# Patient Record
Sex: Female | Born: 1975 | Race: White | Hispanic: No | Marital: Married | Smoking: Never smoker
Health system: Southern US, Community
[De-identification: ages and names within clinical notes are randomized; demographics above are authoritative.]

## PROBLEM LIST (undated history)

## (undated) ENCOUNTER — Inpatient Hospital Stay (HOSPITAL_COMMUNITY): Payer: Self-pay

## (undated) DIAGNOSIS — Z8719 Personal history of other diseases of the digestive system: Secondary | ICD-10-CM

## (undated) DIAGNOSIS — R079 Chest pain, unspecified: Secondary | ICD-10-CM

## (undated) DIAGNOSIS — O88219 Thromboembolism in pregnancy, unspecified trimester: Secondary | ICD-10-CM

## (undated) DIAGNOSIS — G901 Familial dysautonomia [Riley-Day]: Secondary | ICD-10-CM

## (undated) DIAGNOSIS — Z87442 Personal history of urinary calculi: Secondary | ICD-10-CM

## (undated) DIAGNOSIS — R42 Dizziness and giddiness: Secondary | ICD-10-CM

## (undated) DIAGNOSIS — Z9889 Other specified postprocedural states: Secondary | ICD-10-CM

## (undated) DIAGNOSIS — K5792 Diverticulitis of intestine, part unspecified, without perforation or abscess without bleeding: Secondary | ICD-10-CM

## (undated) DIAGNOSIS — R110 Nausea: Secondary | ICD-10-CM

## (undated) DIAGNOSIS — G43909 Migraine, unspecified, not intractable, without status migrainosus: Secondary | ICD-10-CM

## (undated) DIAGNOSIS — K5090 Crohn's disease, unspecified, without complications: Secondary | ICD-10-CM

## (undated) DIAGNOSIS — F419 Anxiety disorder, unspecified: Secondary | ICD-10-CM

## (undated) DIAGNOSIS — R002 Palpitations: Secondary | ICD-10-CM

## (undated) DIAGNOSIS — R112 Nausea with vomiting, unspecified: Secondary | ICD-10-CM

## (undated) DIAGNOSIS — K219 Gastro-esophageal reflux disease without esophagitis: Secondary | ICD-10-CM

## (undated) DIAGNOSIS — K599 Functional intestinal disorder, unspecified: Secondary | ICD-10-CM

## (undated) DIAGNOSIS — D649 Anemia, unspecified: Secondary | ICD-10-CM

## (undated) DIAGNOSIS — I10 Essential (primary) hypertension: Secondary | ICD-10-CM

## (undated) HISTORY — DX: Essential (primary) hypertension: I10

## (undated) HISTORY — PX: TONSILLECTOMY: SUR1361

## (undated) HISTORY — PX: ILEOCECETOMY: SHX5857

## (undated) HISTORY — PX: COLON SURGERY: SHX602

## (undated) HISTORY — DX: Diverticulitis of intestine, part unspecified, without perforation or abscess without bleeding: K57.92

## (undated) HISTORY — PX: NASAL SINUS SURGERY: SHX719

## (undated) HISTORY — PX: APPENDECTOMY: SHX54

## (undated) HISTORY — PX: CYSTOSCOPY W/ STONE MANIPULATION: SHX1427

## (undated) HISTORY — PX: CARDIAC CATHETERIZATION: SHX172

## (undated) HISTORY — DX: Functional intestinal disorder, unspecified: K59.9

## (undated) HISTORY — DX: Crohn's disease, unspecified, without complications: K50.90

## (undated) HISTORY — PX: KNEE ARTHROSCOPY: SHX127

## (undated) HISTORY — DX: Familial dysautonomia (riley-day): G90.1

## (undated) HISTORY — DX: Palpitations: R00.2

---

## 1998-12-24 DIAGNOSIS — K5090 Crohn's disease, unspecified, without complications: Secondary | ICD-10-CM

## 1998-12-24 HISTORY — DX: Crohn's disease, unspecified, without complications: K50.90

## 1999-05-18 ENCOUNTER — Other Ambulatory Visit: Admission: RE | Admit: 1999-05-18 | Discharge: 1999-05-18 | Payer: Self-pay | Admitting: Obstetrics and Gynecology

## 2000-11-25 ENCOUNTER — Other Ambulatory Visit: Admission: RE | Admit: 2000-11-25 | Discharge: 2000-11-25 | Payer: Self-pay | Admitting: Obstetrics and Gynecology

## 2000-11-28 ENCOUNTER — Observation Stay (HOSPITAL_COMMUNITY): Admission: RE | Admit: 2000-11-28 | Discharge: 2000-11-29 | Payer: Self-pay | Admitting: Obstetrics and Gynecology

## 2000-12-24 HISTORY — PX: PARTIAL COLECTOMY: SHX5273

## 2001-03-05 ENCOUNTER — Encounter (INDEPENDENT_AMBULATORY_CARE_PROVIDER_SITE_OTHER): Payer: Self-pay | Admitting: Specialist

## 2001-03-05 ENCOUNTER — Ambulatory Visit (HOSPITAL_COMMUNITY): Admission: RE | Admit: 2001-03-05 | Discharge: 2001-03-05 | Payer: Self-pay | Admitting: Gastroenterology

## 2001-03-24 ENCOUNTER — Encounter (HOSPITAL_COMMUNITY): Admission: RE | Admit: 2001-03-24 | Discharge: 2001-06-22 | Payer: Self-pay | Admitting: Gastroenterology

## 2001-04-07 ENCOUNTER — Encounter: Payer: Self-pay | Admitting: Gastroenterology

## 2001-04-07 ENCOUNTER — Ambulatory Visit (HOSPITAL_COMMUNITY): Admission: RE | Admit: 2001-04-07 | Discharge: 2001-04-07 | Payer: Self-pay | Admitting: Gastroenterology

## 2001-04-15 ENCOUNTER — Encounter (INDEPENDENT_AMBULATORY_CARE_PROVIDER_SITE_OTHER): Payer: Self-pay | Admitting: Specialist

## 2001-04-15 ENCOUNTER — Inpatient Hospital Stay (HOSPITAL_COMMUNITY): Admission: RE | Admit: 2001-04-15 | Discharge: 2001-04-25 | Payer: Self-pay | Admitting: Gastroenterology

## 2001-04-17 ENCOUNTER — Encounter: Payer: Self-pay | Admitting: Surgery

## 2001-04-28 ENCOUNTER — Encounter: Admission: RE | Admit: 2001-04-28 | Discharge: 2001-04-28 | Payer: Self-pay | Admitting: Surgery

## 2001-04-28 ENCOUNTER — Encounter: Payer: Self-pay | Admitting: Surgery

## 2001-04-28 ENCOUNTER — Emergency Department (HOSPITAL_COMMUNITY): Admission: EM | Admit: 2001-04-28 | Discharge: 2001-04-28 | Payer: Self-pay | Admitting: Emergency Medicine

## 2001-06-21 ENCOUNTER — Inpatient Hospital Stay (HOSPITAL_COMMUNITY): Admission: RE | Admit: 2001-06-21 | Discharge: 2001-06-27 | Payer: Self-pay | Admitting: Gastroenterology

## 2001-06-22 ENCOUNTER — Encounter: Payer: Self-pay | Admitting: Gastroenterology

## 2001-06-24 ENCOUNTER — Encounter: Payer: Self-pay | Admitting: Gastroenterology

## 2001-06-27 ENCOUNTER — Encounter: Payer: Self-pay | Admitting: Gastroenterology

## 2001-08-14 ENCOUNTER — Ambulatory Visit (HOSPITAL_COMMUNITY): Admission: RE | Admit: 2001-08-14 | Discharge: 2001-08-14 | Payer: Self-pay | Admitting: Obstetrics and Gynecology

## 2001-08-14 ENCOUNTER — Encounter: Payer: Self-pay | Admitting: Obstetrics and Gynecology

## 2001-08-17 ENCOUNTER — Encounter: Payer: Self-pay | Admitting: Emergency Medicine

## 2001-08-17 ENCOUNTER — Emergency Department (HOSPITAL_COMMUNITY): Admission: EM | Admit: 2001-08-17 | Discharge: 2001-08-17 | Payer: Self-pay | Admitting: Emergency Medicine

## 2001-09-11 ENCOUNTER — Ambulatory Visit (HOSPITAL_COMMUNITY): Admission: RE | Admit: 2001-09-11 | Discharge: 2001-09-11 | Payer: Self-pay | Admitting: Gastroenterology

## 2001-09-15 ENCOUNTER — Emergency Department (HOSPITAL_COMMUNITY): Admission: EM | Admit: 2001-09-15 | Discharge: 2001-09-15 | Payer: Self-pay | Admitting: Emergency Medicine

## 2001-09-23 ENCOUNTER — Encounter (INDEPENDENT_AMBULATORY_CARE_PROVIDER_SITE_OTHER): Payer: Self-pay | Admitting: Specialist

## 2001-09-23 ENCOUNTER — Inpatient Hospital Stay (HOSPITAL_COMMUNITY): Admission: RE | Admit: 2001-09-23 | Discharge: 2001-10-01 | Payer: Self-pay

## 2002-01-04 ENCOUNTER — Emergency Department (HOSPITAL_COMMUNITY): Admission: EM | Admit: 2002-01-04 | Discharge: 2002-01-05 | Payer: Self-pay | Admitting: Emergency Medicine

## 2002-01-04 ENCOUNTER — Encounter: Payer: Self-pay | Admitting: Emergency Medicine

## 2002-10-02 ENCOUNTER — Other Ambulatory Visit: Admission: RE | Admit: 2002-10-02 | Discharge: 2002-10-02 | Payer: Self-pay | Admitting: Obstetrics and Gynecology

## 2003-02-24 ENCOUNTER — Ambulatory Visit (HOSPITAL_COMMUNITY): Admission: RE | Admit: 2003-02-24 | Discharge: 2003-02-24 | Payer: Self-pay | Admitting: Gastroenterology

## 2003-03-03 ENCOUNTER — Inpatient Hospital Stay (HOSPITAL_COMMUNITY): Admission: EM | Admit: 2003-03-03 | Discharge: 2003-03-06 | Payer: Self-pay | Admitting: Emergency Medicine

## 2003-03-03 ENCOUNTER — Encounter: Payer: Self-pay | Admitting: Internal Medicine

## 2003-03-04 ENCOUNTER — Encounter: Payer: Self-pay | Admitting: Internal Medicine

## 2003-03-12 ENCOUNTER — Encounter: Payer: Self-pay | Admitting: Internal Medicine

## 2003-03-12 ENCOUNTER — Ambulatory Visit (HOSPITAL_COMMUNITY): Admission: RE | Admit: 2003-03-12 | Discharge: 2003-03-12 | Payer: Self-pay | Admitting: Internal Medicine

## 2003-04-07 ENCOUNTER — Ambulatory Visit (HOSPITAL_COMMUNITY): Admission: RE | Admit: 2003-04-07 | Discharge: 2003-04-07 | Payer: Self-pay | Admitting: *Deleted

## 2003-05-13 ENCOUNTER — Encounter: Payer: Self-pay | Admitting: Gastroenterology

## 2003-05-13 ENCOUNTER — Encounter: Admission: RE | Admit: 2003-05-13 | Discharge: 2003-05-13 | Payer: Self-pay | Admitting: Gastroenterology

## 2003-05-24 ENCOUNTER — Encounter: Payer: Self-pay | Admitting: Emergency Medicine

## 2003-05-24 ENCOUNTER — Emergency Department (HOSPITAL_COMMUNITY): Admission: EM | Admit: 2003-05-24 | Discharge: 2003-05-24 | Payer: Self-pay | Admitting: Emergency Medicine

## 2003-05-25 ENCOUNTER — Inpatient Hospital Stay (HOSPITAL_COMMUNITY): Admission: EM | Admit: 2003-05-25 | Discharge: 2003-05-27 | Payer: Self-pay | Admitting: *Deleted

## 2003-05-25 ENCOUNTER — Encounter: Payer: Self-pay | Admitting: Emergency Medicine

## 2003-05-25 ENCOUNTER — Encounter: Payer: Self-pay | Admitting: Gastroenterology

## 2003-06-02 ENCOUNTER — Encounter: Payer: Self-pay | Admitting: Urology

## 2003-06-02 ENCOUNTER — Ambulatory Visit (HOSPITAL_BASED_OUTPATIENT_CLINIC_OR_DEPARTMENT_OTHER): Admission: RE | Admit: 2003-06-02 | Discharge: 2003-06-02 | Payer: Self-pay | Admitting: Urology

## 2003-06-05 ENCOUNTER — Encounter: Payer: Self-pay | Admitting: Emergency Medicine

## 2003-06-05 ENCOUNTER — Observation Stay (HOSPITAL_COMMUNITY): Admission: EM | Admit: 2003-06-05 | Discharge: 2003-06-06 | Payer: Self-pay | Admitting: Emergency Medicine

## 2003-06-16 ENCOUNTER — Encounter (HOSPITAL_COMMUNITY): Admission: RE | Admit: 2003-06-16 | Discharge: 2003-09-14 | Payer: Self-pay | Admitting: Gastroenterology

## 2003-06-18 ENCOUNTER — Inpatient Hospital Stay (HOSPITAL_COMMUNITY): Admission: EM | Admit: 2003-06-18 | Discharge: 2003-06-20 | Payer: Self-pay | Admitting: Emergency Medicine

## 2003-06-18 ENCOUNTER — Encounter: Payer: Self-pay | Admitting: *Deleted

## 2004-01-06 ENCOUNTER — Encounter: Admission: RE | Admit: 2004-01-06 | Discharge: 2004-01-06 | Payer: Self-pay | Admitting: Gastroenterology

## 2004-01-21 ENCOUNTER — Other Ambulatory Visit: Admission: RE | Admit: 2004-01-21 | Discharge: 2004-01-21 | Payer: Self-pay | Admitting: Obstetrics and Gynecology

## 2004-02-22 ENCOUNTER — Encounter: Admission: RE | Admit: 2004-02-22 | Discharge: 2004-02-22 | Payer: Self-pay | Admitting: Obstetrics and Gynecology

## 2004-05-24 ENCOUNTER — Encounter: Admission: RE | Admit: 2004-05-24 | Discharge: 2004-05-24 | Payer: Self-pay | Admitting: Gastroenterology

## 2004-12-12 ENCOUNTER — Emergency Department (HOSPITAL_COMMUNITY): Admission: EM | Admit: 2004-12-12 | Discharge: 2004-12-12 | Payer: Self-pay | Admitting: Emergency Medicine

## 2005-04-05 ENCOUNTER — Other Ambulatory Visit: Admission: RE | Admit: 2005-04-05 | Discharge: 2005-04-05 | Payer: Self-pay | Admitting: Obstetrics and Gynecology

## 2005-04-24 ENCOUNTER — Ambulatory Visit (HOSPITAL_COMMUNITY): Admission: RE | Admit: 2005-04-24 | Discharge: 2005-04-24 | Payer: Self-pay | Admitting: Internal Medicine

## 2005-07-05 ENCOUNTER — Inpatient Hospital Stay (HOSPITAL_COMMUNITY): Admission: EM | Admit: 2005-07-05 | Discharge: 2005-07-10 | Payer: Self-pay | Admitting: Emergency Medicine

## 2006-04-09 ENCOUNTER — Encounter: Admission: RE | Admit: 2006-04-09 | Discharge: 2006-04-09 | Payer: Self-pay | Admitting: Gastroenterology

## 2006-04-30 ENCOUNTER — Encounter: Admission: RE | Admit: 2006-04-30 | Discharge: 2006-04-30 | Payer: Self-pay | Admitting: Gastroenterology

## 2007-01-24 ENCOUNTER — Ambulatory Visit (HOSPITAL_COMMUNITY): Admission: RE | Admit: 2007-01-24 | Discharge: 2007-01-24 | Payer: Self-pay | Admitting: Orthopedic Surgery

## 2007-08-31 ENCOUNTER — Inpatient Hospital Stay (HOSPITAL_COMMUNITY): Admission: EM | Admit: 2007-08-31 | Discharge: 2007-09-04 | Payer: Self-pay | Admitting: Emergency Medicine

## 2007-09-03 ENCOUNTER — Encounter (INDEPENDENT_AMBULATORY_CARE_PROVIDER_SITE_OTHER): Payer: Self-pay | Admitting: Gastroenterology

## 2007-10-20 ENCOUNTER — Encounter: Admission: RE | Admit: 2007-10-20 | Discharge: 2007-10-20 | Payer: Self-pay | Admitting: Gastroenterology

## 2007-10-30 ENCOUNTER — Encounter: Admission: RE | Admit: 2007-10-30 | Discharge: 2007-10-30 | Payer: Self-pay | Admitting: Gastroenterology

## 2008-01-19 ENCOUNTER — Ambulatory Visit (HOSPITAL_COMMUNITY): Admission: RE | Admit: 2008-01-19 | Discharge: 2008-01-19 | Payer: Self-pay | Admitting: Neurology

## 2008-01-22 ENCOUNTER — Encounter: Admission: RE | Admit: 2008-01-22 | Discharge: 2008-01-22 | Payer: Self-pay | Admitting: Neurology

## 2008-01-28 ENCOUNTER — Encounter: Admission: RE | Admit: 2008-01-28 | Discharge: 2008-01-28 | Payer: Self-pay | Admitting: Neurology

## 2008-03-10 ENCOUNTER — Encounter: Admission: RE | Admit: 2008-03-10 | Discharge: 2008-03-10 | Payer: Self-pay | Admitting: Neurology

## 2008-06-13 ENCOUNTER — Encounter: Admission: RE | Admit: 2008-06-13 | Discharge: 2008-06-13 | Payer: Self-pay | Admitting: Neurology

## 2008-06-30 ENCOUNTER — Encounter: Admission: RE | Admit: 2008-06-30 | Discharge: 2008-06-30 | Payer: Self-pay | Admitting: Orthopedic Surgery

## 2009-06-09 ENCOUNTER — Encounter: Admission: RE | Admit: 2009-06-09 | Discharge: 2009-06-09 | Payer: Self-pay | Admitting: Internal Medicine

## 2009-08-14 ENCOUNTER — Emergency Department (HOSPITAL_COMMUNITY): Admission: EM | Admit: 2009-08-14 | Discharge: 2009-08-14 | Payer: Self-pay | Admitting: Emergency Medicine

## 2009-08-15 ENCOUNTER — Emergency Department (HOSPITAL_COMMUNITY): Admission: EM | Admit: 2009-08-15 | Discharge: 2009-08-16 | Payer: Self-pay | Admitting: Emergency Medicine

## 2009-08-15 ENCOUNTER — Ambulatory Visit (HOSPITAL_COMMUNITY): Admission: RE | Admit: 2009-08-15 | Discharge: 2009-08-15 | Payer: Self-pay | Admitting: Gastroenterology

## 2009-08-17 ENCOUNTER — Ambulatory Visit (HOSPITAL_COMMUNITY): Admission: RE | Admit: 2009-08-17 | Discharge: 2009-08-17 | Payer: Self-pay | Admitting: Emergency Medicine

## 2009-11-11 HISTORY — PX: US ECHOCARDIOGRAPHY: HXRAD669

## 2009-11-23 HISTORY — PX: LOOP RECORDER IMPLANT: SHX5954

## 2009-12-01 ENCOUNTER — Ambulatory Visit (HOSPITAL_COMMUNITY): Admission: RE | Admit: 2009-12-01 | Discharge: 2009-12-01 | Payer: Self-pay | Admitting: Neurology

## 2009-12-07 ENCOUNTER — Encounter: Admission: RE | Admit: 2009-12-07 | Discharge: 2009-12-07 | Payer: Self-pay | Admitting: Neurology

## 2009-12-13 ENCOUNTER — Ambulatory Visit (HOSPITAL_COMMUNITY): Admission: RE | Admit: 2009-12-13 | Discharge: 2009-12-13 | Payer: Self-pay | Admitting: Cardiology

## 2010-02-07 ENCOUNTER — Inpatient Hospital Stay (HOSPITAL_COMMUNITY): Admission: AD | Admit: 2010-02-07 | Discharge: 2010-02-11 | Payer: Self-pay | Admitting: Obstetrics and Gynecology

## 2010-02-22 ENCOUNTER — Inpatient Hospital Stay (HOSPITAL_COMMUNITY): Admission: AD | Admit: 2010-02-22 | Discharge: 2010-02-22 | Payer: Self-pay | Admitting: Obstetrics and Gynecology

## 2010-02-25 ENCOUNTER — Inpatient Hospital Stay (HOSPITAL_COMMUNITY): Admission: AD | Admit: 2010-02-25 | Discharge: 2010-02-25 | Payer: Self-pay | Admitting: Obstetrics & Gynecology

## 2010-03-24 ENCOUNTER — Ambulatory Visit (HOSPITAL_COMMUNITY): Admission: RE | Admit: 2010-03-24 | Discharge: 2010-03-24 | Payer: Self-pay | Admitting: Obstetrics and Gynecology

## 2010-04-19 ENCOUNTER — Ambulatory Visit (HOSPITAL_COMMUNITY): Admission: RE | Admit: 2010-04-19 | Discharge: 2010-04-19 | Payer: Self-pay | Admitting: Obstetrics and Gynecology

## 2010-05-26 ENCOUNTER — Emergency Department (HOSPITAL_COMMUNITY): Admission: EM | Admit: 2010-05-26 | Discharge: 2010-05-26 | Payer: Self-pay | Admitting: Emergency Medicine

## 2010-06-07 ENCOUNTER — Ambulatory Visit (HOSPITAL_COMMUNITY): Admission: RE | Admit: 2010-06-07 | Discharge: 2010-06-07 | Payer: Self-pay | Admitting: Obstetrics & Gynecology

## 2010-06-09 ENCOUNTER — Ambulatory Visit (HOSPITAL_COMMUNITY): Admission: RE | Admit: 2010-06-09 | Discharge: 2010-06-09 | Payer: Self-pay | Admitting: Obstetrics & Gynecology

## 2010-06-13 ENCOUNTER — Inpatient Hospital Stay (HOSPITAL_COMMUNITY): Admission: AD | Admit: 2010-06-13 | Discharge: 2010-07-21 | Payer: Self-pay | Admitting: Obstetrics and Gynecology

## 2010-06-15 ENCOUNTER — Encounter: Payer: Self-pay | Admitting: Obstetrics & Gynecology

## 2010-06-16 ENCOUNTER — Encounter: Payer: Self-pay | Admitting: Obstetrics & Gynecology

## 2010-06-19 ENCOUNTER — Encounter: Payer: Self-pay | Admitting: Obstetrics and Gynecology

## 2010-06-21 ENCOUNTER — Encounter: Payer: Self-pay | Admitting: Obstetrics and Gynecology

## 2010-06-23 ENCOUNTER — Encounter: Payer: Self-pay | Admitting: Obstetrics and Gynecology

## 2010-06-27 ENCOUNTER — Encounter: Payer: Self-pay | Admitting: Obstetrics and Gynecology

## 2010-06-30 ENCOUNTER — Encounter: Payer: Self-pay | Admitting: Obstetrics and Gynecology

## 2010-07-04 ENCOUNTER — Encounter: Payer: Self-pay | Admitting: Obstetrics and Gynecology

## 2010-07-07 ENCOUNTER — Encounter: Payer: Self-pay | Admitting: Obstetrics and Gynecology

## 2010-07-11 ENCOUNTER — Encounter: Payer: Self-pay | Admitting: Obstetrics and Gynecology

## 2010-07-14 ENCOUNTER — Encounter: Payer: Self-pay | Admitting: Obstetrics and Gynecology

## 2010-07-18 ENCOUNTER — Encounter: Payer: Self-pay | Admitting: Obstetrics and Gynecology

## 2010-07-21 ENCOUNTER — Encounter: Payer: Self-pay | Admitting: Obstetrics & Gynecology

## 2010-08-17 ENCOUNTER — Ambulatory Visit: Payer: Self-pay | Admitting: Cardiovascular Disease

## 2010-10-02 ENCOUNTER — Ambulatory Visit: Admission: RE | Admit: 2010-10-02 | Discharge: 2010-10-02 | Payer: Self-pay | Admitting: Obstetrics and Gynecology

## 2010-11-10 ENCOUNTER — Ambulatory Visit: Payer: Self-pay | Admitting: Internal Medicine

## 2010-11-10 DIAGNOSIS — R55 Syncope and collapse: Secondary | ICD-10-CM | POA: Insufficient documentation

## 2010-12-24 HISTORY — PX: ENDOSCOPIC RELEASE TRANSVERSE CARPAL LIGAMENT OF HAND: SUR444

## 2011-01-02 ENCOUNTER — Encounter: Payer: Self-pay | Admitting: Internal Medicine

## 2011-01-02 ENCOUNTER — Ambulatory Visit: Admission: RE | Admit: 2011-01-02 | Discharge: 2011-01-02 | Payer: Self-pay | Source: Home / Self Care

## 2011-01-14 ENCOUNTER — Encounter: Payer: Self-pay | Admitting: Obstetrics & Gynecology

## 2011-01-14 ENCOUNTER — Encounter: Payer: Self-pay | Admitting: Obstetrics and Gynecology

## 2011-01-23 NOTE — Cardiovascular Report (Signed)
Summary: Office Visit   Office Visit   Imported By: Sallee Provencal 11/10/2010 16:40:25  _____________________________________________________________________  External Attachment:    Type:   Image     Comment:   External Document

## 2011-01-23 NOTE — Assessment & Plan Note (Signed)
Summary: ILR   Visit Type:  New Patient Referring Provider:  Dr Acie Fredrickson   History of Present Illness: [name] is a pleasant 35 yo WF with recurrent unexplained syncope who presents today to establish EP follow-up of her ILR implanted by Dr Doreatha Lew 12-12-23.  She reports prior episodes of syncope but none since her monitor has been implanted.  She does not recall triggers for her syncope.  She has found that she typically has a prodrome of feeling "weak" and that if she sits down and puts her head between her legs that episodes will typically pass.  She denies abrupt LOC.  She also denies symptoms of palpitations, chest pain, shortness of breath, orthopnea, PND, lower extremity edema, dizziness, or neurologic sequela.  She was pregnant during this year and predominantly had difficulty with hyperemesis.  This has resolved since delivery.  The patient is tolerating medications without difficulties and is otherwise without complaint today.   Current Medications (verified): 1)  Prenatabs Fa  Tabs (Prenatal Vit-Fe Fumarate-Fa) .... 2 Qhs 2)  Ambien 10 Mg Tabs (Zolpidem Tartrate) .... At Bedtime 3)  Zyrtec Hives Relief 10 Mg Tabs (Cetirizine Hcl) .... At Bedtime 4)  Pepcid Ac 10 Mg Tabs (Famotidine) .... At Bedtime  Allergies: 1)  ! Sulfa 2)  ! Codeine 3)  ! Phenergan 4)  ! Biaxin  Past History:  Past Medical History:  Asthma  Endometriosis  History of Crohn disease since 2000.  She has had surgical resection x2.   Question of a seizure disorder, but with recent negative EEG.   Seasonal allergies  Recurrent unexplained syncope s/p ILR  Past Surgical History: Cysto, retrograde pyelogram, left ureteroscopy with removal of stone and insertion of left ureteral stent. ileocolectomy ILR by Dr Doreatha Lew 12/12/2023  Family History: Father died at 55 due to a heart attack.  He did have a history of diabetes.    Her mother is 77 and has hypertension.   Social History: Reviewed history from 11/09/2010  and no changes required. She is married.   She was formally a Education officer, museum.  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   She does not smoke.   She does have occasional wine.   Review of Systems       All systems are reviewed and negative except as listed in the HPI.   Vital Signs:  Patient profile:   35 year old female Height:      64 inches Weight:      140 pounds BMI:     24.12 Pulse rate:   56 / minute BP sitting:   115 / 83  (left arm)  Vitals Entered By: [name] CMA (November 10, 2010 10:28 AM)  Physical Exam  General:  Well developed, well nourished, in no acute distress. Head:  normocephalic and atraumatic Eyes:  PERRLA/EOM intact; conjunctiva and lids normal. Mouth:  Teeth, gums and palate normal. Oral mucosa normal. Neck:  Neck supple, no JVD. No masses, thyromegaly or abnormal cervical nodes. Chest Wall:  ILR pocket is well healed Lungs:  Clear bilaterally to auscultation and percussion. Heart:  Non-displaced PMI, chest non-tender; regular rate and rhythm, S1, S2 without murmurs, rubs or gallops. Carotid upstroke normal, no bruit. Normal abdominal aortic size, no bruits. Femorals normal pulses, no bruits. Pedals normal pulses. No edema, no varicosities. Abdomen:  Bowel sounds positive; abdomen soft and non-tender without masses, organomegaly, or hernias noted. No hepatosplenomegaly. Msk:  R wrist in a soft brace (tendonitis  per pt) Extremities:  No clubbing or cyanosis. Neurologic:  Alert and oriented x 3.   EKG  Procedure date:  11/10/2010  Findings:      sinus rhythm 84 bpm short PR (PR 106) without obvious pre-excitation QTc 458 otherwise normal ekg   ILR Following MD Thompson Grayer, MD Vendor:  [place] Number:  [phone]     Serial Number [id number]       Tachy Episodes:  5     Brady Episodes:  0 ILR Next Due 01/24/2011  Tech Comments:  VT episodes probable sinus tachy.  ROV 3 months clinic. Alma Friendly, LPN   November 10, 1517 10:37 AM   MD Comments:  agree,  she had several episodes of narrow complex tachycardia (HR 180s) with QRS morphology similar to sinus, without symptoms to correlate.  This could be sinus tachy or SVT.  Impression & Recommendations:  Problem # 1:  SYNCOPE, HX OF (ICD-V12.49) By history,  she likely has dysautonomia as the cause for her syncope.  She has had no further episodes with lifestyle modification.  Her ILR today reveals either sinus tach or SVT but no worrisome episodes.  No changes are therefore made today. She will follow-up with Dr Acie Fredrickson and in the device clinic. Adequate hydration was encouraged today.

## 2011-01-23 NOTE — Assessment & Plan Note (Signed)
    Patient Instructions: 1)  Your physician recommends that you schedule a follow-up appointment in: 3 months in the device clinic   Appended Document:  short PR on ecg without obvious pre-excitation we will follow ILR

## 2011-01-25 NOTE — Procedures (Signed)
Summary: device check/sl   Current Medications (verified): 1)  Prenatabs Fa  Tabs (Prenatal Vit-Fe Fumarate-Fa) .... 2 Qhs 2)  Ambien 10 Mg Tabs (Zolpidem Tartrate) .... At Bedtime 3)  Zyrtec Hives Relief 10 Mg Tabs (Cetirizine Hcl) .... At Bedtime 4)  Pepcid Ac 10 Mg Tabs (Famotidine) .... At Bedtime  Allergies (verified): 1)  ! Sulfa 2)  ! Codeine 3)  ! Phenergan 4)  ! Biaxin    ILR Following MD Thompson Grayer, MD Vendor:  [employer]     Model Number:  [id number]     Serial Number [id number]       Tachy Episodes:  0     Brady Episodes:  0  Tech Comments:  1 patient activated episode recorded after near syncopal episode, heart rate of 140bpm noted.  Will follow up as schedulled. Alma Friendly, LPN  January 03, 220 10:18 AM   MD Comments:  appers to be sinus tach

## 2011-01-25 NOTE — Cardiovascular Report (Signed)
Summary: Office Visit   Office Visit   Imported By: Sallee Provencal 01/15/2011 11:01:33  _____________________________________________________________________  External Attachment:    Type:   Image     Comment:   External Document

## 2011-03-10 LAB — LACTATE DEHYDROGENASE: LDH: 122 U/L (ref 94–250)

## 2011-03-10 LAB — CBC
HCT: 31.3 % — ABNORMAL LOW (ref 36.0–46.0)
Hemoglobin: 10.6 g/dL — ABNORMAL LOW (ref 12.0–15.0)
MCH: 30.5 pg (ref 26.0–34.0)
MCHC: 33.9 g/dL (ref 30.0–36.0)

## 2011-03-10 LAB — HEMOGLOBIN A1C

## 2011-03-10 LAB — URINALYSIS, DIPSTICK ONLY
Bilirubin Urine: NEGATIVE
Leukocytes, UA: NEGATIVE
Nitrite: NEGATIVE
Specific Gravity, Urine: >1.03 — ABNORMAL HIGH (ref 1.005–1.030)
pH: 6 (ref 5.0–8.0)

## 2011-03-10 LAB — COMPREHENSIVE METABOLIC PANEL
BUN: 6 mg/dL (ref 6–23)
Calcium: 8.4 mg/dL (ref 8.4–10.5)
Glucose, Bld: 81 mg/dL (ref 70–99)
Total Protein: 5.5 g/dL — ABNORMAL LOW (ref 6.0–8.3)

## 2011-03-10 LAB — URINALYSIS, ROUTINE W REFLEX MICROSCOPIC
Bilirubin Urine: NEGATIVE
Glucose, UA: NEGATIVE mg/dL
Ketones, ur: NEGATIVE mg/dL
Nitrite: NEGATIVE
pH: 7.5 (ref 5.0–8.0)

## 2011-03-10 LAB — URIC ACID: Uric Acid, Serum: 4.5 mg/dL (ref 2.4–7.0)

## 2011-03-11 LAB — BASIC METABOLIC PANEL
Calcium: 8.5 mg/dL (ref 8.4–10.5)
GFR calc Af Amer: >60 mL/min (ref >60)
GFR calc non Af Amer: >60 mL/min (ref >60)
Glucose, Bld: 97 mg/dL (ref 70–99)
Potassium: 3.7 mEq/L (ref 3.5–5.1)
Sodium: 134 mEq/L — ABNORMAL LOW (ref 135–145)

## 2011-03-11 LAB — URINALYSIS, ROUTINE W REFLEX MICROSCOPIC
Nitrite: NEGATIVE
Specific Gravity, Urine: 1.01 (ref 1.005–1.030)
Urobilinogen, UA: 0.2 mg/dL (ref 0.0–1.0)
pH: 6.5 (ref 5.0–8.0)

## 2011-03-11 LAB — STREP B DNA PROBE: Strep Group B Ag: POSITIVE

## 2011-03-14 LAB — URINALYSIS, ROUTINE W REFLEX MICROSCOPIC
Bilirubin Urine: NEGATIVE
Glucose, UA: NEGATIVE mg/dL
Hgb urine dipstick: NEGATIVE
Protein, ur: NEGATIVE mg/dL
Urobilinogen, UA: 0.2 mg/dL (ref 0.0–1.0)

## 2011-03-14 LAB — COMPREHENSIVE METABOLIC PANEL
ALT: 36 U/L — ABNORMAL HIGH (ref 0–35)
ALT: 44 U/L — ABNORMAL HIGH (ref 0–35)
ALT: 56 U/L — ABNORMAL HIGH (ref 0–35)
AST: 24 U/L (ref 0–37)
Albumin: 2.6 g/dL — ABNORMAL LOW (ref 3.5–5.2)
Albumin: 2.7 g/dL — ABNORMAL LOW (ref 3.5–5.2)
Albumin: 3.5 g/dL (ref 3.5–5.2)
Alkaline Phosphatase: 51 U/L (ref 39–117)
Alkaline Phosphatase: 58 U/L (ref 39–117)
Alkaline Phosphatase: 70 U/L (ref 39–117)
BUN: 6 mg/dL (ref 6–23)
CO2: 22 mEq/L (ref 19–32)
CO2: 23 mEq/L (ref 19–32)
Chloride: 108 mEq/L (ref 96–112)
Chloride: 110 mEq/L (ref 96–112)
Creatinine, Ser: 0.6 mg/dL (ref 0.4–1.2)
GFR calc Af Amer: >60 mL/min (ref >60)
GFR calc Af Amer: >60 mL/min (ref >60)
Glucose, Bld: 81 mg/dL (ref 70–99)
Potassium: 3.5 mEq/L (ref 3.5–5.1)
Potassium: 3.9 mEq/L (ref 3.5–5.1)
Sodium: 135 mEq/L (ref 135–145)
Sodium: 136 mEq/L (ref 135–145)
Total Bilirubin: 0.6 mg/dL (ref 0.3–1.2)
Total Bilirubin: 0.6 mg/dL (ref 0.3–1.2)
Total Bilirubin: 0.6 mg/dL (ref 0.3–1.2)
Total Protein: 6.3 g/dL (ref 6.0–8.3)

## 2011-03-14 LAB — DIFFERENTIAL
Basophils Absolute: 0 10*3/uL (ref 0.0–0.1)
Basophils Absolute: 0 10*3/uL (ref 0.0–0.1)
Basophils Relative: 0 % (ref 0–1)
Basophils Relative: 0 % (ref 0–1)
Eosinophils Absolute: 0 10*3/uL (ref 0.0–0.7)
Eosinophils Absolute: 0 10*3/uL (ref 0.0–0.7)
Eosinophils Relative: 0 % (ref 0–5)
Monocytes Absolute: 0.4 10*3/uL (ref 0.1–1.0)
Monocytes Absolute: 0.8 10*3/uL (ref 0.1–1.0)
Monocytes Relative: 6 % (ref 3–12)
Neutro Abs: 6.3 10*3/uL (ref 1.7–7.7)

## 2011-03-14 LAB — CBC
HCT: 37 % (ref 36.0–46.0)
Hemoglobin: 12.5 g/dL (ref 12.0–15.0)
MCHC: 33.8 g/dL (ref 30.0–36.0)
MCV: 89.4 fL (ref 78.0–100.0)
Platelets: 300 10*3/uL (ref 150–400)
RBC: 3.65 MIL/uL — ABNORMAL LOW (ref 3.87–5.11)
RDW: 12.8 % (ref 11.5–15.5)
RDW: 12.9 % (ref 11.5–15.5)
WBC: 12.7 10*3/uL — ABNORMAL HIGH (ref 4.0–10.5)

## 2011-03-14 LAB — URINE MICROSCOPIC-ADD ON

## 2011-03-14 LAB — AMYLASE: Amylase: 27 U/L (ref 0–105)

## 2011-03-16 LAB — COMPREHENSIVE METABOLIC PANEL
ALT: 53 U/L — ABNORMAL HIGH (ref 0–35)
AST: 31 U/L (ref 0–37)
Alkaline Phosphatase: 88 U/L (ref 39–117)
CO2: 25 mEq/L (ref 19–32)
Calcium: 8.9 mg/dL (ref 8.4–10.5)
GFR calc Af Amer: >60 mL/min (ref >60)
GFR calc non Af Amer: >60 mL/min (ref >60)
Glucose, Bld: 79 mg/dL (ref 70–99)
Potassium: 3.8 mEq/L (ref 3.5–5.1)
Sodium: 135 mEq/L (ref 135–145)

## 2011-03-16 LAB — URINALYSIS, ROUTINE W REFLEX MICROSCOPIC
Bilirubin Urine: NEGATIVE
Glucose, UA: NEGATIVE mg/dL
Hgb urine dipstick: NEGATIVE
Ketones, ur: 40 mg/dL — AB
Nitrite: NEGATIVE
Protein, ur: 30 mg/dL — AB
Specific Gravity, Urine: >1.03 — ABNORMAL HIGH (ref 1.005–1.030)
Specific Gravity, Urine: >1.03 — ABNORMAL HIGH (ref 1.005–1.030)
Urobilinogen, UA: 0.2 mg/dL (ref 0.0–1.0)
pH: 5.5 (ref 5.0–8.0)

## 2011-03-16 LAB — URINE MICROSCOPIC-ADD ON

## 2011-03-21 ENCOUNTER — Other Ambulatory Visit: Payer: Self-pay | Admitting: Gastroenterology

## 2011-03-21 DIAGNOSIS — K5090 Crohn's disease, unspecified, without complications: Secondary | ICD-10-CM

## 2011-03-26 LAB — PREGNANCY, URINE: Preg Test, Ur: NEGATIVE

## 2011-03-27 ENCOUNTER — Other Ambulatory Visit: Payer: Self-pay

## 2011-03-27 ENCOUNTER — Ambulatory Visit
Admission: RE | Admit: 2011-03-27 | Discharge: 2011-03-27 | Disposition: A | Discharge status: Home / Self Care | Payer: BC Managed Care – PPO | Source: Ambulatory Visit | Attending: Gastroenterology | Admitting: Gastroenterology

## 2011-03-27 DIAGNOSIS — K5090 Crohn's disease, unspecified, without complications: Secondary | ICD-10-CM

## 2011-03-27 MED ORDER — IOHEXOL 300 MG/ML  SOLN
100.0000 mL | Freq: Once | INTRAMUSCULAR | Status: AC | PRN
  Administered 2011-03-27: 100 mL via INTRAVENOUS

## 2011-03-31 LAB — COMPREHENSIVE METABOLIC PANEL
ALT: 22 U/L (ref 0–35)
AST: 23 U/L (ref 0–37)
Albumin: 4.2 g/dL (ref 3.5–5.2)
Alkaline Phosphatase: 65 U/L (ref 39–117)
Calcium: 9.3 mg/dL (ref 8.4–10.5)
GFR calc Af Amer: >60 mL/min (ref >60)
Glucose, Bld: 93 mg/dL (ref 70–99)
Potassium: 3.9 mEq/L (ref 3.5–5.1)
Sodium: 141 mEq/L (ref 135–145)
Total Protein: 7.4 g/dL (ref 6.0–8.3)

## 2011-03-31 LAB — DIFFERENTIAL
Basophils Relative: 1 % (ref 0–1)
Eosinophils Absolute: 0 10*3/uL (ref 0.0–0.7)
Eosinophils Relative: 1 % (ref 0–5)
Lymphs Abs: 1.6 10*3/uL (ref 0.7–4.0)
Monocytes Absolute: 0.4 10*3/uL (ref 0.1–1.0)
Monocytes Relative: 7 % (ref 3–12)
Neutrophils Relative %: 67 % (ref 43–77)

## 2011-03-31 LAB — URINALYSIS, ROUTINE W REFLEX MICROSCOPIC
Bilirubin Urine: NEGATIVE
Glucose, UA: NEGATIVE mg/dL
Glucose, UA: NEGATIVE mg/dL
Hgb urine dipstick: NEGATIVE
Ketones, ur: NEGATIVE mg/dL
Protein, ur: NEGATIVE mg/dL
Protein, ur: NEGATIVE mg/dL
Specific Gravity, Urine: 1.029 (ref 1.005–1.030)
Urobilinogen, UA: 0.2 mg/dL (ref 0.0–1.0)
pH: 6 (ref 5.0–8.0)

## 2011-03-31 LAB — URINE CULTURE
Colony Count: 7000
Colony Count: NO GROWTH
Culture: NO GROWTH

## 2011-03-31 LAB — CBC
Hemoglobin: 14.3 g/dL (ref 12.0–15.0)
RBC: 4.64 MIL/uL (ref 3.87–5.11)
RDW: 12.8 % (ref 11.5–15.5)

## 2011-03-31 LAB — URINE MICROSCOPIC-ADD ON

## 2011-05-08 NOTE — Op Note (Signed)
NAMEMALLERY, HARSHMAN                 ACCOUNT NO.:  [id number]   MEDICAL RECORD NO.:  [id number]          PATIENT TYPE:  INP   LOCATION:  5036                         FACILITY:  [facility]   PHYSICIAN:  Wonda Horner, M.D.   DATE OF BIRTH:  03/31/1976   DATE OF PROCEDURE:  09/03/2007  DATE OF DISCHARGE:                               OPERATIVE REPORT   PROCEDURE:  Colonoscopy with biopsy.   INDICATIONS FOR PROCEDURE:  The patient is a 35 year old female with a  history of Crohn disease.  She is status post ileocecal resection into  2002.  She was admitted to the hospital because of abdominal pain.  CT  scan raised the question of an abnormality along the transverse colon.  It was suggested that the wall was thickened and that there could be  some stranding.  It was felt that this might be an exacerbation of  Crohn's.   Informed consent was obtained after explanation of the risks of  bleeding, infection and perforation.   PREMEDICATION:  Fentanyl 150 mcg IV, Versed 13 mg IV.   PROCEDURE:  With the patient in the left lateral decubitus position, a  rectal exam was performed.  No masses were felt.  The Pentax colonoscope  was inserted into the rectum and advanced around the colon to the  ileocolonic anastomosis.  The ileum in this area looked normal.  The  ileocolonic anastomosis looked normal.  The scope was brought out  visualizing the mucosa and the mucosa looked normal.  I saw no evidence  visibly to suggest that there was active Crohn disease going on.  Random  biopsies were obtained from the ileum as well as along the colon but  again the biopsies were obtained from what looked like normal-appearing  mucosa.  She tolerated the procedure well without complications.   IMPRESSION:  No obvious visible inflammatory changes in the colon or  small bowel on this examination           ______________________________  Wonda Horner, M.D.     SFG/MEDQ  D:  09/03/2007  T:  09/04/2007   Job:  [id number]   cc:   Jeneen Rinks L. Rolla Flatten., M.D.  Haywood Pao, M.D.

## 2011-05-08 NOTE — H&P (Signed)
NAME:  [name], [name]                 ACCOUNT NO.:  [id number]   MEDICAL RECORD NO.:  [id number]           PATIENT TYPE:   LOCATION:                                 FACILITY:   PHYSICIAN:  Wonda Horner, M.D.        DATE OF BIRTH:   DATE OF ADMISSION:  08/31/2007  DATE OF DISCHARGE:                              HISTORY & PHYSICAL   CHIEF COMPLAINT:  Crohn's disease with flareup, abdominal pain,  diarrhea.   HISTORY OF PRESENT ILLNESS:  The patient is a 35 year old white female  with history of Crohn's disease.  She states that this was diagnosed in  2000.  She has had two resections of her small bowel  and a few inches  of the colon in the past.  She was under good control for quite a long  time on 6-MP 50 mg daily. In the past she had taken prednisone as well  as Remicade, but she has only been a 6-MP recently.  She follows with  Dr. Oletta Lamas as her gastroenterologist but has not seen him in about a  year.   Five days ago she developed diarrhea and abdominal cramping which she  has not been able to control.  She called and spoke to me yesterday and  thought that she was having a flareup.  I called in some prednisone,  but, due to her persistent symptoms, she came to the emergency room. She  started seeing some blood this morning but not a great deal. She thinks  it might have been from going so much and wiping.  She has not been on  any recent antibiotics and has had no recent travel. [place] and swallowed lot of  lake water.   PAST HISTORY:  1. Asthma.  2. Endometriosis.  3. Surgery as mentioned above.   MEDICATIONS:  1. Ambien 10 mg nightly.  2. Compazine 10 mg p.r.n.  3. 6-MP 50 mg daily.  4. Advair Diskus once a day.  5. Recently started prednisone yesterday, 20 mg b.i.d.   ALLERGIES:  SULFA, CODEINE, PHENERGAN.   SOCIAL HISTORY:  Does not smoke, drinks occasional alcohol but not much.   REVIEW OF  SYSTEMS:  No high fevers.  No chest pain, shortness of breath,  cough, sputum production.   PHYSICAL EXAMINATION:  VITAL SIGNS:  Temperature  99, blood pressure  118/84, pulse 113.  GENERAL:  No distress.  HEENT:  Nonicteric.  NECK:  Supple.  HEART:  Regular rhythm.  No murmurs.  LUNGS:  Clear.  ABDOMEN:  Bowel sounds are present and sound normal.  Abdomen is soft.  There is some mild tenderness of the right lower quadrant.  No rebound.  EXTREMITIES:  Without edema   White count 8600, hemoglobin 13.9, hematocrit 41.1. Potassium 3.6, BUN  7.   IMPRESSION:  Crohn's disease with flareup.   PLAN:  1. Admit to the hospital.  2. Give IV hydration.  3. Will start IV Solu-Medrol.  4. She can  have clear liquids.  5. Will continue 6-MP.  6. Will check stool for enteric pathogens, O&P, and Clostridium      difficile toxin.           ______________________________  Wonda Horner, M.D.     SFG/MEDQ  D:  08/31/2007  T:  08/31/2007  Job:  [id number]   cc:   Jeneen Rinks L. Rolla Flatten., M.D.  Haywood Pao, M.D.

## 2011-05-08 NOTE — Discharge Summary (Signed)
[name], [name]                 ACCOUNT NO.:  [id number]   MEDICAL RECORD NO.:  [id number]          PATIENT TYPE:  INP   LOCATION:  5036                         FACILITY:  [facility]   PHYSICIAN:  Wonda Horner, M.D.   DATE OF BIRTH:  07/25/1976   DATE OF ADMISSION:  08/31/2007  DATE OF DISCHARGE:  09/04/2007                               DISCHARGE SUMMARY   PRIMARY DIAGNOSIS:  Crohn's versus gastroenteritis.   DISCHARGE DIAGNOSES:  1. Gastroenteritis.  2. Probable urinary tract infection.  3. Crohn's.  4. Asthma.  5. Endometriosis.  6. History of ileocecal resection.   CONSULTATION:  None.   PROCEDURES:  1. CT of the abdomen and pelvis done for abdominal pain on August 31, 2007, that showed inflammatory changes of the transverse colon.  2. Colonoscopy done on September 03, 2007, by Dr. Acquanetta Sit that      showed no visible inflammatory changes in the colon or the examined      part of the small-bowel.  3. CT of the abdomen and pelvis with and without contrast for back      pain, right flank pain that showed improvement in the inflammatory      changes of the transverse colon and no acute abnormalities.   HISTORY:  This is a 34 year old white female with a history of Crohn's  disease that was diagnosed in 2000.  She has had two resections of her  small bowel and a few inches of her colon in the past.  She has had good  control for quite a long time was 6-MP 50 mg daily.  She has also in the  past taken prednisone as well as Remicade.  She follows with Dr. Oletta Lamas  as her gastroenteritis and saw him last approximately one year ago.  On  or about August 26, 2007, she developed diarrhea and abdominal  cramping which she has been unable to control.  She came into the  emergency room and was admitted with a possible Crohn's flare up.  She  was started on IV Solu-Medrol.  Her stools were tested for C. difficile  colitis as well as O&P, Giardia and Cryptosporidium.  All  those were  negative.  Urinalysis was done that showed moderate leukocyte esterase.  The rest of her labs including CBC and CMP were within normal limits  with the exception of a glucose of 172.  She was treated with IV Solu-  Medrol, Cipro and Flagyl.  She started on clear liquids.  As her  diarrhea and her pain decreased, her diet was advanced.  She did begin  to complain of some back pain as well as nausea from the antibiotics.  Over the course of her hospital stay, her symptoms continued to improve  until she was tolerating a full diet and her diarrhea and pain had  resolved.   She was discharged to home today in good condition with follow-up  instructions to resume her 6-MP at 50 mg daily as well as the rest of  her preadmission medications which included Ambien, Advair  250/50,  Compazine and prednisone 20 mg twice a day.   She has a follow-up appointment with Dr. Laurence Spates on September 18, 2007, at 10:45 in the [facility].  She was instructed to call our  office with any increased pain, diarrhea or rectal bleeding.      Barbara Alar, PA    ______________________________  Wonda Horner, M.D.    MLY/MEDQ  D:  09/04/2007  T:  09/05/2007  Job:  [id number]   cc:   Jeneen Rinks L. Rolla Flatten., M.D.

## 2011-05-11 NOTE — H&P (Signed)
NAME:  [name], [name]                           ACCOUNT NO.:  [id number]   MEDICAL RECORD NO.:  [id number]                   PATIENT TYPE:  INP   LOCATION:  1824                                 FACILITY:  [facility]   PHYSICIAN:  James L. Rolla Flatten., M.D.          DATE OF BIRTH:  February 13, 1976   DATE OF ADMISSION:  05/25/2003  DATE OF DISCHARGE:                                HISTORY & PHYSICAL   REASON FOR ADMISSION:  Left abdominal and back pain in a young woman with  Crohn's.   HISTORY:  The patient is a nice young woman in her late 7s who has Crohn's  disease.  She has been followed at [facility] and has come back to care  over here.  She was in her usual state of health two days ago, was actually  able to do yard work.  Was doing some lifting in the yard.  Had had one of  her students squeeze her around her chest and no real pain earlier in the  week.  Had no real traumatic episodes other than actually lifting some bags  of soil doing yard work.  She went to bed, felt fine and yesterday morning  woke up with severe left-sided pain.  This was located in the left upper  quadrant and left flank going around to the left lower abdomen.  She was  seen in the emergency room by Orlie Dakin, M.D. and evaluated yesterday.  Spent a large amount of time here.  She had a CT scan of the abdomen and  pelvis done which showed a large amount of fecal material within the colon,  some gaseous distention of the colon, no obstruction or perforation.  Gallbladder, pancreas, spleen all were normal.  The pelvis appeared normal.  She subsequently had Gastrografin enema that showed no obstruction or  perforation, just showed stool in the colon and no active Crohn's.  The  patient had a pelvic examination that was normal per discussion with Orlie Dakin, M.D. with no abnormalities.  I had previously done a small bowel  series on her about 10 days ago showing an irregular anastomosis with no  active  Crohn's, no fistula.  There had been a question of a fistula in the  past.  She has been on Remicade at regular periods.  Laboratory work was  completely normal.  Urinalysis showed a moderate amount of blood in urine  with only 2 red blood cells.  The patient went home on oral Demerol.  Has  continued to have pain.  Came back today.  Acute abdominal series was  obtained which failed to show any obstruction.   CURRENT MEDICATIONS:  1. 6NP 50 mg daily.  2. Remicade every two months.  3. Singulair.  4. Birth control pills.  5. Zyrtec.  6. BuSpar 15 mg p.o. q.h.s. for insomnia.  7. Compazine p.r.n. for nausea.   ALLERGIES:  SULFUR, BIAXIN, PHENERGAN, ANYTHING WITH CODEINE IN IT.   PAST MEDICAL HISTORY:  1. Recent treatment for meningitis with Rocephin thought to be exacerbated     by Remicade, although this was not documented by spinal tap, was a     clinical diagnosis.  She received antibiotics by the time she had been     seen by infectious disease and presumptive diagnosis of some sort of     meningitis causing jaw pain was made.  The patient received 14 days of     therapy.  Has had no recurrence.  2. Crohn's disease status post ileocolectomy back in 0165 complicated by     postoperative wound infection, possible intra-abdominal leakage.  She     underwent subsequent exploration.  Had kinking and adhesions, fistula     between the distal small bowel proximal colon that was resected and new     anastomosis was formed.  Incisional hernia was repaired.  3. She has had previous appendectomy, tonsillectomy, E. coli sepsis,     endometriosis.  4. Asthma, pulmonary allergies.   FAMILY HISTORY:  Noncontributory.  Father had diabetes.  Positive family  history of colon cancer and diabetes.  No history of inflammatory bowel  disease.   SOCIAL HISTORY:  She is married.  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  Drinks very rarely.  Does not use tobacco.   PHYSICAL EXAMINATION:   VITAL SIGNS:  The patient is afebrile.  Vital signs  are unremarkable.  GENERAL:  Alert female in no acute distress.  HEENT:  Eyes:  Sclerae non-icteric.  Extraocular movements are intact.  NECK:  Supple.  LUNGS:  Clear anteriorly and posteriorly.  No friction rub, wheeze, etc.  Examination of the left chest wall to palpation shows a tenderness over the  left rib cage radiating around to the left upper quadrant.  I do not feel a  mass there or a particular spot that is tender.  It does not seem to be  worsened by deep breath.  HEART:  Regular rhythm.  No murmurs or gallops.  ABDOMEN:  Nondistended, soft with minimal tenderness, although the patient  has received Demerol.   LABORATORIES:  Pertinent x-rays:  On examining the acute abdominal series  there is no signs of obstruction.  There is contrast from CT gastrografin  enema still in the bowel, although there was no obvious rib fracture along  the left.   ASSESSMENT:  Left upper quadrant/left chest wall pain with extensive work-up  failing to show cause.  This could be some sort of hidden kidney stones that  did not show up with a lot of blood in the urine or more likely some sort of  musculoskeletal injury of some sort.   PLAN:  Will admit for liquids for now and try to treat her with oral pain  medicines.  We will observe her and repeat her urinalysis.                                               James L. Rolla Flatten., M.D.    [name]  D:  05/25/2003  T:  05/25/2003  Job:  [id number]   cc:   Haywood Pao, M.D.  [address]  [place]  [place] [address]  Fax: [phone]

## 2011-05-11 NOTE — Op Note (Signed)
[name], [name]                 ACCOUNT NO.:  [id number]   MEDICAL RECORD NO.:  [id number]          PATIENT TYPE:  INP   LOCATION:  5731                         FACILITY:  [facility]   PHYSICIAN:  James L. Rolla Flatten., M.D.DATE OF BIRTH:  April 03, 1976   DATE OF PROCEDURE:  07/06/2005  DATE OF DISCHARGE:                                 OPERATIVE REPORT   PROCEDURE:  Colonoscopy.   ENDOSCOPIST:  Joyice Faster. Edwards, M.D.   MEDICATIONS:  Fentanyl 100 mcg, Versed 8.5 mg IV.   INDICATIONS FOR PROCEDURE:  The patient has Crohn's disease, status post  right colon resection.  She came in with nausea, vomiting and diarrhea.  This is done in the unprepped state.  She has had previous Cipro use over  the previous several weeks.  The possibility of pseudomembranous colitis has  also been raised.   DESCRIPTION OF PROCEDURE:  The procedure is explained to the patient and a  consent obtained.  This is done of the unprepped colon.  The scope was  inserted.  The prep was really quite good.  Excellent vascular pattern  throughout.  No active Crohn's.  No pseudomembranes throughout.  The  anastomosis was a bit dirty at the anastomosis, but no active Crohn's seen.  The scope was withdrawn and the written initial findings were confirmed.  The patient has what appears to be a side-to-side anastomosis at the  ascending colon.  No active pseudomembranes.  No active Crohn's in a formal  vascular pattern.   ASSESSMENT:  Normal colonoscopy.  No evidence of active Crohn's or  pseudomonas colitis.   PLAN:  Will stop Flagyl and proceed with further causes of her nausea and  vomiting.       JLE/MEDQ  D:  07/06/2005  T:  07/06/2005  Job:  [id number]   cc:   Haywood Pao, M.D.  [address]  [place]  [place] [address]  Fax: [phone]

## 2011-05-11 NOTE — Discharge Summary (Signed)
[facility]  Patient:    [name], [name]                        MRN: [id number] Adm. Date:  [id number] Disc. Date: [id number] Attending:  Gelene Mink CC:         Mayme Genta, M.D.   Discharge Summary  REASON FOR ADMISSION:  Crohns ileitis with partial small-bowel obstruction.  BRIEF HISTORY:  The patient is a 35 year old white female patient of Dr. Earlie Raveling with underlying Crohns disease.  She has noted the change in her bowel habits and blood diarrhea since July 2001.  She had undergone and extensive GI workup by Dr. Earlean Shawl including a CT scan of the abdomen which revealed an abnormal terminal ileum.  Small-bowel follow-through, repeat CT, and colonoscopy all were consistent with Crohns disease.  The patient was treated with anti-inflammatories as well as Remicade and steroids.  The patient is a at this time with persistent abdominal pain, bright red blood per rectum, change in bowel habits, and fever.  HOSPITAL COURSE:  The patient was admitted on April 15, 2001.  She was treated with intravenous hydration and pain medication.  She was seen in consultation by general surgery and prepared for the operating room.  The patient was taken to the operating room on April 17, 2001, where she underwent exploratory laparotomy and ileocecectomy.  Findings at surgery included inflammatory change involving the terminal ileum for approximately 30-40 cm with classic findings of Crohns ileitis.  The patient did well postoperatively.  She did have postoperative nausea and was treated with Zofran.  She had gradual resolution of her ileus.  Nasogastric tube was removed on the fourth postoperative day, and she was started on clear liquid diet on the fifth postoperative day.  She became ambulatory.  Postoperative course was complicated by a wound infection necessitating removal of some of the skin staples and opening of the skin and subcutaneous tissues.  She  was started on open dressing changes.  The patient did well and was prepared for discharge home on Apr 25, 2000.  DISCHARGE PLANNING:  The patient is discharged home on Apr 25, 2001, in good condition, tolerating a regular diet, and ambulating independently.  DISCHARGE MEDICATIONS: 1. Demerol tablets p.r.n. pain. 2. Keflex 500 mg t.i.d. x 5 days.  DISCHARGE FOLLOWUP:  She will be seen back at my office at [facility] in one week.  Home health nurses have been arranged from [place] to support the patient with dressing changes.  FINAL DIAGNOSIS:  Crohns ileitis with partial small-bowel obstruction.  CONDITION AT DISCHARGE:  Improved. DD:  05/16/01 TD:  05/16/01 Job: [id number]

## 2011-05-11 NOTE — H&P (Signed)
[facility]  Patient:    [name], [name] Visit Number: [id number] MRN: [id number]          Service Type: SUR Location: [address] [place] 01 Attending Physician:  Tiffany Kocher Dictated by:   Ward Givens, M.D. Admit Date:  09/23/2001                           History and Physical  CHIEF COMPLAINT:  Abdominal pain.  HISTORY OF PRESENT ILLNESS:  The patient is a 35 year old white female who suffers from Crohns disease.  She is several months status post ileocolectomy by Dr. Harlow Asa.  This was because of obstructive symptoms.  The patient has continued to have abdominal pain, vomiting, and loss of weight.  It has gotten so she can hardly eat since the operation.  The operation was complicated by wound infection.  See previous records for details.  The patient recently underwent evaluation by Dr. Earlean Shawl, and on colonoscopy was found to have evident anastomotic obstruction and an evident fistula between the small bowel and proximal remaining colon.  The patient had been shown to have an inflammatory type mass in that area on CT scan.  She is admitted to the hospital after taking bowel prep at home to have exploration and probable ileocolectomy.  The patient has also noted bulging of the anterior abdominal wall.  On the endoscopy document, I did not notice any evidence of active ileitis.  The patient has not had any bleeding.  PAST MEDICAL HISTORY:  She has a history of esophagitis.  She has Crohns as noted.  CURRENT MEDICATIONS:  Her only current medication for Crohns disease is Asacol.  Asacol, Zoloft 50 mg daily, Singulair, Zyrtec, vitamin C and E, and occasional Demerol for pain.  ALLERGIES:  VICODIN, CODEINE, and SULFA.  She is not allergic to Latex. PHENERGAN sometimes will actually make her sick.  PAST SURGICAL HISTORY:  She has had appendectomy in the past, arthroscopic knee surgery in the past, tonsillectomy, ovarian cystectomies in  2000 and 2001, and the surgery for Crohns.  SOCIAL HISTORY:  She does not smoke.  She occasionally drinks alcoholic beverages, usually beer, and without any problems there.  FAMILY HISTORY AND CHILDHOOD ILLNESSES:  Not remarkable.  REVIEW OF SYSTEMS:  Beyond the above is unremarkable.  PHYSICAL EXAMINATION:  VITAL SIGNS:  Per nursing admission report.  GENERAL:  Mental status is normal.  HEENT/NECK:  Head, neck, eyes, ears, nose, and throat are normal.  CHEST: Clear to auscultation.  BREASTS:  Normal.  ABDOMEN:  Healed midline incision with bulging with Valsalva.  Mild tenderness in the right lower quadrant.  RECTAL/PELVIC:  Exams not repeated.  EXTREMITIES:  Normal.  SKIN:  Normal.  LYMPH NODES:  Not enlarged.  NEUROLOGIC:  Normal.  IMPRESSION: 1. History of Crohns ileitis with obstruction. 2. 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 3. Incisional hernia. 4. Probable anastomotic obstruction.  PLAN:  Ileocolectomy and repair of incisional hernia. Dictated by:   Ward Givens, M.D. Attending Physician:  Tiffany Kocher DD:  09/23/01 TD:  09/23/01 Job: [id number]

## 2011-05-11 NOTE — Op Note (Signed)
[facility]  Patient:    [name], [name] Visit Number: [id number] MRN: [id number]          Service Type: SUR Location: 4[address] [place] 01 Attending Physician:  Tiffany Kocher Dictated by:   Ward Givens, M.D. Proc. Date: 09/23/01 Admit Date:  09/23/2001   CC:         Mayme Genta, M.D.   Operative Report  PREOPERATIVE DIAGNOSIS: 1. Incisional hernia. 2. Stricture of ileocolic anastomosis with fistula from distal ileum to    ascending colon. 3. History of Crohns disease.  POSTOPERATIVE DIAGNOSIS: 1. Incisional hernia. 2. Stricture of ileocolic anastomosis with fistula from distal ileum to    ascending colon. 3. History of Crohns disease.  OPERATION: 1. Repair of incisional hernia. 2. Ileocolectomy with anastomosis.  SURGEON:  Ward Givens, M.D.  ANESTHESIA:  General.  DESCRIPTION OF PROCEDURE:  After adequate anesthesia and monitoring, routine preparation and draping of the abdomen, and after a Foley catheter had been inserted, I excised the wide midline scar.  I went down to the fascia and found it attenuated and there was early incisional hernia present.  The widest gap was about 2-3 cm at several locations.  Old permanent suture was present, but not effective in holding the fascia together.  I removed the suture as I encountered it.  I then entered the peritoneal cavity and took down adhesions of the omentum and bowel to the anterior abdominal wall.  There were not too many adhesions.  There was no distention of the colon or small-bowel.  The liver, gallbladder, stomach, and duodenum looked normal.  The distal colon looked normal.  There was a small inflammatory mass in the right side of the abdomen.  There was no peritonitis.  I ran the small-bowel going from the ligament of Treitz distally and saw no evidence of active Crohns disease in any segment.  I then freed up the right colon and dissected up an area of  evident inflammation around the ileocolic anastomosis.  There was not much of a mass, but there was definitely some induration.  It was stuck a little bit to the retroperitoneum, and I very carefully dissected it out, taking care to avoid injury to the ovarian vein and ureter.  I got good mobility of the right colon all the way up to the proximal transverse colon and then dissected in the area of the inflammatory mass.  There I came into a chronically infected area which was obviously fistula, and I could easily put the clamp from the chronically infected area to the inside of the bowel.  I made the decision that it was best to resect that entire segment, which would involve removal of just perhaps 8-10 cm of colon and a bit more small intestine than that.  I divided the bowel proximally and distally with the cutting stapler and then segmentally divided the mesentery between clamps, ligating the vessels with 2-0 silks.  I approximated the bowel side-to-side and performed side-to-side anastomosis with cutting stapler at the distal end after cutting out small segments of the antimesenteric part of the staple lines.  The anastomosis was nicely hemostatic.  I closed the remaining defect with a single firing of linear stapler.  After getting good hemostasis, I closed the mesenteric defect with interrupted 3-0 silk stitches, then copiously irrigated the abdominal cavity with saline solution, and removed the irrigant.  I then began the abdominal closure.  The hernia was obviously present, so I debrided  the attenuated fascia, then freed up the rectus muscle laterally for several centimeters on each side, and cut the laxing incision in the anterior rectus sheath to allow easier approximately of healthy fascia.  I then closed the fascia with running #1 PDS and there was no tension at all present.  I placed a flat 10-mm Jackson-Pratt drain in the subcutaneous tissues brought out through a separate  small stab incision and then closed the skin with staples.  Sponge, needle, and instrument counts were correct.  The patient was stable throughout.  She remained in the operating room for Dr. Concepcion Living, M.D. to place a central venous catheter following completion of the procedure. Dictated by:   Ward Givens, M.D. Attending Physician:  Tiffany Kocher DD:  09/23/01 TD:  09/24/01 Job: [id number]

## 2011-05-11 NOTE — H&P (Signed)
[facility]  Patient:    [name], [name]                        MRN: [id number] Adm. Date:  [id number] Attending:  Harriette Bouillon CC:         Earnstine Regal, M.D.  Gus Height, M.D.   History and Physical  ADMITTING DIAGNOSIS:  Crohns ileitis with partial small-bowel obstruction.  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  She has a history of antecedent abdominal pain which has been intermittent for the past 10 years.  She was evaluated for this by her gynecologist, Dr. Gus Height, this past winter with abdominal CT which revealed a thickened ileum in the region of the ileocecal valve.  A small bower series was subsequently performed revealing a 30 cm segment in the distal ileum with scarring, thickening, and prestricture dilation.  There was no evidence of a fistula formation.  GI consultation was initially performed February 21, 2001.  The diagnosis of Crohns ileitis was made.  Colonoscopy with ileoscopy was performed, revealing a normal colon.  There was acute ulcerative inflammation in the terminal ileum, biopsies of which were consistent with Crohns disease.  Initial therapy with Entocort 9 mg daily was initiated.  The patient obtained no improvement in her symptoms after one month of therapy.  Remicade infusion March 24, 2001, was next tried.  Similarly, the patient received no sustained benefit.  She was hesitant to initiate immunosuppressive therapy with 6-MP and was also hesitant to initiate steroid therapy.  Pentasa 250 mg 4 tablets q.i.d. was initiated three weeks ago.  The plans were for a second Remicade infusion at one month interval.  Because of worsening pain associated with bloody diarrhea approximately three weeks ago, the patient was started on prednisone 40 mg daily.  At the time of admission, she has been on this dose for the past  12 days.  She is admitted to the hospital today because of progressive worsening of her pain, associated fever, and a desire for more definitive therapy.  She is unable to sleep at night.  She notes discomfort during motions, and symptoms are exacerbated with walking.  She is unable to teach at school.  PAST MEDICAL HISTORY: 1. Endometriosis. 2. Lifelong constipation.  CURRENT MEDICAL REGIMEN: 1. Prednisone 40 mg daily. 2. Pentasa 250 mg 4 tablets q.i.d. 3. Zyrtec 1 daily. 4. Singulair daily. 5. minocycline daily - acne. 6. Zoloft 50 mg daily. 7. Depo-Provera q 3 months. 8. Levbid q.h.s. 9. Supplements - vitamin C, vitamin E, Tylenol p.r.n.  SOCIAL HISTORY:  The patient was born in [place], raised in [place], living in [place] since 1979.  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  Anticipating marriage in July 2002.  Substance - one beer per week.  No tobacco, no drug use. Education - Forensic psychologist.  Diet -  avoids milk due to symptoms of intolerance.  Exercise - walking, clogging.  Religion - [employer].  FAMILY HISTORY:  Mother living age 18 with flank pain.  Father deceased from diabetes.  Brother 75 and healthy.  No children.  Additional family history - colon cancer, liver cancer, diabetes.  REVIEW OF SYSTEMS:  Overall health presently poor.  Poor energy level, poor appetite.  Febrile, occasional diaphoresis.  No rigor or chills.  GI:  No nausea or vomiting.  Abdominal distension associated with pain.  Last bowel movement was eight days ago.  At that time with bright red blood per rectum, none since.  GU:  History of recent UTI, last treated two months ago.  Denies dysuria, polyuria, nocturia.  No flank pain.  GYNECOLOGIC:  Gravida 0, para 0. Suspected endometriosis treated with Depo-Provera, followed by Dr. Gus Height. MUSCULOSKELETAL:  Diffuse arthralgias involving shoulders, neck, and low back. SKIN:   Tattoo.  No additional lesion.  Mild sunburn from recent trip to the beach.  PSYCHIATRIC:  Depression accompanying present illness. ENDOCRINE:  Easy bruising.  The remainder of system review is negative.  PHYSICAL EXAMINATION:  GENERAL:  Acutely ill-appearing young white female, moderate distress due to pain.  VITAL SIGNS:  Stable except for low-grade fever of 100 degrees.  HEENT  Anicteric sclerae.  Pink conjunctivae.  EOMI.  PERRL.  No oropharyngeal lesion.  No lesion of the lips, teeth, or gums.  NECK:  Supple.  No adenopathy or thyromegaly.  CHEST:  Clear to auscultation.  CARDIAC:  Regular rhythm.  No gallop, no murmur.  Pulses are full, bilaterally symmetric with normal contour throughout.  ABDOMEN:  Soft, mildly distended, tender right lower quadrant - moderate.  No rebound or guarding.  Fullness palpable without discrete mass.  Bowel sounds active.  No borborygmi, bruit, or succussion splash.  No organomegaly.  RECTAL:  Not performed.  EXTREMITIES:  Without clubbing, cyanosis, or edema.  Skin - without lesion.  NEUROLOGIC:  Nonfocal.  ASSESSMENT: 1. Crohns ileitis - Known area of narrowing approximately 30 cm length, established on CT and small bowel series two months ago.  Initially the hope was that this represented acute inflammation rather than cicatrization. Unfortunately, the patient has been refractory to treatment with anti-inflammatory, Remicade, topical and oral steroids.  Her pain continues to worsen.  She also had a recent UTI and has ongoing low-grade fever raising the question of possible fistula into the urinary tract, possibly exacerbating symptoms.  At this point, I think we are left with little choice other than to surgically excise the involved segment of small bowel.  She has shown no response to the initial 12 days of steroid therapy.  An alternative discussed  with the patient would be hospitalization for bowel and bed rest with  IV steroids.  She is refusing this because of her concerns regarding side effects of steroids, their lack of benefit thus far, and early signs of complication, including fluid retention and early cushingoid facies.  She is agreeable to considering surgical intervention.  Her main concern right now is obtaining relief from her severe abdominal pain. 2. History of urinary tract infections - Possible fistula from Crohns disease, though not demonstrated on prior x-ray studies. 3. Endometriosis. 4. History of constipation.  RECOMMENDATIONS: 1. Admission for bowel and bed rest. 2. IV hydration. 3. Analgesics. 4. Surgical consultation - evaluate for possible small bowel resection.  Discussed case with Dr. Armandina Gemma, who will see the patient in consultation tomorrow, April 16, 2001. DD:  04/16/01 TD:  04/14/01 Job: [id number]

## 2011-05-11 NOTE — H&P (Signed)
[facility]  Patient:    [name], [name]                        MRN: [id number] Adm. Date:  [id number] Attending:  Harriette Bouillon CC:         Earnstine Regal, M.D.  Gus Height, M.D.   History and Physical  ADMITTING DIAGNOSES: 1. Crohns ileitis, status post ileocecal resection on April 17, 2001. 2. Nausea, vomiting and diarrhea. 3. Right lower quadrant tender mass.  HISTORY OF PRESENT ILLNESS:  The patient has a documented history of chronic Crohns ileitis, for which she underwent ileocecal resection performed by Dr. Armandina Gemma on April 17, 2001 due to chronic obstructive symptoms.  Her postoperative course was complicated by mild wound infection, which subsequently healed.  The patient was feeling extremely well until approximately two weeks ago.  She was seen by me in the office earlier this week with mild progressive symptoms of nausea with sporadic vomiting, diarrhea with 3-5 watery stools per day, low grade fever, and right lower quadrant firmness.  Examination confirmed a right lower quadrant mass effect and a low grade fever.  The patient was submitted to abdominal/pelvic CT.  This revealed thickening in the region of the anastomosis with mesenteric induration.  I discussed the case with Dr. Armandina Gemma.  The differential appeared to be that of recurrent Crohns disease, rather early postoperatively versus a postoperative complication, with possible anastomotic leak.  The patient was started on metronidazole 500 mg t.i.d., Asacol 400 mg, two tablet t.i.d. and given Phenergan 25 mg p.o. or p.r. q.4h. p.r.n. nausea.  Over the past several days, symptoms have continued to gradually progress. Phenergan has had no benefit.  Nausea is her main complaint.  Vomiting a bit more frequent.  Her right lower quadrant has become increasingly tender, especially with jarring movements.  Bowel habits have become more profuse, with diarrheal stools now 10-12  times per day with urgency, at times awakening her from sleep.  No recognizable blood.  The patient denies rigor or diaphoresis, but remains chilled.  Decreased energy, decreased appetite.  No adverse sequelae from the current medical regimen is noted above.  PRIOR SURGERY/HOSPITALIZATIONS:  Knee surgery in 1990 and 1992.  Appendectomy in 1992.  Tonsillectomy in 1996.  E. coli sepsis in 1999.  Endometriosis in 1998 and 2001.  Ileocecal resection on April 17, 2001.  MEDICAL DIAGNOSES: 1. Endometriosis. 2. Asthma. 3. Allergies.  ALLERGIES:  Sulfa, codeine derivatives, Vicodin.  CURRENT MEDICAL REGIMEN:  Depo-Provera, Singulair, Advair, Zyrtek, desensitization injections, minocycline 100 mg q.d., Zoloft 50 mg q.d.  Most recently on Metronidazole 500 mg t.i.d., Asacol 400 mg two tablets t.i.d. Phenergan 25 mg q.6h.  REVIEW OF SYSTEMS:  Overall health deteriorating over the past 1-2 weeks. Poor energy.  No appetite.  Nausea and vomiting.  Diarrhea.  RESPIRATORY: No recent flare of asthma.  GI: Abdominal distention.  GU: No symptoms of dysuria, polyuria, nocturia.  Denies clouding of the urine.  No hesitancy or change in stream.  No CVA tenderness.  GYNECOLOGIC: Gravida 0, para 0. MUSCULOSKELETAL: No complaints of arthralgias.  SKIN: Tattoo.  Well-healed midline abdominal incision.  NEUROPSYCHIATRIC: Anxiety, depressed regarding recurrent symptoms.  ENDOCRINE: Easy bruising persists.  SOCIAL/PERSONAL HISTORY:  The patient was born and raised in [place] and in [place].  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 Wedding anticipated for June has been postponed until November.  SUBSTANCE:  Rare beer.  No tobacco.  No drug use.  EDUCATION:  Forensic psychologist.  DIET:  Avoids milk.  EXERCISE:  Has yet to return to regular exercise regimen, given illness this past spring.  RELIGION:  [employer].  FAMILY HISTORY:  Mother alive at 30 with flank pain.  Father  deceased from diabetes.  Brother 29 and healthy.  No children.  ADDITIONAL FAMILY HISTORY:  Colon cancer, liver cancer, diabetes.  PHYSICAL EXAMINATION:  GENERAL:  Ill-appearing, young white female appearing younger than her stated age.  Alert and oriented and anxious.  VITAL SIGNS:  Stable.  HEENT:  Anicteric sclerae.  No pallor.  EOMI.  PERRL.  Mouth shows no oropharyngeal lesions, without abnormality of the lips, teeth or gums.  No pharyngeal injection.  NECK:  Supple.  No adenopathy, thyromegaly or bruits.  CHEST:  Clear to auscultation.  No adventitious sounds.  No CVA tenderness.  CARDIOVASCULAR:  Regular rhythm.  No gallop.  No murmur.  ABDOMEN:  Soft.  Tender right lower quadrant with mass effect.  No rebound or guarding.  Absent bowel sounds.  Faint right lower quadrant succussion splash.  No organomegaly.  RECTAL:  Not performed.  MUSCULOSKELETAL:  No lesions of the extremities.  Normal muscle tone.  No clubbing, cyanosis, or edema.  SKIN:  No abnormality identified.  LABORATORY DATA:  Pending.  ASSESSMENT: 1. Crohns ileitis status post ileocecal resection on April 17, 2001. 2. Nausea, vomiting and diarrhea. 3. Right lower quadrant tender mass.  DIFFERENTIAL DIAGNOSES:  The differential diagnosis appears to be that of either recurrent Crohns disease, which would suggest a particularly aggressive from of this disease, or post surgical complication.  These would include anastomotic leak, perianastomotic abscess formation, or possible small bowel obstruction.  Given the two-month interval since surgery, I am inclined to suspect recurrent inflammatory bowel disease as the underlying culprit. disappointing response to initiation of metronidazole and Asacol with progressive symptoms.  RECOMMENDATIONS: 1. IV fluids. 2. Clear liquids. 3. Antiemetics. 4. Analgesics.  5. Stool workup including C. difficile toxin, C&S, fecal leukocytes,    hemoccults. 6. Hold  Metronidazole, Asacol and all other medications. 7. Blood C&S. 8. Further study of ileocolonic anastomosis.  I will review the CT scan and    findings with radiology tomorrow.  Whether a Gastrografin small bowel    series or repeat CT is indicated will be decided after review of prior CT. 52. Reconsult surgery - Dr. Armandina Gemma. DD:  06/22/01 TD:  06/22/01 Job: [id number]

## 2011-05-11 NOTE — Op Note (Signed)
NAME:  [name], [name]                           ACCOUNT NO.:  [id number]   MEDICAL RECORD NO.:  [id number]                   PATIENT TYPE:  AMB   LOCATION:  [facility]                                 FACILITY:  [facility]   PHYSICIAN:  Shanda Bumps., M.D.      DATE OF BIRTH:  1976-06-28   DATE OF PROCEDURE:  06/02/2003  DATE OF DISCHARGE:                                 OPERATIVE REPORT   PREOPERATIVE DIAGNOSIS:  Proximal left ureteral stone.   POSTOPERATIVE DIAGNOSIS:  Proximal left ureteral stone.   OPERATION:  Cysto, retrograde pyelogram, left ureteroscopy with removal of  stone and insertion of left ureteral stent.   ANESTHESIA:  General.   SURGEON:  Corky Downs, M.D.   BRIEF HISTORY:  This 35 year old white female is admitted with a persistent  3 mm left upper ureteral stone x8 days, presented May 25, 2003 with left  flank pain with nausea and vomiting. IVP showed L3-4 stone with  hydronephrosis, no previous stone. She did have previous Crohns disease with  removal of her cecum and terminal ileum in February 2002 followed up by an  abscess. She has allergies to SULFA, CODEINE and PHENERGAN. Because of  persistent pain and discomfort, she enters now for ureteroscopy for this  even though this is a fairly small stone and just has not progressed in over  a week.   DESCRIPTION OF PROCEDURE:  The patient was placed on the operating table in  the dorsal lithotomy position. After satisfactory induction of general  anesthesia, she was prepped and draped in the usual sterile fashion. KUB was  difficult to see the stone due to overlying gas. So a retrograde was done.  This demonstrated the upper ureter, the junction of the upper and middle  third a small filling defect which looked similar to the KUB and CT scan she  had previously showing the stone at this point. There seemed to be a little  waist or stricture just below the stone. After the retrograde, I passed a  0.38  floppy tip guidewire up beyond the stone into the renal pelvis. Over  the guidewire, I tried to pass the ureteral access sheath but could not get  it very far into the ureter so I took just the inner dilator of the sheath  and was able to dilate up to this point. I then ran the 6 short ureteroscope  along the side of the guidewire up to the proximal ureter where I could see  the stone. When I grabbed it with the nitinol basket, it seemed to break  into three small pieces. I removed the largest piece and the others just  would not stay in the basket. Removed the scope and then over the guidewire  passed a 6 French x 24 cm ureteral stent with a string attached at the  distal end and removed the guidewire. The stent was in good position. I  think the  little small pieces should piece and will remove the stent in 48  hours. She was taken to the recovery room after 30 mg of Toradol and a B&O  suppository was inserted and will be sent home with detailed written  instructions.                                               Shanda Bumps., M.D.    HMK/MEDQ  D:  06/02/2003  T:  06/02/2003  Job:  [id number]

## 2011-05-11 NOTE — H&P (Signed)
NAME:  [name], [name]                     ACCOUNT NO.:  [id number]   MEDICAL RECORD NO.:  [id number]                   PATIENT TYPE:  INP   LOCATION:  5737                                 FACILITY:  [facility]   PHYSICIAN:  Berneta Sages, M.D.                     DATE OF BIRTH:  October 05, 1976   DATE OF ADMISSION:  03/03/2003  DATE OF DISCHARGE:                                HISTORY & PHYSICAL   CHIEF COMPLAINT:  Fever, cannot open her mouth.   HISTORY OF PRESENT ILLNESS:  This is a 35 year old Caucasian female who is  under the care of gastroenterology at [facility], as well as Dr.  Oletta Lamas locally, and under Dr. Osborne Casco for primary care.  She has a past  medical history significant for Crohn's disease, status post partial  resection of her bowels in 03/2001, along with asthma, seasonal allergic  rhinitis, and insomnia.  She presents with a two month history of recurrent  Crohn's with blockage and a 10 pound weight loss.  Remicade infusions have  been started last week.  She has had recurrent problems with nausea, using  Compazine on an as-needed basis, but last used on 02/26/03.  Remicade  infusions have been followed by diffuse myalgias.  Today, the patient  developed a fever of 100 degrees Fahrenheit, followed by spasm and tightness  of her jaw with an inability to open this for even minor conversation.  This  occurred at approximately 3 p.m.  She presented to the emergency room for  further evaluation.  In the emergency room, she was given intravenous  fluids, intravenous Valium and Benadryl, with partial but not complete  improvement of her symptoms.  The fever continued.  The patient was admitted  for further evaluation.  Of note, the ER physician did call  gastroenterology, as well as ears, nose, and throat, who stated that the  myalgias were not unusual status post Remicade infusion; however, the muscle  and jaw spasms and/or tightness were not consistent with typical side  effects from Remicade treatment.  Ear, nose, and throat consultation had no  new recommendations, but did request a consultation if desired after the  admitting physician saw the patient.   REVIEW OF SYSTEMS:  Positive for fevers, chills, but negative for worsening  allergies, breathing problems, sore throat, dysphagia, worsening nausea or  vomiting.  Negative for shortness of breath, chest pain, worsening abdominal  discomfort.  Negative for new neurological deficits.  Negative for dysuria.   PROBLEM LIST:  Problem 1.  Crohn's disease status post resection in 03/2001.  Problem 2.  Asthma.  Problem 3.  Seasonal allergic rhinitis.  Problem 4.  Insomnia.   SOCIAL HISTORY:  The patient is single.  Has no history of tobacco abuse.  Has rare use of alcohol.  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   FAMILY HISTORY:  Negative for Crohn's but positive for diabetes,  diverticulitis, and colon cancer.   CURRENT MEDICATIONS:  1. __________  50 mg p.o. q.h.s.  2. Singulair 10 mg p.o. q.p.m.  3. Zyrtec 10 mg p.o. q.h.s.  4. BuSpar 15 mg p.o. q.h.s. for insomnia.  5. Compazine as needed, but last used on 02/26/2003.   ALLERGIES:  1. SULFA.  2. BIAXIN.  3. PHENERGAN.  4. Contraindications to NONSTEROIDAL ANTI-INFLAMMATORY AGENTS, given her     Crohn's disease.   LABORATORY EVALUATION:  Urinalysis unremarkable.  White blood cell count  12.0, with 82% neutrophils, hemoglobin 13.3, hematocrit 38.6%, platelet  count 281.  Sodium 137, potassium 3.9, BUN 10, creatinine 0.7, glucose 105,  calcium 9.0.  Strep A screen is negative.  Pregnancy test negative.  Throat  culture is pending.  Blood cultures x2 pending.   PHYSICAL EXAMINATION:  GENERAL:  A Caucasian female in mild malaise, but  verbal via partially clenched mouth, who is appropriate in her responses.  VITAL SIGNS:  Temperature 100.6 degrees Fahrenheit, blood pressure 98/58,  pulse 126, respirations 22,  oxygen saturation 99% on room air.  HEENT:  Normocephalic and atraumatic.  Sclerae are anicteric.  Extraocular  movements are intact.  There is no sinus tenderness.  Tympanic membranes are  clear bilaterally.  Through a partially open mouth there are no  oropharyngeal lesions or abscesses visualized.  NECK:  Supple.  There is no cervical lymphadenopathy.  There is no JVD.  LUNGS:  Clear to auscultation bilaterally.  CARDIOVASCULAR:  Regular rate and rhythm.  ABDOMEN:  Soft, nondistended abdomen with bowel sounds present.  The patient  is mildly tender over previous anastomosis site, which is not new, per the  patient's report.  EXTREMITIES:  No edema.  Pulses are intact in all four extremities.  NEUROLOGIC:  Nonfocal.   ASSESSMENT AND PLAN:  1. [place] jaw muscle spasm and/or tightness, which is isolated, with fever     of unclear etiology at this time.  Compazine was discontinued five days     ago, so doubt neuroleptic  malignant syndrome or other syndrome affecting     muscle tone.  There is no cervical lymphadenopathy.  Our plan will be to     follow up on blood and throat cultures, treat with empiric Unasyn for     both AeroBid and anaerobic coverage, provide IV fluids, obtain head and     neck CT to rule out abscess, and obtain ENT consultation if indicated.     Will use valium as needed for muscle relaxation.  2. Crohn's disease.  Will continue __________  at bedtime.  Will give IV     fluids and defer additional workup to gastroenterology, which is ongoing.  3. Asthma, stable on Singulair.  Will add albuterol nebulizer treatments as     needed.                                               Berneta Sages, M.D.    RA/MEDQ  D:  03/03/2003  T:  03/04/2003  Job:  [id number]   cc:   Haywood Pao, M.D.  [address]  [place]  [place] [address]  Fax: [phone]

## 2011-05-11 NOTE — H&P (Signed)
NAME:  [name], [name]                           ACCOUNT NO.:  [id number]   MEDICAL RECORD NO.:  [id number]                   PATIENT TYPE:  INP   LOCATION:  5737                                 FACILITY:  [facility]   PHYSICIAN:  Raelyn Ensign. Vladimir Faster, M.D.            DATE OF BIRTH:  27-Jul-1976   DATE OF ADMISSION:  06/17/2003  DATE OF DISCHARGE:                                HISTORY & PHYSICAL   HISTORY OF PRESENT ILLNESS:  [name] is a 35 year old female who presents  to the emergency room with intractable nausea and vomiting status post her  fourth Remicade treatment for Crohn's disease.  In 2002 she underwent  terminal ileal and cecal resection with anastomosis, this was revised later  the same year because of apparent fistula formation.  She has been cared for  at [facility] until recently by Dr. Oletta Lamas.  In January 2004 an upper GI  small bowel series again suggested a jejunal transverse colon fistula and  she was begun on Remicade treatments.  She reports that following each  Remicade treatment she has nausea which usually only lasts until the  following morning.  However this has gone on for 35 hours and is  unresponsive to Zofran and Compazine.  She has had prior unusual reactions  to Remac aide as well with inability to open her jaw.  It was felt she may  have developed a meningitis picture after a prior Remicade treatment.  Currently she denies any hematemesis or abdominal pain.  She has been having  approximately five loose stools per day and this has not changed.  She sees  no blood in the stool.  She is not having headaches or muscle problems at  present.   PAST MEDICAL HISTORY:  1. Crohn's disease.  2. Endometriosis.  3. Kidney stones.  She recently underwent cystoscopy and stent placement for     a left renal stone on 06/02/03.   CURRENT MEDICATIONS PRIOR TO ADMISSION:  1. Zyrtec 10 mg daily.  2. Singulair 10 mg daily.  3. Minocycline 100 mg daily.  4. 6-MP 50 mg  daily.  5. Imitrex p.r.n.  6. Buspirone 7.5 mg q.a.m.  and 15 mg q.h.s.  7. Compazine p.r.n.   ALLERGIES:  SULFA, CODEINE, PHENERGAN.   FAMILY HISTORY:  Noncontributory.   SOCIAL HISTORY:  She is a single third grade teacher, a nonsmoker,  nondrinker.   REVIEW OF SYSTEMS:  GENERAL: No weight loss or night sweats.  ENDOCRINE: No  history of diabetes or thyroid problems.  SKIN: No rash or pruritus.  EYES:  No icterus or change in vision.  ENT: No aphthous ulcers or chronic sore  throat.  RESPIRATORY: No shortness of breath, cough or wheezing.  CARDIAC:  No chest pain, palpitations or history of valvular heart disease.  GI: As  above.  GU: As above, no current dysuria.  Remainder of review of systems  is  negative.   PHYSICAL EXAMINATION:  GENERAL: She is a depressed appearing adult female in  no acute distress.  VITAL SIGNS: Afebrile, blood pressure 128/67, pulse 119.  SKIN: Normal.  HEENT: Eyes are anicteric.  Oropharynx unremarkable.  NECK: Supple without thyromegaly, there is no cervical or inguinal  adenopathy.  CHEST: Clear.  CARDIAC: Heart sounds regular rate and rhythm, mildly tachycardic.  ABDOMEN: Soft and nontender.  Bowel sounds are present, there is no mass.  There is a healed midline incision with a questionable hernia at the site.  RECTAL EXAM: Not performed.  EXTREMITIES: Without cyanosis, clubbing, edema or rash.   LABORATORY DATA:  Hemoglobin 13.8, WBC 7.2 thousand, platelet count 333,000.  Electrolytes are normal.  Urinalysis is positive for ketones, negative for  nitrite, there are however 7-10 white blood cells present.   IMPRESSION:  35 year old female with intractable nausea and vomiting status  post Remicade treatment a day and one-half ago.  She is admitted for  hydration, treatment of nausea with Compazine and Reglan, and observation.  Abdominal x-ray will be performed to rule out obstruction.   RECOMMENDATIONS:  Reconsideration of Remicade utility  should take place  since the patient has been having so much problem with these confusions and  the question of the jejunal transverse colon fistula remains.  It was only  suggested on the film and not definite.  In light of her lack of anemia and  normal albumin it would seem that the Crohn's is not very serious at  present.  These decisions regarding Remicade will be left for Dr. Oletta Lamas on  his return.                                               Raelyn Ensign. Vladimir Faster, M.D.    PJS/MEDQ  D:  06/18/2003  T:  06/19/2003  Job:  [id number]   cc:   Jeneen Rinks L. Rolla Flatten., M.D.  [address] [place]  [place] [address]  Fax: [phone]   Haywood Pao, M.D.  [address]  [place]  [place] [address]  Fax: [phone]    cc:   Joyice Faster. Rolla Flatten., M.D.  [address] [place]  [place] [address]  Fax: [phone]   Haywood Pao, M.D.  [address]  [place]  [place] [address]  Fax: [phone]

## 2011-05-11 NOTE — Op Note (Signed)
[facility]  Patient:    [name], [name]                        MRN: [id number] Proc. Date: 04/17/01 Adm. Date:  [id number] Attending:  Harriette Bouillon CC:         Mayme Genta, M.D.   Operative Report  PREOPERATIVE DIAGNOSIS:  Crohns ileitis with partial small bowel obstruction.  POSTOPERATIVE DIAGNOSIS:  Crohns ileitis with partial small bowel obstruction.  PROCEDURE:  Exploratory laparotomy with ileocecal resection and primary anastomosis.  SURGEON:  Earnstine Regal, M.D.  ASSISTANT:  Sammuel Hines. Shiela Mayer., M.D.  ANESTHESIA:  General per Dr. Cheri Rous.  ESTIMATED BLOOD LOSS:  Less than 100 cc.  PREPARATION:  Betadine.  COMPLICATIONS:  None.  INDICATIONS FOR PROCEDURE:  The patient is a 35 year old white female admitted by Dr. Earlie Raveling with Crohns ileitis and partial small bowel obstruction refractory to medical management. The patient now comes to surgery for resection.  DESCRIPTION OF PROCEDURE:  The procedure was done in OR #11 at the [facility]. The patients brought to the operating room and placed in a supine position on the operating room table. Following the administration of general anesthesia, the patient is prepped and draped in the usual strict aseptic fashion. After ascertaining that an adequate level of anesthesia had been obtained, a midline abdominal incision was made with a #10 blade. Dissection was carried through the subcutaneous tissues.  The fascia was incised in the midline and the peritoneal cavity is entered cautiously. The abdomen is explored. The liver is normal. The gallbladder is normal. The stomach is normal. The duodenum is normal. The spleen is normal. The colon is normal with the exception of a redundant sigmoid loop. There are also surgical clips present at the cecum consistent with previous history of appendectomy. The cecum is mobilized from its peritoneal attachment using the  electrocautery for hemostasis. The small bowel shows creeping fat over the entire terminal ileum. There is diffuse thickening. There is on sign of abscess or fistula formation. The involved segment of terminal ileum measures approximately 30-40 cm in length. A point in the mid ileum proximal to the abnormal bowel is selected. The mesentery is opened with the electrocautery. The bowel is transected using the GIA stapler. The mesentery is divided between [place] clamps and ligated with 2-0 silk ties. Dissection is carried over the cecum and up the ascending colon to the mid ascending colon. At this point, the bowel is again transected with a GIA type stapler. The remaining mesentery is divided between [place] clamps and ligated with 2-0 silk ties. The staple line on the colon is inspected for hemostasis and hemostasis obtained with interrupted 3-0 silk sutures. The specimen is passed off the field. Next the ileum is reapproximated to the ascending colon with interrupted 3-0 silk sutures. Enterotomies are made and a GIA type stapler is inserted and fired creating a side to side anastomosis. The staple line is inspected for hemostasis. Enterotomy is closed with a TA-60 stapler. All staple lines are inspected for hemostasis and hemostasis obtained with interrupted 3-0 silk sutures. The mesenteric defect is closed with interrupted 3-0 silk sutures. The anastomosis is widely patent. The abdomen is then copiously irrigated with warm saline. This was evacuated. The ascending colon is returned to the right side of the abdomen. The small bowel is run from the ligament of Treitz to the anastomosis and there is no other abnormality  of the small bowel identifiable. In the pelvis, the uterus appears normal. The ovaries are normal without masses. The abdomen is copiously irrigated with warm saline which is evacuated. Omentum is used to cover the small bowel. The nasogastric tube is properly positioned within  the stomach. The midline wound is closed with a running #1 Novofil suture. The subcutaneous tissue is irrigated and hemostasis obtained with the electrocautery. The skin is closed with stainless steel staples. Sterile dressings are applied. The patient is transferred to the recovery room in stable condition. The patient tolerated the procedure well. DD:  04/17/01 TD:  04/18/01 Job: [id number]

## 2011-05-11 NOTE — Discharge Summary (Signed)
[facility]  Patient:    [name], [name] Visit Number: [id number] MRN: [id number]          Service Type: SUR Location: 4W [place] 01 Attending Physician:  Tiffany Kocher Dictated by:   Ward Givens, M.D. Admit Date:  09/23/2001 Discharge Date: 10/01/2001   CC:         Mayme Genta, M.D.   Discharge Summary  HISTORY OF PRESENT ILLNESS:  The patient is a 35 year old white female with Crohns disease who underwent ileocolectomy by Dr. Harlow Asa several months ago. This was complicated by postoperative infection of the wound and probable intra-abdominal infection, as well.  Patient has developed obstructive symptoms.  She had lost weight.  Dr. Earlean Shawl found that there was no evidence of active Crohns disease particularly and the patient was maintained on Asacol.  However, an inflammatory-type mass was shown in the area of the surgical anastomosis and, on colonoscopy, the anastomosis appeared narrowed or stenotic and was felt to be evidence of a fistula between the small bowel and the proximal remaining colon.  Because of symptoms, she is admitted after taking a bowel prep at home to undergo exploration and probable re-resection. Patient also noticed some bulging of the abdominal incision which was slight. She is additionally on Zoloft, Singulair, Zyrtec, vitamin C and vitamin E, and occasional Demerol for pain.  See my history and physical for details.  PHYSICAL EXAMINATION:  She is a thin woman.  There was slight abdominal tenderness and slight bulging of the abdominal incision  There were no open areas of the wound.  There was slight bulging of the cheeks and roundness of the face.  The skin was generally normal.  HOSPITAL COURSE:  On the day of admission, the patient underwent abdominal exploration.  She was found to have not a definite anastomotic stricture but a good deal of kinking of the bowel in that region.  She was found to have  a small fistula between the distal small bowel and proximal remaining colon.  I resected the distal ileum and proximal colon with a primary new stapled anastomosis.  I also repaired a small incisional hernia which was present. Postoperatively, the patient had moderate pain.  She developed no wound or other infectious problems.  Return of GI function was a bit slow but not out of the ordinary.  By October 01, 2001, the patient was comfortable, eating well, bowels were moving quite well and incision was doing fine.  Staples were out.  She was discharged and arrangements made for follow-up in the office. Arrangements were also to be made for follow-up by a gastroenterologist for ongoing care of her Crohns disease.  The final pathology report showed no active Crohns disease.  A fistula was not mentioned but clinically was present.  IMPRESSION: 1. Crohns disease. 2. Anastomotic stricture with ileocolic fistula. 3. Incisional hernia.  OPERATION:  Ileocolectomy with anastomosis and repair of incisional hernia.  DISCHARGE CONDITION:  Improved. Dictated by:   Ward Givens, M.D. Attending Physician:  Tiffany Kocher DD:  10/15/01 TD:  10/17/01 Job: [id number]

## 2011-05-11 NOTE — Discharge Summary (Signed)
NAME:  [name], [name]                           ACCOUNT NO.:  [id number]   MEDICAL RECORD NO.:  [id number]                   PATIENT TYPE:  INP   LOCATION:  5737                                 FACILITY:  [facility]   PHYSICIAN:  Ronald Lobo, M.D.                DATE OF BIRTH:  07/05/1976   DATE OF ADMISSION:  06/17/2003  DATE OF DISCHARGE:  06/20/2003                                 DISCHARGE SUMMARY   FINAL DIAGNOSES:  1. Nausea and vomiting post-Remicade.  2. Crohn's ileitis, status post surgery; questionable fistulous disease     involving jejunum and colon.  3. History of kidney stone status post recent cystoscopic extraction     followed by stent placement and removal.  4. Possible depression.  5. Endometriosis.   CONSULTATIONS:  None.   COMPLICATIONS:  None.   PROCEDURE:  None.   LABORATORY DATA:  White count 7200 with 78 polys and 15 lymphs, hemoglobin  13.8 with MCV 87, RDW 13, platelets 333,000.  CMET entirely within normal  limits including liver chemistries.  Urinalysis on admission showed moderate  heme, positive ketones, negative nitrite, moderate leukocyte esterase, few  epithelial cells, 7-10 white cells, 3-6 red cells and a few bacteria.  Urine  culture is pending at this time, having been re incubated for better growth.   HOSPITAL COURSE:  [name] underwent another Remicade infusion (her fourth)  for Crohn's disease.  This was done the day prior to admission and, as is  typical for the patient, she developed nausea and vomiting following the  infusion.  This persisted despite outpatient use of Compazine so she was  admitted for symptom management and hydration.  The patient received IV  Compazine and IV fluids and gradually her symptoms tapered off and her diet  was able to be advanced.   By the day of discharge, the patient was tolerating a solid diet and had  some residual nausea which was fairly well controlled by the use of oral  Compazine.   Other  issues include the possibility of a urinary tract infection; she has  had the recent urologic intervention by Dr. Nani Skillern (cystoscopic  removal of a kidney stone) approximately three weeks prior to admission.  The patient notes no dysuria but is having urinary frequency.   Concerning possible depression, the patient's mother notes that the patient  has had a flatter affect than is normal for her and a couple of years ago  the patient had a good therapeutic response to Zoloft so it was recommended  that the patient discuss with her primary physician the possibility of  re  instituting that medication.   By the day of discharge, it was felt clinically appropriate for the patient  to go home.   DISPOSITION:  The patient is discharged home with recommendations to move up  her appointment with Dr. Oletta Lamas, currently scheduled for several weeks  from  now, to discuss the advisability of further Remicade treatments.  She has  also been advised to contract Dr. Jules Schick regarding her urinary symptoms  and to get his advice about whether to treat a possible bladder infection  depending on the outcome of the urine culture.  Finally, as noted, it was  recommended that she contact her primary physician regarding treatment for  possible depression.   The patient may have unrestricted diet and activities with the proviso that  she was advised to use caution if driving with recent use of Compazine which  may have a sedative effect.   DISCHARGE MEDICATIONS:  The patient will resume all home medications which  include:  1. Zyrtec 10 mg daily.  2. Singulair 10 mg daily.  3. Minocycline 100 mg daily for acne.  4. 6-MP 50 mg daily.  5. Imitrex p.r.n.  6. Buspirone 7.5 mg each a.m. and 15 mg at h.s.  7. Compazine p.r.n. for nausea.  A prescription for Compazine tablets 10 mg     #20 with one refill to use one q.6h p.r.n. nausea was provided for the     patient.   CONDITION ON DISCHARGE:   Improved.+                                                Ronald Lobo, M.D.    RB/MEDQ  D:  06/20/2003  T:  06/21/2003  Job:  [id number]   cc:   Jeneen Rinks L. Rolla Flatten., M.D.  [address] [place]  [place]  [place] [address]  Fax: [phone]   Shanda Bumps., M.D.  [place]. [address] [place]  [place] [address]  Fax: [phone]    cc:   Joyice Faster. Rolla Flatten., M.D.  [address], [place]  [place]  [place] [address]  Fax: [phone]   Shanda Bumps., M.D.  [place]. [address] [place]  [place] [address]  Fax: [phone]

## 2011-05-11 NOTE — H&P (Signed)
[name], [name]                 ACCOUNT NO.:  [id number]   MEDICAL RECORD NO.:  [id number]          PATIENT TYPE:  INP   LOCATION:  5731                         FACILITY:  [facility]   PHYSICIAN:  James L. Rolla Flatten., M.D.DATE OF BIRTH:  01-28-1976   DATE OF ADMISSION:  07/05/2005  DATE OF DISCHARGE:                                HISTORY & PHYSICAL   REASON FOR ADMISSION:  Profuse diarrhea, nausea and vomiting, 14 pound  weight loss.   HISTORY:  A very nice 35 year old woman who has had a long history of  Crohn's disease with previous ileocolectomy who has been fairly stable and  been doing quite well in remission with no signs of active Crohn's for  sometime.  She was doing well according to she and her mother until June of  this year when she was at a wedding.  She thinks it was early June or may  have been a little bit later.  She had been having right upper quadrant  pain.  According to the E-chart, had a CT and ultrasound in May with  findings of previous surgery but no abscess or any active Crohn's on the CT  scan.  The ultrasound showed no gallstones, the gallbladder wall did appear  to be thickened and she was tender over this region.  The reason this study  was done apparently was for right upper quadrant pain.  She was placed on  Cipro and got better.  She did well for several weeks and approximately two  weeks later began to gradually have nausea, vomiting, and diarrhea.  This  has gotten progressively worse to the point she is having 14-15 loose watery  stools a day, no bleeding, and is throwing up everything including medicine,  solid foods, and liquids.  She has lost 14 pounds and has become weak and  dizzy, and presented to our office today with a 14 pound weight loss and was  sent to the emergency room  for admission.  The only other problems have  been some headaches after vomiting, they come and go.  When she is not  vomiting, she does not tend to have much in the  way of headaches.   CURRENT MEDICATIONS:  6MP 50 mg daily, BuSpar 15 mg daily, Zyrtec and  Singular.   ALLERGIES:  She is allergic to codeine, Phenergan, and sulfa, and has been  intolerant of Remicade.   PAST MEDICAL HISTORY:  Crohn's disease with ileocolectomy back in 3212  complicated by postoperative infection, intra-abdominal infection, she had  fistulous problems and she was readmitted and operated on again and  underwent fistula between the distal small bowel and proximal colon.  This  was resected with another primary anastomosis.  She was seen briefly in  [facility], had fistulous Crohn's, had Remicade, and had problems with  Remicade, was unable to open her jaw, it was felt she had meningitis  following Remicade and she improved with antibiotics.  She had a history of  kidney stones and had undergone previous cystoscopies and stent placement.  She does have a history  of endometriosis, as well.   FAMILY HISTORY:  Negative for inflammatory bowel disease.   SOCIAL HISTORY:  She is a third grade teacher, does not smoke, drinks beer  occasionally.   REVIEW OF SYMPTOMS:  Remarkable in that she is doing remarkably well with no  diarrhea up until this episode.  The patient does have a history of  depression but this has been well controlled with BuSpar.   PHYSICAL EXAMINATION:  VITAL SIGNS:  Temperature 98.5, blood pressure 97/47, pulse 92, respirations  18.  GENERAL:  Pleasant, alert white female in no acute distress.  HEENT:  Sclerae nonicteric.  NECK:  Supple, no lymphadenopathy, no meningismus.  LUNGS:  Clear.  HEART:  Regular rate and rhythm without murmurs or gallops.  ABDOMEN:  Soft, nondistended with hyperactive bowel sounds and mild, if any,  tenderness.   LABORATORY DATA:  CMP all normal.  White count 5.   ASSESSMENT:  1.  Diarrhea, nausea and vomiting.  The main differential here is a      reactivation of her Crohn's disease, C. diff colitis due to recent  Cipro      use, partial small bowel obstruction.  I think a flexible      sigmoidoscopy/colonoscopy would certainly help with sorting these out.  2.  Recent right upper quadrant pain with thickened gallbladder with      improvement with antibiotics, now asymptomatic in that regard.  3.  Crohn's disease had been in remission until this acute episode.  Will      plan on culturing stools, will check C. diff toxin, plan an unprepped      sigmoidoscopy/colonoscopy tomorrow, rehydrate her, control her nausea,      and empirically start her on Flagyl in case she has C. diff.       JLE/MEDQ  D:  07/05/2005  T:  07/05/2005  Job:  [id number]

## 2011-05-11 NOTE — Discharge Summary (Signed)
   NAME:  [name], [name]                           ACCOUNT NO.:  [id number]   MEDICAL RECORD NO.:  [id number]                   PATIENT TYPE:  INP   LOCATION:  5733                                 FACILITY:  [facility]   PHYSICIAN:  James L. Rolla Flatten., M.D.          DATE OF BIRTH:  [birth year]   DATE OF ADMISSION:  05/25/2003  DATE OF DISCHARGE:  05/27/2003                                 DISCHARGE SUMMARY   ADMISSION DIAGNOSIS:  Left upper quadrant chest pain.   FINAL DIAGNOSIS:  1. Left kidney stone obstructing left ureter.  2. History of Crohn's disease.   BRIEF SUMMARY OF HOSPITALIZATION:  The patient has a known history of  Crohn's that has been quiescent recently.  She came in with severe left  upper quadrant, left chest wall pain.  Extensive outpatient work up  including CAT scan of the abdomen, gallbladder, and Gastrografin enema had  failed to reveal a cause in the emergency room.  She was admitted and given  IV fluids and pain medicine.  Urinalysis did show some hematuria which had  not been present before.  For this reason CT showed some hydronephrosis with  evidence of obstructing stone in the left ureter.  She was quickly feeling  better with the fluids.  She was seen in consultation by Dr. Nani Skillern who felt she could be discharged and this could be dealt with at  a later time.  She could be sent home on pain medication with straining of  urine and this was done.   DISPOSITION:  The patient is discharged home on her home medications  including:  1. 6-MP.  2. Singulair.  3. Claritin.  4. BuSpar,  5. Was given Dilaudid 2 mg 1-2 tablets q.4h for pain.   FOLLOW UP:  She will follow up with Dr. Nani Skillern and with Dr.  Oletta Lamas as planned.                                               James L. Rolla Flatten., M.D.    Jaynie Bream  D:  07/01/2003  T:  07/02/2003  Job:  [id number]   cc:   Shanda Bumps., M.D.  [address] [place]  [place] [address]  Fax: [phone]   Haywood Pao, M.D.  [address]  [place]  [place] [address]  Fax: [phone]    cc:   Shanda Bumps., M.D.  [place]. [address], [address] [place]  [place] [address]  Fax: [phone]   Haywood Pao, M.D.  [address]  [place]  [place] [address]  Fax: [phone]

## 2011-05-11 NOTE — Discharge Summary (Signed)
[name], [name]                 ACCOUNT NO.:  [id number]   MEDICAL RECORD NO.:  [id number]          PATIENT TYPE:  INP   LOCATION:  2122                         FACILITY:  [facility]   PHYSICIAN:  Ronald Lobo, M.D.   DATE OF BIRTH:  Mar 19, 1976   DATE OF ADMISSION:  07/05/2005  DATE OF DISCHARGE:  07/10/2005                                 DISCHARGE SUMMARY   DIAGNOSES:  1.  Nausea, vomiting and abdominal pain; etiology undetermined, resolved.  2.  History of Crohn's ileitis, status post previous TIA resection,      currently inactive.  3.  History of allergies in ASPIRIN and allergies with asthma.   CONSULTATIONS:  None.   COMPLICATIONS DURING ADMISSION:  None.   PROCEDURES:  1.  Upper endoscopy.  2.  Colonoscopy.  3.  Abdominal ultrasound.   HOSPITAL COURSE:  [name] was admitted from the office because of a history  of significant diarrhea, nausea and vomiting, which actually began a couple  weeks prior to admission. The pain was primarily in the right upper  quadrant. [name] was brought into the hospital and given IV fluids and  symptomatic medication.  She underwent endoscopy, which showed no ulcers or  other source of nausea and vomiting, as well as a colonoscopy to her  ileocolonic anastomosis, which showed no evidence of Crohn's activity. An  abdominal ultrasound was negative for stones.   The patient was switched from BuSpar to amitriptyline and about this time,  there was improvement in her symptoms. Her diet was advanced from clear  liquids to solid food, and this was well tolerated. By the time of  discharge, the patient had just minimal residual nausea and some slight  postprandial diarrhea, which is actually her baseline. Discharge home was  felt to be clinically appropriate and the patient herself was requesting  discharge because of her improved status.   DISPOSITION:  The patient is discharged home on a regular diet with light  activity, which she can  gradually increase as tolerated. I have requested  that she arrange a follow-up visit with Dr. Oletta Lamas for sometime in the next  few weeks.   DISCHARGE MEDICATIONS:  The patient will stop the BuSpar that she was  previously taking but will resume her 6NP, in the dose of 50 mg daily. She  will start amitriptyline 50 mg q.h.s. (prescription for #30 with no refills  provided), and also prescription for Compazine 10 mg p.o. q.6 h p.r.n.  nausea was provided  (#10, no refills).   CONDITION ON DISCHARGE:  Improved.       RB/MEDQ  D:  07/10/2005  T:  07/10/2005  Job:  [id number]   cc:   Jeneen Rinks L. Rolla Flatten., M.D.  [address], [place]  [place] [address]  Fax: [phone]   Haywood Pao, M.D.  [address]  [place]  [place] [address]  Fax: [phone]

## 2011-05-11 NOTE — Consult Note (Signed)
[facility]  Patient:    [name], [name]                        MRN: [id number] Proc. Date: 04/16/01 Adm. Date:  [id number] Attending:  Harriette Bouillon CC:         Mayme Genta, M.D.   Consultation Report  REFERRING:  Mayme Genta, M.D.  REASON FOR CONSULTATION:  Crohns ileitis with partial small bowel obstruction refractory to medical management.  HISTORY OF PRESENT ILLNESS:  The patient is a 35 year old white female followed by Dr. Earlie Raveling for the past several months with inflammatory bowel disease and abdominal pain.  The patient first noted significant onset of symptoms in July 2001.  At that time, her gynecologist, Dr. Gus Height, took her to the operating room for laparoscopy for presumed endometriosis; however, postoperatively symptoms persisted.  Finally, a CT scan of the abdomen done in February 2002 demonstrated changes in the terminal ileum. This was noted as thickening of the long segment of the terminal ileum extending to the ileocecal valve region.  Crohns disease was considered a likely etiology.  The patient was subsequently evaluated with a small bowel follow-through on February 10, 2001 at [facility].  This study again demonstrated a thickened distal ileum.  No sinus tract or fistulous communication was identified.  Findings were compatible with Crohns disease. The patient was followed by Dr. Earlie Raveling.  She was treated with anti-inflammatories, as well as Remicade and steroids.  Symptoms persisted. The patient experienced onset of bloody diarrhea.  Subsequent CT scan performed on April 07, 2001 at [facility] again showed distal ileum to be markedly abnormal with wall thickening involving at least 30 cm of bowel.  There was mild dilatation of the small bowel.  There was no abscess or extraluminal gas identified.  The patient was started back on prednisone without significant symptomatic  relief.  The patient was admitted on April 15, 2001 for failure of medical management and general surgery is consulted at this time to consider resection.  PAST MEDICAL HISTORY:  Status post appendectomy in 1992 by Dr. Lindwood Qua, status post laparoscopy by Dr. Gus Height July 2001, status post knee arthroscopy, status post tonsillectomy, history of asthma, history of endometriosis.  MEDICATIONS: 1. Pentasa. 2. Singulair. 3. Zoloft. 4. Minocycline. 5. Zyrtec. 6. Hyoscyamine. 7. Phenergan.  ALLERGIES: 1. SULFA. 2. CODEINE.  SOCIAL HISTORY:  The patient is a Pharmacist, hospital in [place].  She currently lives in [place], [place].  Her home, however, is [place].  She is not a smoker.  She drinks alcohol in moderation.  REVIEW OF SYSTEMS:  Fifteen system review discussed with the patient without significant other positives except as noted above.  PHYSICAL EXAMINATION:  GENERAL:  A 35 year old, thin white female in a hospital bed on the third floor ward at [facility]:  Temperature 98.9, pulse 88, respirations 16, blood pressure 133/80.  HEENT:  Normocephalic, atraumatic.  Sclerae are clear.  Dentition is good. Mucous membranes are moist.  NECK:  Supple without masses.  CHEST:  Clear to auscultation bilaterally.  CARDIAC:  Regular rate and rhythm without murmur.  ABDOMEN:  Mildly distended.  There are well-healed surgical wounds at the umbilicus and suprapubic region, as well as right mid abdomen, consistent with her previous history of laparoscopy and appendectomy.  There are active bowel sounds present on auscultation.  There is diffuse tenderness, which  is maximal in the infraumbilical and right lower quadrant regions.  There are no palpable masses.  There is voluntary guarding.  There is mild rebound tenderness.  EXTREMITIES:  Nontender without edema.  NEUROLOGIC:  The patient is alert and oriented to person, place, and  time without focal neurologic deficit.  LABORATORY DATA:  On admission, April 15, 2001:  White blood cell count 13.4, hemoglobin 12.6, platelet count 337,000.  Electrolytes are normal with a potassium of 3.4 and creatinine of 1.0.  Liver function tests are normal. Coagulation studies show a prothrombin time of 13.8 with an INR of 1.1. Urinalysis does show 11-20 white cells per high-power field and is leukocyte esterase positive.  IMPRESSION:  Crohns ileitis with partial small bowel obstruction refractory to medical management.  PLAN: 1. Agree with admission to [facility] and initiation of intravenous    antibiotics for elevated white blood cell count and urinary tract    infection. 2. We will plan exploratory laparotomy with resection of terminal ileum and    cecum.  This was discussed at length with the patient including the risks    and benefits.  I have discussed hospital stay, recovery time, risk of    transfusion, and risk of need for ostomy.  The patient understands and    wishes to proceed. 3. We will plan OR for Thursday or Friday, April 25 or April 26, as OR    schedule permits.  DD:  04/16/01 TD:  04/16/01 Job: [id number]

## 2011-05-11 NOTE — Discharge Summary (Signed)
NAME:  [name], [name]                     ACCOUNT NO.:  [id number]   MEDICAL RECORD NO.:  [id number]                   PATIENT TYPE:  INP   LOCATION:  5737                                 FACILITY:  [facility]   PHYSICIAN:  Haywood Pao, M.D.            DATE OF BIRTH:  23-Aug-1976   DATE OF ADMISSION:  03/03/2003  DATE OF DISCHARGE:  03/06/2003                                 DISCHARGE SUMMARY   DISCHARGE MEDICATIONS:  Vantin 400 mg b.i.d. x 14 days.  No other changes  from admission medications.   HOSPITAL COURSE:  The patient was admitted after having difficulty with  thickness and fever, mostly complaining of her jaw not opening all the way.  She had Panorex views, as well as CT scan of her hand and neck, which were  unremarkable.  She was initially found to have an elevated white count and  was started on Unasyn.  An infectious disease consult was ordered and the  patient was changed to Rocephin by IV 2 g q.12h., as well as clindamycin.  This patient has a history of Crohn's disease and complications related to  having difficulty with bowel movements recently.  The patient continue to do  well during the course.  The infectious disease team considered doing a  lumbar puncture, but did not do so.  When it was reconsidered, the patient  refused this as well.  The patient was indicated as having possible  meningitis, but not further know as the appropriate testing could not be  done.  It was recommended that she receive either Rocephin or Vantin by  mouth to complete a 14-day course of treatment.  Vantin was opted for per  the ID service given the ease of administration.  The patient's jaw  continued to show signs of improvement.  With no focal infection seen and a  rather decrease in her white count from 11.3 at admission to 5.4 and 4.7 on  the day of discharge, the patient was discharged home.   DISCHARGE LABORATORY DATA:  White count 4.7, hemoglobin 11.6, platelets 295.  The patient had an A1C which was 5.5 and normal.  Sodium 141, potassium 3.6,  glucose 111, BUN 3, creatinine 0.7, calcium 8.3.  The patient had normal  LFTs during the course of her stay and had a normal TSH of 4.981 with the  upper limit being 5.5.  In addition, the patient tested negative for HIV, a  normal a.m. cortisol 7, and a negative urine pregnancy test, drug screen  showed opiates, however, the patient received morphine during her admission,  and a negative antinuclear antibody.  She also had a negative strep screen  during the course of her stay.   FOLLOW-UP:  The patient is to follow up with Jeneen Rinks L. Oletta Lamas, M.D., on  Tuesday, March 09, 2003, for routine GI follow-up and with Haywood Pao, M.D., on March 10, 2003, for medicine follow-up.  ACTIVITY:  She is to be out of work until she is certified to return per  Haywood Pao, M.D.   FINAL DIAGNOSES:  1. Probable meningitis.  2. Jaw pain.  3. Allergies.  4. Anxiety.  5. Crohn's disease.                                              Haywood Pao, M.D.   RWT/MEDQ  D:  03/10/2003  T:  03/11/2003  Job:  [id number]   cc:   Infectious Disease Service

## 2011-05-11 NOTE — Op Note (Signed)
[facility]  Patient:    [name], [name]                          MRN: [id number] Proc. Date: 06/25/01 Attending:  Duane Lope. Parks Neptune, M.D. CC:         Mayme Genta, M.D.  Earnstine Regal, M.D.   Operative Report  OPERATIVE PROCEDURE:  Cystourethroscopy.  SURGEON:  Duane Lope. Parks Neptune, M.D.  ANESTHESIA:  MAC.  ESTIMATED BLOOD LOSS:  Negligible.  TUBES:  None.  COMPLICATIONS:  None.  INDICATIONS FOR PROCEDURE:  [name] is a lovely 35 year old white female, who has Crohns disease and has had an ileocecal resection.  She presents with abdominal pain, nausea and vomiting and was subsequently found to have a pyuria which was cultured negative.  She has undergone a CT scan of the abdomen and pelvis which really revealed no significant urinary tract problems and underwent an IVT which was essentially normal with normal emptying of the upper tracts.  It was felt that cystourethroscopy was indicated.  In addition, she notes that she has some hesitancy of urination.  She has not had prior significant urinary tract problems.  PROCEDURE IN DETAIL:  The patient was placed in the supine position.  After proper MAC analgesia, she was placed in the dorsal lithotomy position and prepped and draped with Betadine in a sterile fashion.  Cystourethroscopy was performed with a 22.5 French Olympus panendoscope utilizing the 12 and 70 degree lenses.  The bladder was carefully inspected.  Efflux of clear urine was noted from the normally placed ureteral orifices bilaterally, and there were no lesions in the bladder or in the urethra.  The bladder was smooth-walled, and the urethra was without inflammation.  The bladder was drained.  The panendoscope was removed, and the patient was taken to the recovery room stable. DD:  06/25/01 TD:  06/25/01 Job: [id number]

## 2011-05-11 NOTE — Consult Note (Signed)
NAME:  [name], [name]                           ACCOUNT NO.:  [id number]   MEDICAL RECORD NO.:  [id number]                   PATIENT TYPE:  INP   LOCATION:  5733                                 FACILITY:  [facility]   PHYSICIAN:  Shanda Bumps., M.D.      DATE OF BIRTH:  1976/06/16   DATE OF CONSULTATION:  DATE OF DISCHARGE:                                   CONSULTATION   REASON FOR CONSULTATION:  Left kidney stone.   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 She went to the  emergency room on May 31 with abdominal pain for a couple of days, and  apparently, her urine was negative at that time, and CT scan was read as  normal. She was given pain medicine, and her nausea was gotten under  control, and she was discharged as an outpatient. She came back the next day  on June 1 with recurrent pain and nausea and vomiting and at this time had  some blood in her urine. Dr. Oletta Lamas was able to get another CT scan done  with and without contrast which now shows a 2 to 3 mm left mid ureteral  stone with mild hydronephrosis. She has not had previous stones in the past,  nor does she have a family history of stone disease.   Past history is significant in that she has had Crohn's disease for a number  of years. Her fiance broke off the wedding arrangements when she was  discovered to have Crohn's years ago. She had a removal of the right cecum  and distal ileum in 2002 which resulted in some fistulas that subsequently  required surgery. This was done by Dr. Jenean Lindau al. She also has history of  endometriosis which can cause pain, and she is on Depo-Provera. Last flare  of her Crohn's was in January. Other note of history of significance, she  was on Remicade for the Crohn's disease, and two months ago was hospitalized  with 103 fever and was presumed to have bacterial meningitis but responded  to the antibiotics, and a spinal tap was never done. She has been doing  fairly well up until this present time. Along with the CT scan, she had a  Gastrografin enema on May 31 which was also read as normal.   She is afebrile, pleasant white female. She is not married. She does not  have children. Other past history as noted by Dr. Oletta Lamas. She is lying  quietly in the right fetal position. She said the pain gets worse when she  lies flat but comes in waves. She has no CVA pain of significance. Abdomen  is soft and benign without tenderness. Liver and spleen are not enlarged.  Scars from previous surgery are noted. Pelvic is deferred at this time. Skin  is warm and dry. Vital signs are stable.  I reviewed her CT scan which showed a small stone with mild hydronephrosis  as above. No other stones seen.   IMPRESSION:  1. Small 2 to 3 mm left mid ureteral stone.  2. Flank pain and hematuria.  3. History of Crohn's disease.  4. History of endometriosis.  5. History of gastrointestinal fistula post surgery.   Recommend now that her nausea has been cured, I would try to get her  drinking as much as possible. Using a heating pad as needed on her back and  will change her from Demerol to Dilaudid p.o. as I think that is a little  better pain medicine as she has trouble with all the codeine derivatives  with nausea and vomiting. If she fairly comfortable,  either tonight in the morning, she could go home with some Dilaudid to  strain her urine and will come back in the office in a week and hopefully  bring the stone with her at that time. She will need an outpatient  evaluation including the stone risk profile to determine why she has the  stones and what we can do to help prevent further attacks.                                               Shanda Bumps., M.D.    HMK/MEDQ  D:  05/26/2003  T:  05/26/2003  Job:  [id number]

## 2011-05-11 NOTE — Op Note (Signed)
[place]  Patient:    [name], [name]                        MRN: [id number] Proc. Date: 11/28/00 Adm. Date:  [id number] Attending:  Gus Height                           Operative Report  PREOPERATIVE DIAGNOSES:       1. Pelvic pain.                               2. Pelvic endometriosis.  POSTOPERATIVE DIAGNOSES:      1. Pelvic endometriosis.                               2. Pelvic adhesions.                               3. Left ovarian cyst.  PROCEDURE:                    1. Diagnostic laparoscopy with laser ablation                                  of endometriosis.                               2. Laser ablation at the uterosacral ligament.                               3. Lysis of adhesions.  SURGEON:                      Gus Height, M.D.  ANESTHESIA:                   General.  COMPLICATIONS:                None.  INDICATIONS:                  The patient is a 35 year old white female with a history of low abdominal pain, especially in the right lower quadrant, worse with menses.  She presents now to undergo a laparoscopy to see if an etiology for this pain can be established and corrected.  The risks and benefits of the procedure have been discussed with the patient.  DESCRIPTION OF PROCEDURE:      The patient was taken to the operating room and placed in a supine position.  General anesthesia was administered without difficulty.  She was then placed in the dorsal lithotomy position and prepped and draped in the usual sterile fashion.  The bladder was catheterized.  A Hulka tenaculum was placed through the cervix, and held.  A small infraumbilical incision was then made.  A Veress needle was inserted, and the abdomen was insufflated with 3 L of CO2 under monitored pressure.  The trocar to the laparoscope was then placed, followed by the laparoscope itself, to allow better visualization.  A second 5.0 mm port was then established  under direct visualization.  A systemic  inspection of the pelvic organs showed the anterior bladder peritoneum to be unremarkable with the round ligament unremarkable.  There was no evidence of any inguinal hernia.  The uterus was midposition, normal size and shape, and unremarkable.  The tubes were inspected.  The tubes were normal along their course.  The fimbria were fine and delicate bilaterally.  There were no adhesions noted about the tube.  The right ovary was inspected and was noted to be within normal limits.  The left ovary was enlarged with an approximately 3.0 cm simple cyst.  No adhesions were noted in this area.  In the cul-de-sac, however, there were typical implants of endometriosis noted in the peritoneum at the cul-de-sac, as well as scarring in the cul-de-sac between the uterosacral ligaments, with filmy adhesions present in this area.  In addition, there was marked thickening of the right uterosacral ligament with typical lesions of endometriosis.  There were some filmy adhesions also present at the patients prior appendectomy site.  The liver and gallbladder were unremarkable.  At this point using the Yag laser with 15 watts of power, and with the GRP-6 tip, the adhesions in the cul-de-sac were taken down.  Then each of the implants visualized was laser ablated without difficulty.  The uterosacral ligaments were then placed on stretch and transected, with care to avoid any vascular injury, and care to avoid the ureters.  On the right side additionally where the implants were noted to be thickening the uterosacral ligaments, this area was treated with the Yag laser without difficulty.  The adhesions on the anterior abdominal wall at the appendectomy site were then taken down without difficulty.  At this point, with no other abnormalities being noted, the procedure was completed. The CO2 was allowed to escape.  All instruments were removed.  The lap counts and instrument  counts were taken and found to be correct.  The abdomen was closed.  The small incisions were closed using subcuticular #4-0 Vicryl.  The patient tolerated the procedure well and went to the recovery room in satisfactory condition.  The estimated blood loss was less than 20 cc.  DISPOSITION:  The patient is to be observed overnight and discharged home on November 29, 2000.  Suppressive therapy for the endometriosis will be instituted on an outpatient basis. DD:  11/28/00 TD:  11/28/00 Job: [id number]

## 2011-05-11 NOTE — Discharge Summary (Signed)
[place]  Patient:    [name], [name]                          MRN: [id number] Adm. Date:  06/21/01 Disc. Date: 06/27/01 Attending:  Mayme Genta, M.D. CC:         Earnstine Regal, M.D.  Gus Height, M.D.  [place] Parks Neptune, M.D.   Discharge Summary  DISCHARGE DIAGNOSES: 1. Abdominal pain. 2. Nausea and vomiting and diarrhea. 3. Urinary hesitancy with pyuria. 4. Constipation. 5. Status post Crohns ileocolitis, resection on 03/30/01.  HOSPITAL COURSE:  The patient was admitted to the hospital with progressive symptoms of nausea, vomiting, diarrhea.  She had got to a point where she was unable to hold down p.o. medications despite the use of Phenergan 25 mg suppositories.  Low grade fever, increasing right lower quadrant pain.  The patient underwent recent ileocecal resection for Crohns disease two months ago.  She was admitted because of the concern that she may have a postoperative complication, e.g. anastomotic leak, postoperative stricture, or possible early recurrent aggressive Crohns disease.  The patient was initially hydrated and treated with IV antiemetics and analgesics.  Her symptoms gradually responded.  She was able to be switched from Demerol to Ultram during the past 24 hours.  Also able to be switched from Zofran to Phenergan.  Abdominal pain gradually subsided, though physical examination shows persistent tenderness in the right lower quadrant.  The patient had no vomiting or diarrhea since admission.  Continued to complain of nausea throughout, even up to the time of discharge.  LABORATORY DATA:  Did not reveal any evidence of acute inflammation or infection.  Urinalysis was the abnormality with findings of marked pyuria. IVP was obtained without evidence of kidney stone or other urologic diagnosis.  Consultation with Dr. Lawerance Bach, urology, was obtained.  Cystoscopy was normal.  The patient was seen by Dr. Armandina Gemma,  surgeon who performed her segmental resection.  He did not think that there was a surgical explanation. A CT scan had been obtained on 06/15/01, and I reviewed this with the radiologist.  No abnormalities were noted except for post-surgical changes. The patient did not manifest progressive symptoms.  As she was started on Flomax for urinary hesitancy which improved symptoms.  Subsequent urine culture did not grow a pathologic bacteria.  Finally, a small bowel series was obtained to further assess possible recurrent Crohns disease, anastomotic stricture, or a leak.  None of these findings were present.  The only abnormality noted was stool filled with colon.  Although she presented with diarrhea, she did not have a bowel movement since admission.  Her physical examination did fluctuate throughout the course of admission with varying degrees of right lower quadrant tenderness and firmness.  In retrospect, this may reflect a degree of stool filled colon.  The patient is discharged in stable condition.  RECOMMENDATION: 1. Miralax one capsule in 8 to 10 fluid ounces b.i.d. - titrate to achieve    daily bowel action. 2. Flomax 0.4 mg q.d. 3. Ultram 50 mg one tablet p.o. q.6h. p.r.n. 4. Continue Zoloft 50 mg daily. 5. Asacol 400 mg two tablets t.i.d.  FOLLOWUP:  Office visit next week.  The patient is to call sooner if she develops recurrent symptoms, fever. DD:  06/27/01 TD:  06/27/01 Job: [id number]

## 2011-06-15 ENCOUNTER — Encounter (INDEPENDENT_AMBULATORY_CARE_PROVIDER_SITE_OTHER): Payer: BC Managed Care – PPO | Admitting: *Deleted

## 2011-06-15 DIAGNOSIS — R55 Syncope and collapse: Secondary | ICD-10-CM

## 2011-07-18 ENCOUNTER — Telehealth: Payer: Self-pay | Admitting: Internal Medicine

## 2011-07-18 ENCOUNTER — Encounter: Payer: Self-pay | Admitting: Internal Medicine

## 2011-07-18 ENCOUNTER — Ambulatory Visit (INDEPENDENT_AMBULATORY_CARE_PROVIDER_SITE_OTHER): Payer: BC Managed Care – PPO | Admitting: Internal Medicine

## 2011-07-18 VITALS — BP 117/85 | HR 119

## 2011-07-18 DIAGNOSIS — R55 Syncope and collapse: Secondary | ICD-10-CM

## 2011-07-18 DIAGNOSIS — R42 Dizziness and giddiness: Secondary | ICD-10-CM

## 2011-07-18 DIAGNOSIS — R5383 Other fatigue: Secondary | ICD-10-CM

## 2011-07-18 DIAGNOSIS — R5381 Other malaise: Secondary | ICD-10-CM

## 2011-07-18 NOTE — Patient Instructions (Addendum)
Your physician recommends that you return for lab work today CBC/BMP/Preg Test/TSH  Increase fluids and salt

## 2011-07-18 NOTE — Progress Notes (Signed)
Addended by: Janan Halter F on: 07/18/2011 05:25 PM   Modules accepted: Orders

## 2011-07-18 NOTE — Progress Notes (Signed)
Loop recorder check in clinic  

## 2011-07-18 NOTE — Telephone Encounter (Signed)
C/O weak this am. Still in bed. Syncope episode last night. No am med's was taken.

## 2011-07-18 NOTE — Progress Notes (Signed)
Addended by: Lorayne Bender on: 07/18/2011 05:14 PM   Modules accepted: Orders

## 2011-07-18 NOTE — Telephone Encounter (Signed)
Spoke with patient  She is going to come over and have her ILR interrogated We will show to DrAllred  If ok will follow up with Dr Acie Fredrickson  Her episode happened last night while waiting in line to eat dinner

## 2011-07-18 NOTE — Progress Notes (Signed)
Addended by: Janan Halter F on: 07/18/2011 05:04 PM   Modules accepted: Orders

## 2011-07-18 NOTE — Progress Notes (Signed)
The patient presents today for urgent electrophysiology followup for symptoms of dizziness.  She reports that yesterday, she was standing in the heat at her husbands work cookout when she became acutely dizzy.  She reports that she sat down and symptoms initially improved.  She continued to have intermittent dizziness as well as mild nausea.  She went home and rested overnight.  This am, she woke and reports feeling as if she were in a "fog".  She reports mild nausea and dizziness.  She has not had syncope.  She reports feeling very weak and fatigued.  She denies symptoms of palpitations, chest pain, shortness of breath, orthopnea, PND, lower extremity edema, head ache, blurred vision, numbness/ weakness, or other neurologic sequela.  The patient feels that she is tolerating medications without difficulties and is otherwise without complaint today.   Past Medical History  Diagnosis Date  . Crohn disease   . Asthma   . Endometriosis   . Cat allergies   . Dysautonomia     recurrent syncoe s/p ILR by Dr Doreatha Lew   Past Surgical History  Procedure Date  . Ileocolectomy   . Loop recorder implant 12/10    by DR Doreatha Lew    Current Outpatient Prescriptions  Medication Sig Dispense Refill  . cetirizine (ZYRTEC) 10 MG tablet Take 10 mg by mouth daily.        . famotidine (PEPCID) 20 MG tablet Take 20 mg by mouth daily.        . Prenatal Vitamins (DIS) TABS Take 1 tablet by mouth daily.        Marland Kitchen zolpidem (AMBIEN) 5 MG tablet Take 5 mg by mouth at bedtime as needed.          Allergies  Allergen Reactions  . Clarithromycin   . Codeine   . Promethazine Hcl   . Sulfonamide Derivatives     History   Social History  . Marital Status: Married    Spouse Name: N/A    Number of Children: N/A  . Years of Education: N/A   Occupational History  . Not on file.   Social History Main Topics  . Smoking status: Never Smoker   . Smokeless tobacco: Not on file  . Alcohol Use: No  . Drug Use: No  .  Sexually Active: Not on file   Other Topics Concern  . Not on file   Social History Narrative  . No narrative on file    Family History  Problem Relation Age of Onset  . Hypertension Mother   . Diabetes Father   . Coronary artery disease Father     ROS-  All systems are reviewed and are negative except as outlined in the HPI above   Physical Exam: There were no vitals filed for this visit.  GEN- The patient is well appearing, alert and oriented x 3 today.   Head- normocephalic, atraumatic Eyes-  Sclera clear, conjunctiva pink Ears- hearing intact Oropharynx- clear with very dry MM Neck- supple, no JVP Lymph- no cervical lymphadenopathy Lungs- Clear to ausculation bilaterally, normal work of breathing Heart- Regular rate and rhythm, no murmurs, rubs or gallops, PMI not laterally displaced GI- soft, NT, ND, + BS Extremities- no clubbing, cyanosis, or edema MS- no significant deformity or atrophy Skin- no rash or lesion Psych- euthymic mood, full affect Neuro- strength and sensation are intact  ekg today reveals sinus rhythm 93 bpm, otherwise normal ekg  ILR is interrogated today and reveals no arrhythmias  Assessment and  Plan:

## 2011-07-18 NOTE — Assessment & Plan Note (Signed)
The patient presents for acute evaluation of dizziness.  She has a history of dysautonomia.  She appears dehydrated on exam.  I suspect that the combination of excessive heat exposure, poor PO hydration, and increased fluid loss with Crohns disease are all responsible for her symptoms. Her ekg and ILR interrogation are without evidence of arrhythmia at this time. She is profoundly orthostatic by heart rate but not by BP. I have encouraged PO hydration and liberalized salt. We will check BMET, CBC, TSH, and HCG today to evaluate for other causes for her symptoms. She has scheduled follow-up with primary care next week. No further CV evaluation is planned at this time. She is encouraged to refrain from driving until symptoms improve.  She should report to ER immediately if her symptoms worsen.

## 2011-07-18 NOTE — Progress Notes (Signed)
Addended by: Thompson Grayer on: 07/18/2011 05:38 PM   Modules accepted: Level of Service

## 2011-07-18 NOTE — Telephone Encounter (Signed)
Labs entered in error  Patient was seen by Dr Rayann Heman and labs ordered on office visit

## 2011-07-18 NOTE — Telephone Encounter (Signed)
Patient is aware that Claiborne Billings will be returning call back .

## 2011-07-18 NOTE — Assessment & Plan Note (Signed)
As above We will check BMET, CBC, TSH, and HCG today.  Adequate rest and hydration advised.

## 2011-07-19 LAB — CBC WITH DIFFERENTIAL/PLATELET
Basophils Absolute: 0.1 10*3/uL (ref 0.0–0.1)
Eosinophils Absolute: 0.1 10*3/uL (ref 0.0–0.7)
Hemoglobin: 14.6 g/dL (ref 12.0–15.0)
Lymphocytes Relative: 20.4 % (ref 12.0–46.0)
MCHC: 33.9 g/dL (ref 30.0–36.0)
Monocytes Relative: 4.3 % (ref 3.0–12.0)
Neutro Abs: 9.3 10*3/uL — ABNORMAL HIGH (ref 1.4–7.7)
Platelets: 375 10*3/uL (ref 150.0–400.0)
RDW: 13.1 % (ref 11.5–14.6)

## 2011-07-19 LAB — TSH: TSH: 0.69 u[IU]/mL (ref 0.35–5.50)

## 2011-07-19 LAB — BASIC METABOLIC PANEL
BUN: 11 mg/dL (ref 6–23)
CO2: 21 mEq/L (ref 19–32)
Calcium: 9.1 mg/dL (ref 8.4–10.5)
Creatinine, Ser: 0.8 mg/dL (ref 0.4–1.2)
GFR: 89.06 mL/min (ref >60.00)
Glucose, Bld: 89 mg/dL (ref 70–99)

## 2011-07-26 ENCOUNTER — Ambulatory Visit: Payer: BC Managed Care – PPO | Admitting: Family Medicine

## 2011-07-27 ENCOUNTER — Encounter: Payer: Self-pay | Admitting: Family Medicine

## 2011-07-27 ENCOUNTER — Ambulatory Visit (INDEPENDENT_AMBULATORY_CARE_PROVIDER_SITE_OTHER): Payer: BC Managed Care – PPO | Admitting: Family Medicine

## 2011-07-27 DIAGNOSIS — Z8669 Personal history of other diseases of the nervous system and sense organs: Secondary | ICD-10-CM

## 2011-07-27 DIAGNOSIS — J4540 Moderate persistent asthma, uncomplicated: Secondary | ICD-10-CM

## 2011-07-27 DIAGNOSIS — R55 Syncope and collapse: Secondary | ICD-10-CM

## 2011-07-27 DIAGNOSIS — K5090 Crohn's disease, unspecified, without complications: Secondary | ICD-10-CM

## 2011-07-27 DIAGNOSIS — J45909 Unspecified asthma, uncomplicated: Secondary | ICD-10-CM

## 2011-07-27 DIAGNOSIS — G43909 Migraine, unspecified, not intractable, without status migrainosus: Secondary | ICD-10-CM

## 2011-07-27 DIAGNOSIS — J309 Allergic rhinitis, unspecified: Secondary | ICD-10-CM | POA: Insufficient documentation

## 2011-07-27 DIAGNOSIS — I499 Cardiac arrhythmia, unspecified: Secondary | ICD-10-CM

## 2011-07-27 DIAGNOSIS — K219 Gastro-esophageal reflux disease without esophagitis: Secondary | ICD-10-CM

## 2011-07-27 DIAGNOSIS — R42 Dizziness and giddiness: Secondary | ICD-10-CM | POA: Insufficient documentation

## 2011-07-27 DIAGNOSIS — N809 Endometriosis, unspecified: Secondary | ICD-10-CM

## 2011-07-27 DIAGNOSIS — M0230 Reiter's disease, unspecified site: Secondary | ICD-10-CM | POA: Insufficient documentation

## 2011-07-27 DIAGNOSIS — N200 Calculus of kidney: Secondary | ICD-10-CM | POA: Insufficient documentation

## 2011-07-27 MED ORDER — OMEPRAZOLE 20 MG PO CPDR: 20.0000 mg | DELAYED_RELEASE_CAPSULE | Freq: Every day | ORAL | Status: DC

## 2011-07-27 MED ORDER — CETIRIZINE HCL 10 MG PO TABS: 10.0000 mg | ORAL_TABLET | Freq: Every day | ORAL | Status: DC

## 2011-07-27 MED ORDER — ZOLPIDEM TARTRATE 10 MG PO TABS: 10.0000 mg | ORAL_TABLET | Freq: Every evening | ORAL | Status: DC | PRN

## 2011-07-27 MED ORDER — ZOLPIDEM TARTRATE 5 MG PO TABS: 5.0000 mg | ORAL_TABLET | Freq: Every evening | ORAL | Status: DC | PRN

## 2011-07-27 NOTE — Progress Notes (Signed)
Subjective:    Patient ID: [name], female    DOB: 07-27-1976, 35 y.o.   MRN: [id number]  HPI  35 year old female her to establish.  Dx crohn's disease diagnosed at age 76.  GI is Dr. Johnnette Gourd Treated with prednisone for flares, as tapering from breast feeding she is restarting 6 MP. Plan is to eventually wean off prednisone. Has associated reactive arthritis during flares.  Episodes of presyncope  Began 09/2009.  Referred to Cardiology. Has loop recorder placed in 2010 Had monitor placed... Dysautomnia felt hormonal given she got pregnant one month later. Episodes started again 1 and 1/2 month ago. [facility] felt  recent episodes possibly due to dehydration. But she is well hydrated now and continues to have symptoms. Nml EKG, no arrythmias on loop rec, nml BMET, TSH, cbc, neg BHCG.   Continues to feels swimmy headed daily since last week.  Feels off balance, room spinning, worse when moving head. She feels this is separate from her presyncopal episodes  She describes the presyncopal as episodes of sweating, heat, legs feel weak, blackness starts coming in edges of eyes. Feels anxious.  Squats and feels better. Notes most before meals, but blood sugar measures okay.  Has never had tilt table test. Has follow up with Dr. Rayann Heman.  She has been under more stress lately.. Changing jobs, had to resign and starting new job.   GERD.Marland Kitchen Clearing throat a lot after eating,  Throat burning at night when lying down.  Taking pepcid daily, not helping.  Never tried PPI>     Review of Systems  Constitutional: Positive for fatigue. Negative for fever.  HENT: Positive for ear pain. Negative for rhinorrhea, sneezing and postnasal drip.        Left ear pain x 2-3 days  Eyes: Negative for photophobia, pain and itching.  Respiratory: Negative for cough, shortness of breath and wheezing.   Cardiovascular: Negative for chest pain, palpitations and leg swelling.  Gastrointestinal:  Positive for abdominal pain.       Current crohn's flare  Neurological: Negative for headaches.       Objective:   Physical Exam  Constitutional: Vital signs are normal. She appears well-developed and well-nourished. She is cooperative.  Non-toxic appearance. She does not appear ill. No distress.  HENT:  Head: Normocephalic.  Right Ear: Hearing, tympanic membrane, external ear and ear canal normal. Tympanic membrane is not erythematous, not retracted and not bulging.  Left Ear: Hearing, tympanic membrane, external ear and ear canal normal. Tympanic membrane is not erythematous, not retracted and not bulging.  Nose: No mucosal edema or rhinorrhea. Right sinus exhibits no maxillary sinus tenderness and no frontal sinus tenderness. Left sinus exhibits no maxillary sinus tenderness and no frontal sinus tenderness.  Mouth/Throat: Uvula is midline, oropharynx is clear and moist and mucous membranes are normal.  Eyes: Conjunctivae, EOM and lids are normal. Pupils are equal, round, and reactive to light. No foreign bodies found.  Neck: Trachea normal and normal range of motion. Neck supple. Carotid bruit is not present. No mass and no thyromegaly present.  Cardiovascular: Normal rate, regular rhythm, S1 normal, S2 normal, normal heart sounds, intact distal pulses and normal pulses.  Exam reveals no gallop and no friction rub.   No murmur heard. Pulmonary/Chest: Effort normal and breath sounds normal. Not tachypneic. No respiratory distress. She has no decreased breath sounds. She has no wheezes. She has no rhonchi. She has no rales.  Abdominal: Soft. Normal appearance and bowel  sounds are normal. There is generalized tenderness.  Neurological: She is alert.  Skin: Skin is warm, dry and intact. No rash noted.  Psychiatric: Her speech is normal and behavior is normal. Judgment and thought content normal. Her mood appears not anxious. Cognition and memory are normal. She does not exhibit a depressed  mood.          Assessment & Plan:

## 2011-07-27 NOTE — Assessment & Plan Note (Signed)
Possible BPPV, although would not explain separate presyncopal spells. Start home desesitization exercises. Info given. Use meclizine prn. Follow up in 2 weeks.

## 2011-07-27 NOTE — Patient Instructions (Addendum)
Stop pepcid, change to prilosec 20 mg -40 mg daily. For 4 to 6 weeks minimum, then try to taper off. Try to decrease acid containing foods, alcohol, caffeine , soda etc.   Possible BPPV, although would not explain separate presyncopal spells. Start home desesitization exercises. Info given. Use meclizine/antivert over the counter as needed but remember this causes sedation and will not make issue go away permenantly faster. Follow up in 2 weeks.  Keep follow up with cardioloigy for presyncopal episodes.  Work on stress reduction relaxation for anxiety and stress. We will discuss again at upcoming appt.

## 2011-08-13 ENCOUNTER — Ambulatory Visit (INDEPENDENT_AMBULATORY_CARE_PROVIDER_SITE_OTHER): Payer: BC Managed Care – PPO | Admitting: Family Medicine

## 2011-08-13 ENCOUNTER — Encounter: Payer: Self-pay | Admitting: Family Medicine

## 2011-08-13 VITALS — BP 112/76 | Temp 99.2°F | Wt 153.0 lb

## 2011-08-13 DIAGNOSIS — R300 Dysuria: Secondary | ICD-10-CM | POA: Insufficient documentation

## 2011-08-13 DIAGNOSIS — Z8669 Personal history of other diseases of the nervous system and sense organs: Secondary | ICD-10-CM

## 2011-08-13 DIAGNOSIS — N3000 Acute cystitis without hematuria: Secondary | ICD-10-CM | POA: Insufficient documentation

## 2011-08-13 DIAGNOSIS — I499 Cardiac arrhythmia, unspecified: Secondary | ICD-10-CM

## 2011-08-13 DIAGNOSIS — R350 Frequency of micturition: Secondary | ICD-10-CM

## 2011-08-13 DIAGNOSIS — R42 Dizziness and giddiness: Secondary | ICD-10-CM

## 2011-08-13 LAB — POCT URINALYSIS DIPSTICK
Ketones, UA: NEGATIVE
Leukocytes, UA: NEGATIVE
Protein, UA: NEGATIVE
Spec Grav, UA: 1.03
Urobilinogen, UA: 0.2
pH, UA: 6

## 2011-08-13 MED ORDER — CIPROFLOXACIN HCL 250 MG PO TABS: 250.0000 mg | ORAL_TABLET | Freq: Two times a day (BID) | ORAL | Status: DC

## 2011-08-13 MED ORDER — CIPROFLOXACIN HCL 250 MG PO TABS: 250.0000 mg | ORAL_TABLET | Freq: Two times a day (BID) | ORAL | Status: AC

## 2011-08-13 NOTE — Patient Instructions (Addendum)
Start Cipro for urinary tract infection. Continue pushing fluids. Stop by front desk for referral to ENT for further vertigo evaluation.  If continued symptoms  and negative vertigo evaluation call Dr. Rayann Heman  for follow up to consider tilt table test to further evaluate symptom

## 2011-08-13 NOTE — Progress Notes (Signed)
Subjective:    Patient ID: [name], female    DOB: [birth year], 35 y.o.   MRN: [id number]  Dysuria  This is a new problem. The current episode started in the past 7 days. The quality of the pain is described as burning. Associated symptoms include chills, frequency and urgency. Pertinent negatives include no discharge, flank pain, hematuria, nausea or vomiting. She has tried acetaminophen for the symptoms. The treatment provided mild relief. Her past medical history is significant for kidney stones and recurrent UTIs. this does not feel like stone to her   She is mainly her for 2 week follow up of possible BPPV and separate syncopal episodes. See bleow summary from previous OV  2 weeks ago.  Episodes of presyncope Began 09/2009.  Referred to Cardiology. Has loop recorder placed in 2010  Had monitor placed... Dysautomnia felt hormonal given she got pregnant one month later.  Episodes started again 1 and 1/2 month ago.  [facility] felt recent episodes possibly due to dehydration. But she is well hydrated now and continues to have symptoms.  Nml EKG, no arrythmias on loop rec, nml BMET, TSH, cbc, neg BHCG.   Continues to feels swimmy headed daily since last week. Feels off balance, room spinning, worse when moving head.  She feels this is separate from her presyncopal episodes  She describes the presyncopal as episodes of sweating, heat, legs feel weak, blackness starts coming in edges of eyes. Feels anxious.  Squats and feels better.  Notes most before meals, but blood sugar measures okay.  Has never had tilt table test.  Has follow up with Dr. Rayann Heman.  She has been under more stress lately.. Changing jobs, had to resign and starting new job.  At last OV she was given home BPPV exercises and meclizine to use prn.  TODAY: she reports continue frequent room spinning spells (feels like body is moving)... Occuring while sitting or standing, not when lying down. She has been  snacking a lot to avoid any possible low blood sugars. No further syncopal episodes.  She has started new job and it is much better. She is fatigued but less stress.      Review of Systems  Constitutional: Positive for chills.  Respiratory: Negative for shortness of breath.   Cardiovascular: Negative for chest pain.  Gastrointestinal: Negative for nausea, vomiting, abdominal pain, diarrhea, constipation, blood in stool and abdominal distention.  Genitourinary: Positive for dysuria, urgency and frequency. Negative for hematuria and flank pain.  Psychiatric/Behavioral: Negative for behavioral problems, dysphoric mood and agitation.       Objective:   Physical Exam  Constitutional: She is oriented to person, place, and time. Vital signs are normal. She appears well-developed and well-nourished. She is cooperative.  Non-toxic appearance. She does not appear ill. No distress.  HENT:  Head: Normocephalic.  Right Ear: Hearing, tympanic membrane, external ear and ear canal normal. Tympanic membrane is not erythematous, not retracted and not bulging.  Left Ear: Hearing, tympanic membrane, external ear and ear canal normal. Tympanic membrane is not erythematous, not retracted and not bulging.  Nose: No mucosal edema or rhinorrhea. Right sinus exhibits no maxillary sinus tenderness and no frontal sinus tenderness. Left sinus exhibits no maxillary sinus tenderness and no frontal sinus tenderness.  Mouth/Throat: Uvula is midline, oropharynx is clear and moist and mucous membranes are normal.  Eyes: Conjunctivae, EOM and lids are normal. Pupils are equal, round, and reactive to light. No foreign bodies found.  Neck: Trachea normal and  normal range of motion. Neck supple. Carotid bruit is not present. No mass and no thyromegaly present.  Cardiovascular: Normal rate, regular rhythm, S1 normal, S2 normal, normal heart sounds, intact distal pulses and normal pulses.  Exam reveals no gallop and no  friction rub.   No murmur heard. Pulmonary/Chest: Effort normal and breath sounds normal. Not tachypneic. No respiratory distress. She has no decreased breath sounds. She has no wheezes. She has no rhonchi. She has no rales.  Abdominal: Soft. Normal appearance and bowel sounds are normal. There is no tenderness.  Neurological: She is alert and oriented to person, place, and time. She has normal strength and normal reflexes. She displays normal reflexes. No cranial nerve deficit. She exhibits normal muscle tone. She displays a negative Romberg sign. Coordination normal.  Skin: Skin is warm, dry and intact. No rash noted.  Psychiatric: Her speech is normal and behavior is normal. Judgment and thought content normal. Her mood appears not anxious. Cognition and memory are normal. She does not exhibit a depressed mood.          Assessment & Plan:

## 2011-08-13 NOTE — Assessment & Plan Note (Signed)
Classic symtpoms of UTI, although urinalysis contaminated. Send for culture but start on Cipro emperically.

## 2011-08-13 NOTE — Assessment & Plan Note (Addendum)
Difficult to assess whether these symptoms are vertigo vs lightheadedness.  She describes as off balance and feels like she is spinning, but better when lying down.  No improvement with home BPPV exercises and time.  Difficult to assess whether these could be associated with her past syncopal spells... These have not recurred in last month. But daily severe vertigo symtpoms.  We will start with referral to ENT to get their opinion as to whether this could be innner ear and if balance retraining would help. If they feel it is not likely... I would next consider tilt table testing through cardiology. Per pt at last OV Dr. Rayann Heman did not think dysautomnia was currently an issue for her.  She denies  anxiety/depression, but this may also be a cause.. She has a very flat affect, distant.  Lab eval has been normal, as well as other cardiac and neuro eval in past although this was for past pregnancy associated syncope.

## 2011-08-14 ENCOUNTER — Encounter: Payer: Self-pay | Admitting: Internal Medicine

## 2011-08-16 LAB — URINE CULTURE: Colony Count: 50000

## 2011-08-20 ENCOUNTER — Other Ambulatory Visit: Payer: Self-pay | Admitting: Otolaryngology

## 2011-08-20 DIAGNOSIS — R42 Dizziness and giddiness: Secondary | ICD-10-CM

## 2011-08-23 ENCOUNTER — Other Ambulatory Visit: Payer: Self-pay | Admitting: *Deleted

## 2011-08-23 DIAGNOSIS — R55 Syncope and collapse: Secondary | ICD-10-CM

## 2011-08-23 MED ORDER — ZOLPIDEM TARTRATE 10 MG PO TABS: 10.0000 mg | ORAL_TABLET | Freq: Every evening | ORAL | Status: DC | PRN

## 2011-08-23 NOTE — Telephone Encounter (Signed)
Rx phoned to pharmacy.  

## 2011-08-24 ENCOUNTER — Other Ambulatory Visit: Payer: Self-pay | Admitting: *Deleted

## 2011-08-24 DIAGNOSIS — R55 Syncope and collapse: Secondary | ICD-10-CM

## 2011-08-24 NOTE — Telephone Encounter (Signed)
Opened in error

## 2011-08-28 ENCOUNTER — Ambulatory Visit
Admission: RE | Admit: 2011-08-28 | Discharge: 2011-08-28 | Disposition: A | Discharge status: Home / Self Care | Payer: BC Managed Care – PPO | Source: Ambulatory Visit | Attending: Otolaryngology | Admitting: Otolaryngology

## 2011-08-28 DIAGNOSIS — R42 Dizziness and giddiness: Secondary | ICD-10-CM

## 2011-08-28 MED ORDER — GADOBENATE DIMEGLUMINE 529 MG/ML IV SOLN
12.0000 mL | Freq: Once | INTRAVENOUS | Status: AC | PRN
  Administered 2011-08-28: 12 mL via INTRAVENOUS

## 2011-08-28 NOTE — Assessment & Plan Note (Signed)
At this point , she feels this is separate form her past syncopal episodes. Describe more like vertigo although not exactly typical. Neg Dix HAllpike on exam. Neg orthostatic. I will have her start with home EPly maneuvers, meclizine prn and close follow up in 2 weeks after obtaining records.

## 2011-08-28 NOTE — Assessment & Plan Note (Signed)
Has seen neuro and cardio in past. Pne eval never done is tilt table test. This may be next step in eval.

## 2011-08-29 LAB — URINALYSIS, MICROSCOPIC ONLY

## 2011-08-30 ENCOUNTER — Telehealth: Payer: Self-pay | Admitting: *Deleted

## 2011-08-30 MED ORDER — SERTRALINE HCL 25 MG PO TABS: 25.0000 mg | ORAL_TABLET | Freq: Every day | ORAL | Status: DC

## 2011-08-30 NOTE — Telephone Encounter (Signed)
Pt states she has discussed her going on anti anxiety medicine with you.  She says she told you she would think about it and now she is ready to try something.  She is becoming very stressed, worried about what is going on with her.  States states starting on anxiety medicine cant hurt her.  [facility] [place]  Please let her know.

## 2011-08-30 NOTE — Telephone Encounter (Signed)
Agreed.. Will start low dose sertraline at 25 mg.. Will likely need to increase, but given at least 3-4 weeks before she call to let us know if it is not improving.

## 2011-08-30 NOTE — Telephone Encounter (Signed)
Patient advised.

## 2011-09-11 ENCOUNTER — Other Ambulatory Visit: Payer: Self-pay | Admitting: *Deleted

## 2011-09-11 DIAGNOSIS — R55 Syncope and collapse: Secondary | ICD-10-CM

## 2011-09-12 MED ORDER — ZOLPIDEM TARTRATE 10 MG PO TABS: 10.0000 mg | ORAL_TABLET | Freq: Every evening | ORAL | Status: DC | PRN

## 2011-09-12 NOTE — Telephone Encounter (Signed)
rx called to pharmacy 

## 2011-09-13 ENCOUNTER — Ambulatory Visit (INDEPENDENT_AMBULATORY_CARE_PROVIDER_SITE_OTHER): Payer: BC Managed Care – PPO | Admitting: Internal Medicine

## 2011-09-13 ENCOUNTER — Encounter: Payer: Self-pay | Admitting: Cardiology

## 2011-09-13 ENCOUNTER — Encounter: Payer: Self-pay | Admitting: Internal Medicine

## 2011-09-13 VITALS — BP 116/86 | HR 96 | Ht 64.5 in | Wt 155.0 lb

## 2011-09-13 DIAGNOSIS — Z8669 Personal history of other diseases of the nervous system and sense organs: Secondary | ICD-10-CM

## 2011-09-13 DIAGNOSIS — R55 Syncope and collapse: Secondary | ICD-10-CM

## 2011-09-13 LAB — CSF CULTURE W GRAM STAIN: Culture: NO GROWTH

## 2011-09-13 LAB — CSF CELL COUNT WITH DIFFERENTIAL
RBC Count, CSF: 2 — ABNORMAL HIGH
Tube #: 1
WBC, CSF: 0
WBC, CSF: 0

## 2011-09-13 LAB — INDIA INK, CSF (CRYPTOCOCCUS) (CONVERTED LAB)

## 2011-09-13 LAB — PROTEIN AND GLUCOSE, CSF: Glucose, CSF: 58

## 2011-09-13 MED ORDER — FLUDROCORTISONE ACETATE 0.1 MG PO TABS: 0.1000 mg | ORAL_TABLET | Freq: Two times a day (BID) | ORAL | Status: DC

## 2011-09-13 NOTE — Assessment & Plan Note (Signed)
Today, I spent over 45 minutes explaining the patient's diagnosis. She has neurally mediated syncope. The episodes are fairly classic. Interrogation of her implantable loop recorder demonstrates no severe bradycardia. This would strongly suggest vasal depression. Today we discussed the strategies to prevent recurrent syncope. She has tried to increase her salt and fluid though I suspect she has not tried quite as hard as she might. Because of her very severe case, I recommended that he try Florinef. We'll start this and plan to see her back in the office in approximately 2 months. I reassured her that her situation is not life-threatening although it can be quite debilitating. She is instructed to maintain a low or no caffeine and alcohol diet. The importance of managing stress has also been discussed.

## 2011-09-13 NOTE — Progress Notes (Signed)
HPI [name] is referred today for evaluation of syncope. She has seen multiple MD's for this problem including 2 cardiologists, a neuro ophthalmologist, an ENT and her primary MD. The patient has had syncope for almost 2 years. The episodes subsided when she was pregnant with her daughter but then resumed thereafter. She had an ILR placed by Dr. Doreatha Lew which has demonstrated no bradycardia so far. She notes that episodes of syncope are always present with standing although she will have spells while sitting. The episodes can be aborted with lying down quickly though she may feel washed out and tired for up to several hours after. No additional complaints.  Allergies  Allergen Reactions  . Clarithromycin   . Codeine   . Promethazine Hcl   . Sulfonamide Derivatives      Current Outpatient Prescriptions  Medication Sig Dispense Refill  . amoxicillin (AMOXIL) 500 MG capsule Take 500 mg by mouth QID.        Marland Kitchen cetirizine (ZYRTEC) 10 MG tablet Take 1 tablet (10 mg total) by mouth daily.  30 tablet  11  . cyanocobalamin (,VITAMIN B-12,) 1000 MCG/ML injection Inject 1,000 mcg into the muscle once a week.       . medroxyPROGESTERone (DEPO-PROVERA) 150 MG/ML injection Inject 150 mg into the muscle every 3 (three) months.        . mercaptopurine (PURINETHOL) 50 MG tablet Take 50 mg by mouth daily. Give on an empty stomach 1 hour before or 2 hours after meals. Caution: Chemotherapy.       Marland Kitchen omeprazole (PRILOSEC) 20 MG capsule Take 1 capsule (20 mg total) by mouth daily.  30 capsule  1  . Prenatal Vitamins (DIS) TABS Take 1 tablet by mouth daily.        . sertraline (ZOLOFT) 25 MG tablet Take 1 tablet (25 mg total) by mouth daily.  30 tablet  5  . zolpidem (AMBIEN) 10 MG tablet Take 1 tablet (10 mg total) by mouth at bedtime as needed.  30 tablet  0     Past Medical History  Diagnosis Date  . Crohn disease   . Asthma   . Endometriosis   . Cat allergies   . Dysautonomia     recurrent syncoe s/p  ILR by Dr Doreatha Lew    ROS:   All systems reviewed and negative except as noted in the HPI.   Past Surgical History  Procedure Date  . Ileocolectomy   . Loop recorder implant 12/10    by DR Doreatha Lew  . Knee surgery 1990    1988  . Cesarean section     2011, emergency  CS due to placental abruption  . Colon surgery 2002    partial removed due to crohns  . Appendectomy   . Tonsilectomy, adenoidectomy, bilateral myringotomy and tubes      Family History  Problem Relation Age of Onset  . Hypertension Mother   . Hyperlipidemia Mother   . Arthritis Mother   . Diabetes Father   . Coronary artery disease Father   . Heart disease Father 34    MI age 80  . Hyperlipidemia Brother   . Hypertension Brother      History   Social History  . Marital Status: Married    Spouse Name: N/A    Number of Children: N/A  . Years of Education: N/A   Occupational History  . Not on file.   Social History Main Topics  . Smoking status: Never Smoker   .  Smokeless tobacco: Never Used  . Alcohol Use: No  . Drug Use: No  . Sexually Active: Yes   Other Topics Concern  . Not on file   Social History Narrative  . No narrative on file     BP 116/86  Pulse 96  Ht 5' 4.5" (1.638 m)  Wt 155 lb (70.308 kg)  BMI 26.19 kg/m2  Physical Exam:  Well appearing NAD HEENT: Unremarkable Neck:  No JVD, no thyromegally Lymphatics:  No adenopathy Back:  No CVA tenderness Lungs:  Clear with no wheezes, rales, or rhonchi. HEART:  Regular rate rhythm, no murmurs, no rubs, no clicks Abd:  soft, positive bowel sounds, no organomegally, no rebound, no guarding Ext:  2 plus pulses, no edema, no cyanosis, no clubbing Skin:  No rashes no nodules Neuro:  CN II through XII intact, motor grossly intact   Assess/Plan:

## 2011-09-13 NOTE — Patient Instructions (Signed)
Your physician recommends that you schedule a follow-up appointment in: 3 months with Dr Lovena Le  Your physician has recommended you make the following change in your medication: start Florinef 0.10m 1/2 tablet at 7am and 2pm for 2 weeks then one tablet twice daily

## 2011-09-18 ENCOUNTER — Other Ambulatory Visit: Payer: Self-pay | Admitting: Family Medicine

## 2011-09-28 ENCOUNTER — Telehealth: Payer: Self-pay | Admitting: *Deleted

## 2011-09-28 MED ORDER — ALPRAZOLAM 0.25 MG PO TABS: ORAL_TABLET | ORAL | Status: DC

## 2011-09-28 MED ORDER — SERTRALINE HCL 50 MG PO TABS: 25.0000 mg | ORAL_TABLET | Freq: Every day | ORAL | Status: DC

## 2011-09-28 NOTE — Telephone Encounter (Signed)
rx called to pharmacy 

## 2011-09-28 NOTE — Telephone Encounter (Signed)
Pt states her cardiologist has found the reason for her syncope- Neurally mediated syncope, possibly brought on by her crohn's, arrhythmia and stress.  She is worried because she has a social function to go to tomorrow and she is afraid she will pass out.  She takes 25 mg's of zoloft daily but asks if you can give her something to get her through tomorrow.  Can you call her to discuss this.  She wanted to come in this afternoon but you dont have anything available.  [facility].

## 2011-09-28 NOTE — Telephone Encounter (Signed)
Discussed with pt in detail. Started on fluocortisone to increase BP. She has been on this 2 weeks, will increase the pill this weekend. Standing is trigger, or social events as well. She has a stressful social event tommorow NICU reunion. She is scared she will pass out.  She is on zoloft 25 She feels that this has helped some. Has used valium for MRI and tolerated this in past.  Vertigo has resolved. Recommended increase in zoloft to 50 mg daily. She will be with husband, he is drincing tommorow. We will try  Low dose xanax prior to event.  She will drink a lot of fluids, eat breakfast etc.

## 2011-10-03 ENCOUNTER — Telehealth: Payer: Self-pay | Admitting: Internal Medicine

## 2011-10-03 NOTE — Telephone Encounter (Signed)
Pt called and said that Dr. Lovena Le had increased her Fludrocortisone and since she started with the increase she has had a bad headache.  Can this be a side effect?  Please call her at [phone].

## 2011-10-03 NOTE — Telephone Encounter (Signed)
Returned call to patient She states since increasing her Florinef to 0.29m bid she has had a headache She increased the dose on Sunday I have asked her  to decrease one of the doses to a half tablet for a week to see if this helps  She is also c/o body aches  This I don't think is a SE of the medication and if this persist she needs to contact her PCP

## 2011-10-05 LAB — URINALYSIS, ROUTINE W REFLEX MICROSCOPIC
Nitrite: NEGATIVE
Nitrite: NEGATIVE
Protein, ur: NEGATIVE
Specific Gravity, Urine: 1.004 — ABNORMAL LOW
Urobilinogen, UA: 0.2
Urobilinogen, UA: 0.2

## 2011-10-05 LAB — CBC
HCT: 36.4
Hemoglobin: 12.3
Hemoglobin: 12.9
Hemoglobin: 13.9
MCHC: 33.7
MCHC: 33.7
MCHC: 33.8
MCHC: 33.9
MCV: 91.2
MCV: 91.2
Platelets: 313
Platelets: 334
RBC: 3.99
RBC: 4.13
RBC: 4.51
RDW: 12.9
RDW: 12.9
RDW: 13
RDW: 13.1

## 2011-10-05 LAB — BASIC METABOLIC PANEL
BUN: 3 — ABNORMAL LOW
CO2: 23
CO2: 25
Calcium: 8.8
Chloride: 108
GFR calc Af Amer: >60
GFR calc non Af Amer: >60
Glucose, Bld: 123 — ABNORMAL HIGH
Glucose, Bld: 167 — ABNORMAL HIGH
Potassium: 4.2
Sodium: 140

## 2011-10-05 LAB — HEPATIC FUNCTION PANEL
Bilirubin, Direct: 0.2
Indirect Bilirubin: 0.7
Total Bilirubin: 0.9

## 2011-10-05 LAB — I-STAT 8, (EC8 V) (CONVERTED LAB)
BUN: 7
Chloride: 110
HCT: 37
Operator id: 284141
pCO2, Ven: 39.4 — ABNORMAL LOW

## 2011-10-05 LAB — COMPREHENSIVE METABOLIC PANEL
ALT: 19
AST: 20
Albumin: 3.4 — ABNORMAL LOW
Calcium: 8.4
GFR calc Af Amer: >60
Sodium: 137
Total Protein: 5.8 — ABNORMAL LOW

## 2011-10-05 LAB — POCT I-STAT CREATININE
Creatinine, Ser: 0.7
Operator id: 284141

## 2011-10-05 LAB — URINE CULTURE

## 2011-10-05 LAB — STOOL CULTURE

## 2011-10-05 LAB — URINE MICROSCOPIC-ADD ON

## 2011-10-05 LAB — DIFFERENTIAL
Basophils Relative: 0
Eosinophils Absolute: 0
Monocytes Absolute: 0.6
Monocytes Relative: 7

## 2011-10-05 LAB — CLOSTRIDIUM DIFFICILE EIA: C difficile Toxins A+B, EIA: NEGATIVE

## 2011-10-05 LAB — OVA AND PARASITE EXAMINATION: Ova and parasites: NONE SEEN

## 2011-10-09 ENCOUNTER — Encounter: Payer: Self-pay | Admitting: Family Medicine

## 2011-10-09 ENCOUNTER — Ambulatory Visit (INDEPENDENT_AMBULATORY_CARE_PROVIDER_SITE_OTHER): Payer: BC Managed Care – PPO | Admitting: Family Medicine

## 2011-10-09 VITALS — BP 112/78 | HR 88 | Temp 98.4°F | Wt 159.8 lb

## 2011-10-09 DIAGNOSIS — J029 Acute pharyngitis, unspecified: Secondary | ICD-10-CM | POA: Insufficient documentation

## 2011-10-09 MED ORDER — PENICILLIN V POTASSIUM 500 MG PO TABS: 500.0000 mg | ORAL_TABLET | Freq: Three times a day (TID) | ORAL | Status: AC

## 2011-10-09 NOTE — Assessment & Plan Note (Signed)
Probably viral but with being on MCP, will cover for bacterial infection. Use PCN V 500 three times a day. Gargle.

## 2011-10-09 NOTE — Progress Notes (Signed)
  Subjective:    Patient ID: [name], female    DOB: [birth year], 35 y.o.   MRN: [id number]  HPI Pt of Dr Rometta Emery here as acute appt for ST while on chemo for crohn's disease. She began with a ST last week and she thought it might be the Fall weather. Then she started a fever to 100.9 and started feeling badly. She has had some chills, mild headache in the frontal side, different from her right sided migraines. She also c/o posterior neck pain. She denies ear pain, nb o rhinitis but significant ST, worst in the AM. It wakes her up at night. She has no cough, no N/V and has diarrhea that is nml for her.     Review of SystemsNoncontributory except as above.       Objective:   Physical Exam  Constitutional: She appears well-developed and well-nourished. No distress.  HENT:  Head: Normocephalic and atraumatic.  Right Ear: External ear normal.  Left Ear: External ear normal.  Nose: Nose normal.  Mouth/Throat: Oropharynx is clear and moist. No oropharyngeal exudate.       Throat without exudates, no overt swelling seen..  Eyes: Conjunctivae and EOM are normal. Pupils are equal, round, and reactive to light.  Neck: Normal range of motion. Neck supple. No thyromegaly present.  Cardiovascular: Normal rate, regular rhythm and normal heart sounds.   Pulmonary/Chest: Effort normal and breath sounds normal. She has no wheezes. She has no rales.  Lymphadenopathy:    She has no cervical adenopathy.  Skin: She is not diaphoretic.          Assessment & Plan:

## 2011-10-09 NOTE — Patient Instructions (Signed)
Strep Test negative. Still due to being on MCP, will treat with Pcn. For soreness of the throat, gargle with warm salt water every half hour for 48 hrs. Come back for worsening sxs.

## 2011-10-25 ENCOUNTER — Other Ambulatory Visit: Payer: Self-pay | Admitting: *Deleted

## 2011-10-25 MED ORDER — SERTRALINE HCL 50 MG PO TABS: 50.0000 mg | ORAL_TABLET | Freq: Every day | ORAL | Status: DC

## 2011-10-26 ENCOUNTER — Other Ambulatory Visit: Payer: Self-pay | Admitting: *Deleted

## 2011-10-26 DIAGNOSIS — R55 Syncope and collapse: Secondary | ICD-10-CM

## 2011-10-26 MED ORDER — ZOLPIDEM TARTRATE 10 MG PO TABS: 10.0000 mg | ORAL_TABLET | Freq: Every evening | ORAL | Status: DC | PRN

## 2011-10-26 NOTE — Telephone Encounter (Signed)
rx called to pharmacy 

## 2011-10-30 ENCOUNTER — Telehealth: Payer: Self-pay | Admitting: *Deleted

## 2011-10-30 MED ORDER — ALPRAZOLAM 0.25 MG PO TABS: ORAL_TABLET | ORAL | Status: DC

## 2011-10-30 NOTE — Telephone Encounter (Signed)
Addended byEliezer Lofts E on: 10/30/2011 02:02 PM   Modules accepted: Orders

## 2011-10-30 NOTE — Telephone Encounter (Signed)
Call  There is a small increased risk of infection when on medication for crohn's. As long as she is asymptomatic, then nothing to worry about. If she gets sick, fever, coughing, etc. Then she needs to be concerned and evaluated.  Will forward to her PCP for xanax management

## 2011-10-30 NOTE — Telephone Encounter (Signed)
Pt states she has been sitting with her aunt at lunch, who now has pseudomonas, and the aunt's caregiver told the patient she should be careful because she has a weakened immune system from taking chemo drugs for Crohn's.  Pt is asking if she should be concerned about this.  Also, she is asking for a refill on xanax, [facility].

## 2011-10-30 NOTE — Telephone Encounter (Signed)
Patient advised.

## 2011-11-23 ENCOUNTER — Other Ambulatory Visit: Payer: Self-pay | Admitting: Family Medicine

## 2011-11-23 ENCOUNTER — Telehealth: Payer: Self-pay | Admitting: Internal Medicine

## 2011-11-23 NOTE — Telephone Encounter (Signed)
Spoke with pt. She reports she has been taking Florinef and has noticed weight gain over last several weeks.  Swelling of fingers.  She states Dr. Lovena Le mentioned there may be another medicine she can try and she is calling for his recommendations.  Also would like to know when she needs to be seen again.  I told pt I would send note to Dr. Lovena Le and Claiborne Billings to review

## 2011-11-23 NOTE — Telephone Encounter (Signed)
Left message to call back  

## 2011-11-23 NOTE — Telephone Encounter (Signed)
Fu call Pt returning your call

## 2011-11-23 NOTE — Telephone Encounter (Signed)
New problem Pt is taking florinef. She wants to talk to you about this.

## 2011-11-27 ENCOUNTER — Other Ambulatory Visit: Payer: Self-pay | Admitting: *Deleted

## 2011-11-27 DIAGNOSIS — R55 Syncope and collapse: Secondary | ICD-10-CM

## 2011-11-27 MED ORDER — ZOLPIDEM TARTRATE 10 MG PO TABS: 10.0000 mg | ORAL_TABLET | Freq: Every evening | ORAL | Status: DC | PRN

## 2011-11-27 NOTE — Telephone Encounter (Signed)
Discussed with Dr Lovena Le He still feels patient should stay on the medication if at all possible  lmom for patient with that information

## 2011-11-27 NOTE — Telephone Encounter (Signed)
Received faxed refill request from pharmacy. Is it okay to refill medication?

## 2011-11-28 NOTE — Telephone Encounter (Signed)
12/28 at 4:15pm for a follow up with Dr Lovena Le

## 2011-12-06 ENCOUNTER — Other Ambulatory Visit: Payer: Self-pay | Admitting: Gastroenterology

## 2011-12-06 DIAGNOSIS — R1011 Right upper quadrant pain: Secondary | ICD-10-CM

## 2011-12-10 ENCOUNTER — Ambulatory Visit
Admission: RE | Admit: 2011-12-10 | Discharge: 2011-12-10 | Disposition: A | Discharge status: Home / Self Care | Payer: BC Managed Care – PPO | Source: Ambulatory Visit | Attending: Gastroenterology | Admitting: Gastroenterology

## 2011-12-10 DIAGNOSIS — R1011 Right upper quadrant pain: Secondary | ICD-10-CM

## 2011-12-20 ENCOUNTER — Encounter: Payer: Self-pay | Admitting: Internal Medicine

## 2011-12-21 ENCOUNTER — Ambulatory Visit (INDEPENDENT_AMBULATORY_CARE_PROVIDER_SITE_OTHER): Payer: BC Managed Care – PPO | Admitting: Internal Medicine

## 2011-12-21 ENCOUNTER — Encounter: Payer: Self-pay | Admitting: Internal Medicine

## 2011-12-21 DIAGNOSIS — Z8669 Personal history of other diseases of the nervous system and sense organs: Secondary | ICD-10-CM

## 2011-12-21 DIAGNOSIS — I499 Cardiac arrhythmia, unspecified: Secondary | ICD-10-CM

## 2011-12-21 NOTE — Patient Instructions (Signed)
Your physician wants you to follow-up in: 6-7 months with Dr Knox Saliva will receive a reminder letter in the mail two months in advance. If you don't receive a letter, please call our office to schedule the follow-up appointment.

## 2011-12-21 NOTE — Assessment & Plan Note (Signed)
No recurrent symptoms. Will check her ILR at the time of her next visit.

## 2011-12-21 NOTE — Progress Notes (Signed)
HPI [name] returns today for followup. She is a pleasant 35 yo woman with a h/o neurally mediated syncope, Crohn disease and is s/p ILR. I started her on florinef when I saw her several months ago. Since then she has not passed out. She has had a few dizzy spells. These resolved with sitting. Her Crohn disease appears to be stable. She has gained one pound since her last visit. She would like to try and get pregnant in about 6 months. Allergies  Allergen Reactions  . Clarithromycin   . Codeine   . Promethazine Hcl   . Sulfonamide Derivatives      Current Outpatient Prescriptions  Medication Sig Dispense Refill  . amoxicillin (AMOXIL) 500 MG capsule Take 500 mg by mouth QID.        Marland Kitchen cetirizine (ZYRTEC) 10 MG tablet Take 1 tablet (10 mg total) by mouth daily.  30 tablet  11  . cyanocobalamin (,VITAMIN B-12,) 1000 MCG/ML injection Inject 1,000 mcg into the muscle once a week.       . fludrocortisone (FLORINEF) 0.1 MG tablet Take 1 tablet (0.1 mg total) by mouth 2 (two) times daily.  60 tablet  6  . medroxyPROGESTERone (DEPO-PROVERA) 150 MG/ML injection Inject 150 mg into the muscle every 3 (three) months.        . mercaptopurine (PURINETHOL) 50 MG tablet Take 50 mg by mouth daily. Give on an empty stomach 1 hour before or 2 hours after meals. Caution: Chemotherapy.       Marland Kitchen omeprazole (PRILOSEC) 20 MG capsule TAKE ONE CAPSULE BY MOUTH DAILY  30 capsule  1  . Prenatal Vitamins (DIS) TABS Take 1 tablet by mouth daily.        . sertraline (ZOLOFT) 50 MG tablet Take 1 tablet (50 mg total) by mouth daily.  30 tablet  3  . zolpidem (AMBIEN) 10 MG tablet Take 1 tablet (10 mg total) by mouth at bedtime as needed.  30 tablet  1  . ALPRAZolam (XANAX) 0.25 MG tablet 1-2 tabs prior to stressful events  30 tablet  0     Past Medical History  Diagnosis Date  . Crohn disease 2000  . Asthma   . Endometriosis   . Cat allergies   . Dysautonomia     recurrent syncoe s/p ILR by Dr [name]     ROS:   All systems reviewed and negative except as noted in the HPI.   Past Surgical History  Procedure Date  . Ileocolectomy   . Loop recorder implant 12/10    by DR [name]  . Knee surgery 1990    1988  . Cesarean section     2011, emergency  CS due to placental abruption  . Colon surgery 2002    partial removed due to crohns  . Appendectomy   . Tonsilectomy, adenoidectomy, bilateral myringotomy and tubes      Family History  Problem Relation Age of Onset  . Hypertension Mother   . Hyperlipidemia Mother   . Arthritis Mother   . Diabetes Father   . Coronary artery disease Father   . Heart disease Father 33    MI age 25  . Hyperlipidemia Brother   . Hypertension Brother      History   Social History  . Marital Status: Married    Spouse Name: N/A    Number of Children: N/A  . Years of Education: N/A   Occupational History  .  Other    circulumn  director   Social History Main Topics  . Smoking status: Never Smoker   . Smokeless tobacco: Never Used  . Alcohol Use: Yes     occasionally  . Drug Use: No  . Sexually Active: Yes   Other Topics Concern  . Not on file   Social History Narrative  . No narrative on file     BP 128/78  Pulse 92  Ht 5' 4.5" (1.638 m)  Wt 70.761 kg (156 lb)  BMI 26.36 kg/m2  Physical Exam:  Well appearing NAD HEENT: Unremarkable Neck:  No JVD, no thyromegally Lymphatics:  No adenopathy Back:  No CVA tenderness Lungs:  Clear with no wheezes, or rales. HEART:  Regular rate rhythm, no murmurs, no rubs, no clicks Abd:  soft, positive bowel sounds, no organomegally, no rebound, no guarding Ext:  2 plus pulses, no edema, no cyanosis, no clubbing Skin:  No rashes no nodules Neuro:  CN II through XII intact, motor grossly intact  Assess/Plan:

## 2011-12-21 NOTE — Assessment & Plan Note (Signed)
Her symptoms appear to be well controlled. I have recommended she continue her florinef and zoloft. When she decides to try and get pregnant, she can stop these meds for a month prior to attempting to get pregnant. She is instructed to continue to increase her sodium intake.

## 2012-01-09 ENCOUNTER — Telehealth: Payer: Self-pay | Admitting: Internal Medicine

## 2012-01-09 NOTE — Telephone Encounter (Signed)
New problem:  Need office note sign by Dr. Lovena Le-  Loop recorder implanted. In order for the medical records release- spoke with stephanie at [facility] office. Patient coming in Friday for surgery.

## 2012-01-09 NOTE — Telephone Encounter (Signed)
All information re-faxed to surgical center with last office note

## 2012-01-18 ENCOUNTER — Other Ambulatory Visit: Payer: Self-pay | Admitting: *Deleted

## 2012-01-18 MED ORDER — ALPRAZOLAM 0.25 MG PO TABS: ORAL_TABLET | ORAL | Status: DC

## 2012-01-18 NOTE — Telephone Encounter (Signed)
rx called to pharmacy 

## 2012-01-21 ENCOUNTER — Telehealth: Payer: Self-pay | Admitting: Internal Medicine

## 2012-01-21 NOTE — Telephone Encounter (Signed)
Pt calling re medication not working on her syncope, pt has had syncopical epidsode 1-24, and has feeling of syncope all week

## 2012-01-21 NOTE — Telephone Encounter (Signed)
Appt made to see Dr Lovena Le  On Feb 26.

## 2012-01-29 ENCOUNTER — Other Ambulatory Visit: Payer: Self-pay | Admitting: *Deleted

## 2012-01-29 DIAGNOSIS — R55 Syncope and collapse: Secondary | ICD-10-CM

## 2012-01-29 MED ORDER — ZOLPIDEM TARTRATE 10 MG PO TABS: 10.0000 mg | ORAL_TABLET | Freq: Every evening | ORAL | Status: DC | PRN

## 2012-01-29 NOTE — Telephone Encounter (Signed)
Ok, #30, 0 refills

## 2012-01-29 NOTE — Telephone Encounter (Signed)
RX CALLED TO PHARMACY

## 2012-02-16 ENCOUNTER — Other Ambulatory Visit: Payer: Self-pay | Admitting: Family Medicine

## 2012-02-19 ENCOUNTER — Ambulatory Visit (INDEPENDENT_AMBULATORY_CARE_PROVIDER_SITE_OTHER): Payer: BC Managed Care – PPO | Admitting: Internal Medicine

## 2012-02-19 ENCOUNTER — Encounter: Payer: Self-pay | Admitting: Internal Medicine

## 2012-02-19 DIAGNOSIS — I499 Cardiac arrhythmia, unspecified: Secondary | ICD-10-CM

## 2012-02-19 DIAGNOSIS — R42 Dizziness and giddiness: Secondary | ICD-10-CM

## 2012-02-19 DIAGNOSIS — Z8669 Personal history of other diseases of the nervous system and sense organs: Secondary | ICD-10-CM

## 2012-02-19 LAB — PACEMAKER DEVICE OBSERVATION

## 2012-02-19 NOTE — Assessment & Plan Note (Signed)
At this point her episodes are moderately well controlled. I am concerned about recurrent syncope when the patient stopped taking Florinef. If she becomes pregnant, neurally mediated syncope usually improves. Unfortunately, it may be a time before she can become pregnant after she has come off of her Florinef. During this time, I have asked the patient increase her sodium intake even more so than she has previously done. In addition, she is instructed to lie down as soon as she feels an episode coming on. In this way, we can hopefully manage her spells without her injuring herself.

## 2012-02-19 NOTE — Patient Instructions (Signed)
Your physician recommends that you schedule a follow-up appointment in: 3 months in the device clinic and 6 months with Dr Lovena Le  Your physician has recommended you make the following change in your medication: 1) Stop Florinef

## 2012-02-19 NOTE — Assessment & Plan Note (Signed)
The patient's symptoms have been reasonably well-controlled. I've asked the patient to ask her obstetrician if trying Zoloft during pregnancy is an option.

## 2012-02-19 NOTE — Progress Notes (Signed)
HPI [name] returns today for followup. She is a very pleasant 36 year old woman with a history of neurally mediated syncope who has been fairly well controlled on Florinef. The patient also has Crohn's disease and endometriosis. She is coming off of her oral contraceptive and is considering trying to get pregnant. She has tried to wean herself off of Florinef and this had to syncopal episodes. She denies chest pain or shortness of breath. Her Crohn disease and endometriosis appear to be well controlled at the present time. Allergies  Allergen Reactions  . Clarithromycin   . Codeine   . Promethazine Hcl   . Sulfonamide Derivatives      Current Outpatient Prescriptions  Medication Sig Dispense Refill  . ALPRAZolam (XANAX) 0.25 MG tablet 1-2 tabs prior to stressful events  30 tablet  0  . cetirizine (ZYRTEC) 10 MG tablet Take 1 tablet (10 mg total) by mouth daily.  30 tablet  11  . cyanocobalamin (,VITAMIN B-12,) 1000 MCG/ML injection Inject 1,000 mcg into the muscle once a week.       . medroxyPROGESTERone (DEPO-PROVERA) 150 MG/ML injection Inject 150 mg into the muscle every 3 (three) months.        . mercaptopurine (PURINETHOL) 50 MG tablet Take 50 mg by mouth daily. Give on an empty stomach 1 hour before or 2 hours after meals. Caution: Chemotherapy.       Marland Kitchen omeprazole (PRILOSEC) 20 MG capsule       . Prenatal Vitamins (DIS) TABS Take 1 tablet by mouth daily.        . sertraline (ZOLOFT) 50 MG tablet TAKE 1 TABLET (50 MG TOTAL) BY MOUTH DAILY.  30 tablet  3  . zolpidem (AMBIEN) 10 MG tablet Take 1 tablet (10 mg total) by mouth at bedtime as needed.  30 tablet  0     Past Medical History  Diagnosis Date  . Crohn disease 2000  . Asthma   . Endometriosis   . Cat allergies   . Dysautonomia     recurrent syncoe s/p ILR by [name]    ROS:   All systems reviewed and negative except as noted in the HPI.   Past Surgical History  Procedure Date  . Ileocolectomy   . Loop  recorder implant 12/10    by [name]  . Knee surgery 1990    1988  . Cesarean section     2011, emergency  CS due to placental abruption  . Colon surgery 2002    partial removed due to crohns  . Appendectomy   . Tonsilectomy, adenoidectomy, bilateral myringotomy and tubes      Family History  Problem Relation Age of Onset  . Hypertension Mother   . Hyperlipidemia Mother   . Arthritis Mother   . Diabetes Father   . Coronary artery disease Father   . Heart disease Father 20    MI age 59  . Hyperlipidemia Brother   . Hypertension Brother      History   Social History  . Marital Status: Married    Spouse Name: N/A    Number of Children: N/A  . Years of Education: N/A   Occupational History  .  Other    Airline pilot   Social History Main Topics  . Smoking status: Never Smoker   . Smokeless tobacco: Never Used  . Alcohol Use: Yes     occasionally  . Drug Use: No  . Sexually Active: Yes   Other Topics  Concern  . Not on file   Social History Narrative  . No narrative on file     BP 126/80  Pulse 94  Wt 72.757 kg (160 lb 6.4 oz)  Physical Exam:  Well appearing 36 year old woman, NAD HEENT: Unremarkable Neck:  No JVD, no thyromegally Lungs:  Clear with no wheezes, rales, or rhonchi. No increased work of breathing. HEART:  Regular rate rhythm, no murmurs, no rubs, no clicks Abd:  soft, positive bowel sounds, no organomegally, no rebound, no guarding Ext:  2 plus pulses, no edema, no cyanosis, no clubbing Skin:  No rashes no nodules Neuro:  CN II through XII intact, motor grossly intact   DEVICE  Normal device function.  See PaceArt for details. No bradycardia episodes were present during her syncopal episodes.  Assess/Plan:

## 2012-02-20 ENCOUNTER — Telehealth: Payer: Self-pay | Admitting: Internal Medicine

## 2012-02-20 NOTE — Telephone Encounter (Signed)
New Problem   Patient reports she is going to continue with Depo Prevara and Florines, if there are any additional questions give patient a call at hm# [phone]

## 2012-02-26 ENCOUNTER — Other Ambulatory Visit: Payer: Self-pay | Admitting: *Deleted

## 2012-02-26 DIAGNOSIS — R55 Syncope and collapse: Secondary | ICD-10-CM

## 2012-02-26 MED ORDER — ALPRAZOLAM 0.25 MG PO TABS: ORAL_TABLET | ORAL | Status: DC

## 2012-02-26 MED ORDER — ZOLPIDEM TARTRATE 10 MG PO TABS: 10.0000 mg | ORAL_TABLET | Freq: Every evening | ORAL | Status: DC | PRN

## 2012-02-26 NOTE — Telephone Encounter (Signed)
rx called to pharmacy 

## 2012-03-13 ENCOUNTER — Encounter (HOSPITAL_COMMUNITY): Payer: Self-pay | Admitting: Emergency Medicine

## 2012-03-13 ENCOUNTER — Observation Stay (HOSPITAL_COMMUNITY)
Admission: EM | Admit: 2012-03-13 | Discharge: 2012-03-16 | Disposition: A | Discharge status: Home / Self Care | Payer: BC Managed Care – PPO | Attending: Internal Medicine | Admitting: Internal Medicine

## 2012-03-13 ENCOUNTER — Emergency Department (HOSPITAL_COMMUNITY): Payer: BC Managed Care – PPO

## 2012-03-13 ENCOUNTER — Other Ambulatory Visit: Payer: Self-pay

## 2012-03-13 DIAGNOSIS — J4540 Moderate persistent asthma, uncomplicated: Secondary | ICD-10-CM

## 2012-03-13 DIAGNOSIS — J45909 Unspecified asthma, uncomplicated: Secondary | ICD-10-CM | POA: Insufficient documentation

## 2012-03-13 DIAGNOSIS — N200 Calculus of kidney: Secondary | ICD-10-CM

## 2012-03-13 DIAGNOSIS — M0230 Reiter's disease, unspecified site: Secondary | ICD-10-CM

## 2012-03-13 DIAGNOSIS — N3000 Acute cystitis without hematuria: Secondary | ICD-10-CM

## 2012-03-13 DIAGNOSIS — R5383 Other fatigue: Secondary | ICD-10-CM

## 2012-03-13 DIAGNOSIS — J029 Acute pharyngitis, unspecified: Secondary | ICD-10-CM

## 2012-03-13 DIAGNOSIS — R42 Dizziness and giddiness: Secondary | ICD-10-CM | POA: Insufficient documentation

## 2012-03-13 DIAGNOSIS — K5090 Crohn's disease, unspecified, without complications: Secondary | ICD-10-CM

## 2012-03-13 DIAGNOSIS — I499 Cardiac arrhythmia, unspecified: Secondary | ICD-10-CM

## 2012-03-13 DIAGNOSIS — K219 Gastro-esophageal reflux disease without esophagitis: Secondary | ICD-10-CM

## 2012-03-13 DIAGNOSIS — R55 Syncope and collapse: Principal | ICD-10-CM | POA: Insufficient documentation

## 2012-03-13 DIAGNOSIS — R300 Dysuria: Secondary | ICD-10-CM

## 2012-03-13 DIAGNOSIS — R112 Nausea with vomiting, unspecified: Secondary | ICD-10-CM | POA: Insufficient documentation

## 2012-03-13 DIAGNOSIS — Z8669 Personal history of other diseases of the nervous system and sense organs: Secondary | ICD-10-CM

## 2012-03-13 DIAGNOSIS — G909 Disorder of the autonomic nervous system, unspecified: Secondary | ICD-10-CM | POA: Insufficient documentation

## 2012-03-13 DIAGNOSIS — H8110 Benign paroxysmal vertigo, unspecified ear: Secondary | ICD-10-CM | POA: Diagnosis present

## 2012-03-13 LAB — POCT I-STAT, CHEM 8
Calcium, Ion: 1.24 mmol/L (ref 1.12–1.32)
Chloride: 109 mEq/L (ref 96–112)
Creatinine, Ser: 0.8 mg/dL (ref 0.50–1.10)
Glucose, Bld: 106 mg/dL — ABNORMAL HIGH (ref 70–99)
Potassium: 4.2 mEq/L (ref 3.5–5.1)

## 2012-03-13 LAB — URINALYSIS, ROUTINE W REFLEX MICROSCOPIC
Bilirubin Urine: NEGATIVE
Glucose, UA: NEGATIVE mg/dL
Ketones, ur: NEGATIVE mg/dL
Leukocytes, UA: NEGATIVE
Nitrite: NEGATIVE
Protein, ur: NEGATIVE mg/dL
pH: 6 (ref 5.0–8.0)

## 2012-03-13 MED ORDER — ONDANSETRON HCL 4 MG/2ML IJ SOLN
4.0000 mg | Freq: Once | INTRAMUSCULAR | Status: AC
  Administered 2012-03-13: 4 mg via INTRAVENOUS
  Filled 2012-03-13: qty 2

## 2012-03-13 MED ORDER — LORAZEPAM 2 MG/ML IJ SOLN: 1.0000 mg | Freq: Once | INTRAMUSCULAR | Status: DC

## 2012-03-13 MED ORDER — ONDANSETRON HCL 4 MG PO TABS: 4.0000 mg | ORAL_TABLET | Freq: Four times a day (QID) | ORAL | Status: AC

## 2012-03-13 MED ORDER — MECLIZINE HCL 25 MG PO TABS
25.0000 mg | ORAL_TABLET | Freq: Once | ORAL | Status: AC
  Administered 2012-03-13: 25 mg via ORAL
  Filled 2012-03-13: qty 1

## 2012-03-13 MED ORDER — GADOBENATE DIMEGLUMINE 529 MG/ML IV SOLN
15.0000 mL | Freq: Once | INTRAVENOUS | Status: AC
  Administered 2012-03-13: 15 mL via INTRAVENOUS

## 2012-03-13 MED ORDER — DIAZEPAM 5 MG/ML IJ SOLN
5.0000 mg | Freq: Once | INTRAMUSCULAR | Status: AC
  Administered 2012-03-13: 5 mg via INTRAVENOUS
  Filled 2012-03-13: qty 2

## 2012-03-13 MED ORDER — SODIUM CHLORIDE 0.9 % IV BOLUS (SEPSIS)
1000.0000 mL | Freq: Once | INTRAVENOUS | Status: AC
  Administered 2012-03-13: 1000 mL via INTRAVENOUS

## 2012-03-13 MED ORDER — LORAZEPAM 2 MG/ML IJ SOLN
1.0000 mg | Freq: Once | INTRAMUSCULAR | Status: AC
  Administered 2012-03-13: 1 mg via INTRAVENOUS
  Filled 2012-03-13: qty 1

## 2012-03-13 MED ORDER — MECLIZINE HCL 25 MG PO TABS: 25.0000 mg | ORAL_TABLET | Freq: Four times a day (QID) | ORAL | Status: DC

## 2012-03-13 MED ORDER — LORAZEPAM 2 MG/ML IJ SOLN
INTRAMUSCULAR | Status: AC
  Administered 2012-03-13: 1 mg
  Filled 2012-03-13: qty 1

## 2012-03-13 NOTE — ED Provider Notes (Signed)
Medical screening examination/treatment/procedure(s) were performed by non-physician practitioner and as supervising physician I was immediately available for consultation/collaboration.   Maudry Diego, MD 03/13/12 [phone]

## 2012-03-13 NOTE — ED Provider Notes (Addendum)
History     CSN: [id number]  Arrival date & time 03/13/12  0704   First MD Initiated Contact with Patient 03/13/12 [phone]      Chief Complaint  Patient presents with  . Dizziness    (Consider location/radiation/quality/duration/timing/severity/associated sxs/prior treatment) Patient is a 36 y.o. female presenting with weakness. The history is provided by the patient.  Weakness The primary symptoms include dizziness, nausea and vomiting. Primary symptoms do not include fever. The symptoms began 2 to 6 hours ago. The symptoms are unchanged. The neurological symptoms are diffuse.  Dizziness also occurs with nausea and vomiting.  Associated symptoms comments: She got up from bed early this morning and experienced severe dizziness, like the room was moving around and around. She had nausea with vomiting. Symptoms are worse when she tries to stand. No history of similar symptoms..    Past Medical History  Diagnosis Date  . Crohn disease 2000  . Asthma   . Endometriosis   . Cat allergies   . Dysautonomia     recurrent syncoe s/p ILR by Dr Doreatha Lew    Past Surgical History  Procedure Date  . Ileocolectomy   . Loop recorder implant 12/10    by DR Doreatha Lew  . Knee surgery 1990    1988  . Cesarean section     2011, emergency  CS due to placental abruption  . Colon surgery 2002    partial removed due to crohns  . Appendectomy   . Tonsilectomy, adenoidectomy, bilateral myringotomy and tubes     Family History  Problem Relation Age of Onset  . Hypertension Mother   . Hyperlipidemia Mother   . Arthritis Mother   . Diabetes Father   . Coronary artery disease Father   . Heart disease Father 51    MI age 26  . Hyperlipidemia Brother   . Hypertension Brother     History  Substance Use Topics  . Smoking status: Never Smoker   . Smokeless tobacco: Never Used  . Alcohol Use: Yes     occasionally    OB History    Grav Para Term Preterm Abortions TAB SAB Ect Mult Living               Review of Systems  Constitutional: Negative for fever and chills.  HENT: Negative.   Respiratory: Negative.   Cardiovascular: Negative.   Gastrointestinal: Positive for nausea and vomiting.  Musculoskeletal: Negative.   Skin: Negative.   Neurological: Positive for dizziness.    Allergies  Biaxin; Codeine; Hydrocodone; Promethazine hcl; and Sulfonamide derivatives  Home Medications   Current Outpatient Rx  Name Route Sig Dispense Refill  . ALPRAZOLAM 0.25 MG PO TABS  1-2 tabs prior to stressful events 30 tablet 0  . BUTALBITAL-APAP-CAFFEINE 50-325-40 MG PO TABS Oral Take 1 tablet by mouth 2 (two) times daily as needed. For migraines    . CETIRIZINE HCL 10 MG PO TABS Oral Take 1 tablet (10 mg total) by mouth daily. 30 tablet 11  . CYANOCOBALAMIN 1000 MCG/ML IJ SOLN Intramuscular Inject 1,000 mcg into the muscle once a week.     Marland Kitchen MEDROXYPROGESTERONE ACETATE 150 MG/ML IM SUSP Intramuscular Inject 150 mg into the muscle every 3 (three) months.      . MERCAPTOPURINE 50 MG PO TABS Oral Take 50 mg by mouth daily. Give on an empty stomach 1 hour before or 2 hours after meals. Caution: Chemotherapy.     . OMEPRAZOLE 20 MG PO CPDR      .  PRENATAL VITAMINS (DIS) PO TABS Oral Take 1 tablet by mouth daily.      . SERTRALINE HCL 50 MG PO TABS  TAKE 1 TABLET (50 MG TOTAL) BY MOUTH DAILY. 30 tablet 3  . ZOLPIDEM TARTRATE 10 MG PO TABS Oral Take 10 mg by mouth at bedtime. Scheduled.      BP 119/70  Temp(Src) 98.2 F (36.8 C) (Oral)  Resp 16  SpO2 100%  Physical Exam  Constitutional: She is oriented to person, place, and time. She appears well-developed and well-nourished.  HENT:  Head: Normocephalic.  Eyes: Pupils are equal, round, and reactive to light.  Neck: Normal range of motion. Neck supple.  Cardiovascular: Normal rate and regular rhythm.   Pulmonary/Chest: Effort normal and breath sounds normal.  Abdominal: Soft. Bowel sounds are normal. There is no tenderness.  There is no rebound and no guarding.  Musculoskeletal: Normal range of motion.  Neurological: She is alert and oriented to person, place, and time. She has normal strength and normal reflexes. No cranial nerve deficit or sensory deficit. She displays a negative Romberg sign. Coordination normal.       Significant recurrent dizziness with sitting up in bed. Did not attempt standing.  Skin: Skin is warm and dry. No rash noted.  Psychiatric: She has a normal mood and affect.    ED Course  Procedures (including critical care time) Results for orders placed during the hospital encounter of 03/13/12  URINALYSIS, ROUTINE W REFLEX MICROSCOPIC      Component Value Range   Color, Urine YELLOW  YELLOW    APPearance CLEAR  CLEAR    Specific Gravity, Urine 1.012  1.005 - 1.030    pH 6.0  5.0 - 8.0    Glucose, UA NEGATIVE  NEGATIVE (mg/dL)   Hgb urine dipstick NEGATIVE  NEGATIVE    Bilirubin Urine NEGATIVE  NEGATIVE    Ketones, ur NEGATIVE  NEGATIVE (mg/dL)   Protein, ur NEGATIVE  NEGATIVE (mg/dL)   Urobilinogen, UA 0.2  0.0 - 1.0 (mg/dL)   Nitrite NEGATIVE  NEGATIVE    Leukocytes, UA NEGATIVE  NEGATIVE   PREGNANCY, URINE      Component Value Range   Preg Test, Ur NEGATIVE  NEGATIVE   POCT I-STAT, CHEM 8      Component Value Range   Sodium 143  135 - 145 (mEq/L)   Potassium 4.2  3.5 - 5.1 (mEq/L)   Chloride 109  96 - 112 (mEq/L)   BUN 9  6 - 23 (mg/dL)   Creatinine, Ser 0.80  0.50 - 1.10 (mg/dL)   Glucose, Bld 106 (*) 70 - 99 (mg/dL)   Calcium, Ion 1.24  1.12 - 1.32 (mmol/L)   TCO2 22  0 - 100 (mmol/L)   Hemoglobin 14.3  12.0 - 15.0 (g/dL)   HCT 42.0  36.0 - 46.0 (%)    Labs Reviewed  POCT I-STAT, CHEM 8 - Abnormal; Notable for the following:    Glucose, Bld 106 (*)    All other components within normal limits  URINALYSIS, ROUTINE W REFLEX MICROSCOPIC  PREGNANCY, URINE   No results found.   No diagnosis found. 1. Vertigo   MDM  She feels improved with Meclizine but symptoms  not resolved. She feels comfortable with discharge home. Attempt at ambulating the patient failed secondary to intense dizziness and nausea with simply sitting up in bed. Will place in CDU for additional fluids, more meclizine, will try IV Ativan as well and observe for symptom improvement.  Leotis Shames, PA-C 03/13/12 1043  Leotis Shames, PA-C 03/13/12 1205

## 2012-03-13 NOTE — ED Notes (Signed)
Pt. States she has a syncope neurally mediated recorder.

## 2012-03-13 NOTE — ED Provider Notes (Signed)
Patient in CDU for treatment of vertigo.   Patient is currently able to turn head from side to side without return of symptoms, but is unable to rise more than about 20 degrees without return of symptoms.  Ativan seems to have lessened initial symptoms.    7:03 PM Patient is still unable to rise more than 20 degrees without return of symptoms.  Discussed with Dr. Donell Sievert obtain MRI and redose with ativan.  Anticipate admission if symptoms remain uncontrolled.  11:20 PM Radiologist called with MRI results--no significant findings to explain symptoms.  11:58 PM Discussed with hospitalist, will admit  Norman Herrlich, NP 03/13/12 2358

## 2012-03-13 NOTE — ED Provider Notes (Signed)
Medical screening examination/treatment/procedure(s) were conducted as a shared visit with non-physician practitioner(s) and myself.  I personally evaluated the patient during the encounter   Maudry Diego, MD 03/13/12 1157

## 2012-03-13 NOTE — Discharge Instructions (Signed)
FOLLOW UP WITH YOUR DOCTOR FOR RECHECK IN 2 DAYS, SOONER WITH ANY WORSENING SYMPTOMS. RETURN HERE AS NEEDED. TAKE MEDICATIONS AS PRESCRIBED.   Benign Positional Vertigo Vertigo means you feel like you or your surroundings are moving when they are not. Benign positional vertigo is the most common form of vertigo. Benign means that the cause of your condition is not serious. Benign positional vertigo is more common in older adults. CAUSES  Benign positional vertigo is the result of an upset in the labyrinth system. This is an area in the middle ear that helps control your balance. This may be caused by a viral infection, head injury, or repetitive motion. However, often no specific cause is found. SYMPTOMS  Symptoms of benign positional vertigo occur when you move your head or eyes in different directions. Some of the symptoms may include:  Loss of balance and falls.   Vomiting.   Blurred vision.   Dizziness.   Nausea.   Involuntary eye movements (nystagmus).  DIAGNOSIS  Benign positional vertigo is usually diagnosed by physical exam. If the specific cause of your benign positional vertigo is unknown, your caregiver may perform imaging tests, such as magnetic resonance imaging (MRI) or computed tomography (CT). TREATMENT  Your caregiver may recommend movements or procedures to correct the benign positional vertigo. Medicines such as meclizine, benzodiazepines, and medicines for nausea may be used to treat your symptoms. In rare cases, if your symptoms are caused by certain conditions that affect the inner ear, you may need surgery. HOME CARE INSTRUCTIONS   Follow your caregiver's instructions.   Move slowly. Do not make sudden body or head movements.   Avoid driving.   Avoid operating heavy machinery.   Avoid performing any tasks that would be dangerous to you or others during a vertigo episode.   Drink enough fluids to keep your urine clear or pale yellow.  SEEK IMMEDIATE MEDICAL  CARE IF:   You develop problems with walking, weakness, numbness, or using your arms, hands, or legs.   You have difficulty speaking.   You develop severe headaches.   Your nausea or vomiting continues or gets worse.   You develop visual changes.   Your family or friends notice any behavioral changes.   Your condition gets worse.   You have a fever.   You develop a stiff neck or sensitivity to light.  MAKE SURE YOU:   Understand these instructions.   Will watch your condition.   Will get help right away if you are not doing well or get worse.  Document Released: 09/17/2006 Document Revised: 11/29/2011 Document Reviewed: 08/30/2011 [facility] Patient Information 2012 [place].

## 2012-03-13 NOTE — ED Notes (Signed)
Pt. woke up at 5 am this morning to go to restroom , reports dizziness / vertigo , denies loc , no sob or pain .

## 2012-03-13 NOTE — ED Provider Notes (Signed)
12:56 PM Patient is in CDU holding for symptomatic treatment of peripheral vertigo.  Sign out received from Gardiner Fanti, PA-C.  Patient reports minimal improvement with meclizine.  Declines sitting up currently given symptoms, states that slight turn of her head causes symptoms to return.  Her nausea has resolved.  Will try ativan and continue to follow.    2:59 PM Patient reports some improvement with ativan.  She is now able to turn her head from side to side.  Attempted to sit up and patient was asymptomatic for approximately one second before developing acute onset dizziness.  Will repeat ativan.  Patient requesting food.  Will request meal for patient, continued IVF.    Patient with peripheral vertigo, improving with ativan.  D/C papers preprinted by Gardiner Fanti, PA-C.  I would suggest adding ativan if patient is discharged home.  Discussed plan with patient - anticipate d/c home if patient is able to improve enough to ambulate comfortably.    3:29 PM Discussed patient with Etta Quill, NP, who assumes care of patient at change of shift.    Otis Brace, [place] 03/13/12 1529

## 2012-03-14 ENCOUNTER — Encounter (HOSPITAL_COMMUNITY): Payer: Self-pay | Admitting: Internal Medicine

## 2012-03-14 MED ORDER — ONDANSETRON HCL 4 MG PO TABS: 4.0000 mg | ORAL_TABLET | Freq: Four times a day (QID) | ORAL | Status: DC | PRN

## 2012-03-14 MED ORDER — WHITE PETROLATUM GEL
Status: AC
  Administered 2012-03-14: 22:00:00
  Filled 2012-03-14: qty 5

## 2012-03-14 MED ORDER — PANTOPRAZOLE SODIUM 40 MG PO TBEC
40.0000 mg | DELAYED_RELEASE_TABLET | Freq: Every day | ORAL | Status: DC
  Administered 2012-03-14 – 2012-03-15 (×2): 40 mg via ORAL
  Filled 2012-03-14 (×2): qty 1

## 2012-03-14 MED ORDER — LORAZEPAM 2 MG/ML IJ SOLN
1.0000 mg | INTRAMUSCULAR | Status: DC | PRN
  Administered 2012-03-14 – 2012-03-16 (×9): 1 mg via INTRAVENOUS
  Filled 2012-03-14 (×9): qty 1

## 2012-03-14 MED ORDER — MECLIZINE HCL 25 MG PO TABS
25.0000 mg | ORAL_TABLET | Freq: Three times a day (TID) | ORAL | Status: DC | PRN
  Administered 2012-03-14 – 2012-03-16 (×6): 25 mg via ORAL
  Filled 2012-03-14 (×6): qty 1

## 2012-03-14 MED ORDER — LORATADINE 10 MG PO TABS
10.0000 mg | ORAL_TABLET | Freq: Every day | ORAL | Status: DC
  Administered 2012-03-14 – 2012-03-15 (×2): 10 mg via ORAL
  Filled 2012-03-14 (×4): qty 1

## 2012-03-14 MED ORDER — MERCAPTOPURINE 50 MG PO TABS
50.0000 mg | ORAL_TABLET | Freq: Every day | ORAL | Status: DC
  Administered 2012-03-14 – 2012-03-15 (×2): 50 mg via ORAL
  Filled 2012-03-14 (×3): qty 1

## 2012-03-14 MED ORDER — ONDANSETRON HCL 4 MG/2ML IJ SOLN
4.0000 mg | Freq: Four times a day (QID) | INTRAMUSCULAR | Status: DC | PRN
  Administered 2012-03-14 – 2012-03-15 (×3): 4 mg via INTRAVENOUS
  Filled 2012-03-14 (×3): qty 2

## 2012-03-14 MED ORDER — ZOLPIDEM TARTRATE 5 MG PO TABS
10.0000 mg | ORAL_TABLET | Freq: Every day | ORAL | Status: DC
  Administered 2012-03-14 – 2012-03-15 (×2): 10 mg via ORAL
  Filled 2012-03-14: qty 2
  Filled 2012-03-14 (×2): qty 1

## 2012-03-14 MED ORDER — PRENATAL MULTIVITAMIN CH
1.0000 | ORAL_TABLET | Freq: Every day | ORAL | Status: DC
  Administered 2012-03-15: 1 via ORAL
  Filled 2012-03-14 (×3): qty 1

## 2012-03-14 MED ORDER — SERTRALINE HCL 50 MG PO TABS
50.0000 mg | ORAL_TABLET | Freq: Every day | ORAL | Status: DC
  Administered 2012-03-14 – 2012-03-15 (×2): 50 mg via ORAL
  Filled 2012-03-14 (×3): qty 1

## 2012-03-14 MED ORDER — PRENATAL VITAMINS (DIS) PO TABS: 1.0000 | ORAL_TABLET | Freq: Every day | ORAL | Status: DC

## 2012-03-14 MED ORDER — DEXTROSE-NACL 5-0.9 % IV SOLN
INTRAVENOUS | Status: DC
  Administered 2012-03-14: 07:00:00 via INTRAVENOUS

## 2012-03-14 NOTE — ED Notes (Signed)
Report called to Izora Gala RN on floor--will call to move patient when bed cleaned.

## 2012-03-14 NOTE — H&P (Signed)
PCP:   Eliezer Lofts, MD, MD   Chief Complaint: Vertigo, nausea and vomiting.   HPI: [name] is an 36 y.o. female with history of Crohn disease, asthma, dysautonomia with dizziness, currently has a loop recorder, Crohn's disease, presents to the emergency room complaining of vertigo. She had upper respiratory infection symptoms last week and was placed on Zithromax. She also has severe nausea and vomiting. She had vertigo with head movement. She has no headache or tinnitus. No photophobia or stiff neck. Workup in emergency room included a negative urinalysis and negative urine pregnancy test. Her electrolytes were unremarkable. She was given symptomatic treatment in the CDU, but was not able to ambulate. For unclear reason, an MRI of the head was performed which was negative. Hospitalist was asked to admit her for symptomatic treatment of peripheral vertigo.  Rewiew of Systems:  The patient denies anorexia, fever, weight loss,, vision loss, decreased hearing, hoarseness, chest pain, syncope, dyspnea on exertion, peripheral edema, balance deficits, hemoptysis, abdominal pain, melena, hematochezia, severe indigestion/heartburn, hematuria, incontinence, genital sores, muscle weakness, suspicious skin lesions, transient blindness, depression, unusual weight change, abnormal bleeding, enlarged lymph nodes, angioedema, and breast masses.   Past Medical History  Diagnosis Date  . Crohn disease 2000  . Asthma   . Endometriosis   . Cat allergies   . Dysautonomia     recurrent syncoe s/p ILR by Dr Doreatha Lew    Past Surgical History  Procedure Date  . Ileocolectomy   . Loop recorder implant 12/10    by DR Doreatha Lew  . Knee surgery 1990    1988  . Cesarean section     2011, emergency  CS due to placental abruption  . Colon surgery 2002    partial removed due to crohns  . Appendectomy   . Tonsilectomy, adenoidectomy, bilateral myringotomy and tubes     Medications:  HOME MEDS: Prior to  Admission medications   Medication Sig Start Date End Date Taking? Authorizing Provider  ALPRAZolam Duanne Moron) 0.25 MG tablet 1-2 tabs prior to stressful events 02/26/12  Yes Amy E Bedsole, MD  butalbital-acetaminophen-caffeine (FIORICET, ESGIC) 50-325-40 MG per tablet Take 1 tablet by mouth 2 (two) times daily as needed. For migraines   Yes Historical Provider, MD  cetirizine (ZYRTEC) 10 MG tablet Take 1 tablet (10 mg total) by mouth daily. 07/27/11  Yes Amy Cletis Athens, MD  cyanocobalamin (,VITAMIN B-12,) 1000 MCG/ML injection Inject 1,000 mcg into the muscle once a week.    Yes Historical Provider, MD  medroxyPROGESTERone (DEPO-PROVERA) 150 MG/ML injection Inject 150 mg into the muscle every 3 (three) months.     Yes Historical Provider, MD  mercaptopurine (PURINETHOL) 50 MG tablet Take 50 mg by mouth daily. Give on an empty stomach 1 hour before or 2 hours after meals. Caution: Chemotherapy.    Yes Historical Provider, MD  omeprazole (PRILOSEC) 20 MG capsule  11/23/11  Yes Amy Cletis Athens, MD  Prenatal Vitamins (DIS) TABS Take 1 tablet by mouth daily.     Yes Historical Provider, MD  sertraline (ZOLOFT) 50 MG tablet TAKE 1 TABLET (50 MG TOTAL) BY MOUTH DAILY. 02/16/12  Yes Amy Cletis Athens, MD  zolpidem (AMBIEN) 10 MG tablet Take 10 mg by mouth at bedtime. Scheduled.   Yes Historical Provider, MD  meclizine (ANTIVERT) 25 MG tablet Take 1 tablet (25 mg total) by mouth 4 (four) times daily. 03/13/12 03/23/12  Leotis Shames, PA-C  ondansetron (ZOFRAN) 4 MG tablet Take 1 tablet (4 mg  total) by mouth every 6 (six) hours. 03/13/12 03/20/12  Leotis Shames, PA-C     Allergies:  Allergies  Allergen Reactions  . Biaxin Other (See Comments)    Reacts with chron's disease  . Codeine Nausea And Vomiting  . Hydrocodone Nausea And Vomiting  . Promethazine Hcl Other (See Comments)    Muscular seizures  . Sulfonamide Derivatives Other (See Comments)    Internal bleeding    Social History:   reports that she has  never smoked. She has never used smokeless tobacco. She reports that she drinks alcohol. She reports that she does not use illicit drugs.  Family History: Family History  Problem Relation Age of Onset  . Hypertension Mother   . Hyperlipidemia Mother   . Arthritis Mother   . Diabetes Father   . Coronary artery disease Father   . Heart disease Father 4    MI age 90  . Hyperlipidemia Brother   . Hypertension Brother      Physical Exam: Filed Vitals:   03/13/12 1253 03/13/12 1633 03/13/12 1849 03/13/12 2319  BP: 112/74 111/78 109/72 100/56  Pulse: 94 98 88 86  Temp: 98.4 F (36.9 C) 98.7 F (37.1 C) 99.1 F (37.3 C) 98.3 F (36.8 C)  TempSrc: Oral Oral Oral Oral  Resp: 16 16 12 16   SpO2: 100% 99% 99% 99%   Blood pressure 100/56, pulse 86, temperature 98.3 F (36.8 C), temperature source Oral, resp. rate 16, SpO2 99.00%.  GEN:  Pleasant person lying in the stretcher in no acute distress; cooperative with exam PSYCH:  alert and oriented x4; does not appear anxious does not appear depressed; affect is normal HEENT: Mucous membranes pink and anicteric; PERRLA; EOM intact; no cervical lymphadenopathy nor thyromegaly or carotid bruit; no JVD; Breasts:: Not examined CHEST WALL: No tenderness CHEST: Normal respiration, clear to auscultation bilaterally HEART: Regular rate and rhythm; no murmurs rubs or gallops BACK: No kyphosis or scoliosis; no CVA tenderness ABDOMEN: Obese, soft non-tender; no masses, no organomegaly, normal abdominal bowel sounds; no pannus; no intertriginous candida. Rectal Exam: Not done EXTREMITIES: No bone or joint deformity; age-appropriate arthropathy of the hands and knees; no edema; no ulcerations. Genitalia: not examined PULSES: 2+ and symmetric SKIN: Normal hydration no rash or ulceration CNS: Cranial nerves 2-12 grossly intact no focal neurologic deficit. Cerebellar exam is negative.   Labs & Imaging Results for orders placed during the hospital  encounter of 03/13/12 (from the past 48 hour(s))  URINALYSIS, ROUTINE W REFLEX MICROSCOPIC     Status: Normal   Collection Time   03/13/12  8:21 AM      Component Value Range Comment   Color, Urine YELLOW  YELLOW     APPearance CLEAR  CLEAR     Specific Gravity, Urine 1.012  1.005 - 1.030     pH 6.0  5.0 - 8.0     Glucose, UA NEGATIVE  NEGATIVE (mg/dL)    Hgb urine dipstick NEGATIVE  NEGATIVE     Bilirubin Urine NEGATIVE  NEGATIVE     Ketones, ur NEGATIVE  NEGATIVE (mg/dL)    Protein, ur NEGATIVE  NEGATIVE (mg/dL)    Urobilinogen, UA 0.2  0.0 - 1.0 (mg/dL)    Nitrite NEGATIVE  NEGATIVE     Leukocytes, UA NEGATIVE  NEGATIVE  MICROSCOPIC NOT DONE ON URINES WITH NEGATIVE PROTEIN, BLOOD, LEUKOCYTES, NITRITE, OR GLUCOSE <1000 mg/dL.  PREGNANCY, URINE     Status: Normal   Collection Time   03/13/12  8:21 AM      Component Value Range Comment   Preg Test, Ur NEGATIVE  NEGATIVE    POCT I-STAT, CHEM 8     Status: Abnormal   Collection Time   03/13/12  8:41 AM      Component Value Range Comment   Sodium 143  135 - 145 (mEq/L)    Potassium 4.2  3.5 - 5.1 (mEq/L)    Chloride 109  96 - 112 (mEq/L)    BUN 9  6 - 23 (mg/dL)    Creatinine, Ser 0.80  0.50 - 1.10 (mg/dL)    Glucose, Bld 106 (*) 70 - 99 (mg/dL)    Calcium, Ion 1.24  1.12 - 1.32 (mmol/L)    TCO2 22  0 - 100 (mmol/L)    Hemoglobin 14.3  12.0 - 15.0 (g/dL)    HCT 42.0  36.0 - 46.0 (%)    No results found.    Assessment Present on Admission:  .Dysautomnia .Vertigo .Dizziness   PLAN: Peripheral vertigo, admit for symptomatic treatment. We'll give her antiemetics, IV fluid, and benzodiazepine. I have continued all other medications. She is stable, full code, and will be admitted to triad hospitalist service.   Other plans as per orders.    Jeffrie Lofstrom 03/14/2012, 12:50 AM

## 2012-03-14 NOTE — ED Provider Notes (Signed)
Medical screening examination/treatment/procedure(s) were performed by non-physician practitioner and as supervising physician I was immediately available for consultation/collaboration.   Maudry Diego, MD 03/14/12 [phone]

## 2012-03-14 NOTE — Progress Notes (Signed)
Utilization Review Completed.Donne Anon T3/22/2013

## 2012-03-14 NOTE — Progress Notes (Signed)
Patient admitted this morning with a severe case of vertigo. She feels a little better at this time. Will continue when necessary meclizine and Ativan. Started her regular diet. No nausea or vomiting at this time. Decrease IV fluids to KVO. Patient had MRI done that was negative for any acute finding. Patient most likely can be discharged tomorrow to followup outpatient with her primary care physician.

## 2012-03-14 NOTE — ED Notes (Signed)
Room 5125 clean and patient moved.

## 2012-03-14 NOTE — Progress Notes (Signed)
Client has had ativan, zofran and first dose of meclizine today without any relief of vertigo. Still feels nausea when she attempts to sit up, but no vomiting. Keeping down clear liquids in small amounts. Increased vertigo and nausea with any attempt to set up.

## 2012-03-15 DIAGNOSIS — H8110 Benign paroxysmal vertigo, unspecified ear: Secondary | ICD-10-CM | POA: Diagnosis present

## 2012-03-15 NOTE — Progress Notes (Addendum)
Physical Therapy Treatment Patient Details Name: CYRAH MCLAMB MRN: [id number] DOB: 03-08-1976 Today's Date: 03/15/2012  PT Assessment/Plan  PT - Assessment/Plan Comments on Treatment Session: 36 year old admitted for sudden onset vertigo with position change. Performed Semont maneuver for stuck debris in canal as earlier session did not seem to help. Subsequently performed canalith repositioning maneuver for Rt. BPPV. Pt was left with imbalance and was nauseated however reporting no vertigo with sit to/from stand and with pivot transfer. Pt also had no vertigo with sit to Rt. sidelying. Pt reports this is better than before treatment, hopefully the imbalance will resolve in a few hours. Pt given Nestor Lewandowsky Exercises to perform later tonight and tomorrow. Handout given.  Pt continues to be unable to ambulate, husband requesting pt stay tonight.  PT Plan: Discharge plan remains appropriate PT Frequency: Min 5X/week Follow Up Recommendations: Outpatient PT ([facility](vestibular)) Equipment Recommended: Rolling walker with 5" wheels;3 in 1 bedside comode;Tub/shower seat (pending progress) PT Goals  Acute Rehab PT Goals Pt will Roll Supine to Right Side: with modified independence;Other (comment) (with no c/o dizziness) PT Goal: Rolling Supine to Right Side - Progress: Progressing toward goal Pt will Roll Supine to Left Side: with modified independence;Other (comment) (with no c/o dizziness) PT Goal: Rolling Supine to Left Side - Progress: Progressing toward goal Pt will go Supine/Side to Sit: Independently;Other (comment) (with no c/o dizziness) PT Goal: Supine/Side to Sit - Progress: Progressing toward goal Pt will Sit at [facility] of Bed: Independently;with no upper extremity support;Other (comment) (with no c/o dizziness) PT Goal: Sit at [facility] Of Bed - Progress: Progressing toward goal Pt will go Sit to Supine/Side: Independently;Other (comment) (with no c/o  dizziness) PT Goal: Sit to Supine/Side - Progress: Progressing toward goal Pt will go Sit to Stand: with modified independence;Other (comment) (with no c/o dizziness) PT Goal: Sit to Stand - Progress: Progressing toward goal Pt will go Stand to Sit: with modified independence;Other (comment) (with no c/o dizziness) PT Goal: Stand to Sit - Progress: Progressing toward goal  PT Treatment Precautions/Restrictions  Precautions Precautions: Fall Precaution Comments: Significant vertigo Required Braces or Orthoses: No Restrictions Weight Bearing Restrictions: No Mobility (including Balance) Bed Mobility Bed Mobility: Yes Rolling Left: 4: Min assist Rolling Left Details (indicate cue type and reason): During vestibular treatment Right Sidelying to Sit: 4: Min assist Right Sidelying to Sit Details (indicate cue type and reason): During vestibular treatment Left Sidelying to Sit: 4: Min assist Left Sidelying to Sit Details (indicate cue type and reason): During vestibular treatment Transfers Transfers: Yes (pt unable to tolerate today) Sit to Stand: 4: Min assist Sit to Stand Details (indicate cue type and reason): Cues for slow movement. no c/o vertigo with movement, only imbalance Stand to Sit: 4: Min assist Stand to Sit Details: Cues for slow movement. no c/o vertigo with movement, only imbalance Stand Pivot Transfers: 4: Min assist Stand Pivot Transfer Details (indicate cue type and reason): Cues for slow movement. no c/o vertigo with movement, only imbalance Ambulation/Gait Ambulation/Gait: No    Exercise  Other Exercises Other Exercises: Performed Semont Maneuver for Rt. Canal BPPV x1 rep. Other Exercises: Performed Canalith Repositioning Technique for the Rt. posterior canal x 1 rep.  End of Session PT - End of Session Equipment Utilized During Treatment: Gait belt Activity Tolerance: Other (comment) (limited by nausea and vertigo) Patient left: in bed;with call bell in  reach;with family/visitor present Nurse Communication: Mobility status for transfers General Behavior During Session: [facility] for  tasks performed Cognition: [facility] for tasks performed  Lahoma Rocker 03/15/2012, 6:44 PM  Jarrett Soho (Atilano Median) Oswaldo Conroy PT, DPT Acute Rehabilitation [phone]

## 2012-03-15 NOTE — Progress Notes (Signed)
Subjective: Patient still complaining of being dizzy but improved from yesterday.  No other specific complaints.  Objective: Vital signs in last 24 hours: Filed Vitals:   03/14/12 1323 03/14/12 2140 03/15/12 0610 03/15/12 1342  BP: 122/81 111/76 124/85 115/74  Pulse: 102 97 91 102  Temp: 98.8 F (37.1 C) 98.9 F (37.2 C) 98.4 F (36.9 C) 98.7 F (37.1 C)  TempSrc: Oral Oral Oral Oral  Resp: 18 18 16 22   Height:      Weight:      SpO2: 100% 100% 100% 100%   Weight change:   Intake/Output Summary (Last 24 hours) at 03/15/12 1510 Last data filed at 03/15/12 0534  Gross per 24 hour  Intake  794.6 ml  Output      0 ml  Net  794.6 ml    Physical Exam: General: Awake, Oriented, No acute distress. HEENT: EOMI, nystagmus noted with changing of patient's position. Neck: Supple CV: S1 and S2 Lungs: Clear to ascultation bilaterally Abdomen: Soft, Nontender, Nondistended, +bowel sounds. Ext: Good pulses. Trace edema.  Lab Results:  [facility] 03/13/12 0841  NA 143  K 4.2  CL 109  CO2 --  GLUCOSE 106*  BUN 9  CREATININE 0.80  CALCIUM --  MG --  PHOS --   No results found for this basename: AST:2,ALT:2,ALKPHOS:2,BILITOT:2,PROT:2,ALBUMIN:2 in the last 72 hours No results found for this basename: LIPASE:2,AMYLASE:2 in the last 72 hours  Basename 03/13/12 0841  WBC --  NEUTROABS --  HGB 14.3  HCT 42.0  MCV --  PLT --   No results found for this basename: CKTOTAL:3,CKMB:3,CKMBINDEX:3,TROPONINI:3 in the last 72 hours No components found with this basename: POCBNP:3 No results found for this basename: DDIMER:2 in the last 72 hours No results found for this basename: HGBA1C:2 in the last 72 hours No results found for this basename: CHOL:2,HDL:2,LDLCALC:2,TRIG:2,CHOLHDL:2,LDLDIRECT:2 in the last 72 hours No results found for this basename: TSH,T4TOTAL,FREET3,T3FREE,THYROIDAB in the last 72 hours No results found for this basename:  VITAMINB12:2,FOLATE:2,FERRITIN:2,TIBC:2,IRON:2,RETICCTPCT:2 in the last 72 hours  Micro Results: No results found for this or any previous visit (from the past 240 hour(s)).  Studies/Results: Mr Jeri Cos TH Contrast  03/14/2012  *RADIOLOGY REPORT*  Clinical Data: 36 year old female with dizziness upon waking. Fall, nausea, vomiting.  MRI HEAD WITHOUT AND WITH CONTRAST  Technique:  Multiplanar, multiecho pulse sequences of the brain and surrounding structures were obtained according to standard protocol without and with intravenous contrast  Contrast: 24m MULTIHANCE GADOBENATE DIMEGLUMINE 529 MG/ML IV SOLN  Comparison: 08/28/2011 and earlier.  Findings: Normal cerebral volume. No restricted diffusion to suggest acute infarction.  No midline shift, mass effect, evidence of mass lesion, ventriculomegaly, extra-axial collection or acute intracranial hemorrhage.  Cervicomedullary junction and pituitary are within normal limits.  Major intracranial vascular flow voids are preserved.  Stable and normal gray and white matter signal throughout the brain.  Mild motion artifact on postcontrast images, No abnormal enhancement identified.  Grossly normal visualized internal auditory structures.  Visualized orbit soft tissues are within normal limits.  Visualized paranasal sinuses and mastoids are clear.  Negative visualized cervical spine. Visualized bone marrow signal is within normal limits.  Negative scalp soft tissues.  IMPRESSION: Stable and normal MRI appearance of the brain.  Original Report Authenticated By: HRandall An M.D.    Medications: I have reviewed the patient's current medications. Scheduled Meds:   . loratadine  10 mg Oral Daily  . mercaptopurine  50 mg Oral Daily  . pantoprazole  40 mg Oral Q1200  . prenatal multivitamin  1 tablet Oral Daily  . sertraline  50 mg Oral Daily  . white petrolatum      . zolpidem  10 mg Oral QHS   Continuous Infusions:   . dextrose 5 % and 0.9% NaCl 20  mL/hr (03/14/12 1400)   PRN Meds:.LORazepam, meclizine, ondansetron (ZOFRAN) IV, ondansetron  Assessment/Plan: Vertigo/dizziness Likely due to benign paroxysmal positional vertigo.  UA negative.  MRI of the brain on 03/13/2012 showed stable and normal appearance of the brain.  Given vestibular symptoms physical therapy has been consulted.  History of syncope Neurally mediated syncope.  Management by Dr. Lovena Le, cardiology as outpatient.  Dysautomnia Management by Dr. Lovena Le.  History of Crohn's disease Stable  Prophylaxis SCDs  Disposition Pending improvement in dizziness.  PT evaluation.    LOS: 2 days  Madiha Bambrick A, MD 03/15/2012, 3:10 PM

## 2012-03-15 NOTE — Evaluation (Signed)
Physical Therapy Evaluation Patient Details Name: [name] MRN: [id number] DOB: 16-Sep-1976 Today's Date: 03/15/2012  Problem List:  Patient Active Problem List  Diagnoses  . SYNCOPE, HX OF  . Dizziness  . Fatigue  . Crohn's disease  . Reactive arthritis due to crohn's flares  . Dysautomnia  . Asthma, moderate persistent  . GERD (gastroesophageal reflux disease)  . Allergic rhinitis  . Endometriosis  . Migraine  . Calcium nephrolithiasis  . Vertigo  . Dysuria  . Cystitis, acute  . Pharyngitis    Past Medical History:  Past Medical History  Diagnosis Date  . Crohn disease 2000  . Asthma   . Endometriosis   . Cat allergies   . Dysautonomia     recurrent syncoe s/p ILR by Dr Doreatha Lew   Past Surgical History:  Past Surgical History  Procedure Date  . Ileocolectomy   . Loop recorder implant 12/10    by DR Doreatha Lew  . Knee surgery 1990    1988  . Cesarean section     2011, emergency  CS due to placental abruption  . Colon surgery 2002    partial removed due to crohns  . Appendectomy   . Tonsilectomy, adenoidectomy, bilateral myringotomy and tubes     PT Assessment/Plan/Recommendation PT Assessment Clinical Impression Statement: 36 year old admitted for sudden onset vertigo with position change. Prior to session pt unable to sit upright. Unable to perform full visual testing secondary to vertigo however was able to perform Bil. Hallipike Dix and roll testing for horizontal canal. Pt with negative findings for both Lt. Hallpike Dix and roll test. Pt with significant Rt. torsional Rt. upbeating nystagmus with Rt. hallpike dix testing with duration <60sec. Performed CRP for Rt. posterior canal x2. Pt became nauseated after last treatment with minimal emesis. Will check back with pt later today.  Pt would likely benefit from another day in hospital as she is currently unable to ambulate and would be a safety risk with return home unless has marked improvement this  afternoon.  PT Recommendation/Assessment: Patient will need skilled PT in the acute care venue PT Problem List: Decreased balance;Decreased mobility;Decreased knowledge of use of DME;Other (comment) (Vertigo, BPPV) Barriers to Discharge: Decreased caregiver support Barriers to Discharge Comments: Pt has a mother who could care for her although her mother also is caring for the pt's young child. PT Therapy Diagnosis : Difficulty walking;Other (comment) (BPPV) PT Plan PT Frequency: Min 5X/week PT Treatment/Interventions: DME instruction;Gait training;Stair training;Functional mobility training;Therapeutic activities;Therapeutic exercise;Balance training;Patient/family education PT Recommendation Follow Up Recommendations: Outpatient PT [facility] Outpatient Neurorehabilitation(Vestibular Rehab)) Equipment Recommended: Rolling walker with 5" wheels;3 in 1 bedside comode;Tub/shower seat (pending progress) PT Goals  Acute Rehab PT Goals PT Goal Formulation: With patient Time For Goal Achievement: 7 days Pt will Roll Supine to Right Side: with modified independence;Other (comment) (with no c/o dizziness) PT Goal: Rolling Supine to Right Side - Progress: Goal set today Pt will Roll Supine to Left Side: with modified independence;Other (comment) (with no c/o dizziness) PT Goal: Rolling Supine to Left Side - Progress: Goal set today Pt will go Supine/Side to Sit: Independently;Other (comment) (with no c/o dizziness) PT Goal: Supine/Side to Sit - Progress: Goal set today Pt will Sit at [facility] of Bed: Independently;with no upper extremity support;Other (comment) (with no c/o dizziness) PT Goal: Sit at [facility] Of Bed - Progress: Goal set today Pt will go Sit to Supine/Side: Independently;Other (comment) (with no c/o dizziness) PT Goal: Sit to Supine/Side - Progress:  Goal set today Pt will go Sit to Stand: with modified independence;Other (comment) (with no c/o dizziness) PT Goal: Sit to Stand - Progress:  Goal set today Pt will go Stand to Sit: with modified independence;Other (comment) (with no c/o dizziness) PT Goal: Stand to Sit - Progress: Goal set today Pt will Ambulate: 51 - 150 feet;with least restrictive assistive device;with supervision;Other (comment) (with no c/o dizziness) PT Goal: Ambulate - Progress: Goal set today Pt will Go Up / Down Stairs: 1-2 stairs;with supervision;with least restrictive assistive device;Other (comment) (with no c/o dizziness) PT Goal: Up/Down Stairs - Progress: Goal set today  PT Evaluation Precautions/Restrictions  Precautions Precautions: Fall Precaution Comments: Significant vertigo Required Braces or Orthoses: No Restrictions Weight Bearing Restrictions: No Prior Functioning  Home Living Lives With: Spouse Type of Home: House Home Layout: One level Home Access: Stairs to enter Entrance Stairs-Rails: None Entrance Stairs-Number of Steps: 2 Bathroom Accessibility: Yes How Accessible: Accessible via walker Home Adaptive Equipment: None Prior Function Level of Independence: Independent with basic ADLs;Independent with gait Driving: Yes Vocation: Full time employment Comments: Works Psychologist, 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 Cognition Cognition Arousal/Alertness: Awake/alert Overall Cognitive Status: Appears within functional limits for tasks assessed Orientation Level: Oriented X4 Sensation/Coordination Sensation Light Touch: Appears Intact Coordination Gross Motor Movements are Fluid and Coordinated: Yes Fine Motor Movements are Fluid and Coordinated: Yes Extremity Assessment RLE Assessment RLE Assessment: Within Functional Limits LLE Assessment LLE Assessment: Within Functional Limits Mobility (including Balance) Bed Mobility Bed Mobility: Yes Rolling Left: 4: Min assist Rolling Left Details (indicate cue type and reason): min assist required during vestibular testing secondary to dizziness Left Sidelying to Sit: 4: Min assist Left  Sidelying to Sit Details (indicate cue type and reason): min assist required during vestibular testing secondary to dizziness Transfers Transfers: No (pt unable to tolerate today) Ambulation/Gait Ambulation/Gait: No  Posture/Postural Control Posture/Postural Control: No significant limitations Balance Balance Assessed: Yes Static Sitting Balance Static Sitting - Balance Support: Bilateral upper extremity supported;Feet supported Static Sitting - Level of Assistance: 4: Min assist Static Sitting - Comment/# of Minutes: Min assist secondary to vertigo in sitting. Vertigo subsides with increased time.  Exercise  Other Exercises Other Exercises: Performed Canalith Repositioning Technique for the Rt. posterior canal x 2 reps.  End of Session PT - End of Session Activity Tolerance: Other (comment) (limited by nausea and vertigo) Patient left: in bed;with call bell in reach;with family/visitor present Nurse Communication: Mobility status for transfers General Behavior During Session: [facility] for tasks performed Cognition: [facility] for tasks performed  Lahoma Rocker 03/15/2012, 11:53 AM  Orlean Bradford) Oswaldo Conroy PT, DPT Acute Rehabilitation [phone]

## 2012-03-16 MED ORDER — UNABLE TO FIND: Status: DC

## 2012-03-16 MED ORDER — MECLIZINE HCL 25 MG PO TABS: 25.0000 mg | ORAL_TABLET | Freq: Three times a day (TID) | ORAL | Status: AC | PRN

## 2012-03-16 NOTE — Progress Notes (Signed)
Physical Therapy Treatment Patient Details Name: [name] MRN: [id number] DOB: August 16, 1976 Today's Date: 03/16/2012  PT Assessment/Plan  PT - Assessment/Plan Comments on Treatment Session: 36 year old admitted for sudden onset vertigo with position change. Pt with no vertigo noted today, able to ambulate to bathroom and in hallway with min-guard assist. Pt tested with Rt. [employer] testing, negative findings. Appears pt had Rt. Posterior cupulolithiasis which is now cleared. Pt appears to be left with some vestibular hypofunction which is expected given duration of BPPV (since Thursday). Pt does best with cues for visual targeting during ambulation secondary to decreased gaze stabilization. Pt given gaze stabilization exercises(x1 viewing exercises), will need to be progressed at outpatient neurorehabilitation (vestibular rehab).  Pt will have level of care needed at home, pt and PT feel comfortable with pt D/C home.  PT Plan: Discharge plan remains appropriate PT Frequency: Min 5X/week Follow Up Recommendations: Outpatient PT ([facility] Outpatient Neurorehabilitation(vestibular)) Equipment Recommended: Tub/shower seat PT Goals  Acute Rehab PT Goals Pt will Roll Supine to Right Side: with modified independence;Other (comment) (with no c/o dizziness) PT Goal: Rolling Supine to Right Side - Progress: Met Pt will go Supine/Side to Sit: Independently;Other (comment) (with no c/o dizziness) PT Goal: Supine/Side to Sit - Progress: Progressing toward goal Pt will Sit at [facility] of Bed: Independently;with no upper extremity support;Other (comment) (with no c/o dizziness) PT Goal: Sit at [facility] Of Bed - Progress: Progressing toward goal Pt will go Sit to Stand: with modified independence;Other (comment) (with no c/o dizziness) PT Goal: Sit to Stand - Progress: Progressing toward goal Pt will go Stand to Sit: with modified independence;Other (comment) (with no c/o dizziness) PT Goal: Stand to Sit -  Progress: Progressing toward goal Pt will Ambulate: 51 - 150 feet;with least restrictive assistive device;with supervision PT Goal: Ambulate - Progress: Progressing toward goal  PT Treatment Precautions/Restrictions  Precautions Precautions: Fall Precaution Comments: Imbalance Required Braces or Orthoses: No Restrictions Weight Bearing Restrictions: No Mobility (including Balance) Bed Mobility Bed Mobility: Yes Supine to Sit: 6: Modified independent (Device/Increase time) Transfers Transfers: Yes (pt unable to tolerate today) Sit to Stand: Other (comment) (min-guard assist) Sit to Stand Details (indicate cue type and reason): Close min-guard assist secondary to imbalance in standing - no vertigo noted. Stand to Sit: Other (comment) (Close min-guard assist) Stand to Sit Details: Close min-guard assist secondary to imbalance in standing - no vertigo noted. Cues for slow control of descent Ambulation/Gait Ambulation/Gait: Yes Ambulation/Gait Assistance: 4: Min assist;Other (comment) (min-guard assist) Ambulation/Gait Assistance Details (indicate cue type and reason): up to min assist during busy visual stimulus however overal pt min-guard assist. Verbal cues for use of visual targets for gaze stability, pt reporting improvements.  Ambulation Distance (Feet): 150 Feet Assistive device: None Gait Pattern: Shuffle;Step-through pattern;Decreased stride length Gait velocity: Slow cautious gait  Posture/Postural Control Posture/Postural Control: No significant limitations Balance Balance Assessed: Yes Static Sitting Balance Static Sitting - Balance Support: Feet supported;No upper extremity supported Static Sitting - Level of Assistance: 6: Modified independent (Device/Increase time) Static Standing Balance Static Standing - Balance Support: No upper extremity supported;During functional activity Static Standing - Level of Assistance: 5: Stand by assistance Static Standing - Comment/#  of Minutes: No overt losses of balance in standing, slightly increased sway noted. Exercise  Other Exercises Other Exercises: Performed Canalith Repositioning Technique for the Rt. posterior canal x 1 rep s/p Rt. hallpike dix testing (performed as precautionary while in testing position). End of Session PT - End  of Session Equipment Utilized During Treatment: Gait belt Activity Tolerance: Patient tolerated treatment well Patient left: with call bell in reach;in chair Nurse Communication: Mobility status for transfers General Behavior During Session: [facility] for tasks performed Cognition: [facility] for tasks performed  Lahoma Rocker 03/16/2012, 8:57 AM  Orlean Bradford) Oswaldo Conroy PT, DPT Acute Rehabilitation [phone]

## 2012-03-16 NOTE — Discharge Summary (Signed)
Discharge Summary  [name] MR#: [id number]  DOB:18-Oct-1976  Date of Admission: 03/13/2012 Date of Discharge: 03/16/2012  Patient's PCP: Eliezer Lofts, MD, MD  Attending Physician:Olivia Royse A  Consults: None  Discharge Diagnoses: Principal Problem:  *Dizziness Active Problems:  Dysautomnia  Vertigo  BPPV (benign paroxysmal positional vertigo)  Brief Admitting History and Physical 36 year old Caucasian female with history of Crohn's disease, asthma, dysautonomia, who presented with dizziness on March 22 of 2013.   Discharge Medications Medication List  As of 03/16/2012  9:48 AM   TAKE these medications         ALPRAZolam 0.25 MG tablet   Commonly known as: XANAX   1-2 tabs prior to stressful events      butalbital-acetaminophen-caffeine 50-325-40 MG per tablet   Commonly known as: FIORICET, ESGIC   Take 1 tablet by mouth 2 (two) times daily as needed. For migraines      cetirizine 10 MG tablet   Commonly known as: ZYRTEC   Take 1 tablet (10 mg total) by mouth daily.      cyanocobalamin 1000 MCG/ML injection   Commonly known as: (VITAMIN B-12)   Inject 1,000 mcg into the muscle once a week.      meclizine 25 MG tablet   Commonly known as: ANTIVERT   Take 1 tablet (25 mg total) by mouth 3 (three) times daily as needed for dizziness or nausea.      medroxyPROGESTERone 150 MG/ML injection   Commonly known as: DEPO-PROVERA   Inject 150 mg into the muscle every 3 (three) months.      mercaptopurine 50 MG tablet   Commonly known as: PURINETHOL   Take 50 mg by mouth daily. Give on an empty stomach 1 hour before or 2 hours after meals. Caution: Chemotherapy.      omeprazole 20 MG capsule   Commonly known as: PRILOSEC      ondansetron 4 MG tablet   Commonly known as: ZOFRAN   Take 1 tablet (4 mg total) by mouth every 6 (six) hours.      Prenatal Vitamins (DIS) Tabs   Take 1 tablet by mouth daily.      sertraline 50 MG tablet   Commonly known as: ZOLOFT   TAKE 1 TABLET (50 MG TOTAL) BY MOUTH DAILY.      UNABLE TO FIND   Outpatient physical therapy for Benign paroxysmal positional vertigo (BPPV)      UNABLE TO FIND   Please excuse Ms. Livingston from any work obligations from 03/14/2012 till 03/23/2012 as patient was hospitalized. Patient to return to work on 03/24/2012 without any restrictions.      zolpidem 10 MG tablet   Commonly known as: AMBIEN   Take 10 mg by mouth at bedtime. Scheduled.            Hospital Course: Vertigo/dizziness  Likely due to benign paroxysmal positional vertigo.  Infectious workup negative, UA negative. MRI of the brain on 03/13/2012 showed stable and normal appearance of the brain.  Patient was evaluated by physical therapy on 03/15/2012 for vestibular symptoms.  Patient's symptoms improved after undergoing exercises for vestibular symptoms, will arrange for outpatient PT for continued exercises.  Also arrange for tub/shower bench prior to discharge.  History of syncope  Neurally mediated syncope. Management by Dr. Lovena Le, cardiology as outpatient.   Dysautomnia  Management by Dr. Lovena Le.   History of Crohn's disease  Stable   Day of Discharge BP 115/69  Pulse 99  Temp(Src) 98 F (36.7  C) (Oral)  Resp 19  Ht 5' 4"  (1.626 m)  Wt 72.75 kg (160 lb 6.2 oz)  BMI 27.53 kg/m2  SpO2 99%  No results found for this or any previous visit (from the past 48 hour(s)).  Mr Jeri Cos Wo Contrast  03/14/2012  *RADIOLOGY REPORT*  Clinical Data: 36 year old female with dizziness upon waking. Fall, nausea, vomiting.  MRI HEAD WITHOUT AND WITH CONTRAST  Technique:  Multiplanar, multiecho pulse sequences of the brain and surrounding structures were obtained according to standard protocol without and with intravenous contrast  Contrast: 37m MULTIHANCE GADOBENATE DIMEGLUMINE 529 MG/ML IV SOLN  Comparison: 08/28/2011 and earlier.  Findings: Normal cerebral volume. No restricted diffusion to suggest acute infarction.  No midline  shift, mass effect, evidence of mass lesion, ventriculomegaly, extra-axial collection or acute intracranial hemorrhage.  Cervicomedullary junction and pituitary are within normal limits.  Major intracranial vascular flow voids are preserved.  Stable and normal gray and white matter signal throughout the brain.  Mild motion artifact on postcontrast images, No abnormal enhancement identified.  Grossly normal visualized internal auditory structures.  Visualized orbit soft tissues are within normal limits.  Visualized paranasal sinuses and mastoids are clear.  Negative visualized cervical spine. Visualized bone marrow signal is within normal limits.  Negative scalp soft tissues.  IMPRESSION: Stable and normal MRI appearance of the brain.  Original Report Authenticated By: HRandall An M.D.   Disposition: Home with outpatient physical therapy.  Diet: Heart healthy diet.  Activity: Resume as tolerated   Follow-up Appts: Discharge Orders    Future Appointments: Provider: Department: Dept Phone: Center:   05/21/2012 4:30 PM [id number]     Future Orders Please Complete By Expires   Diet - low sodium heart healthy      Increase activity slowly      Discharge instructions      Comments:   Followup with AEliezer Lofts MD (PCP) in 1 week.      TESTS THAT NEED FOLLOW-UP None  Time spent on discharge, talking to the patient, and coordinating care: 25 mins.   Signed: RBynum Bellows MD 03/16/2012, 9:48 AM

## 2012-03-16 NOTE — Progress Notes (Signed)
   CARE MANAGEMENT NOTE 03/16/2012  Patient:  JENNICA, TAGLIAFERRI   Account Number:  [id number]  Date Initiated:  03/16/2012  Documentation initiated by:  [facility]  Subjective/Objective Assessment:   Dysautomnia  Vertigo  BPPV (benign paroxysmal positional vertigo)     Action/Plan:   lives at home with husband   Anticipated DC Date:  03/16/2012   Anticipated DC Plan:  [place]  CM consult      Choice offered to / List presented to:     DME arranged  [facility]      DME agency  [place].        Status of service:  Completed, signed off Medicare Important Message given?   (If response is "NO", the following Medicare IM given date fields will be blank) Date Medicare IM given:   Date Additional Medicare IM given:    Discharge Disposition:  HOME/SELF CARE  Per UR Regulation:    If discussed at Long Length of Stay Meetings, dates discussed:    Comments:  03/16/2012 1000 Provided pt with contact information for [employer]. Explained she will need to call to schedule appt and take her prescription to her first appt. Pt will possibly locate rehab that is closer to her area. Contacted [facility] for DME. Jonnie Finner RN CCM Case Mgmt phone [phone]

## 2012-03-16 NOTE — Progress Notes (Signed)
Subjective: Patient reports her dizziness is improved.  Was able to ambulate to the bathroom.  Objective: Vital signs in last 24 hours: Filed Vitals:   03/15/12 0610 03/15/12 1342 03/15/12 2225 03/16/12 0612  BP: 124/85 115/74 117/70 115/69  Pulse: 91 102 109 99  Temp: 98.4 F (36.9 C) 98.7 F (37.1 C) 99.1 F (37.3 C) 98 F (36.7 C)  TempSrc: Oral Oral Oral   Resp: 16 22 19 19   Height:      Weight:      SpO2: 100% 100% 100% 99%   Weight change:   Intake/Output Summary (Last 24 hours) at 03/16/12 0908 Last data filed at 03/15/12 1300  Gross per 24 hour  Intake    240 ml  Output      0 ml  Net    240 ml    Physical Exam: General: Awake, Oriented, No acute distress. HEENT: EOMI, nystagmus noted with changing of patient's position. Neck: Supple CV: S1 and S2 Lungs: Clear to ascultation bilaterally Abdomen: Soft, Nontender, Nondistended, +bowel sounds. Ext: Good pulses. Trace edema.  Lab Results: No results found for this basename: NA:2,K:2,CL:2,CO2:2,GLUCOSE:2,BUN:2,CREATININE:2,CALCIUM:2,MG:2,PHOS:2 in the last 72 hours No results found for this basename: AST:2,ALT:2,ALKPHOS:2,BILITOT:2,PROT:2,ALBUMIN:2 in the last 72 hours No results found for this basename: LIPASE:2,AMYLASE:2 in the last 72 hours No results found for this basename: WBC:2,NEUTROABS:2,HGB:2,HCT:2,MCV:2,PLT:2 in the last 72 hours No results found for this basename: CKTOTAL:3,CKMB:3,CKMBINDEX:3,TROPONINI:3 in the last 72 hours No components found with this basename: POCBNP:3 No results found for this basename: DDIMER:2 in the last 72 hours No results found for this basename: HGBA1C:2 in the last 72 hours No results found for this basename: CHOL:2,HDL:2,LDLCALC:2,TRIG:2,CHOLHDL:2,LDLDIRECT:2 in the last 72 hours No results found for this basename: TSH,T4TOTAL,FREET3,T3FREE,THYROIDAB in the last 72 hours No results found for this basename: VITAMINB12:2,FOLATE:2,FERRITIN:2,TIBC:2,IRON:2,RETICCTPCT:2 in the  last 72 hours  Micro Results: No results found for this or any previous visit (from the past 240 hour(s)).  Studies/Results: No results found.  Medications: I have reviewed the patient's current medications. Scheduled Meds:    . loratadine  10 mg Oral Daily  . mercaptopurine  50 mg Oral Daily  . pantoprazole  40 mg Oral Q1200  . prenatal multivitamin  1 tablet Oral Daily  . sertraline  50 mg Oral Daily  . zolpidem  10 mg Oral QHS   Continuous Infusions:    . DISCONTD: dextrose 5 % and 0.9% NaCl 20 mL/hr (03/14/12 1400)   PRN Meds:.LORazepam, meclizine, ondansetron (ZOFRAN) IV, ondansetron  Assessment/Plan: Vertigo/dizziness Likely due to benign paroxysmal positional vertigo.  UA negative.  MRI of the brain on 03/13/2012 showed stable and normal appearance of the brain.  Continue physical therapy for vestibular symptoms.  Arrange for outpatient PT.  History of syncope Neurally mediated syncope.  Management by Dr. Lovena Le, cardiology as outpatient.  Dysautomnia Management by Dr. Lovena Le.  History of Crohn's disease Stable  Prophylaxis SCDs  Disposition Discharge the patient today with outpatient PT evaluation.   LOS: 3 days  Kaisy Severino A, MD 03/16/2012, 9:08 AM

## 2012-03-17 ENCOUNTER — Telehealth: Payer: Self-pay | Admitting: Family Medicine

## 2012-03-17 DIAGNOSIS — R42 Dizziness and giddiness: Secondary | ICD-10-CM

## 2012-03-17 NOTE — Telephone Encounter (Signed)
[facility] Therapy asked for a order for P/T--pt was seen in the ER for Vertigo, and has been told she need P/T. They received an order from the ER physician, however, they need an order from the PCP for follow up. Call back # [phone]

## 2012-03-17 NOTE — ED Provider Notes (Signed)
Medical screening examination/treatment/procedure(s) were performed by non-physician practitioner and as supervising physician I was immediately available for consultation/collaboration.   Blanchie Dessert, MD 03/17/12 1332

## 2012-03-18 ENCOUNTER — Ambulatory Visit: Payer: BC Managed Care – PPO | Attending: Family Medicine | Admitting: Physical Therapy

## 2012-03-18 DIAGNOSIS — R42 Dizziness and giddiness: Secondary | ICD-10-CM | POA: Insufficient documentation

## 2012-03-18 DIAGNOSIS — IMO0001 Reserved for inherently not codable concepts without codable children: Secondary | ICD-10-CM | POA: Insufficient documentation

## 2012-03-19 ENCOUNTER — Encounter: Payer: Self-pay | Admitting: Family Medicine

## 2012-03-19 ENCOUNTER — Ambulatory Visit (INDEPENDENT_AMBULATORY_CARE_PROVIDER_SITE_OTHER): Payer: BC Managed Care – PPO | Admitting: Family Medicine

## 2012-03-19 VITALS — BP 100/60 | HR 92 | Temp 98.9°F | Ht 64.5 in | Wt 154.0 lb

## 2012-03-19 DIAGNOSIS — R42 Dizziness and giddiness: Secondary | ICD-10-CM

## 2012-03-19 DIAGNOSIS — H8110 Benign paroxysmal vertigo, unspecified ear: Secondary | ICD-10-CM

## 2012-03-19 MED ORDER — ALPRAZOLAM 0.25 MG PO TABS: ORAL_TABLET | ORAL | Status: DC

## 2012-03-19 NOTE — Progress Notes (Signed)
Patient Name: [name] Date of Birth: Oct 09, 1976 Age: 36 y.o. Medical Record Number: [id number] Gender: female Date of Encounter: 03/19/2012  History of Present Illness:  [name] is a 36 y.o. very pleasant female patient who presents with the following:  The patient was admitted to the hospital on March 13, 2012, and ultimately was discharged on March 16, 2012. She had an acute onset of severe vertigo at 5 in the morning on the day of admission, went to the emergency room. Ultimately, she was felt to have BPV L. Severe. She improved dramatically with some vestibular maneuvers. She has been given some meclizine, but has not taken it due to his drowsiness. She was given Ativan while in the hospital, and was sedated much of the time. Now she feels quite a bit better, and she is going to vestibular rehabilitation twice a week.  BPPV Woke up at 5:15 AM and acutely vertigo.  Verner Mould, then going Thurs.  Basic Metabolic Panel:    Component Value Date/Time   NA 143 03/13/2012 0841   K 4.2 03/13/2012 0841   CL 109 03/13/2012 0841   CO2 21 07/18/2011 1705   BUN 9 03/13/2012 0841   CREATININE 0.80 03/13/2012 0841   GLUCOSE 106* 03/13/2012 0841   CALCIUM 9.1 07/18/2011 1705    Past Medical History, Surgical History, Social History, Family History, Problem List, Medications, and Allergies have been reviewed and updated if relevant.  Review of Systems:  GEN: No fevers, chills. Nontoxic. Primarily MSK c/o today. MSK: Detailed in the HPI GI: tolerating PO intake without difficulty Neuro: No numbness, parasthesias, or tingling associated. Otherwise the pertinent positives of the ROS are noted above.    Physical Examination: Filed Vitals:   03/19/12 1242  BP: 100/60  Pulse: 92  Temp: 98.9 F (37.2 C)  TempSrc: Oral  Height: 5' 4.5" (1.638 m)  Weight: 154 lb (69.854 kg)  SpO2: 100%    Body mass index is 26.03 kg/(m^2).  Neurologic Exam   GEN: WDWN, NAD, Non-toxic, A & O x  3 HEENT: Atraumatic, Normocephalic. Neck supple. No masses, No LAD. Ears and Nose: No external deformity. Mild inducible vertigo with head rotation and standing and sitting. CV: RRR, No M/G/R. No JVD. No thrill. No extra heart sounds. PULM: CTA B, no wheezes, crackles, rhonchi. No retractions. No resp. distress. No accessory muscle use. ABD: S, NT, ND, +BS. No rebound tenderness. No HSM.  EXTR: No c/c/e  Neuro: CN 2-12 grossly intact. PERRLA. EOMI. Sensation intact throughout. Str 5/5 all extremities. DTR 2+. No clonus. A and o x 4. Romberg neg. Finger nose neg. Heel -shin neg.   PSYCH: Normally interactive. Conversant. Not depressed or anxious appearing.  Calm demeanor.    Assessment and Plan:  1. BPPV (benign paroxysmal positional vertigo)   2. Vertigo    Improving BPV. Continue with vestibular rehabilitation. For now, I am going to have her on to schedule Xanax twice a day over the next week to help with her vestibular symptoms.  Continue out of work for this week, return to work on Monday to full duty. I know she and her husband well. If she is still feeling unsteady at all, he can drive her to work next week without difficulty.  Orders Today: No orders of the defined types were placed in this encounter.    Medications Today: Meds ordered this encounter  Medications  . ALPRAZolam (XANAX) 0.25 MG tablet    Sig: 1-2 tabs prior to  stressful events    Dispense:  40 tablet    Refill:  0

## 2012-03-20 ENCOUNTER — Ambulatory Visit: Payer: BC Managed Care – PPO | Admitting: Physical Therapy

## 2012-03-25 ENCOUNTER — Ambulatory Visit (INDEPENDENT_AMBULATORY_CARE_PROVIDER_SITE_OTHER): Payer: BC Managed Care – PPO | Admitting: Family Medicine

## 2012-03-25 ENCOUNTER — Ambulatory Visit: Payer: BC Managed Care – PPO | Attending: Family Medicine | Admitting: Physical Therapy

## 2012-03-25 ENCOUNTER — Encounter: Payer: Self-pay | Admitting: Family Medicine

## 2012-03-25 VITALS — BP 116/80 | HR 84 | Temp 98.8°F | Wt 156.8 lb

## 2012-03-25 DIAGNOSIS — R42 Dizziness and giddiness: Secondary | ICD-10-CM | POA: Insufficient documentation

## 2012-03-25 DIAGNOSIS — IMO0001 Reserved for inherently not codable concepts without codable children: Secondary | ICD-10-CM | POA: Insufficient documentation

## 2012-03-25 DIAGNOSIS — H9201 Otalgia, right ear: Secondary | ICD-10-CM | POA: Insufficient documentation

## 2012-03-25 DIAGNOSIS — H9209 Otalgia, unspecified ear: Secondary | ICD-10-CM

## 2012-03-25 MED ORDER — AMOXICILLIN 875 MG PO TABS: 875.0000 mg | ORAL_TABLET | Freq: Two times a day (BID) | ORAL | Status: AC

## 2012-03-25 NOTE — Assessment & Plan Note (Addendum)
Recent vertigo from presumed BPPV with normal brain MRI, now with R ear pain. Mild erythema and retracted TM on right, will treat as middle ear infection with course of amoxicillin. If not improving as expected, may need further evaluation Pt states has tolerated amox in past well.

## 2012-03-25 NOTE — Patient Instructions (Signed)
I will treat you as middle ear infection. Take amoxicillin twice daily for 10 days. Use tylenol for discomfort as needed. If not improving as expected, please return to see [name].

## 2012-03-25 NOTE — Progress Notes (Signed)
  Subjective:    Patient ID: [name], female    DOB: 06-10-1976, 36 y.o.   MRN: [id number]  HPI CC: R ear pain  36 yo pt new to me, established of practice with h/o crohn's, asthma and dysautonomia presents with c/o R ear pain.  Recent complicated course - hospitalized 3/22-24/2013 with dx L severe BPPV, improved after treatment with repositioning maneuvers and underwent vestibular rehab.  Also used meclizine.  Overall improved from BPPV, continues rehab.  Returned to work on Monday.  Seen Dr. Loletha Grayer last week, started on scheduled xanax.  On Sunday started having R ear pain.  Tried heating pad, did not help.  No ringing in ears, drainage from ears, recent swimming, hearing changes or muffled hearing.  No fevers/chills, no coryza or congestion.  Takes zyrtec for allergic rhinitis. One week prior to vertigo had sinus infection.  Hospital D/C summary reviewed - normal brain MRI w/w/o contrast noted.  Medications and allergies reviewed and updated in chart. Past Medical History  Diagnosis Date  . Crohn disease 2000  . Asthma   . Endometriosis   . Cat allergies   . Dysautonomia     recurrent syncoe s/p ILR by Dr Doreatha Lew     Review of Systems Per HPI    Objective:   Physical Exam  Nursing note and vitals reviewed. Constitutional: She appears well-developed and well-nourished. No distress.  HENT:  Head: Normocephalic and atraumatic.  Right Ear: Hearing, external ear and ear canal normal.  Left Ear: Hearing, tympanic membrane, external ear and ear canal normal.  Nose: Nose normal. No mucosal edema or rhinorrhea. Right sinus exhibits no maxillary sinus tenderness and no frontal sinus tenderness. Left sinus exhibits no maxillary sinus tenderness and no frontal sinus tenderness.  Mouth/Throat: Uvula is midline, oropharynx is clear and moist and mucous membranes are normal. No oropharyngeal exudate, posterior oropharyngeal edema, posterior oropharyngeal erythema or tonsillar abscesses.         R TM - slight erythema and retracted TM, some cloudy fluid behind TM.  Canals clear. Tender to palpation R ear inferior to pinna (anterior and posterior to pinna) +PNdrainage  Eyes: Conjunctivae and EOM are normal. Pupils are equal, round, and reactive to light. No scleral icterus.  Neck: Normal range of motion. Neck supple.  Lymphadenopathy:    She has cervical adenopathy (R AC LAD, tender).  Skin: Skin is warm and dry. No rash noted.  Psychiatric: She has a normal mood and affect.       Assessment & Plan:

## 2012-03-27 ENCOUNTER — Telehealth: Payer: Self-pay | Admitting: *Deleted

## 2012-03-27 ENCOUNTER — Ambulatory Visit: Payer: BC Managed Care – PPO | Admitting: Physical Therapy

## 2012-03-27 NOTE — Telephone Encounter (Signed)
My partners note from a couple of days ago looks like she is doing better, should be ok to drive if feeling better

## 2012-03-27 NOTE — Telephone Encounter (Signed)
Patient wants to know if Dr. Lorelei Pont will allow her to drive some this weekend.  She stated that she wants to be able to drive herself to and from her appts.  Please advise.

## 2012-03-28 NOTE — Telephone Encounter (Signed)
Patient advised.

## 2012-04-01 ENCOUNTER — Ambulatory Visit: Payer: BC Managed Care – PPO | Admitting: Physical Therapy

## 2012-04-02 ENCOUNTER — Encounter: Payer: Self-pay | Admitting: *Deleted

## 2012-04-03 ENCOUNTER — Ambulatory Visit: Payer: BC Managed Care – PPO | Admitting: Physical Therapy

## 2012-04-03 ENCOUNTER — Other Ambulatory Visit: Payer: Self-pay | Admitting: *Deleted

## 2012-04-03 MED ORDER — ZOLPIDEM TARTRATE 10 MG PO TABS: 10.0000 mg | ORAL_TABLET | Freq: Every day | ORAL | Status: DC

## 2012-04-04 NOTE — Telephone Encounter (Signed)
rx called [facility]

## 2012-04-08 ENCOUNTER — Ambulatory Visit: Payer: BC Managed Care – PPO | Admitting: Physical Therapy

## 2012-04-10 ENCOUNTER — Ambulatory Visit: Payer: BC Managed Care – PPO | Admitting: Physical Therapy

## 2012-04-14 ENCOUNTER — Ambulatory Visit: Payer: BC Managed Care – PPO | Admitting: Physical Therapy

## 2012-04-16 ENCOUNTER — Ambulatory Visit: Payer: BC Managed Care – PPO | Admitting: Physical Therapy

## 2012-04-18 ENCOUNTER — Ambulatory Visit (INDEPENDENT_AMBULATORY_CARE_PROVIDER_SITE_OTHER): Payer: BC Managed Care – PPO | Admitting: Internal Medicine

## 2012-04-18 ENCOUNTER — Encounter: Payer: Self-pay | Admitting: Internal Medicine

## 2012-04-18 VITALS — BP 118/80 | HR 64 | Temp 98.7°F | Wt 156.5 lb

## 2012-04-18 DIAGNOSIS — N3000 Acute cystitis without hematuria: Secondary | ICD-10-CM

## 2012-04-18 DIAGNOSIS — R350 Frequency of micturition: Secondary | ICD-10-CM

## 2012-04-18 LAB — POCT URINALYSIS DIPSTICK
Bilirubin, UA: NEGATIVE
Ketones, UA: NEGATIVE
Nitrite, UA: NEGATIVE
Spec Grav, UA: >=1.03
pH, UA: 6

## 2012-04-18 MED ORDER — CIPROFLOXACIN HCL 250 MG PO TABS: 250.0000 mg | ORAL_TABLET | Freq: Two times a day (BID) | ORAL | Status: DC

## 2012-04-18 NOTE — Progress Notes (Signed)
Subjective:    Patient ID: [name], female    DOB: May 03, 1976, 36 y.o.   MRN: [id number]  HPI Noted increased urinary frequency a couple of days ago Increased fluids but worsened Having urgency also  Stomach upset and pain started yesterday and now is now back Has burning sensation Now with burning dysuria  No fever No sweats, shakes or chills Current Outpatient Prescriptions on File Prior to Visit  Medication Sig Dispense Refill  . ALPRAZolam (XANAX) 0.25 MG tablet 1-2 tabs prior to stressful events  40 tablet  0  . butalbital-acetaminophen-caffeine (FIORICET, ESGIC) 50-325-40 MG per tablet Take 1 tablet by mouth 2 (two) times daily as needed. For migraines      . cetirizine (ZYRTEC) 10 MG tablet Take 1 tablet (10 mg total) by mouth daily.  30 tablet  11  . cyanocobalamin (,VITAMIN B-12,) 1000 MCG/ML injection Inject 1,000 mcg into the muscle once a week.       . medroxyPROGESTERone (DEPO-PROVERA) 150 MG/ML injection Inject 150 mg into the muscle every 3 (three) months.        . mercaptopurine (PURINETHOL) 50 MG tablet Take 50 mg by mouth daily. Give on an empty stomach 1 hour before or 2 hours after meals. Caution: Chemotherapy.       Marland Kitchen omeprazole (PRILOSEC) 20 MG capsule       . Prenatal Vitamins (DIS) TABS Take 1 tablet by mouth daily.        . sertraline (ZOLOFT) 50 MG tablet TAKE 1 TABLET (50 MG TOTAL) BY MOUTH DAILY.  30 tablet  3  . zolpidem (AMBIEN) 10 MG tablet Take 1 tablet (10 mg total) by mouth at bedtime. Scheduled.  30 tablet  1    Allergies  Allergen Reactions  . Biaxin Other (See Comments)    Reacts with chron's disease  . Codeine Nausea And Vomiting  . Hydrocodone Nausea And Vomiting  . Promethazine Hcl Other (See Comments)    Muscular seizures  . Sulfonamide Derivatives Other (See Comments)    Internal bleeding    Past Medical History  Diagnosis Date  . Crohn disease 2000  . Asthma   . Endometriosis   . Cat allergies   . Dysautonomia    recurrent syncoe s/p ILR by Dr Doreatha Lew  . Palpitations     Past Surgical History  Procedure Date  . Ileocolectomy   . Loop recorder implant 12/10    by DR Doreatha Lew  . Knee surgery 1990    1988  . Cesarean section     2011, emergency  CS due to placental abruption  . Colon surgery 2002    partial removed due to crohns  . Appendectomy   . Tonsilectomy, adenoidectomy, bilateral myringotomy and tubes   . US echocardiography 11/11/2009    EF 55-60%    Family History  Problem Relation Age of Onset  . Hypertension Mother   . Hyperlipidemia Mother   . Arthritis Mother   . Diabetes Father   . Coronary artery disease Father   . Heart disease Father 68    MI age 36  . Hyperlipidemia Brother   . Hypertension Brother     History   Social History  . Marital Status: Married    Spouse Name: N/A    Number of Children: N/A  . Years of Education: N/A   Occupational History  .  Other    Airline pilot   Social History Main Topics  . Smoking status: Never Smoker   .  Smokeless tobacco: Never Used  . Alcohol Use: Yes     occasionally  . Drug Use: No  . Sexually Active: Yes   Other Topics Concern  . Not on file   Social History Narrative  . No narrative on file   Review of Systems No nausea or vomiting Appetite is okay     Objective:   Physical Exam  Constitutional: She appears well-developed and well-nourished. No distress.  Abdominal: Soft. She exhibits no distension. There is tenderness.       Mild suprapubic tenderness and slightly in LMQ  Musculoskeletal:       No CVA tenderness          Assessment & Plan:

## 2012-04-18 NOTE — Assessment & Plan Note (Signed)
Has clear cut bladder infection and perhaps even in urine Last culture in 2012 was not diagnostic so won't bother sending now--unless she fails Rx Will try cipro since she has tolerated this

## 2012-04-21 ENCOUNTER — Ambulatory Visit: Payer: BC Managed Care – PPO | Admitting: Physical Therapy

## 2012-04-22 ENCOUNTER — Ambulatory Visit: Payer: BC Managed Care – PPO | Admitting: Physical Therapy

## 2012-04-29 ENCOUNTER — Encounter: Payer: BC Managed Care – PPO | Admitting: Physical Therapy

## 2012-04-29 ENCOUNTER — Other Ambulatory Visit (HOSPITAL_COMMUNITY): Payer: Self-pay | Admitting: Gastroenterology

## 2012-04-29 DIAGNOSIS — R1011 Right upper quadrant pain: Secondary | ICD-10-CM

## 2012-04-30 ENCOUNTER — Encounter (HOSPITAL_COMMUNITY)
Admission: RE | Admit: 2012-04-30 | Discharge: 2012-04-30 | Disposition: A | Discharge status: Home / Self Care | Payer: PRIVATE HEALTH INSURANCE | Source: Ambulatory Visit | Attending: Gastroenterology | Admitting: Gastroenterology

## 2012-04-30 DIAGNOSIS — R1011 Right upper quadrant pain: Secondary | ICD-10-CM | POA: Insufficient documentation

## 2012-04-30 MED ORDER — TECHNETIUM TC 99M MEBROFENIN IV KIT
4.8000 | PACK | Freq: Once | INTRAVENOUS | Status: AC | PRN
  Administered 2012-04-30: 5 via INTRAVENOUS

## 2012-05-06 ENCOUNTER — Encounter: Payer: BC Managed Care – PPO | Admitting: Physical Therapy

## 2012-05-12 ENCOUNTER — Other Ambulatory Visit: Payer: Self-pay | Admitting: Gastroenterology

## 2012-05-12 DIAGNOSIS — K5090 Crohn's disease, unspecified, without complications: Secondary | ICD-10-CM

## 2012-05-12 DIAGNOSIS — R1011 Right upper quadrant pain: Secondary | ICD-10-CM

## 2012-05-13 ENCOUNTER — Encounter: Payer: BC Managed Care – PPO | Admitting: Physical Therapy

## 2012-05-14 ENCOUNTER — Encounter: Payer: BC Managed Care – PPO | Admitting: Physical Therapy

## 2012-05-15 ENCOUNTER — Ambulatory Visit
Admission: RE | Admit: 2012-05-15 | Discharge: 2012-05-15 | Disposition: A | Discharge status: Home / Self Care | Payer: PRIVATE HEALTH INSURANCE | Source: Ambulatory Visit | Attending: Gastroenterology | Admitting: Gastroenterology

## 2012-05-15 DIAGNOSIS — R1011 Right upper quadrant pain: Secondary | ICD-10-CM

## 2012-05-15 DIAGNOSIS — K5090 Crohn's disease, unspecified, without complications: Secondary | ICD-10-CM

## 2012-05-15 MED ORDER — IOHEXOL 300 MG/ML  SOLN
100.0000 mL | Freq: Once | INTRAMUSCULAR | Status: AC | PRN
  Administered 2012-05-15: 100 mL via INTRAVENOUS

## 2012-05-20 ENCOUNTER — Other Ambulatory Visit: Payer: Self-pay | Admitting: *Deleted

## 2012-05-20 MED ORDER — ALPRAZOLAM 0.25 MG PO TABS: ORAL_TABLET | ORAL | Status: DC

## 2012-06-09 ENCOUNTER — Other Ambulatory Visit: Payer: Self-pay | Admitting: Family Medicine

## 2012-06-09 NOTE — Telephone Encounter (Signed)
Received refill request electronically from pharmacy. Is it okay to refill medication?

## 2012-06-12 ENCOUNTER — Ambulatory Visit (INDEPENDENT_AMBULATORY_CARE_PROVIDER_SITE_OTHER): Payer: PRIVATE HEALTH INSURANCE | Admitting: *Deleted

## 2012-06-12 DIAGNOSIS — R55 Syncope and collapse: Secondary | ICD-10-CM

## 2012-06-12 NOTE — Progress Notes (Signed)
Loop recorder check in clinic  

## 2012-06-13 ENCOUNTER — Encounter: Payer: Self-pay | Admitting: Internal Medicine

## 2012-06-17 ENCOUNTER — Other Ambulatory Visit: Payer: Self-pay

## 2012-06-17 MED ORDER — ZOLPIDEM TARTRATE 10 MG PO TABS: 10.0000 mg | ORAL_TABLET | Freq: Every day | ORAL | Status: DC

## 2012-06-17 NOTE — Telephone Encounter (Signed)
Ok to fill 

## 2012-06-17 NOTE — Telephone Encounter (Signed)
Medication phoned to pharmacy.  

## 2012-07-04 ENCOUNTER — Telehealth: Payer: Self-pay | Admitting: Family Medicine

## 2012-07-04 NOTE — Telephone Encounter (Signed)
Noted  

## 2012-07-04 NOTE — Telephone Encounter (Signed)
Left message for patient to return my call.

## 2012-07-04 NOTE — Telephone Encounter (Signed)
Caller: [name]/Patient; PCP: Eliezer Lofts E.; CB#: [phone]; ; ; Call regarding Urinary Pain/Bleeding; Pt reports she woke 07/04/2012 and had pain in her bladder and pain when she urinates.  Pt states she gets UTI's frequently and would like to know if MD will just call her in RX.  All emergent s/sx of Urinary Symptoms - Female ruled out.  Home care advice given.  Pt would like RX called to [facility] in [place] # [phone].  Advised pt it is preferred for her to come in and have Urine culture.  PT insists she is at work and is unable to drive "all the way to the office and get back to work."  Advised UC and pt stated "My insurance does not cover that."  Pt cont repeats "this is my classic UTI sx I get them all the time they should be able to look back in my chart and call in whatever I have used before."  Advised will send note to office.  Pt reports "I'm miserable with these s/sx."   ** Please call her back asap**

## 2012-07-04 NOTE — Telephone Encounter (Signed)
She needs to at least bring a urine sample.. Especially because she has frequent UTIs.. Had UTI in 03/2012, but in 8/12 urine culture was clear. We do not want to overuse antibiotics in her unless documented UTI to avoid developing a bug that is resistant to our common  antibotics... THIS WOULD BE BAD FOR HER.

## 2012-07-04 NOTE — Telephone Encounter (Signed)
Patient never returned my call

## 2012-07-05 ENCOUNTER — Ambulatory Visit (INDEPENDENT_AMBULATORY_CARE_PROVIDER_SITE_OTHER): Payer: PRIVATE HEALTH INSURANCE | Admitting: Family Medicine

## 2012-07-05 ENCOUNTER — Encounter: Payer: Self-pay | Admitting: Family Medicine

## 2012-07-05 VITALS — BP 108/76 | HR 120 | Temp 97.6°F | Resp 20 | Wt 157.8 lb

## 2012-07-05 DIAGNOSIS — R300 Dysuria: Secondary | ICD-10-CM

## 2012-07-05 LAB — POCT URINALYSIS DIPSTICK
Bilirubin, UA: NEGATIVE
Glucose, UA: NEGATIVE
Ketones, UA: NEGATIVE
Leukocytes, UA: NEGATIVE
Spec Grav, UA: >=1.03

## 2012-07-05 MED ORDER — CIPROFLOXACIN HCL 250 MG PO TABS: 250.0000 mg | ORAL_TABLET | Freq: Two times a day (BID) | ORAL | Status: AC

## 2012-07-05 MED ORDER — CIPROFLOXACIN HCL 250 MG PO TABS: 250.0000 mg | ORAL_TABLET | Freq: Two times a day (BID) | ORAL | Status: DC

## 2012-07-05 NOTE — Patient Instructions (Addendum)
Start Cipro for urinary tract infection as directed. Go ahead- start AZO. Continue pushing fluids. Have a good weekend.

## 2012-07-05 NOTE — Progress Notes (Signed)
Subjective:    Patient ID: [name], female    DOB: 02/19/1976, 36 y.o.   MRN: [id number]  HPI 36 yo presents to weekend clinic with dysuria. H/o recurrent UTIs. Last urine cx from 07/2011- insignificant growth.  Noted increased urinary frequency, dysuria and bilateral lower back pain x 1 day. Increased fluids but worsened Having urgency also   No fever No sweats, shakes or chills Current Outpatient Prescriptions on File Prior to Visit  Medication Sig Dispense Refill  . ALPRAZolam (XANAX) 0.25 MG tablet 1-2 tabs prior to stressful events  40 tablet  0  . butalbital-acetaminophen-caffeine (FIORICET, ESGIC) 50-325-40 MG per tablet Take 1 tablet by mouth 2 (two) times daily as needed. For migraines      . cetirizine (ZYRTEC) 10 MG tablet Take 1 tablet (10 mg total) by mouth daily.  30 tablet  11  . cyanocobalamin (,VITAMIN B-12,) 1000 MCG/ML injection Inject 1,000 mcg into the muscle once a week.       Marland Kitchen dexlansoprazole (DEXILANT) 60 MG capsule Take 60 mg by mouth daily.      . mercaptopurine (PURINETHOL) 50 MG tablet Take 50 mg by mouth daily. Give on an empty stomach 1 hour before or 2 hours after meals. Caution: Chemotherapy.       . Prenatal Vitamins (DIS) TABS Take 1 tablet by mouth daily.        . sertraline (ZOLOFT) 50 MG tablet TAKE 1 TABLET (50 MG TOTAL) BY MOUTH DAILY.  30 tablet  3  . zolpidem (AMBIEN) 10 MG tablet Take 1 tablet (10 mg total) by mouth at bedtime. Scheduled.  30 tablet  1  . DISCONTD: sertraline (ZOLOFT) 50 MG tablet Take 0.5 tablets (25 mg total) by mouth daily.  30 tablet  5    Allergies  Allergen Reactions  . Clarithromycin Other (See Comments)    Reacts with chron's disease  . Codeine Nausea And Vomiting  . Hydrocodone Nausea And Vomiting  . Promethazine Hcl Other (See Comments)    Muscular seizures  . Sulfonamide Derivatives Other (See Comments)    Internal bleeding    Past Medical History  Diagnosis Date  . Crohn disease 2000  . Asthma     . Endometriosis   . Cat allergies   . Dysautonomia     recurrent syncoe s/p ILR by Dr Doreatha Lew  . Palpitations     Past Surgical History  Procedure Date  . Ileocolectomy   . Loop recorder implant 12/10    by DR Doreatha Lew  . Knee surgery 1990    1988  . Cesarean section     2011, emergency  CS due to placental abruption  . Colon surgery 2002    partial removed due to crohns  . Appendectomy   . Tonsilectomy, adenoidectomy, bilateral myringotomy and tubes   . US echocardiography 11/11/2009    EF 55-60%    Family History  Problem Relation Age of Onset  . Hypertension Mother   . Hyperlipidemia Mother   . Arthritis Mother   . Diabetes Father   . Coronary artery disease Father   . Heart disease Father 52    MI age 63  . Hyperlipidemia Brother   . Hypertension Brother     History   Social History  . Marital Status: Married    Spouse Name: N/A    Number of Children: N/A  . Years of Education: N/A   Occupational History  .  Other    Airline pilot  Social History Main Topics  . Smoking status: Never Smoker   . Smokeless tobacco: Never Used  . Alcohol Use: Yes     occasionally  . Drug Use: No  . Sexually Active: Yes   Other Topics Concern  . Not on file   Social History Narrative  . No narrative on file   Review of Systems See HPI She has nausea, no vomiting. Appetite is okay     Objective:   Physical Exam  BP 108/76  Pulse 120  Temp 97.6 F (36.4 C) (Oral)  Resp 20  Wt 157 lb 12 oz (71.555 kg)  SpO2 97%  Constitutional: She appears well-developed and well-nourished. No distress.  Abdominal: Soft. She exhibits no distension. There is tenderness.       Mild suprapubic tenderness and slightly in LMQ  Musculoskeletal:       No CVA tenderness      Assessment & Plan:   1. Dysuria    New- with nausea. UA positive for large blood only but symptoms classic for UTI. Will treat with cipro. See pt instructions for details. The patient  indicates understanding of these issues and agrees with the plan.

## 2012-07-05 NOTE — Addendum Note (Signed)
Addended by: Estell Harpin T on: 07/05/2012 10:24 AM   Modules accepted: Orders

## 2012-07-06 ENCOUNTER — Encounter (HOSPITAL_COMMUNITY): Payer: Self-pay | Admitting: Emergency Medicine

## 2012-07-06 ENCOUNTER — Emergency Department (HOSPITAL_COMMUNITY)
Admission: EM | Admit: 2012-07-06 | Discharge: 2012-07-06 | Disposition: A | Discharge status: Home / Self Care | Payer: PRIVATE HEALTH INSURANCE | Attending: Emergency Medicine | Admitting: Emergency Medicine

## 2012-07-06 DIAGNOSIS — K5090 Crohn's disease, unspecified, without complications: Secondary | ICD-10-CM | POA: Insufficient documentation

## 2012-07-06 DIAGNOSIS — J45909 Unspecified asthma, uncomplicated: Secondary | ICD-10-CM | POA: Insufficient documentation

## 2012-07-06 DIAGNOSIS — N390 Urinary tract infection, site not specified: Secondary | ICD-10-CM | POA: Insufficient documentation

## 2012-07-06 DIAGNOSIS — R10819 Abdominal tenderness, unspecified site: Secondary | ICD-10-CM | POA: Insufficient documentation

## 2012-07-06 DIAGNOSIS — R300 Dysuria: Secondary | ICD-10-CM | POA: Insufficient documentation

## 2012-07-06 DIAGNOSIS — R350 Frequency of micturition: Secondary | ICD-10-CM | POA: Insufficient documentation

## 2012-07-06 DIAGNOSIS — Z79899 Other long term (current) drug therapy: Secondary | ICD-10-CM | POA: Insufficient documentation

## 2012-07-06 DIAGNOSIS — R112 Nausea with vomiting, unspecified: Secondary | ICD-10-CM | POA: Insufficient documentation

## 2012-07-06 DIAGNOSIS — Z9889 Other specified postprocedural states: Secondary | ICD-10-CM | POA: Insufficient documentation

## 2012-07-06 DIAGNOSIS — R109 Unspecified abdominal pain: Secondary | ICD-10-CM | POA: Insufficient documentation

## 2012-07-06 DIAGNOSIS — R3915 Urgency of urination: Secondary | ICD-10-CM | POA: Insufficient documentation

## 2012-07-06 LAB — URINALYSIS, ROUTINE W REFLEX MICROSCOPIC
Glucose, UA: NEGATIVE mg/dL
Nitrite: POSITIVE — AB
Specific Gravity, Urine: 1.023 (ref 1.005–1.030)
pH: 5 (ref 5.0–8.0)

## 2012-07-06 LAB — URINE MICROSCOPIC-ADD ON

## 2012-07-06 LAB — POCT PREGNANCY, URINE: Preg Test, Ur: NEGATIVE

## 2012-07-06 LAB — WET PREP, GENITAL

## 2012-07-06 MED ORDER — ONDANSETRON HCL 4 MG PO TABS: 4.0000 mg | ORAL_TABLET | Freq: Four times a day (QID) | ORAL | Status: AC

## 2012-07-06 MED ORDER — ONDANSETRON HCL 4 MG/2ML IJ SOLN
4.0000 mg | Freq: Once | INTRAMUSCULAR | Status: AC
  Administered 2012-07-06: 4 mg via INTRAVENOUS
  Filled 2012-07-06: qty 2

## 2012-07-06 MED ORDER — CEPHALEXIN 500 MG PO CAPS: 500.0000 mg | ORAL_CAPSULE | Freq: Four times a day (QID) | ORAL | Status: AC

## 2012-07-06 MED ORDER — DEXTROSE 5 % IV SOLN
1.0000 g | Freq: Once | INTRAVENOUS | Status: AC
  Administered 2012-07-06: 1 g via INTRAVENOUS
  Filled 2012-07-06: qty 10

## 2012-07-06 MED ORDER — MORPHINE SULFATE 4 MG/ML IJ SOLN
4.0000 mg | Freq: Once | INTRAMUSCULAR | Status: AC
  Administered 2012-07-06: 4 mg via INTRAVENOUS
  Filled 2012-07-06: qty 1

## 2012-07-06 NOTE — ED Notes (Signed)
Report given to [facility]

## 2012-07-06 NOTE — ED Notes (Signed)
Pt. Stated, I was seen by [facility] group and given Cipro and PZO and I'm hurting and burning like fire all the time

## 2012-07-06 NOTE — ED Provider Notes (Signed)
History     CSN: [id number]  Arrival date & time 07/06/12  70   First MD Initiated Contact with Patient 07/06/12 1832      Chief Complaint  Patient presents with  . Urinary Frequency    (Consider location/radiation/quality/duration/timing/severity/associated sxs/prior treatment) HPI  36 year old female presents complaining of urinary symptoms. Patient reports for the past 3 days she has had burning urination, urinary frequency, and urgency. She has evaluated by her primary care Dr. and was prescribed Cipro for urinary tract infection. She hasn't taken medication for the past 2 days but noticed no improvement. She now endorse low abdominal pain, with nausea, and vomiting secondary to her pain. Pain is described as a sharp and burning sensation, that is constant. Pain is nonradiating. She denies fever, chills, chest pain, shortness of breath, back pain, vaginal discharge, or abnormal bleeding.  Past Medical History  Diagnosis Date  . Crohn disease 2000  . Asthma   . Endometriosis   . Cat allergies   . Dysautonomia     recurrent syncoe s/p ILR by Dr Doreatha Lew  . Palpitations     Past Surgical History  Procedure Date  . Ileocolectomy   . Loop recorder implant 12/10    by DR Doreatha Lew  . Knee surgery 1990    1988  . Cesarean section     2011, emergency  CS due to placental abruption  . Colon surgery 2002    partial removed due to crohns  . Appendectomy   . Tonsilectomy, adenoidectomy, bilateral myringotomy and tubes   . US echocardiography 11/11/2009    EF 55-60%    Family History  Problem Relation Age of Onset  . Hypertension Mother   . Hyperlipidemia Mother   . Arthritis Mother   . Diabetes Father   . Coronary artery disease Father   . Heart disease Father 15    MI age 55  . Hyperlipidemia Brother   . Hypertension Brother     History  Substance Use Topics  . Smoking status: Never Smoker   . Smokeless tobacco: Never Used  . Alcohol Use: Yes     occasionally      OB History    Grav Para Term Preterm Abortions TAB SAB Ect Mult Living                  Review of Systems  All other systems reviewed and are negative.    Allergies  Clarithromycin; Codeine; Hydrocodone; Promethazine hcl; and Sulfonamide derivatives  Home Medications   Current Outpatient Rx  Name Route Sig Dispense Refill  . ALPRAZOLAM 0.25 MG PO TABS Oral Take 0.25-0.5 mg by mouth daily as needed. For anxiety    . BUTALBITAL-APAP-CAFFEINE 50-325-40 MG PO TABS Oral Take 1 tablet by mouth 2 (two) times daily as needed. For migraines    . CETIRIZINE HCL 10 MG PO TABS Oral Take 1 tablet (10 mg total) by mouth daily. 30 tablet 11  . CIPROFLOXACIN HCL 250 MG PO TABS Oral Take 1 tablet (250 mg total) by mouth 2 (two) times daily. 20 tablet 0  . CLOMIPHENE CITRATE 50 MG PO TABS Oral Take 50 mg by mouth daily.    . CYANOCOBALAMIN 1000 MCG/ML IJ SOLN Intramuscular Inject 1,000 mcg into the muscle once a week.     . DEXLANSOPRAZOLE 60 MG PO CPDR Oral Take 60 mg by mouth daily.    Marland Kitchen PRENATAL VITAMINS (DIS) PO TABS Oral Take 1 tablet by mouth daily.      Marland Kitchen  SERTRALINE HCL 50 MG PO TABS  TAKE 1 TABLET (50 MG TOTAL) BY MOUTH DAILY. 30 tablet 3  . ZOLPIDEM TARTRATE 10 MG PO TABS Oral Take 1 tablet (10 mg total) by mouth at bedtime. Scheduled. 30 tablet 1    BP 118/78  Pulse 123  Temp 98.6 F (37 C) (Oral)  Resp 18  SpO2 97%  LMP 06/22/2012  Physical Exam  Nursing note and vitals reviewed. Constitutional: She appears well-developed and well-nourished. No distress.  HENT:  Head: Normocephalic and atraumatic.  Eyes: Conjunctivae are normal.  Neck: Normal range of motion. Neck supple.  Cardiovascular: Normal rate and regular rhythm.   Pulmonary/Chest: Effort normal and breath sounds normal. She exhibits no tenderness.  Abdominal: Soft. There is tenderness in the suprapubic area. There is no rigidity, no guarding, no CVA tenderness, no tenderness at McBurney's point and negative  Murphy's sign. No hernia. Hernia confirmed negative in the ventral area.  Genitourinary: Vagina normal and uterus normal. There is no rash or lesion on the right labia. There is no rash or lesion on the left labia. Cervix exhibits no motion tenderness and no discharge. Right adnexum displays no mass and no tenderness. Left adnexum displays no mass and no tenderness. No erythema, tenderness or bleeding around the vagina. No vaginal discharge found.       Chaperone present  Lymphadenopathy:       Right: No inguinal adenopathy present.       Left: No inguinal adenopathy present.    ED Course  Procedures (including critical care time)  Labs Reviewed  URINALYSIS, ROUTINE W REFLEX MICROSCOPIC - Abnormal; Notable for the following:    Color, Urine RED (*)  BIOCHEMICALS MAY BE AFFECTED BY COLOR   APPearance CLOUDY (*)     Hgb urine dipstick TRACE (*)     Bilirubin Urine SMALL (*)     Ketones, ur 15 (*)     Urobilinogen, UA 2.0 (*)     Nitrite POSITIVE (*)     Leukocytes, UA MODERATE (*)     All other components within normal limits  URINE MICROSCOPIC-ADD ON - Abnormal; Notable for the following:    Squamous Epithelial / LPF MANY (*)     Bacteria, UA FEW (*)     All other components within normal limits  POCT PREGNANCY, URINE   No results found.   No diagnosis found.  Results for orders placed during the hospital encounter of 07/06/12  URINALYSIS, ROUTINE W REFLEX MICROSCOPIC      Component Value Range   Color, Urine RED (*) YELLOW   APPearance CLOUDY (*) CLEAR   Specific Gravity, Urine 1.023  1.005 - 1.030   pH 5.0  5.0 - 8.0   Glucose, UA NEGATIVE  NEGATIVE mg/dL   Hgb urine dipstick TRACE (*) NEGATIVE   Bilirubin Urine SMALL (*) NEGATIVE   Ketones, ur 15 (*) NEGATIVE mg/dL   Protein, ur NEGATIVE  NEGATIVE mg/dL   Urobilinogen, UA 2.0 (*) 0.0 - 1.0 mg/dL   Nitrite POSITIVE (*) NEGATIVE   Leukocytes, UA MODERATE (*) NEGATIVE  POCT PREGNANCY, URINE      Component Value Range     Preg Test, Ur NEGATIVE  NEGATIVE  URINE MICROSCOPIC-ADD ON      Component Value Range   Squamous Epithelial / LPF MANY (*) RARE   WBC, UA 3-6  <3 WBC/hpf   RBC / HPF 0-2  <3 RBC/hpf   Bacteria, UA FEW (*) RARE   Urine-Other MUCOUS PRESENT  WET PREP, GENITAL      Component Value Range   Yeast Wet Prep HPF POC NONE SEEN  NONE SEEN   Trich, Wet Prep NONE SEEN  NONE SEEN   Clue Cells Wet Prep HPF POC FEW (*) NONE SEEN   WBC, Wet Prep HPF POC FEW (*) NONE SEEN   No results found.  1. UTI  MDM  UTI, unresolved with cipro.  Suprapubic tenderness without CVA tenderness.  Pelvic exam unremarkable.  Will give rocephin, zofran and pain medication.  Will continue to monitor.     9:07 PM Pt felt better after treatment.  Still endorse mild abd pain.  VSS.  Pt will be dc with keflex and f/u with Dr. Harrington Challenger, her OBGYN.  Pt to discontinue cipro.  Urine culture sent.    BP 120/83  Pulse 89  Temp 98.6 F (37 C) (Oral)  Resp 16  SpO2 100%  LMP 06/22/2012     Domenic Moras, PA-C 07/06/12 2109

## 2012-07-06 NOTE — ED Notes (Signed)
Pt states understanding of discharge instructions 

## 2012-07-06 NOTE — ED Notes (Signed)
IV team called. 

## 2012-07-07 ENCOUNTER — Telehealth: Payer: Self-pay | Admitting: Family Medicine

## 2012-07-07 LAB — GC/CHLAMYDIA PROBE AMP, GENITAL
Chlamydia, DNA Probe: NEGATIVE
GC Probe Amp, Genital: NEGATIVE

## 2012-07-07 NOTE — Telephone Encounter (Signed)
Triage Record Num: [id number] Operator: Agustina Caroli Patient Name: [name] Call Date & Time: 07/06/2012 2:23:35PM Patient Phone: [phone] PCP: Eliezer Lofts Patient Gender: Female PCP Fax : [phone] Patient DOB: 1976/03/12 Practice Name: [facility] Reason for Call: Caller: [name]/Patient; PCP: Jinny Sanders.; CB#: [phone]; Call regarding Urinary Pain/Bleeding; [name] states she has had UTI since 07/04/12. Was seen in office on 07/05/12. Was started on Cipro x 10 days. Having constant lower abdominal pain onset 07/05/12 - rates pain 8 on 1/10 pt scale. Taking Azo- has orange urine. Per abdominal pain protocol advised to be seenin ED due to unbearable abdominal pain. Confirmed verbally with Dr. Deborra Medina. Care advice given. Protocol(s) Used: Abdominal Pain Protocol(s) Used: Urinary Symptoms - Female Recommended Outcome per Protocol: See ED Immediately Reason for Outcome: Unbearable abdominal/pelvic pain Unbearable abdominal/pelvic pain Care Advice: ~ Allow the patient to be in a position of comfort. ~ Do not eat or drink anything until evaluated by provider. Call EMS 911 if signs and symptoms of shock develop (such as unable to stand due to faintness, dizziness, or lightheadedness; new onset of confusion; slow to respond or difficult to awaken; skin is pale, gray, cool, or moist to touch; severe weakness; loss of consciousness). ~ ~ IMMEDIATE ACTION 07/06/2012 2:35:45PM Page 1 of 1 [id number]

## 2012-07-07 NOTE — ED Provider Notes (Signed)
Medical screening examination/treatment/procedure(s) were performed by non-physician practitioner and as supervising physician I was immediately available for consultation/collaboration.  Richarda Blade, MD 07/07/12 1327

## 2012-07-08 LAB — URINE CULTURE

## 2012-07-16 ENCOUNTER — Other Ambulatory Visit: Payer: Self-pay

## 2012-07-16 DIAGNOSIS — R55 Syncope and collapse: Secondary | ICD-10-CM

## 2012-07-16 MED ORDER — CETIRIZINE HCL 10 MG PO TABS: 10.0000 mg | ORAL_TABLET | Freq: Every day | ORAL | Status: DC

## 2012-07-16 NOTE — Telephone Encounter (Signed)
[name] faxed refill cetirizine. Pt last seen 07/05/12 for sick visit but no f/u or check up since established 07/27/11. No future appt scheduled. Is OK to refill?

## 2012-07-17 ENCOUNTER — Encounter: Payer: BC Managed Care – PPO | Admitting: Family Medicine

## 2012-07-21 ENCOUNTER — Other Ambulatory Visit: Payer: Self-pay | Admitting: *Deleted

## 2012-07-21 DIAGNOSIS — R55 Syncope and collapse: Secondary | ICD-10-CM

## 2012-07-21 MED ORDER — CETIRIZINE HCL 10 MG PO TABS: 10.0000 mg | ORAL_TABLET | Freq: Every day | ORAL | Status: DC

## 2012-07-21 NOTE — Telephone Encounter (Signed)
Received faxed refill request from pharmacy.  Last visit was a sick visit on  07/05/12. Is it okay to refill medication?

## 2012-07-22 MED ORDER — ALPRAZOLAM 0.25 MG PO TABS: 0.2500 mg | ORAL_TABLET | Freq: Every day | ORAL | Status: DC | PRN

## 2012-07-22 NOTE — Telephone Encounter (Signed)
Okay to refill, but pt needs appt for annual check on chronic issues or CPX if does not have GYN.

## 2012-07-22 NOTE — Telephone Encounter (Signed)
Medication called to pharmacy and patient advised to schedule appt for cpx

## 2012-07-31 ENCOUNTER — Ambulatory Visit (INDEPENDENT_AMBULATORY_CARE_PROVIDER_SITE_OTHER): Payer: PRIVATE HEALTH INSURANCE | Admitting: Family Medicine

## 2012-07-31 ENCOUNTER — Telehealth: Payer: Self-pay | Admitting: Family Medicine

## 2012-07-31 ENCOUNTER — Encounter: Payer: Self-pay | Admitting: Family Medicine

## 2012-07-31 VITALS — BP 114/84 | HR 80 | Temp 98.7°F | Wt 155.0 lb

## 2012-07-31 DIAGNOSIS — G43819 Other migraine, intractable, without status migrainosus: Secondary | ICD-10-CM

## 2012-07-31 DIAGNOSIS — G43919 Migraine, unspecified, intractable, without status migrainosus: Secondary | ICD-10-CM

## 2012-07-31 DIAGNOSIS — R51 Headache: Secondary | ICD-10-CM

## 2012-07-31 MED ORDER — DIPHENHYDRAMINE HCL 50 MG/ML IJ SOLN
50.0000 mg | Freq: Once | INTRAMUSCULAR | Status: AC
  Administered 2012-07-31: 50 mg via INTRAMUSCULAR

## 2012-07-31 MED ORDER — METHYLPREDNISOLONE ACETATE 40 MG/ML IJ SUSP
40.0000 mg | Freq: Once | INTRAMUSCULAR | Status: AC
  Administered 2012-07-31: 40 mg via INTRAMUSCULAR

## 2012-07-31 MED ORDER — KETOROLAC TROMETHAMINE 60 MG/2ML IM SOLN
60.0000 mg | Freq: Once | INTRAMUSCULAR | Status: AC
  Administered 2012-07-31: 60 mg via INTRAMUSCULAR

## 2012-07-31 MED ORDER — MORPHINE SULFATE 15 MG PO TABS: 15.0000 mg | ORAL_TABLET | ORAL | Status: DC | PRN

## 2012-07-31 NOTE — Telephone Encounter (Signed)
Patient advised and will come in at 2 for appt

## 2012-07-31 NOTE — Telephone Encounter (Signed)
Have the patient come at 2 PM today

## 2012-07-31 NOTE — Telephone Encounter (Signed)
Caller: Sammie/Patient; PCP: Eliezer Lofts E.; CB#: [phone] or [phone];  Call regarding Migranes; symptoms started on 07/27/12; has had a constant h/a since then; nothing is helping the pain; Tramadol; Tylenol; Fioricet; Chiropractor; heating pain; nothing is working; rates pain 10/10; little vomiting with this; pt reports this is severe disabling head pain; Triaged per Headache Guideline; 911 disposition due to sudden severe disabling head pain; pt refusing 911 and ER; she states that she wants Dr Diona Browner to make it go away; pt states that she will see first available MD in the office; she will even wait in the waiting room to be seen; can not afford ER visit; OFFICE PLEASE REVIEW AND CALL PATIENT BACK AT [place]- REFUSING [place]- pt instructed that there are no appt and really recommend ER but we will give her a call back shortly for possible work in appt; call back if haven't heard from office or symptoms worsen please go to ER

## 2012-07-31 NOTE — Progress Notes (Signed)
Therapist, music at [facility] [place] [address] Phone: [phone] Fax: [phone]  Date:  07/31/2012   Name:  HELON WISINSKI   DOB:  27-Mar-1976   MRN:  [id number]  PCP:  Eliezer Lofts, MD    Chief Complaint: migraine x 5 days   History of Present Illness:  [name] is a 36 y.o. very pleasant female patient who presents with the following:  Patient with a history of severe migraines in the past, crohn's disease, presents with 5 days of acute headache, nausea, photophobia, phonophobia that has not been able to abate with fiorecet, tramadol, tylenol, chiropractic manipulation, or muscle relaxers.  5 days, never has had them last this long. [facility]. Stopped all migraine proph - was on gabapentin and keppra before.   A couple of weeks ago was her last period.  Fiorecet -- has tried some muscle relaxers, tramadol, tylenol.   Patient Active Problem List  Diagnosis  . SYNCOPE, HX OF  . Dizziness  . Fatigue  . Crohn's disease  . Reactive arthritis due to crohn's flares  . Dysautomnia  . Asthma, moderate persistent  . GERD (gastroesophageal reflux disease)  . Allergic rhinitis  . Endometriosis  . Migraine  . Calcium nephrolithiasis  . Vertigo  . Pharyngitis  . BPPV (benign paroxysmal positional vertigo)    Past Medical History  Diagnosis Date  . Crohn disease 2000  . Asthma   . Endometriosis   . Cat allergies   . Dysautonomia     recurrent syncoe s/p ILR by Dr Doreatha Lew  . Palpitations     Past Surgical History  Procedure Date  . Ileocolectomy   . Loop recorder implant 12/10    by DR Doreatha Lew  . Knee surgery 1990    1988  . Cesarean section     2011, emergency  CS due to placental abruption  . Colon surgery 2002    partial removed due to crohns  . Appendectomy   . Tonsilectomy, adenoidectomy, bilateral myringotomy and tubes   . US echocardiography 11/11/2009    EF 55-60%    History    Substance Use Topics  . Smoking status: Never Smoker   . Smokeless tobacco: Never Used  . Alcohol Use: Yes     occasionally    Family History  Problem Relation Age of Onset  . Hypertension Mother   . Hyperlipidemia Mother   . Arthritis Mother   . Diabetes Father   . Coronary artery disease Father   . Heart disease Father 86    MI age 59  . Hyperlipidemia Brother   . Hypertension Brother     Allergies  Allergen Reactions  . Clarithromycin Other (See Comments)    Reacts with chron's disease  . Codeine Nausea And Vomiting  . Hydrocodone Nausea And Vomiting  . Promethazine Hcl Other (See Comments)    Muscular seizures  . Sulfonamide Derivatives Other (See Comments)    Internal bleeding    Medication list has been reviewed and updated.  Current Outpatient Prescriptions on File Prior to Visit  Medication Sig Dispense Refill  . ALPRAZolam (XANAX) 0.25 MG tablet Take 1-2 tablets (0.25-0.5 mg total) by mouth daily as needed. For anxiety  30 tablet  0  . butalbital-acetaminophen-caffeine (FIORICET, ESGIC) 50-325-40 MG per tablet Take 1 tablet by mouth 2 (two) times daily as needed. For migraines      . cetirizine (ZYRTEC) 10 MG tablet  Take 1 tablet (10 mg total) by mouth daily.  30 tablet  11  . clomiPHENE (CLOMID) 50 MG tablet Take 50 mg by mouth daily.      . cyanocobalamin (,VITAMIN B-12,) 1000 MCG/ML injection Inject 1,000 mcg into the muscle once a week.       Marland Kitchen dexlansoprazole (DEXILANT) 60 MG capsule Take 60 mg by mouth daily.      . Prenatal Vitamins (DIS) TABS Take 1 tablet by mouth daily.        Marland Kitchen zolpidem (AMBIEN) 10 MG tablet Take 1 tablet (10 mg total) by mouth at bedtime. Scheduled.  30 tablet  1    Review of Systems:  HA as described. No blurred vision, no slurred speech, balance disturbance or memory probs. Afebrile.   Physical Examination: Filed Vitals:   07/31/12 1401  BP: 114/84  Pulse: 80  Temp: 98.7 F (37.1 C)   Filed Vitals:   07/31/12 1401   Weight: 155 lb (70.308 kg)   There is no height on file to calculate BMI. Ideal Body Weight:     GEN: WDWN, NAD, mild distress, A & O x 3 HEENT: Atraumatic, Normocephalic. Neck supple. No masses, No LAD. Ears and Nose: No external deformity. CV: RRR, No M/G/R. No JVD. No thrill. No extra heart sounds. PULM: CTA B, no wheezes, crackles, rhonchi. No retractions. No resp. distress. No accessory muscle use. EXTR: No c/c/e NEURO Normal gait. PERRLA, EOMI, finger nose is normal PSYCH: withdrawn   Assessment and Plan:  1. Refractory Migraine    2. Headache  methylPREDNISolone acetate (DEPO-MEDROL) injection 40 mg, diphenhydrAMINE (BENADRYL) injection 50 mg, ketorolac (TORADOL) injection 60 mg   Cocktail of IM meds as below  Limited in other meds given intolerances, actively trying to conceive, and crohns.  Reviewed UpToDate, given options will give some MSIR to try given intolerance to codeine, vidocin, percocet. Morphine is tolerated historically.  Orders Today:  No orders of the defined types were placed in this encounter.    Medications Today: (Includes new updates added during medication reconciliation) Meds ordered this encounter  Medications  . morphine (MSIR) 15 MG tablet    Sig: Take 1 tablet (15 mg total) by mouth every 4 (four) hours as needed for pain.    Dispense:  15 tablet    Refill:  0  . methylPREDNISolone acetate (DEPO-MEDROL) injection 40 mg    Sig:   . diphenhydrAMINE (BENADRYL) injection 50 mg    Sig:   . ketorolac (TORADOL) injection 60 mg    Sig:      Owens Loffler, MD

## 2012-08-02 ENCOUNTER — Emergency Department (HOSPITAL_COMMUNITY)
Admission: EM | Admit: 2012-08-02 | Discharge: 2012-08-03 | Disposition: A | Discharge status: Home / Self Care | Payer: PRIVATE HEALTH INSURANCE | Attending: Emergency Medicine | Admitting: Emergency Medicine

## 2012-08-02 ENCOUNTER — Encounter (HOSPITAL_COMMUNITY): Payer: Self-pay | Admitting: Emergency Medicine

## 2012-08-02 DIAGNOSIS — G43909 Migraine, unspecified, not intractable, without status migrainosus: Secondary | ICD-10-CM | POA: Insufficient documentation

## 2012-08-02 DIAGNOSIS — Z9089 Acquired absence of other organs: Secondary | ICD-10-CM | POA: Insufficient documentation

## 2012-08-02 DIAGNOSIS — K5090 Crohn's disease, unspecified, without complications: Secondary | ICD-10-CM | POA: Insufficient documentation

## 2012-08-02 DIAGNOSIS — Z79899 Other long term (current) drug therapy: Secondary | ICD-10-CM | POA: Insufficient documentation

## 2012-08-02 MED ORDER — DIHYDROERGOTAMINE MESYLATE 1 MG/ML IJ SOLN
1.0000 mg | Freq: Once | INTRAMUSCULAR | Status: AC
  Administered 2012-08-02: 1 mg via INTRAVENOUS
  Filled 2012-08-02: qty 1

## 2012-08-02 MED ORDER — ONDANSETRON HCL 4 MG/2ML IJ SOLN
4.0000 mg | Freq: Once | INTRAMUSCULAR | Status: AC
  Administered 2012-08-02: 4 mg via INTRAVENOUS
  Filled 2012-08-02: qty 2

## 2012-08-02 MED ORDER — METOCLOPRAMIDE HCL 5 MG/ML IJ SOLN
10.0000 mg | Freq: Once | INTRAMUSCULAR | Status: AC
  Administered 2012-08-02: 10 mg via INTRAVENOUS
  Filled 2012-08-02: qty 2

## 2012-08-02 NOTE — ED Notes (Addendum)
Pt has been trying to treat a migraine for 6 days now; has tried home px meds and been to MD's office. Reports she feels like her head is going to explode; no fevers. Pt has started Clomid.

## 2012-08-02 NOTE — ED Notes (Signed)
Denies neck stiffness and can touch chin to chest.

## 2012-08-02 NOTE — ED Notes (Signed)
Pt. Reports migraine for the past several days. States that she has tried home treatment and received Toradol/Bendadryl shots from Dr which has not helped. Pt. Taking Clomid and is trying to get pregnant.

## 2012-08-03 LAB — POCT PREGNANCY, URINE: Preg Test, Ur: NEGATIVE

## 2012-08-03 MED ORDER — LORAZEPAM 2 MG/ML IJ SOLN: 1.0000 mg | Freq: Once | INTRAMUSCULAR | Status: DC

## 2012-08-03 MED ORDER — ONDANSETRON HCL 4 MG/2ML IJ SOLN
4.0000 mg | Freq: Once | INTRAMUSCULAR | Status: AC
  Administered 2012-08-03: 4 mg via INTRAVENOUS
  Filled 2012-08-03: qty 2

## 2012-08-03 MED ORDER — LORAZEPAM 2 MG/ML IJ SOLN
1.0000 mg | Freq: Once | INTRAMUSCULAR | Status: AC
  Administered 2012-08-03: 1 mg via INTRAVENOUS
  Filled 2012-08-03: qty 1

## 2012-08-03 MED ORDER — ONDANSETRON HCL 8 MG PO TABS: 8.0000 mg | ORAL_TABLET | Freq: Three times a day (TID) | ORAL | Status: AC | PRN

## 2012-08-03 MED ORDER — DIPHENHYDRAMINE HCL 50 MG/ML IJ SOLN
25.0000 mg | Freq: Once | INTRAMUSCULAR | Status: AC
  Administered 2012-08-03: 25 mg via INTRAVENOUS
  Filled 2012-08-03: qty 1

## 2012-08-03 NOTE — ED Notes (Signed)
The patient is AOx4 and comfortable with her discharge instructions.

## 2012-08-03 NOTE — ED Provider Notes (Addendum)
History     CSN: [id number]  Arrival date & time 08/02/12  2035   First MD Initiated Contact with Patient 08/02/12 2125      Chief Complaint  Patient presents with  . Headache    (Consider location/radiation/quality/duration/timing/severity/associated sxs/prior treatment) HPI Comments: [name] is a 36 y.o. Female who has had a persistent headache for 6 days. She has taken several different medications without relief. It feels like her typical migraine, but has lasted much longer than usual. She is in her second round of fertility treatment with Clomid. Last menstrual period 07/19/12. She is monitoring urine daily for ovulation, and has not ovulated this month. Indicating that she is not pregnant. She's had mild, nausea without significant vomiting. She denies fever, chills, cough, shortness of breath, or chest pain.  Patient is a 36 y.o. female presenting with headaches. The history is provided by the patient.  Headache     Past Medical History  Diagnosis Date  . Crohn disease 2000  . Asthma   . Endometriosis   . Cat allergies   . Dysautonomia     recurrent syncoe s/p ILR by Dr Doreatha Lew  . Palpitations     Past Surgical History  Procedure Date  . Ileocolectomy   . Loop recorder implant 12/10    by DR Doreatha Lew  . Knee surgery 1990    1988  . Cesarean section     2011, emergency  CS due to placental abruption  . Colon surgery 2002    partial removed due to crohns  . Appendectomy   . Tonsilectomy, adenoidectomy, bilateral myringotomy and tubes   . US echocardiography 11/11/2009    EF 55-60%    Family History  Problem Relation Age of Onset  . Hypertension Mother   . Hyperlipidemia Mother   . Arthritis Mother   . Diabetes Father   . Coronary artery disease Father   . Heart disease Father 23    MI age 59  . Hyperlipidemia Brother   . Hypertension Brother     History  Substance Use Topics  . Smoking status: Never Smoker   . Smokeless tobacco: Never Used    . Alcohol Use: Yes     occasionally    OB History    Grav Para Term Preterm Abortions TAB SAB Ect Mult Living                  Review of Systems  Neurological: Positive for headaches.  All other systems reviewed and are negative.    Allergies  Clarithromycin; Codeine; Hydrocodone; Promethazine hcl; and Sulfonamide derivatives  Home Medications   Current Outpatient Rx  Name Route Sig Dispense Refill  . ACETAMINOPHEN 500 MG PO TABS Oral Take 1,000 mg by mouth every 6 (six) hours as needed. For pain    . ALPRAZOLAM 0.25 MG PO TABS Oral Take 0.25-0.5 mg by mouth daily as needed. For anxiety    . BUTALBITAL-APAP-CAFFEINE 50-325-40 MG PO TABS Oral Take 1 tablet by mouth 2 (two) times daily as needed. For migraines    . CETIRIZINE HCL 10 MG PO TABS Oral Take 10 mg by mouth daily.    Marland Kitchen CLOMIPHENE CITRATE 50 MG PO TABS Oral Take 50 mg by mouth daily.    . CYANOCOBALAMIN 1000 MCG/ML IJ SOLN Intramuscular Inject 1,000 mcg into the muscle once a week.     . DEXLANSOPRAZOLE 60 MG PO CPDR Oral Take 60 mg by mouth daily.    . MORPHINE SULFATE  15 MG PO TABS Oral Take 15 mg by mouth every 4 (four) hours as needed.    Marland Kitchen NARATRIPTAN HCL 2.5 MG PO TABS Oral Take 2.5 mg by mouth as needed. Take one (1) tablet at onset of headache; if returns or does not resolve, may repeat after 4 hours; do not exceed five (5) mg in 24 hours.    . OMEPRAZOLE 20 MG PO CPDR Oral Take 20 mg by mouth daily.    Marland Kitchen PRENATAL VITAMINS (DIS) PO TABS Oral Take 1 tablet by mouth daily.      Marland Kitchen ZOLPIDEM TARTRATE 10 MG PO TABS Oral Take 1 tablet (10 mg total) by mouth at bedtime. Scheduled. 30 tablet 1    BP 134/89  Pulse 101  Temp 98.4 F (36.9 C) (Oral)  Resp 15  SpO2 100%  LMP 07/19/2012  Physical Exam  Nursing note and vitals reviewed. Constitutional: She is oriented to person, place, and time. She appears well-developed and well-nourished.  HENT:  Head: Normocephalic and atraumatic.  Eyes: Conjunctivae and EOM  are normal. Pupils are equal, round, and reactive to light.  Neck: Normal range of motion and phonation normal. Neck supple.  Cardiovascular: Normal rate, regular rhythm and intact distal pulses.   Pulmonary/Chest: Effort normal and breath sounds normal. She exhibits no tenderness.  Abdominal: Soft. She exhibits no distension. There is no tenderness. There is no guarding.  Musculoskeletal: Normal range of motion.  Neurological: She is alert and oriented to person, place, and time. She has normal strength. She exhibits normal muscle tone.       Normal gait  Skin: Skin is warm and dry.  Psychiatric: She has a normal mood and affect. Her behavior is normal. Judgment and thought content normal.    ED Course  Procedures (including critical care time)  Emergency department treatment: Zofran, pretreatment, followed by DHE 1 mg IV.   After treatment. Patient began vomiting. She received secondary medications of Reglan, and the second dose of Zofran ordered  0040- Reevaluation-  her headache is "significantly better" , but she is still nauseated. Second dose of Zofran is pending        1. Migraine       MDM  Persistent migraine headache. Doubt meningitis, encephalopathy, metabolic instability or impending vascular collapse.   Care to Dr. Sharol Given to disposition.         Richarda Blade, MD 08/03/12 0041  Richarda Blade, MD 08/03/12 [phone]

## 2012-08-15 ENCOUNTER — Other Ambulatory Visit: Payer: Self-pay

## 2012-08-15 MED ORDER — ZOLPIDEM TARTRATE 10 MG PO TABS: 10.0000 mg | ORAL_TABLET | Freq: Every day | ORAL | Status: DC

## 2012-08-15 NOTE — Telephone Encounter (Signed)
Medication phoned to [facility] as instructed. Patient notified as instructed by telephone v/m to ck with pharmacy.

## 2012-08-15 NOTE — Telephone Encounter (Signed)
Pt left v/m request refill on Ambien to [name]. Pt thought request for refill was denied by Dr Diona Browner, pharmacist at [place] said has not gotten denial. Pt said she takes with chemotherapy. Pt will be out of med this weekend and request refill done today. Pt request call back.Please advise.

## 2012-09-15 ENCOUNTER — Encounter: Payer: Self-pay | Admitting: Internal Medicine

## 2012-09-15 ENCOUNTER — Ambulatory Visit (INDEPENDENT_AMBULATORY_CARE_PROVIDER_SITE_OTHER): Payer: PRIVATE HEALTH INSURANCE | Admitting: *Deleted

## 2012-09-15 DIAGNOSIS — R55 Syncope and collapse: Secondary | ICD-10-CM

## 2012-09-15 LAB — PACEMAKER DEVICE OBSERVATION

## 2012-09-15 NOTE — Progress Notes (Signed)
ilr check in clinic

## 2012-09-29 ENCOUNTER — Telehealth: Payer: Self-pay

## 2012-09-29 DIAGNOSIS — F419 Anxiety disorder, unspecified: Secondary | ICD-10-CM

## 2012-09-29 NOTE — Telephone Encounter (Signed)
Pt request referral psychiatrist in [place];pt undergoing infertility treatment and pt feeling sad and anxious with panic attacks on and off. Pt does not have feelings of causing harm to herself or others.Please advise.

## 2012-09-30 NOTE — Telephone Encounter (Signed)
Referral done. Per Rosaria Ferries; she called the patient and gave her the information and telephone numbers that she would need to call and schedule an appointment with a psychiatrist.

## 2012-10-02 ENCOUNTER — Encounter: Payer: Self-pay | Admitting: Internal Medicine

## 2012-10-02 ENCOUNTER — Telehealth: Payer: Self-pay | Admitting: Family Medicine

## 2012-10-02 ENCOUNTER — Ambulatory Visit (INDEPENDENT_AMBULATORY_CARE_PROVIDER_SITE_OTHER): Payer: PRIVATE HEALTH INSURANCE | Admitting: Internal Medicine

## 2012-10-02 VITALS — BP 120/70 | HR 111 | Temp 98.5°F | Wt 147.0 lb

## 2012-10-02 DIAGNOSIS — N3090 Cystitis, unspecified without hematuria: Secondary | ICD-10-CM | POA: Insufficient documentation

## 2012-10-02 DIAGNOSIS — Z87440 Personal history of urinary (tract) infections: Secondary | ICD-10-CM

## 2012-10-02 LAB — POCT URINALYSIS DIPSTICK
Bilirubin, UA: NEGATIVE
Blood, UA: NEGATIVE
Leukocytes, UA: NEGATIVE
Nitrite, UA: NEGATIVE
Urobilinogen, UA: NEGATIVE
pH, UA: 6.5

## 2012-10-02 MED ORDER — CEFTRIAXONE SODIUM 1 G IJ SOLR
1.0000 g | Freq: Once | INTRAMUSCULAR | Status: AC
  Administered 2012-10-02: 1 g via INTRAMUSCULAR

## 2012-10-02 MED ORDER — CEPHALEXIN 250 MG PO TABS: 250.0000 mg | ORAL_TABLET | Freq: Three times a day (TID) | ORAL | Status: DC

## 2012-10-02 NOTE — Addendum Note (Signed)
Addended by: Despina Hidden on: 10/02/2012 02:45 PM   Modules accepted: Orders

## 2012-10-02 NOTE — Telephone Encounter (Signed)
Called again for urine culture, patient in office now

## 2012-10-02 NOTE — Patient Instructions (Signed)
Please rehydrate yourself with pedialye---take at least 1-2 quarts today

## 2012-10-02 NOTE — Telephone Encounter (Signed)
Caller: Insurance claims handler; Patient Name: [name]; PCP: Eliezer Lofts [facility]); Best Callback Phone Number: [phone].  Call regarding kidney infection.  Onset 10/01/12.  Afebrile.  Pt called OB-GYN and was prescribed Macrobid and pt has been throwing up all night. Her  Gyn MD referred her to her PCP.  Pt taking Clomid for the past 5 months and when she ovulates she gets an urinary infection.  Advised pt of ED and she preferred an appt.  Urgent symptoms positive due to ' Unbearable abdominal/pelvic pain' per [facility] protocol.  See ED.  Appt scheduled at 1330 with Dr. Kara Pacer as a precaution.      PLEASE FOLLOW UP WITH PT IN REGARD APPT TO SEE IMMEDIATELY.  Thank you.

## 2012-10-02 NOTE — Telephone Encounter (Signed)
1:30PM with me See if you can get urine results from gyn

## 2012-10-02 NOTE — Telephone Encounter (Signed)
Called GYN Dr. Harle Battiest, but office was closed for lunch so faxed over a request to get urine results, also pt advise me that last 2 urine cx were done after she had completed antibiotics but she was at [facility] hospital 3 months ago and they did a urine culture then

## 2012-10-02 NOTE — Telephone Encounter (Signed)
Agree iIf vomiting will likely need IV antibiotics, will not be able to take orals.. Recommend ER for IV antibiotics and fluids today.  If refuses have her come to office now to work in my schedule.. Could try to give her IM injection of antibiotics.  Get urine results, urine culture from GYN if done.

## 2012-10-02 NOTE — Progress Notes (Signed)
Subjective:    Patient ID: [name], female    DOB: 1976-04-02, 36 y.o.   MRN: [id number]  HPI Started with urinary symptoms 2 days ago this time Gyn started her on macrobid since she is awaiting ovulation (Dr Melinda Crutch) Started on macrobid after intercourse and the next morning in attempt at prophylaxis--but macrobid was making her sick Tried taking with zofran didn't work  Started on [employer] after culture when she was on the macrobid--last month  Progresses quickly when her infections come on Did require rocephin in ER in August  No fever Has lower abdominal pain and back pain Sensitive around ureter but no distinct burning dysuria  Current Outpatient Prescriptions on File Prior to Visit  Medication Sig Dispense Refill  . acetaminophen (TYLENOL) 500 MG tablet Take 1,000 mg by mouth every 6 (six) hours as needed. For pain      . ALPRAZolam (XANAX) 0.25 MG tablet Take 0.25-0.5 mg by mouth daily as needed. For anxiety      . butalbital-acetaminophen-caffeine (FIORICET, ESGIC) 50-325-40 MG per tablet Take 1 tablet by mouth 2 (two) times daily as needed. For migraines      . cetirizine (ZYRTEC) 10 MG tablet Take 10 mg by mouth daily.      . clomiPHENE (CLOMID) 50 MG tablet Take 150 mg by mouth daily.       . cyanocobalamin (,VITAMIN B-12,) 1000 MCG/ML injection Inject 1,000 mcg into the muscle once a week.       Marland Kitchen dexlansoprazole (DEXILANT) 60 MG capsule Take 60 mg by mouth daily.      . naratriptan (AMERGE) 2.5 MG tablet Take 2.5 mg by mouth as needed. Take one (1) tablet at onset of headache; if returns or does not resolve, may repeat after 4 hours; do not exceed five (5) mg in 24 hours.      Marland Kitchen omeprazole (PRILOSEC) 20 MG capsule Take 20 mg by mouth daily.      . Prenatal Vitamins (DIS) TABS Take 1 tablet by mouth daily.        Marland Kitchen zolpidem (AMBIEN) 10 MG tablet Take 1 tablet (10 mg total) by mouth at bedtime. Scheduled.  30 tablet  1    Allergies  Allergen Reactions  .  Clarithromycin Other (See Comments)    Reacts with chron's disease  . Codeine Nausea And Vomiting  . Hydrocodone Nausea And Vomiting  . Promethazine Hcl Other (See Comments)    Muscular seizures  . Sulfonamide Derivatives Other (See Comments)    Internal bleeding    Past Medical History  Diagnosis Date  . Crohn disease 2000  . Asthma   . Endometriosis   . Cat allergies   . Dysautonomia     recurrent syncoe s/p ILR by Dr Doreatha Lew  . Palpitations     Past Surgical History  Procedure Date  . Ileocolectomy   . Loop recorder implant 12/10    by DR Doreatha Lew  . Knee surgery 1990    1988  . Cesarean section     2011, emergency  CS due to placental abruption  . Colon surgery 2002    partial removed due to crohns  . Appendectomy   . Tonsilectomy, adenoidectomy, bilateral myringotomy and tubes   . US echocardiography 11/11/2009    EF 55-60%    Family History  Problem Relation Age of Onset  . Hypertension Mother   . Hyperlipidemia Mother   . Arthritis Mother   . Diabetes Father   . Coronary  artery disease Father   . Heart disease Father 47    MI age 6  . Hyperlipidemia Brother   . Hypertension Brother     History   Social History  . Marital Status: Married    Spouse Name: N/A    Number of Children: N/A  . Years of Education: N/A   Occupational History  .  Other    Airline pilot   Social History Main Topics  . Smoking status: Never Smoker   . Smokeless tobacco: Never Used  . Alcohol Use: Yes     occasionally  . Drug Use: No  . Sexually Active: Yes   Other Topics Concern  . Not on file   Social History Narrative  . No narrative on file   Review of Systems Having nausea--did take zofran 16m at 10AM Only tolerated a couple of sips of gatorade today No diarrhea    Objective:   Physical Exam  Constitutional: She appears well-developed and well-nourished. No distress.       Looks a little washed out but no distress and not toxic appearing    Abdominal: She exhibits no distension. There is no rebound and no guarding.       Moderate suprapubic tenderness and mild in RUQ--she thinks this is chronic  Musculoskeletal:       No CVA tenderness Does have discomfort along lumbar paraspinals bilaterally          Assessment & Plan:

## 2012-10-02 NOTE — Assessment & Plan Note (Signed)
Really unclear situation Could be recurrent post-coital bladder infections and GI intolerance of the macrobid ?non infectious cystitis She has gotten sick with these "infections" though Currently is mildly dehydrated Will give rocephin x 1 but avoid other antibiotics since urinalysis is benign

## 2012-10-04 ENCOUNTER — Inpatient Hospital Stay (HOSPITAL_COMMUNITY)
Admission: AD | Admit: 2012-10-04 | Discharge: 2012-10-04 | Disposition: A | Discharge status: Home / Self Care | Payer: PRIVATE HEALTH INSURANCE | Source: Ambulatory Visit | Attending: Obstetrics and Gynecology | Admitting: Obstetrics and Gynecology

## 2012-10-04 ENCOUNTER — Encounter (HOSPITAL_COMMUNITY): Payer: Self-pay | Admitting: Obstetrics and Gynecology

## 2012-10-04 ENCOUNTER — Inpatient Hospital Stay (HOSPITAL_COMMUNITY): Payer: PRIVATE HEALTH INSURANCE

## 2012-10-04 DIAGNOSIS — E860 Dehydration: Secondary | ICD-10-CM | POA: Insufficient documentation

## 2012-10-04 DIAGNOSIS — R109 Unspecified abdominal pain: Secondary | ICD-10-CM | POA: Insufficient documentation

## 2012-10-04 DIAGNOSIS — R112 Nausea with vomiting, unspecified: Secondary | ICD-10-CM | POA: Insufficient documentation

## 2012-10-04 LAB — CBC WITH DIFFERENTIAL/PLATELET
Basophils Absolute: 0 10*3/uL (ref 0.0–0.1)
HCT: 36.3 % (ref 36.0–46.0)
Hemoglobin: 11.9 g/dL — ABNORMAL LOW (ref 12.0–15.0)
Lymphocytes Relative: 16 % (ref 12–46)
Lymphs Abs: 1.6 10*3/uL (ref 0.7–4.0)
MCV: 87.7 fL (ref 78.0–100.0)
Monocytes Absolute: 0.5 10*3/uL (ref 0.1–1.0)
Neutro Abs: 8 10*3/uL — ABNORMAL HIGH (ref 1.7–7.7)
RBC: 4.14 MIL/uL (ref 3.87–5.11)
RDW: 12.4 % (ref 11.5–15.5)
WBC: 10.1 10*3/uL (ref 4.0–10.5)

## 2012-10-04 LAB — COMPREHENSIVE METABOLIC PANEL
ALT: 26 U/L (ref 0–35)
AST: 28 U/L (ref 0–37)
CO2: 22 mEq/L (ref 19–32)
Chloride: 102 mEq/L (ref 96–112)
Creatinine, Ser: 0.67 mg/dL (ref 0.50–1.10)
GFR calc Af Amer: >90 mL/min (ref >90)
GFR calc non Af Amer: >90 mL/min (ref >90)
Glucose, Bld: 76 mg/dL (ref 70–99)
Total Bilirubin: 0.2 mg/dL — ABNORMAL LOW (ref 0.3–1.2)

## 2012-10-04 LAB — URINALYSIS, ROUTINE W REFLEX MICROSCOPIC
Ketones, ur: >80 mg/dL — AB
Nitrite: NEGATIVE
Protein, ur: NEGATIVE mg/dL
Urobilinogen, UA: 0.2 mg/dL (ref 0.0–1.0)

## 2012-10-04 LAB — URINE MICROSCOPIC-ADD ON

## 2012-10-04 MED ORDER — ONDANSETRON HCL 4 MG/2ML IJ SOLN
4.0000 mg | Freq: Once | INTRAMUSCULAR | Status: AC
  Administered 2012-10-04: 4 mg via INTRAVENOUS
  Filled 2012-10-04: qty 2

## 2012-10-04 MED ORDER — DEXTROSE IN LACTATED RINGERS 5 % IV SOLN
INTRAVENOUS | Status: DC
  Administered 2012-10-04: 18:00:00 via INTRAVENOUS

## 2012-10-04 MED ORDER — DEXTROSE 5 % IN LACTATED RINGERS IV BOLUS
1000.0000 mL | Freq: Once | INTRAVENOUS | Status: AC
  Administered 2012-10-04: 1000 mL via INTRAVENOUS

## 2012-10-04 MED ORDER — LACTATED RINGERS IV BOLUS (SEPSIS)
1000.0000 mL | Freq: Once | INTRAVENOUS | Status: AC
  Administered 2012-10-04: 1000 mL via INTRAVENOUS

## 2012-10-04 MED ORDER — METOCLOPRAMIDE HCL 5 MG/ML IJ SOLN
10.0000 mg | Freq: Once | INTRAMUSCULAR | Status: AC
  Administered 2012-10-04: 10 mg via INTRAVENOUS
  Filled 2012-10-04: qty 2

## 2012-10-04 NOTE — MAU Note (Signed)
Patient states she has been on Clomid for 5 months. During ovulation she has abdominal pain and a UTI. Has been on antibiotics. Has been vomiting for 3 days, not able to keep anything down.

## 2012-10-04 NOTE — MAU Provider Note (Signed)
History     CSN: [id number]  Arrival date and time: 10/04/12 1210   First Provider Initiated Contact with Patient 10/04/12 1435      Chief Complaint  Patient presents with  . Abdominal Pain  . Emesis   HPI [name] is a 36 y.o. female who presents to MAU with abdominal pain. The pain started 3 days ago. She went to the office and was treated for UTI with Rocephin IM and Rx for Keflex. Unable to keep Keflex down. Has had had persistent vomiting for the past 2 days. Patient also c/o headache. Patient taking Clomid for the past 6 months and every month during ovulation gets very sick. Last sexual intercourse last night. The history was provided by the patient.  OB History    Grav Para Term Preterm Abortions TAB SAB Ect Mult Living                  Past Medical History  Diagnosis Date  . Crohn disease 2000  . Asthma   . Endometriosis   . Cat allergies   . Dysautonomia     recurrent syncoe s/p ILR by Dr Doreatha Lew  . Palpitations     Past Surgical History  Procedure Date  . Ileocolectomy   . Loop recorder implant 12/10    by DR Doreatha Lew  . Knee surgery 1990    1988  . Cesarean section     2011, emergency  CS due to placental abruption  . Colon surgery 2002    partial removed due to crohns  . Appendectomy   . Tonsilectomy, adenoidectomy, bilateral myringotomy and tubes   . US echocardiography 11/11/2009    EF 55-60%    Family History  Problem Relation Age of Onset  . Hypertension Mother   . Hyperlipidemia Mother   . Arthritis Mother   . Diabetes Father   . Coronary artery disease Father   . Heart disease Father 72    MI age 101  . Hyperlipidemia Brother   . Hypertension Brother     History  Substance Use Topics  . Smoking status: Never Smoker   . Smokeless tobacco: Never Used  . Alcohol Use: Yes     occasionally    Allergies:  Allergies  Allergen Reactions  . Clarithromycin Other (See Comments)    Reacts with chron's disease  . Codeine Nausea  And Vomiting  . Hydrocodone Nausea And Vomiting  . Promethazine Hcl Other (See Comments)    Muscular seizures  . Sulfonamide Derivatives Other (See Comments)    Internal bleeding    Prescriptions prior to admission  Medication Sig Dispense Refill  . acetaminophen (TYLENOL) 500 MG tablet Take 1,000 mg by mouth every 6 (six) hours as needed. For pain      . ALPRAZolam (XANAX) 0.25 MG tablet Take 0.25-0.5 mg by mouth daily as needed. For anxiety      . butalbital-acetaminophen-caffeine (FIORICET, ESGIC) 50-325-40 MG per tablet Take 1 tablet by mouth 2 (two) times daily as needed. For migraines      . Cephalexin 250 MG tablet Take 1 tablet (250 mg total) by mouth 3 (three) times daily.  30 tablet  0  . cetirizine (ZYRTEC) 10 MG tablet Take 10 mg by mouth daily.      . clomiPHENE (CLOMID) 50 MG tablet Take 150 mg by mouth daily.       . cyanocobalamin (,VITAMIN B-12,) 1000 MCG/ML injection Inject 1,000 mcg into the muscle once a week.       Marland Kitchen  dexlansoprazole (DEXILANT) 60 MG capsule Take 60 mg by mouth daily.      . naratriptan (AMERGE) 2.5 MG tablet Take 2.5 mg by mouth as needed. Take one (1) tablet at onset of headache; if returns or does not resolve, may repeat after 4 hours; do not exceed five (5) mg in 24 hours.      Marland Kitchen omeprazole (PRILOSEC) 20 MG capsule Take 20 mg by mouth daily.      . Prenatal Vitamins (DIS) TABS Take 1 tablet by mouth daily.        Marland Kitchen zolpidem (AMBIEN) 10 MG tablet Take 1 tablet (10 mg total) by mouth at bedtime. Scheduled.  30 tablet  1    Review of Systems  Constitutional: Positive for weight loss. Negative for fever and chills.  HENT: Negative for ear pain, nosebleeds, congestion, sore throat and neck pain.   Eyes: Negative for blurred vision, double vision, photophobia and pain.  Respiratory: Negative for cough, shortness of breath and wheezing.   Gastrointestinal: Positive for nausea, vomiting and abdominal pain. Negative for heartburn, diarrhea and  constipation.  Genitourinary: Positive for dysuria and frequency. Negative for urgency.  Musculoskeletal: Positive for back pain. Negative for myalgias.  Skin: Negative for itching and rash.  Neurological: Positive for headaches. Negative for dizziness, sensory change, speech change, seizures and weakness.  Endo/Heme/Allergies: Does not bruise/bleed easily.  Psychiatric/Behavioral: Positive for depression. The patient is not nervous/anxious and does not have insomnia.    Physical Exam   Blood pressure 105/84, pulse 91, temperature 98.2 F (36.8 C), resp. rate 16, height 5' 3.75" (1.619 m), weight 145 lb 6.4 oz (65.953 kg), last menstrual period 09/18/2012, SpO2 100.00%.  Physical Exam  Nursing note and vitals reviewed. Constitutional: She is oriented to person, place, and time. She appears well-developed and well-nourished. No distress.  HENT:  Head: Normocephalic and atraumatic.  Eyes: EOM are normal.  Neck: Neck supple.  Cardiovascular: Normal rate.   Respiratory: Effort normal.  GI: Soft. There is tenderness in the right lower quadrant and suprapubic area. There is no rigidity, no rebound, no guarding and no CVA tenderness.  Musculoskeletal: Normal range of motion.  Neurological: She is alert and oriented to person, place, and time.  Skin: Skin is warm and dry.  Psychiatric: She has a normal mood and affect. Her behavior is normal. Judgment and thought content normal.   Results for orders placed during the hospital encounter of 10/04/12 (from the past 24 hour(s))  URINALYSIS, ROUTINE W REFLEX MICROSCOPIC     Status: Abnormal   Collection Time   10/04/12 12:35 PM      Component Value Range   Color, Urine YELLOW  YELLOW   APPearance HAZY (*) CLEAR   Specific Gravity, Urine >1.030 (*) 1.005 - 1.030   pH 6.0  5.0 - 8.0   Glucose, UA NEGATIVE  NEGATIVE mg/dL   Hgb urine dipstick TRACE (*) NEGATIVE   Bilirubin Urine SMALL (*) NEGATIVE   Ketones, ur >80 (*) NEGATIVE mg/dL    Protein, ur NEGATIVE  NEGATIVE mg/dL   Urobilinogen, UA 0.2  0.0 - 1.0 mg/dL   Nitrite NEGATIVE  NEGATIVE   Leukocytes, UA NEGATIVE  NEGATIVE  URINE MICROSCOPIC-ADD ON     Status: Abnormal   Collection Time   10/04/12 12:35 PM      Component Value Range   Squamous Epithelial / LPF MANY (*) RARE   WBC, UA 0-2  <3 WBC/hpf   RBC / HPF 3-6  <3 RBC/hpf  Bacteria, UA RARE  RARE   Urine-Other MUCOUS PRESENT     Assessment: 36 y.o. female with nausea, vomiting and abdominal pain   Dehydration  Plan:  IV hydration, Labs and ultrasound   Zofran 4 mg IV   Discussed with Dr. Rogue Bussing and she will see the patient in MAU   Medical screening exam complete and patient stable to await Dr. Rogue Bussing  Procedures   NEESE,HOPE, RN, [place], [facility] 10/04/2012, 2:36 PM

## 2012-10-04 NOTE — H&P (Signed)
36 y.o. yo complains of nausea, vomiting and abdominal pain.  Pt states that she is on her 5th Clomid cycle for infertility and has a history of having severe pain at ovulation.  This dates back to age 21, but has been particularly problematic while on Clomid.  She states that she also has had frequent UTIs during these episodes and has been on macrobid suppression.  She states that last Wed she received a dose of Rocephin followed by a course that she is currently completing of Keflex. She states that she has thrown up keflex over the past 2 days and has been unable to keep down any food.  She states that she she suffered from hyperemesis gravidarum during her pregnancy 2 yrs ago requiring a PICC line and was delivered early for absent end diastolic flow.  The pain in her abdomen is low and sore, mainly on the right and is similar to pain she has had in the past.  She denies any constipation or diarrhea.  She denies back pain or fever.  Pt states despite the pain and nausea/vomiting, she and her husband attempted intercourse last night and vulva has felt sore and painful since.  She denies and abnormal discharge.  Past Medical History  Diagnosis Date  . Crohn disease 2000  . Asthma   . Endometriosis   . Cat allergies   . Dysautonomia     recurrent syncoe s/p ILR by Dr Doreatha Lew  . Palpitations    Past Surgical History  Procedure Date  . Ileocolectomy   . Loop recorder implant 12/10    by DR Doreatha Lew  . Knee surgery 1990    1988  . Cesarean section     2011, emergency  CS due to placental abruption  . Colon surgery 2002    partial removed due to crohns  . Appendectomy   . Tonsilectomy, adenoidectomy, bilateral myringotomy and tubes   . US echocardiography 11/11/2009    EF 55-60%    History   Social History  . Marital Status: Married    Spouse Name: N/A    Number of Children: N/A  . Years of Education: N/A   Occupational History  .  Other    Airline pilot   Social History  Main Topics  . Smoking status: Never Smoker   . Smokeless tobacco: Never Used  . Alcohol Use: Yes     occasionally  . Drug Use: No  . Sexually Active: Yes   Other Topics Concern  . Not on file   Social History Narrative  . No narrative on file    No current facility-administered medications on file prior to encounter.   Current Outpatient Prescriptions on File Prior to Encounter  Medication Sig Dispense Refill  . acetaminophen (TYLENOL) 500 MG tablet Take 1,000 mg by mouth every 6 (six) hours as needed. For pain      . ALPRAZolam (XANAX) 0.25 MG tablet Take 0.25-0.5 mg by mouth daily as needed. For anxiety      . butalbital-acetaminophen-caffeine (FIORICET, ESGIC) 50-325-40 MG per tablet Take 1 tablet by mouth 2 (two) times daily as needed. For migraines      . Cephalexin 250 MG tablet Take 1 tablet (250 mg total) by mouth 3 (three) times daily.  30 tablet  0  . cetirizine (ZYRTEC) 10 MG tablet Take 10 mg by mouth daily.      . clomiPHENE (CLOMID) 50 MG tablet Take 150 mg by mouth daily.       Marland Kitchen  dexlansoprazole (DEXILANT) 60 MG capsule Take 60 mg by mouth daily.      . Prenatal Vitamins (DIS) TABS Take 1 tablet by mouth daily.        Marland Kitchen zolpidem (AMBIEN) 10 MG tablet Take 1 tablet (10 mg total) by mouth at bedtime. Scheduled.  30 tablet  1    Allergies  Allergen Reactions  . Sulfonamide Derivatives Other (See Comments)    Hematemesis; documented while a young child  . Clarithromycin Other (See Comments)    Reacts with chron's disease  . Macrobid (Nitrofurantoin) Nausea And Vomiting  . Codeine Nausea And Vomiting  . Hydrocodone Nausea And Vomiting  . Promethazine Hcl Other (See Comments)    Muscular seizures    @VITALS2 @  Lungs: clear to ascultation Cor:  RRR Abdomen:  soft, no rebound or guarding, nondistended, good bowel sounds, mild to moderate tenderness in RLQ. Ex:  no cords, erythema Pelvic:  Deferred internal, external examination unremarkable Back: no CVA  tenderness CBC/CMP: slight increase in WBC, otherwise nl Urine: Neg US pelvis: multiple follicles <7VG, largest 3.2 cm.  Both ovaries measuring approximately 5cm largest dimension.  Spoke with radiologist, he reported that there was flow noted to ovaries but wave forms were not performed.  No evidence of any edema or free fluid.  Otherwise normal.  A: Pelvic pain   P:  IVF hydration, antiemetics, monitoring for improvement of symptoms with discharge home if patient feeling better. Call if worsening.      Allyn Kenner

## 2012-10-06 ENCOUNTER — Encounter (HOSPITAL_COMMUNITY): Payer: Self-pay | Admitting: *Deleted

## 2012-10-06 ENCOUNTER — Inpatient Hospital Stay (HOSPITAL_COMMUNITY)
Admission: EM | Admit: 2012-10-06 | Discharge: 2012-10-11 | DRG: 387 | Disposition: A | Discharge status: Home / Self Care | Payer: PRIVATE HEALTH INSURANCE | Attending: Internal Medicine | Admitting: Internal Medicine

## 2012-10-06 DIAGNOSIS — K219 Gastro-esophageal reflux disease without esophagitis: Secondary | ICD-10-CM

## 2012-10-06 DIAGNOSIS — R197 Diarrhea, unspecified: Secondary | ICD-10-CM

## 2012-10-06 DIAGNOSIS — F39 Unspecified mood [affective] disorder: Secondary | ICD-10-CM | POA: Diagnosis present

## 2012-10-06 DIAGNOSIS — F411 Generalized anxiety disorder: Secondary | ICD-10-CM | POA: Insufficient documentation

## 2012-10-06 DIAGNOSIS — G43909 Migraine, unspecified, not intractable, without status migrainosus: Secondary | ICD-10-CM

## 2012-10-06 DIAGNOSIS — J4540 Moderate persistent asthma, uncomplicated: Secondary | ICD-10-CM

## 2012-10-06 DIAGNOSIS — Z79899 Other long term (current) drug therapy: Secondary | ICD-10-CM

## 2012-10-06 DIAGNOSIS — R1115 Cyclical vomiting syndrome unrelated to migraine: Secondary | ICD-10-CM

## 2012-10-06 DIAGNOSIS — I499 Cardiac arrhythmia, unspecified: Secondary | ICD-10-CM

## 2012-10-06 DIAGNOSIS — Z23 Encounter for immunization: Secondary | ICD-10-CM

## 2012-10-06 DIAGNOSIS — J309 Allergic rhinitis, unspecified: Secondary | ICD-10-CM

## 2012-10-06 DIAGNOSIS — Z881 Allergy status to other antibiotic agents status: Secondary | ICD-10-CM

## 2012-10-06 DIAGNOSIS — G909 Disorder of the autonomic nervous system, unspecified: Secondary | ICD-10-CM | POA: Diagnosis present

## 2012-10-06 DIAGNOSIS — K5090 Crohn's disease, unspecified, without complications: Secondary | ICD-10-CM

## 2012-10-06 DIAGNOSIS — K529 Noninfective gastroenteritis and colitis, unspecified: Secondary | ICD-10-CM

## 2012-10-06 DIAGNOSIS — Z792 Long term (current) use of antibiotics: Secondary | ICD-10-CM

## 2012-10-06 DIAGNOSIS — R112 Nausea with vomiting, unspecified: Secondary | ICD-10-CM

## 2012-10-06 DIAGNOSIS — F419 Anxiety disorder, unspecified: Secondary | ICD-10-CM

## 2012-10-06 DIAGNOSIS — R7402 Elevation of levels of lactic acid dehydrogenase (LDH): Secondary | ICD-10-CM | POA: Diagnosis present

## 2012-10-06 DIAGNOSIS — E876 Hypokalemia: Secondary | ICD-10-CM

## 2012-10-06 DIAGNOSIS — J029 Acute pharyngitis, unspecified: Secondary | ICD-10-CM

## 2012-10-06 DIAGNOSIS — Z888 Allergy status to other drugs, medicaments and biological substances status: Secondary | ICD-10-CM

## 2012-10-06 DIAGNOSIS — M0230 Reiter's disease, unspecified site: Secondary | ICD-10-CM

## 2012-10-06 DIAGNOSIS — Z885 Allergy status to narcotic agent status: Secondary | ICD-10-CM

## 2012-10-06 DIAGNOSIS — Z882 Allergy status to sulfonamides status: Secondary | ICD-10-CM

## 2012-10-06 DIAGNOSIS — H8110 Benign paroxysmal vertigo, unspecified ear: Secondary | ICD-10-CM

## 2012-10-06 DIAGNOSIS — K5010 Crohn's disease of large intestine without complications: Principal | ICD-10-CM | POA: Diagnosis present

## 2012-10-06 DIAGNOSIS — Z87440 Personal history of urinary (tract) infections: Secondary | ICD-10-CM

## 2012-10-06 DIAGNOSIS — N3090 Cystitis, unspecified without hematuria: Secondary | ICD-10-CM

## 2012-10-06 DIAGNOSIS — R7401 Elevation of levels of liver transaminase levels: Secondary | ICD-10-CM | POA: Diagnosis present

## 2012-10-06 DIAGNOSIS — Z9049 Acquired absence of other specified parts of digestive tract: Secondary | ICD-10-CM

## 2012-10-06 DIAGNOSIS — Z8669 Personal history of other diseases of the nervous system and sense organs: Secondary | ICD-10-CM

## 2012-10-06 DIAGNOSIS — R55 Syncope and collapse: Secondary | ICD-10-CM

## 2012-10-06 DIAGNOSIS — R945 Abnormal results of liver function studies: Secondary | ICD-10-CM | POA: Diagnosis present

## 2012-10-06 DIAGNOSIS — R5383 Other fatigue: Secondary | ICD-10-CM

## 2012-10-06 DIAGNOSIS — Z9089 Acquired absence of other organs: Secondary | ICD-10-CM

## 2012-10-06 DIAGNOSIS — N8300 Follicular cyst of ovary, unspecified side: Secondary | ICD-10-CM | POA: Diagnosis present

## 2012-10-06 DIAGNOSIS — R42 Dizziness and giddiness: Secondary | ICD-10-CM

## 2012-10-06 DIAGNOSIS — N200 Calculus of kidney: Secondary | ICD-10-CM

## 2012-10-06 DIAGNOSIS — J45909 Unspecified asthma, uncomplicated: Secondary | ICD-10-CM | POA: Diagnosis present

## 2012-10-06 DIAGNOSIS — Z8742 Personal history of other diseases of the female genital tract: Secondary | ICD-10-CM

## 2012-10-06 HISTORY — DX: Nausea: R11.0

## 2012-10-06 LAB — COMPREHENSIVE METABOLIC PANEL
AST: 67 U/L — ABNORMAL HIGH (ref 0–37)
Albumin: 3.9 g/dL (ref 3.5–5.2)
Alkaline Phosphatase: 78 U/L (ref 39–117)
BUN: 5 mg/dL — ABNORMAL LOW (ref 6–23)
CO2: 24 mEq/L (ref 19–32)
Chloride: 103 mEq/L (ref 96–112)
Creatinine, Ser: 0.69 mg/dL (ref 0.50–1.10)
GFR calc non Af Amer: >90 mL/min (ref >90)
Potassium: 3.6 mEq/L (ref 3.5–5.1)
Total Bilirubin: 0.3 mg/dL (ref 0.3–1.2)

## 2012-10-06 LAB — PREGNANCY, URINE: Preg Test, Ur: NEGATIVE

## 2012-10-06 LAB — URINALYSIS, ROUTINE W REFLEX MICROSCOPIC
Bilirubin Urine: NEGATIVE
Glucose, UA: NEGATIVE mg/dL
Hgb urine dipstick: NEGATIVE
Ketones, ur: >80 mg/dL — AB
Protein, ur: NEGATIVE mg/dL
pH: 6.5 (ref 5.0–8.0)

## 2012-10-06 LAB — CBC WITH DIFFERENTIAL/PLATELET
Basophils Absolute: 0 10*3/uL (ref 0.0–0.1)
Basophils Relative: 0 % (ref 0–1)
HCT: 41.7 % (ref 36.0–46.0)
Hemoglobin: 14.1 g/dL (ref 12.0–15.0)
Lymphocytes Relative: 20 % (ref 12–46)
Monocytes Absolute: 0.5 10*3/uL (ref 0.1–1.0)
Monocytes Relative: 5 % (ref 3–12)
Neutro Abs: 7.6 10*3/uL (ref 1.7–7.7)
Neutrophils Relative %: 75 % (ref 43–77)
WBC: 10.1 10*3/uL (ref 4.0–10.5)

## 2012-10-06 LAB — CLOSTRIDIUM DIFFICILE BY PCR: Toxigenic C. Difficile by PCR: NEGATIVE

## 2012-10-06 LAB — URINE MICROSCOPIC-ADD ON

## 2012-10-06 MED ORDER — LORAZEPAM 2 MG/ML IJ SOLN
1.0000 mg | Freq: Once | INTRAMUSCULAR | Status: AC
  Administered 2012-10-06: 1 mg via INTRAVENOUS
  Filled 2012-10-06: qty 1

## 2012-10-06 MED ORDER — COMPLETENATE 29-1 MG PO CHEW
1.0000 | CHEWABLE_TABLET | Freq: Every day | ORAL | Status: DC
  Filled 2012-10-06 (×2): qty 1

## 2012-10-06 MED ORDER — PRENATAL VITAMINS (DIS) PO TABS: 1.0000 | ORAL_TABLET | Freq: Every day | ORAL | Status: DC

## 2012-10-06 MED ORDER — ONDANSETRON HCL 4 MG/2ML IJ SOLN
4.0000 mg | Freq: Once | INTRAMUSCULAR | Status: AC
  Administered 2012-10-06: 4 mg via INTRAVENOUS
  Filled 2012-10-06: qty 2

## 2012-10-06 MED ORDER — KCL IN DEXTROSE-NACL 10-5-0.45 MEQ/L-%-% IV SOLN
INTRAVENOUS | Status: DC
  Administered 2012-10-06 – 2012-10-10 (×9): via INTRAVENOUS
  Administered 2012-10-11 (×2): 125 mL via INTRAVENOUS
  Filled 2012-10-06 (×17): qty 1000

## 2012-10-06 MED ORDER — ALPRAZOLAM 0.25 MG PO TABS: 0.2500 mg | ORAL_TABLET | Freq: Three times a day (TID) | ORAL | Status: DC | PRN

## 2012-10-06 MED ORDER — ONDANSETRON HCL 4 MG/2ML IJ SOLN: 4.0000 mg | Freq: Four times a day (QID) | INTRAMUSCULAR | Status: DC | PRN

## 2012-10-06 MED ORDER — SODIUM CHLORIDE 0.9 % IV SOLN
1000.0000 mL | Freq: Once | INTRAVENOUS | Status: AC
  Administered 2012-10-06: 1000 mL via INTRAVENOUS

## 2012-10-06 MED ORDER — ZOLPIDEM TARTRATE 5 MG PO TABS
10.0000 mg | ORAL_TABLET | Freq: Every day | ORAL | Status: DC
  Administered 2012-10-06 – 2012-10-10 (×5): 10 mg via ORAL
  Filled 2012-10-06 (×5): qty 2

## 2012-10-06 MED ORDER — ONDANSETRON 8 MG/NS 50 ML IVPB
8.0000 mg | Freq: Four times a day (QID) | INTRAVENOUS | Status: DC
  Administered 2012-10-07 – 2012-10-11 (×18): 8 mg via INTRAVENOUS
  Filled 2012-10-06 (×26): qty 8

## 2012-10-06 MED ORDER — LORAZEPAM 2 MG/ML IJ SOLN
0.5000 mg | Freq: Four times a day (QID) | INTRAMUSCULAR | Status: DC | PRN
  Administered 2012-10-08: 0.5 mg via INTRAVENOUS
  Filled 2012-10-06: qty 1

## 2012-10-06 MED ORDER — PNEUMOCOCCAL VAC POLYVALENT 25 MCG/0.5ML IJ INJ
0.5000 mL | INJECTION | INTRAMUSCULAR | Status: AC
  Administered 2012-10-08: 0.5 mL via INTRAMUSCULAR
  Filled 2012-10-06: qty 0.5

## 2012-10-06 MED ORDER — SODIUM CHLORIDE 0.9 % IV SOLN
1000.0000 mL | INTRAVENOUS | Status: DC
  Administered 2012-10-06: 1000 mL via INTRAVENOUS

## 2012-10-06 MED ORDER — PROCHLORPERAZINE EDISYLATE 5 MG/ML IJ SOLN
10.0000 mg | Freq: Four times a day (QID) | INTRAMUSCULAR | Status: DC | PRN
  Administered 2012-10-07 – 2012-10-11 (×13): 10 mg via INTRAVENOUS
  Filled 2012-10-06 (×15): qty 2

## 2012-10-06 MED ORDER — SODIUM CHLORIDE 0.9 % IV BOLUS (SEPSIS)
1000.0000 mL | Freq: Once | INTRAVENOUS | Status: AC
  Administered 2012-10-06: 1000 mL via INTRAVENOUS

## 2012-10-06 MED ORDER — ONDANSETRON 8 MG/NS 50 ML IVPB: 8.0000 mg | Freq: Four times a day (QID) | INTRAVENOUS | Status: DC | PRN

## 2012-10-06 MED ORDER — PANTOPRAZOLE SODIUM 40 MG IV SOLR
40.0000 mg | Freq: Two times a day (BID) | INTRAVENOUS | Status: DC
  Administered 2012-10-06 – 2012-10-11 (×10): 40 mg via INTRAVENOUS
  Filled 2012-10-06 (×13): qty 40

## 2012-10-06 MED ORDER — BUTALBITAL-APAP-CAFFEINE 50-325-40 MG PO TABS: 1.0000 | ORAL_TABLET | Freq: Two times a day (BID) | ORAL | Status: DC | PRN

## 2012-10-06 NOTE — ED Notes (Signed)
Family at bedside. 

## 2012-10-06 NOTE — H&P (Addendum)
[facility] History and Physical  [name] YIR:[id number] DOB: 1976/10/13 DOA: 10/06/2012  Referring physician: Mali Sheldon PCP: Eliezer Lofts, MD   Chief Complaint: Nausea and vomiting  HPI:   The patient is a 36 yo female with history of crohn's disease, asthma, endometriosis, dysautonomia, and palpitations who presents with a 5 day history of nausea and vomiting.  She last felt well on Tuesday.  Wednesday, she developed suprapubic pain and back pain and vulvar pain.  She has history of recurrent UTI after intercourse and had Macrobid at home so she started taking it on Wednesday.   She also went to see her primary care physician who gave her a shot of rocephin and started keflex and told her to use zofran for nausea.  She was also referred to urology because of her history of frequent UTI.  On Thursday and Friday she "rotted in bed" with nausea and vomiting.  She took Gatorade and ice chips and she is unsure how much she was vomiting because it was so frequent, but she denies blood.  It was occasionally mildly green tinged.  On Saturday, she called her OB/GYN who recommended going to [facility] because of risk for ruptured cyst.  She had four follicles on the right ovary and her OB/GYN did not think that that was the cause of her nausea.  They hydrated her and discharged her with more nausea medication.  Yesterday, she took xanax with zofran, which helped some.  Today, she continued to have severe nausea and vomiting, so she returned to the emergency department.    Of note, patient has multiple notes from other care providers that mention nausea as a common complaint.  Additionally, she states she required a PICC line for several months due to hyperemesis gravidarum two years ago.   Review of Systems:    She denies fevers, chills, uri symptoms, chest pains, shortness of breath.  She continues to have some vulvar sensitivity but her back and suprapubic pain have improved.  She has had  some diarrhea for the last three days, about 2-4 times per day without blood or mucous.  She denies rashes, joint swelling.  She has chronic arthralgias.  She has a lot of stress at home because she and her husband are trying to adopt a child in addition to trying to get pregnant.  She missed the last class due to nausea.     Past Medical History  Diagnosis Date  . Crohn disease 2000    remission  . Asthma     no intubations or ICU stays  . Endometriosis   . Cat allergies   . Dysautonomia     recurrent syncope s/p ILR by Dr Doreatha Lew  . Palpitations   . History of recurrent UTIs   . Chronic nausea     around ovulation time    Past Surgical History  Procedure Date  . Ileocolectomy   . Loop recorder implant 12/10    by DR Doreatha Lew  . Knee surgery 1990    1988  . Cesarean section     2011, emergency  CS due to placental abruption  . Colon surgery 2002    partial removed due to crohns  . Appendectomy   . Tonsilectomy, adenoidectomy, bilateral myringotomy and tubes   . US echocardiography 11/11/2009    EF 55-60%   Social History:  reports that she has never smoked. She has never used smokeless tobacco. She reports that she drinks alcohol. She reports that  she does not use illicit drugs. Lives in a house with husband and two year-old daughter.  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     Allergies  Allergen Reactions  . Sulfonamide Derivatives Other (See Comments)    Hematemesis; documented while a young child  . Clarithromycin Other (See Comments)    Reacts with chron's disease  . Macrobid (Nitrofurantoin) Nausea And Vomiting  . Codeine Nausea And Vomiting  . Hydrocodone Nausea And Vomiting  . Promethazine Hcl Other (See Comments)    Muscular seizures    Family History  Problem Relation Age of Onset  . Hypertension Mother   . Hyperlipidemia Mother   . Arthritis Mother   . Diabetes Father   . Coronary artery disease Father   . Heart disease Father 66    MI age  93  . Hyperlipidemia Brother   . Hypertension Brother     Prior to Admission medications   Medication Sig Start Date End Date Taking? Authorizing Provider  acetaminophen (TYLENOL) 500 MG tablet Take 1,000 mg by mouth every 6 (six) hours as needed. For pain   Yes Historical Provider, MD  ALPRAZolam (XANAX) 0.25 MG tablet Take 0.25-0.5 mg by mouth daily as needed. For anxiety 07/21/12  Yes Amy E Bedsole, MD  butalbital-acetaminophen-caffeine (FIORICET, ESGIC) 50-325-40 MG per tablet Take 1 tablet by mouth 2 (two) times daily as needed. For migraines   Yes Historical Provider, MD  Cephalexin 250 MG tablet Take 1 tablet (250 mg total) by mouth 3 (three) times daily. 10/02/12  Yes Venia Carbon, MD  cetirizine (ZYRTEC) 10 MG tablet Take 10 mg by mouth daily. 07/21/12  Yes Amy Cletis Athens, MD  dexlansoprazole (DEXILANT) 60 MG capsule Take 60 mg by mouth daily.   Yes Historical Provider, MD  ondansetron (ZOFRAN) 8 MG tablet Take 8 mg by mouth 2 (two) times daily as needed. As needed for nausea/vomiting.   Yes Historical Provider, MD  Prenatal Vitamins (DIS) TABS Take 1 tablet by mouth daily.     Yes Historical Provider, MD  sertraline (ZOLOFT) 25 MG tablet Take 25-50 mg by mouth daily.    Yes Historical Provider, MD  zolpidem (AMBIEN) 10 MG tablet Take 1 tablet (10 mg total) by mouth at bedtime. Scheduled. 08/15/12  Yes Amy Cletis Athens, MD  clomiPHENE (CLOMID) 50 MG tablet Take 150 mg by mouth daily.     Historical Provider, MD   Started clomid, last dose on Oct 3rd.    Physical Exam: Filed Vitals:   10/06/12 1430 10/06/12 1500 10/06/12 1911 10/06/12 2031  BP: 100/58 111/68 116/63 113/78  Pulse: 78 78 86 76  Temp:    98.7 F (37.1 C)  TempSrc:    Oral  Resp:   18 16  Height:    5' 4"  (1.626 m)  Weight:    69.5 kg (153 lb 3.5 oz)  SpO2: 99% 100% 100% 100%     General:  Average weight caucasian female, lying on stretcher, no acute distress  Eyes: PERRL, anicteric, noninjected  ENT:  Nares  and oropharynx nonerythematous, no plaques, moist mucous membranes  Neck: supple, no thyromegaly  Cardiovascular: RRR, no m/r/g, 2+ pulses  Respiratory: CTAB  Abdomen: hyperactive BS, soft, mildly tender along the lateral right upper and lower quadrants and the left lower quadrants without mass, rebound, or guarding.   Skin: No skin tenting, no rash  Musculoskeletal: Normal tone and bulk  Psychiatric:  Flat affect  Neurologic: III-XII grossly intact, strength 5/5, sensation  intact on arms and legs  Labs on Admission:  Basic Metabolic Panel:  Lab 84/16/60 0834 10/04/12 1440  NA 139 137  K 3.6 4.0  CL 103 102  CO2 24 22  GLUCOSE 109* 76  BUN 5* 9  CREATININE 0.69 0.67  CALCIUM 9.0 8.5  MG -- --  PHOS -- --   Liver Function Tests:  Lab 10/06/12 0834 10/04/12 1440  AST 67* 28  ALT 79* 26  ALKPHOS 78 66  BILITOT 0.3 0.2*  PROT 7.6 6.3  ALBUMIN 3.9 3.4*    Lab 10/06/12 0834  LIPASE 25  AMYLASE --   No results found for this basename: AMMONIA:5 in the last 168 hours CBC:  Lab 10/06/12 0834 10/04/12 1440  WBC 10.1 10.1  NEUTROABS 7.6 8.0*  HGB 14.1 11.9*  HCT 41.7 36.3  MCV 87.4 87.7  PLT 337 283   Cardiac Enzymes: No results found for this basename: CKTOTAL:5,CKMB:5,CKMBINDEX:5,TROPONINI:5 in the last 168 hours  BNP (last 3 results) No results found for this basename: PROBNP:3 in the last 8760 hours CBG: No results found for this basename: GLUCAP:5 in the last 168 hours  Radiological Exams on Admission: No results found.  EKG: None  Assessment/Plan Active Problems:  Crohn's disease  Dysautomnia  GERD (gastroesophageal reflux disease)  Migraine  Cystitis  Nausea & vomiting  Diarrhea  Anxiety  Nausea, vomiting, diarrhea with mild elevation of liver function tests and without elevated bilirubin.  Broad differential includes viral illness, C-dif, recurrent crohn's, dysautonomia (known diagnosis for this patient), abdominal migraine (hx of  migraines), cyclic vomiting, and side effect of medication.  She states she normally has nausea and vomiting when she ovulates, and she ovulated 3 days ago, and also with UTI, which she was treated for last week.  She has episodic abdominal pain and had abdominal US for same indication 12/10/11.  She denies excessive tylenol use.  Given low suspicion for viral hepatitis, gallstones at this time, will defer viral studies unless she continues to worsen and her LFTs trend up.  Of note, despite excessive vomiting, her urinalysis and bloodwork indicate she has been able to maintain hydration, but ketones are greatly elevated.  Pregnancy test negative and [name] from 2 days ago negative for ruptured cyst and ectopic pregnancy. -  C diff pending -  MIVF -  Zofran 39m q6h -  Compazine 125mq6h prn -  Protonix IV -  Ativan for breakthrough nausea not responding to zofran and compazine -  Consider Hep panels if LFTs worsen -  Consider abdominal CT if abdominal pain worsens and/or GI consult if concerned for reactivation of Crohn's -  Repeat electrolytes in AM -  Hold tylenol   Abnormal UA with small LE, 3-6 WBC, and few yeast.  Many squamous cells.   -  F/u urine culture.   Migraine HA, currently stable and asymptomatic -  Fioricet prn   Mood d/o/ anxiety:  Uncontrolled -  Hold sertraline for now b/c of common side effect of nausea and vomiting, but strongly encourage titration to maximum dose -  Minimize use of addictive benzodiazepines if possible  DIET:  Clear liquids  ACCESS:  PIV IVF:  D5 1/2 NS + 10KCL at 1253m --> Monitor potassium carefully, but kidney function is normal and she has ongoing gastric losses PROPH:  SCDs  Code Status:  Full code Family Communication:  Spoke with patient and husband, AndShadow Stiggersho is primary contact: [phone] phone Disposition Plan:  Pending able to  tolerate maintenance fluids by mouth  Time spent: 45  Alanmichael Barmore [facility] Pager  [phone]  If 7PM-7AM, please contact night-coverage [email or web address] Password [facility] 10/06/2012, 8:57 PM

## 2012-10-06 NOTE — ED Notes (Signed)
Patient with nausea/vomiting x 4 days, patient seen for same with no improvements over past few days

## 2012-10-06 NOTE — ED Provider Notes (Signed)
Patient continues to have persistent vomiting despite IV fluids and anti-emetic medications.  Discussed with hospitalist, who will admit.  Norman Herrlich, NP 10/06/12 1909

## 2012-10-06 NOTE — ED Provider Notes (Signed)
Medical screening examination/treatment/procedure(s) were conducted as a shared visit with non-physician practitioner(s) and myself.  I personally evaluated the patient during the encounter   Charles B. Karle Starch, MD 10/06/12 1535

## 2012-10-06 NOTE — ED Provider Notes (Signed)
History     CSN: [id number]  Arrival date & time 10/06/12  9562   First MD Initiated Contact with Patient 10/06/12 0801      Chief Complaint  Patient presents with  . Emesis    (Consider location/radiation/quality/duration/timing/severity/associated sxs/prior treatment) HPI Pt with history of Crohn's reports she has been on Clomid for about 6 months as treatment for infertility. During that time she has had frequent UTI and episodes of intractable vomiting around the time of ovulation. She has had about 4 days of persistent nausea and vomiting as well as loose stools although the diarrhea is chronic for her due to her prior colectomy. She was seen at [facility] hospital for same 2 days ago, was kept for about 8 hours and given 3 bags of fluid before discharge. Labs and US done then were unremarkable. She has been on antibiotics frequently due to UTI currently on a course of Keflex. She denies any abdominal pain, no fever, no dysuria. No CP/SOB.   Past Medical History  Diagnosis Date  . Crohn disease 2000  . Asthma   . Endometriosis   . Cat allergies   . Dysautonomia     recurrent syncoe s/p ILR by Dr Doreatha Lew  . Palpitations     Past Surgical History  Procedure Date  . Ileocolectomy   . Loop recorder implant 12/10    by DR Doreatha Lew  . Knee surgery 1990    1988  . Cesarean section     2011, emergency  CS due to placental abruption  . Colon surgery 2002    partial removed due to crohns  . Appendectomy   . Tonsilectomy, adenoidectomy, bilateral myringotomy and tubes   . US echocardiography 11/11/2009    EF 55-60%    Family History  Problem Relation Age of Onset  . Hypertension Mother   . Hyperlipidemia Mother   . Arthritis Mother   . Diabetes Father   . Coronary artery disease Father   . Heart disease Father 73    MI age 19  . Hyperlipidemia Brother   . Hypertension Brother     History  Substance Use Topics  . Smoking status: Never Smoker   . Smokeless tobacco:  Never Used  . Alcohol Use: Yes     occasionally    OB History    Grav Para Term Preterm Abortions TAB SAB Ect Mult Living   1 1 0 1 0 0 0 0 0 1       Review of Systems All other systems reviewed and are negative except as noted in HPI.   Allergies  Sulfonamide derivatives; Clarithromycin; Macrobid; Codeine; Hydrocodone; and Promethazine hcl  Home Medications   Current Outpatient Rx  Name Route Sig Dispense Refill  . ACETAMINOPHEN 500 MG PO TABS Oral Take 1,000 mg by mouth every 6 (six) hours as needed. For pain    . ALPRAZOLAM 0.25 MG PO TABS Oral Take 0.25-0.5 mg by mouth daily as needed. For anxiety    . BUTALBITAL-APAP-CAFFEINE 50-325-40 MG PO TABS Oral Take 1 tablet by mouth 2 (two) times daily as needed. For migraines    . CEPHALEXIN 250 MG PO TABS Oral Take 1 tablet (250 mg total) by mouth 3 (three) times daily. 30 tablet 0  . CETIRIZINE HCL 10 MG PO TABS Oral Take 10 mg by mouth daily.    . DEXLANSOPRAZOLE 60 MG PO CPDR Oral Take 60 mg by mouth daily.    Marland Kitchen ONDANSETRON HCL 8 MG PO TABS Oral  Take 8 mg by mouth 2 (two) times daily as needed. As needed for nausea/vomiting.    Marland Kitchen PRENATAL VITAMINS (DIS) PO TABS Oral Take 1 tablet by mouth daily.      . SERTRALINE HCL 25 MG PO TABS Oral Take 25-50 mg by mouth daily.     Marland Kitchen ZOLPIDEM TARTRATE 10 MG PO TABS Oral Take 1 tablet (10 mg total) by mouth at bedtime. Scheduled. 30 tablet 1  . CLOMIPHENE CITRATE 50 MG PO TABS Oral Take 150 mg by mouth daily.       BP 130/96  Pulse 84  Resp 20  SpO2 99%  LMP 09/18/2012  Physical Exam  Nursing note and vitals reviewed. Constitutional: She is oriented to person, place, and time. She appears well-developed and well-nourished.  HENT:  Head: Normocephalic and atraumatic.  Eyes: EOM are normal. Pupils are equal, round, and reactive to light.  Neck: Normal range of motion. Neck supple.  Cardiovascular: Normal rate, normal heart sounds and intact distal pulses.   Pulmonary/Chest: Effort  normal and breath sounds normal.  Abdominal: Bowel sounds are normal. She exhibits no distension. There is no tenderness. There is no rebound and no guarding.  Musculoskeletal: Normal range of motion. She exhibits no edema and no tenderness.  Neurological: She is alert and oriented to person, place, and time. She has normal strength. No cranial nerve deficit or sensory deficit.  Skin: Skin is warm and dry. No rash noted.  Psychiatric: She has a normal mood and affect.    ED Course  Procedures (including critical care time)   Labs Reviewed  CBC WITH DIFFERENTIAL  COMPREHENSIVE METABOLIC PANEL  LIPASE, BLOOD  URINALYSIS, ROUTINE W REFLEX MICROSCOPIC  URINE CULTURE  PREGNANCY, URINE  CLOSTRIDIUM DIFFICILE BY PCR   US Transvaginal Non-ob  10/04/2012  *RADIOLOGY REPORT*  Clinical Data: Pelvic pain. Nausea and vomiting.  Endometriosis. Crohn's disease.  On Clomid. LMP 09/19/2012.  TRANSABDOMINAL AND TRANSVAGINAL ULTRASOUND OF PELVIS  Technique:  Both transabdominal and transvaginal ultrasound examinations of the pelvis were performed.  Transabdominal technique was performed for global imaging of the pelvis including uterus, ovaries, adnexal regions, and pelvic cul-de-sac.  It was necessary to proceed with endovaginal exam following the transabdominal exam to visualize the endometrium and left ovarian cysts.  Comparison:  CT on 05/15/2012  Findings: Uterus:  9.2 x 4.8 x 5.5 cm.  Heterogeneous uterine myometrium noted, but no discrete fibroids identified.  Endometrium: Double layer thickness measures 6 mm transvaginally. No focal lesion visualized.  Right ovary: 5.0 x 1.6 x 3.0 cm.  Normal appearance.  Small less than 10 cm follicles noted.  Left ovary: 5.4 x 3.8 x 3.7 cm.  Several follicles seen, three which are greater than 10 mm.  Largest of these measures 3.2 cm in maximum diameter.  No complex cystic or solid masses identified.  Other Findings:  No free fluid  IMPRESSION:  1.  Three >10 mm left  ovarian follicles, largest measuring 3.2 cm. 2.  Unremarkable appearance of the right ovary and uterus.   Original Report Authenticated By: Marlaine Hind, M.D.    US Pelvis Complete  10/04/2012  *RADIOLOGY REPORT*  Clinical Data: Pelvic pain. Nausea and vomiting.  Endometriosis. Crohn's disease.  On Clomid. LMP 09/19/2012.  TRANSABDOMINAL AND TRANSVAGINAL ULTRASOUND OF PELVIS  Technique:  Both transabdominal and transvaginal ultrasound examinations of the pelvis were performed.  Transabdominal technique was performed for global imaging of the pelvis including uterus, ovaries, adnexal regions, and pelvic cul-de-sac.  It  was necessary to proceed with endovaginal exam following the transabdominal exam to visualize the endometrium and left ovarian cysts.  Comparison:  CT on 05/15/2012  Findings: Uterus:  9.2 x 4.8 x 5.5 cm.  Heterogeneous uterine myometrium noted, but no discrete fibroids identified.  Endometrium: Double layer thickness measures 6 mm transvaginally. No focal lesion visualized.  Right ovary: 5.0 x 1.6 x 3.0 cm.  Normal appearance.  Small less than 10 cm follicles noted.  Left ovary: 5.4 x 3.8 x 3.7 cm.  Several follicles seen, three which are greater than 10 mm.  Largest of these measures 3.2 cm in maximum diameter.  No complex cystic or solid masses identified.  Other Findings:  No free fluid  IMPRESSION:  1.  Three >10 mm left ovarian follicles, largest measuring 3.2 cm. 2.  Unremarkable appearance of the right ovary and uterus.   Original Report Authenticated By: Marlaine Hind, M.D.      No diagnosis found.    MDM  [name] [facility] as [facility]. Will recheck labs, IVF, antiemetics including Ativan. Move to CDU for dehydration protocol. Discussed with Rona Ravens, [place].         Menelik Mcfarren B. Karle Starch, MD 10/06/12 [id number]

## 2012-10-06 NOTE — ED Provider Notes (Signed)
Medical screening examination/treatment/procedure(s) were performed by non-physician practitioner and as supervising physician I was immediately available for consultation/collaboration.  Rhunette Croft, M.D.     Rhunette Croft, MD 10/06/12 2356

## 2012-10-06 NOTE — ED Notes (Signed)
np in to to speak with pt and spouse about plan of care.

## 2012-10-06 NOTE — ED Provider Notes (Signed)
Received patient report from Dr. Mali Sheldon. Patient to CDU under dehydration protocol. Patient is a 36 year old female currently undergoing infertility treatment with Clomid. She has been having persistent nausea and vomiting for the past 4 days. She has been seen and evaluated at [facility] for the same complaint, was given 3 L of normal saline and was subsequently discharge. However her symptoms persist. She has had recent pelvic ultrasound at [facility] that was unremarkable. The plan is to continue IV hydration. If patient shows no improvement after 12 hours, consider admission for further management.  9:43 AM Reevaluation patient appears in no acute distress. S1-S2, normal rate and rhythm, no murmurs rubs or gallops. Lungs clear to auscultations bilaterally. Abdomen is soft, mild tenderness to right upper quadrant and epigastric region no rebound or guarding. Negative Murphy's sign. Intact distal pulses.  3:23 PM Report given to oncoming CDU provider, who will continue pt care.  BP 100/58  Pulse 78  Temp 99 F (37.2 C) (Oral)  Resp 15  SpO2 99%  LMP 09/18/2012  Nursing notes reviewed and considered in documentation  Previous records reviewed and considered  All labs/vitals reviewed and considered  xrays reviewed and considered  Results for orders placed during the hospital encounter of 10/06/12  CBC WITH DIFFERENTIAL      Component Value Range   WBC 10.1  4.0 - 10.5 K/uL   RBC 4.77  3.87 - 5.11 MIL/uL   Hemoglobin 14.1  12.0 - 15.0 g/dL   HCT 41.7  36.0 - 46.0 %   MCV 87.4  78.0 - 100.0 fL   MCH 29.6  26.0 - 34.0 pg   MCHC 33.8  30.0 - 36.0 g/dL   RDW 12.5  11.5 - 15.5 %   Platelets 337  150 - 400 K/uL   Neutrophils Relative 75  43 - 77 %   Neutro Abs 7.6  1.7 - 7.7 K/uL   Lymphocytes Relative 20  12 - 46 %   Lymphs Abs 2.0  0.7 - 4.0 K/uL   Monocytes Relative 5  3 - 12 %   Monocytes Absolute 0.5  0.1 - 1.0 K/uL   Eosinophils Relative 0  0 - 5 %   Eosinophils  Absolute 0.0  0.0 - 0.7 K/uL   Basophils Relative 0  0 - 1 %   Basophils Absolute 0.0  0.0 - 0.1 K/uL  COMPREHENSIVE METABOLIC PANEL      Component Value Range   Sodium 139  135 - 145 mEq/L   Potassium 3.6  3.5 - 5.1 mEq/L   Chloride 103  96 - 112 mEq/L   CO2 24  19 - 32 mEq/L   Glucose, Bld 109 (*) 70 - 99 mg/dL   BUN 5 (*) 6 - 23 mg/dL   Creatinine, Ser 0.69  0.50 - 1.10 mg/dL   Calcium 9.0  8.4 - 10.5 mg/dL   Total Protein 7.6  6.0 - 8.3 g/dL   Albumin 3.9  3.5 - 5.2 g/dL   AST 67 (*) 0 - 37 U/L   ALT 79 (*) 0 - 35 U/L   Alkaline Phosphatase 78  39 - 117 U/L   Total Bilirubin 0.3  0.3 - 1.2 mg/dL   GFR calc non Af Amer >90  >90 mL/min   GFR calc Af Amer >90  >90 mL/min  LIPASE, BLOOD      Component Value Range   Lipase 25  11 - 59 U/L  URINALYSIS, ROUTINE W REFLEX MICROSCOPIC  Component Value Range   Color, Urine YELLOW  YELLOW   APPearance CLOUDY (*) CLEAR   Specific Gravity, Urine 1.016  1.005 - 1.030   pH 6.5  5.0 - 8.0   Glucose, UA NEGATIVE  NEGATIVE mg/dL   Hgb urine dipstick NEGATIVE  NEGATIVE   Bilirubin Urine NEGATIVE  NEGATIVE   Ketones, ur >80 (*) NEGATIVE mg/dL   Protein, ur NEGATIVE  NEGATIVE mg/dL   Urobilinogen, UA 0.2  0.0 - 1.0 mg/dL   Nitrite NEGATIVE  NEGATIVE   Leukocytes, UA SMALL (*) NEGATIVE  PREGNANCY, URINE      Component Value Range   Preg Test, Ur NEGATIVE  NEGATIVE  CLOSTRIDIUM DIFFICILE BY PCR      Component Value Range   C difficile by pcr NEGATIVE  NEGATIVE  URINE MICROSCOPIC-ADD ON      Component Value Range   Squamous Epithelial / LPF MANY (*) RARE   WBC, UA 3-6  <3 WBC/hpf   Urine-Other FEW YEAST     US Transvaginal Non-ob  10/04/2012  *RADIOLOGY REPORT*  Clinical Data: Pelvic pain. Nausea and vomiting.  Endometriosis. Crohn's disease.  On Clomid. LMP 09/19/2012.  TRANSABDOMINAL AND TRANSVAGINAL ULTRASOUND OF PELVIS  Technique:  Both transabdominal and transvaginal ultrasound examinations of the pelvis were performed.   Transabdominal technique was performed for global imaging of the pelvis including uterus, ovaries, adnexal regions, and pelvic cul-de-sac.  It was necessary to proceed with endovaginal exam following the transabdominal exam to visualize the endometrium and left ovarian cysts.  Comparison:  CT on 05/15/2012  Findings: Uterus:  9.2 x 4.8 x 5.5 cm.  Heterogeneous uterine myometrium noted, but no discrete fibroids identified.  Endometrium: Double layer thickness measures 6 mm transvaginally. No focal lesion visualized.  Right ovary: 5.0 x 1.6 x 3.0 cm.  Normal appearance.  Small less than 10 cm follicles noted.  Left ovary: 5.4 x 3.8 x 3.7 cm.  Several follicles seen, three which are greater than 10 mm.  Largest of these measures 3.2 cm in maximum diameter.  No complex cystic or solid masses identified.  Other Findings:  No free fluid  IMPRESSION:  1.  Three >10 mm left ovarian follicles, largest measuring 3.2 cm. 2.  Unremarkable appearance of the right ovary and uterus.   Original Report Authenticated By: Marlaine Hind, M.D.    US Pelvis Complete  10/04/2012  *RADIOLOGY REPORT*  Clinical Data: Pelvic pain. Nausea and vomiting.  Endometriosis. Crohn's disease.  On Clomid. LMP 09/19/2012.  TRANSABDOMINAL AND TRANSVAGINAL ULTRASOUND OF PELVIS  Technique:  Both transabdominal and transvaginal ultrasound examinations of the pelvis were performed.  Transabdominal technique was performed for global imaging of the pelvis including uterus, ovaries, adnexal regions, and pelvic cul-de-sac.  It was necessary to proceed with endovaginal exam following the transabdominal exam to visualize the endometrium and left ovarian cysts.  Comparison:  CT on 05/15/2012  Findings: Uterus:  9.2 x 4.8 x 5.5 cm.  Heterogeneous uterine myometrium noted, but no discrete fibroids identified.  Endometrium: Double layer thickness measures 6 mm transvaginally. No focal lesion visualized.  Right ovary: 5.0 x 1.6 x 3.0 cm.  Normal appearance.  Small  less than 10 cm follicles noted.  Left ovary: 5.4 x 3.8 x 3.7 cm.  Several follicles seen, three which are greater than 10 mm.  Largest of these measures 3.2 cm in maximum diameter.  No complex cystic or solid masses identified.  Other Findings:  No free fluid  IMPRESSION:  1.  Three >10 mm left ovarian follicles, largest measuring 3.2 cm. 2.  Unremarkable appearance of the right ovary and uterus.   Original Report Authenticated By: Marlaine Hind, M.D.       Domenic Moras, PA-C 10/06/12 1525

## 2012-10-07 ENCOUNTER — Observation Stay (HOSPITAL_COMMUNITY): Payer: PRIVATE HEALTH INSURANCE

## 2012-10-07 DIAGNOSIS — J45909 Unspecified asthma, uncomplicated: Secondary | ICD-10-CM

## 2012-10-07 DIAGNOSIS — R1115 Cyclical vomiting syndrome unrelated to migraine: Secondary | ICD-10-CM

## 2012-10-07 DIAGNOSIS — R112 Nausea with vomiting, unspecified: Secondary | ICD-10-CM

## 2012-10-07 LAB — COMPREHENSIVE METABOLIC PANEL
AST: 48 U/L — ABNORMAL HIGH (ref 0–37)
Albumin: 2.8 g/dL — ABNORMAL LOW (ref 3.5–5.2)
Alkaline Phosphatase: 56 U/L (ref 39–117)
BUN: <3 mg/dL — ABNORMAL LOW (ref 6–23)
Creatinine, Ser: 0.66 mg/dL (ref 0.50–1.10)
Potassium: 3.3 mEq/L — ABNORMAL LOW (ref 3.5–5.1)
Total Protein: 5.5 g/dL — ABNORMAL LOW (ref 6.0–8.3)

## 2012-10-07 LAB — CBC
HCT: 32.5 % — ABNORMAL LOW (ref 36.0–46.0)
MCH: 29.4 pg (ref 26.0–34.0)
MCV: 86 fL (ref 78.0–100.0)
RBC: 3.78 MIL/uL — ABNORMAL LOW (ref 3.87–5.11)
WBC: 8.3 10*3/uL (ref 4.0–10.5)

## 2012-10-07 LAB — URINE CULTURE
Colony Count: NO GROWTH
Culture: NO GROWTH

## 2012-10-07 MED ORDER — IOHEXOL 300 MG/ML  SOLN: 20.0000 mL | INTRAMUSCULAR | Status: AC

## 2012-10-07 MED ORDER — POTASSIUM CHLORIDE 10 MEQ/100ML IV SOLN
10.0000 meq | INTRAVENOUS | Status: AC
  Administered 2012-10-07 (×2): 10 meq via INTRAVENOUS
  Filled 2012-10-07 (×2): qty 100

## 2012-10-07 MED ORDER — IOHEXOL 300 MG/ML  SOLN
80.0000 mL | Freq: Once | INTRAMUSCULAR | Status: AC | PRN
  Administered 2012-10-07: 80 mL via INTRAVENOUS

## 2012-10-07 NOTE — Progress Notes (Signed)
[facility] PROGRESS NOTE  TRULA FREDE OZY:[id number] DOB: [birth year] DOA: 10/06/2012 PCP: Eliezer Lofts, MD  Assessment/Plan: Intractable nausea vomiting: hx of same. Not much improvement since admission last evening. Probably viral but pt with hx crohn's Ultra sound of pelvis done 10/04/12 yields Three >10 mm left ovarian follicles, largest measuring 3.2 cm.  Unremarkable appearance of the right ovary and uterus. C diff negative. Lipase 25. Urine with small leuks and cloudy and >80 keytones. Urine culture no growth.  Will check CT.  Continue scheduled zofran with compazine and ativan for break through. Not tolerating clear liquid.   Abnormal UA with small LE, 3-6 WBC, and few yeast. Many squamous cells.  - Urine culture no growth   Migraine HA, currently stable and asymptomatic  - Fioricet prn   Mood d/o/ anxiety: Uncontrolled  - Hold sertraline for now b/c of common side effect of nausea and vomiting, but strongly encourage titration to maximum dose  - Minimize use of addictive benzodiazepines if possible  Code Status: full Family Communication: pt at bedside Disposition Plan: home when medically stable   Consultants:  none  Procedures:  none  Antibiotics:  none  HPI/Subjective: Awake, alert, somewhat ill appearing. NAD  Objective: Filed Vitals:   10/06/12 1911 10/06/12 2031 10/07/12 0642 10/07/12 0809  BP: 116/63 113/78 114/72   Pulse: 86 76 86   Temp:  98.7 F (37.1 C) 98.6 F (37 C)   TempSrc:  Oral Oral   Resp: 18 16 16 16   Height:  5' 4"  (1.626 m)    Weight:  69.5 kg (153 lb 3.5 oz)    SpO2: 100% 100% 99%     Intake/Output Summary (Last 24 hours) at 10/07/12 1033 Last data filed at 10/07/12 0809  Gross per 24 hour  Intake 303.33 ml  Output      1 ml  Net 302.33 ml   Filed Weights   10/06/12 2031  Weight: 69.5 kg (153 lb 3.5 oz)    Exam:   General:  Awake alert oriented x3  Cardiovascular: RRR No MGR, No LEE  Respiratory: normal  effort BSCTAB No wheeze, rhonchi  Abdomen: soft hyperactive BS particularly lower quadrants. Mild tenderness to palpation throughout.   Data Reviewed: Basic Metabolic Panel:  Lab 04/88/89 0610 10/06/12 0834 10/04/12 1440  NA 139 139 137  K 3.3* 3.6 4.0  CL 109 103 102  CO2 22 24 22   GLUCOSE 113* 109* 76  BUN <3* 5* 9  CREATININE 0.66 0.69 0.67  CALCIUM 7.9* 9.0 8.5  MG -- -- --  PHOS -- -- --   Liver Function Tests:  Lab 10/07/12 0610 10/06/12 0834 10/04/12 1440  AST 48* 67* 28  ALT 71* 79* 26  ALKPHOS 56 78 66  BILITOT 0.3 0.3 0.2*  PROT 5.5* 7.6 6.3  ALBUMIN 2.8* 3.9 3.4*    Lab 10/06/12 0834  LIPASE 25  AMYLASE --   No results found for this basename: AMMONIA:5 in the last 168 hours CBC:  Lab 10/07/12 0610 10/06/12 0834 10/04/12 1440  WBC 8.3 10.1 10.1  NEUTROABS -- 7.6 8.0*  HGB 11.1* 14.1 11.9*  HCT 32.5* 41.7 36.3  MCV 86.0 87.4 87.7  PLT 245 337 283   Cardiac Enzymes: No results found for this basename: CKTOTAL:5,CKMB:5,CKMBINDEX:5,TROPONINI:5 in the last 168 hours BNP (last 3 results) No results found for this basename: PROBNP:3 in the last 8760 hours CBG:  Lab 10/07/12 0800  GLUCAP 100*    Recent Results (from the  past 240 hour(s))  URINE CULTURE     Status: Normal   Collection Time   10/06/12  8:32 AM      Component Value Range Status Comment   Specimen Description URINE, CLEAN CATCH   Final    Special Requests NONE   Final    Culture  Setup Time 10/06/2012 09:50   Final    Colony Count NO GROWTH   Final    Culture NO GROWTH   Final    Report Status 10/07/2012 FINAL   Final   CLOSTRIDIUM DIFFICILE BY PCR     Status: Normal   Collection Time   10/06/12  8:32 AM      Component Value Range Status Comment   C difficile by pcr NEGATIVE  NEGATIVE Final      Studies: No results found.  Scheduled Meds:   . sodium chloride  1,000 mL Intravenous Once   Followed by  . sodium chloride  1,000 mL Intravenous Once  . iohexol  20 mL Oral Q1  Hr x 2  . LORazepam  1 mg Intravenous Once  . ondansetron (ZOFRAN) IV  8 mg Intravenous Q6H  . ondansetron  4 mg Intravenous Once  . pantoprazole (PROTONIX) IV  40 mg Intravenous Q12H  . pneumococcal 23 valent vaccine  0.5 mL Intramuscular Tomorrow-1000  . prenatal vitamin w/FE, FA  1 tablet Oral Daily  . sodium chloride  1,000 mL Intravenous Once  . zolpidem  10 mg Oral QHS  . DISCONTD: Prenatal Vitamins  1 tablet Oral Daily  . DISCONTD: Prenatal Vitamins  1 tablet Oral Daily   Continuous Infusions:   . dextrose 5 % and 0.45 % NaCl with KCl 10 mEq/L 125 mL/hr at 10/07/12 0805  . DISCONTD: sodium chloride 1,000 mL (10/06/12 1122)    Active Problems:  Crohn's disease  Dysautomnia  GERD (gastroesophageal reflux disease)  Migraine  Cystitis  Nausea & vomiting  Diarrhea  Anxiety    Time spent: 35 minutes    [facility]  If 8PM-8AM, please contact night-coverage at [email or web address], password [facility] 10/07/2012, 10:33 AM  LOS: 1 day

## 2012-10-07 NOTE — Progress Notes (Signed)
INITIAL ADULT NUTRITION ASSESSMENT Date: 10/07/2012   Time: 3:24 PM Reason for Assessment: MST (Malnutrition Screening Tool)  INTERVENTION: 1. No interventions at this time 2. RD will follow    DOCUMENTATION CODES Per approved criteria  -Not Applicable     ASSESSMENT: Female 36 y.o.  Dx: Nausea and Vomiting  Hx:  Past Medical History  Diagnosis Date  . Crohn disease 2000    remission  . Asthma     no intubations or ICU stays  . Endometriosis   . Cat allergies   . Dysautonomia     recurrent syncope s/p ILR by Dr Doreatha Lew  . Palpitations   . History of recurrent UTIs   . Chronic nausea     around ovulation time    Past Surgical History  Procedure Date  . Ileocolectomy   . Loop recorder implant 12/10    by DR Doreatha Lew  . Knee surgery 1990    1988  . Cesarean section     2011, emergency  CS due to placental abruption  . Colon surgery 2002    partial removed due to crohns  . Appendectomy   . Tonsilectomy, adenoidectomy, bilateral myringotomy and tubes   . US echocardiography 11/11/2009    EF 55-60%    Related Meds:     . iohexol  20 mL Oral Q1 Hr x 2  . LORazepam  1 mg Intravenous Once  . ondansetron (ZOFRAN) IV  8 mg Intravenous Q6H  . pantoprazole (PROTONIX) IV  40 mg Intravenous Q12H  . pneumococcal 23 valent vaccine  0.5 mL Intramuscular Tomorrow-1000  . potassium chloride  10 mEq Intravenous Q1 Hr x 2  . prenatal vitamin w/FE, FA  1 tablet Oral Daily  . sodium chloride  1,000 mL Intravenous Once  . zolpidem  10 mg Oral QHS  . DISCONTD: Prenatal Vitamins  1 tablet Oral Daily  . DISCONTD: Prenatal Vitamins  1 tablet Oral Daily     Ht: 5' 4"  (162.6 cm)  Wt: 153 lb 3.5 oz (69.5 kg)  Ideal Wt: 54.5 kg  % Ideal Wt: 128 %  Usual Wt:  Wt Readings from Last 5 Encounters:  10/06/12 153 lb 3.5 oz (69.5 kg)  10/04/12 145 lb 6.4 oz (65.953 kg)  10/02/12 147 lb (66.679 kg)  07/31/12 155 lb (70.308 kg)  07/05/12 157 lb 12 oz (71.555 kg)    % Usual  Wt: 99%  Body mass index is 26.30 kg/(m^2). Pt is overweight per current BMI   Food/Nutrition Related Hx: Pt with unsure for weight loss in current BMI, frequent vomiting PTA  Labs:  CMP     Component Value Date/Time   NA 139 10/07/2012 0610   K 3.3* 10/07/2012 0610   CL 109 10/07/2012 0610   CO2 22 10/07/2012 0610   GLUCOSE 113* 10/07/2012 0610   BUN <3* 10/07/2012 0610   CREATININE 0.66 10/07/2012 0610   CALCIUM 7.9* 10/07/2012 0610   PROT 5.5* 10/07/2012 0610   ALBUMIN 2.8* 10/07/2012 0610   AST 48* 10/07/2012 0610   ALT 71* 10/07/2012 0610   ALKPHOS 56 10/07/2012 0610   BILITOT 0.3 10/07/2012 0610   GFRNONAA >90 10/07/2012 0610   GFRAA >90 10/07/2012 0610      Intake/Output Summary (Last 24 hours) at 10/07/12 1527 Last data filed at 10/07/12 1300  Gross per 24 hour  Intake 303.33 ml  Output      2 ml  Net 301.33 ml     Diet Order: Clear  Liquid  Supplements/Tube Feeding: none   IVF:    dextrose 5 % and 0.45 % NaCl with KCl 10 mEq/L Last Rate: 125 mL/hr at 10/07/12 6286  DISCONTD: sodium chloride Last Rate: 1,000 mL (10/06/12 1122)    Estimated Nutritional Needs:   Kcal:1700-1900   Protein: 55-65 gm  Fluid:  1.7-1.9 L   Pt admitted with N/V since last Thursday.  Spoke with pt's husband, states the pt has been not eating well-unable to really keep anything down for about 5 days. Per weight hx, pt does not have any weight loss. Expect pt to eat well once diet is advanced and N/V is controlled.   NUTRITION DIAGNOSIS: -Inadequate oral intake (NI-2.1).  Status: Ongoing  RELATED TO: N/V  AS EVIDENCE BY: pt/family report   MONITORING/EVALUATION(Goals): Goal: Diet advance to meet >90% of estimated nutrition needs  Monitor: PO intake, weight, labs  EDUCATION NEEDS: -No education needs identified at this time   Orson Slick RD, LDN Pager [phone] After Hours pager [phone]  10/07/2012, 3:24 PM

## 2012-10-07 NOTE — Progress Notes (Signed)
Patient seen and examined by me.  Agree with plan.  Will try to get CT scan.  Patient says she has had this uncontrollable vomiting before but does not remember what happened.  ? Viral vs psych.  Eulogio Bear DO

## 2012-10-07 NOTE — Care Management Note (Signed)
    Page 1 of 1   10/13/2012     5:49:23 PM   CARE MANAGEMENT NOTE 10/13/2012  Patient:  EDIN, SKARDA   Account Number:  [id number]  Date Initiated:  10/07/2012  Documentation initiated by:  Tomi Bamberger  Subjective/Objective Assessment:   dx n/v  admit- lives with spouse, pta independent.     Action/Plan:   Anticipated DC Date:  10/11/2012   Anticipated DC Plan:  [place]  CM consult      Choice offered to / List presented to:             Status of service:  Completed, signed off Medicare Important Message given?   (If response is "NO", the following Medicare IM given date fields will be blank) Date Medicare IM given:   Date Additional Medicare IM given:    Discharge Disposition:  HOME/SELF CARE  Per UR Regulation:  Reviewed for med. necessity/level of care/duration of stay  If discussed at [place] of Stay Meetings, dates discussed:    Comments:  10/13/12 17:48 Tomi Bamberger RN, BSN [phone] pt dc to home.  10/07/12 15:53 Tomi Bamberger RN, BSN [phone] patient lives with spouse, pta independent.  Patient has medication coverage and transportation.  NCM will continue to follow for dc needs.

## 2012-10-08 DIAGNOSIS — G43909 Migraine, unspecified, not intractable, without status migrainosus: Secondary | ICD-10-CM

## 2012-10-08 DIAGNOSIS — K5090 Crohn's disease, unspecified, without complications: Secondary | ICD-10-CM

## 2012-10-08 DIAGNOSIS — R197 Diarrhea, unspecified: Secondary | ICD-10-CM

## 2012-10-08 LAB — CBC
Hemoglobin: 13.5 g/dL (ref 12.0–15.0)
MCH: 28.9 pg (ref 26.0–34.0)
MCV: 86.1 fL (ref 78.0–100.0)
RBC: 4.67 MIL/uL (ref 3.87–5.11)

## 2012-10-08 LAB — BASIC METABOLIC PANEL
BUN: <3 mg/dL — ABNORMAL LOW (ref 6–23)
CO2: 21 mEq/L (ref 19–32)
Calcium: 8.7 mg/dL (ref 8.4–10.5)
Creatinine, Ser: 0.68 mg/dL (ref 0.50–1.10)
Glucose, Bld: 130 mg/dL — ABNORMAL HIGH (ref 70–99)

## 2012-10-08 MED ORDER — METRONIDAZOLE IN NACL 5-0.79 MG/ML-% IV SOLN
500.0000 mg | Freq: Three times a day (TID) | INTRAVENOUS | Status: DC
  Administered 2012-10-08 – 2012-10-11 (×9): 500 mg via INTRAVENOUS
  Filled 2012-10-08 (×12): qty 100

## 2012-10-08 MED ORDER — CIPROFLOXACIN IN D5W 400 MG/200ML IV SOLN
400.0000 mg | Freq: Two times a day (BID) | INTRAVENOUS | Status: DC
  Administered 2012-10-08 – 2012-10-11 (×6): 400 mg via INTRAVENOUS
  Filled 2012-10-08 (×8): qty 200

## 2012-10-08 MED ORDER — PRENATAL MULTIVITAMIN CH
1.0000 | ORAL_TABLET | Freq: Every day | ORAL | Status: DC
  Filled 2012-10-08 (×4): qty 1

## 2012-10-08 NOTE — Consult Note (Signed)
Subjective:   HPI  The patient is a 36 year old female with a history of Crohn's disease. She has had surgery with removal of terminal ileum and cecum in the past. She used to be on 6-MP but about 5 months ago stopped that therapy with the hopes of becoming pregnant. She also has a history of reflux esophagitis. She has not been on any Crohn's medications for 5 months. She has been on PPI therapy for esophagitis. On October 10 she went to her primary care doctor and she wasn't able to keep her drink. She thought she might have a urinary tract infection and was given an injection of Rocephin. She was having some problems with vomiting. She has continued to have problems with nausea and vomiting since October 10. She has also had some lower abdominal discomfort. Because of ongoing vomiting and nausea she was admitted to the hospital. A CT scan of the abdomen and pelvis was done which showed multiple areas of mild colon wall thickening compatible with colitis. Clostridium difficile PCR was negative. It is still unclear whether there could be some other underlying enteric pathogen however. Because of this Cipro and Flagyl have been started today.  Review of Systems No chest pain or shortness of breath  Past Medical History  Diagnosis Date  . Crohn disease 2000    remission  . Asthma     no intubations or ICU stays  . Endometriosis   . Cat allergies   . Dysautonomia     recurrent syncope s/p ILR by Dr Doreatha Lew  . Palpitations   . History of recurrent UTIs   . Chronic nausea     around ovulation time   Past Surgical History  Procedure Date  . Ileocolectomy   . Loop recorder implant 12/10    by DR Doreatha Lew  . Knee surgery 1990    1988  . Cesarean section     2011, emergency  CS due to placental abruption  . Colon surgery 2002    partial removed due to crohns  . Appendectomy   . Tonsilectomy, adenoidectomy, bilateral myringotomy and tubes   . US echocardiography 11/11/2009    EF 55-60%    History   Social History  . Marital Status: Married    Spouse Name: N/A    Number of Children: N/A  . Years of Education: N/A   Occupational History  .  Other    Airline pilot   Social History Main Topics  . Smoking status: Never Smoker   . Smokeless tobacco: Never Used  . Alcohol Use: Yes     occasionally  . Drug Use: No  . Sexually Active: Yes   Other Topics Concern  . Not on file   Social History Narrative  . No narrative on file   family history includes Arthritis in her mother; Coronary artery disease in her father; Diabetes in her father; Heart disease (age of onset:49) in her father; Hyperlipidemia in her brother and mother; and Hypertension in her brother and mother. Current facility-administered medications:ALPRAZolam (XANAX) tablet 0.25 mg, 0.25 mg, Oral, TID PRN, Janece Canterbury, MD;  butalbital-acetaminophen-caffeine (FIORICET, ESGIC) 50-325-40 MG per tablet 1 tablet, 1 tablet, Oral, BID PRN, Janece Canterbury, MD;  ciprofloxacin (CIPRO) IVPB 400 mg, 400 mg, Intravenous, Q12H, Adeline C Viyuoh, MD, 400 mg at 10/08/12 1404 dextrose 5 % and 0.45 % NaCl with KCl 10 mEq/L infusion, , Intravenous, Continuous, Janece Canterbury, MD, Last Rate: 125 mL/hr at 10/08/12 0332;  LORazepam (ATIVAN) injection 0.5 mg,  0.5 mg, Intravenous, Q6H PRN, Janece Canterbury, MD;  metroNIDAZOLE (FLAGYL) IVPB 500 mg, 500 mg, Intravenous, Q8H, Adeline C Viyuoh, MD, 500 mg at 10/08/12 1403;  ondansetron (ZOFRAN) 8 mg/NS 50 ml IVPB, 8 mg, Intravenous, Q6H, Janece Canterbury, MD, 8 mg at 10/08/12 1159 pantoprazole (PROTONIX) injection 40 mg, 40 mg, Intravenous, Q12H, Janece Canterbury, MD, 40 mg at 10/08/12 1043;  pneumococcal 23 valent vaccine (PNU-IMMUNE) injection 0.5 mL, 0.5 mL, Intramuscular, Tomorrow-1000, Janece Canterbury, MD, 0.5 mL at 10/08/12 1218;  potassium chloride 10 mEq in 100 mL IVPB, 10 mEq, Intravenous, Q1 Hr x 2, Lezlie Octave Black, NP, 10 mEq at 10/07/12 1346 prenatal multivitamin tablet 1  tablet, 1 tablet, Oral, Daily, Jessica U Vann, DO;  prochlorperazine (COMPAZINE) injection 10 mg, 10 mg, Intravenous, Q6H PRN, Janece Canterbury, MD, 10 mg at 10/08/12 1402;  zolpidem (AMBIEN) tablet 10 mg, 10 mg, Oral, QHS, Janece Canterbury, MD, 10 mg at 10/07/12 2212;  DISCONTD: prenatal vitamin w/FE, FA (NATACHEW) chewable tablet 1 tablet, 1 tablet, Oral, Daily, Janece Canterbury, MD Allergies  Allergen Reactions  . Sulfonamide Derivatives Other (See Comments)    Hematemesis; documented while a young child  . Clarithromycin Other (See Comments)    Reacts with chron's disease  . Macrobid (Nitrofurantoin) Nausea And Vomiting  . Codeine Nausea And Vomiting  . Hydrocodone Nausea And Vomiting  . Promethazine Hcl Other (See Comments)    Muscular seizures     Objective:     BP 119/81  Pulse 89  Temp 98.7 F (37.1 C) (Oral)  Resp 18  Ht 5' 4"  (1.626 m)  Wt 69.5 kg (153 lb 3.5 oz)  BMI 26.30 kg/m2  SpO2 100%  LMP 09/18/2012  Alert and oriented  No acute distress  Heart regular rhythm no murmurs  Lungs clear  Abdomen: Bowel sounds are present, soft, mild tenderness in the lower abdomen bilaterally but no rebound or guarding  Laboratory No components found with this basename: d1      Assessment:     #1. History of Crohn's disease  #2. Nausea and vomiting  #3. Patchy colitis on CT scan. It is unclear at this time whether her current symptoms represent an underlying anterior pathogen causing an infectious colitis or whether this could be reactivation of Crohn's disease.        Plan:     Agree with the use of Cipro and Flagyl for now,  over the next 48 hours to see how she does. If she does not turn the corner and starts to improve I would then consider instituting IV steroid therapy just in case we might be dealing with reactivation of Crohn's.  Lab Results  Component Value Date   HGB 13.5 10/08/2012   HGB 11.1* 10/07/2012   HGB 14.1 10/06/2012   HCT 40.2 10/08/2012    HCT 32.5* 10/07/2012   HCT 41.7 10/06/2012   ALKPHOS 56 10/07/2012   ALKPHOS 78 10/06/2012   ALKPHOS 66 10/04/2012   AST 48* 10/07/2012   AST 67* 10/06/2012   AST 28 10/04/2012   ALT 71* 10/07/2012   ALT 79* 10/06/2012   ALT 26 10/04/2012   AMYLASE 27 02/08/2010

## 2012-10-08 NOTE — Progress Notes (Signed)
[facility] PROGRESS NOTE  [name] EZM:[id number] DOB: 1976-03-27 DOA: 10/06/2012 PCP: Eliezer Lofts, MD  Assessment/Plan: Intractable nausea vomiting: hx of same. -Persisting, CT scan of abdomen and pelvis 10/15 with multiple areas of mild colon wall thickening which may indicate colitis.no surrounding mesenteric inflammation, no abscess or fistula and no bowel structure. Ultra sound of pelvis done 10/04/12 yields Three >10 mm left ovarian follicles, largest measuring 3.2 cm.  Unremarkable appearance of the right ovary and uterus. C diff negative. Lipase 25. Urine with small leuks and cloudy and >80 keytones. Urine culture no growth.  -Will start empiric Cipro and Flagyl -She has a history of Crohn's, and is followed by Dr. Oletta Lamas , I have consulted [facility] for further recommendations -Continue scheduled zofran with compazine and ativan for break through. Not tolerating clear liquid.  -Patient also with mildly elevated LFTs on admit, will recheck and follow.  Migraine HA, currently stable and asymptomatic  - Fioricet prn   Mood d/o/ anxiety: Uncontrolled  -Continue Holding sertraline for now b/c of common side effect of nausea and vomiting, but strongly encourage titration to maximum dose  - Minimize use of addictive benzodiazepines if possible  Code Status: full Family Communication: pt and family at bedside Disposition Plan: home when medically stable   Consultants:  none  Procedures:  none  Antibiotics:  none  HPI/Subjective: Alert and oriented x3, continues to have nausea vomiting. Denies abdominal pain. Admits to diarrhea X1 today.  Objective: Filed Vitals:   10/07/12 0809 10/07/12 1401 10/07/12 2133 10/08/12 0633  BP:  105/71 114/75 119/81  Pulse:  88 84 89  Temp:  99.1 F (37.3 C) 99 F (37.2 C) 98.7 F (37.1 C)  TempSrc:  Oral Oral Oral  Resp: 16 18 18 18   Height:      Weight:      SpO2:  100% 99% 100%    Intake/Output Summary (Last 24  hours) at 10/08/12 1210 Last data filed at 10/08/12 0748  Gross per 24 hour  Intake    120 ml  Output      6 ml  Net    114 ml   Filed Weights   10/06/12 2031  Weight: 69.5 kg (153 lb 3.5 oz)    Exam:   General:  Awake alert oriented x3  Cardiovascular: RRR No MGR,  Respiratory: normal effort BSCTAB No wheeze, rhonchi  Abdomen: soft, bowel sounds present, nontender and nondistended no organomegaly no masses palpable .   Extremities: No cyanosis and no edema  Data Reviewed: Basic Metabolic Panel:  Lab 50/35/46 0635 10/07/12 0610 10/06/12 0834 10/04/12 1440  NA 140 139 139 137  K 3.7 3.3* 3.6 4.0  CL 109 109 103 102  CO2 21 22 24 22   GLUCOSE 130* 113* 109* 76  BUN <3* <3* 5* 9  CREATININE 0.68 0.66 0.69 0.67  CALCIUM 8.7 7.9* 9.0 8.5  MG -- -- -- --  PHOS -- -- -- --   Liver Function Tests:  Lab 10/07/12 0610 10/06/12 0834 10/04/12 1440  AST 48* 67* 28  ALT 71* 79* 26  ALKPHOS 56 78 66  BILITOT 0.3 0.3 0.2*  PROT 5.5* 7.6 6.3  ALBUMIN 2.8* 3.9 3.4*    Lab 10/06/12 0834  LIPASE 25  AMYLASE --   No results found for this basename: AMMONIA:5 in the last 168 hours CBC:  Lab 10/08/12 0635 10/07/12 0610 10/06/12 0834 10/04/12 1440  WBC 10.0 8.3 10.1 10.1  NEUTROABS -- -- 7.6  8.0*  HGB 13.5 11.1* 14.1 11.9*  HCT 40.2 32.5* 41.7 36.3  MCV 86.1 86.0 87.4 87.7  PLT 314 245 337 283   Cardiac Enzymes: No results found for this basename: CKTOTAL:5,CKMB:5,CKMBINDEX:5,TROPONINI:5 in the last 168 hours BNP (last 3 results) No results found for this basename: PROBNP:3 in the last 8760 hours CBG:  Lab 10/08/12 0752 10/07/12 0800  GLUCAP 151* 100*    Recent Results (from the past 240 hour(s))  URINE CULTURE     Status: Normal   Collection Time   10/06/12  8:32 AM      Component Value Range Status Comment   Specimen Description URINE, CLEAN CATCH   Final    Special Requests NONE   Final    Culture  Setup Time 10/06/2012 09:50   Final    Colony Count NO  GROWTH   Final    Culture NO GROWTH   Final    Report Status 10/07/2012 FINAL   Final   CLOSTRIDIUM DIFFICILE BY PCR     Status: Normal   Collection Time   10/06/12  8:32 AM      Component Value Range Status Comment   C difficile by pcr NEGATIVE  NEGATIVE Final      Studies: Ct Abdomen Pelvis W Contrast  10/07/2012  *RADIOLOGY REPORT*  Clinical Data:  CT ABDOMEN AND PELVIS WITH CONTRAST  Technique:  Multidetector CT imaging of the abdomen and pelvis was performed following the standard protocol during bolus administration of intravenous contrast.  Contrast: 79m OMNIPAQUE IOHEXOL 300 MG/ML  SOLN  Comparison: May 15, 2012  Findings:  Lung bases are clear. There is a small hiatal hernia.  There is a 1.4 x 1.3 cm cyst in the lateral segment of the left lobe of the liver.  No other liver lesions are identified.  There is no biliary duct dilatation.  Spleen, pancreas, adrenals appear normal.  Flank kidneys show no mass or hydronephrosis on either side.  In the pelvis, there are two cystic areas in the left ovary which show peripheral enhancement, probably representing collapsing cysts.  Each of these lesions measures approximately 2 x 2 cm in size.  There are no other pelvic masses.  The appendix region appears normal.  There is postoperative change in the past; region, a finding noted previously.  There are diverticula arising from the transverse colon.  No evidence of diverticulitis.  There is mild wall thickening at multiple sites in the colon, a finding may indicate some nonspecific colitis.  There is no mesenteric thickening in these areas.  There is no bowel obstruction.  No free air or portal venous air. There is no ascites, adenopathy, or abscess in the abdomen or pelvis.  No fistula is seen.  Aorta is nonaneurysmal route.  There are no blastic or lytic bone lesions.  IMPRESSION: Postoperative change in the right colon.  Multiple areas of mild colon wall thickening air seen which may indicate  colitis.  There is no surrounding mesenteric inflammation.  No abscess or fistula. No bowel obstruction.  Probable collapsing cysts in the left ovary.  There is a small hiatal hernia.   Original Report Authenticated By: WMargit Hanks WOODRUFF III, M.D.     Scheduled Meds:    . iohexol  20 mL Oral Q1 Hr x 2  . ondansetron (ZOFRAN) IV  8 mg Intravenous Q6H  . pantoprazole (PROTONIX) IV  40 mg Intravenous Q12H  . pneumococcal 23 valent vaccine  0.5 mL Intramuscular Tomorrow-1000  .  potassium chloride  10 mEq Intravenous Q1 Hr x 2  . prenatal multivitamin  1 tablet Oral Daily  . zolpidem  10 mg Oral QHS  . DISCONTD: prenatal vitamin w/FE, FA  1 tablet Oral Daily   Continuous Infusions:    . dextrose 5 % and 0.45 % NaCl with KCl 10 mEq/L 125 mL/hr at 10/08/12 8466    Active Problems:  Crohn's disease  Dysautomnia  GERD (gastroesophageal reflux disease)  Migraine  Cystitis  Nausea & vomiting  Diarrhea  Anxiety    Time spent: 35 minutes    [facility]  If 8PM-8AM, please contact night-coverage at [email or web address], password [facility] 10/08/2012, 12:10 PM  LOS: 2 days

## 2012-10-08 NOTE — Progress Notes (Signed)
Utilization review complete 

## 2012-10-09 DIAGNOSIS — K5289 Other specified noninfective gastroenteritis and colitis: Secondary | ICD-10-CM

## 2012-10-09 DIAGNOSIS — E876 Hypokalemia: Secondary | ICD-10-CM

## 2012-10-09 LAB — COMPREHENSIVE METABOLIC PANEL
Albumin: 2.9 g/dL — ABNORMAL LOW (ref 3.5–5.2)
Alkaline Phosphatase: 62 U/L (ref 39–117)
BUN: <3 mg/dL — ABNORMAL LOW (ref 6–23)
CO2: 23 mEq/L (ref 19–32)
Chloride: 107 mEq/L (ref 96–112)
GFR calc non Af Amer: >90 mL/min (ref >90)
Glucose, Bld: 125 mg/dL — ABNORMAL HIGH (ref 70–99)
Potassium: 3 mEq/L — ABNORMAL LOW (ref 3.5–5.1)
Total Bilirubin: 0.7 mg/dL (ref 0.3–1.2)

## 2012-10-09 LAB — GLUCOSE, CAPILLARY: Glucose-Capillary: 104 mg/dL — ABNORMAL HIGH (ref 70–99)

## 2012-10-09 MED ORDER — POTASSIUM CHLORIDE 10 MEQ/100ML IV SOLN
10.0000 meq | INTRAVENOUS | Status: AC
  Administered 2012-10-09 (×4): 10 meq via INTRAVENOUS
  Filled 2012-10-09 (×4): qty 100

## 2012-10-09 NOTE — Progress Notes (Signed)
[facility] PROGRESS NOTE  [name] SJG:[id number] DOB: 07/23/1976 DOA: 10/06/2012 PCP: Eliezer Lofts, MD  Assessment/Plan: Intractable nausea vomiting: hx of same. -Persisting, CT scan of abdomen and pelvis 10/15 with multiple areas of mild colon wall thickening which may indicate colitis.no surrounding mesenteric inflammation, no abscess or fistula and no bowel structure. Ultra sound of pelvis done 10/04/12 yields Three >10 mm left ovarian follicles, largest measuring 3.2 cm.  Unremarkable appearance of the right ovary and uterus. C diff negative. Lipase 25. Urine with small leuks and cloudy and >80 keytones. Urine culture no growth.  -continue empiric Cipro and Flagyl -She has a history of Crohn's, and is followed by Dr. Oletta Lamas - -appreciate GI imput, to follow and consider steroids if no improvement on abx -Continue scheduled zofran with compazine and ativan for break through. Not tolerating clear liquid.  -Patient also with mildly elevated LFTs on admit, will recheck and follow. Elevated LFTs -worsening, obtain hepatitis panel, follow and recheck GI following Hypokalemia -replace k Migraine HA, currently stable and asymptomatic  - Fioricet prn   Mood d/o/ anxiety: Uncontrolled  -Continue Holding sertraline for now b/c of common side effect of nausea and vomiting, but strongly encourage titration to maximum dose  - Minimize use of addictive benzodiazepines if possible  Code Status: full Family Communication: directly with pt bedside Disposition Plan: home when medically stable   Consultants:  GI  Procedures:  none  Antibiotics:  Cipro and flagyl started on 10/16  HPI/Subjective: Alert and oriented x3, no diarrhea so far today, states incrased n/v last pm but seems to be slowing down this am. Objective: Filed Vitals:   10/08/12 1443 10/08/12 2048 10/08/12 2117 10/09/12 0532  BP: 114/87  117/72 118/67  Pulse: 93  84 76  Temp: 97.5 F (36.4 C) 98 F (36.7  C) 98.8 F (37.1 C) 99 F (37.2 C)  TempSrc: Oral Oral Oral Oral  Resp: 18  16 16   Height:      Weight:      SpO2: 100%  100% 100%    Intake/Output Summary (Last 24 hours) at 10/09/12 1013 Last data filed at 10/09/12 0522  Gross per 24 hour  Intake 7645.83 ml  Output      0 ml  Net 7645.83 ml   Filed Weights   10/06/12 2031  Weight: 69.5 kg (153 lb 3.5 oz)    Exam:   General:  Awake alert oriented x3  Cardiovascular: RRR No MGR,  Respiratory: normal effort BSCTAB No wheeze, rhonchi  Abdomen: soft, bowel sounds present, nontender and nondistended no organomegaly no masses palpable .   Extremities: No cyanosis and no edema  Data Reviewed: Basic Metabolic Panel:  Lab 65/46/50 0525 10/08/12 0635 10/07/12 0610 10/06/12 0834 10/04/12 1440  NA 139 140 139 139 137  K 3.0* 3.7 3.3* 3.6 4.0  CL 107 109 109 103 102  CO2 23 21 22 24 22   GLUCOSE 125* 130* 113* 109* 76  BUN <3* <3* <3* 5* 9  CREATININE 0.74 0.68 0.66 0.69 0.67  CALCIUM 8.3* 8.7 7.9* 9.0 8.5  MG -- -- -- -- --  PHOS -- -- -- -- --   Liver Function Tests:  Lab 10/09/12 0525 10/07/12 0610 10/06/12 0834 10/04/12 1440  AST 125* 48* 67* 28  ALT 188* 71* 79* 26  ALKPHOS 62 56 78 66  BILITOT 0.7 0.3 0.3 0.2*  PROT 5.7* 5.5* 7.6 6.3  ALBUMIN 2.9* 2.8* 3.9 3.4*    Lab 10/06/12 3546  LIPASE 25  AMYLASE --   No results found for this basename: AMMONIA:5 in the last 168 hours CBC:  Lab 10/08/12 0635 10/07/12 0610 10/06/12 0834 10/04/12 1440  WBC 10.0 8.3 10.1 10.1  NEUTROABS -- -- 7.6 8.0*  HGB 13.5 11.1* 14.1 11.9*  HCT 40.2 32.5* 41.7 36.3  MCV 86.1 86.0 87.4 87.7  PLT 314 245 337 283   Cardiac Enzymes: No results found for this basename: CKTOTAL:5,CKMB:5,CKMBINDEX:5,TROPONINI:5 in the last 168 hours BNP (last 3 results) No results found for this basename: PROBNP:3 in the last 8760 hours CBG:  Lab 10/09/12 0803 10/08/12 0752 10/07/12 0800  GLUCAP 104* 151* 100*    Recent Results (from the  past 240 hour(s))  URINE CULTURE     Status: Normal   Collection Time   10/06/12  8:32 AM      Component Value Range Status Comment   Specimen Description URINE, CLEAN CATCH   Final    Special Requests NONE   Final    Culture  Setup Time 10/06/2012 09:50   Final    Colony Count NO GROWTH   Final    Culture NO GROWTH   Final    Report Status 10/07/2012 FINAL   Final   CLOSTRIDIUM DIFFICILE BY PCR     Status: Normal   Collection Time   10/06/12  8:32 AM      Component Value Range Status Comment   C difficile by pcr NEGATIVE  NEGATIVE Final      Studies: Ct Abdomen Pelvis W Contrast  10/07/2012  *RADIOLOGY REPORT*  Clinical Data:  CT ABDOMEN AND PELVIS WITH CONTRAST  Technique:  Multidetector CT imaging of the abdomen and pelvis was performed following the standard protocol during bolus administration of intravenous contrast.  Contrast: 66m OMNIPAQUE IOHEXOL 300 MG/ML  SOLN  Comparison: May 15, 2012  Findings:  Lung bases are clear. There is a small hiatal hernia.  There is a 1.4 x 1.3 cm cyst in the lateral segment of the left lobe of the liver.  No other liver lesions are identified.  There is no biliary duct dilatation.  Spleen, pancreas, adrenals appear normal.  Flank kidneys show no mass or hydronephrosis on either side.  In the pelvis, there are two cystic areas in the left ovary which show peripheral enhancement, probably representing collapsing cysts.  Each of these lesions measures approximately 2 x 2 cm in size.  There are no other pelvic masses.  The appendix region appears normal.  There is postoperative change in the past; region, a finding noted previously.  There are diverticula arising from the transverse colon.  No evidence of diverticulitis.  There is mild wall thickening at multiple sites in the colon, a finding may indicate some nonspecific colitis.  There is no mesenteric thickening in these areas.  There is no bowel obstruction.  No free air or portal venous air. There is no  ascites, adenopathy, or abscess in the abdomen or pelvis.  No fistula is seen.  Aorta is nonaneurysmal route.  There are no blastic or lytic bone lesions.  IMPRESSION: Postoperative change in the right colon.  Multiple areas of mild colon wall thickening air seen which may indicate colitis.  There is no surrounding mesenteric inflammation.  No abscess or fistula. No bowel obstruction.  Probable collapsing cysts in the left ovary.  There is a small hiatal hernia.   Original Report Authenticated By: WMargit Hanks WOODRUFF III, M.D.     Scheduled Meds:    .  ciprofloxacin  400 mg Intravenous Q12H  . metronidazole  500 mg Intravenous Q8H  . ondansetron (ZOFRAN) IV  8 mg Intravenous Q6H  . pantoprazole (PROTONIX) IV  40 mg Intravenous Q12H  . pneumococcal 23 valent vaccine  0.5 mL Intramuscular Tomorrow-1000  . potassium chloride  10 mEq Intravenous Q1 Hr x 4  . prenatal multivitamin  1 tablet Oral Daily  . zolpidem  10 mg Oral QHS   Continuous Infusions:    . dextrose 5 % and 0.45 % NaCl with KCl 10 mEq/L 125 mL/hr at 10/09/12 0415    Active Problems:  Crohn's disease  Dysautomnia  GERD (gastroesophageal reflux disease)  Migraine  Cystitis  Nausea & vomiting  Diarrhea  Anxiety  Colitis    Time spent: 35 minutes    [facility]  If 8PM-8AM, please contact night-coverage at [email or web address], password [facility] 10/09/2012, 10:13 AM  LOS: 3 days

## 2012-10-09 NOTE — Progress Notes (Signed)
[facility] Progress Note  Subjective: The patient states that she is feeling somewhat better today. She vomited quite a bit last night but has not vomited today. She just take a sip of Coke for the first time. She like to try to advance to a full liquid diet. She still has some abdominal discomfort.  Objective: Vital signs in last 24 hours: Temp:  [98 F (36.7 C)-99 F (37.2 C)] 98.6 F (37 C) (10/17 1410) Pulse Rate:  [76-97] 97  (10/17 1410) Resp:  [16-18] 18  (10/17 1410) BP: (109-118)/(67-75) 109/75 mmHg (10/17 1410) SpO2:  [100 %] 100 % (10/17 1410) Weight change:    PE She is in no distress   Abdomen reveals bowel sounds are present, it is soft, there is some mild diffuse tenderness but no rebound or guarding  Of note is that her liver enzymes seem to be rising. They were normal a few days ago. The etiology of this is unclear. There was a small cyst seen in the liver on CT scan but no other significant lesions and no biliary ductal dilatation. Lab Results: Results for orders placed during the hospital encounter of 10/06/12 (from the past 24 hour(s))  COMPREHENSIVE METABOLIC PANEL     Status: Abnormal   Collection Time   10/09/12  5:25 AM      Component Value Range   Sodium 139  135 - 145 mEq/L   Potassium 3.0 (*) 3.5 - 5.1 mEq/L   Chloride 107  96 - 112 mEq/L   CO2 23  19 - 32 mEq/L   Glucose, Bld 125 (*) 70 - 99 mg/dL   BUN <3 (*) 6 - 23 mg/dL   Creatinine, Ser 0.74  0.50 - 1.10 mg/dL   Calcium 8.3 (*) 8.4 - 10.5 mg/dL   Total Protein 5.7 (*) 6.0 - 8.3 g/dL   Albumin 2.9 (*) 3.5 - 5.2 g/dL   AST 125 (*) 0 - 37 U/L   ALT 188 (*) 0 - 35 U/L   Alkaline Phosphatase 62  39 - 117 U/L   Total Bilirubin 0.7  0.3 - 1.2 mg/dL   GFR calc non Af Amer >90  >90 mL/min   GFR calc Af Amer >90  >90 mL/min  GLUCOSE, CAPILLARY     Status: Abnormal   Collection Time   10/09/12  8:03 AM      Component Value Range   Glucose-Capillary 104 (*) 70 - 99 mg/dL     Studies/Results: @RISRSLT24 @    Assessment: #1 history of Crohn's disease  #2 colitis on CT scan which appears to be mild, etiology unclear as to whether this could be infectious or Crohn's. Her C. difficile toxin was negative  #3 transaminitis etiology unclear    Plan: Continue supportive care with antibiotics. Advanced to a full liquid diet to see if she can tolerate it. Obtain a hepatitis profile.    Wonda Horner 10/09/2012, 2:47 PM  Lab Results  Component Value Date   HGB 13.5 10/08/2012   HGB 11.1* 10/07/2012   HGB 14.1 10/06/2012   HCT 40.2 10/08/2012   HCT 32.5* 10/07/2012   HCT 41.7 10/06/2012   ALKPHOS 62 10/09/2012   ALKPHOS 56 10/07/2012   ALKPHOS 78 10/06/2012   AST 125* 10/09/2012   AST 48* 10/07/2012   AST 67* 10/06/2012   ALT 188* 10/09/2012   ALT 71* 10/07/2012   ALT 79* 10/06/2012   AMYLASE 27 02/08/2010

## 2012-10-10 LAB — COMPREHENSIVE METABOLIC PANEL
AST: 90 U/L — ABNORMAL HIGH (ref 0–37)
BUN: <3 mg/dL — ABNORMAL LOW (ref 6–23)
CO2: 22 mEq/L (ref 19–32)
Calcium: 8.3 mg/dL — ABNORMAL LOW (ref 8.4–10.5)
Creatinine, Ser: 0.7 mg/dL (ref 0.50–1.10)
GFR calc non Af Amer: >90 mL/min (ref >90)
Total Bilirubin: 0.4 mg/dL (ref 0.3–1.2)

## 2012-10-10 LAB — GLUCOSE, CAPILLARY: Glucose-Capillary: 120 mg/dL — ABNORMAL HIGH (ref 70–99)

## 2012-10-10 LAB — CBC
Hemoglobin: 12 g/dL (ref 12.0–15.0)
MCH: 28.8 pg (ref 26.0–34.0)
Platelets: 243 10*3/uL (ref 150–400)
RBC: 4.16 MIL/uL (ref 3.87–5.11)
WBC: 8.5 10*3/uL (ref 4.0–10.5)

## 2012-10-10 LAB — HEPATITIS PANEL, ACUTE
Hep A IgM: NEGATIVE
Hep B C IgM: NEGATIVE
Hepatitis B Surface Ag: NEGATIVE

## 2012-10-10 MED ORDER — METHYLPREDNISOLONE SODIUM SUCC 40 MG IJ SOLR
40.0000 mg | INTRAMUSCULAR | Status: DC
  Administered 2012-10-10 – 2012-10-11 (×2): 40 mg via INTRAVENOUS
  Filled 2012-10-10 (×3): qty 1

## 2012-10-10 MED ORDER — FLUCONAZOLE 50 MG PO TABS
50.0000 mg | ORAL_TABLET | Freq: Every day | ORAL | Status: DC
  Administered 2012-10-10 – 2012-10-11 (×2): 50 mg via ORAL
  Filled 2012-10-10 (×2): qty 1

## 2012-10-10 NOTE — Progress Notes (Signed)
[facility] Progress Note  Subjective: The patient feels a little better today but is still nauseated, and has some lower abdominal pains. She thinks that this is probably a flare up of her Crohn's disease again and asked if we could start steroids.  Objective: Vital signs in last 24 hours: Temp:  [98.6 F (37 C)-99.2 F (37.3 C)] 99.2 F (37.3 C) (10/18 0528) Pulse Rate:  [73-99] 99  (10/18 0528) Resp:  [16-18] 16  (10/18 0528) BP: (92-109)/(65-75) 106/72 mmHg (10/18 0528) SpO2:  [99 %-100 %] 99 % (10/18 0528) Weight change:    PE She is in no distress  Heart regular rhythm  Abdomen reveals bowel sounds to be present, it is soft, there is some discomfort in the lower abdomen. There is no rebound or guarding. Lab Results: Results for orders placed during the hospital encounter of 10/06/12 (from the past 24 hour(s))  COMPREHENSIVE METABOLIC PANEL     Status: Abnormal   Collection Time   10/10/12  5:53 AM      Component Value Range   Sodium 140  135 - 145 mEq/L   Potassium 3.5  3.5 - 5.1 mEq/L   Chloride 108  96 - 112 mEq/L   CO2 22  19 - 32 mEq/L   Glucose, Bld 114 (*) 70 - 99 mg/dL   BUN <3 (*) 6 - 23 mg/dL   Creatinine, Ser 0.70  0.50 - 1.10 mg/dL   Calcium 8.3 (*) 8.4 - 10.5 mg/dL   Total Protein 5.7 (*) 6.0 - 8.3 g/dL   Albumin 2.9 (*) 3.5 - 5.2 g/dL   AST 90 (*) 0 - 37 U/L   ALT 173 (*) 0 - 35 U/L   Alkaline Phosphatase 65  39 - 117 U/L   Total Bilirubin 0.4  0.3 - 1.2 mg/dL   GFR calc non Af Amer >90  >90 mL/min   GFR calc Af Amer >90  >90 mL/min  CBC     Status: Normal   Collection Time   10/10/12  5:53 AM      Component Value Range   WBC 8.5  4.0 - 10.5 K/uL   RBC 4.16  3.87 - 5.11 MIL/uL   Hemoglobin 12.0  12.0 - 15.0 g/dL   HCT 36.0  36.0 - 46.0 %   MCV 86.5  78.0 - 100.0 fL   MCH 28.8  26.0 - 34.0 pg   MCHC 33.3  30.0 - 36.0 g/dL   RDW 12.9  11.5 - 15.5 %   Platelets 243  150 - 400 K/uL  GLUCOSE, CAPILLARY     Status: Abnormal   Collection Time   10/10/12  7:49 AM      Component Value Range   Glucose-Capillary 120 (*) 70 - 99 mg/dL    Studies/Results: @RISRSLT24 @    Assessment: Colitis. This could be infectious however she does have Crohn's disease and it could be reactivation of her Crohn's disease. In talking to her she feels like this is Crohn's. I have no objection to starting steroids at this time, and she is to remain on her current antibiotic therapy. She has requested Diflucan since she is on antibiotics because she has a tendency of getting vaginal yeast infections.  Plan: Continue antibiotics. Begin Solu-Medrol 40 mg IV daily. Begin Diflucan 50 mg by mouth daily to prevent vaginal yeast infection.    Agustus Mane F 10/10/2012, 11:08 AM

## 2012-10-10 NOTE — Progress Notes (Signed)
[facility] PROGRESS NOTE  [name] YIF:[id number] DOB: [birth year] DOA: 10/06/2012 PCP: Eliezer Lofts, MD  Assessment/Plan: Intractable nausea vomiting: hx of same. -Persisting, CT scan of abdomen and pelvis 10/15 with multiple areas of mild colon wall thickening which may indicate colitis.no surrounding mesenteric inflammation, no abscess or fistula and no bowel structure. Ultra sound of pelvis done 10/04/12 yields Three >10 mm left ovarian follicles, largest measuring 3.2 cm.  Unremarkable appearance of the right ovary and uterus. C diff negative. Lipase 25. Urine with small leuks and cloudy and >80 keytones. Urine culture no growth.  -continue empiric Cipro and Flagyl -She has a history of Crohn's, and is followed by Dr. Oletta Lamas - -started on steroids per GI for possible Crohn's flare -Continue scheduled zofran with compazine and ativan for break through. Not tolerating clear liquid.  . Elevated LFTs -Hepatitis panel negative, LFTs trending down GI following Hypokalemia - Migraine HA, currently stable and asymptomatic  - Fioricet prn   Mood d/o/ anxiety: -Continue Holding sertraline for now b/c of common side effect of nausea and vomiting, but strongly encourage titration to maximum dose  - Minimize use of addictive benzodiazepines if possible  Code Status: full Family Communication: directly with pt bedside Disposition Plan: home when medically stable   Consultants:  GI  Procedures:  none  Antibiotics:  Cipro and flagyl started on 10/16  HPI/Subjective: States she feels a little better this morning, still with nausea and right lower abdominal discomfort Objective: Filed Vitals:   10/09/12 1756 10/09/12 2102 10/10/12 0528 10/10/12 1437  BP: 109/68 92/65 106/72 121/80  Pulse: 73 89 99 99  Temp:  99.1 F (37.3 C) 99.2 F (37.3 C) 98.9 F (37.2 C)  TempSrc:  Oral Oral Oral  Resp: 16 16 16 16   Height:      Weight:      SpO2: 100% 99% 99% 100%     Intake/Output Summary (Last 24 hours) at 10/10/12 1630 Last data filed at 10/10/12 1300  Gross per 24 hour  Intake 4373.34 ml  Output      0 ml  Net 4373.34 ml   Filed Weights   10/06/12 2031  Weight: 69.5 kg (153 lb 3.5 oz)    Exam:   General:  Awake alert oriented x3  Cardiovascular: RRR No MGR,  Respiratory: normal effort BSCTAB No wheeze, rhonchi  Abdomen: soft, bowel sounds present, mild right lower quadrant tenderness, no rebound, and nondistended no organomegaly no masses palpable .   Extremities: No cyanosis and no edema  Data Reviewed: Basic Metabolic Panel:  Lab 86/76/72 0553 10/09/12 0525 10/08/12 0635 10/07/12 0610 10/06/12 0834  NA 140 139 140 139 139  K 3.5 3.0* 3.7 3.3* 3.6  CL 108 107 109 109 103  CO2 22 23 21 22 24   GLUCOSE 114* 125* 130* 113* 109*  BUN <3* <3* <3* <3* 5*  CREATININE 0.70 0.74 0.68 0.66 0.69  CALCIUM 8.3* 8.3* 8.7 7.9* 9.0  MG -- -- -- -- --  PHOS -- -- -- -- --   Liver Function Tests:  Lab 10/10/12 0553 10/09/12 0525 10/07/12 0610 10/06/12 0834 10/04/12 1440  AST 90* 125* 48* 67* 28  ALT 173* 188* 71* 79* 26  ALKPHOS 65 62 56 78 66  BILITOT 0.4 0.7 0.3 0.3 0.2*  PROT 5.7* 5.7* 5.5* 7.6 6.3  ALBUMIN 2.9* 2.9* 2.8* 3.9 3.4*    Lab 10/06/12 0834  LIPASE 25  AMYLASE --   No results found for this basename:  AMMONIA:5 in the last 168 hours CBC:  Lab 10/10/12 0553 10/08/12 0635 10/07/12 0610 10/06/12 0834 10/04/12 1440  WBC 8.5 10.0 8.3 10.1 10.1  NEUTROABS -- -- -- 7.6 8.0*  HGB 12.0 13.5 11.1* 14.1 11.9*  HCT 36.0 40.2 32.5* 41.7 36.3  MCV 86.5 86.1 86.0 87.4 87.7  PLT 243 314 245 337 283   Cardiac Enzymes: No results found for this basename: CKTOTAL:5,CKMB:5,CKMBINDEX:5,TROPONINI:5 in the last 168 hours BNP (last 3 results) No results found for this basename: PROBNP:3 in the last 8760 hours CBG:  Lab 10/10/12 0749 10/09/12 0803 10/08/12 0752 10/07/12 0800  GLUCAP 120* 104* 151* 100*    Recent Results  (from the past 240 hour(s))  URINE CULTURE     Status: Normal   Collection Time   10/06/12  8:32 AM      Component Value Range Status Comment   Specimen Description URINE, CLEAN CATCH   Final    Special Requests NONE   Final    Culture  Setup Time 10/06/2012 09:50   Final    Colony Count NO GROWTH   Final    Culture NO GROWTH   Final    Report Status 10/07/2012 FINAL   Final   CLOSTRIDIUM DIFFICILE BY PCR     Status: Normal   Collection Time   10/06/12  8:32 AM      Component Value Range Status Comment   C difficile by pcr NEGATIVE  NEGATIVE Final      Studies: No results found.  Scheduled Meds:    . ciprofloxacin  400 mg Intravenous Q12H  . fluconazole  50 mg Oral Daily  . methylPREDNISolone (SOLU-MEDROL) injection  40 mg Intravenous Q24H  . metronidazole  500 mg Intravenous Q8H  . ondansetron (ZOFRAN) IV  8 mg Intravenous Q6H  . pantoprazole (PROTONIX) IV  40 mg Intravenous Q12H  . potassium chloride  10 mEq Intravenous Q1 Hr x 4  . prenatal multivitamin  1 tablet Oral Daily  . zolpidem  10 mg Oral QHS   Continuous Infusions:    . dextrose 5 % and 0.45 % NaCl with KCl 10 mEq/L 125 mL/hr at 10/10/12 1135    Active Problems:  Crohn's disease  Dysautomnia  GERD (gastroesophageal reflux disease)  Migraine  Cystitis  Nausea & vomiting  Diarrhea  Anxiety  Colitis  Hypokalemia    Time spent: 30 minutes    [facility]  If 8PM-8AM, please contact night-coverage at [email or web address], password [facility] 10/10/2012, 4:30 PM  LOS: 4 days

## 2012-10-11 LAB — GLUCOSE, CAPILLARY: Glucose-Capillary: 112 mg/dL — ABNORMAL HIGH (ref 70–99)

## 2012-10-11 LAB — BASIC METABOLIC PANEL
CO2: 21 mEq/L (ref 19–32)
Chloride: 109 mEq/L (ref 96–112)
GFR calc Af Amer: >90 mL/min (ref >90)
Potassium: 3.5 mEq/L (ref 3.5–5.1)
Sodium: 139 mEq/L (ref 135–145)

## 2012-10-11 MED ORDER — PREDNISONE 20 MG PO TABS: 40.0000 mg | ORAL_TABLET | Freq: Every day | ORAL | Status: DC

## 2012-10-11 MED ORDER — CIPROFLOXACIN HCL 500 MG PO TABS: 500.0000 mg | ORAL_TABLET | Freq: Two times a day (BID) | ORAL | Status: DC

## 2012-10-11 MED ORDER — PROCHLORPERAZINE MALEATE 5 MG PO TABS: 5.0000 mg | ORAL_TABLET | Freq: Four times a day (QID) | ORAL | Status: DC | PRN

## 2012-10-11 MED ORDER — METRONIDAZOLE 500 MG PO TABS: 500.0000 mg | ORAL_TABLET | Freq: Three times a day (TID) | ORAL | Status: DC

## 2012-10-11 NOTE — Discharge Summary (Addendum)
Physician Discharge Summary  [name] MCN:[id number] DOB: 1976/04/02 DOA: 10/06/2012  PCP: Eliezer Lofts, MD  Admit date: 10/06/2012 Discharge date: 10/11/2012  Recommendations for Outpatient Follow-up:      Follow-up Information    Follow up with Eliezer Lofts, MD. (in 1-2weeks, call for appt upon discharge)    Contact information:   [address]. [place] [address] [phone]       Follow up with EDWARDS JR,JAMES L, MD. (in 1week, call for appt upon discharge)    Contact information:   [address], [place] [address] [phone]           Discharge Diagnoses:  Active Problems:  Crohn's disease  Dysautomnia  GERD (gastroesophageal reflux disease)  Migraine  Cystitis  Nausea & vomiting  Diarrhea  Anxiety  Colitis  Hypokalemia   Discharge Condition: improved/stable  Diet recommendation: regular  Filed Weights   10/06/12 2031  Weight: 69.5 kg (153 lb 3.5 oz)    History of present illness:  The patient is a 36 yo female with history of crohn's disease, asthma, endometriosis, dysautonomia, and palpitations who presents with a 5 day history of nausea and vomiting. She last felt well on Tuesday. Wednesday, she developed suprapubic pain and back pain and vulvar pain. She has history of recurrent UTI after intercourse and had Macrobid at home so she started taking it on Wednesday. She also went to see her primary care physician who gave her a shot of rocephin and started keflex and told her to use zofran for nausea. She was also referred to urology because of her history of frequent UTI. On Thursday and Friday she "rotted in bed" with nausea and vomiting. She took Gatorade and ice chips and she is unsure how much she was vomiting because it was so frequent, but she denies blood. It was occasionally mildly green tinged. On Saturday, she called her OB/GYN who recommended going to  [facility] because of risk for ruptured cyst. She had four follicles on the right ovary and her OB/GYN did not think that that was the cause of her nausea. They hydrated her and discharged her with more nausea medication. Yesterday, she took xanax with zofran, which helped some. Today, she continued to have severe nausea and vomiting, so she returned to the emergency department. She was admitted for further evaluation and management.    Hospital Course by problem list:  Intractable nausea vomiting/Crohn's flare -Upon admission the patient had a CT scan of abdomen and pelvis 10/15 with multiple areas of mild colon wall thickening which may indicate colitis.no surrounding mesenteric inflammation, no abscess or fistula and no bowel structure. Ultra sound of pelvis done 10/04/12 yields Three >10 mm left ovarian follicles, largest measuring 3.2 cm. Unremarkable appearance of the right ovary and uterus. C diff negative. Lipase 25. Urine with small leuks and cloudy and >80 keytones. Urine culture no growth.  -Following the above CT scan results she was started on empiric antibiotics with Cipro and Flagyl and GI was consulted -The symptoms began improving on antibiotics, and on followup with GI she indicated that it felt like her previous Crohn's flares and so she was started on steroids. -As she improved on the above treatment liquid diet was advanced, and today she is tolerating solids and is clinically improved. -From GI standpoint she is okay for discharge, she is requesting to go home, and I agree. -She is  to continue the oral antibiotics for one more week, and will be discharged on prednisone 40 mg as per GI recommendations and is to followup with Dr. Oletta Lamas for tapering as appropriate. Elevated LFTs  -Patient's LFTs were elevated on admission, Hepatitis panel negative, follow up LFTs were trending down prior to discharge.  Hypokalemia  - Secondary to GI losses. Resolved, Her potassium was replaced  during this hospital stay. Migraine HA, currently stable and asymptomatic  - She was maintained on Fioricet prn, and is to continue this upon discharge.  Mood d/o/ anxiety:  -Her Zoloft was held as can also cause nausea, she is to resume it upon discharge and follow up outpatient.   Procedures:  none  Consultations:  GI: Dr. Penelope Coop  Discharge Exam: Filed Vitals:   10/10/12 1437 10/10/12 2146 10/11/12 0444 10/11/12 1408  BP: 121/80 111/72 113/60 123/69  Pulse: 99 100 100 82  Temp: 98.9 F (37.2 C) 98.4 F (36.9 C) 98.6 F (37 C) 98.2 F (36.8 C)  TempSrc: Oral  Oral Oral  Resp: 16 18 18 18   Height:      Weight:      SpO2: 100% 100% 100% 100%     Exam:  General: Awake alert oriented x3  Cardiovascular: RRR No MGR,  Respiratory: normal effort BSCTAB No wheeze, rhonchi  Abdomen: soft, bowel sounds present, mild right lower quadrant tenderness, no rebound, and nondistended no organomegaly no masses palpable .  Extremities: No cyanosis and no edema   Discharge Instructions  Discharge Orders    Future Appointments: Provider: Department: Dept Phone: Center:   12/22/2012 8:30 AM [facility] [phone] [facility]     Future Orders Please Complete By Expires   Diet general      Increase activity slowly          Medication List     As of 10/11/2012  2:18 PM    TAKE these medications         acetaminophen 500 MG tablet   Commonly known as: TYLENOL   Take 1,000 mg by mouth every 6 (six) hours as needed. For pain      ALPRAZolam 0.25 MG tablet   Commonly known as: XANAX   Take 0.25-0.5 mg by mouth daily as needed. For anxiety      butalbital-acetaminophen-caffeine 50-325-40 MG per tablet   Commonly known as: FIORICET, ESGIC   Take 1 tablet by mouth 2 (two) times daily as needed. For migraines      cetirizine 10 MG tablet   Commonly known as: ZYRTEC   Take 10 mg by mouth daily.      ciprofloxacin 500 MG tablet    Commonly known as: CIPRO   Take 1 tablet (500 mg total) by mouth 2 (two) times daily.      clomiPHENE 50 MG tablet   Commonly known as: CLOMID   Take 150 mg by mouth daily.      dexlansoprazole 60 MG capsule   Commonly known as: DEXILANT   Take 60 mg by mouth daily.      metroNIDAZOLE 500 MG tablet   Commonly known as: FLAGYL   Take 1 tablet (500 mg total) by mouth 3 (three) times daily.      ondansetron 8 MG tablet   Commonly known as: ZOFRAN   Take 8 mg by mouth 2 (two) times daily as needed. As needed for nausea/vomiting.      predniSONE 20 MG tablet   Commonly known as:  DELTASONE   Take 2 tablets (40 mg total) by mouth daily.      Prenatal Vitamins (DIS) Tabs   Take 1 tablet by mouth daily.      prochlorperazine 5 MG tablet   Commonly known as: COMPAZINE   Take 1 tablet (5 mg total) by mouth every 6 (six) hours as needed for nausea.      sertraline 25 MG tablet   Commonly known as: ZOLOFT   Take 25-50 mg by mouth daily.      zolpidem 10 MG tablet   Commonly known as: AMBIEN   Take 1 tablet (10 mg total) by mouth at bedtime. Scheduled.           Follow-up Information    Follow up with Eliezer Lofts, MD. (in 1-2weeks, call for appt upon discharge)    Contact information:   [address]. [place] [address] [phone]       Follow up with EDWARDS JR,JAMES L, MD. (in 1week, call for appt upon discharge)    Contact information:   [address] [place] [address] [phone]           The results of significant diagnostics from this hospitalization (including imaging, microbiology, ancillary and laboratory) are listed below for reference.    Significant Diagnostic Studies: US Transvaginal Non-ob  2012-10-25  *RADIOLOGY REPORT*  Clinical Data: Pelvic pain. Nausea and vomiting.  Endometriosis. Crohn's disease.  On Clomid. LMP 09/19/2012.  TRANSABDOMINAL AND  TRANSVAGINAL ULTRASOUND OF PELVIS  Technique:  Both transabdominal and transvaginal ultrasound examinations of the pelvis were performed.  Transabdominal technique was performed for global imaging of the pelvis including uterus, ovaries, adnexal regions, and pelvic cul-de-sac.  It was necessary to proceed with endovaginal exam following the transabdominal exam to visualize the endometrium and left ovarian cysts.  Comparison:  CT on 05/15/2012  Findings: Uterus:  9.2 x 4.8 x 5.5 cm.  Heterogeneous uterine myometrium noted, but no discrete fibroids identified.  Endometrium: Double layer thickness measures 6 mm transvaginally. No focal lesion visualized.  Right ovary: 5.0 x 1.6 x 3.0 cm.  Normal appearance.  Small less than 10 cm follicles noted.  Left ovary: 5.4 x 3.8 x 3.7 cm.  Several follicles seen, three which are greater than 10 mm.  Largest of these measures 3.2 cm in maximum diameter.  No complex cystic or solid masses identified.  Other Findings:  No free fluid  IMPRESSION:  1.  Three >10 mm left ovarian follicles, largest measuring 3.2 cm. 2.  Unremarkable appearance of the right ovary and uterus.   Original Report Authenticated By: Marlaine Hind, M.D.    US Pelvis Complete  10-25-12  *RADIOLOGY REPORT*  Clinical Data: Pelvic pain. Nausea and vomiting.  Endometriosis. Crohn's disease.  On Clomid. LMP 09/19/2012.  TRANSABDOMINAL AND TRANSVAGINAL ULTRASOUND OF PELVIS  Technique:  Both transabdominal and transvaginal ultrasound examinations of the pelvis were performed.  Transabdominal technique was performed for global imaging of the pelvis including uterus, ovaries, adnexal regions, and pelvic cul-de-sac.  It was necessary to proceed with endovaginal exam following the transabdominal exam to visualize the endometrium and left ovarian cysts.  Comparison:  CT on 05/15/2012  Findings: Uterus:  9.2 x 4.8 x 5.5 cm.  Heterogeneous uterine myometrium noted, but no discrete fibroids identified.  Endometrium:  Double layer thickness measures 6 mm transvaginally. No focal lesion visualized.  Right ovary: 5.0 x 1.6  x 3.0 cm.  Normal appearance.  Small less than 10 cm follicles noted.  Left ovary: 5.4 x 3.8 x 3.7 cm.  Several follicles seen, three which are greater than 10 mm.  Largest of these measures 3.2 cm in maximum diameter.  No complex cystic or solid masses identified.  Other Findings:  No free fluid  IMPRESSION:  1.  Three >10 mm left ovarian follicles, largest measuring 3.2 cm. 2.  Unremarkable appearance of the right ovary and uterus.   Original Report Authenticated By: Marlaine Hind, M.D.    Ct Abdomen Pelvis W Contrast  10/07/2012  *RADIOLOGY REPORT*  Clinical Data:  CT ABDOMEN AND PELVIS WITH CONTRAST  Technique:  Multidetector CT imaging of the abdomen and pelvis was performed following the standard protocol during bolus administration of intravenous contrast.  Contrast: 2m OMNIPAQUE IOHEXOL 300 MG/ML  SOLN  Comparison: May 15, 2012  Findings:  Lung bases are clear. There is a small hiatal hernia.  There is a 1.4 x 1.3 cm cyst in the lateral segment of the left lobe of the liver.  No other liver lesions are identified.  There is no biliary duct dilatation.  Spleen, pancreas, adrenals appear normal.  Flank kidneys show no mass or hydronephrosis on either side.  In the pelvis, there are two cystic areas in the left ovary which show peripheral enhancement, probably representing collapsing cysts.  Each of these lesions measures approximately 2 x 2 cm in size.  There are no other pelvic masses.  The appendix region appears normal.  There is postoperative change in the past; region, a finding noted previously.  There are diverticula arising from the transverse colon.  No evidence of diverticulitis.  There is mild wall thickening at multiple sites in the colon, a finding may indicate some nonspecific colitis.  There is no mesenteric thickening in these areas.  There is no bowel obstruction.  No free air or  portal venous air. There is no ascites, adenopathy, or abscess in the abdomen or pelvis.  No fistula is seen.  Aorta is nonaneurysmal route.  There are no blastic or lytic bone lesions.  IMPRESSION: Postoperative change in the right colon.  Multiple areas of mild colon wall thickening air seen which may indicate colitis.  There is no surrounding mesenteric inflammation.  No abscess or fistula. No bowel obstruction.  Probable collapsing cysts in the left ovary.  There is a small hiatal hernia.   Original Report Authenticated By: WMargit Hanks WOODRUFF III, M.D.     Microbiology: Recent Results (from the past 240 hour(s))  URINE CULTURE     Status: Normal   Collection Time   10/06/12  8:32 AM      Component Value Range Status Comment   Specimen Description URINE, CLEAN CATCH   Final    Special Requests NONE   Final    Culture  Setup Time 10/06/2012 09:50   Final    Colony Count NO GROWTH   Final    Culture NO GROWTH   Final    Report Status 10/07/2012 FINAL   Final   CLOSTRIDIUM DIFFICILE BY PCR     Status: Normal   Collection Time   10/06/12  8:32 AM      Component Value Range Status Comment   C difficile by pcr NEGATIVE  NEGATIVE Final      Labs: Basic Metabolic Panel:  Lab [id number] 10/10/12 0045410/17/13 0525 10/08/12 0635 10/07/12 0610  NA 139 140 139 140 139  K 3.5 3.5 3.0* 3.7 3.3*  CL 109 108 107 109 109  CO2 21 22 23 21 22   GLUCOSE 108* 114* 125* 130* 113*  BUN <3* <3* <3* <3* <3*  CREATININE 0.65 0.70 0.74 0.68 0.66  CALCIUM 8.4 8.3* 8.3* 8.7 7.9*  MG -- -- -- -- --  PHOS -- -- -- -- --   Liver Function Tests:  Lab 10/10/12 0553 10/09/12 0525 10/07/12 0610 10/06/12 0834 10/04/12 1440  AST 90* 125* 48* 67* 28  ALT 173* 188* 71* 79* 26  ALKPHOS 65 62 56 78 66  BILITOT 0.4 0.7 0.3 0.3 0.2*  PROT 5.7* 5.7* 5.5* 7.6 6.3  ALBUMIN 2.9* 2.9* 2.8* 3.9 3.4*    Lab 10/06/12 0834  LIPASE 25  AMYLASE --   No results found for this basename: AMMONIA:5 in the last 168  hours CBC:  Lab 10/10/12 0553 10/08/12 0635 10/07/12 0610 10/06/12 0834 10/04/12 1440  WBC 8.5 10.0 8.3 10.1 10.1  NEUTROABS -- -- -- 7.6 8.0*  HGB 12.0 13.5 11.1* 14.1 11.9*  HCT 36.0 40.2 32.5* 41.7 36.3  MCV 86.5 86.1 86.0 87.4 87.7  PLT 243 314 245 337 283   Cardiac Enzymes: No results found for this basename: CKTOTAL:5,CKMB:5,CKMBINDEX:5,TROPONINI:5 in the last 168 hours BNP: BNP (last 3 results) No results found for this basename: PROBNP:3 in the last 8760 hours CBG:  Lab 10/11/12 0757 10/10/12 0749 10/09/12 0803 10/08/12 0752 10/07/12 0800  GLUCAP 112* 120* 104* 151* 100*    Time coordinating discharge: >84mnutes  Signed:  Nayellie Sanseverino C  [facility] 10/11/2012, 2:18 PM

## 2012-10-11 NOTE — Progress Notes (Signed)
[facility] Progress Note  Subjective: She feels much better. IV solumedrol really helped. She is eating. Wants to go home.  Objective: Vital signs in last 24 hours: Temp:  [98.4 F (36.9 C)-98.9 F (37.2 C)] 98.6 F (37 C) (10/19 0444) Pulse Rate:  [99-100] 100  (10/19 0444) Resp:  [16-18] 18  (10/19 0444) BP: (111-121)/(60-80) 113/60 mmHg (10/19 0444) SpO2:  [100 %] 100 % (10/19 0444) Weight change:    PE: No distress. Abdomen with some slight discomfort in lower abdomen. Lab Results: Results for orders placed during the hospital encounter of 10/06/12 (from the past 24 hour(s))  BASIC METABOLIC PANEL     Status: Abnormal   Collection Time   10/11/12  5:50 AM      Component Value Range   Sodium 139  135 - 145 mEq/L   Potassium 3.5  3.5 - 5.1 mEq/L   Chloride 109  96 - 112 mEq/L   CO2 21  19 - 32 mEq/L   Glucose, Bld 108 (*) 70 - 99 mg/dL   BUN <3 (*) 6 - 23 mg/dL   Creatinine, Ser 0.65  0.50 - 1.10 mg/dL   Calcium 8.4  8.4 - 10.5 mg/dL   GFR calc non Af Amer >90  >90 mL/min   GFR calc Af Amer >90  >90 mL/min  GLUCOSE, CAPILLARY     Status: Abnormal   Collection Time   10/11/12  7:57 AM      Component Value Range   Glucose-Capillary 112 (*) 70 - 99 mg/dL   Comment 1 Documented in Chart     Comment 2 Notify RN      Studies/Results: @RISRSLT24 @    Assessment: Crohns with flare up.   Plan: OK to discharge home. Would send home on Prednisone 24m daily> She should follow up with Dr EOletta Lamasin one week. He can taper the Prednisone. Would also send home on Cipro and Flagyl for one more week.    GANEM,SALEM F 10/11/2012, 10:45 AM

## 2012-10-11 NOTE — Progress Notes (Signed)
[name] to be D/C'd Home per MD order.  Discussed with the patient and all questions fully answered.   [name], [name]  Home Medication Instructions DPT:[id number]   Printed on:10/11/12 1526  Medication Information                    Prenatal Vitamins (DIS) TABS Take 1 tablet by mouth daily.             butalbital-acetaminophen-caffeine (FIORICET, ESGIC) 50-325-40 MG per tablet Take 1 tablet by mouth 2 (two) times daily as needed. For migraines           dexlansoprazole (DEXILANT) 60 MG capsule Take 60 mg by mouth daily.           clomiPHENE (CLOMID) 50 MG tablet Take 150 mg by mouth daily.            acetaminophen (TYLENOL) 500 MG tablet Take 1,000 mg by mouth every 6 (six) hours as needed. For pain           ALPRAZolam (XANAX) 0.25 MG tablet Take 0.25-0.5 mg by mouth daily as needed. For anxiety           cetirizine (ZYRTEC) 10 MG tablet Take 10 mg by mouth daily.           zolpidem (AMBIEN) 10 MG tablet Take 1 tablet (10 mg total) by mouth at bedtime. Scheduled.           sertraline (ZOLOFT) 25 MG tablet Take 25-50 mg by mouth daily.            ondansetron (ZOFRAN) 8 MG tablet Take 8 mg by mouth 2 (two) times daily as needed. As needed for nausea/vomiting.           prochlorperazine (COMPAZINE) 5 MG tablet Take 1 tablet (5 mg total) by mouth every 6 (six) hours as needed for nausea.           metroNIDAZOLE (FLAGYL) 500 MG tablet Take 1 tablet (500 mg total) by mouth 3 (three) times daily.           ciprofloxacin (CIPRO) 500 MG tablet Take 1 tablet (500 mg total) by mouth 2 (two) times daily.           predniSONE (DELTASONE) 20 MG tablet Take 2 tablets (40 mg total) by mouth daily.             VVS, Skin clean, dry and intact without evidence of skin break down, no evidence of skin tears noted. IV catheter discontinued intact. Site without signs and symptoms of complications. Dressing and pressure applied.  An After Visit Summary was printed and given to the  patient. Follow up appointments , new prescriptions and medication administration times given Patient escorted via [place], and D/C home via private auto.  Park Breed, RN 10/11/2012 3:26 PM

## 2012-10-20 ENCOUNTER — Other Ambulatory Visit: Payer: Self-pay | Admitting: *Deleted

## 2012-10-21 ENCOUNTER — Encounter: Payer: Self-pay | Admitting: Family Medicine

## 2012-10-21 ENCOUNTER — Ambulatory Visit (INDEPENDENT_AMBULATORY_CARE_PROVIDER_SITE_OTHER): Payer: PRIVATE HEALTH INSURANCE | Admitting: Family Medicine

## 2012-10-21 VITALS — BP 110/76 | HR 88 | Temp 98.5°F | Wt 147.5 lb

## 2012-10-21 DIAGNOSIS — R7989 Other specified abnormal findings of blood chemistry: Secondary | ICD-10-CM

## 2012-10-21 DIAGNOSIS — R112 Nausea with vomiting, unspecified: Secondary | ICD-10-CM

## 2012-10-21 DIAGNOSIS — F411 Generalized anxiety disorder: Secondary | ICD-10-CM

## 2012-10-21 DIAGNOSIS — K5090 Crohn's disease, unspecified, without complications: Secondary | ICD-10-CM

## 2012-10-21 DIAGNOSIS — Z3490 Encounter for supervision of normal pregnancy, unspecified, unspecified trimester: Secondary | ICD-10-CM

## 2012-10-21 DIAGNOSIS — E876 Hypokalemia: Secondary | ICD-10-CM

## 2012-10-21 DIAGNOSIS — F419 Anxiety disorder, unspecified: Secondary | ICD-10-CM

## 2012-10-21 DIAGNOSIS — Z331 Pregnant state, incidental: Secondary | ICD-10-CM

## 2012-10-21 LAB — COMPREHENSIVE METABOLIC PANEL
ALT: 57 U/L — ABNORMAL HIGH (ref 0–35)
Alkaline Phosphatase: 55 U/L (ref 39–117)
CO2: 24 mEq/L (ref 19–32)
Creatinine, Ser: 0.7 mg/dL (ref 0.4–1.2)
GFR: 101.87 mL/min (ref >60.00)
Sodium: 137 mEq/L (ref 135–145)
Total Bilirubin: 0.4 mg/dL (ref 0.3–1.2)
Total Protein: 6.4 g/dL (ref 6.0–8.3)

## 2012-10-21 LAB — HCG, QUANTITATIVE, PREGNANCY: hCG, Beta Chain, Quant, S: 153.86 m[IU]/mL

## 2012-10-21 MED ORDER — ZOLPIDEM TARTRATE 10 MG PO TABS: 10.0000 mg | ORAL_TABLET | Freq: Every day | ORAL | Status: DC

## 2012-10-21 NOTE — Assessment & Plan Note (Signed)
On prednisone. Follow up with GI.

## 2012-10-21 NOTE — Assessment & Plan Note (Signed)
Prenatal vitamins. Review meds and risk levels and need. Has appt with OB on 11/14.

## 2012-10-21 NOTE — Progress Notes (Signed)
Subjective:    Patient ID: [name], female    DOB: Sep 12, 1976, 36 y.o.   MRN: [id number]  [facility] Admission 10/14-10/19  Intractable nausea vomiting/Crohn's flare  -Upon admission the patient had a CT scan of abdomen and pelvis 10/15 with multiple areas of mild colon wall thickening which may indicate colitis. No surrounding mesenteric inflammation, no abscess or fistula and no bowel structure. Ultrasound of pelvis done 10/04/12 yields Three >10 mm left ovarian follicles, largest measuring 3.2 cm. Unremarkable appearance of the right ovary and uterus. C diff negative. Lipase 25. Urine with small leuks and cloudy and >80 keytones. Urine culture no growth.  -Following the above CT scan results she was started on empiric antibiotics with Cipro and Flagyl and GI was consulted  -The symptoms began improving on antibiotics, and on followup with GI she indicated that it felt like her previous Crohn's flares and so she was started on steroids.  -As she improved on the above treatment liquid diet was advanced, and today she is tolerating solids and is clinically improved.   Elevated LFTs  -Patient's LFTs were elevated on admission, Hepatitis panel negative, follow up LFTs were trending down prior to discharge.  Hypokalemia  - Secondary to GI losses. Resolved, Her potassium was replaced during  hospital stay.  Migraine HA, currently stable and asymptomatic  - She was maintained on Fioricet prn, and is to continue this upon discharge.  Mood d/o/ anxiety:  -Her Zoloft was held as can also cause nausea, she is to resume it upon discharge and follow up outpatient.   -She continued the oral antibiotics for one more week, and was discharged on prednisone 40 mg as per GI recommendations and is to followup with Dr. Oletta Lamas for tapering as appropriate.   Since discharge from hospital she states is doing well but remains weak.  She still has abdominal pain but it is improving. She has had some  issues with uveitis.. Followed by eye MD on prednisone drops. She is eating again now.  She reports that her mood is doing well. She wishes to stay on SSRI at current dose.  She has had a positive pregnancy test 3 days ago. She would like to check BHCG levels. Has appt with OB GYN with Dr. Harrington Challenger at [facility] on Nov. 14. She has run the  Prednisone by them and they have states this is okay to continue.  We reviewed he meds in detail and discussed risks. She is on prenatal vitamins.  She refused flu and will look into Tdap.        Review of Systems  Constitutional: Negative for fever and fatigue.  HENT: Negative for ear pain.   Eyes: Positive for photophobia. Negative for pain.  Respiratory: Negative for chest tightness and shortness of breath.   Cardiovascular: Negative for chest pain, palpitations and leg swelling.  Gastrointestinal: Negative for abdominal pain.  Genitourinary: Negative for dysuria.       Objective:   Physical Exam  Constitutional: Vital signs are normal. She appears well-developed and well-nourished. She is cooperative.  Non-toxic appearance. She does not appear ill. No distress.  HENT:  Head: Normocephalic.  Right Ear: Hearing, tympanic membrane, external ear and ear canal normal. Tympanic membrane is not erythematous, not retracted and not bulging.  Left Ear: Hearing, tympanic membrane, external ear and ear canal normal. Tympanic membrane is not erythematous, not retracted and not bulging.  Nose: No mucosal edema or rhinorrhea. Right sinus exhibits no maxillary  sinus tenderness and no frontal sinus tenderness. Left sinus exhibits no maxillary sinus tenderness and no frontal sinus tenderness.  Mouth/Throat: Uvula is midline, oropharynx is clear and moist and mucous membranes are normal.  Eyes: Conjunctivae normal, EOM and lids are normal. Pupils are equal, round, and reactive to light. No foreign bodies found.  Neck: Trachea normal and normal range of  motion. Neck supple. Carotid bruit is not present. No mass and no thyromegaly present.  Cardiovascular: Normal rate, regular rhythm, S1 normal, S2 normal, normal heart sounds, intact distal pulses and normal pulses.  Exam reveals no gallop and no friction rub.   No murmur heard. Pulmonary/Chest: Effort normal and breath sounds normal. Not tachypneic. No respiratory distress. She has no decreased breath sounds. She has no wheezes. She has no rhonchi. She has no rales.  Abdominal: Soft. Normal appearance and bowel sounds are normal. There is tenderness in the right lower quadrant.  Neurological: She is alert.  Skin: Skin is warm, dry and intact. No rash noted.  Psychiatric: Her speech is normal and behavior is normal. Judgment and thought content normal. Her mood appears not anxious. Cognition and memory are normal. She does not exhibit a depressed mood.          Assessment & Plan:

## 2012-10-21 NOTE — Telephone Encounter (Signed)
Refill called in to pharmacy

## 2012-10-21 NOTE — Assessment & Plan Note (Signed)
Improved

## 2012-10-21 NOTE — Patient Instructions (Signed)
We will call you with results. Look into when your last Tdap was.

## 2012-10-21 NOTE — Assessment & Plan Note (Signed)
Resovled at discharge.

## 2012-10-21 NOTE — Assessment & Plan Note (Signed)
Stable on SSRI. Reviewed risk and benefits of meds. Pt will continue for now.

## 2012-10-22 ENCOUNTER — Telehealth: Payer: Self-pay | Admitting: Family Medicine

## 2012-10-22 NOTE — Telephone Encounter (Signed)
Caller: Insurance claims handler; Patient Name: [name]; PCP: Eliezer Lofts [facility]); Best Callback Phone Number: [phone].  Pt calling today 10/22/12 regarding was in office yesterday and had labs drawn and office was supposed to call her with results.  Has not received call so she is checking to see if results are back.  [facility] pulled chart and see note from MD yesterday 10/21/12 at 2:45 PM stating, "Notify pt liver function almost entirely back in nml range. Normal potassium. BHCG suggests pregnancy is still very early on...1-3 weeks.  Please fax to her OB."  Pt wanted to know what the BHCG result was and RN told pt result is 153.86.

## 2012-11-12 ENCOUNTER — Telehealth: Payer: Self-pay | Admitting: Family Medicine

## 2012-11-12 NOTE — Telephone Encounter (Signed)
Patient called noting that pregnancy is ectopic noted yesterday 11/11/12 on Ultrasound-was 7.[redacted] weeks GA.  Last 24 hours have been very difficult.  Had stopped taking Xanax and weaned down Zoloft to 25 mg daily.  With the emotions she is requesting to restart medications, but will need some guidance.  Is almost out of Zoloft - not sure of Xanax .  Last seen by Dr. Diona Browner 10/21/12.  Uses [facility] on [address] at [phone].

## 2012-11-13 ENCOUNTER — Inpatient Hospital Stay (HOSPITAL_COMMUNITY): Payer: PRIVATE HEALTH INSURANCE

## 2012-11-13 ENCOUNTER — Encounter (HOSPITAL_COMMUNITY): Payer: Self-pay | Admitting: *Deleted

## 2012-11-13 ENCOUNTER — Inpatient Hospital Stay (HOSPITAL_COMMUNITY)
Admission: AD | Admit: 2012-11-13 | Discharge: 2012-11-13 | Payer: PRIVATE HEALTH INSURANCE | Source: Ambulatory Visit | Attending: Obstetrics and Gynecology | Admitting: Obstetrics and Gynecology

## 2012-11-13 DIAGNOSIS — R1031 Right lower quadrant pain: Secondary | ICD-10-CM

## 2012-11-13 DIAGNOSIS — R109 Unspecified abdominal pain: Secondary | ICD-10-CM | POA: Insufficient documentation

## 2012-11-13 DIAGNOSIS — O009 Unspecified ectopic pregnancy without intrauterine pregnancy: Secondary | ICD-10-CM

## 2012-11-13 DIAGNOSIS — O00109 Unspecified tubal pregnancy without intrauterine pregnancy: Secondary | ICD-10-CM | POA: Insufficient documentation

## 2012-11-13 LAB — CBC
HCT: 34 % — ABNORMAL LOW (ref 36.0–46.0)
Hemoglobin: 11.3 g/dL — ABNORMAL LOW (ref 12.0–15.0)
MCH: 29.7 pg (ref 26.0–34.0)
MCHC: 33.2 g/dL (ref 30.0–36.0)
RBC: 3.8 MIL/uL — ABNORMAL LOW (ref 3.87–5.11)

## 2012-11-13 MED ORDER — HYDROMORPHONE HCL PF 1 MG/ML IJ SOLN
INTRAMUSCULAR | Status: AC
  Filled 2012-11-13: qty 1

## 2012-11-13 MED ORDER — ALPRAZOLAM 0.25 MG PO TABS: 0.2500 mg | ORAL_TABLET | Freq: Every day | ORAL | Status: DC | PRN

## 2012-11-13 MED ORDER — HYDROMORPHONE HCL PF 1 MG/ML IJ SOLN
1.0000 mg | Freq: Once | INTRAMUSCULAR | Status: AC
  Administered 2012-11-13: 1 mg via INTRAVENOUS

## 2012-11-13 MED ORDER — ONDANSETRON HCL 4 MG/2ML IJ SOLN
4.0000 mg | Freq: Once | INTRAMUSCULAR | Status: AC
  Administered 2012-11-13: 4 mg via INTRAVENOUS
  Filled 2012-11-13: qty 2

## 2012-11-13 MED ORDER — HYDROMORPHONE HCL PF 1 MG/ML IJ SOLN: 0.5000 mg | Freq: Once | INTRAMUSCULAR | Status: DC

## 2012-11-13 NOTE — MAU Note (Signed)
Pain decreased; nausea decreased; transported by [facility] to [facility] ED; pt's husband to follow [facility]  In private car;

## 2012-11-13 NOTE — Progress Notes (Signed)
I visited with pt and family while making rounds on the unit.  They have a good support system to help them through their grief and they are aware of other resources in the community that they can utilize for support.  Sharmain was in a good deal of pain and did not wish to talk further at this time.    [employer] Pager, [phone] 10:08 AM   11/13/12 1000  Clinical Encounter Type  Visited With Patient and family together  Visit Type Initial

## 2012-11-13 NOTE — MAU Note (Signed)
Pt reports having an ectopic pregnancy. Went to [facility]  And got MTX treatment on Tuesday. Pt reports pain has increased. Reports some dark brown spotting this morning.

## 2012-11-13 NOTE — Telephone Encounter (Signed)
Left message for patient to return my call and medication called in

## 2012-11-13 NOTE — MAU Provider Note (Signed)
History     CSN: [id number]  Arrival date and time: 11/13/12 4627   First Provider Initiated Contact with Patient 11/13/12 [phone]      Chief Complaint  Patient presents with  . Ectopic Pregnancy   HPI [name] is a 36 y.o. female @7 .[redacted] weeks gestation by LMP with ectopic pregnancy. Went to [place] due to patient's medical and surgical history. The ectopic pregnancy is on the right with a heart beat. She received MTX at [facility] 2 days ago. Unsure of Bhcg at that time. Today even with taking demerol for pain the pain is 8/10. The history was provided by the patient. OB History    Grav Para Term Preterm Abortions TAB SAB Ect Mult Living   2 1 0 1 0 0 0 0 0 1       Past Medical History  Diagnosis Date  . Crohn disease 2000    remission  . Asthma     no intubations or ICU stays  . Endometriosis   . Cat allergies   . Dysautonomia     recurrent syncope s/p ILR by Dr Doreatha Lew  . Palpitations   . History of recurrent UTIs   . Chronic nausea     around ovulation time    Past Surgical History  Procedure Date  . Ileocolectomy   . Loop recorder implant 12/10    by DR Doreatha Lew  . Knee surgery 1990    1988  . Cesarean section     2011, emergency  CS due to placental abruption  . Colon surgery 2002    partial removed due to crohns  . Appendectomy   . Tonsillectomy   . US echocardiography 11/11/2009    EF 55-60%    Family History  Problem Relation Age of Onset  . Hypertension Mother   . Hyperlipidemia Mother   . Arthritis Mother   . Diabetes Father   . Coronary artery disease Father   . Heart disease Father 58    MI age 80  . Hyperlipidemia Brother   . Hypertension Brother     History  Substance Use Topics  . Smoking status: Never Smoker   . Smokeless tobacco: Never Used  . Alcohol Use: Yes     Comment: occasionally    Allergies:  Allergies  Allergen Reactions  . Sulfonamide Derivatives Other (See Comments)    Hematemesis; documented while a young child    . Clarithromycin Other (See Comments)    Reacts with chron's disease  . Macrobid (Nitrofurantoin) Nausea And Vomiting  . Codeine Nausea And Vomiting  . Hydrocodone Nausea And Vomiting  . Promethazine Hcl Other (See Comments)    Muscular seizures    Prescriptions prior to admission  Medication Sig Dispense Refill  . acetaminophen (TYLENOL) 500 MG tablet Take 1,000 mg by mouth every 6 (six) hours as needed. For pain      . butalbital-acetaminophen-caffeine (FIORICET, ESGIC) 50-325-40 MG per tablet Take 1 tablet by mouth 2 (two) times daily as needed. For migraines      . cetirizine (ZYRTEC) 10 MG tablet Take 10 mg by mouth daily.      Marland Kitchen dexlansoprazole (DEXILANT) 60 MG capsule Take 60 mg by mouth daily.      Marland Kitchen docusate sodium (COLACE) 100 MG capsule Take 100 mg by mouth 4 (four) times daily as needed. For constipation      . meperidine (DEMEROL) 50 MG tablet Take 50 mg by mouth every 4 (four) hours as needed. For pain      .  ondansetron (ZOFRAN) 8 MG tablet Take by mouth every 8 (eight) hours as needed. For nausea. Alternate with compazine.      . prednisoLONE acetate (PRED FORTE) 1 % ophthalmic suspension 1 drop 4 (four) times daily.      . predniSONE (DELTASONE) 20 MG tablet Take 20 mg by mouth daily.      . prochlorperazine (COMPAZINE) 10 MG tablet Take 10 mg by mouth every 6 (six) hours as needed. For nausea. Alternate with Zofran.      . sertraline (ZOLOFT) 50 MG tablet Take 50 mg by mouth daily.      Marland Kitchen zolpidem (AMBIEN) 10 MG tablet Take 1 tablet (10 mg total) by mouth at bedtime. Scheduled.  30 tablet  1    Review of Systems  Constitutional: Negative for fever and chills.  Eyes: Negative for blurred vision and double vision.  Respiratory: Negative for cough and wheezing.   Cardiovascular: Negative for chest pain.  Gastrointestinal: Positive for nausea and abdominal pain.  Genitourinary: Positive for frequency.  Musculoskeletal: Positive for back pain.  Skin: Negative for rash.   Neurological: Positive for headaches. Negative for dizziness and seizures.   Physical Exam   Blood pressure 117/77, pulse 99, temperature 98.1 F (36.7 C), temperature source Oral, resp. rate 18, height 5' 4.5" (1.638 m), weight 146 lb 6.4 oz (66.407 kg), last menstrual period 09/18/2012.  Physical Exam  Nursing note and vitals reviewed. Constitutional: She is oriented to person, place, and time. She appears well-developed and well-nourished. No distress.       Uncomfortable appearing  HENT:  Head: Normocephalic and atraumatic.  Eyes: EOM are normal.  Neck: Neck supple.  Cardiovascular: Normal rate.   Respiratory: Effort normal.  GI: Soft. There is tenderness in the right lower quadrant.  Musculoskeletal: Normal range of motion.  Neurological: She is alert and oriented to person, place, and time.  Skin: Skin is warm and dry.  Psychiatric: She has a normal mood and affect. Her behavior is normal. Judgment and thought content normal.   ultrasound today shows a 6 week 2 day right complex living ectopic pregnancy, no FF to suggest rupture  Assessment: 36 y.o. female with right ectopic pregnancy s/p MTX with abdominal pain  Plan: MAU Course: Dr. Harrington Challenger notified and will send patient for ultrasound, CBC, Bchg ordered.  Procedures Dr. Harrington Challenger here to evaluate the patient and will transfer to [facility], RN, [place], [facility] 11/13/2012, 9:42 AM

## 2012-11-13 NOTE — Telephone Encounter (Signed)
Verify no suicidal ideation. Increase back to zoloft 50 mg daily.   I will send in some xanax to use prn.  I recommend setting up counsleing if she is interested if she is not already doing.  Have her make appt if mood not improving or if worsening in next few weeks.

## 2012-11-13 NOTE — MAU Provider Note (Signed)
History     CSN: [id number]  Arrival date and time: 11/13/12 4680   First Provider Initiated Contact with Patient 11/13/12 [phone]      Chief Complaint  Patient presents with  . Ectopic Pregnancy   HPI36 yo G2P0101 at 7+6 by LMP diagnosed with a right adnexal ectopic with cardiac activity on 11/11/2012.  Due to her history of 2 prior bowel resections and history of conceiving during a crohn's flair the patient was transferred to [facility] for treatment. It was felt that the appropriate surgical back-up was not available here if the patient became unstable.  She received Methotrexate and was discharged home.  Today she presents to MAU complaining of worsening lower abdominal pain.  A repeat quant HCG was drawn and is 4600 (was 6500 Tuesday) and repeat ultrasound was performed.  Ultrasound shows a persistent viable right sided ectopic with cardiac activity at 126.  No free pelvic fluid is noted today.  Hemoglobin today is 11.3 (was 13 @ Tuesday).    Past Medical History  Diagnosis Date  . Crohn disease 2000    remission  . Asthma     no intubations or ICU stays  . Endometriosis   . Cat allergies   . Dysautonomia     recurrent syncope s/p ILR by Dr Doreatha Lew  . Palpitations   . History of recurrent UTIs   . Chronic nausea     around ovulation time    Past Surgical History  Procedure Date  . Ileocolectomy   . Loop recorder implant 12/10    by DR Doreatha Lew  . Knee surgery 1990    1988  . Cesarean section     2011, emergency  CS due to placental abruption  . Colon surgery 2002    partial removed due to crohns  . Appendectomy   . Tonsillectomy   . US echocardiography 11/11/2009    EF 55-60%    Family History  Problem Relation Age of Onset  . Hypertension Mother   . Hyperlipidemia Mother   . Arthritis Mother   . Diabetes Father   . Coronary artery disease Father   . Heart disease Father 16    MI age 59  . Hyperlipidemia Brother   . Hypertension  Brother     History  Substance Use Topics  . Smoking status: Never Smoker   . Smokeless tobacco: Never Used  . Alcohol Use: Yes     Comment: occasionally    Allergies:  Allergies  Allergen Reactions  . Sulfonamide Derivatives Other (See Comments)    Hematemesis; documented while a young child  . Clarithromycin Other (See Comments)    Reacts with chron's disease  . Macrobid (Nitrofurantoin) Nausea And Vomiting  . Codeine Nausea And Vomiting  . Hydrocodone Nausea And Vomiting  . Promethazine Hcl Other (See Comments)    Muscular seizures    Prescriptions prior to admission  Medication Sig Dispense Refill  . acetaminophen (TYLENOL) 500 MG tablet Take 1,000 mg by mouth every 6 (six) hours as needed. For pain      . butalbital-acetaminophen-caffeine (FIORICET, ESGIC) 50-325-40 MG per tablet Take 1 tablet by mouth 2 (two) times daily as needed. For migraines      . cetirizine (ZYRTEC) 10 MG tablet Take 10 mg by mouth daily.      Marland Kitchen dexlansoprazole (DEXILANT) 60 MG capsule Take 60 mg by mouth daily.      Marland Kitchen docusate sodium (COLACE) 100 MG capsule Take 100 mg by  mouth 4 (four) times daily as needed. For constipation      . meperidine (DEMEROL) 50 MG tablet Take 50 mg by mouth every 4 (four) hours as needed. For pain      . ondansetron (ZOFRAN) 8 MG tablet Take by mouth every 8 (eight) hours as needed. For nausea. Alternate with compazine.      . prednisoLONE acetate (PRED FORTE) 1 % ophthalmic suspension 1 drop 4 (four) times daily.      . predniSONE (DELTASONE) 20 MG tablet Take 20 mg by mouth daily.      . prochlorperazine (COMPAZINE) 10 MG tablet Take 10 mg by mouth every 6 (six) hours as needed. For nausea. Alternate with Zofran.      . sertraline (ZOLOFT) 50 MG tablet Take 50 mg by mouth daily.      Marland Kitchen zolpidem (AMBIEN) 10 MG tablet Take 1 tablet (10 mg total) by mouth at bedtime. Scheduled.  30 tablet  1    ROS: as above Physical Exam   Blood pressure 117/77, pulse 99,  temperature 98.1 F (36.7 C), temperature source Oral, resp. rate 18, height 5' 4.5" (1.638 m), weight 66.407 kg (146 lb 6.4 oz), last menstrual period 09/18/2012.  Physical Exam AOx3, uncomfortable appearing Abd soft, slightly tender to deep palpation.  No rebound, no guarding Pelvic deferred   MAU Course  Procedures: ultrasound   Assessment and Plan  1) Persistent viable right sided ectopic s/p methotrexate on 11/19 with worsening pain.  Will transfer back to [facility] due to this.  Judaea Burgoon H. 11/13/2012, 12:27 PM 36 yo G2P0101 @ 7+6 by LMP who was diagm

## 2012-11-23 HISTORY — PX: LAPAROSCOPY FOR ECTOPIC PREGNANCY: SUR765

## 2012-12-22 ENCOUNTER — Encounter: Payer: PRIVATE HEALTH INSURANCE | Admitting: *Deleted

## 2012-12-23 ENCOUNTER — Other Ambulatory Visit: Payer: Self-pay | Admitting: Family Medicine

## 2012-12-23 MED ORDER — ZOLPIDEM TARTRATE 10 MG PO TABS: 10.0000 mg | ORAL_TABLET | Freq: Every day | ORAL | Status: DC

## 2012-12-23 NOTE — Telephone Encounter (Signed)
Medication phoned to [facility] as instructed.v/m left for pt to ck with pharmacy about refill.

## 2012-12-23 NOTE — Telephone Encounter (Signed)
Pt requesting refill.  Last filled 10/20/12.

## 2012-12-26 ENCOUNTER — Encounter: Payer: Self-pay | Admitting: *Deleted

## 2013-01-23 ENCOUNTER — Encounter: Payer: Self-pay | Admitting: *Deleted

## 2013-02-10 ENCOUNTER — Other Ambulatory Visit: Payer: Self-pay | Admitting: Family Medicine

## 2013-02-10 NOTE — Telephone Encounter (Signed)
Fyi patient is [redacted] weeks pregnant

## 2013-02-11 NOTE — Telephone Encounter (Signed)
rx called to pharmacy 

## 2013-02-18 ENCOUNTER — Other Ambulatory Visit: Payer: Self-pay | Admitting: *Deleted

## 2013-02-18 ENCOUNTER — Other Ambulatory Visit: Payer: Self-pay | Admitting: Family Medicine

## 2013-02-18 NOTE — Telephone Encounter (Signed)
Last filled 01/23/2013

## 2013-02-19 MED ORDER — ZOLPIDEM TARTRATE 10 MG PO TABS: 10.0000 mg | ORAL_TABLET | Freq: Every day | ORAL | Status: DC

## 2013-02-19 NOTE — Telephone Encounter (Signed)
rx called to pharmacy 

## 2013-03-18 ENCOUNTER — Other Ambulatory Visit: Payer: Self-pay | Admitting: *Deleted

## 2013-03-19 MED ORDER — ZOLPIDEM TARTRATE 10 MG PO TABS: 10.0000 mg | ORAL_TABLET | Freq: Every day | ORAL | Status: DC

## 2013-03-19 NOTE — Telephone Encounter (Signed)
RX CALLED TO PHARMACY

## 2013-03-25 ENCOUNTER — Telehealth: Payer: Self-pay | Admitting: Internal Medicine

## 2013-03-25 NOTE — Telephone Encounter (Signed)
0-3-21 certified letter for past due device ck mail rtn, called pt, in hospital in [place] due to problem with pregnancy , will call back when released/mt

## 2013-04-02 DIAGNOSIS — I2510 Atherosclerotic heart disease of native coronary artery without angina pectoris: Secondary | ICD-10-CM

## 2013-04-02 DIAGNOSIS — K5090 Crohn's disease, unspecified, without complications: Secondary | ICD-10-CM

## 2013-04-02 DIAGNOSIS — O211 Hyperemesis gravidarum with metabolic disturbance: Secondary | ICD-10-CM

## 2013-04-02 DIAGNOSIS — Z5181 Encounter for therapeutic drug level monitoring: Secondary | ICD-10-CM

## 2013-04-02 DIAGNOSIS — Z452 Encounter for adjustment and management of vascular access device: Secondary | ICD-10-CM

## 2013-04-13 ENCOUNTER — Encounter: Payer: Self-pay | Admitting: *Deleted

## 2013-04-14 ENCOUNTER — Ambulatory Visit (INDEPENDENT_AMBULATORY_CARE_PROVIDER_SITE_OTHER): Payer: PRIVATE HEALTH INSURANCE | Admitting: *Deleted

## 2013-04-14 DIAGNOSIS — Z111 Encounter for screening for respiratory tuberculosis: Secondary | ICD-10-CM

## 2013-04-16 ENCOUNTER — Encounter: Payer: Self-pay | Admitting: Family Medicine

## 2013-04-16 ENCOUNTER — Ambulatory Visit (INDEPENDENT_AMBULATORY_CARE_PROVIDER_SITE_OTHER): Payer: PRIVATE HEALTH INSURANCE | Admitting: Family Medicine

## 2013-04-16 VITALS — BP 98/60 | HR 100 | Temp 98.5°F | Ht 64.5 in | Wt 154.5 lb

## 2013-04-16 DIAGNOSIS — Z0289 Encounter for other administrative examinations: Secondary | ICD-10-CM

## 2013-04-16 LAB — TB SKIN TEST: Induration: 0 mm

## 2013-04-16 NOTE — Progress Notes (Signed)
  Subjective:    Patient ID: [name], female    DOB: March 07, 1976, 37 y.o.   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 She is currently pregnant [redacted] weeks. Has zofran and IV fluids given though PICC line given severe hyperemesis gravidum.  She has history of chron's followed by GI.  She is doing well with this right now. On no prednisone.  Anxiety, stable control on zoloft and  off alprazolam.  On ambien for insomnia.  Migraines, moderate control: uses fioricet prn.    Review of Systems  Constitutional: Positive for fatigue. Negative for fever and unexpected weight change.  HENT: Negative for ear pain, congestion, sore throat, sneezing, trouble swallowing and sinus pressure.   Eyes: Negative for pain and itching.  Respiratory: Negative for cough, shortness of breath and wheezing.   Cardiovascular: Negative for chest pain, palpitations and leg swelling.  Gastrointestinal: Positive for constipation. Negative for nausea, abdominal pain, diarrhea and blood in stool.       On colace for this  Genitourinary: Negative for dysuria, hematuria, vaginal discharge, difficulty urinating and menstrual problem.  Skin: Negative for rash.  Neurological: Positive for light-headedness. Negative for syncope, weakness, numbness and headaches.  Psychiatric/Behavioral: Negative for confusion and dysphoric mood. The patient is nervous/anxious.        Objective:   Physical Exam  Constitutional: Vital signs are normal. She appears well-developed and well-nourished. She is cooperative.  Non-toxic appearance. She does not appear ill. No distress.  HENT:  Head: Normocephalic.  Right Ear: Hearing, tympanic membrane, external ear and ear canal normal.  Left Ear: Hearing, tympanic membrane, external ear and ear canal normal.  Nose: Nose normal.  Eyes: Conjunctivae, EOM and lids are normal. Pupils are equal, round, and reactive to light. No foreign  bodies found.  Neck: Trachea normal and normal range of motion. Neck supple. Carotid bruit is not present. No mass and no thyromegaly present.  Cardiovascular: Normal rate, regular rhythm, S1 normal, S2 normal, normal heart sounds and intact distal pulses.  Exam reveals no gallop.   No murmur heard. Pulmonary/Chest: Effort normal and breath sounds normal. No respiratory distress. She has no wheezes. She has no rhonchi. She has no rales.  Abdominal: Soft. Normal appearance and bowel sounds are normal. She exhibits no distension, no fluid wave, no abdominal bruit and no mass. There is no hepatosplenomegaly. There is no tenderness. There is no rebound, no guarding and no CVA tenderness. No hernia.  Lymphadenopathy:    She has no cervical adenopathy.    She has no axillary adenopathy.  Neurological: She is alert. She has normal strength. No cranial nerve deficit or sensory deficit.  Skin: Skin is warm, dry and intact. No rash noted.  Psychiatric: Her speech is normal and behavior is normal. Judgment normal. Her mood appears not anxious. Cognition and memory are normal. She does not exhibit a depressed mood.          Assessment & Plan:  The patient's preventative maintenance and recommended screening tests for an annual wellness exam were reviewed in full today. Brought up to date unless services declined.  Counselled on the importance of diet, exercise, and its role in overall health and mortality. The patient's FH and SH was reviewed, including their home life, tobacco status, and drug and alcohol status.

## 2013-05-25 ENCOUNTER — Encounter: Payer: Self-pay | Admitting: Family Medicine

## 2013-05-25 ENCOUNTER — Ambulatory Visit (INDEPENDENT_AMBULATORY_CARE_PROVIDER_SITE_OTHER): Payer: BC Managed Care – PPO | Admitting: Family Medicine

## 2013-05-25 ENCOUNTER — Telehealth: Payer: Self-pay | Admitting: Family Medicine

## 2013-05-25 VITALS — BP 120/70 | HR 108 | Temp 98.8°F | Ht 64.5 in | Wt 150.5 lb

## 2013-05-25 DIAGNOSIS — O0001 Abdominal pregnancy with intrauterine pregnancy: Secondary | ICD-10-CM

## 2013-05-25 DIAGNOSIS — J069 Acute upper respiratory infection, unspecified: Secondary | ICD-10-CM

## 2013-05-25 MED ORDER — AMOXICILLIN 500 MG PO CAPS: 1000.0000 mg | ORAL_CAPSULE | Freq: Two times a day (BID) | ORAL | Status: DC

## 2013-05-25 NOTE — Telephone Encounter (Signed)
i am happy to see.

## 2013-05-25 NOTE — Progress Notes (Signed)
Therapist, music at [facility] [place] [address] Phone: [phone] Fax: [phone]  Date:  05/25/2013   Name:  [name]   DOB:  [birth year]   MRN:  [id number] Gender: female Age: 37 y.o.  Primary Physician:  Eliezer Lofts, MD  Evaluating MD: Owens Loffler, MD   Chief Complaint: Sinusitis   History of Present Illness:  SHATAYA WINKLES is a 37 y.o. pleasant patient who presents with the following:  Crohn's in remission. Throwing up all the time.  Now having a bad cough, throwing up. Had a low-grade fever at lunch.  Nasal congestion, significant cough. Some sinus pressure and pain for 3 days.  Severe nausea and vomitting, seeing MFM at [facility]. Crohn's stable. Overall, does not feel well.   Patient Active Problem List   Diagnosis Date Noted  . Pregnancy 10/21/2012  . Elevated LFTs 10/21/2012  . Hypokalemia 10/09/2012  . Colitis 10/08/2012  . Nausea & vomiting 10/06/2012  . Anxiety 10/06/2012  . BPPV (benign paroxysmal positional vertigo) 03/15/2012  . Crohn's disease 07/27/2011  . Reactive arthritis due to crohn's flares 07/27/2011  . Dysautomnia 07/27/2011  . Asthma, moderate persistent 07/27/2011  . GERD (gastroesophageal reflux disease) 07/27/2011  . Allergic rhinitis 07/27/2011  . Endometriosis 07/27/2011  . Migraine 07/27/2011  . Calcium nephrolithiasis 07/27/2011  . Vertigo 07/27/2011  . Fatigue 07/18/2011  . SYNCOPE, HX OF 11/10/2010    Past Medical History  Diagnosis Date  . Crohn disease 2000    remission  . Asthma     no intubations or ICU stays  . Endometriosis   . Cat allergies   . Dysautonomia     recurrent syncope s/p ILR by Dr Doreatha Lew  . Palpitations   . History of recurrent UTIs   . Chronic nausea     around ovulation time    Past Surgical History  Procedure Laterality Date  . Ileocolectomy    . Loop recorder implant  12/10    by DR Doreatha Lew  . Knee surgery  1990    1988  . Cesarean section      2011,  emergency  CS due to placental abruption  . Colon surgery  2002    partial removed due to crohns  . Appendectomy    . Tonsillectomy    . US echocardiography  11/11/2009    EF 55-60%    History   Social History  . Marital Status: Married    Spouse Name: N/A    Number of Children: N/A  . Years of Education: N/A   Occupational History  .  Other    Airline pilot   Social History Main Topics  . Smoking status: Never Smoker   . Smokeless tobacco: Never Used  . Alcohol Use: Yes     Comment: occasionally  . Drug Use: No  . Sexually Active: Yes   Other Topics Concern  . Not on file   Social History Narrative  . No narrative on file    Family History  Problem Relation Age of Onset  . Hypertension Mother   . Hyperlipidemia Mother   . Arthritis Mother   . Diabetes Father   . Coronary artery disease Father   . Heart disease Father 32    MI age 22  . Hyperlipidemia Brother   . Hypertension Brother     Allergies  Allergen Reactions  . Sulfonamide Derivatives Other (See Comments)    Hematemesis; documented while a young child  .  Clarithromycin Other (See Comments)    Reacts with chron's disease  . Macrobid (Nitrofurantoin) Nausea And Vomiting  . Codeine Nausea And Vomiting  . Hydrocodone Nausea And Vomiting  . Promethazine Hcl Other (See Comments)    Muscular seizures    Medication list has been reviewed and updated.  Outpatient Prescriptions Prior to Visit  Medication Sig Dispense Refill  . acetaminophen (TYLENOL) 500 MG tablet Take 1,000 mg by mouth every 6 (six) hours as needed. For pain      . butalbital-acetaminophen-caffeine (FIORICET, ESGIC) 50-325-40 MG per tablet Take 1 tablet by mouth 2 (two) times daily as needed. For migraines      . cetirizine (ZYRTEC) 10 MG tablet Take 10 mg by mouth daily.      Marland Kitchen dexlansoprazole (DEXILANT) 60 MG capsule Take 60 mg by mouth daily.      Marland Kitchen docusate sodium (COLACE) 100 MG capsule Take 100 mg by mouth 4 (four)  times daily as needed. For constipation      . ondansetron (ZOFRAN) 40 MG/20ML SOLN 2 mg. Infuse 2 mg into the vein once. .75 ml per hour (2 mg per ml)      . ondansetron (ZOFRAN) 8 MG tablet Take by mouth every 8 (eight) hours as needed. For nausea. Alternate with compazine.      . sertraline (ZOLOFT) 50 MG tablet TAKE 1 TABLET (50 MG TOTAL) BY MOUTH DAILY.  30 tablet  2  . zolpidem (AMBIEN) 10 MG tablet Take 1 tablet (10 mg total) by mouth at bedtime.  30 tablet  1   No facility-administered medications prior to visit.    Review of Systems:  ROS: GEN: Acute illness details above GI: Tolerating PO intake - IV zofran right now.  GU: maintaining adequate hydration and urination Pulm: No SOB Interactive and getting along well at home.  Otherwise, ROS is as per the HPI.   Physical Examination: BP 120/70  Pulse 108  Temp(Src) 98.8 F (37.1 C) (Oral)  Ht 5' 4.5" (1.638 m)  Wt 150 lb 8 oz (68.266 kg)  BMI 25.44 kg/m2  SpO2 99%  LMP 09/18/2012  Ideal Body Weight: Weight in (lb) to have BMI = 25: 147.6   Gen: WDWN, NAD; A & O x3, cooperative. Pleasant.Globally Non-toxic HEENT: Normocephalic and atraumatic. Throat clear, w/o exudate, R TM clear, L TM - good landmarks, No fluid present. rhinnorhea.  MMM Frontal sinuses: NT Max sinuses: NT NECK: Anterior cervical  LAD is absent CV: RRR, No M/G/R, cap refill <2 sec PULM: Breathing comfortably in no respiratory distress. no wheezing, crackles, rhonchi EXT: No c/c/e PSYCH: Friendly, good eye contact MSK: Nml gait    Assessment and Plan:  URI (upper respiratory infection)  Abdominal pregnancy with intrauterine pregnancy  Dimetapp if needed and tylenol Should be too early for sinusitis -- hold amox in case worsens  Orders Today:  No orders of the defined types were placed in this encounter.    Updated Medication List: (Includes new medications, updates to list, dose adjustments) Meds ordered this encounter  Medications    . Famotidine (PEPCID IV)    Sig: Inject into the vein.  Marland Kitchen amoxicillin (AMOXIL) 500 MG capsule    Sig: Take 2 capsules (1,000 mg total) by mouth 2 (two) times daily.    Dispense:  40 capsule    Refill:  0    Medications Discontinued: There are no discontinued medications.    Signed, Maud Deed. Libni Fusaro, MD 05/25/2013 4:55 PM

## 2013-05-25 NOTE — Telephone Encounter (Signed)
Patient notified and added to the schedule.

## 2013-05-25 NOTE — Telephone Encounter (Signed)
Patient Information:  Caller Name: Kortlyn  Phone: [phone]  Patient: [name], [name]  Gender: Female  DOB: [birth year]  Age: 37 Years  PCP: Eliezer Lofts (Family Practice)  Pregnant: No  Office Follow Up:  Does the office need to follow up with this patient?: Yes  Instructions For The Office: Please call back ASAP. Pt waiting in parking lot at office for permision to be seen in office.  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  Called from office parking lot requesting appointment. Frequently clearing throat. Nasal mucus is yellow. On on Zofran and IV hydration daily per PICC line. Vomited while coughing at lunch approximately 1115.  Drinking 6 oz fluids daily. History of asthma.  Chest feels tight with hard coughing. Has not used Albuterol and does not know if she has any at home.  Called office nurse now to discuss due to refusing to go to [facility] or ED.  Symptoms  Reason For Call & Symptoms: "Sinus infection" with chest congestion, cough and cough induced vomiting.  G3 P1 ectopic 1 at at 17 weeks; [facility] 11/04/13.  Has PICC line for hyperemesis  Reviewed Health History In EMR: Yes  Reviewed Medications In EMR: Yes  Reviewed Allergies In EMR: Yes  Reviewed Surgeries / Procedures: Yes  Date of Onset of Symptoms: 05/22/2013 OB / GYN:  LMP: 01/28/2013  Guideline(s) Used:  Cough  Asthma Attack  Disposition Per Guideline:   Go to ED Now (or to Office with PCP Approval)  Reason For Disposition Reached:   Patient sounds very sick or weak to the triager  Advice Given:  Quick-Relief Asthma Medicine:   The best "cough medicine" for an adult with asthma is always the asthma medicine (Note: Don't use cough suppressants, but cough drops may help a tickly cough).  Drinking Liquids:  Try to drink normal amount of liquids (e.g., water). Being adequately hydrated makes it easier to cough up the sticky lung mucus.  Humidifier:   If the air is dry, use a cool mist  humidifier to prevent drying of the upper airway.  Call Back If:  You become worse.  Patient Refused Recommendation:  Patient Refused Care Advice  Requesting to be seen in office now without appointment.

## 2013-05-27 ENCOUNTER — Ambulatory Visit: Payer: PRIVATE HEALTH INSURANCE | Admitting: Family Medicine

## 2013-06-10 ENCOUNTER — Encounter: Payer: Self-pay | Admitting: Internal Medicine

## 2013-06-10 ENCOUNTER — Telehealth: Payer: Self-pay | Admitting: Internal Medicine

## 2013-06-10 NOTE — Telephone Encounter (Signed)
06-10-13 LMM @ 1250PM for pt to set up past due pacer check with Brooke/mt

## 2013-06-30 ENCOUNTER — Telehealth (HOSPITAL_COMMUNITY): Payer: Self-pay | Admitting: *Deleted

## 2013-06-30 ENCOUNTER — Encounter (HOSPITAL_COMMUNITY): Payer: Self-pay | Admitting: *Deleted

## 2013-06-30 NOTE — Telephone Encounter (Signed)
Resolve episode

## 2013-07-03 ENCOUNTER — Other Ambulatory Visit: Payer: Self-pay | Admitting: *Deleted

## 2013-07-03 MED ORDER — SERTRALINE HCL 50 MG PO TABS: ORAL_TABLET | ORAL | Status: DC

## 2013-07-03 NOTE — Telephone Encounter (Signed)
Ok to refill 30, 0 ref

## 2013-07-06 MED ORDER — ZOLPIDEM TARTRATE 10 MG PO TABS: 10.0000 mg | ORAL_TABLET | Freq: Every day | ORAL | Status: DC

## 2013-07-06 NOTE — Telephone Encounter (Signed)
rx called to pharmacy 

## 2013-08-06 ENCOUNTER — Encounter: Payer: Self-pay | Admitting: Internal Medicine

## 2013-08-10 ENCOUNTER — Other Ambulatory Visit: Payer: Self-pay | Admitting: Family Medicine

## 2013-08-11 ENCOUNTER — Telehealth: Payer: Self-pay | Admitting: Internal Medicine

## 2013-08-11 ENCOUNTER — Encounter: Payer: Self-pay | Admitting: Internal Medicine

## 2013-08-11 NOTE — Telephone Encounter (Signed)
08-02-24 sent past due certified letter/mt

## 2013-08-11 NOTE — Telephone Encounter (Signed)
Rx called to pharmacy

## 2013-09-03 ENCOUNTER — Telehealth (HOSPITAL_COMMUNITY): Payer: Self-pay | Admitting: Radiology

## 2013-09-03 ENCOUNTER — Telehealth: Payer: Self-pay | Admitting: *Deleted

## 2013-09-03 NOTE — Telephone Encounter (Signed)
After speaking with the RN 4 I have referred the patient to [facility] and also the breast center.

## 2013-09-03 NOTE — Telephone Encounter (Signed)
Patient has called couple days i n a row regarding needing her port flush. I have forwarded her to [facility]. Patient called back today and stated that " Tiffany refereled me to inter. Radiology, who sent her back to [name]." Patient  Stated that " I'm just worried about getting my port flushed tomorrow and my doctor needs to know who to send the orders too." I will follow my with either the RN 4 or nurse director   JMW

## 2013-09-03 NOTE — Telephone Encounter (Signed)
Called patient after talking to [facility].  Patient needs to be an established patient at this practice in order to be set up for port maintenance.  Patient said that she had an appointment set up in [facility] to exchange her needle.

## 2013-09-03 NOTE — Telephone Encounter (Signed)
Pt called wanting to be scheduled in IR to have portacath huber needle exchanged because she is currently driving to a facility further away and would like to start coming to IR in [place].  Informed patient that huber needle exchanges are not scheduled in IR and recommended that she contact the [facility].  Patient stated that she had contacted [facility] and they had transferred her to IR.  I informed patient I would call her back on 09-03-13.

## 2013-09-10 ENCOUNTER — Encounter: Payer: Self-pay | Admitting: Family Medicine

## 2013-09-10 ENCOUNTER — Telehealth: Payer: Self-pay | Admitting: Internal Medicine

## 2013-09-10 ENCOUNTER — Ambulatory Visit (INDEPENDENT_AMBULATORY_CARE_PROVIDER_SITE_OTHER): Payer: BC Managed Care – PPO | Admitting: Family Medicine

## 2013-09-10 VITALS — BP 132/90 | HR 98 | Temp 98.3°F | Ht 64.5 in | Wt 153.5 lb

## 2013-09-10 DIAGNOSIS — O210 Mild hyperemesis gravidarum: Secondary | ICD-10-CM

## 2013-09-10 DIAGNOSIS — J029 Acute pharyngitis, unspecified: Secondary | ICD-10-CM

## 2013-09-10 DIAGNOSIS — Z3493 Encounter for supervision of normal pregnancy, unspecified, third trimester: Secondary | ICD-10-CM

## 2013-09-10 DIAGNOSIS — Z95828 Presence of other vascular implants and grafts: Secondary | ICD-10-CM

## 2013-09-10 DIAGNOSIS — Z9889 Other specified postprocedural states: Secondary | ICD-10-CM

## 2013-09-10 DIAGNOSIS — Z87440 Personal history of urinary (tract) infections: Secondary | ICD-10-CM

## 2013-09-10 DIAGNOSIS — Z331 Pregnant state, incidental: Secondary | ICD-10-CM

## 2013-09-10 DIAGNOSIS — Z8619 Personal history of other infectious and parasitic diseases: Secondary | ICD-10-CM

## 2013-09-10 LAB — POCT RAPID STREP A (OFFICE): Rapid Strep A Screen: NEGATIVE

## 2013-09-10 MED ORDER — MAGIC MOUTHWASH W/LIDOCAINE: 5.0000 mL | Freq: Four times a day (QID) | ORAL | Status: DC | PRN

## 2013-09-10 NOTE — Telephone Encounter (Signed)
Past 30 days sent certified letter 02-13-78/GV

## 2013-09-10 NOTE — Progress Notes (Signed)
Therapist, music at [facility] [place] [address] Phone: [phone] Fax: [phone]  Date:  09/10/2013   Name:  [name]   DOB:  September 30, 1976   MRN:  [id number] Gender: female Age: 37 y.o.  Primary Physician:  Eliezer Lofts, MD  Evaluating MD: Owens Loffler, MD   Chief Complaint: Sore Throat   History of Present Illness:  [name] is a 37 y.o. pleasant patient who presents with the following:  Currently 32 weeks.  Pyelo x 2 weeks of IV ABX Before had sepsis x 2. With 2 hospital admissions at [place]. Now with a zofran infusion and a port-a-cath in place  This week for the last few days, with significant throat pain, hardly able to swallow. No real other URI symptoms. No cough, productive cough.   Patient Active Problem List   Diagnosis Date Noted  . Pregnancy 10/21/2012  . Elevated LFTs 10/21/2012  . Hypokalemia 10/09/2012  . Colitis 10/08/2012  . Nausea & vomiting 10/06/2012  . Anxiety 10/06/2012  . BPPV (benign paroxysmal positional vertigo) 03/15/2012  . Crohn's disease 07/27/2011  . Reactive arthritis due to crohn's flares 07/27/2011  . Dysautomnia 07/27/2011  . Asthma, moderate persistent 07/27/2011  . GERD (gastroesophageal reflux disease) 07/27/2011  . Allergic rhinitis 07/27/2011  . Endometriosis 07/27/2011  . Migraine 07/27/2011  . Calcium nephrolithiasis 07/27/2011  . Vertigo 07/27/2011  . Fatigue 07/18/2011  . SYNCOPE, HX OF 11/10/2010    Past Medical History  Diagnosis Date  . Crohn disease 2000    remission  . Asthma     no intubations or ICU stays  . Endometriosis   . Cat allergies   . Dysautonomia     recurrent syncope s/p ILR by Dr Doreatha Lew  . Palpitations   . History of recurrent UTIs   . Chronic nausea     around ovulation time    Past Surgical History  Procedure Laterality Date  . Ileocolectomy    . Loop recorder implant  12/10    by DR Doreatha Lew  . Knee surgery  1990    1988  . Cesarean  section      2011, emergency  CS due to placental abruption  . Colon surgery  2002    partial removed due to crohns  . Appendectomy    . Tonsillectomy    . US echocardiography  11/11/2009    EF 55-60%    History   Social History  . Marital Status: Married    Spouse Name: N/A    Number of Children: N/A  . Years of Education: N/A   Occupational History  .  Other    Airline pilot   Social History Main Topics  . Smoking status: Never Smoker   . Smokeless tobacco: Never Used  . Alcohol Use: Yes     Comment: occasionally  . Drug Use: No  . Sexual Activity: Yes   Other Topics Concern  . Not on file   Social History Narrative  . No narrative on file    Family History  Problem Relation Age of Onset  . Hypertension Mother   . Hyperlipidemia Mother   . Arthritis Mother   . Diabetes Father   . Coronary artery disease Father   . Heart disease Father 54    MI age 83  . Hyperlipidemia Brother   . Hypertension Brother     Allergies  Allergen Reactions  . Sulfonamide Derivatives Other (See Comments)  Hematemesis; documented while a young child  . Clarithromycin Other (See Comments)    Reacts with chron's disease  . Macrobid [Nitrofurantoin] Nausea And Vomiting  . Codeine Nausea And Vomiting  . Hydrocodone Nausea And Vomiting  . Promethazine Hcl Other (See Comments)    Muscular seizures    Medication list has been reviewed and updated.  Outpatient Prescriptions Prior to Visit  Medication Sig Dispense Refill  . acetaminophen (TYLENOL) 500 MG tablet Take 1,000 mg by mouth every 6 (six) hours as needed. For pain      . butalbital-acetaminophen-caffeine (FIORICET, ESGIC) 50-325-40 MG per tablet Take 1 tablet by mouth 2 (two) times daily as needed. For migraines      . cetirizine (ZYRTEC) 10 MG tablet TAKE ONE (1) TABLET BY MOUTH EVERY DAY  30 tablet  0  . dexlansoprazole (DEXILANT) 60 MG capsule Take 60 mg by mouth daily.      . Famotidine (PEPCID IV) Inject  into the vein.      Marland Kitchen ondansetron (ZOFRAN) 40 MG/20ML SOLN 2 mg. Infuse 2 mg into the vein once. .75 ml per hour (2 mg per ml)      . sertraline (ZOLOFT) 50 MG tablet TAKE 1 TABLET (50 MG TOTAL) BY MOUTH DAILY.  30 tablet  2  . zolpidem (AMBIEN) 10 MG tablet TAKE ONE TABLET BY MOUTH EVERY NIGHT AT BEDTIME  30 tablet  0  . docusate sodium (COLACE) 100 MG capsule Take 100 mg by mouth 4 (four) times daily as needed. For constipation      . ondansetron (ZOFRAN) 8 MG tablet Take by mouth every 8 (eight) hours as needed. For nausea. Alternate with compazine.      Marland Kitchen amoxicillin (AMOXIL) 500 MG capsule Take 2 capsules (1,000 mg total) by mouth 2 (two) times daily.  40 capsule  0   No facility-administered medications prior to visit.    Review of Systems:  ROS: GEN: Acute illness details above GI: Tolerating PO intake - liquids with minimal solid intake GU: maintaining adequate hydration and urination Pulm: No SOB Interactive and getting along well at home.  Otherwise, ROS is as per the HPI.   Physical Examination: BP 132/90  Pulse 98  Temp(Src) 98.3 F (36.8 C) (Oral)  Ht 5' 4.5" (1.638 m)  Wt 153 lb 8 oz (69.627 kg)  BMI 25.95 kg/m2  Ideal Body Weight: Weight in (lb) to have BMI = 25: 147.6   Gen: WDWN, NAD; A & O x3, cooperative. Pleasant.Globally Non-toxic HEENT: Normocephalic and atraumatic. Throat: tonsils appear normal, no exudate R TM clear, L TM - good landmarks, No fluid present. No rhinnorhea. No frontal or maxillary sinus T. MMM NECK: Anterior cervical  LAD is absent CV: RRR, No M/G/R, cap refill <2 sec PULM: Breathing comfortably in no respiratory distress. no wheezing, crackles, rhonchi EXT: No c/c/e PSYCH: Friendly, good eye contact   Assessment and Plan:  Acute pharyngitis - Plan: POCT rapid strep A, Throat culture [facility])  History of pyelonephritis during pregnancy  Third trimester pregnancy  History of sepsis  Hyperemesis arising during  pregnancy  Port-a-cath in place  Rapid strep negative. Complex pregnant patient, check culture.  Given magic mouthwash as well. Most likely viral, but await culture.  Results for orders placed in visit on 09/10/13  POCT RAPID STREP A (OFFICE)      Result Value Range   Rapid Strep A Screen Negative  Negative     Orders Today:  Orders Placed This Encounter  Procedures  . Throat culture [facility])  . POCT rapid strep A    Updated Medication List: (Includes new medications, updates to list, dose adjustments) Meds ordered this encounter  Medications  . Prenatal Vit-Fe Fumarate-FA (MULTIVITAMIN-PRENATAL) 27-0.8 MG TABS tablet    Sig: Take 1 tablet by mouth daily at 12 noon.  . Alum & Mag Hydroxide-Simeth (MAGIC MOUTHWASH W/LIDOCAINE) SOLN    Sig: Take 5 mLs by mouth 4 (four) times daily as needed. Call patient at [phone], she wants flavoring    Dispense:  240 mL    Refill:  3    Medications Discontinued: Medications Discontinued During This Encounter  Medication Reason  . amoxicillin (AMOXIL) 500 MG capsule Completed Course      Signed, Frederico Hamman T. Kian Ottaviano, MD 09/10/2013 6:14 PM

## 2013-09-11 ENCOUNTER — Other Ambulatory Visit: Payer: Self-pay | Admitting: Family Medicine

## 2013-09-11 NOTE — Telephone Encounter (Signed)
Last seen by Dr. Lorelei Pont 09/10/2013.  Ok to refill?

## 2013-09-11 NOTE — Telephone Encounter (Signed)
Ambien called in to [facility].

## 2013-09-12 LAB — CULTURE, GROUP A STREP: Organism ID, Bacteria: NORMAL

## 2013-09-15 ENCOUNTER — Ambulatory Visit (HOSPITAL_COMMUNITY): Payer: BC Managed Care – PPO

## 2013-09-15 ENCOUNTER — Encounter (HOSPITAL_COMMUNITY): Payer: Self-pay

## 2013-09-15 ENCOUNTER — Ambulatory Visit (HOSPITAL_COMMUNITY)
Admission: RE | Admit: 2013-09-15 | Discharge: 2013-09-15 | Disposition: A | Discharge status: Home / Self Care | Payer: BC Managed Care – PPO | Source: Ambulatory Visit | Attending: Maternal and Fetal Medicine | Admitting: Maternal and Fetal Medicine

## 2013-09-15 DIAGNOSIS — R7989 Other specified abnormal findings of blood chemistry: Secondary | ICD-10-CM | POA: Insufficient documentation

## 2013-09-15 DIAGNOSIS — O99891 Other specified diseases and conditions complicating pregnancy: Secondary | ICD-10-CM | POA: Insufficient documentation

## 2013-09-15 DIAGNOSIS — E876 Hypokalemia: Secondary | ICD-10-CM | POA: Insufficient documentation

## 2013-09-15 NOTE — ED Notes (Signed)
BP results discussed with Dr. Lisbeth Renshaw. Dr. Lisbeth Renshaw able to view lab results in [facility] system. Patient instructed by Dr. Lisbeth Renshaw to remain out of work until follow up appt. @ [facility] on Friday, Sept 26.

## 2013-09-16 ENCOUNTER — Other Ambulatory Visit: Payer: Self-pay

## 2013-09-22 ENCOUNTER — Ambulatory Visit (HOSPITAL_COMMUNITY)
Admission: RE | Admit: 2013-09-22 | Discharge: 2013-09-22 | Disposition: A | Discharge status: Home / Self Care | Payer: BC Managed Care – PPO | Source: Ambulatory Visit | Attending: Maternal and Fetal Medicine | Admitting: Maternal and Fetal Medicine

## 2013-09-22 DIAGNOSIS — R7989 Other specified abnormal findings of blood chemistry: Secondary | ICD-10-CM | POA: Insufficient documentation

## 2013-09-22 DIAGNOSIS — O99891 Other specified diseases and conditions complicating pregnancy: Secondary | ICD-10-CM | POA: Insufficient documentation

## 2013-09-22 DIAGNOSIS — E876 Hypokalemia: Secondary | ICD-10-CM | POA: Insufficient documentation

## 2013-09-29 ENCOUNTER — Ambulatory Visit (HOSPITAL_COMMUNITY): Payer: BC Managed Care – PPO

## 2013-10-06 ENCOUNTER — Other Ambulatory Visit (HOSPITAL_COMMUNITY): Payer: BC Managed Care – PPO

## 2013-10-19 ENCOUNTER — Other Ambulatory Visit: Payer: Self-pay | Admitting: Family Medicine

## 2013-10-19 NOTE — Telephone Encounter (Signed)
Last office visit 09/10/2013 with Dr. Lorelei Pont.  Ok to refill?

## 2013-10-20 NOTE — Telephone Encounter (Signed)
Called to [facility].

## 2013-11-26 ENCOUNTER — Encounter (HOSPITAL_COMMUNITY): Payer: Self-pay | Admitting: Emergency Medicine

## 2013-11-26 ENCOUNTER — Other Ambulatory Visit: Payer: Self-pay

## 2013-11-26 ENCOUNTER — Inpatient Hospital Stay (HOSPITAL_COMMUNITY)
Admission: EM | Admit: 2013-11-26 | Discharge: 2013-11-28 | DRG: 392 | Disposition: A | Discharge status: Home / Self Care | Payer: BC Managed Care – PPO | Attending: Internal Medicine | Admitting: Internal Medicine

## 2013-11-26 DIAGNOSIS — R197 Diarrhea, unspecified: Secondary | ICD-10-CM | POA: Diagnosis present

## 2013-11-26 DIAGNOSIS — N390 Urinary tract infection, site not specified: Secondary | ICD-10-CM | POA: Diagnosis present

## 2013-11-26 DIAGNOSIS — Z833 Family history of diabetes mellitus: Secondary | ICD-10-CM

## 2013-11-26 DIAGNOSIS — Z87440 Personal history of urinary (tract) infections: Secondary | ICD-10-CM

## 2013-11-26 DIAGNOSIS — E876 Hypokalemia: Secondary | ICD-10-CM | POA: Diagnosis present

## 2013-11-26 DIAGNOSIS — A088 Other specified intestinal infections: Principal | ICD-10-CM | POA: Diagnosis present

## 2013-11-26 DIAGNOSIS — R112 Nausea with vomiting, unspecified: Secondary | ICD-10-CM

## 2013-11-26 DIAGNOSIS — F329 Major depressive disorder, single episode, unspecified: Secondary | ICD-10-CM | POA: Diagnosis present

## 2013-11-26 DIAGNOSIS — J45909 Unspecified asthma, uncomplicated: Secondary | ICD-10-CM | POA: Diagnosis present

## 2013-11-26 DIAGNOSIS — R000 Tachycardia, unspecified: Secondary | ICD-10-CM

## 2013-11-26 DIAGNOSIS — IMO0002 Reserved for concepts with insufficient information to code with codable children: Secondary | ICD-10-CM

## 2013-11-26 DIAGNOSIS — D649 Anemia, unspecified: Secondary | ICD-10-CM | POA: Diagnosis present

## 2013-11-26 DIAGNOSIS — G43909 Migraine, unspecified, not intractable, without status migrainosus: Secondary | ICD-10-CM | POA: Diagnosis present

## 2013-11-26 DIAGNOSIS — Z79899 Other long term (current) drug therapy: Secondary | ICD-10-CM

## 2013-11-26 DIAGNOSIS — Z8249 Family history of ischemic heart disease and other diseases of the circulatory system: Secondary | ICD-10-CM

## 2013-11-26 DIAGNOSIS — E119 Type 2 diabetes mellitus without complications: Secondary | ICD-10-CM | POA: Diagnosis present

## 2013-11-26 DIAGNOSIS — F3289 Other specified depressive episodes: Secondary | ICD-10-CM | POA: Diagnosis present

## 2013-11-26 DIAGNOSIS — E860 Dehydration: Secondary | ICD-10-CM | POA: Diagnosis present

## 2013-11-26 DIAGNOSIS — K5090 Crohn's disease, unspecified, without complications: Secondary | ICD-10-CM | POA: Diagnosis present

## 2013-11-26 DIAGNOSIS — K219 Gastro-esophageal reflux disease without esophagitis: Secondary | ICD-10-CM | POA: Diagnosis present

## 2013-11-26 LAB — COMPREHENSIVE METABOLIC PANEL
ALT: 22 U/L (ref 0–35)
AST: 22 U/L (ref 0–37)
Albumin: 3.5 g/dL (ref 3.5–5.2)
Alkaline Phosphatase: 86 U/L (ref 39–117)
BUN: 19 mg/dL (ref 6–23)
CO2: 20 mEq/L (ref 19–32)
Calcium: 7.7 mg/dL — ABNORMAL LOW (ref 8.4–10.5)
Chloride: 110 mEq/L (ref 96–112)
Creatinine, Ser: 0.73 mg/dL (ref 0.50–1.10)
GFR calc Af Amer: >90 mL/min (ref >90)
GFR calc non Af Amer: >90 mL/min (ref >90)
Glucose, Bld: 102 mg/dL — ABNORMAL HIGH (ref 70–99)
Potassium: 3.8 mEq/L (ref 3.5–5.1)
Sodium: 142 mEq/L (ref 135–145)
Total Bilirubin: 0.3 mg/dL (ref 0.3–1.2)
Total Protein: 6.7 g/dL (ref 6.0–8.3)

## 2013-11-26 LAB — URINALYSIS, ROUTINE W REFLEX MICROSCOPIC
Bilirubin Urine: NEGATIVE
Glucose, UA: NEGATIVE mg/dL
Hgb urine dipstick: NEGATIVE
Ketones, ur: NEGATIVE mg/dL
Nitrite: NEGATIVE
Protein, ur: NEGATIVE mg/dL
Specific Gravity, Urine: 1.023 (ref 1.005–1.030)
Urobilinogen, UA: 0.2 mg/dL (ref 0.0–1.0)
pH: 5 (ref 5.0–8.0)

## 2013-11-26 LAB — CBC WITH DIFFERENTIAL/PLATELET
Basophils Absolute: 0 10*3/uL (ref 0.0–0.1)
Basophils Relative: 0 % (ref 0–1)
Eosinophils Absolute: 0 10*3/uL (ref 0.0–0.7)
Eosinophils Relative: 0 % (ref 0–5)
HCT: 36.1 % (ref 36.0–46.0)
Hemoglobin: 11.8 g/dL — ABNORMAL LOW (ref 12.0–15.0)
Lymphocytes Relative: 5 % — ABNORMAL LOW (ref 12–46)
Lymphs Abs: 0.7 10*3/uL (ref 0.7–4.0)
MCH: 28.4 pg (ref 26.0–34.0)
MCHC: 32.7 g/dL (ref 30.0–36.0)
MCV: 87 fL (ref 78.0–100.0)
Monocytes Absolute: 0.7 10*3/uL (ref 0.1–1.0)
Monocytes Relative: 5 % (ref 3–12)
Neutro Abs: 13.5 10*3/uL — ABNORMAL HIGH (ref 1.7–7.7)
Neutrophils Relative %: 90 % — ABNORMAL HIGH (ref 43–77)
Platelets: 275 10*3/uL (ref 150–400)
RBC: 4.15 MIL/uL (ref 3.87–5.11)
RDW: 14 % (ref 11.5–15.5)
WBC: 15 10*3/uL — ABNORMAL HIGH (ref 4.0–10.5)

## 2013-11-26 LAB — URINE MICROSCOPIC-ADD ON

## 2013-11-26 MED ORDER — FENTANYL CITRATE 0.05 MG/ML IJ SOLN
75.0000 ug | Freq: Once | INTRAMUSCULAR | Status: AC
  Administered 2013-11-26: 75 ug via INTRAVENOUS
  Filled 2013-11-26: qty 2

## 2013-11-26 MED ORDER — ONDANSETRON HCL 4 MG/2ML IJ SOLN
4.0000 mg | Freq: Once | INTRAMUSCULAR | Status: AC
  Administered 2013-11-26: 4 mg via INTRAVENOUS
  Filled 2013-11-26: qty 2

## 2013-11-26 MED ORDER — SODIUM CHLORIDE 0.9 % IV BOLUS (SEPSIS)
1000.0000 mL | Freq: Once | INTRAVENOUS | Status: AC
  Administered 2013-11-26: 1000 mL via INTRAVENOUS

## 2013-11-26 MED ORDER — FENTANYL CITRATE 0.05 MG/ML IJ SOLN
50.0000 ug | Freq: Once | INTRAMUSCULAR | Status: AC
  Administered 2013-11-27: 50 ug via INTRAVENOUS
  Filled 2013-11-26: qty 2

## 2013-11-26 MED ORDER — SODIUM CHLORIDE 0.9 % IV SOLN
INTRAVENOUS | Status: DC
  Administered 2013-11-26: via INTRAVENOUS

## 2013-11-26 MED ORDER — FENTANYL CITRATE 0.05 MG/ML IJ SOLN
100.0000 ug | Freq: Once | INTRAMUSCULAR | Status: AC
  Administered 2013-11-26: 100 ug via INTRAVENOUS
  Filled 2013-11-26: qty 2

## 2013-11-26 NOTE — ED Notes (Signed)
Pt states she has been having nausea and diarrhea since 1000 this am.  Pt states she has a history of crones and is now only having clear liquid for fecal material.  Pt is breastfeeding.

## 2013-11-26 NOTE — ED Provider Notes (Signed)
CSN: [id number]     Arrival date & time 11/26/13  1934 History   First MD Initiated Contact with Patient 11/26/13 2008     Chief Complaint  Patient presents with  . Emesis  . Diabetes   (Consider location/radiation/quality/duration/timing/severity/associated sxs/prior Treatment) HPI  37 year old female with nausea, vomiting and diarrhea. Symptom onset around 10:00 this morning. Multiple episodes of each. Unable to keep anything down. No fevers or chills. No sick contacts. Patient did eat cookie dough last night. No recent antibiotic usage or significant travel history. No blood in her stool or emesis. Past history significant for Crohn's disease with previous bowel resection. No recent procedures. No pain like she typically has with her crohn's flares.  No urinary complaints.   Past Medical History  Diagnosis Date  . Crohn disease 2000    remission  . Asthma     no intubations or ICU stays  . Endometriosis   . Cat allergies   . Dysautonomia     recurrent syncope s/p ILR by Dr Doreatha Lew  . Palpitations   . History of recurrent UTIs   . Chronic nausea     around ovulation time   Past Surgical History  Procedure Laterality Date  . Ileocolectomy    . Loop recorder implant  12/10    by DR Doreatha Lew  . Knee surgery  1990    1988  . Cesarean section      2011, emergency  CS due to placental abruption  . Colon surgery  2002    partial removed due to crohns  . Appendectomy    . Tonsillectomy    . US echocardiography  11/11/2009    EF 55-60%   Family History  Problem Relation Age of Onset  . Hypertension Mother   . Hyperlipidemia Mother   . Arthritis Mother   . Diabetes Father   . Coronary artery disease Father   . Heart disease Father 46    MI age 85  . Hyperlipidemia Brother   . Hypertension Brother    History  Substance Use Topics  . Smoking status: Never Smoker   . Smokeless tobacco: Never Used  . Alcohol Use: Yes     Comment: occasionally   OB History   Grav  Para Term Preterm Abortions TAB SAB Ect Mult Living   3 1 0 1 1 0 0 1 0 1      Review of Systems  All systems reviewed and negative, other than as noted in HPI.   Allergies  Compazine; Reglan; Sulfonamide derivatives; Clarithromycin; Macrobid; Phenergan; Codeine; Hydrocodone; and Promethazine hcl  Home Medications   Current Outpatient Rx  Name  Route  Sig  Dispense  Refill  . cetirizine (ZYRTEC) 10 MG tablet      TAKE 1 TABLET BY MOUTH DAILY   30 tablet   5   . dexlansoprazole (DEXILANT) 60 MG capsule   Oral   Take 60 mg by mouth daily.         . Mesalamine (DELZICOL) 400 MG CPDR DR capsule   Oral   Take 800 mg by mouth 3 (three) times daily.         . ondansetron (ZOFRAN) 8 MG tablet   Oral   Take 8 mg by mouth every 8 (eight) hours as needed. For nausea. Alternate with compazine.         . predniSONE (DELTASONE) 5 MG tablet   Oral   Take 20 mg by mouth daily.         Marland Kitchen  Prenatal Vit-Fe Fumarate-FA (PRENATAL PO)   Oral   Take 1 tablet by mouth daily.         . sertraline (ZOLOFT) 50 MG tablet      TAKE 1 TABLET (50 MG TOTAL) BY MOUTH DAILY.   30 tablet   2   . zolpidem (AMBIEN) 10 MG tablet      TAKE ONE TABLET BY MOUTH EVERY NIGHT AT BEDTIME   30 tablet   0    BP 137/86  Pulse 131  Temp(Src) 97.7 F (36.5 C) (Oral)  Resp 17  SpO2 100%  LMP 01/28/2013 Physical Exam  Nursing note and vitals reviewed. Constitutional:  Laying in bed bed. Appears ill and pale.   HENT:  Head: Normocephalic and atraumatic.  Eyes: Conjunctivae are normal. Right eye exhibits no discharge. Left eye exhibits no discharge.  Neck: Neck supple.  Cardiovascular: Regular rhythm and normal heart sounds.  Exam reveals no gallop and no friction rub.   No murmur heard. Tachycardia. Port R chest.   Pulmonary/Chest: Effort normal and breath sounds normal. No respiratory distress.  Abdominal: Soft. She exhibits no distension and no mass. There is no tenderness.   Musculoskeletal: She exhibits no edema and no tenderness.  Neurological: She is alert.  Skin: Skin is warm and dry.  Psychiatric: Her behavior is normal. Thought content normal.    ED Course  Procedures (including critical care time) Labs Review Labs Reviewed  CBC WITH DIFFERENTIAL - Abnormal; Notable for the following:    WBC 15.0 (*)    Hemoglobin 11.8 (*)    Neutrophils Relative % 90 (*)    Neutro Abs 13.5 (*)    Lymphocytes Relative 5 (*)    All other components within normal limits  COMPREHENSIVE METABOLIC PANEL - Abnormal; Notable for the following:    Glucose, Bld 102 (*)    Calcium 7.7 (*)    All other components within normal limits  STOOL CULTURE  URINALYSIS, ROUTINE W REFLEX MICROSCOPIC   Imaging Review No results found.  EKG Interpretation   None       MDM   1. Nausea vomiting and diarrhea     37 year old female with nausea, vomiting diarrhea. Patient has a history of Crohn's disease, but she denies any abdominal pain. No abdominal tenderness or distention. Patient remains tachycardic and symptomatic despite several doses of pain medicine, anti-emetics and IV fluids. May be viral illness. Consider salmonella with ingestion of cookie dough yesterday. Will admit for further symptom management.     Virgel Manifold, MD 11/30/13 [phone]

## 2013-11-27 ENCOUNTER — Other Ambulatory Visit: Payer: Self-pay

## 2013-11-27 ENCOUNTER — Encounter (HOSPITAL_COMMUNITY): Payer: Self-pay | Admitting: Emergency Medicine

## 2013-11-27 DIAGNOSIS — K5090 Crohn's disease, unspecified, without complications: Secondary | ICD-10-CM

## 2013-11-27 DIAGNOSIS — R000 Tachycardia, unspecified: Secondary | ICD-10-CM | POA: Diagnosis present

## 2013-11-27 DIAGNOSIS — R112 Nausea with vomiting, unspecified: Secondary | ICD-10-CM

## 2013-11-27 DIAGNOSIS — I498 Other specified cardiac arrhythmias: Secondary | ICD-10-CM

## 2013-11-27 DIAGNOSIS — R197 Diarrhea, unspecified: Secondary | ICD-10-CM

## 2013-11-27 DIAGNOSIS — E876 Hypokalemia: Secondary | ICD-10-CM

## 2013-11-27 DIAGNOSIS — N390 Urinary tract infection, site not specified: Secondary | ICD-10-CM

## 2013-11-27 LAB — COMPREHENSIVE METABOLIC PANEL
ALT: 20 U/L (ref 0–35)
AST: 17 U/L (ref 0–37)
Albumin: 3.1 g/dL — ABNORMAL LOW (ref 3.5–5.2)
Alkaline Phosphatase: 78 U/L (ref 39–117)
Calcium: 7.7 mg/dL — ABNORMAL LOW (ref 8.4–10.5)
Creatinine, Ser: 0.72 mg/dL (ref 0.50–1.10)
Sodium: 138 mEq/L (ref 135–145)
Total Bilirubin: 0.3 mg/dL (ref 0.3–1.2)
Total Protein: 6.1 g/dL (ref 6.0–8.3)

## 2013-11-27 LAB — SEDIMENTATION RATE: Sed Rate: 15 mm/hr (ref 0–22)

## 2013-11-27 LAB — CBC WITH DIFFERENTIAL/PLATELET
Basophils Absolute: 0 10*3/uL (ref 0.0–0.1)
Eosinophils Absolute: 0 10*3/uL (ref 0.0–0.7)
Eosinophils Relative: 0 % (ref 0–5)
HCT: 34.5 % — ABNORMAL LOW (ref 36.0–46.0)
Hemoglobin: 11.2 g/dL — ABNORMAL LOW (ref 12.0–15.0)
Lymphs Abs: 0.7 10*3/uL (ref 0.7–4.0)
MCH: 28.2 pg (ref 26.0–34.0)
MCHC: 32.5 g/dL (ref 30.0–36.0)
MCV: 86.9 fL (ref 78.0–100.0)
Monocytes Absolute: 0.4 10*3/uL (ref 0.1–1.0)
Monocytes Relative: 4 % (ref 3–12)
Neutro Abs: 10 10*3/uL — ABNORMAL HIGH (ref 1.7–7.7)
RBC: 3.97 MIL/uL (ref 3.87–5.11)
RDW: 14.1 % (ref 11.5–15.5)

## 2013-11-27 LAB — PREGNANCY, URINE: Preg Test, Ur: NEGATIVE

## 2013-11-27 LAB — T4, FREE: Free T4: 1.28 ng/dL (ref 0.80–1.80)

## 2013-11-27 MED ORDER — SODIUM CHLORIDE 0.9 % IV SOLN
INTRAVENOUS | Status: AC
  Administered 2013-11-27: 08:00:00 via INTRAVENOUS
  Administered 2013-11-27: 125 mL/h via INTRAVENOUS
  Administered 2013-11-27 – 2013-11-28 (×2): via INTRAVENOUS

## 2013-11-27 MED ORDER — ONDANSETRON HCL 4 MG PO TABS: 4.0000 mg | ORAL_TABLET | Freq: Four times a day (QID) | ORAL | Status: DC | PRN

## 2013-11-27 MED ORDER — ONDANSETRON HCL 4 MG/2ML IJ SOLN
4.0000 mg | Freq: Four times a day (QID) | INTRAMUSCULAR | Status: DC | PRN
  Administered 2013-11-27 – 2013-11-28 (×4): 4 mg via INTRAVENOUS
  Filled 2013-11-27 (×5): qty 2

## 2013-11-27 MED ORDER — ACETAMINOPHEN 325 MG PO TABS
650.0000 mg | ORAL_TABLET | Freq: Four times a day (QID) | ORAL | Status: DC | PRN
  Administered 2013-11-27: 650 mg via ORAL
  Filled 2013-11-27: qty 2

## 2013-11-27 MED ORDER — BUTALBITAL-APAP-CAFFEINE 50-325-40 MG PO TABS: 1.0000 | ORAL_TABLET | Freq: Four times a day (QID) | ORAL | Status: DC | PRN

## 2013-11-27 MED ORDER — ACETAMINOPHEN 650 MG RE SUPP: 650.0000 mg | Freq: Four times a day (QID) | RECTAL | Status: DC | PRN

## 2013-11-27 MED ORDER — FENTANYL CITRATE 0.05 MG/ML IJ SOLN
25.0000 ug | INTRAMUSCULAR | Status: DC | PRN
  Administered 2013-11-27 (×2): 25 ug via INTRAVENOUS
  Filled 2013-11-27 (×2): qty 2

## 2013-11-27 MED ORDER — SACCHAROMYCES BOULARDII 250 MG PO CAPS
250.0000 mg | ORAL_CAPSULE | Freq: Two times a day (BID) | ORAL | Status: DC
  Administered 2013-11-27 – 2013-11-28 (×2): 250 mg via ORAL
  Filled 2013-11-27 (×3): qty 1

## 2013-11-27 MED ORDER — BOOST / RESOURCE BREEZE PO LIQD
1.0000 | Freq: Three times a day (TID) | ORAL | Status: DC
  Administered 2013-11-27 (×3): 1 via ORAL

## 2013-11-27 MED ORDER — ENOXAPARIN SODIUM 40 MG/0.4ML ~~LOC~~ SOLN
40.0000 mg | SUBCUTANEOUS | Status: DC
  Filled 2013-11-27 (×2): qty 0.4

## 2013-11-27 MED ORDER — SODIUM CHLORIDE 0.9 % IJ SOLN
3.0000 mL | Freq: Two times a day (BID) | INTRAMUSCULAR | Status: DC
  Administered 2013-11-27 – 2013-11-28 (×2): 3 mL via INTRAVENOUS

## 2013-11-27 MED ORDER — DEXTROSE 5 % IV SOLN
1.0000 g | Freq: Every day | INTRAVENOUS | Status: DC
  Administered 2013-11-27 (×2): 1 g via INTRAVENOUS
  Filled 2013-11-27 (×4): qty 10

## 2013-11-27 MED ORDER — METHYLPREDNISOLONE SODIUM SUCC 40 MG IJ SOLR
20.0000 mg | Freq: Every day | INTRAMUSCULAR | Status: DC
  Administered 2013-11-27 – 2013-11-28 (×2): 20 mg via INTRAVENOUS
  Filled 2013-11-27 (×2): qty 0.5

## 2013-11-27 NOTE — Consult Note (Signed)
Referring Provider: Dr. Barton Dubois Primary Care Physician:  Eliezer Lofts, MD Primary Gastroenterologist:  Dr. Laurence Spates  Reason for Consultation:  Crohn's disease with acute GI symptoms  HPI: [name] is a 37 y.o. female 9 weeks postpartum from her second child, with long-standing history of Crohn's disease status post ileocecectomy many years ago, no recent GI studies, taken off mercaptopurine in association with pregnancy and breast-feeding, and transitioned to mesalamine and oral steroids postpartum when she started having abdominal discomfort following her delivery.   She was also treated with doxycycline briefly about a month ago for possible small bowel bacterial overgrowth.   On this regimen, with a most recent prednisone dose of 10 mg daily (gradually being tapered), she had been free of symptoms until 10:00 yesterday morning, when she had the fairly abrupt onset of nausea, which progressed to vomiting within several hours, and then watery, nonbloody diarrhea. She has not had fevers or significant abdominal pain. She has been free of vomiting for the past 12 hours or so, but she continues to pass watery fluid per rectum every time she urinates. She was admitted through the emergency room early this morning, and is feeling better at this time, less tachycardic and less dehydrated impaired to when she arrived at the hospital.  A C. difficile toxin assay has come back negative. There are quite a few people sick in town with this sort of illness, which is attributed to a viral gastroenteritis.   Past Medical History  Diagnosis Date  . Crohn disease 2000    remission  . Asthma     no intubations or ICU stays  . Endometriosis   . Cat allergies   . Dysautonomia     recurrent syncope s/p ILR by Dr Doreatha Lew  . Palpitations   . History of recurrent UTIs   . Chronic nausea     around ovulation time    Past Surgical History  Procedure Laterality Date  . Ileocolectomy    . Loop  recorder implant  12/10    by DR Doreatha Lew  . Knee surgery  1990    1988  . Cesarean section      2011, emergency  CS due to placental abruption,2014  . Colon surgery  2002    partial removed due to crohns  . Appendectomy    . Tonsillectomy    . US echocardiography  11/11/2009    EF 55-60%  . [place] surgery  [phone]    due to Ectopic Pregnancy    Prior to Admission medications   Medication Sig Start Date End Date Taking? Authorizing Provider  cetirizine (ZYRTEC) 10 MG tablet TAKE 1 TABLET BY MOUTH DAILY 09/11/13  Yes Amy E Bedsole, MD  dexlansoprazole (DEXILANT) 60 MG capsule Take 60 mg by mouth daily.   Yes Historical Provider, MD  Mesalamine (DELZICOL) 400 MG CPDR DR capsule Take 800 mg by mouth 3 (three) times daily.   Yes Historical Provider, MD  ondansetron (ZOFRAN) 8 MG tablet Take 8 mg by mouth every 8 (eight) hours as needed. For nausea. Alternate with compazine.   Yes Historical Provider, MD  predniSONE (DELTASONE) 5 MG tablet Take 20 mg by mouth daily.   Yes Historical Provider, MD  Prenatal Vit-Fe Fumarate-FA (PRENATAL PO) Take 1 tablet by mouth daily.   Yes Historical Provider, MD  sertraline (ZOLOFT) 50 MG tablet TAKE 1 TABLET (50 MG TOTAL) BY MOUTH DAILY. 07/03/13  Yes Amy Cletis Athens, MD  zolpidem (AMBIEN) 10 MG tablet  TAKE ONE TABLET BY MOUTH EVERY NIGHT AT BEDTIME 10/19/13  Yes Amy Cletis Athens, MD    Current Facility-Administered Medications  Medication Dose Route Frequency Provider Last Rate Last Dose  . 0.9 %  sodium chloride infusion   Intravenous Continuous Rise Patience, MD 125 mL/hr at 11/27/13 1528    . acetaminophen (TYLENOL) tablet 650 mg  650 mg Oral Q6H PRN Rise Patience, MD   650 mg at 11/27/13 1430   Or  . acetaminophen (TYLENOL) suppository 650 mg  650 mg Rectal Q6H PRN Rise Patience, MD      . butalbital-acetaminophen-caffeine (FIORICET, ESGIC) [phone] MG per tablet 1 tablet  1 tablet Oral Q6H PRN Barton Dubois, MD      . cefTRIAXone  (ROCEPHIN) 1 g in dextrose 5 % 50 mL IVPB  1 g Intravenous QHS Rise Patience, MD   1 g at 11/27/13 0207  . enoxaparin (LOVENOX) injection 40 mg  40 mg Subcutaneous Q24H Rise Patience, MD      . feeding supplement (RESOURCE BREEZE) (RESOURCE BREEZE) liquid 1 Container  1 Container Oral TID BM Dagmar Hait, RD   1 Container at 11/27/13 1431  . fentaNYL (SUBLIMAZE) injection 25 mcg  25 mcg Intravenous Q2H PRN Rise Patience, MD   25 mcg at 11/27/13 1128  . methylPREDNISolone sodium succinate (SOLU-MEDROL) 40 mg/mL injection 20 mg  20 mg Intravenous Daily Rise Patience, MD   20 mg at 11/27/13 0206  . ondansetron (ZOFRAN) tablet 4 mg  4 mg Oral Q6H PRN Rise Patience, MD       Or  . ondansetron [facility]) injection 4 mg  4 mg Intravenous Q6H PRN Rise Patience, MD   4 mg at 11/27/13 1131  . saccharomyces boulardii (FLORASTOR) capsule 250 mg  250 mg Oral BID Youlanda Mighty Merlin Golden, MD      . sodium chloride 0.9 % injection 3 mL  3 mL Intravenous Q12H Rise Patience, MD        Allergies as of 11/26/2013 - Review Complete 11/26/2013  Allergen Reaction Noted  . Compazine [prochlorperazine edisylate] Anaphylaxis 09/15/2013  . Reglan [metoclopramide] Anaphylaxis 09/15/2013  . Sulfonamide derivatives Other (See Comments)   . Clarithromycin Other (See Comments) 03/13/2012  . Macrobid [nitrofurantoin] Nausea And Vomiting 10/04/2012  . Phenergan [promethazine hcl]  11/26/2013  . Codeine Nausea And Vomiting   . Hydrocodone Nausea And Vomiting 03/13/2012  . Promethazine hcl Other (See Comments)     Family History  Problem Relation Age of Onset  . Hypertension Mother   . Hyperlipidemia Mother   . Arthritis Mother   . Diabetes Father   . Coronary artery disease Father   . Heart disease Father 61    MI age 66  . Hyperlipidemia Brother   . Hypertension Brother     History   Social History  . Marital Status: Married    Spouse Name: N/A    Number of Children: N/A   . Years of Education: N/A   Occupational History  .  Other    Airline pilot   Social History Main Topics  . Smoking status: Never Smoker   . Smokeless tobacco: Never Used  . Alcohol Use: Yes     Comment: occasionally  . Drug Use: No  . Sexual Activity: Yes   Other Topics Concern  . Not on file   Social History Narrative  . No narrative on file    Review of Systems:  As noted, the patient had been doing well until yesterday morning  Physical Exam: Vital signs in last 24 hours: Temp:  [97.3 F (36.3 C)-98.5 F (36.9 C)] 98.5 F (36.9 C) (12/05 1412) Pulse Rate:  [96-135] 109 (12/05 1412) Resp:  [17-32] 18 (12/05 1412) BP: (115-140)/(71-92) 115/77 mmHg (12/05 1412) SpO2:  [98 %-100 %] 99 % (12/05 1412) Weight:  [65.409 kg (144 lb 3.2 oz)] 65.409 kg (144 lb 3.2 oz) (12/05 0033) Last BM Date: 11/27/13 General:   Alert,  Well-developed, well-nourished, pleasant and cooperative in NAD Head:  Normocephalic and atraumatic. Eyes:  Sclera clear, no icterus.    Lungs:  Clear throughout to auscultation.   No wheezes, crackles, or rhonchi. No evident respiratory distress. Heart:   Regular rate and rhythm; no murmurs, clicks, rubs,  or gallops. Abdomen:  Soft, nontender, nontympanitic, and nondistended. No masses, hepatosplenomegaly or ventral hernias noted. Normal bowel sounds, without bruits, guarding, or rebound.   Msk:   Symmetrical without gross deformities. Neurologic:  Alert and coherent;  grossly normal neurologically. Psych:   Alert and cooperative. Normal mood and affect.  Intake/Output from previous day: 12/04 0701 - 12/05 0700 In: 38 [IV Piggyback:50] Out: 525 [Urine:525] Intake/Output this shift: Total I/O In: 1334.2 [P.O.:480; I.V.:854.2] Out: 801 [Urine:800; Stool:1]  Lab Results:  Recent Labs  11/26/13 2021 11/27/13 0205  WBC 15.0* 11.1*  HGB 11.8* 11.2*  HCT 36.1 34.5*  PLT 275 251   BMET  Recent Labs  11/26/13 2021 11/27/13 0205  NA 142  138  K 3.8 3.6  CL 110 107  CO2 20 19  GLUCOSE 102* 98  BUN 19 17  CREATININE 0.73 0.72  CALCIUM 7.7* 7.7*   LFT  Recent Labs  11/27/13 0205  PROT 6.1  ALBUMIN 3.1*  AST 17  ALT 20  ALKPHOS 78  BILITOT 0.3   PT/INR No results found for this basename: LABPROT, INR,  in the last 72 hours  C-Diff Negative  Studies/Results: No results found.  Impression: I agree with the patient that this is probably a viral gastroenteritis and not a flare up of her Crohn's disease. The abrupt onset of both upper and lower GI tract symptoms, in a patient who's Crohn's disease was well controlled on medical therapy, would be very compatible with a viral illness. The fact that she is improving promptly in the hospital is further evidence of that.  Plan: Continue current management. Add probiotics because she is receiving ceftriaxone for a urinary tract infection in the hospital. As long as she continues to improve, I do not feel that further evaluation such as stool studies for enteric pathogens, or CT scanning looking for evidence of Crohn's inflammation, is necessary.   LOS: 1 day   Sequoya Hogsett V  11/27/2013, 5:02 PM

## 2013-11-27 NOTE — Progress Notes (Signed)
On arrival to the floor and to room 4E13, patient had another episode of nausea and vomiting. Patient stated every move made her want to vomit. After patient vomiting, she wanted to use the bedside commode for she began to have more diarrhea. Stool was all clear and liquidy and not able to distinguish between stool and urine in bedside commode. After using bedside commode, patient began to be more stable. Not able to give Zofran at that time for it had already been given in the ED. Patient oriented to room and call bell system. Husband at the bedside. Patient informed nurse that she is breast feeding and uses breast feeding pump. Mother's milk has been kept on ice at the bedside.   Through the night, new orders were entered for clear liquids. Patient was able to tolerate gingerale with no nausea but zofran was given later in the shift for nausea. Around 5am this morning patient complained of migraine. Patient given Fentanyl per PRN order and was effective. U/A- pregnancy sent to Lab. STAT EKG has been done earlier this morning.  At this time patient is currently resting and oncoming nurse will continue to monitor to end of shift. Nurse signing off at this time.

## 2013-11-27 NOTE — H&P (Addendum)
[facility] History and Physical  [name] NFA:[id number] DOB: September 21, 1976 DOA: 11/26/2013  Referring physician: ER physician. PCP: Owens Loffler, MD  Gastroenterologist - Dr. Oletta Lamas.  Chief Complaint: Nausea vomiting and diarrhea.  HPI: [name] is a 37 y.o. female with history of Crohn's disease and recently was admitted at [facility] 2 months ago for preeclampsia after delivery presents to the ER because of persistent nausea vomiting and diarrhea since yesterday morning. Patient states that she was unable to keep anything and had multiple episodes of diarrhea. Denies any blood in the vomitus or diarrhea. Denies any sick contacts. Denies any fever chills or abdominal pain. On exam patient's abdomen appears benign. In addition patient has significant sinus tachycardia. Patient otherwise denies any chest pain or shortness of breath. Has mild headache. Patient's labs revealed mild anemia but LFTs and creatinine are normal. Patient was recently started back on her Crohn's medications by her gastroenterologist.  Review of Systems: As presented in the history of presenting illness, rest negative.  Past Medical History  Diagnosis Date  . Crohn disease 2000    remission  . Asthma     no intubations or ICU stays  . Endometriosis   . Cat allergies   . Dysautonomia     recurrent syncope s/p ILR by Dr Doreatha Lew  . Palpitations   . History of recurrent UTIs   . Chronic nausea     around ovulation time   Past Surgical History  Procedure Laterality Date  . Ileocolectomy    . Loop recorder implant  12/10    by DR Doreatha Lew  . Knee surgery  1990    1988  . Cesarean section      2011, emergency  CS due to placental abruption,2014  . Colon surgery  2002    partial removed due to crohns  . Appendectomy    . Tonsillectomy    . US echocardiography  11/11/2009    EF 55-60%  . [place] surgery  [phone]    due to Ectopic Pregnancy   Social History:  reports that she has never  smoked. She has never used smokeless tobacco. She reports that she drinks alcohol. She reports that she does not use illicit drugs. Where does patient live home. Can patient participate in ADLs? Yes.  Allergies  Allergen Reactions  . Compazine [Prochlorperazine Edisylate] Anaphylaxis  . Reglan [Metoclopramide] Anaphylaxis  . Sulfonamide Derivatives Other (See Comments)    Hematemesis; documented while a young child  . Clarithromycin Other (See Comments)    Reacts with chron's disease  . Macrobid [Nitrofurantoin] Nausea And Vomiting  . Phenergan [Promethazine Hcl]     "muscle twitches"  . Codeine Nausea And Vomiting  . Hydrocodone Nausea And Vomiting  . Promethazine Hcl Other (See Comments)    Muscular seizures    Family History:  Family History  Problem Relation Age of Onset  . Hypertension Mother   . Hyperlipidemia Mother   . Arthritis Mother   . Diabetes Father   . Coronary artery disease Father   . Heart disease Father 50    MI age 30  . Hyperlipidemia Brother   . Hypertension Brother       Prior to Admission medications   Medication Sig Start Date End Date Taking? Authorizing Provider  cetirizine (ZYRTEC) 10 MG tablet TAKE 1 TABLET BY MOUTH DAILY 09/11/13  Yes Amy E Bedsole, MD  dexlansoprazole (DEXILANT) 60 MG capsule Take 60 mg by mouth daily.   Yes Historical Provider, MD  Mesalamine (DELZICOL) 400 MG CPDR DR capsule Take 800 mg by mouth 3 (three) times daily.   Yes Historical Provider, MD  ondansetron (ZOFRAN) 8 MG tablet Take 8 mg by mouth every 8 (eight) hours as needed. For nausea. Alternate with compazine.   Yes Historical Provider, MD  predniSONE (DELTASONE) 5 MG tablet Take 20 mg by mouth daily.   Yes Historical Provider, MD  Prenatal Vit-Fe Fumarate-FA (PRENATAL PO) Take 1 tablet by mouth daily.   Yes Historical Provider, MD  sertraline (ZOLOFT) 50 MG tablet TAKE 1 TABLET (50 MG TOTAL) BY MOUTH DAILY. 07/03/13  Yes Amy Cletis Athens, MD  zolpidem (AMBIEN) 10 MG  tablet TAKE ONE TABLET BY MOUTH EVERY NIGHT AT BEDTIME 10/19/13  Yes Amy Cletis Athens, MD    Physical Exam: Filed Vitals:   11/26/13 2002 11/26/13 2145 11/26/13 2332 11/27/13 0033  BP:  137/86 129/82 132/87  Pulse:  131 131 135  Temp:   98.1 F (36.7 C) 97.3 F (36.3 C)  TempSrc:   Oral Oral  Resp:  17 24 20   Height:    5' 4"  (1.626 m)  Weight:    65.409 kg (144 lb 3.2 oz)  SpO2: 100% 100% 100% 100%     General:  Well-developed well-nourished.  Eyes: Anicteric no pallor.  ENT: No discharge from ears eyes nose mouth.  Neck: No mass felt.  Cardiovascular: S1-S2 heard. Tachycardic. Regular.  Respiratory: No rhonchi or crepitations.  Abdomen: Soft nontender bowel sounds present.  Skin: No rash.  Musculoskeletal: No edema.  Psychiatric: Appears normal.   Neurologic:  alert awake oriented to time place and person. Moves all extremities.   Labs on Admission:  Basic Metabolic Panel:  Recent Labs Lab 11/26/13 2021 11/27/13 0205  NA 142 138  K 3.8 3.6  CL 110 107  CO2 20 19  GLUCOSE 102* 98  BUN 19 17  CREATININE 0.73 0.72  CALCIUM 7.7* 7.7*   Liver Function Tests:  Recent Labs Lab 11/26/13 2021 11/27/13 0205  AST 22 17  ALT 22 20  ALKPHOS 86 78  BILITOT 0.3 0.3  PROT 6.7 6.1  ALBUMIN 3.5 3.1*   No results found for this basename: LIPASE, AMYLASE,  in the last 168 hours No results found for this basename: AMMONIA,  in the last 168 hours CBC:  Recent Labs Lab 11/26/13 2021 11/27/13 0205  WBC 15.0* 11.1*  NEUTROABS 13.5* 10.0*  HGB 11.8* 11.2*  HCT 36.1 34.5*  MCV 87.0 86.9  PLT 275 251   Cardiac Enzymes: No results found for this basename: CKTOTAL, CKMB, CKMBINDEX, TROPONINI,  in the last 168 hours  BNP (last 3 results) No results found for this basename: PROBNP,  in the last 8760 hours CBG: No results found for this basename: GLUCAP,  in the last 168 hours  Radiological Exams on Admission: No results  found.   Assessment/Plan Principal Problem:   Nausea vomiting and diarrhea Active Problems:   Crohn's disease   Sinus tachycardia   1. Nausea vomiting and diarrhea - suspect viral gastroenteritis. Patient does have history of Crohn's. May consult patient's GI doctor Edwards in a.m. Patient's LFTs platelets are normal so less likely to be anything associated with her recent pregnancy like HELLP syndrome, fatty liver of pregnancy and since blood pressure is normal likely to be preeclampsia related. For now I will keep patient n.p.o. and continue with hydration. Continue with IV steroids instead of by mouth steroids patient is on due to nausea vomiting. Patient's  Mesalamine is on hold due to her nausea vomiting. Check urine pregnancy. 2. Sinus tachycardia  - may be related to the acute condition. But since tachycardia significant we will check for hyperthyroidism withThyroid function test. Check EKG. 3. Headache  - probably secondary to nausea and vomiting. Patient does have history of migraines. Patient for now will be placed on IV fentanyl when necessary.  4. Mild anemia  - closely follow CBC.   Patient is breast-feeding and I have confirmed with pharmacy about the safety of medications prescribed   Code Status: Full code.  Family Communication: Patient's husband at the bedside.   Disposition Plan:  admit to inpatient.     Adamary Savary N. [facility] Pager [phone].  If 7PM-7AM, please contact night-coverage [email or web address] Password [facility] 11/27/2013, 3:48 AM

## 2013-11-27 NOTE — Progress Notes (Signed)
Utilization review completed. Hassen Bruun, RN, BSN. 

## 2013-11-27 NOTE — Progress Notes (Signed)
INITIAL NUTRITION ASSESSMENT  DOCUMENTATION CODES Per approved criteria  -Not Applicable   INTERVENTION: Resource Breeze po TID, each supplement provides 250 kcal and 9 grams of protein.  NUTRITION DIAGNOSIS: Inadequate oral intake related to nausea, vomiting, and diarrhea as evidenced by reported intake less than estimated needs.   Goal: Pt to meet >/= 90% of their estimated nutrition needs   Monitor:  Wt, po intake, acceptance of supplements, breast milk production  Reason for Assessment: MST  37 y.o. female  Admitting Dx: Nausea vomiting and diarrhea  ASSESSMENT: 37 y.o. female with history of Crohn's disease and recently was admitted at [facility] 2 months ago for preeclampsia after delivery presents to the ER because of persistent nausea vomiting and diarrhea since yesterday morning (12/4).  Pt reports no recent wt loss or wt gain. She has had a decrease in breast milk production due to not being able to eat or breast feed her baby. Pt was pumping during RD visit. She said that she would try nutritional supplements to provide, calories, protein, and fluids for breast milk production.  Height: Ht Readings from Last 1 Encounters:  11/27/13 5' 4"  (1.626 m)    Weight: Wt Readings from Last 1 Encounters:  11/27/13 144 lb 3.2 oz (65.409 kg)    Ideal Body Weight: 54.7 kg  % Ideal Body Weight: 120%  Wt Readings from Last 10 Encounters:  11/27/13 144 lb 3.2 oz (65.409 kg)  09/22/13 156 lb (70.761 kg)  09/15/13 153 lb (69.4 kg)  09/10/13 153 lb 8 oz (69.627 kg)  05/25/13 150 lb 8 oz (68.266 kg)  04/16/13 154 lb 8 oz (70.081 kg)  11/13/12 146 lb 6.4 oz (66.407 kg)  10/21/12 147 lb 8 oz (66.906 kg)  10/06/12 153 lb 3.5 oz (69.5 kg)  10/04/12 145 lb 6.4 oz (65.953 kg)    BMI:  Body mass index is 24.74 kg/(m^2).  Estimated Nutritional Needs: Kcal: 4315-4008 Protein: 70-80 g/day Fluid: 2.4-2.6 L/day  Skin: WNL  Diet Order: Clear Liquid  EDUCATION NEEDS: -No  education needs identified at this time   Intake/Output Summary (Last 24 hours) at 11/27/13 1036 Last data filed at 11/27/13 1003  Gross per 24 hour  Intake 904.17 ml  Output    826 ml  Net  78.17 ml    Last BM: 12/5   Labs:   Recent Labs Lab 11/26/13 2021 11/27/13 0205  NA 142 138  K 3.8 3.6  CL 110 107  CO2 20 19  BUN 19 17  CREATININE 0.73 0.72  CALCIUM 7.7* 7.7*  GLUCOSE 102* 98    CBG (last 3)  No results found for this basename: GLUCAP,  in the last 72 hours  Scheduled Meds: . cefTRIAXone (ROCEPHIN)  IV  1 g Intravenous QHS  . enoxaparin (LOVENOX) injection  40 mg Subcutaneous Q24H  . methylPREDNISolone (SOLU-MEDROL) injection  20 mg Intravenous Daily  . sodium chloride  3 mL Intravenous Q12H    Continuous Infusions: . sodium chloride 125 mL/hr at 11/27/13 6761    Past Medical History  Diagnosis Date  . Crohn disease 2000    remission  . Asthma     no intubations or ICU stays  . Endometriosis   . Cat allergies   . Dysautonomia     recurrent syncope s/p ILR by Dr Doreatha Lew  . Palpitations   . History of recurrent UTIs   . Chronic nausea     around ovulation time    Past Surgical History  Procedure Laterality Date  . Ileocolectomy    . Loop recorder implant  12/10    by DR Doreatha Lew  . Knee surgery  1990    1988  . Cesarean section      2011, emergency  CS due to placental abruption,2014  . Colon surgery  2002    partial removed due to crohns  . Appendectomy    . Tonsillectomy    . US echocardiography  11/11/2009    EF 55-60%  . Laprascopic surgery  [id number]    due to Ectopic Pregnancy    Terrace Arabia RD, LDN

## 2013-11-27 NOTE — Progress Notes (Signed)
Patient admitted after midnight secondary to nausea, vomiting, diarrhea. Had hx of crhon's disease. Currently feeling better and no further vomiting. Follow admission recommendations and hx of crohn's GI will be consulted. Work up also suggested UTI, following urin cx's and treating patient with rocephin. Please referred to H&P by Dr. Hal Hope for further info/details on admisison.  Plan: -continue IV steroids, will check ESR -GI consulted and will follow rec's; especially for rec's on CT needs -slowly advance diet as tolerated -continue IVF's and electrolytes repletion -continue rocephin and follow urine cultures -continue PRN antiemetics   [name] [phone]

## 2013-11-28 DIAGNOSIS — K219 Gastro-esophageal reflux disease without esophagitis: Secondary | ICD-10-CM

## 2013-11-28 LAB — BASIC METABOLIC PANEL
CO2: 22 mEq/L (ref 19–32)
Calcium: 7.8 mg/dL — ABNORMAL LOW (ref 8.4–10.5)
Chloride: 112 mEq/L (ref 96–112)
Glucose, Bld: 101 mg/dL — ABNORMAL HIGH (ref 70–99)
Sodium: 143 mEq/L (ref 135–145)

## 2013-11-28 LAB — URINE CULTURE: Colony Count: 9000

## 2013-11-28 MED ORDER — SACCHAROMYCES BOULARDII 250 MG PO CAPS: 250.0000 mg | ORAL_CAPSULE | Freq: Two times a day (BID) | ORAL | Status: DC

## 2013-11-28 MED ORDER — ONDANSETRON HCL 8 MG PO TABS: 8.0000 mg | ORAL_TABLET | Freq: Three times a day (TID) | ORAL | Status: DC | PRN

## 2013-11-28 MED ORDER — CEFUROXIME AXETIL 500 MG PO TABS: 500.0000 mg | ORAL_TABLET | Freq: Two times a day (BID) | ORAL | Status: AC

## 2013-11-28 MED ORDER — POTASSIUM CHLORIDE CRYS ER 20 MEQ PO TBCR
40.0000 meq | EXTENDED_RELEASE_TABLET | ORAL | Status: AC
  Administered 2013-11-28 (×2): 40 meq via ORAL
  Filled 2013-11-28 (×2): qty 2

## 2013-11-28 NOTE — Progress Notes (Signed)
Reviewed all discharge instructions.  Stated understanding of all instructions. Prescriptions given x 3.  Stated understanding to make f/u appt with PCP within 1 week.  No voiced complaints.

## 2013-11-28 NOTE — Discharge Summary (Signed)
Physician Discharge Summary  [name] ZWC:[id number] DOB: 07-31-1976 DOA: 11/26/2013  PCP: Eliezer Lofts, MD  Admit date: 11/26/2013 Discharge date: 11/28/2013  Time spent: >30 minutes  Recommendations for Outpatient Follow-up:  BMET to follow electrolytes and renal function.  Discharge Diagnoses:  Principal Problem:   Nausea vomiting and diarrhea Active Problems:   Crohn's disease   Sinus tachycardia hypokalemia Dehydration GERD Depression UTI  Discharge Condition: stable and improved. Discharged home and advised to follow with PCP in 1 week.   Filed Weights   11/27/13 0033 11/28/13 2353  Weight: 65.409 kg (144 lb 3.2 oz) 66 kg (145 lb 8.1 oz)    History of present illness:  37 y.o. female with history of Crohn's disease and recently was admitted at [facility] 2 months ago for preeclampsia after delivery presents to the ER because of persistent nausea vomiting and diarrhea since yesterday morning. Patient states that she was unable to keep anything and had multiple episodes of diarrhea. Denies any blood in the vomitus or diarrhea. Denies any sick contacts. Denies any fever chills or abdominal pain. On exam patient's abdomen appears benign. In addition patient has significant sinus tachycardia. Patient otherwise denies any chest pain or shortness of breath. Has mild headache. Patient's labs revealed mild anemia but LFTs and creatinine are normal. Patient was recently started back on her Crohn's medications by her gastroenterologist.   Hospital Course:  1-nausea, vomiting and diarrhea: secondary to viral gastroenteritis. -neg C. Diff -WNL ESR -patient with good response to supportive care -at discharge electrolytes repleted and WNL.  2-Dehydration and hypokalemia: due to vomiting and diarrhea. Patient also actively breast feeding. -resolved with IVF's resuscitation and electrolytes repletion  3-tachycardia: due to dehydration. Sinus on telemetry observation. -resolved with  IVF's -patient with loop recorder and will follow with cardiology for further evaluation as they are studying her for dysautonomia -TSH, free T4 and T3 WNL.  4-Crohn's disease: ESR WNL. Per GI rec's no need for CT scan. -continue prednisone and mesalamine -probiotics ordered as she will be on abx's for UTI  5-Presumed UTI: no microorganism isolated and patient started on abx's before cx taken. -at discharge no dysuria, WBC's practically WNL and no fever -patient will complete treatment as instructed with ceftin BID  6-GERD: continue PPI  7-Hx of migraines: continue PRN fioricet  8-depression: stable. Continue zoloft  Procedures: None   Consultations:  GI (Dr. Cristina Gong)  Discharge Exam: Filed Vitals:   11/28/13 0632  BP: 119/82  Pulse: 94  Temp: 98.1 F (36.7 C)  Resp: 18    General: NAD, afebrile, no active vomiting and just mild loose stool (according to patient as usual for her crohn's)  Cardiovascular: S1 and S2, no rubs or gallops; regular rate Respiratory: CTA bilaterally Abdomen: soft, NT, ND, positive BS Extremities: no edema, no cyanosis or clubbing Neuro: no focal deficit.   Discharge Instructions  Discharge Orders   Future Orders Complete By Expires   Discharge instructions  As directed    Comments:     Keep yourself well hydrated Take medications as prescribed Arrange follow up with PCP in 1 week       Medication List         cefUROXime 500 MG tablet  Commonly known as:  CEFTIN  Take 1 tablet (500 mg total) by mouth 2 (two) times daily with a meal.     cetirizine 10 MG tablet  Commonly known as:  ZYRTEC  TAKE 1 TABLET BY MOUTH DAILY  DELZICOL 400 MG Cpdr DR capsule  Generic drug:  Mesalamine  Take 800 mg by mouth 3 (three) times daily.     dexlansoprazole 60 MG capsule  Commonly known as:  DEXILANT  Take 60 mg by mouth daily.     ondansetron 8 MG tablet  Commonly known as:  ZOFRAN  Take 1 tablet (8 mg total) by mouth every 8  (eight) hours as needed. For nausea. Alternate with compazine.     predniSONE 5 MG tablet  Commonly known as:  DELTASONE  Take 20 mg by mouth daily.     PRENATAL PO  Take 1 tablet by mouth daily.     saccharomyces boulardii 250 MG capsule  Commonly known as:  FLORASTOR  Take 1 capsule (250 mg total) by mouth 2 (two) times daily.     sertraline 50 MG tablet  Commonly known as:  ZOLOFT  TAKE 1 TABLET (50 MG TOTAL) BY MOUTH DAILY.     zolpidem 10 MG tablet  Commonly known as:  AMBIEN  TAKE ONE TABLET BY MOUTH EVERY NIGHT AT BEDTIME       Allergies  Allergen Reactions  . Compazine [Prochlorperazine Edisylate] Anaphylaxis  . Reglan [Metoclopramide] Anaphylaxis  . Sulfonamide Derivatives Other (See Comments)    Hematemesis; documented while a young child  . Clarithromycin Other (See Comments)    Reacts with chron's disease  . Macrobid [Nitrofurantoin] Nausea And Vomiting  . Phenergan [Promethazine Hcl]     "muscle twitches"  . Codeine Nausea And Vomiting  . Hydrocodone Nausea And Vomiting  . Promethazine Hcl Other (See Comments)    Muscular seizures       Follow-up Information   Follow up with Eliezer Lofts, MD. Schedule an appointment as soon as possible for a visit in 1 week.   Specialty:  Family Medicine   Contact information:   [place] [address] [phone]       The results of significant diagnostics from this hospitalization (including imaging, microbiology, ancillary and laboratory) are listed below for reference.    Significant Diagnostic Studies: No results found.  Microbiology: Recent Results (from the past 240 hour(s))  CULTURE, BLOOD (ROUTINE X 2)     Status: None   Collection Time    11/27/13  2:05 AM      Result Value Range Status   Specimen Description BLOOD LEFT ARM   Final   Special Requests BOTTLES DRAWN AEROBIC ONLY 5CC   Final   Culture  Setup Time     Final   Value: 11/27/2013 09:02      Performed at [facility]   Culture     Final   Value:        BLOOD CULTURE RECEIVED NO GROWTH TO DATE CULTURE WILL BE HELD FOR 5 DAYS BEFORE ISSUING A FINAL NEGATIVE REPORT     Performed at [facility]   Report Status PENDING   Incomplete  CULTURE, BLOOD (ROUTINE X 2)     Status: None   Collection Time    11/27/13  2:10 AM      Result Value Range Status   Specimen Description BLOOD LEFT HAND   Final   Special Requests BOTTLES DRAWN AEROBIC AND ANAEROBIC [facility] EACH   Final   Culture  Setup Time     Final   Value: 11/27/2013 09:02     Performed at [facility]  Final   Value:        BLOOD CULTURE RECEIVED NO GROWTH TO DATE CULTURE WILL BE HELD FOR 5 DAYS BEFORE ISSUING A FINAL NEGATIVE REPORT     Performed at [facility]   Report Status PENDING   Incomplete  CLOSTRIDIUM DIFFICILE BY PCR     Status: None   Collection Time    11/27/13 10:05 AM      Result Value Range Status   C difficile by pcr NEGATIVE  NEGATIVE Final     Labs: Basic Metabolic Panel:  Recent Labs Lab 11/26/13 2021 11/27/13 0205 11/28/13 0348  NA 142 138 143  K 3.8 3.6 3.3*  CL 110 107 112  CO2 20 19 22   GLUCOSE 102* 98 101*  BUN 19 17 7   CREATININE 0.73 0.72 0.69  CALCIUM 7.7* 7.7* 7.8*   Liver Function Tests:  Recent Labs Lab 11/26/13 2021 11/27/13 0205  AST 22 17  ALT 22 20  ALKPHOS 86 78  BILITOT 0.3 0.3  PROT 6.7 6.1  ALBUMIN 3.5 3.1*   CBC:  Recent Labs Lab 11/26/13 2021 11/27/13 0205  WBC 15.0* 11.1*  NEUTROABS 13.5* 10.0*  HGB 11.8* 11.2*  HCT 36.1 34.5*  MCV 87.0 86.9  PLT 275 251    Signed:  Alfonse Garringer  [facility] 11/28/2013, 11:40 AM

## 2013-11-28 NOTE — Progress Notes (Signed)
IV d/c'd per protocol per MD stated pt for discharge today and is writing d/c orders.

## 2013-11-28 NOTE — Progress Notes (Signed)
[facility] Progress Note  Subjective: No more nausea, still having some diarrhea, tolerated supper last night and breakfast this morning  Objective: Vital signs in last 24 hours: Temp:  [97.3 F (36.3 C)-98.5 F (36.9 C)] 98.1 F (36.7 C) (12/06 0940) Pulse Rate:  [71-109] 94 (12/06 0632) Resp:  [18] 18 (12/06 7680) BP: (115-125)/(72-82) 119/82 mmHg (12/06 0632) SpO2:  [99 %-100 %] 100 % (12/06 8811) Weight:  [66 kg (145 lb 8.1 oz)] 66 kg (145 lb 8.1 oz) (12/06 0315) Weight change: 0.591 kg (1 lb 4.9 oz)   PE: Unchanged  Lab Results: Results for orders placed during the hospital encounter of 11/26/13 (from the past 24 hour(s))  CLOSTRIDIUM DIFFICILE BY PCR     Status: None   Collection Time    11/27/13 10:05 AM      Result Value Range   C difficile by pcr NEGATIVE  NEGATIVE  BASIC METABOLIC PANEL     Status: Abnormal   Collection Time    11/28/13  3:48 AM      Result Value Range   Sodium 143  135 - 145 mEq/L   Potassium 3.3 (*) 3.5 - 5.1 mEq/L   Chloride 112  96 - 112 mEq/L   CO2 22  19 - 32 mEq/L   Glucose, Bld 101 (*) 70 - 99 mg/dL   BUN 7  6 - 23 mg/dL   Creatinine, Ser 0.69  0.50 - 1.10 mg/dL   Calcium 7.8 (*) 8.4 - 10.5 mg/dL   GFR calc non Af Amer >90  >90 mL/min   GFR calc Af Amer >90  >90 mL/min    Studies/Results: No results found.    Assessment: Probable viral gastroenteritis superimposed upon Crohn's disease  Plan: Patient already improved for discharge today. Will continue asacol on discharge. Use Imodium judiciously when necessary for diarrhea. She is followup with Dr. Oletta Lamas in one month and I told her to move that up if her symptoms did not returned to baseline.    Beth Spackman C 11/28/2013, 7:47 AM

## 2013-11-29 ENCOUNTER — Telehealth: Payer: Self-pay | Admitting: Family Medicine

## 2013-11-29 NOTE — Telephone Encounter (Signed)
Please contact pt for recent hospitalization and dicscharge on 12/6

## 2013-11-30 LAB — STOOL CULTURE

## 2013-12-02 NOTE — Telephone Encounter (Signed)
Appt scheduled for 12/12 at 12:00

## 2013-12-03 LAB — CULTURE, BLOOD (ROUTINE X 2)

## 2013-12-04 ENCOUNTER — Ambulatory Visit (INDEPENDENT_AMBULATORY_CARE_PROVIDER_SITE_OTHER): Payer: BC Managed Care – PPO | Admitting: Family Medicine

## 2013-12-04 ENCOUNTER — Encounter: Payer: Self-pay | Admitting: Family Medicine

## 2013-12-04 VITALS — BP 100/80 | HR 84 | Temp 98.2°F | Ht 64.5 in | Wt 146.2 lb

## 2013-12-04 DIAGNOSIS — R197 Diarrhea, unspecified: Secondary | ICD-10-CM

## 2013-12-04 DIAGNOSIS — E876 Hypokalemia: Secondary | ICD-10-CM

## 2013-12-04 DIAGNOSIS — R112 Nausea with vomiting, unspecified: Secondary | ICD-10-CM

## 2013-12-04 LAB — BASIC METABOLIC PANEL
BUN: 19 mg/dL (ref 6–23)
CO2: 24 mEq/L (ref 19–32)
Calcium: 8.8 mg/dL (ref 8.4–10.5)
Chloride: 106 mEq/L (ref 96–112)
Creatinine, Ser: 0.9 mg/dL (ref 0.4–1.2)
GFR: 77.49 mL/min (ref >60.00)
Glucose, Bld: 90 mg/dL (ref 70–99)
Potassium: 3.5 mEq/L (ref 3.5–5.1)
Sodium: 140 mEq/L (ref 135–145)

## 2013-12-04 NOTE — Assessment & Plan Note (Signed)
Due to viral GE. Now resolved. Re-eval electrolytes now as she is breast feeding.

## 2013-12-04 NOTE — Progress Notes (Signed)
Subjective:    Patient ID: [name], female    DOB: 1976-03-23, 37 y.o.   MRN: [id number]  HPI  37 year old female with chron's disease presents for hospital follow up. She was admitted on 12/4 to 12/6 for N/V/ Diarrhea. Patient's labs revealed mild anemia but LFTs and creatinine are normal. Patient was recently started back on her Crohn's medications by her gastroenterologist.   1-nausea, vomiting and diarrhea: secondary to viral gastroenteritis.  -neg C. Diff  -WNL ESR  -patient with good response to supportive care  -at discharge electrolytes repleted and WNL.   2-Dehydration and hypokalemia: due to vomiting and diarrhea. Patient also actively breast feeding.  -resolved with IVF's resuscitation and electrolytes repletion   3-tachycardia: due to dehydration. Sinus on telemetry observation.  -resolved with IVF's  -patient with loop recorder and will follow with cardiology for further evaluation as they are studying her for dysautonomia  -TSH, free T4 and T3 WNL.   4-Crohn's disease: ESR WNL. Per GI rec's no need for CT scan.  -continue prednisone and mesalamine  -probiotics ordered as she will be on abx's for UTI   5-Presumed UTI: no microorganism isolated and patient started on abx's before cx taken.  -at discharge no dysuria, WBC's practically WNL and no fever  -patient will complete treatment as instructed with ceftin BID     Since she has been home she has been doing better. She is breast feeding successfully. She is keeping down fluids. Weakness is improving. She has been drinking a lot of gatorade. No dysuria at this time. Has completed ceftin yesterday for presumed UTI.     Review of Systems  Constitutional: Negative for fever and fatigue.  HENT: Negative for ear pain.   Eyes: Negative for pain.  Respiratory: Negative for chest tightness and shortness of breath.   Cardiovascular: Negative for chest pain, palpitations and leg swelling.  Gastrointestinal:  Negative for abdominal pain.  Genitourinary: Negative for dysuria.       Objective:   Physical Exam  Constitutional: Vital signs are normal. She appears well-developed and well-nourished. She is cooperative.  Non-toxic appearance. She does not appear ill. No distress.  HENT:  Head: Normocephalic.  Right Ear: Hearing, tympanic membrane, external ear and ear canal normal. Tympanic membrane is not erythematous, not retracted and not bulging.  Left Ear: Hearing, tympanic membrane, external ear and ear canal normal. Tympanic membrane is not erythematous, not retracted and not bulging.  Nose: No mucosal edema or rhinorrhea. Right sinus exhibits no maxillary sinus tenderness and no frontal sinus tenderness. Left sinus exhibits no maxillary sinus tenderness and no frontal sinus tenderness.  Mouth/Throat: Uvula is midline, oropharynx is clear and moist and mucous membranes are normal.  Eyes: Conjunctivae, EOM and lids are normal. Pupils are equal, round, and reactive to light. Lids are everted and swept, no foreign bodies found.  Neck: Trachea normal and normal range of motion. Neck supple. Carotid bruit is not present. No mass and no thyromegaly present.  Cardiovascular: Normal rate, regular rhythm, S1 normal, S2 normal, normal heart sounds, intact distal pulses and normal pulses.  Exam reveals no gallop and no friction rub.   No murmur heard. Pulmonary/Chest: Effort normal and breath sounds normal. Not tachypneic. No respiratory distress. She has no decreased breath sounds. She has no wheezes. She has no rhonchi. She has no rales.  Abdominal: Soft. Normal appearance and bowel sounds are normal. There is no tenderness.  Neurological: She is alert.  Skin: Skin  is warm, dry and intact. No rash noted.  Psychiatric: Her speech is normal and behavior is normal. Judgment and thought content normal. Her mood appears not anxious. Cognition and memory are normal. She does not exhibit a depressed mood.           Assessment & Plan:

## 2013-12-04 NOTE — Patient Instructions (Signed)
Stop at lab on the way out. Push fluids.

## 2013-12-04 NOTE — Progress Notes (Signed)
Pre-visit discussion using our clinic review tool. No additional management support is needed unless otherwise documented below in the visit note.  

## 2013-12-08 ENCOUNTER — Other Ambulatory Visit: Payer: Self-pay

## 2013-12-08 NOTE — Telephone Encounter (Signed)
Perry notified as instructed.  .  States her OB-GYN told her this medication was the only thing she could take for Migraines while pregnant.  Advised that this medication is not recommended while lactating given that it is secreted in the breast milk.  Asking if Dr. Diona Browner has any other recommendations.  Please Advise.

## 2013-12-08 NOTE — Telephone Encounter (Signed)
Notify pt that this medication is not recommended in lactation given it is secreted in breast milk.

## 2013-12-08 NOTE — Telephone Encounter (Signed)
I cannot find any other meds that are safer for migraine during lactation other than tylenol/ibuprofen. IPlease send in the fioricet but limit use as much as possible.

## 2013-12-08 NOTE — Telephone Encounter (Signed)
Pt left v/m requesting refill fioricet to [facility]; fioricet was on pts historical med list.Please advise.

## 2013-12-09 MED ORDER — BUTALBITAL-APAP-CAFFEINE 50-325-40 MG PO TABS: 2.0000 | ORAL_TABLET | Freq: Four times a day (QID) | ORAL | Status: DC | PRN

## 2013-12-09 NOTE — Addendum Note (Signed)
Addended by: Carter Kitten on: 12/09/2013 09:29 AM   Modules accepted: Orders

## 2013-12-09 NOTE — Telephone Encounter (Signed)
Prescription called to [facility].

## 2013-12-09 NOTE — Telephone Encounter (Signed)
Fioricet called to [facility].  Vaughn notified and instructed to limit use as much as possible while lactating.

## 2013-12-30 ENCOUNTER — Telehealth: Payer: Self-pay | Admitting: *Deleted

## 2013-12-30 NOTE — Telephone Encounter (Signed)
Okay to refill as requested.

## 2013-12-30 NOTE — Telephone Encounter (Signed)
Received fax from [facility] that patient is requesting a new prescription for Advair Inhaler and Albuterol Resue Inhaler.   Neither medication is on her current medication list.  Please advise.

## 2013-12-31 ENCOUNTER — Other Ambulatory Visit: Payer: Self-pay | Admitting: *Deleted

## 2013-12-31 MED ORDER — ALBUTEROL SULFATE HFA 108 (90 BASE) MCG/ACT IN AERS: 2.0000 | INHALATION_SPRAY | Freq: Four times a day (QID) | RESPIRATORY_TRACT | Status: DC | PRN

## 2013-12-31 MED ORDER — FLUTICASONE-SALMETEROL 250-50 MCG/DOSE IN AEPB: 1.0000 | INHALATION_SPRAY | Freq: Two times a day (BID) | RESPIRATORY_TRACT | Status: DC

## 2013-12-31 NOTE — Telephone Encounter (Signed)
A spoke with Rob at [facility].  They do not have Advair or Albuterol on her profile either.  They have called and left a message for Zelpha to return their call to find out this information and will let us know once they hear back from her.

## 2013-12-31 NOTE — Telephone Encounter (Signed)
Spoke with [place] to verify the dosage of her Advair Inhaler.   [place] notified prescriptions will be sent in to pharmacy.    Prescriptions for Advair and Albuterol Inhaler sent to [facility].

## 2014-01-21 ENCOUNTER — Other Ambulatory Visit: Payer: Self-pay | Admitting: Family Medicine

## 2014-01-22 ENCOUNTER — Other Ambulatory Visit: Payer: Self-pay | Admitting: Family Medicine

## 2014-01-22 NOTE — Telephone Encounter (Signed)
Called to [facility].

## 2014-01-22 NOTE — Telephone Encounter (Signed)
Last office visit 12/04/2013.  Ok to refill?

## 2014-01-28 ENCOUNTER — Telehealth: Payer: Self-pay | Admitting: Family Medicine

## 2014-01-28 MED ORDER — OSELTAMIVIR PHOSPHATE 75 MG PO CAPS: 75.0000 mg | ORAL_CAPSULE | Freq: Two times a day (BID) | ORAL | Status: DC

## 2014-01-28 NOTE — Telephone Encounter (Signed)
Sent in rx for tamiflu but if she is not improving in 24 hours she needs to be seen.

## 2014-01-28 NOTE — Telephone Encounter (Signed)
Patient Information:  Caller Name: Debar  Phone: [phone]  Patient: [name], [name]  Gender: Female  DOB: 06/23/1976  Age: 38 Years  PCP: Eliezer Lofts (Family Practice)  Pregnant: No  Office Follow Up:  Does the office need to follow up with this patient?: Yes  Instructions For The Office: No appt available at [facility] office or [place].  Standing order for Tamiflu not called in because patient has been on chronic steroids for a year. Triages to be seen in office today. PLEASE REVIEW AND CONTACT PATINET FOR APPT /EVALUATION CONCERNS/.  RN Note:  No appt available at [facility] office or [place].  Standing order for Tamiflu not called in because patient has been on chronic steroids for a year. Triages to be seen in office today. PLEASE REVIEW AND CONTACT PATINET FOR APPT /EVALUATION CONCERNS/.  Symptoms  Reason For Call & Symptoms: Patient states sore throat yesterday , congestion and now fever 101.0 (o).  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  No flu vaccine.  She has premature baby home that is 68 months of age.  Breastfeeding. She wants an appt or medication for Tamiflu.  She just completed a year of steroids for Chron's disease.  Tapered off 2 weeks ago.  Reviewed Health History In EMR: Yes  Reviewed Medications In EMR: Yes  Reviewed Allergies In EMR: Yes  Reviewed Surgeries / Procedures: Yes  Date of Onset of Symptoms: 01/27/2014  Treatments Tried: Tylenol , chloreseptic, allergy sinus  Treatments Tried Worked: No  Any Fever: Yes  Fever Taken: Oral  Fever Time Of Reading: 06:00:00  Fever Last Reading: 101.0 OB / GYN:  LMP: Unknown  Guideline(s) Used:  Influenza - Seasonal  Disposition Per Guideline:   Go to Office Now  Reason For Disposition Reached:   Fever > 100.5 F (38.1 C) and diabetes mellitus or weak immune system (e.g., HIV positive, cancer chemo, splenectomy, organ transplant, chronic steroids)  Advice Given:  Reassurance  The treatment of  influenza depends on your main symptoms. Generally, treatment is the same as for other viral respiratory infections (colds). Bed rest is unnecessary.  Treating the Symptoms of Flu  Fever, Muscle Aches, and Headache: For fever more than 101 F (38.3 C), muscle aches, and headaches, take acetaminophen every 4-6 hours (Adults 650 mg) OR ibuprofen every 6-8 hours (Adults 400-600 mg).  Sore Throat: Use throat lozenges, hard candy or warm chicken broth.  Cough: Use cough drops.  Hydrate: Drink extra liquids. If the air in your home is dry, use a humidifier.  No Aspirin  : Do not use aspirin for treatment of fever or pain (Reason: there is an association between influenza and Reye syndrome).  Isolation is Needed Until After the Fever is Gone:   The CDC recommends that people with influenza-like illness remain at home until at least 24 hours after they are free of fever (100 F or 37.8C).  Do NOT go to work or school.  Do NOT go to church, child care centers, shopping, or other public places.  Do NOT shake hands.  Avoid close contact with others (hugging, kissing).  Call Back If:  Fever lasts more than 3 days  Runny nose lasts more than 10 days  Cough lasts more than 3 weeks  You become short of breath or worse.  RN Overrode Recommendation:  Make Appointment  No appt available at [facility] office or [place].  Standing order for Tamiflu not called in because patient has been on chronic  steroids for a year. Triages to be seen in office today. PLEASE REVIEW AND CONTACT PATINET FOR APPT /EVALUATION CONCERNS/.

## 2014-01-29 ENCOUNTER — Telehealth: Payer: Self-pay

## 2014-01-29 MED ORDER — HYDROCODONE-HOMATROPINE 5-1.5 MG/5ML PO SYRP: 5.0000 mL | ORAL_SOLUTION | Freq: Three times a day (TID) | ORAL | Status: DC | PRN

## 2014-01-29 NOTE — Telephone Encounter (Signed)
Spoke with [name].  She states it is usally the pill form that upsets her stomach.  She has tolerated the Hycodan Cough Syrup in the past.  Advised to send her mom up here to the office and I will have Dr. Diona Browner get the prescription ready for pick up.

## 2014-01-29 NOTE — Telephone Encounter (Signed)
As long as she is improving will fill cough suppresant.  Does appear she has intolerances to (codeine, hydrocodone) meds similar to hycodan... Has she tolerated in the past? If so print for me to sign if not use OTC meds such as mucinex DM.

## 2014-01-29 NOTE — Telephone Encounter (Signed)
[facility] left v/m that pt had called them to get Hycodan prescription for cough; [facility] has never filled for pt and asked me to call pt. I spoke with pt and advised pt Dr Rometta Emery instructions if pt was not better after Tamiflu would need to come in office for appt. Pt said she is better today and the Tamiflu is helping 01/28/14 temp ws 102 and on 01/29/14 her temp is 100 something but today pt has prod cough with green phlegm and request Hycodan. Advised pt Hycodan can not be called to pharmacy. Pt said she did not want to schedule appt because overall she is better and pt would send her mother, [name] to pick up rx.Please advise.

## 2014-02-02 ENCOUNTER — Encounter: Payer: Self-pay | Admitting: Family Medicine

## 2014-02-02 ENCOUNTER — Ambulatory Visit (INDEPENDENT_AMBULATORY_CARE_PROVIDER_SITE_OTHER): Payer: BC Managed Care – PPO | Admitting: Family Medicine

## 2014-02-02 VITALS — BP 116/70 | HR 97 | Temp 97.9°F | Ht 62.5 in | Wt 149.0 lb

## 2014-02-02 DIAGNOSIS — J4540 Moderate persistent asthma, uncomplicated: Secondary | ICD-10-CM

## 2014-02-02 DIAGNOSIS — J45901 Unspecified asthma with (acute) exacerbation: Secondary | ICD-10-CM

## 2014-02-02 DIAGNOSIS — J45909 Unspecified asthma, uncomplicated: Secondary | ICD-10-CM

## 2014-02-02 DIAGNOSIS — R062 Wheezing: Secondary | ICD-10-CM

## 2014-02-02 MED ORDER — PREDNISONE 20 MG PO TABS: ORAL_TABLET | ORAL | Status: DC

## 2014-02-02 MED ORDER — ALBUTEROL SULFATE (2.5 MG/3ML) 0.083% IN NEBU
2.5000 mg | INHALATION_SOLUTION | Freq: Once | RESPIRATORY_TRACT | Status: AC
  Administered 2014-02-02: 2.5 mg via RESPIRATORY_TRACT

## 2014-02-02 NOTE — Patient Instructions (Addendum)
Mucinex and fluids to break up mucus.  Start prednisone taper .  Use albuterol every 4 -6 hours as needed.  Call if breathing not improving in next 24-48 hours or if new fever or cough not gradually improving.

## 2014-02-02 NOTE — Progress Notes (Signed)
   Subjective:    Patient ID: [name], female    DOB: 1976/08/21, 38 y.o.   MRN: [id number]  Wheezing  Associated symptoms include shortness of breath. Pertinent negatives include no abdominal pain, chest pain, ear pain or fever.  Shortness of Breath Associated symptoms include wheezing. Pertinent negatives include no abdominal pain, chest pain, ear pain or fever.    38 year old female recently treated for influenza with tamiflu presents with SOB and wheeze. Using albuterol every 6 hours.  She is immunocomp with Chron's dis, not currently on prednisone.   She has history of moderate persistent asthma maintained on advair, albuterol prn. She reports that she feel less fatigued, less myalgia, no fever after tamiflu. She feels much better, just continued issue with wheeze.  Still with nasal discharge and chest congestion.. Productive yellow.   Not using any OTC meds.  Review of Systems  Constitutional: Negative for fever and fatigue.  HENT: Negative for ear pain.   Eyes: Negative for pain.  Respiratory: Positive for shortness of breath and wheezing.   Cardiovascular: Negative for chest pain and palpitations.  Gastrointestinal: Negative for abdominal pain.       Objective:   Physical Exam  Constitutional: Vital signs are normal. She appears well-developed and well-nourished. She is cooperative.  Non-toxic appearance. She does not appear ill. No distress.  HENT:  Head: Normocephalic.  Right Ear: Hearing, tympanic membrane, external ear and ear canal normal. Tympanic membrane is not erythematous, not retracted and not bulging.  Left Ear: Hearing, tympanic membrane, external ear and ear canal normal. Tympanic membrane is not erythematous, not retracted and not bulging.  Nose: Mucosal edema and rhinorrhea present. Right sinus exhibits no maxillary sinus tenderness and no frontal sinus tenderness. Left sinus exhibits no maxillary sinus tenderness and no frontal sinus tenderness.    Mouth/Throat: Uvula is midline, oropharynx is clear and moist and mucous membranes are normal.  Eyes: Conjunctivae, EOM and lids are normal. Pupils are equal, round, and reactive to light. Lids are everted and swept, no foreign bodies found.  Neck: Trachea normal and normal range of motion. Neck supple. Carotid bruit is not present. No mass and no thyromegaly present.  Cardiovascular: Normal rate, regular rhythm, S1 normal, S2 normal, normal heart sounds, intact distal pulses and normal pulses.  Exam reveals no gallop and no friction rub.   No murmur heard. Pulmonary/Chest: Effort normal. Not tachypneic. No respiratory distress. She has decreased breath sounds. She has wheezes in the right upper field, the right middle field, the right lower field, the left upper field, the left middle field and the left lower field. She has no rhonchi. She has no rales.  Moist cough.  Neurological: She is alert.  Skin: Skin is warm, dry and intact. No rash noted.  Psychiatric: Her speech is normal and behavior is normal. Judgment normal. Her mood appears not anxious. Cognition and memory are normal. She does not exhibit a depressed mood.          Assessment & Plan:

## 2014-02-02 NOTE — Progress Notes (Signed)
Pre-visit discussion using our clinic review tool. No additional management support is needed unless otherwise documented below in the visit note.  

## 2014-02-02 NOTE — Telephone Encounter (Signed)
Pt finished Tamiflu 02/01/14; today pt has prod cough with green phlegm,green mucus when blows nose,wheezing and SOB and rattling in chest; pt using rescue inhaler q6h since the weekend. Pt request appt and breathing treatment today.Please advise.

## 2014-02-02 NOTE — Assessment & Plan Note (Signed)
Likely flu resolving trigger.  Mild improvement in wheeze with albuterol neb.  use albuterol prn wheeze for rescue. Increase advair to twice daily.  Start on pred taper. If not dramatically improving in 24 hours eval lungs with CXR and consider antibiotics.

## 2014-02-02 NOTE — Telephone Encounter (Signed)
Dr Diona Browner will see pt today at [facility]. Pt appreciative.

## 2014-02-04 ENCOUNTER — Encounter: Payer: Self-pay | Admitting: Family Medicine

## 2014-02-04 ENCOUNTER — Ambulatory Visit (INDEPENDENT_AMBULATORY_CARE_PROVIDER_SITE_OTHER): Payer: BC Managed Care – PPO | Admitting: Family Medicine

## 2014-02-04 ENCOUNTER — Ambulatory Visit (INDEPENDENT_AMBULATORY_CARE_PROVIDER_SITE_OTHER)
Admission: RE | Admit: 2014-02-04 | Discharge: 2014-02-04 | Disposition: A | Discharge status: Home / Self Care | Payer: BC Managed Care – PPO | Source: Ambulatory Visit | Attending: Family Medicine | Admitting: Family Medicine

## 2014-02-04 ENCOUNTER — Telehealth: Payer: Self-pay | Admitting: Family Medicine

## 2014-02-04 VITALS — BP 110/70 | HR 98 | Temp 98.1°F | Ht 62.5 in | Wt 151.2 lb

## 2014-02-04 DIAGNOSIS — J45901 Unspecified asthma with (acute) exacerbation: Secondary | ICD-10-CM

## 2014-02-04 DIAGNOSIS — J189 Pneumonia, unspecified organism: Secondary | ICD-10-CM | POA: Insufficient documentation

## 2014-02-04 MED ORDER — ALBUTEROL SULFATE (2.5 MG/3ML) 0.083% IN NEBU
5.0000 mg | INHALATION_SOLUTION | Freq: Once | RESPIRATORY_TRACT | Status: AC
  Administered 2014-02-04: 5 mg via RESPIRATORY_TRACT

## 2014-02-04 MED ORDER — IPRATROPIUM BROMIDE 0.02 % IN SOLN
0.5000 mg | Freq: Once | RESPIRATORY_TRACT | Status: AC
Start: 2014-02-04 — End: 2014-02-04
  Administered 2014-02-04: 0.5 mg via RESPIRATORY_TRACT

## 2014-02-04 MED ORDER — DOXYCYCLINE HYCLATE 100 MG PO TABS: 100.0000 mg | ORAL_TABLET | Freq: Two times a day (BID) | ORAL | Status: DC

## 2014-02-04 MED ORDER — DEXAMETHASONE SODIUM PHOSPHATE 10 MG/ML IJ SOLN
10.0000 mg | Freq: Once | INTRAMUSCULAR | Status: AC
  Administered 2014-02-04: 10 mg via INTRAMUSCULAR

## 2014-02-04 NOTE — Patient Instructions (Addendum)
We will call with X-ray results, looks like no focal pneumonia. Will start doxycycline 100 mg twice daily to cover for atypical PNA. Continue albuterol inh every 4-6 hours as needed. Continue Advair at BID dosing. Schedule follow up tommorrow afternoon. If severe shortness of breath.. Go to ER.

## 2014-02-04 NOTE — Assessment & Plan Note (Signed)
Alb/atrov neb given x 1, 10 mg IM of dexamethasone given. Continue pred taper, albuterol. Close follow up tommorow. If severe shortness of breath.. Go to ER.

## 2014-02-04 NOTE — Progress Notes (Signed)
Pre-visit discussion using our clinic review tool. No additional management support is needed unless otherwise documented below in the visit note.  

## 2014-02-04 NOTE — Telephone Encounter (Signed)
[place] notified to be at office today at 4:15pm to be worked in to see Dr. Diona Browner.

## 2014-02-04 NOTE — Telephone Encounter (Signed)
Call pt... Have her come in for an appt today, work her in... Let her known there may be a wait. WIll likly due a CXR and give her another albuterol/atrovent treatment.

## 2014-02-04 NOTE — Telephone Encounter (Signed)
Patient Information:  Caller Name: Monik  Phone: [phone]  Patient: [name], [name]  Gender: Female  DOB: April 16, 1976  Age: 38 Years  PCP: Eliezer Lofts (Family Practice)  Pregnant: No  Office Follow Up:  Does the office need to follow up with this patient?: Yes  Instructions For The Office: Patient requests to come to this office to get breathing treatments and follow up as instructed by PCP on 02/02/14.  Please contact her regarding possible work in appointment this aftenoon (hopefully 1600 or later).  Thank you.   Symptoms  Reason For Call & Symptoms: Patient calling about Asthma.  She had breathing treatments and inhalers, steroids "last week". Was told if not improved to come for more treatments.  She asks if she can come in and get another treatment.  She felt better on 2/111/15, feels worse on 02/04/14.  She has to take her infant for appointment at 14:45 and asks for later appointment.  Emergent symptoms ruled out.  See Today in Office per Asthma Attack due to Coughing continuously that keeps from working or sleeping, and not improved after inhaler o nebulizer.  Reviewed Health History In EMR: Yes  Reviewed Medications In EMR: Yes  Reviewed Allergies In EMR: Yes  Reviewed Surgeries / Procedures: Yes  Date of Onset of Symptoms: 01/28/2014  Treatments Tried: Nebs, Steroids  Treatments Tried Worked: Yes OB / GYN:  LMP: 01/28/2013  Guideline(s) Used:  Asthma Attack  Disposition Per Guideline:   See Today in Office  Reason For Disposition Reached:   Coughing continuously (nonstop) that keeps from working or sleeping, and not improved after inhaler or nebulizer  Advice Given:  Quick-Relief Asthma Medicine:   Start your quick-relief medicine (e.g., albuterol, salbutamol) at the first sign of any coughing or shortness of breath (don't wait for wheezing). Use your inhaler (2 puffs each time) or nebulizer every 4 hours. Continue the quick-relief medicine until you have not wheezed or  coughed for 48 hours.  The best "cough medicine" for an adult with asthma is always the asthma medicine (Note: Don't use cough suppressants, but cough drops may help a tickly cough).  Long-Term-Control Asthma Medicine:  If you are using a controller medicine (e.g., inhaled steroids or cromolyn), continue to take it as directed.  Drinking Liquids:  Try to drink normal amount of liquids (e.g., water). Being adequately hydrated makes it easier to cough up the sticky lung mucus.  Patient Will Follow Care Advice:  YES

## 2014-02-04 NOTE — Progress Notes (Signed)
   Subjective:    Patient ID: [name], female    DOB: Apr 27, 1976, 38 y.o.   MRN: [id number]  HPI  38 year old female with recent presumed influenza s/p tamiflu, seen 2 days ago with asthma exacerbation returns with continued symptoms of chest tightness, SOB and wheeze. She was started on pred taper and increase advair, albuterol prn 2 days ago.  She was doing better yesterday, but today schest tightness returned despite q 4-6 hour albuterol. 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  No new fever or new symptoms.   Review of Systems  Constitutional: Positive for fatigue. Negative for fever.  HENT: Negative for ear pain.   Eyes: Negative for pain.  Respiratory: Positive for cough, chest tightness, shortness of breath and wheezing.   Cardiovascular: Negative for chest pain, palpitations and leg swelling.  Gastrointestinal: Negative for abdominal pain.       Objective:   Physical Exam  Constitutional: Vital signs are normal. She appears well-developed and well-nourished. She is cooperative.  Non-toxic appearance. She does not appear ill. No distress.  HENT:  Head: Normocephalic.  Right Ear: Hearing, tympanic membrane, external ear and ear canal normal. Tympanic membrane is not erythematous, not retracted and not bulging.  Left Ear: Hearing, tympanic membrane, external ear and ear canal normal. Tympanic membrane is not erythematous, not retracted and not bulging.  Nose: Mucosal edema and rhinorrhea present. Right sinus exhibits no maxillary sinus tenderness and no frontal sinus tenderness. Left sinus exhibits no maxillary sinus tenderness and no frontal sinus tenderness.  Mouth/Throat: Uvula is midline, oropharynx is clear and moist and mucous membranes are normal.  Eyes: Conjunctivae, EOM and lids are normal. Pupils are equal, round, and reactive to light. Lids are everted and swept, no foreign bodies found.  Neck: Trachea normal and normal range of motion. Neck  supple. Carotid bruit is not present. No mass and no thyromegaly present.  Cardiovascular: Normal rate, regular rhythm, S1 normal, S2 normal, normal heart sounds, intact distal pulses and normal pulses.  Exam reveals no gallop and no friction rub.   No murmur heard. Pulmonary/Chest: Effort normal. Not tachypneic. No respiratory distress. She has decreased breath sounds. She has wheezes in the right upper field, the right middle field, the right lower field, the left upper field, the left middle field and the left lower field. She has no rhonchi. She has no rales.  Decreased air movement throughout. Moist cough.  Neurological: She is alert.  Skin: Skin is warm, dry and intact. No rash noted.  Psychiatric: Her speech is normal and behavior is normal. Judgment normal. Her mood appears not anxious. Cognition and memory are normal. She does not exhibit a depressed mood.          Assessment & Plan:

## 2014-02-04 NOTE — Assessment & Plan Note (Signed)
CXR clear ... Will have radiologist review. Will cover for atypical PNA given worsening as opposed to getting better... May still be reaction to flu ( no flu test was done at initial illness, but she is S/ P tamiflu.

## 2014-02-05 ENCOUNTER — Encounter: Payer: Self-pay | Admitting: Family Medicine

## 2014-02-05 ENCOUNTER — Ambulatory Visit (INDEPENDENT_AMBULATORY_CARE_PROVIDER_SITE_OTHER): Payer: BC Managed Care – PPO | Admitting: Family Medicine

## 2014-02-05 VITALS — BP 130/90 | HR 86 | Temp 98.0°F | Ht 62.5 in | Wt 152.2 lb

## 2014-02-05 DIAGNOSIS — J189 Pneumonia, unspecified organism: Secondary | ICD-10-CM

## 2014-02-05 DIAGNOSIS — J45901 Unspecified asthma with (acute) exacerbation: Secondary | ICD-10-CM

## 2014-02-05 MED ORDER — DEXAMETHASONE SODIUM PHOSPHATE 10 MG/ML IJ SOLN
10.0000 mg | Freq: Once | INTRAMUSCULAR | Status: AC
  Administered 2014-02-05: 10 mg via INTRAMUSCULAR

## 2014-02-05 NOTE — Patient Instructions (Signed)
Rest over the weekend! I mean it! Continue prednisone for 60 mg x 5 more days, then 40 mg x 4 days then 20 mg x 3 days then off. Complete antibiotics. Can use albuterol every 4- 6 hours as needed. Continue higher dose Advair until better. Go to ER if shortness of breath. Call if not continuing to improve by early next week.

## 2014-02-05 NOTE — Progress Notes (Signed)
   Subjective:    Patient ID: [name], female    DOB: 09/21/1976, 38 y.o.   MRN: [id number]  HPI  38 year old female presents for 24 hour follow up after alb/atov neb , steroid injection and initiation of doxycycline for atypical pneumonia and resulting asthma response.   Today she reports that  she is exhausted.  Continue shortness of breath but better than yesterday. Using albuterol every 6 hours. She is still taking 60 mg daily of prednisone    Review of Systems  Constitutional: Negative for fever and fatigue.  HENT: Negative for ear pain.   Eyes: Negative for pain.  Respiratory: Positive for shortness of breath. Negative for chest tightness.   Cardiovascular: Negative for chest pain, palpitations and leg swelling.  Gastrointestinal: Negative for abdominal pain.  Genitourinary: Negative for dysuria.       Objective:   Physical Exam  Constitutional: Vital signs are normal. She appears well-developed and well-nourished. She is cooperative.  Non-toxic appearance. She does not appear ill. No distress.  HENT:  Head: Normocephalic.  Right Ear: Hearing, tympanic membrane, external ear and ear canal normal. Tympanic membrane is not erythematous, not retracted and not bulging.  Left Ear: Hearing, tympanic membrane, external ear and ear canal normal. Tympanic membrane is not erythematous, not retracted and not bulging.  Nose: No mucosal edema or rhinorrhea. Right sinus exhibits no maxillary sinus tenderness and no frontal sinus tenderness. Left sinus exhibits no maxillary sinus tenderness and no frontal sinus tenderness.  Mouth/Throat: Uvula is midline, oropharynx is clear and moist and mucous membranes are normal.  Eyes: Conjunctivae, EOM and lids are normal. Pupils are equal, round, and reactive to light. Lids are everted and swept, no foreign bodies found.  Neck: Trachea normal and normal range of motion. Neck supple. Carotid bruit is not present. No mass and no thyromegaly  present.  Cardiovascular: Normal rate, regular rhythm, S1 normal, S2 normal, normal heart sounds, intact distal pulses and normal pulses.  Exam reveals no gallop and no friction rub.   No murmur heard. Pulmonary/Chest: Effort normal. Not tachypneic. No respiratory distress. She has no decreased breath sounds. She has wheezes. She has no rhonchi. She has no rales.  decreased wheezes and increased air movement throughout lung compared to yesterday.  Abdominal: Soft. Normal appearance and bowel sounds are normal. There is no tenderness.  Neurological: She is alert.  Skin: Skin is warm, dry and intact. No rash noted.  Psychiatric: Her speech is normal and behavior is normal. Judgment and thought content normal. Her mood appears not anxious. Cognition and memory are normal. She does not exhibit a depressed mood.          Assessment & Plan:

## 2014-02-05 NOTE — Assessment & Plan Note (Signed)
Improved after steroid injection. Repeat dexamethasone 10 mg today.  Continue prednisone taper for longer drawn out period.

## 2014-02-05 NOTE — Assessment & Plan Note (Signed)
Complete antibitoics.

## 2014-02-05 NOTE — Progress Notes (Signed)
Pre-visit discussion using our clinic review tool. No additional management support is needed unless otherwise documented below in the visit note.  

## 2014-02-08 ENCOUNTER — Other Ambulatory Visit: Payer: Self-pay | Admitting: Family Medicine

## 2014-02-08 NOTE — Telephone Encounter (Signed)
i am unclear here -- it looks like Dr. B put her on prednisone on 02/05/2014, and she should still be on 60 mg of prednisone??

## 2014-02-08 NOTE — Telephone Encounter (Signed)
Rob from [facility] called to ck on status of prednisone refill request; pt has 5 pills left.

## 2014-02-08 NOTE — Telephone Encounter (Signed)
Ok to refill per Dr. Lorelei Pont.

## 2014-02-08 NOTE — Telephone Encounter (Signed)
Patients last visit 02/05/14. Ok to refill?

## 2014-02-17 ENCOUNTER — Other Ambulatory Visit: Payer: Self-pay | Admitting: *Deleted

## 2014-02-17 MED ORDER — FLUCONAZOLE 150 MG PO TABS: 150.0000 mg | ORAL_TABLET | Freq: Once | ORAL | Status: DC

## 2014-03-10 ENCOUNTER — Other Ambulatory Visit: Payer: Self-pay | Admitting: Family Medicine

## 2014-03-10 NOTE — Telephone Encounter (Signed)
Last office visit 02/05/2014.  Ok to refill?

## 2014-03-11 NOTE — Telephone Encounter (Signed)
Rxs called in as prescribed

## 2014-03-26 ENCOUNTER — Other Ambulatory Visit: Payer: Self-pay | Admitting: Family Medicine

## 2014-04-14 ENCOUNTER — Other Ambulatory Visit: Payer: Self-pay | Admitting: Family Medicine

## 2014-04-14 NOTE — Telephone Encounter (Signed)
Last office visit 02/05/2014.  Last refilled 03/10/2014 for #30.  Ok to refill?

## 2014-04-15 NOTE — Telephone Encounter (Signed)
Called to [facility].

## 2014-04-16 ENCOUNTER — Encounter: Payer: Self-pay | Admitting: Family Medicine

## 2014-04-16 ENCOUNTER — Ambulatory Visit (INDEPENDENT_AMBULATORY_CARE_PROVIDER_SITE_OTHER): Payer: BC Managed Care – PPO | Admitting: Family Medicine

## 2014-04-16 ENCOUNTER — Telehealth: Payer: Self-pay | Admitting: Family Medicine

## 2014-04-16 VITALS — BP 110/80 | HR 81 | Temp 98.1°F | Ht 62.5 in | Wt 149.8 lb

## 2014-04-16 DIAGNOSIS — S30860A Insect bite (nonvenomous) of lower back and pelvis, initial encounter: Secondary | ICD-10-CM

## 2014-04-16 DIAGNOSIS — S30861A Insect bite (nonvenomous) of abdominal wall, initial encounter: Secondary | ICD-10-CM

## 2014-04-16 DIAGNOSIS — W57XXXA Bitten or stung by nonvenomous insect and other nonvenomous arthropods, initial encounter: Principal | ICD-10-CM

## 2014-04-16 NOTE — Telephone Encounter (Signed)
Patient Information:  Caller Name: Leigh  Phone: [phone]  Patient: [name], [name]  Gender: Female  DOB: Jun 12, 1976  Age: 38 Years  PCP: Eliezer Lofts (Family Practice)  Pregnant: No  Office Follow Up:  Does the office need to follow up with this patient?: No  Instructions For The Office: N/A  RN Note:  Osvaldo Human is unsure if deer tick.  concerned. Appt scheduled. Home care advice and call back parameters reviewed.  Symptoms  Reason For Call & Symptoms: Went camping on Monday, Tuesday, Wednesday (4-20 to 4-22) .  Found a tick on her today 04/16/14.  Location- near pubic hair.  Removed-with tweezers.  Area is red  size of pencil eraser. No drainage, no fever or rash.  Describes small tick- unsure if deer tick  She is currently nursing.  Reviewed Health History In EMR: Yes  Reviewed Medications In EMR: Yes  Reviewed Allergies In EMR: Yes  Reviewed Surgeries / Procedures: Yes  Date of Onset of Symptoms: 04/12/2014  Treatments Tried: Alcohol swab, showered.  Treatments Tried Worked: Yes OB / GYN:  LMP: 04/05/2014  Guideline(s) Used:  Tick Bite  Disposition Per Guideline:   See Today in Office  Reason For Disposition Reached:   Probable deer tick that was attached > 24 hours (or tick appears swollen, not flat)  Advice Given:  Antibiotic Ointment:  Wash the wound and your hands with soap and water after removal to prevent catching any tick disease. Apply an over-the-counter antibiotic ointment (e.g., bacitracin) to the bite once.  Expected Course:  Tick bites normally do not itch or hurt. That is why they often go unnoticed.  Call Back If:  Fever or rash occur in the next 2 weeks  Bite begins to look infected  You become worse.  Tiny Deer Tick Removal:  Place tick in a sealed container (e.g., glass jar, Ziploc plastic bag), in case your doctor wants to see it.  Patient Will Follow Care Advice:  YES  Appointment Scheduled:  04/16/2014 15:00:00 Appointment Scheduled  Provider:  Eliezer Lofts [facility])

## 2014-04-16 NOTE — Assessment & Plan Note (Signed)
No current bacterial skin infection, just mild local irritation.  Discussed time frame of tick borne illness and symptoms to be aware of.

## 2014-04-16 NOTE — Progress Notes (Signed)
   Subjective:    Patient ID: [name], female    DOB: November 08, 1976, 38 y.o.   MRN: [id number]  HPI  38 year old female presents after tick bite  Noted this morning attached to lower abdomen above pubic hairline. Small area of redness at bite site.  Not itchy, is   She went camping earlier int he week, tick may have been present > 72 hours. Swiped with alcohol and removed with tweezer.   Review of Systems  Constitutional: Positive for fatigue. Negative for fever.  HENT: Negative for ear pain.   Eyes: Negative for pain.  Neurological: Positive for headaches.       Objective:   Physical Exam  Constitutional: Vital signs are normal. She appears well-developed and well-nourished. She is cooperative.  Non-toxic appearance. She does not appear ill. No distress.  HENT:  Head: Normocephalic.  Right Ear: Hearing, tympanic membrane, external ear and ear canal normal. Tympanic membrane is not erythematous, not retracted and not bulging.  Left Ear: Hearing, tympanic membrane, external ear and ear canal normal. Tympanic membrane is not erythematous, not retracted and not bulging.  Nose: No mucosal edema or rhinorrhea. Right sinus exhibits no maxillary sinus tenderness and no frontal sinus tenderness. Left sinus exhibits no maxillary sinus tenderness and no frontal sinus tenderness.  Mouth/Throat: Uvula is midline, oropharynx is clear and moist and mucous membranes are normal.  Eyes: Conjunctivae, EOM and lids are normal. Pupils are equal, round, and reactive to light. Lids are everted and swept, no foreign bodies found.  Neck: Trachea normal and normal range of motion. Neck supple. Carotid bruit is not present. No mass and no thyromegaly present.  Cardiovascular: Normal rate, regular rhythm, S1 normal, S2 normal, normal heart sounds, intact distal pulses and normal pulses.  Exam reveals no gallop and no friction rub.   No murmur heard. Pulmonary/Chest: Effort normal and breath sounds normal.  Not tachypneic. No respiratory distress. She has no decreased breath sounds. She has no wheezes. She has no rhonchi. She has no rales.  Abdominal: Soft. Normal appearance and bowel sounds are normal. There is no tenderness.  Neurological: She is alert.  Skin: Skin is warm, dry and intact. No rash noted.  3 mm nodule low abdomen  Psychiatric: Her speech is normal and behavior is normal. Judgment and thought content normal. Her mood appears not anxious. Cognition and memory are normal. She does not exhibit a depressed mood.          Assessment & Plan:

## 2014-04-16 NOTE — Progress Notes (Signed)
Pre visit review using our clinic review tool, if applicable. No additional management support is needed unless otherwise documented below in the visit note. 

## 2014-04-16 NOTE — Patient Instructions (Signed)
Call if any of the below symptoms starting after the next  2weeks, ie. Any rash or fever.  Expect some itching and mild redness at the site, can use OTC hydrocortisone to help symptoms if needed.   Tick Bite Information Ticks are insects that attach themselves to the skin and draw blood for food. There are various types of ticks. Common types include wood ticks and deer ticks. Most ticks live in shrubs and grassy areas. Ticks can climb onto your body when you make contact with leaves or grass where the tick is waiting. The most common places on the body for ticks to attach themselves are the scalp, neck, armpits, waist, and groin. Most tick bites are harmless, but sometimes ticks carry germs that cause diseases. These germs can be spread to a person during the tick's feeding process. The chance of a disease spreading through a tick bite depends on:   The type of tick.  Time of year.   How long the tick is attached.   Geographic location.  HOW CAN YOU PREVENT TICK BITES? Take these steps to help prevent tick bites when you are outdoors:  Wear protective clothing. Long sleeves and long pants are best.   Wear white clothes so you can see ticks more easily.  Tuck your pant legs into your socks.   If walking on a trail, stay in the middle of the trail to avoid brushing against bushes.  Avoid walking through areas with long grass.  Put insect repellent on all exposed skin and along boot tops, pant legs, and sleeve cuffs.   Check clothing, hair, and skin repeatedly and before going inside.   Brush off any ticks that are not attached.  Take a shower or bath as soon as possible after being outdoors.  WHAT IS THE PROPER WAY TO REMOVE A TICK? Ticks should be removed as soon as possible to help prevent diseases caused by tick bites. 1. If latex gloves are available, put them on before trying to remove a tick.  2. Using fine-point tweezers, grasp the tick as close to the skin as  possible. You may also use curved forceps or a tick removal tool. Grasp the tick as close to its head as possible. Avoid grasping the tick on its body. 3. Pull gently with steady upward pressure until the tick lets go. Do not twist the tick or jerk it suddenly. This may break off the tick's head or mouth parts. 4. Do not squeeze or crush the tick's body. This could force disease-carrying fluids from the tick into your body.  5. After the tick is removed, wash the bite area and your hands with soap and water or other disinfectant such as alcohol. 6. Apply a small amount of antiseptic cream or ointment to the bite site.  7. Wash and disinfect any instruments that were used.  Do not try to remove a tick by applying a hot match, petroleum jelly, or fingernail polish to the tick. These methods do not work and may increase the chances of disease being spread from the tick bite.  WHEN SHOULD YOU SEEK MEDICAL CARE? Contact your health care provider if you are unable to remove a tick from your skin or if a part of the tick breaks off and is stuck in the skin.  After a tick bite, you need to be aware of signs and symptoms that could be related to diseases spread by ticks. Contact your health care provider if you develop any of  the following in the days or weeks after the tick bite:  Unexplained fever.  Rash. A circular rash that appears days or weeks after the tick bite may indicate the possibility of Lyme disease. The rash may resemble a target with a bull's-eye and may occur at a different part of your body than the tick bite.  Redness and swelling in the area of the tick bite.   Tender, swollen lymph glands.   Diarrhea.   Weight loss.   Cough.   Fatigue.   Muscle, joint, or bone pain.   Abdominal pain.   Headache.   Lethargy or a change in your level of consciousness.  Difficulty walking or moving your legs.   Numbness in the legs.   Paralysis.  Shortness of breath.    Confusion.   Repeated vomiting.  Document Released: 12/07/2000 Document Revised: 09/30/2013 Document Reviewed: 05/20/2013 [facility] Patient Information 2014 [place].

## 2014-04-25 ENCOUNTER — Other Ambulatory Visit: Payer: Self-pay | Admitting: Family Medicine

## 2014-04-26 ENCOUNTER — Telehealth: Payer: Self-pay

## 2014-04-26 MED ORDER — DOXYCYCLINE HYCLATE 100 MG PO TABS: 100.0000 mg | ORAL_TABLET | Freq: Two times a day (BID) | ORAL | Status: DC

## 2014-04-26 NOTE — Telephone Encounter (Signed)
Pt left v/m; pt was seen 04/16/14, a tick had been removed; pt was to cb if began with symptoms; today pt began with symptoms,pt feels "lousy", entire body hurts and pt feels feverish. Pt request antibiotic discussed at 04/16/14 appt sent to [facility]. Pt request cb.

## 2014-04-26 NOTE — Telephone Encounter (Signed)
Rx sent in to treat tick borne illlness.

## 2014-04-27 NOTE — Telephone Encounter (Signed)
[place] notified antibiotics have been sent to [facility].

## 2014-05-05 ENCOUNTER — Encounter: Payer: Self-pay | Admitting: Internal Medicine

## 2014-05-05 ENCOUNTER — Ambulatory Visit (INDEPENDENT_AMBULATORY_CARE_PROVIDER_SITE_OTHER): Payer: BC Managed Care – PPO | Admitting: Internal Medicine

## 2014-05-05 VITALS — BP 110/70 | HR 102 | Temp 98.6°F | Wt 146.0 lb

## 2014-05-05 DIAGNOSIS — IMO0001 Reserved for inherently not codable concepts without codable children: Secondary | ICD-10-CM

## 2014-05-05 DIAGNOSIS — R350 Frequency of micturition: Secondary | ICD-10-CM

## 2014-05-05 DIAGNOSIS — M545 Low back pain, unspecified: Secondary | ICD-10-CM

## 2014-05-05 DIAGNOSIS — R3915 Urgency of urination: Secondary | ICD-10-CM

## 2014-05-05 NOTE — Progress Notes (Signed)
HPI  Pt presents to the clinic today with c/o urgency, frequency and low back pain. She reports this started 5 days ago. She denies fever, chills or body aches. She does have a history of recurrent UTIs. She is currently on Doxycycline for a tick bite. She started that 04/16/14.   Review of Systems  Past Medical History  Diagnosis Date  . Crohn disease 2000    remission  . Asthma     no intubations or ICU stays  . Endometriosis   . Cat allergies   . Dysautonomia     recurrent syncope s/p ILR by Dr Doreatha Lew  . Palpitations   . History of recurrent UTIs   . Chronic nausea     around ovulation time    Family History  Problem Relation Age of Onset  . Hypertension Mother   . Hyperlipidemia Mother   . Arthritis Mother   . Diabetes Father   . Coronary artery disease Father   . Heart disease Father 39    MI age 24  . Hyperlipidemia Brother   . Hypertension Brother     History   Social History  . Marital Status: Married    Spouse Name: N/A    Number of Children: N/A  . Years of Education: N/A   Occupational History  .  Other    Airline pilot   Social History Main Topics  . Smoking status: Never Smoker   . Smokeless tobacco: Never Used  . Alcohol Use: Yes     Comment: occasionally  . Drug Use: No  . Sexual Activity: Yes   Other Topics Concern  . Not on file   Social History Narrative  . No narrative on file    Allergies  Allergen Reactions  . Compazine [Prochlorperazine Edisylate] Anaphylaxis  . Reglan [Metoclopramide] Anaphylaxis  . Sulfonamide Derivatives Other (See Comments)    Hematemesis; documented while a young child  . Clarithromycin Other (See Comments)    Reacts with chron's disease  . Macrobid [Nitrofurantoin] Nausea And Vomiting  . Phenergan [Promethazine Hcl]     "muscle twitches"  . Codeine Nausea And Vomiting  . Hydrocodone Nausea And Vomiting  . Promethazine Hcl Other (See Comments)    Muscular seizures    Constitutional: Denies  fever, malaise, fatigue, headache or abrupt weight changes.   GU: Pt reports urgency, frequency and pain with urination. Denies burning sensation, blood in urine, odor or discharge. Skin: Denies redness, rashes, lesions or ulcercations.   No other specific complaints in a complete review of systems (except as listed in HPI above).    Objective:   Physical Exam  BP 110/70  Pulse 102  Temp(Src) 98.6 F (37 C) (Oral)  Wt 146 lb (66.225 kg)  SpO2 98%  LMP 04/09/2014 Wt Readings from Last 3 Encounters:  05/05/14 146 lb (66.225 kg)  04/16/14 149 lb 12 oz (67.926 kg)  02/05/14 152 lb 4 oz (69.06 kg)    General: Appears her stated age, well developed, well nourished in NAD. Cardiovascular: Normal rate and rhythm. S1,S2 noted.  No murmur, rubs or gallops noted. No JVD or BLE edema. No carotid bruits noted. Pulmonary/Chest: Normal effort and positive vesicular breath sounds. No respiratory distress. No wheezes, rales or ronchi noted.  Abdomen: Soft and nontender. Normal bowel sounds, no bruits noted. No distention or masses noted. Liver, spleen and kidneys non palpable. Tender to palpation over the bladder area. No CVA tenderness.      Assessment & Plan:  Urgency, Frequency, Dysuria secondary to possible UTI  Urinalysis: normal (but she is on antibiotics) Continue Doxycycline.  OK to take AZO OTC Drink plenty of fluids  RTC as needed or if symptoms persist.

## 2014-05-05 NOTE — Progress Notes (Signed)
Pre visit review using our clinic review tool, if applicable. No additional management support is needed unless otherwise documented below in the visit note. 

## 2014-05-05 NOTE — Patient Instructions (Signed)
Urinary Tract Infection  Urinary tract infections (UTIs) can develop anywhere along your urinary tract. Your urinary tract is your body's drainage system for removing wastes and extra water. Your urinary tract includes two kidneys, two ureters, a bladder, and a urethra. Your kidneys are a pair of bean-shaped organs. Each kidney is about the size of your fist. They are located below your ribs, one on each side of your spine.  CAUSES  Infections are caused by microbes, which are microscopic organisms, including fungi, viruses, and bacteria. These organisms are so small that they can only be seen through a microscope. Bacteria are the microbes that most commonly cause UTIs.  SYMPTOMS   Symptoms of UTIs may vary by age and gender of the patient and by the location of the infection. Symptoms in young women typically include a frequent and intense urge to urinate and a painful, burning feeling in the bladder or urethra during urination. Older women and men are more likely to be tired, shaky, and weak and have muscle aches and abdominal pain. A fever may mean the infection is in your kidneys. Other symptoms of a kidney infection include pain in your back or sides below the ribs, nausea, and vomiting.  DIAGNOSIS  To diagnose a UTI, your caregiver will ask you about your symptoms. Your caregiver also will ask to provide a urine sample. The urine sample will be tested for bacteria and white blood cells. White blood cells are made by your body to help fight infection.  TREATMENT   Typically, UTIs can be treated with medication. Because most UTIs are caused by a bacterial infection, they usually can be treated with the use of antibiotics. The choice of antibiotic and length of treatment depend on your symptoms and the type of bacteria causing your infection.  HOME CARE INSTRUCTIONS   If you were prescribed antibiotics, take them exactly as your caregiver instructs you. Finish the medication even if you feel better after you  have only taken some of the medication.   Drink enough water and fluids to keep your urine clear or pale yellow.   Avoid caffeine, tea, and carbonated beverages. They tend to irritate your bladder.   Empty your bladder often. Avoid holding urine for long periods of time.   Empty your bladder before and after sexual intercourse.   After a bowel movement, women should cleanse from front to back. Use each tissue only once.  SEEK MEDICAL CARE IF:    You have back pain.   You develop a fever.   Your symptoms do not begin to resolve within 3 days.  SEEK IMMEDIATE MEDICAL CARE IF:    You have severe back pain or lower abdominal pain.   You develop chills.   You have nausea or vomiting.   You have continued burning or discomfort with urination.  MAKE SURE YOU:    Understand these instructions.   Will watch your condition.   Will get help right away if you are not doing well or get worse.  Document Released: 09/19/2005 Document Revised: 06/10/2012 Document Reviewed: 01/18/2012  Exit[employer] Patient Information 2014 [employer].

## 2014-05-12 ENCOUNTER — Other Ambulatory Visit: Payer: Self-pay | Admitting: Family Medicine

## 2014-05-12 NOTE — Telephone Encounter (Signed)
Last office visit 04/16/2014.  Last refilled 04/15/2014 for #30.  Ok to refill?

## 2014-05-13 NOTE — Telephone Encounter (Signed)
Called to [facility].

## 2014-05-18 ENCOUNTER — Telehealth: Payer: Self-pay

## 2014-05-18 ENCOUNTER — Other Ambulatory Visit: Payer: Self-pay | Admitting: Family Medicine

## 2014-05-18 NOTE — Telephone Encounter (Signed)
Will call in rx for prednisone taper.  Please let pt know,  [name]. If sob worsening or not improving in 48 hours she needs to be seen in office.... If severe go to ER.  [name]:Get records from [facility] tommorow please for my review.

## 2014-05-18 NOTE — Telephone Encounter (Signed)
Patient advised.

## 2014-05-18 NOTE — Telephone Encounter (Signed)
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 Pt said cannot come in for appt but request prednisone 20 mg to [facility]. Pt request cb.

## 2014-05-28 ENCOUNTER — Ambulatory Visit (INDEPENDENT_AMBULATORY_CARE_PROVIDER_SITE_OTHER): Payer: BC Managed Care – PPO | Admitting: Family Medicine

## 2014-05-28 ENCOUNTER — Encounter: Payer: Self-pay | Admitting: Family Medicine

## 2014-05-28 VITALS — BP 118/78 | HR 88 | Temp 97.9°F | Wt 145.5 lb

## 2014-05-28 DIAGNOSIS — J019 Acute sinusitis, unspecified: Secondary | ICD-10-CM | POA: Insufficient documentation

## 2014-05-28 DIAGNOSIS — H6590 Unspecified nonsuppurative otitis media, unspecified ear: Secondary | ICD-10-CM

## 2014-05-28 DIAGNOSIS — K5090 Crohn's disease, unspecified, without complications: Secondary | ICD-10-CM

## 2014-05-28 MED ORDER — MORPHINE SULFATE 10 MG/5ML PO SOLN: 2.0000 mg | ORAL | Status: DC | PRN

## 2014-05-28 MED ORDER — PREDNISONE 20 MG PO TABS: ORAL_TABLET | ORAL | Status: DC

## 2014-05-28 MED ORDER — AZITHROMYCIN 250 MG PO TABS: ORAL_TABLET | ORAL | Status: DC

## 2014-05-28 NOTE — Assessment & Plan Note (Addendum)
With initial failure of augmentin and recent doxycycline course <1 mo ago. Anticipate persistent sinus infection - will treat with zpack per pt preference. Will also prescribe prednisone course for significant congestion and pain. Anticipate serous otitis along with sinusitis - if persistent muffled hearing discussed referral to ENT. Provided with script for low dose morphine suspension to take for breakthrough ear pain - as pt cannot take NSAIDs, does not tolerate codeine, hydrocodone, oxycodone. Pt agrees with plan.

## 2014-05-28 NOTE — Progress Notes (Signed)
Pre visit review using our clinic review tool, if applicable. No additional management support is needed unless otherwise documented below in the visit note. 

## 2014-05-28 NOTE — Progress Notes (Signed)
BP 118/78  Pulse 88  Temp(Src) 97.9 F (36.6 C) (Oral)  Wt 145 lb 8 oz (65.998 kg)  LMP 05/14/2014   CC: L ear concerns  Subjective:    Patient ID: [name], female    DOB: [birth year], 38 y.o.   MRN: [id number]  HPI: TANAYSIA BHARDWAJ is a 38 y.o. female presenting on 05/28/2014 for El[place]a day had bad sinus infection and bronchitis with fever 102 treated with augmentin by [facility] for 10 d course and oral steroid course. Also continued advair and albuterol for her h/o asthma. Since then lungs have improved but has persistent sinus congestion and malaise. Persistent cough from drainage down throat and L ear is muffled and painful, worsening recently. Head congestion has persisted. Some trouble with equilibrium as well.  No more fevers, no headaches, no abd pain.  Recent doxycycline course at beginning of May after tick bite. Daughter recently sick as well with sinusitis, responded well on zpack.  No smokers at home. + asthma and allergies (on albuterol, zyrtec, flonase, advair). Currently on diflucan after abx.  H/o crohn's on mesalamine. Stressed - finishing school year - [employer].  Relevant past medical, surgical, family and social history reviewed and updated as indicated.  Allergies and medications reviewed and updated. Current Outpatient Prescriptions on File Prior to Visit  Medication Sig  . albuterol (PROVENTIL HFA;VENTOLIN HFA) 108 (90 BASE) MCG/ACT inhaler Inhale 2 puffs into the lungs every 6 (six) hours as needed for wheezing or shortness of breath.  . butalbital-acetaminophen-caffeine (FIORICET, ESGIC) 50-325-40 MG per tablet TAKE 2 TABLETS EVERY 6 HOURS AS NEEDED  . cetirizine (ZYRTEC) 10 MG tablet TAKE 1 TABLET BY MOUTH DAILY  . dexlansoprazole (DEXILANT) 60 MG capsule Take 60 mg by mouth daily.  . Fluticasone-Salmeterol (ADVAIR DISKUS) 250-50 MCG/DOSE AEPB Inhale 1 puff into the lungs 2 (two) times daily.  . Mesalamine (DELZICOL) 400 MG CPDR DR  capsule Take 800 mg by mouth 3 (three) times daily.  . Prenatal Vit-Fe Fumarate-FA (PRENATAL PO) Take 1 tablet by mouth daily.  . sertraline (ZOLOFT) 50 MG tablet TAKE 1 TABLET BY MOUTH DAILY  . zolpidem (AMBIEN) 10 MG tablet TAKE ONE TABLET BY MOUTH EVERY NIGHT AT BEDTIME   No current facility-administered medications on file prior to visit.    Review of Systems Per HPI unless specifically indicated above    Objective:    BP 118/78  Pulse 88  Temp(Src) 97.9 F (36.6 C) (Oral)  Wt 145 lb 8 oz (65.998 kg)  LMP 05/14/2014  Physical Exam  Nursing note and vitals reviewed. Constitutional: She appears well-developed and well-nourished. No distress.  HENT:  Head: Normocephalic and atraumatic.  Right Ear: Hearing, external ear and ear canal normal.  Left Ear: Hearing, external ear and ear canal normal.  Nose: No mucosal edema or rhinorrhea. Right sinus exhibits maxillary sinus tenderness and frontal sinus tenderness. Left sinus exhibits maxillary sinus tenderness and frontal sinus tenderness.  Mouth/Throat: Uvula is midline, oropharynx is clear and moist and mucous membranes are normal. No oropharyngeal exudate, posterior oropharyngeal edema, posterior oropharyngeal erythema or tonsillar abscesses.  Some congestion behind bilateral TMs but no obvious bulging, erythema. Light reflex present  Eyes: Conjunctivae and EOM are normal. Pupils are equal, round, and reactive to light. No scleral icterus.  Neck: Normal range of motion. Neck supple.  Cardiovascular: Normal rate, regular rhythm, normal heart sounds and intact distal pulses.   No murmur heard. Pulmonary/Chest: Effort normal and  breath sounds normal. No respiratory distress. She has no wheezes. She has no rales.  Lymphadenopathy:    She has no cervical adenopathy.  Skin: Skin is warm and dry. No rash noted.       Assessment & Plan:   Problem List Items Addressed This Visit   Sinusitis, acute - Primary     With initial failure  of augmentin and recent doxycycline course <1 mo ago. Anticipate persistent sinus infection - will treat with zpack per pt preference. Will also prescribe prednisone course for significant congestion and pain. Anticipate serous otitis along with sinusitis - if persistent muffled hearing discussed referral to ENT. Provided with script for low dose morphine suspension to take for breakthrough ear pain - as pt cannot take NSAIDs, does not tolerate codeine, hydrocodone, oxycodone. Pt agrees with plan.    Relevant Medications      azithromycin (ZITHROMAX) tablet      predniSONE (DELTASONE) tablet   Crohn's disease     No current active flare.        Follow up plan: Return if symptoms worsen or fail to improve.

## 2014-05-28 NOTE — Assessment & Plan Note (Signed)
No current active flare.

## 2014-05-28 NOTE — Patient Instructions (Addendum)
You have persistent sinus infection. Take medicine as prescribed: zpack and prednisone course.  May use low dose morphine for breakthrough ear pain. Push fluids and plenty of rest. Nasal saline irrigation or neti pot to help drain sinuses. Let us know if persistent muffled hearing despite above treatment. May use plain mucinex with plenty of fluid to help mobilize mucous. Let us know if fever >101.5, trouble opening/closing mouth, difficulty swallowing, or worsening - you may need to be seen again.

## 2014-06-14 ENCOUNTER — Other Ambulatory Visit: Payer: Self-pay | Admitting: Family Medicine

## 2014-06-14 ENCOUNTER — Encounter: Payer: Self-pay | Admitting: Cardiovascular Disease

## 2014-06-14 NOTE — Telephone Encounter (Signed)
Pt left v/m requesting refill ambien and fioricet to [place]; pt is out of med.Please advise.

## 2014-06-14 NOTE — Telephone Encounter (Signed)
Last office visit 05/28/2014 with Dr. Danise Mina.  Ok to refill?

## 2014-06-15 NOTE — Telephone Encounter (Signed)
Called to [facility].

## 2014-06-17 ENCOUNTER — Telehealth: Payer: Self-pay | Admitting: Neurology

## 2014-06-17 NOTE — Telephone Encounter (Signed)
Patient stated she's had a migraine for two weeks and nothing has helped.  Requesting appointment with Dr Krista Blue asap.  Please call and advise.

## 2014-06-18 NOTE — Telephone Encounter (Signed)
I have left message at multiple phones numbers in system, last visit with [name] was 07/2012  Hinton Dyer, please give her an appt in my next available.  Last note in August 2013 visit,   Patient is a 38 year old right-handed Caucasian female, referred by his family care physician for intractable migraines.  She had a history of migraine headaches for many years, right retro-orbital area severe pounding headache with associated light and noise sensitivity, lasting for couple days, responded well to Amerge, she had worsening nausea warm and pain and migraine headaches during her first pregnancy 2 years ago  She is ready to conceive again, received Clomid treatment, she had a headache in August 4, failed to respond to one dose of Amerge, continuing to have headaches over the past one week, failed response to hydromorphone, Fioricet, Toradol injection, Benadryl, steroids, she presented to the emergency room August 10, was giving injection of DHE, failed to respond, induce significant nausea vomitting.  She denies significant visual loss,but had continued migraine headaches for more than one week She continued to have significant headaches, but she had LH surge, there is a concern she might get pregnant now,   She has past medical history of corhn's disease for 13 years, had a history colectomy, presenting with passing out episode for few months, she was diagnosed with neurogenic syncope, responded very well with Florinef,now she has tapered it off trying to get pregnant, she is also off immunosuppressive treatment

## 2014-06-18 NOTE — Telephone Encounter (Signed)
Spoke with patient and she is taking Fioricet-50/325 94m tablets every 6 hours, hydromorphone, and still not helping. She said that she was nauseated at night from the headaches,is making her hands numb.      Last OV notes from: 08/04/12  Comments:  38years old female, with history of migraine headaches,now with prolonged, protracted migraine headaches  1. magnesium oxide, Riboflavin as preventive treatment 2. Fioricet, Zofran, Benadryl as needed for abortive treatment. 3. hydromorphone as needed as a rescue 4 return to clinic  as needed

## 2014-06-18 NOTE — Telephone Encounter (Signed)
Spoke with patient and scheduled appt/confirmed with patient,ok'd per Dr Krista Blue

## 2014-06-21 ENCOUNTER — Ambulatory Visit: Payer: Self-pay | Admitting: Neurology

## 2014-06-22 ENCOUNTER — Ambulatory Visit (INDEPENDENT_AMBULATORY_CARE_PROVIDER_SITE_OTHER): Payer: BC Managed Care – PPO | Admitting: Neurology

## 2014-06-22 ENCOUNTER — Encounter: Payer: Self-pay | Admitting: Neurology

## 2014-06-22 VITALS — BP 112/79 | HR 98 | Ht 62.0 in | Wt 144.0 lb

## 2014-06-22 DIAGNOSIS — G43009 Migraine without aura, not intractable, without status migrainosus: Secondary | ICD-10-CM

## 2014-06-22 MED ORDER — BUTALBITAL-APAP-CAFFEINE 50-300-40 MG PO CAPS: 1.0000 | ORAL_CAPSULE | ORAL | Status: DC | PRN

## 2014-06-22 MED ORDER — NARATRIPTAN HCL 2.5 MG PO TABS: 2.5000 mg | ORAL_TABLET | ORAL | Status: DC | PRN

## 2014-06-22 NOTE — Patient Instructions (Signed)
Magnesium oxide 400 mg twice a day Riboflavin  100 mg twice a day

## 2014-06-22 NOTE — Progress Notes (Signed)
PATIENT: [name] DOB: [birth year]  HISTORICAL  MAZIAH SMOLA is a 38 years old right-handed Caucasian female, followup for headaches, her primary care physician is Dr. Eliezer Lofts.  She had a history of migraine headaches for many years, right retro-orbital area severe pounding headache with associated light and noise sensitivity, lasting for couple days, responded well to Amerge, she had worsening migraine during her previous two pregnancy, her daughter is age 26, 49 months. Fioricet normally works well for her.  She has to received Clomid treatment to get pregnant. During Clomid treatment, she has increased headaches, has to go to hospital for IV infusion, "HA cocktail".  She has past medical history of corhn's disease since age 50,  had a history colectomy, presenting with passing out episode for few months, she was diagnosed with neurogenic syncope, responded very well with Florinef, but she is not taking it any more. She has frequent lightheaded now, but has not loss conciousness.  She is also off immunosuppressive treatment, her daughter his 42-monthold, she is still breast-feeding.  She has portal cath, for continuous IV zofran infusionfor hyeremesis. during her pregnancy,  Last clinical visit was in August 2013,  for similar prolonged severe headaches, eventually responded to Amerge, Fioricet as needed  Since June 3rd 2015, she started to have bilateral frontal headaches, facial tingly, she has tried Fioricet up to 4 tablets each day, at night, she took morphine, but she continued to have daily headaches, getting worse at the end of the day, she is getting up every 3 hours, with a 917months old, she is also breast-feeding, she complains of light noise sensitivity, also noticed blurry vision, decreased peripheral visual field,    REVIEW OF SYSTEMS: Full 14 system review of systems performed and notable only for fatigue, excessive thirst, diarrhea, dizziness, lightheadedness,  headaches,  ALLERGIES: Allergies  Allergen Reactions  . Compazine [Prochlorperazine Edisylate] Anaphylaxis  . Reglan [Metoclopramide] Anaphylaxis  . Sulfonamide Derivatives Other (See Comments)    Hematemesis; documented while a young child  . Clarithromycin Other (See Comments)    Reacts with chron's disease  . Macrobid [Nitrofurantoin] Nausea And Vomiting  . Phenergan [Promethazine Hcl]     "muscle twitches"  . Codeine Nausea And Vomiting  . Hydrocodone Nausea And Vomiting  . Promethazine Hcl Other (See Comments)    Muscular seizures    HOME MEDICATIONS: Current Outpatient Prescriptions on File Prior to Visit  Medication Sig Dispense Refill  . albuterol (PROVENTIL HFA;VENTOLIN HFA) 108 (90 BASE) MCG/ACT inhaler Inhale 2 puffs into the lungs every 6 (six) hours as needed for wheezing or shortness of breath.  1 Inhaler  2  . azithromycin (ZITHROMAX Z-PAK) 250 MG tablet Two on day 1 followed by one daily for 4 days for total of 5 days, PO  6 each  0  . butalbital-acetaminophen-caffeine (FIORICET, ESGIC) 50-325-40 MG per tablet TAKE 2 TABLETS EVERY 6 HOURS AS NEEDED  20 tablet  0  . cetirizine (ZYRTEC) 10 MG tablet TAKE 1 TABLET BY MOUTH DAILY  30 tablet  5  . dexlansoprazole (DEXILANT) 60 MG capsule Take 60 mg by mouth daily.      . Fluticasone-Salmeterol (ADVAIR DISKUS) 250-50 MCG/DOSE AEPB Inhale 1 puff into the lungs 2 (two) times daily.  1 each  3  . Mesalamine (DELZICOL) 400 MG CPDR DR capsule Take 800 mg by mouth 3 (three) times daily.      .Marland Kitchenmorphine 10 MG/5ML solution Take 1 mL (2 mg total)  by mouth every 4 (four) hours as needed for severe pain.  30 mL  0  . predniSONE (DELTASONE) 20 MG tablet Take three tablets daily for 2 days followed by two tablets daily for 2 days followed by one tablet daily for 4 days  20 tablet  0  . Prenatal Vit-Fe Fumarate-FA (PRENATAL PO) Take 1 tablet by mouth daily.      . sertraline (ZOLOFT) 50 MG tablet TAKE 1 TABLET BY MOUTH DAILY  30 tablet   2  . zolpidem (AMBIEN) 10 MG tablet TAKE ONE TABLET BY MOUTH EVERY NIGHT AT BEDTIME  30 tablet  0   No current facility-administered medications on file prior to visit.    PAST MEDICAL HISTORY: Past Medical History  Diagnosis Date  . Crohn disease 2000    remission  . Asthma     no intubations or ICU stays  . Endometriosis   . Cat allergies   . Dysautonomia     recurrent syncope s/p ILR by Dr Doreatha Lew  . Palpitations   . History of recurrent UTIs   . Chronic nausea     around ovulation time    PAST SURGICAL HISTORY: Past Surgical History  Procedure Laterality Date  . Ileocolectomy    . Loop recorder implant  12/10    by DR Doreatha Lew  . Knee surgery  1990    1988  . Cesarean section      2011, emergency  CS due to placental abruption,2014  . Colon surgery  2002    partial removed due to crohns  . Appendectomy    . Tonsillectomy    . US echocardiography  11/11/2009    EF 55-60%  . [place] surgery  [phone]    due to Ectopic Pregnancy    FAMILY HISTORY: Family History  Problem Relation Age of Onset  . Hypertension Mother   . Hyperlipidemia Mother   . Arthritis Mother   . Diabetes Father   . Coronary artery disease Father   . Heart disease Father 16    MI age 57  . Hyperlipidemia Brother   . Hypertension Brother     SOCIAL HISTORY:  History   Social History  . Marital Status: Married    Spouse Name: N/A    Number of Children: N/A  . Years of Education: N/A   Occupational History  .  Other    Airline pilot   Social History Main Topics  . Smoking status: Never Smoker   . Smokeless tobacco: Never Used  . Alcohol Use: Yes     Comment: occasionally  . Drug Use: No  . Sexual Activity: Yes   Other Topics Concern  . Not on file   Social History Narrative  . No narrative on file     PHYSICAL EXAM   Filed Vitals:   06/22/14 1544  BP: 112/79  Pulse: 98  Height: 5' 2"  (1.575 m)  Weight: 144 lb (65.318 kg)    Not recorded    Body  mass index is 26.33 kg/(m^2).   Generalized: In no acute distress  Neck: Supple, no carotid bruits   Cardiac: Regular rate rhythm  Pulmonary: Clear to auscultation bilaterally  Musculoskeletal: No deformity  Neurological examination  Mentation: Alert oriented to time, place, history taking, and causual conversation  Cranial nerve II-XII: Pupils were equal round reactive to light. Extraocular movements were full.  Visual field were full on confrontational test. Bilateral fundi were blurred.  Facial sensation and strength were normal. Hearing  was intact to finger rubbing bilaterally. Uvula tongue midline.  Head turning and shoulder shrug and were normal and symmetric.Tongue protrusion into cheek strength was normal.  Motor: Normal tone, bulk and strength.  Sensory: Intact to fine touch, pinprick, preserved vibratory sensation, and proprioception at toes.  Coordination: Normal finger to nose, heel-to-shin bilaterally there was no truncal ataxia  Gait: Rising up from seated position without assistance, normal stance, without trunk ataxia, moderate stride, good arm swing, smooth turning, able to perform tiptoe, and heel walking without difficulty.   Romberg signs: Negative  Deep tendon reflexes: Brachioradialis 2/2, biceps 2/2, triceps 2/2, patellar 2/2, Achilles 2/2, plantar responses were flexor bilaterally.   DIAGNOSTIC DATA (LABS, IMAGING, TESTING) - I reviewed patient records, labs, notes, testing and imaging myself where available.  Lab Results  Component Value Date   WBC 11.1* 11/27/2013   HGB 11.2* 11/27/2013   HCT 34.5* 11/27/2013   MCV 86.9 11/27/2013   PLT 251 11/27/2013      Component Value Date/Time   NA 140 12/04/2013 1314   K 3.5 12/04/2013 1314   CL 106 12/04/2013 1314   CO2 24 12/04/2013 1314   GLUCOSE 90 12/04/2013 1314   BUN 19 12/04/2013 1314   CREATININE 0.9 12/04/2013 1314   CALCIUM 8.8 12/04/2013 1314   PROT 6.1 11/27/2013 0205   ALBUMIN 3.1*  11/27/2013 0205   AST 17 11/27/2013 0205   ALT 20 11/27/2013 0205   ALKPHOS 78 11/27/2013 0205   BILITOT 0.3 11/27/2013 0205   GFRNONAA >90 11/28/2013 0348   GFRAA >90 11/28/2013 0348   No results found for this basename: CHOL, HDL, LDLCALC, LDLDIRECT, TRIG, CHOLHDL   Lab Results  Component Value Date   HGBA1C  Value: 5.3 (NOTE)                                                                       According to the ADA Clinical Practice Recommendations for 2011, when HbA1c is used as a screening test:   >=6.5%   Diagnostic of Diabetes Mellitus           (if abnormal result  is confirmed)  5.7-6.4%   Increased risk of developing Diabetes Mellitus  References:Diagnosis and Classification of Diabetes Mellitus,Diabetes Care,2011,34(Suppl 1):S62-S69 and Standards of Medical Care in         Diabetes - 2011,Diabetes BHAL,9379,02  (Suppl 1):S11-S61. 07/15/2010   No results found for this basename: [id number]   Lab Results  Component Value Date   TSH 0.429 11/27/2013    ASSESSMENT AND PLAN  SHARNAE WINFREE is a 38 y.o. female complains of  frequent headaches since June third 2015, she is currently breast-feeding, also with history of Crohn's disease, status post colectomy, orthostatic dizziness, previously responding well to beta blocker as migraine prevention, no longer a good candidate because of her orthostatic symptoms, she is also breast-feeding, not a good candidate for antiepileptic medications, she complains of blurry vision, there was a possibility of bilateral papillary edema on funduscopy examination, she had a history of iritis before, was evaluated by ophthalmologist Dr. Silvestre Gunner in the past.  1. her migraine pattern has changed, there is also a possibility of papillary edema, we will proceed with MRI of the brain with  and without contrast, refer her to Dr. drop formal funduscopy examination, and visual field testing, 2 in the meantime, treat her with Amerge as needed, Fioricet as needed as migraine  abortion, 3 return to clinic in 2-3 weeks, if she truly has evidence of increased intracranial pressure, may consider lumbar puncture  4. I also Rx Cefaly as migraine prevention.    Marcial Pacas, M.D. Ph.D.  [facility] [address] [place], [place] [address] [phone]

## 2014-06-23 ENCOUNTER — Telehealth: Payer: Self-pay | Admitting: Neurology

## 2014-06-23 MED ORDER — ALPRAZOLAM 1 MG PO TABS: ORAL_TABLET | ORAL | Status: DC

## 2014-06-23 NOTE — Telephone Encounter (Signed)
[name], please call in xanax 14m one po prn 30 minutes before MRI, may repeat once. For total of 3 tabs with out refill.  She needs people to drive her after taking xanax.

## 2014-06-24 NOTE — Telephone Encounter (Signed)
I called pharmacy.  Left Rx in a message on Dr line.

## 2014-06-30 ENCOUNTER — Ambulatory Visit (INDEPENDENT_AMBULATORY_CARE_PROVIDER_SITE_OTHER): Payer: BC Managed Care – PPO

## 2014-06-30 DIAGNOSIS — G43009 Migraine without aura, not intractable, without status migrainosus: Secondary | ICD-10-CM

## 2014-06-30 MED ORDER — GADOPENTETATE DIMEGLUMINE 469.01 MG/ML IV SOLN: 14.0000 mL | Freq: Once | INTRAVENOUS | Status: AC | PRN

## 2014-07-02 NOTE — Progress Notes (Signed)
Quick Note:  Shared normal MR Brain thru VM message ______

## 2014-07-02 NOTE — Progress Notes (Signed)
Quick Note:  Shared normal MR Brain results thru VM message ______

## 2014-07-06 ENCOUNTER — Encounter: Payer: Self-pay | Admitting: Neurology

## 2014-07-06 ENCOUNTER — Ambulatory Visit (INDEPENDENT_AMBULATORY_CARE_PROVIDER_SITE_OTHER): Payer: BC Managed Care – PPO | Admitting: Neurology

## 2014-07-06 VITALS — BP 114/82 | HR 89 | Ht 62.0 in | Wt 146.0 lb

## 2014-07-06 DIAGNOSIS — G43009 Migraine without aura, not intractable, without status migrainosus: Secondary | ICD-10-CM

## 2014-07-06 NOTE — Progress Notes (Signed)
PATIENT: [name] DOB: 03/11/1976  HISTORICAL  [name] is a 38 years old right-handed Caucasian female, followup for headaches, her primary care physician is Dr. Eliezer Lofts.  She had a history of migraine headaches for many years, right retro-orbital area severe pounding headache with associated light and noise sensitivity, lasting for couple days, responded well to Amerge, she had worsening migraine during her previous two pregnancy, her daughter is age 33, 64 months. Fioricet normally works well for her.  She has to received Clomid treatment to get pregnant. During Clomid treatment, she has increased headaches, has to go to hospital for IV infusion, "HA cocktail".  She has past medical history of corhn's disease since age 68,  had a history colectomy, presenting with passing out episode for few months, she was diagnosed with neurogenic syncope, responded very well with Florinef, but she is not taking it any more. She has frequent lightheaded now, but has not loss conciousness.  She is also off immunosuppressive treatment, her daughter his 18-monthold, she is still breast-feeding.  She has portal cath, for continuous IV zofran infusionfor hyeremesis. during her pregnancy,  Last clinical visit was in August 2013,  for similar prolonged severe headaches, eventually responded to Amerge, Fioricet as needed  Since June 3rd 2015, she started to have bilateral frontal headaches, facial tingly, she has tried Fioricet up to 4 tablets each day, at night, she took morphine, but she continued to have daily headaches, getting worse at the end of the day, she is getting up every 3 hours, with a 975 monthsold, she is also breast-feeding, she complains of light noise sensitivity, also noticed blurry vision, decreased peripheral visual field,   UPDATE July 14th 2015:  She had a Crohn's flareup about a weeks ago, received steroid treatment, was evaluated by her ophthalmologist in July 06 2014, Dr.  KSilvestre Gunnerfunduscopy examination showed bilateral optic nerve disc fuzzy, but have always been, compared to 2008, looks like there was no significant changes.  MRI brain was normal.   She still has headaches, almost daily, bilateral frontal or right, she is taking magnesium oxide, riboflavin does not help.   REVIEW OF SYSTEMS: Full 14 system review of systems performed and notable only for fatigue, excessive thirst, diarrhea, dizziness, lightheadedness, headaches,  ALLERGIES: Allergies  Allergen Reactions  . Compazine [Prochlorperazine Edisylate] Anaphylaxis  . Reglan [Metoclopramide] Anaphylaxis    Other reaction(s): GI Upset (intolerance) Intensifies her Chron's  . Sulfonamide Derivatives Other (See Comments)    Hematemesis; documented while a young child  . Clarithromycin Other (See Comments)    Reacts with chron's disease  . Macrobid [Nitrofurantoin] Nausea And Vomiting  . Phenergan [Promethazine Hcl]     "muscle twitches"  . Sulfa Antibiotics Hives    Hematemesis; documented while a young child  . Codeine Nausea And Vomiting    Dilaudid and demerol OK  . Hydrocodone Nausea And Vomiting  . Promethazine Rash    Muscular seizures  . Promethazine Hcl Other (See Comments)    Muscular seizures  . Sulfamethoxazole Rash    Internal bleeding and kidney infection    HOME MEDICATIONS: Current Outpatient Prescriptions on File Prior to Visit  Medication Sig Dispense Refill  . albuterol (PROVENTIL HFA;VENTOLIN HFA) 108 (90 BASE) MCG/ACT inhaler Inhale 2 puffs into the lungs every 6 (six) hours as needed for wheezing or shortness of breath.  1 Inhaler  2  . ALPRAZolam (XANAX) 1 MG tablet One po 30 minutes prn before MRI,  may repeat once if needed  3 tablet  0  . butalbital-acetaminophen-caffeine (FIORICET, ESGIC) 50-325-40 MG per tablet TAKE 2 TABLETS EVERY 6 HOURS AS NEEDED  20 tablet  0  . Butalbital-APAP-Caffeine 50-300-40 MG CAPS Take 1 tablet by mouth as needed.  60 capsule  1  .  cetirizine (ZYRTEC) 10 MG tablet TAKE 1 TABLET BY MOUTH DAILY  30 tablet  5  . dexlansoprazole (DEXILANT) 60 MG capsule Take 60 mg by mouth daily.      Marland Kitchen doxycycline (VIBRA-TABS) 100 MG tablet 100 mg daily.      . Fluticasone-Salmeterol (ADVAIR DISKUS) 250-50 MCG/DOSE AEPB Inhale 1 puff into the lungs 2 (two) times daily.  1 each  3  . Mesalamine (DELZICOL) 400 MG CPDR DR capsule Take 800 mg by mouth 3 (three) times daily.      Marland Kitchen morphine 10 MG/5ML solution Take 1 mL (2 mg total) by mouth every 4 (four) hours as needed for severe pain.  30 mL  0  . naratriptan (AMERGE) 2.5 MG tablet Take 1 tablet (2.5 mg total) by mouth as needed for migraine. Take one (1) tablet at onset of headache; if returns or does not resolve, may repeat after 4 hours; do not exceed five (5) mg in 24 hours.  15 tablet  6  . Prenatal Vit-Fe Fumarate-FA (PRENATAL PO) Take 1 tablet by mouth daily.      . sertraline (ZOLOFT) 50 MG tablet TAKE 1 TABLET BY MOUTH DAILY  30 tablet  2  . zolpidem (AMBIEN) 10 MG tablet TAKE ONE TABLET BY MOUTH EVERY NIGHT AT BEDTIME  30 tablet  0   No current facility-administered medications on file prior to visit.    PAST MEDICAL HISTORY: Past Medical History  Diagnosis Date  . Crohn disease 2000    remission  . Asthma     no intubations or ICU stays  . Endometriosis   . Cat allergies   . Dysautonomia     recurrent syncope s/p ILR by Dr Doreatha Lew  . Palpitations   . History of recurrent UTIs   . Chronic nausea     around ovulation time    PAST SURGICAL HISTORY: Past Surgical History  Procedure Laterality Date  . Ileocolectomy    . Loop recorder implant  12/10    by DR Doreatha Lew  . Knee surgery  1990    1988  . Cesarean section      2011, emergency  CS due to placental abruption,2014  . Colon surgery  2002    partial removed due to crohns  . Appendectomy    . Tonsillectomy    . US echocardiography  11/11/2009    EF 55-60%  . [place] surgery  [phone]    due to Ectopic  Pregnancy    FAMILY HISTORY: Family History  Problem Relation Age of Onset  . Hypertension Mother   . Hyperlipidemia Mother   . Arthritis Mother   . Diabetes Father   . Coronary artery disease Father   . Heart disease Father 31    MI age 42  . Hyperlipidemia Brother   . Hypertension Brother     SOCIAL HISTORY:  History   Social History  . Marital Status: Married    Spouse Name: Jonni Sanger    Number of Children: 2  . Years of Education: college   Occupational History  .  Other    Airline pilot  .  [place] History Main Topics  .  Smoking status: Never Smoker   . Smokeless tobacco: Never Used  . Alcohol Use: Yes     Comment: occasionally  . Drug Use: No  . Sexual Activity: Yes   Other Topics Concern  . Not on file   Social History Narrative   Patient lives at home with her husband Jonni Sanger). Patient works full time for [employer].    Education [employer].   Right handed.   Caffeine one cup of coffee daily.     PHYSICAL EXAM   Filed Vitals:   07/06/14 1420  BP: 114/82  Pulse: 89  Height: 5' 2"  (1.575 m)  Weight: 146 lb (66.225 kg)    Not recorded    Body mass index is 26.7 kg/(m^2).   Generalized: In no acute distress  Neck: Supple, no carotid bruits   Cardiac: Regular rate rhythm  Pulmonary: Clear to auscultation bilaterally  Musculoskeletal: No deformity  Neurological examination  Mentation: Alert oriented to time, place, history taking, and causual conversation  Cranial nerve II-XII: Pupils were equal round reactive to light. Extraocular movements were full.  Visual field were full on confrontational test. Bilateral fundi were blurred.  Facial sensation and strength were normal. Hearing was intact to finger rubbing bilaterally. Uvula tongue midline.  Head turning and shoulder shrug and were normal and symmetric.Tongue protrusion into cheek strength was normal.  Motor: Normal tone, bulk and  strength.  Sensory: Intact to fine touch, pinprick, preserved vibratory sensation, and proprioception at toes.  Coordination: Normal finger to nose, heel-to-shin bilaterally there was no truncal ataxia  Gait: Rising up from seated position without assistance, normal stance, without trunk ataxia, moderate stride, good arm swing, smooth turning, able to perform tiptoe, and heel walking without difficulty.   Romberg signs: Negative  Deep tendon reflexes: Brachioradialis 2/2, biceps 2/2, triceps 2/2, patellar 2/2, Achilles 2/2, plantar responses were flexor bilaterally.   DIAGNOSTIC DATA (LABS, IMAGING, TESTING) - I reviewed patient records, labs, notes, testing and imaging myself where available.  Lab Results  Component Value Date   WBC 11.1* 11/27/2013   HGB 11.2* 11/27/2013   HCT 34.5* 11/27/2013   MCV 86.9 11/27/2013   PLT 251 11/27/2013      Component Value Date/Time   NA 140 12/04/2013 1314   K 3.5 12/04/2013 1314   CL 106 12/04/2013 1314   CO2 24 12/04/2013 1314   GLUCOSE 90 12/04/2013 1314   BUN 19 12/04/2013 1314   CREATININE 0.9 12/04/2013 1314   CALCIUM 8.8 12/04/2013 1314   PROT 6.1 11/27/2013 0205   ALBUMIN 3.1* 11/27/2013 0205   AST 17 11/27/2013 0205   ALT 20 11/27/2013 0205   ALKPHOS 78 11/27/2013 0205   BILITOT 0.3 11/27/2013 0205   GFRNONAA >90 11/28/2013 0348   GFRAA >90 11/28/2013 0348   No results found for this basename: CHOL,  HDL,  LDLCALC,  LDLDIRECT,  TRIG,  CHOLHDL   Lab Results  Component Value Date   HGBA1C  Value: 5.3 (NOTE)                                                                       According to the ADA Clinical Practice Recommendations for 2011, when HbA1c is used as a screening test:   >=  6.5%   Diagnostic of Diabetes Mellitus           (if abnormal result  is confirmed)  5.7-6.4%   Increased risk of developing Diabetes Mellitus  References:Diagnosis and Classification of Diabetes Mellitus,Diabetes Care,2011,34(Suppl 1):S62-S69 and Standards of  Medical Care in         Diabetes - 2011,Diabetes ZOXW,9604,54  (Suppl 1):S11-S61. 07/15/2010   No results found for this basename: [id number]   Lab Results  Component Value Date   TSH 0.429 11/27/2013    ASSESSMENT AND PLAN  [name] is a 38 y.o. female complains of  frequent headaches since June third 2015, she is currently breast-feeding, also with history of Crohn's disease, status post colectomy, orthostatic dizziness, previously responding well to beta blocker as migraine prevention, no longer a good candidate because of her orthostatic symptoms, she is also breast-feeding, not a good candidate for antiepileptic medications, she complains of blurry vision, mild fuzzy optic disc but no change compared to 2008, normal MRI brain. Her headache overall has mild improvement with steroid treatment for her Crohn's disease, she has tried magnesium, without helping,  Continue Amerge, Fioricet as needed, return to clinic in one year, if she is out of nursing, still has frequent headaches, may consider nortriptyline, or anti- epileptic medications  RTC in one year     Marcial Pacas, M.D. Ph.D.  [facility] [address], [place], [place] [address] [phone]

## 2014-07-13 ENCOUNTER — Other Ambulatory Visit: Payer: Self-pay | Admitting: Family Medicine

## 2014-07-13 NOTE — Telephone Encounter (Signed)
Last office visit 06/14/2014 with Dr. Danise Mina.  Last refilled 06/15/2014 for #30 with no refills.  Ok to refill?

## 2014-07-15 NOTE — Telephone Encounter (Signed)
Called to [facility].

## 2014-07-28 ENCOUNTER — Other Ambulatory Visit: Payer: Self-pay | Admitting: Family Medicine

## 2014-08-16 ENCOUNTER — Other Ambulatory Visit: Payer: Self-pay | Admitting: Family Medicine

## 2014-08-16 NOTE — Telephone Encounter (Signed)
Called to [facility].

## 2014-08-16 NOTE — Telephone Encounter (Signed)
Last office visit 05/28/2014 with Dr. Danise Mina.  Last refilled 07/15/2014 for #30 with no refills.  Ok to refill?

## 2014-08-20 ENCOUNTER — Encounter: Payer: Self-pay | Admitting: Family Medicine

## 2014-08-20 ENCOUNTER — Telehealth: Payer: Self-pay

## 2014-08-20 ENCOUNTER — Ambulatory Visit (INDEPENDENT_AMBULATORY_CARE_PROVIDER_SITE_OTHER): Payer: BC Managed Care – PPO | Admitting: Family Medicine

## 2014-08-20 VITALS — BP 124/86 | HR 88 | Temp 98.1°F | Ht 62.0 in | Wt 146.2 lb

## 2014-08-20 DIAGNOSIS — R350 Frequency of micturition: Secondary | ICD-10-CM

## 2014-08-20 DIAGNOSIS — N10 Acute tubulo-interstitial nephritis: Secondary | ICD-10-CM

## 2014-08-20 DIAGNOSIS — N200 Calculus of kidney: Secondary | ICD-10-CM

## 2014-08-20 LAB — POCT URINALYSIS DIPSTICK
Bilirubin, UA: NEGATIVE
Glucose, UA: NEGATIVE
Ketones, UA: NEGATIVE
Leukocytes, UA: NEGATIVE
Nitrite, UA: NEGATIVE
PH UA: 6
UROBILINOGEN UA: 0.2

## 2014-08-20 MED ORDER — PHENAZOPYRIDINE HCL 200 MG PO TABS: 200.0000 mg | ORAL_TABLET | Freq: Three times a day (TID) | ORAL | Status: DC | PRN

## 2014-08-20 MED ORDER — CIPROFLOXACIN HCL 500 MG PO TABS: 500.0000 mg | ORAL_TABLET | Freq: Two times a day (BID) | ORAL | Status: DC

## 2014-08-20 NOTE — Telephone Encounter (Signed)
Noted  

## 2014-08-20 NOTE — Patient Instructions (Addendum)
Will treat with antibiotics.  Push fluids and  Use pyridium for pain as needed.  We will call with culture results. If not improving over weekend, call for further eval for stones next week.  If fever or constant vomiting, go to ER.

## 2014-08-20 NOTE — Assessment & Plan Note (Signed)
Possible stone involvement given history and blood in stool. If not improving with antibiotics consider eval with KUB.

## 2014-08-20 NOTE — Telephone Encounter (Signed)
Pt is sending a urine specimen to office by her mother; pt scheduled appt today at 4 pm for low back pain, burning upon urination and frequency.

## 2014-08-20 NOTE — Progress Notes (Signed)
Pre visit review using our clinic review tool, if applicable. No additional management support is needed unless otherwise documented below in the visit note. 

## 2014-08-20 NOTE — Assessment & Plan Note (Addendum)
Will treat with antibiotics. Fluids and pyridium.  If fever or constant vomiting, go to ER.   Will send for culture. Less likely kidney stone. Pt this does not feel like kidney stones she has had in apst.

## 2014-08-20 NOTE — Progress Notes (Signed)
   Subjective:    Patient ID: [name], female    DOB: 06/15/1976, 38 y.o.   MRN: [id number]  HPI  38 year old female presents with new onset low back pain x 4 days, urinary urgency and frequency, dysuria  X 2 days Decrease flow.  Some associated nausea. No vomiting. Drinking fluids.  She has recurrent UTIs every yer when going back to school. 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 She does have history of kidney stones.   Has been using tylenol. Heating pad. No fever.  She did take one of her sons expired keflex 500 mg last night.    Review of Systems  Constitutional: Negative for fever and fatigue.  HENT: Negative for ear pain.   Eyes: Negative for pain.  Respiratory: Negative for chest tightness and shortness of breath.   Cardiovascular: Negative for chest pain, palpitations and leg swelling.  Gastrointestinal: Negative for abdominal pain.  Genitourinary: Negative for dysuria.       Objective:   Physical Exam  Constitutional: Vital signs are normal. She appears well-developed and well-nourished. She is cooperative.  Non-toxic appearance. She does not appear ill. No distress.  HENT:  Head: Normocephalic.  Right Ear: Hearing, tympanic membrane, external ear and ear canal normal. Tympanic membrane is not erythematous, not retracted and not bulging.  Left Ear: Hearing, tympanic membrane, external ear and ear canal normal. Tympanic membrane is not erythematous, not retracted and not bulging.  Nose: No mucosal edema or rhinorrhea. Right sinus exhibits no maxillary sinus tenderness and no frontal sinus tenderness. Left sinus exhibits no maxillary sinus tenderness and no frontal sinus tenderness.  Mouth/Throat: Uvula is midline, oropharynx is clear and moist and mucous membranes are normal.  Eyes: Conjunctivae, EOM and lids are normal. Pupils are equal, round, and reactive to light. Lids are everted and swept, no foreign bodies found.  Neck: Trachea normal and normal range of  motion. Neck supple. Carotid bruit is not present. No mass and no thyromegaly present.  Cardiovascular: Normal rate, regular rhythm, S1 normal, S2 normal, normal heart sounds, intact distal pulses and normal pulses.  Exam reveals no gallop and no friction rub.   No murmur heard. Pulmonary/Chest: Effort normal and breath sounds normal. Not tachypneic. No respiratory distress. She has no decreased breath sounds. She has no wheezes. She has no rhonchi. She has no rales.  Abdominal: Soft. Normal appearance and bowel sounds are normal. There is tenderness in the suprapubic area. There is CVA tenderness.  Neurological: She is alert.  Skin: Skin is warm, dry and intact. No rash noted.  Psychiatric: Her speech is normal and behavior is normal. Judgment and thought content normal. Her mood appears not anxious. Cognition and memory are normal. She does not exhibit a depressed mood.          Assessment & Plan:

## 2014-08-22 LAB — URINE CULTURE
Colony Count: NO GROWTH
Organism ID, Bacteria: NO GROWTH

## 2014-08-24 ENCOUNTER — Emergency Department (HOSPITAL_COMMUNITY): Payer: BC Managed Care – PPO

## 2014-08-24 ENCOUNTER — Encounter (HOSPITAL_COMMUNITY): Payer: Self-pay | Admitting: Emergency Medicine

## 2014-08-24 ENCOUNTER — Telehealth: Payer: Self-pay | Admitting: Neurology

## 2014-08-24 ENCOUNTER — Observation Stay (HOSPITAL_COMMUNITY)
Admission: EM | Admit: 2014-08-24 | Discharge: 2014-08-25 | Disposition: A | Discharge status: Home / Self Care | Payer: BC Managed Care – PPO | Attending: Internal Medicine | Admitting: Internal Medicine

## 2014-08-24 ENCOUNTER — Observation Stay (HOSPITAL_COMMUNITY): Payer: BC Managed Care – PPO

## 2014-08-24 DIAGNOSIS — F411 Generalized anxiety disorder: Secondary | ICD-10-CM

## 2014-08-24 DIAGNOSIS — R000 Tachycardia, unspecified: Secondary | ICD-10-CM

## 2014-08-24 DIAGNOSIS — N809 Endometriosis, unspecified: Secondary | ICD-10-CM | POA: Diagnosis present

## 2014-08-24 DIAGNOSIS — K5090 Crohn's disease, unspecified, without complications: Secondary | ICD-10-CM | POA: Diagnosis not present

## 2014-08-24 DIAGNOSIS — H8110 Benign paroxysmal vertigo, unspecified ear: Secondary | ICD-10-CM

## 2014-08-24 DIAGNOSIS — N200 Calculus of kidney: Secondary | ICD-10-CM

## 2014-08-24 DIAGNOSIS — Z792 Long term (current) use of antibiotics: Secondary | ICD-10-CM | POA: Insufficient documentation

## 2014-08-24 DIAGNOSIS — R7989 Other specified abnormal findings of blood chemistry: Secondary | ICD-10-CM

## 2014-08-24 DIAGNOSIS — J45909 Unspecified asthma, uncomplicated: Secondary | ICD-10-CM | POA: Diagnosis not present

## 2014-08-24 DIAGNOSIS — F419 Anxiety disorder, unspecified: Secondary | ICD-10-CM

## 2014-08-24 DIAGNOSIS — G909 Disorder of the autonomic nervous system, unspecified: Secondary | ICD-10-CM | POA: Insufficient documentation

## 2014-08-24 DIAGNOSIS — Z87440 Personal history of urinary (tract) infections: Secondary | ICD-10-CM | POA: Insufficient documentation

## 2014-08-24 DIAGNOSIS — K529 Noninfective gastroenteritis and colitis, unspecified: Secondary | ICD-10-CM

## 2014-08-24 DIAGNOSIS — J189 Pneumonia, unspecified organism: Secondary | ICD-10-CM

## 2014-08-24 DIAGNOSIS — Z79899 Other long term (current) drug therapy: Secondary | ICD-10-CM | POA: Diagnosis not present

## 2014-08-24 DIAGNOSIS — G43009 Migraine without aura, not intractable, without status migrainosus: Secondary | ICD-10-CM

## 2014-08-24 DIAGNOSIS — Z8742 Personal history of other diseases of the female genital tract: Secondary | ICD-10-CM | POA: Insufficient documentation

## 2014-08-24 DIAGNOSIS — R945 Abnormal results of liver function studies: Secondary | ICD-10-CM

## 2014-08-24 DIAGNOSIS — N10 Acute tubulo-interstitial nephritis: Secondary | ICD-10-CM

## 2014-08-24 DIAGNOSIS — K219 Gastro-esophageal reflux disease without esophagitis: Secondary | ICD-10-CM | POA: Diagnosis present

## 2014-08-24 DIAGNOSIS — Z8669 Personal history of other diseases of the nervous system and sense organs: Secondary | ICD-10-CM

## 2014-08-24 DIAGNOSIS — M0230 Reiter's disease, unspecified site: Secondary | ICD-10-CM

## 2014-08-24 DIAGNOSIS — J4540 Moderate persistent asthma, uncomplicated: Secondary | ICD-10-CM | POA: Diagnosis present

## 2014-08-24 DIAGNOSIS — R110 Nausea: Secondary | ICD-10-CM | POA: Insufficient documentation

## 2014-08-24 DIAGNOSIS — E876 Hypokalemia: Secondary | ICD-10-CM

## 2014-08-24 DIAGNOSIS — K50919 Crohn's disease, unspecified, with unspecified complications: Secondary | ICD-10-CM

## 2014-08-24 DIAGNOSIS — G43909 Migraine, unspecified, not intractable, without status migrainosus: Secondary | ICD-10-CM | POA: Diagnosis present

## 2014-08-24 DIAGNOSIS — R42 Dizziness and giddiness: Secondary | ICD-10-CM | POA: Diagnosis present

## 2014-08-24 HISTORY — DX: Gastro-esophageal reflux disease without esophagitis: K21.9

## 2014-08-24 HISTORY — DX: Dizziness and giddiness: R42

## 2014-08-24 HISTORY — DX: Anxiety disorder, unspecified: F41.9

## 2014-08-24 LAB — URINALYSIS, ROUTINE W REFLEX MICROSCOPIC
Bilirubin Urine: NEGATIVE
Glucose, UA: NEGATIVE mg/dL
Ketones, ur: NEGATIVE mg/dL
LEUKOCYTES UA: NEGATIVE
Nitrite: NEGATIVE
PROTEIN: NEGATIVE mg/dL
Specific Gravity, Urine: 1.02 (ref 1.005–1.030)
Urobilinogen, UA: 0.2 mg/dL (ref 0.0–1.0)
pH: 5.5 (ref 5.0–8.0)

## 2014-08-24 LAB — BASIC METABOLIC PANEL
ANION GAP: 12 (ref 5–15)
BUN: 13 mg/dL (ref 6–23)
CALCIUM: 8.2 mg/dL — AB (ref 8.4–10.5)
CO2: 21 mEq/L (ref 19–32)
CREATININE: 0.61 mg/dL (ref 0.50–1.10)
Chloride: 105 mEq/L (ref 96–112)
GFR calc non Af Amer: >90 mL/min (ref >90)
Glucose, Bld: 109 mg/dL — ABNORMAL HIGH (ref 70–99)
Potassium: 4 mEq/L (ref 3.7–5.3)
Sodium: 138 mEq/L (ref 137–147)

## 2014-08-24 LAB — CBC
HCT: 35.6 % — ABNORMAL LOW (ref 36.0–46.0)
Hemoglobin: 11.7 g/dL — ABNORMAL LOW (ref 12.0–15.0)
MCH: 28 pg (ref 26.0–34.0)
MCHC: 32.9 g/dL (ref 30.0–36.0)
MCV: 85.2 fL (ref 78.0–100.0)
PLATELETS: 273 10*3/uL (ref 150–400)
RBC: 4.18 MIL/uL (ref 3.87–5.11)
RDW: 13.6 % (ref 11.5–15.5)
WBC: 6.7 10*3/uL (ref 4.0–10.5)

## 2014-08-24 LAB — URINE MICROSCOPIC-ADD ON

## 2014-08-24 LAB — I-STAT TROPONIN, ED: Troponin i, poc: 0 ng/mL (ref 0.00–0.08)

## 2014-08-24 MED ORDER — PANTOPRAZOLE SODIUM 40 MG PO TBEC
40.0000 mg | DELAYED_RELEASE_TABLET | Freq: Every day | ORAL | Status: DC
  Administered 2014-08-24 – 2014-08-25 (×2): 40 mg via ORAL
  Filled 2014-08-24 (×2): qty 1

## 2014-08-24 MED ORDER — DIAZEPAM 5 MG/ML IJ SOLN
5.0000 mg | Freq: Once | INTRAMUSCULAR | Status: AC
  Administered 2014-08-24: 5 mg via INTRAVENOUS
  Filled 2014-08-24: qty 2

## 2014-08-24 MED ORDER — MECLIZINE HCL 25 MG PO TABS
50.0000 mg | ORAL_TABLET | Freq: Three times a day (TID) | ORAL | Status: DC
  Administered 2014-08-24 – 2014-08-25 (×3): 50 mg via ORAL
  Filled 2014-08-24 (×6): qty 2

## 2014-08-24 MED ORDER — SODIUM CHLORIDE 0.9 % IV BOLUS (SEPSIS)
1000.0000 mL | Freq: Once | INTRAVENOUS | Status: AC
  Administered 2014-08-24: 1000 mL via INTRAVENOUS

## 2014-08-24 MED ORDER — ONDANSETRON HCL 4 MG/2ML IJ SOLN
4.0000 mg | Freq: Once | INTRAMUSCULAR | Status: AC
  Administered 2014-08-24: 4 mg via INTRAVENOUS
  Filled 2014-08-24: qty 2

## 2014-08-24 MED ORDER — ONDANSETRON HCL 4 MG PO TABS: 4.0000 mg | ORAL_TABLET | Freq: Four times a day (QID) | ORAL | Status: DC | PRN

## 2014-08-24 MED ORDER — ZOLPIDEM TARTRATE 5 MG PO TABS: 10.0000 mg | ORAL_TABLET | Freq: Once | ORAL | Status: DC

## 2014-08-24 MED ORDER — SUMATRIPTAN SUCCINATE 50 MG PO TABS
50.0000 mg | ORAL_TABLET | Freq: Four times a day (QID) | ORAL | Status: DC | PRN
  Filled 2014-08-24: qty 1

## 2014-08-24 MED ORDER — ZOLPIDEM TARTRATE 5 MG PO TABS
5.0000 mg | ORAL_TABLET | Freq: Once | ORAL | Status: AC
  Administered 2014-08-24: 5 mg via ORAL
  Filled 2014-08-24: qty 1

## 2014-08-24 MED ORDER — GUAIFENESIN-DM 100-10 MG/5ML PO SYRP
5.0000 mL | ORAL_SOLUTION | ORAL | Status: DC | PRN
  Filled 2014-08-24: qty 5

## 2014-08-24 MED ORDER — MAGNESIUM SULFATE IN D5W 10-5 MG/ML-% IV SOLN
1.0000 g | Freq: Once | INTRAVENOUS | Status: AC
Start: 2014-08-24 — End: 2014-08-24
  Administered 2014-08-24: 1 g via INTRAVENOUS
  Filled 2014-08-24: qty 100

## 2014-08-24 MED ORDER — MECLIZINE HCL 25 MG PO TABS
50.0000 mg | ORAL_TABLET | Freq: Once | ORAL | Status: AC
  Administered 2014-08-24: 50 mg via ORAL
  Filled 2014-08-24: qty 2

## 2014-08-24 MED ORDER — LORAZEPAM 2 MG/ML IJ SOLN
0.5000 mg | INTRAMUSCULAR | Status: DC | PRN
  Administered 2014-08-24: 0.5 mg via INTRAVENOUS
  Filled 2014-08-24: qty 1

## 2014-08-24 MED ORDER — ONDANSETRON HCL 4 MG/2ML IJ SOLN: 4.0000 mg | Freq: Four times a day (QID) | INTRAMUSCULAR | Status: DC | PRN

## 2014-08-24 MED ORDER — POTASSIUM CHLORIDE IN NACL 40-0.9 MEQ/L-% IV SOLN
INTRAVENOUS | Status: AC
  Administered 2014-08-24 – 2014-08-25 (×2): 75 mL/h via INTRAVENOUS
  Filled 2014-08-24 (×2): qty 1000

## 2014-08-24 MED ORDER — POLYETHYLENE GLYCOL 3350 17 G PO PACK
17.0000 g | PACK | Freq: Every day | ORAL | Status: DC | PRN
  Filled 2014-08-24: qty 1

## 2014-08-24 MED ORDER — HYDROMORPHONE HCL 2 MG PO TABS: 2.0000 mg | ORAL_TABLET | Freq: Four times a day (QID) | ORAL | Status: DC | PRN

## 2014-08-24 MED ORDER — PRENATAL 27-0.8 MG PO TABS
1.0000 | ORAL_TABLET | Freq: Every day | ORAL | Status: DC
  Filled 2014-08-24 (×2): qty 1

## 2014-08-24 MED ORDER — SODIUM CHLORIDE 0.9 % IJ SOLN: 3.0000 mL | Freq: Two times a day (BID) | INTRAMUSCULAR | Status: DC

## 2014-08-24 MED ORDER — LORAZEPAM 2 MG/ML IJ SOLN
1.0000 mg | Freq: Once | INTRAMUSCULAR | Status: AC
  Administered 2014-08-24: 1 mg via INTRAVENOUS
  Filled 2014-08-24: qty 1

## 2014-08-24 MED ORDER — BUTALBITAL-APAP-CAFFEINE 50-325-40 MG PO TABS: 2.0000 | ORAL_TABLET | Freq: Two times a day (BID) | ORAL | Status: DC | PRN

## 2014-08-24 MED ORDER — LORATADINE 10 MG PO TABS
10.0000 mg | ORAL_TABLET | Freq: Every day | ORAL | Status: DC
  Filled 2014-08-24 (×2): qty 1

## 2014-08-24 MED ORDER — PHENAZOPYRIDINE HCL 200 MG PO TABS
200.0000 mg | ORAL_TABLET | Freq: Three times a day (TID) | ORAL | Status: DC
  Filled 2014-08-24 (×6): qty 1

## 2014-08-24 MED ORDER — SERTRALINE HCL 50 MG PO TABS
50.0000 mg | ORAL_TABLET | Freq: Every day | ORAL | Status: DC
  Administered 2014-08-24 – 2014-08-25 (×2): 50 mg via ORAL
  Filled 2014-08-24 (×3): qty 1

## 2014-08-24 NOTE — ED Notes (Signed)
Pt states that she was recently treated with cipro for a kidney infection.

## 2014-08-24 NOTE — ED Notes (Signed)
Patient states she started having vertigo last night.   Patient states has had vertigo previously, but worse today.   Patient states unable to walk, stand up or move around without getting sick on stomach.

## 2014-08-24 NOTE — ED Provider Notes (Signed)
CSN: [id number]     Arrival date & time 08/24/14  0735 History   First MD Initiated Contact with Patient 08/24/14 [id number]     Chief Complaint  Patient presents with  . Dizziness     (Consider location/radiation/quality/duration/timing/severity/associated sxs/prior Treatment) HPI Comments: Patient is a 38 year old female with a past medical history of Crohn's disease, asthma, dysautonomia, endometriosis and chronic nausea who presents to the emergency department complaining of an exacerbation of her vertigo beginning in the middle of the night when she woke up to use the restroom. Patient reports she is feeling fine prior to going to sleep, however when she woke up to go to the bathroom she felt very dizzy and unsteady on her feet and was walking into many things. She describes her dizziness as she is very wobbly. Admits to associated nausea without vomiting. States she feels very nauseated and has worsening dizziness gwhen she sits upright. Denies headache, vision change, tinnitus, confusion, chest pain, shortness of breath, fever or chills. She has had 3 brain MRI is in the past for the same symptoms, and 2012, 2013 in last July 2015, all of which were normal. She has seen neurology in the past, Dr. Krista Blue, who had patient do vestibular therapy with great relief of her symptoms. States she was on meclizine in the past which made her drowsy. She is requesting someone to "knock her crystals back into place".  Patient is a 38 y.o. female presenting with dizziness. The history is provided by the patient and the spouse.  Dizziness Associated symptoms: nausea     Past Medical History  Diagnosis Date  . Crohn disease 2000    remission  . Asthma     no intubations or ICU stays  . Endometriosis   . Cat allergies   . Dysautonomia     recurrent syncope s/p ILR by Dr Doreatha Lew  . Palpitations   . History of recurrent UTIs   . Chronic nausea     around ovulation time   Past Surgical History  Procedure  Laterality Date  . Ileocolectomy    . Loop recorder implant  12/10    by DR Doreatha Lew  . Knee surgery  1990    1988  . Cesarean section      2011, emergency  CS due to placental abruption,2014  . Colon surgery  2002    partial removed due to crohns  . Appendectomy    . Tonsillectomy    . US echocardiography  11/11/2009    EF 55-60%  . Glasgow surgery  [phone]    due to Ectopic Pregnancy   Family History  Problem Relation Age of Onset  . Hypertension Mother   . Hyperlipidemia Mother   . Arthritis Mother   . Diabetes Father   . Coronary artery disease Father   . Heart disease Father 23    MI age 67  . Hyperlipidemia Brother   . Hypertension Brother    History  Substance Use Topics  . Smoking status: Never Smoker   . Smokeless tobacco: Never Used  . Alcohol Use: No     Comment: occasionally   OB History   Grav Para Term Preterm Abortions TAB SAB Ect Mult Living   3 1 0 1 1 0 0 1 0 1      Review of Systems  Gastrointestinal: Positive for nausea.  Neurological: Positive for dizziness.  All other systems reviewed and are negative.     Allergies  Compazine; Reglan; Sulfonamide derivatives; Clarithromycin;  Sulfamethoxazole; Biaxin; Codeine; Hydrocodone; Macrobid; Phenergan; Promethazine; Promethazine hcl; and Sulfa antibiotics  Home Medications   Prior to Admission medications   Medication Sig Start Date End Date Taking? Authorizing Provider  albuterol (PROVENTIL HFA;VENTOLIN HFA) 108 (90 BASE) MCG/ACT inhaler Inhale 2 puffs into the lungs every 6 (six) hours as needed for wheezing or shortness of breath. 12/31/13  Yes Amy Cletis Athens, MD  ALPRAZolam (XANAX) 0.25 MG tablet Take 0.5 mg by mouth daily as needed for anxiety.   Yes Historical Provider, MD  butalbital-acetaminophen-caffeine (FIORICET, ESGIC) 50-325-40 MG per tablet Take 2 tablets by mouth 2 (two) times daily as needed for headache.   Yes Historical Provider, MD  cetirizine (ZYRTEC) 10 MG tablet Take 10 mg  by mouth daily.   Yes Historical Provider, MD  ciprofloxacin (CIPRO) 500 MG tablet Take 1 tablet (500 mg total) by mouth 2 (two) times daily. 08/20/14  Yes Amy Cletis Athens, MD  dexlansoprazole (DEXILANT) 60 MG capsule Take 60 mg by mouth daily.   Yes Historical Provider, MD  HYDROmorphone (DILAUDID) 2 MG tablet Take 2 mg by mouth every 6 (six) hours as needed for severe pain (pain).   Yes Historical Provider, MD  naratriptan (AMERGE) 2.5 MG tablet Take 1 tablet (2.5 mg total) by mouth as needed for migraine. Take one (1) tablet at onset of headache; if returns or does not resolve, may repeat after 4 hours; do not exceed five (5) mg in 24 hours. 06/22/14  Yes Marcial Pacas, MD  omeprazole (PRILOSEC) 20 MG capsule Take 20 mg by mouth 2 (two) times daily as needed (acid reflux).   Yes Historical Provider, MD  ondansetron (ZOFRAN-ODT) 8 MG disintegrating tablet Take 8 mg by mouth daily.  06/28/14  Yes Historical Provider, MD  phenazopyridine (PYRIDIUM) 200 MG tablet Take 1 tablet (200 mg total) by mouth 3 (three) times daily as needed for pain. 08/20/14  Yes Amy Cletis Athens, MD  Prenatal Vit-Fe Fumarate-FA (PRENATAL PO) Take 1 tablet by mouth daily.   Yes Historical Provider, MD  sertraline (ZOLOFT) 50 MG tablet Take 50 mg by mouth daily.   Yes Historical Provider, MD  zolpidem (AMBIEN) 10 MG tablet Take 10 mg by mouth at bedtime as needed for sleep.   Yes Historical Provider, MD   BP 109/72  Pulse 92  Temp(Src) 98.1 F (36.7 C) (Oral)  Resp 19  Ht 5' 4"  (1.626 m)  Wt 148 lb (67.132 kg)  BMI 25.39 kg/m2  SpO2 99%  LMP 08/17/2014 Physical Exam  Nursing note and vitals reviewed. Constitutional: She is oriented to person, place, and time. She appears well-developed and well-nourished. No distress.  HENT:  Head: Normocephalic and atraumatic.  Mouth/Throat: Oropharynx is clear and moist.  Eyes: Conjunctivae and EOM are normal.  Horizontal nystagmus on left lateral gaze.  Neck: Normal range of motion. Neck  supple. No JVD present.  Cardiovascular: Normal rate, regular rhythm, normal heart sounds and intact distal pulses.   No extremity edema.  Pulmonary/Chest: Effort normal and breath sounds normal. No respiratory distress.  Abdominal: Soft. Bowel sounds are normal. There is no tenderness.  Musculoskeletal: Normal range of motion. She exhibits no edema.  Neurological: She is alert and oriented to person, place, and time. She has normal strength. No cranial nerve deficit or sensory deficit. She displays a negative Romberg sign. Coordination normal.  Speech fluent, goal oriented. Moves limbs without ataxia. Equal grip strength bilateral. Dizziness exacerbated with movement and sitting up.  Skin: Skin is warm and  dry. She is not diaphoretic.  Psychiatric: She has a normal mood and affect. Her behavior is normal.    ED Course  Procedures (including critical care time) Labs Review Labs Reviewed  CBC - Abnormal; Notable for the following:    Hemoglobin 11.7 (*)    HCT 35.6 (*)    All other components within normal limits  BASIC METABOLIC PANEL - Abnormal; Notable for the following:    Glucose, Bld 109 (*)    Calcium 8.2 (*)    All other components within normal limits  Randolm Idol, ED    Imaging Review Dg Chest 2 View  08/24/2014   CLINICAL DATA:  Nausea.  Dizziness.  EXAM: CHEST  2 VIEW  COMPARISON:  02/04/2014.  FINDINGS: Port-A-Cath noted. Cardiac monitoring device noted on the left. Mediastinum and hilar structures are normal. Heart size and pulmonary vascularity normal. Lungs clear.  IMPRESSION: No active cardiopulmonary disease.   Electronically Signed   By: Marcello Moores  Register   On: 08/24/2014 08:35     EKG Interpretation None      MDM   Final diagnoses:  Vertigo   Patient with known history of vertigo presenting with an exacerbation of her vertigo. She is nontoxic appearing and in no apparent distress. Afebrile, vital signs stable. Horizontal nystagmus noted on lateral  gaze, otherwise no focal neurologic deficits. Patient will not ambulate since as she feels as if she will fall. Flaps without any acute finding. She was given meclizine and Ativan with no change in her symptoms. Epley maneuver performed with no success. I spoke with Dr. Janann Colonel with [facility] on call for her neurologist Dr. Krista Blue who advised me to give her Valium. He states she was successful with vestibular therapy in the past which he was set up. After receiving Ativan, patient was still unable to walk and states her symptoms remained the same. Attempts to walk patient, she was unsteady on her feet and stumbled onto the exam bed. Prior normal MRI is as stated above. Do not feel MRI is necessary today. Patient admitted for further symptomatic treatment. Admission accepted by Dr. Candiss Norse, [facility].  Case discussed with attending Dr. Colin Rhein who also evaluated patient and agrees with plan of care.   Illene Labrador, PA-C 08/24/14 1551

## 2014-08-24 NOTE — H&P (Signed)
Patient Demographics  [name], is a 38 y.o. female  MRN: [id number]   DOB - August 23, 1976  Admit Date - 08/24/2014  Outpatient Primary MD for the patient is Eliezer Lofts, MD   With History of -  Past Medical History  Diagnosis Date  . Crohn disease 2000    remission  . Asthma     no intubations or ICU stays  . Endometriosis   . Cat allergies   . Dysautonomia     recurrent syncope s/p ILR by Dr Doreatha Lew  . Palpitations   . History of recurrent UTIs   . Chronic nausea     around ovulation time      Past Surgical History  Procedure Laterality Date  . Ileocolectomy    . Loop recorder implant  12/10    by DR Doreatha Lew  . Knee surgery  1990    1988  . Cesarean section      2011, emergency  CS due to placental abruption,2014  . Colon surgery  2002    partial removed due to crohns  . Appendectomy    . Tonsillectomy    . US echocardiography  11/11/2009    EF 55-60%  . [facility]  [phone]    due to Ectopic Pregnancy    in for   Chief Complaint  Patient presents with  . Dizziness     HPI  [name]  is a 38 y.o. female, with H/O vertigo which started 2 years ago and now present on the intermittent basis, Crohn's under remission quiring ileaocolectomy in the past, chronic UTIs, palpitations has a Loop recorder, asthma, recurrent syncope thought to be secondary to autonomic instability, chronic migraine headaches follows with [facility] neurology on a regular basis had an MRI a few weeks ago which was unremarkable. Comes to the ER  with 12 hour history of acute worsening in her vertigo which she describes a spinning sensation, whenever she tries to stand she gets nauseated and throws up, came to the ER, ER M.D. discussed the case with her primary neurologist at [facility] neurology who reviewed her  recent MRI and recommended supportive care with vestibular rehabilitation.  Patient was unable to ambulate in the ER, she had mild generalized headache, no focal weakness, no chest pain palpitations, no fever chills, she is having some intermittent dysuria, had a UA at her PCP office and was told that some blood but no infection. She has history of kidney stone and wants to get a kidney x-ray. I was called to admit the patient for vertigo workup and vascular rehabilitation.    Review of Systems    In addition to the HPI above,   No Fever-chills, No Headache, No changes with Vision or hearing, but positive vertigo No problems swallowing food or Liquids, No Chest pain, Cough or Shortness of Breath, No Abdominal pain, No Nausea or Vommitting, Bowel movements are regular, No Blood in stool or Urine, said she was  told by PCP that her UA had some blood but no infection No dysuria, No new skin rashes or bruises, No new joints pains-aches,  No new weakness, tingling, numbness in any extremity, No recent weight gain or loss, No polyuria, polydypsia or polyphagia, No significant Mental Stressors.  A full 10 point Review of Systems was done, except as stated above, all other Review of Systems were negative.   Social History History  Substance Use Topics  . Smoking status: Never Smoker   . Smokeless tobacco: Never Used  . Alcohol Use: No     Comment: occasionally      Family History Family History  Problem Relation Age of Onset  . Hypertension Mother   . Hyperlipidemia Mother   . Arthritis Mother   . Diabetes Father   . Coronary artery disease Father   . Heart disease Father 78    MI age 20  . Hyperlipidemia Brother   . Hypertension Brother       Prior to Admission medications   Medication Sig Start Date End Date Taking? Authorizing Provider  albuterol (PROVENTIL HFA;VENTOLIN HFA) 108 (90 BASE) MCG/ACT inhaler Inhale 2 puffs into the lungs every 6 (six) hours as needed for  wheezing or shortness of breath. 12/31/13  Yes Amy Cletis Athens, MD  ALPRAZolam (XANAX) 0.25 MG tablet Take 0.5 mg by mouth daily as needed for anxiety.   Yes Historical Provider, MD  butalbital-acetaminophen-caffeine (FIORICET, ESGIC) 50-325-40 MG per tablet Take 2 tablets by mouth 2 (two) times daily as needed for headache.   Yes Historical Provider, MD  cetirizine (ZYRTEC) 10 MG tablet Take 10 mg by mouth daily.   Yes Historical Provider, MD  ciprofloxacin (CIPRO) 500 MG tablet Take 1 tablet (500 mg total) by mouth 2 (two) times daily. 08/20/14  Yes Amy Cletis Athens, MD  dexlansoprazole (DEXILANT) 60 MG capsule Take 60 mg by mouth daily.   Yes Historical Provider, MD  HYDROmorphone (DILAUDID) 2 MG tablet Take 2 mg by mouth every 6 (six) hours as needed for severe pain (pain).   Yes Historical Provider, MD  naratriptan (AMERGE) 2.5 MG tablet Take 1 tablet (2.5 mg total) by mouth as needed for migraine. Take one (1) tablet at onset of headache; if returns or does not resolve, may repeat after 4 hours; do not exceed five (5) mg in 24 hours. 06/22/14  Yes Marcial Pacas, MD  omeprazole (PRILOSEC) 20 MG capsule Take 20 mg by mouth 2 (two) times daily as needed (acid reflux).   Yes Historical Provider, MD  ondansetron (ZOFRAN-ODT) 8 MG disintegrating tablet Take 8 mg by mouth daily.  06/28/14  Yes Historical Provider, MD  phenazopyridine (PYRIDIUM) 200 MG tablet Take 1 tablet (200 mg total) by mouth 3 (three) times daily as needed for pain. 08/20/14  Yes Amy Cletis Athens, MD  Prenatal Vit-Fe Fumarate-FA (PRENATAL PO) Take 1 tablet by mouth daily.   Yes Historical Provider, MD  sertraline (ZOLOFT) 50 MG tablet Take 50 mg by mouth daily.   Yes Historical Provider, MD  zolpidem (AMBIEN) 10 MG tablet Take 10 mg by mouth at bedtime as needed for sleep.   Yes Historical Provider, MD    Allergies  Allergen Reactions  . Compazine [Prochlorperazine Edisylate] Anaphylaxis  . Reglan [Metoclopramide] Anaphylaxis and Other (See  Comments)    Other reaction(s): GI Upset (intolerance) Intensifies her Chron's  . Sulfonamide Derivatives Other (See Comments)    Hematemesis; documented while a young child  . Clarithromycin Other (See Comments)  Reacts with chron's disease  . Sulfamethoxazole Rash and Other (See Comments)    Internal bleeding and kidney infection  . Biaxin [Clarithromycin] Other (See Comments)    Cant take due to Chrons disease  . Codeine Nausea And Vomiting and Other (See Comments)    Dilaudid and demerol OK  . Hydrocodone Nausea And Vomiting  . Macrobid [Nitrofurantoin] Nausea And Vomiting  . Phenergan [Promethazine Hcl] Other (See Comments)    "muscle twitches"  . Promethazine Rash and Other (See Comments)    Muscular seizures  . Promethazine Hcl Other (See Comments)    Muscular seizures  . Sulfa Antibiotics Hives and Other (See Comments)    Hematemesis; documented while a young child    Physical Exam  Vitals  Blood pressure 109/72, pulse 92, temperature 98.1 F (36.7 C), temperature source Oral, resp. rate 19, height 5' 4"  (1.626 m), weight 67.132 kg (148 lb), last menstrual period 08/17/2014, SpO2 99.00%.   1. General middle aged white female lying in bed in NAD,    2. Normal affect and insight, Not Suicidal or Homicidal, Awake Alert, Oriented X 3.  3. No F.N deficits, ALL C.Nerves Intact, Strength 5/5 all 4 extremities, Sensation intact all 4 extremities, Plantars down going.  4. Ears and Eyes appear Normal, Conjunctivae clear, PERRLA. Moist Oral Mucosa. Mild horizontal nystagmus on right lateral gaze.  5. Supple Neck, No JVD, No cervical lymphadenopathy appriciated, No Carotid Bruits.  6. Symmetrical Chest wall movement, Good air movement bilaterally, CTAB.  7. RRR, No Gallops, Rubs or Murmurs, No Parasternal Heave.  8. Positive Bowel Sounds, Abdomen Soft, No tenderness, No organomegaly appriciated,No rebound -guarding or rigidity.  9.  No Cyanosis, Normal Skin Turgor, No  Skin Rash or Bruise.  10. Good muscle tone,  joints appear normal , no effusions, Normal ROM.  11. No Palpable Lymph Nodes in Neck or Axillae     Data Review  CBC  Recent Labs Lab 08/24/14 0821  WBC 6.7  HGB 11.7*  HCT 35.6*  PLT 273  MCV 85.2  MCH 28.0  MCHC 32.9  RDW 13.6   ------------------------------------------------------------------------------------------------------------------  Chemistries   Recent Labs Lab 08/24/14 0821  NA 138  K 4.0  CL 105  CO2 21  GLUCOSE 109*  BUN 13  CREATININE 0.61  CALCIUM 8.2*   ------------------------------------------------------------------------------------------------------------------ estimated creatinine clearance is 89.9 ml/min (by C-G formula based on Cr of 0.61). ------------------------------------------------------------------------------------------------------------------ No results found for this basename: TSH, T4TOTAL, FREET3, T3FREE, THYROIDAB,  in the last 72 hours   Coagulation profile No results found for this basename: INR, PROTIME,  in the last 168 hours ------------------------------------------------------------------------------------------------------------------- No results found for this basename: DDIMER,  in the last 72 hours -------------------------------------------------------------------------------------------------------------------  Cardiac Enzymes No results found for this basename: CK, CKMB, TROPONINI, MYOGLOBIN,  in the last 168 hours ------------------------------------------------------------------------------------------------------------------ No components found with this basename: POCBNP,    ---------------------------------------------------------------------------------------------------------------  Urinalysis    Component Value Date/Time   COLORURINE YELLOW 11/26/2013 2256   APPEARANCEUR CLOUDY* 11/26/2013 2256   LABSPEC 1.023 11/26/2013 2256   PHURINE 5.0 11/26/2013  2256   GLUCOSEU NEGATIVE 11/26/2013 2256   HGBUR NEGATIVE 11/26/2013 2256   BILIRUBINUR negative 08/20/2014 1626   BILIRUBINUR NEGATIVE 11/26/2013 2256   KETONESUR NEGATIVE 11/26/2013 2256   PROTEINUR trace 08/20/2014 1626   PROTEINUR NEGATIVE 11/26/2013 2256   UROBILINOGEN 0.2 08/20/2014 1626   UROBILINOGEN 0.2 11/26/2013 2256   NITRITE negative 08/20/2014 1626   NITRITE NEGATIVE 11/26/2013 2256   LEUKOCYTESUR Negative 08/20/2014 1626    ----------------------------------------------------------------------------------------------------------------  Imaging results:   Dg Chest 2 View  08/24/2014   CLINICAL DATA:  Nausea.  Dizziness.  EXAM: CHEST  2 VIEW  COMPARISON:  02/04/2014.  FINDINGS: Port-A-Cath noted. Cardiac monitoring device noted on the left. Mediastinum and hilar structures are normal. Heart size and pulmonary vascularity normal. Lungs clear.  IMPRESSION: No active cardiopulmonary disease.   Electronically Signed   By: Marcello Moores  Register   On: 08/24/2014 08:35    My personal review of EKG: Rhythm NSR, Rate  88/min,  no Acute ST changes    Assessment & Plan   1.Vertigo - had a similar episode 2 years ago and then on intermittent basis, this sounds like benign positional vertigo. She will be admitted for 23 hour Obs, will place her on scheduled ativert, as needed IV Ativan, Zofran for nausea, PT OT to evaluate treat for vestibular rehabilitation. Last admission she required outpatient vestibular rehabilitation post discharge.    2. History of recurrent UTIs. Repeat UA with culture sensitivity which qualifies, patient requests kidney x-ray to rule out a stone it has been ordered.    3. History of Crohn's disease. Currently under remission outpatient followup with GI. She follows with [facility].    4. GERD. PPI continue.    5. Depression anxiety. Continue home medications.    6. Low potassium. Replace IV and recheck in the morning.    7. History of palpitations.  Telemetry monitor, has a loop recorder. Since QTC was slightly on the higher side we'll give her a gram of IV magnesium and repeat magnesium levels in the morning.    8. History of migraine headaches. Home dose Imitrex continue.    9. Breast-feeding - has Chronic pain. She noticed to be on Dilaudid orally at home, she will be getting Antivert and benzodiazepines for vertigo. He should and has been strictly counseled to pump and discard breast milk till she is off of all the medications given in the hospital for at least 2 days. And to check with OB before commencing breast-feeding again. They've been warned that these medications could have severe sedative effects on the baby and can even cause death.      DVT Prophylaxis  SCDs   AM Labs Ordered, also please review Full Orders  Family Communication: Admission, patients condition and plan of care including tests being ordered have been discussed with the patient and husband who indicate understanding and agree with the plan and Code Status.  Code Status Full Likely DC to  Home  Condition Fair  Time spent in minutes : 35    SINGH,PRASHANT K M.D on 08/24/2014 at 1:10 PM  Between 7am to 7pm - Pager - [phone]  After 7pm go to [email or web address] - password [email or web address]  And look for the night coverage person covering me after hours  [facility] Office  [phone]   **Disclaimer: This note may have been dictated with voice recognition software. Similar sounding words can inadvertently be transcribed and this note may contain transcription errors which may not have been corrected upon publication of note.**

## 2014-08-24 NOTE — ED Notes (Signed)
pa at bedside.

## 2014-08-24 NOTE — Telephone Encounter (Signed)
Received call from ED PA, Jim Like, in regards to patient [name]. Patient currently in the ED with signs/symptoms consistent with a peripheral positional vertigo. Tried meclizine with no benefit. Per ED the exam is benign. Has had brain imaging in the past. Can try low dose valium for symptomatic relief and will refer to neuro-rehab for vestibular rehab.

## 2014-08-24 NOTE — ED Notes (Signed)
Pt attempted to stand. Pt unable to tolerate sitting on the side of bed. Pt reported increased dizziness and nausea.  Pt HR increased to 117. Pt HR once she returned to lying HR decreased to 90. Pt noted to have nystagmus while sitting as well.

## 2014-08-25 LAB — CBC
HCT: 34.9 % — ABNORMAL LOW (ref 36.0–46.0)
Hemoglobin: 11.4 g/dL — ABNORMAL LOW (ref 12.0–15.0)
MCH: 28.5 pg (ref 26.0–34.0)
MCHC: 32.7 g/dL (ref 30.0–36.0)
MCV: 87.3 fL (ref 78.0–100.0)
Platelets: 259 10*3/uL (ref 150–400)
RBC: 4 MIL/uL (ref 3.87–5.11)
RDW: 13.9 % (ref 11.5–15.5)
WBC: 6.5 10*3/uL (ref 4.0–10.5)

## 2014-08-25 LAB — BASIC METABOLIC PANEL
ANION GAP: 7 (ref 5–15)
BUN: 8 mg/dL (ref 6–23)
CALCIUM: 7.5 mg/dL — AB (ref 8.4–10.5)
CO2: 22 mEq/L (ref 19–32)
CREATININE: 0.57 mg/dL (ref 0.50–1.10)
Chloride: 113 mEq/L — ABNORMAL HIGH (ref 96–112)
GFR calc non Af Amer: >90 mL/min (ref >90)
Glucose, Bld: 104 mg/dL — ABNORMAL HIGH (ref 70–99)
Potassium: 4.7 mEq/L (ref 3.7–5.3)
Sodium: 142 mEq/L (ref 137–147)

## 2014-08-25 MED ORDER — HEPARIN SOD (PORK) LOCK FLUSH 100 UNIT/ML IV SOLN
500.0000 [IU] | INTRAVENOUS | Status: AC | PRN
  Administered 2014-08-25: 500 [IU]

## 2014-08-25 MED ORDER — SODIUM CHLORIDE 0.9 % IJ SOLN: 10.0000 mL | INTRAMUSCULAR | Status: DC | PRN

## 2014-08-25 MED ORDER — MECLIZINE HCL 50 MG PO TABS: 50.0000 mg | ORAL_TABLET | Freq: Three times a day (TID) | ORAL | Status: DC

## 2014-08-25 NOTE — Progress Notes (Signed)
Physical Therapy Treatment Patient Details Name: QUEEN ABBETT MRN: [id number] DOB: Apr 04, 1976 Today's Date: 08/25/2014    History of Present Illness 38 y.o. female admitted to [facility] on 08/24/14 with 2 year h/o of dizziness that has been bothering her on an intermittent basis.  MRI negative for acute infarct, and dizziness felt to be positional in nature by referring MD.  Pt with other significant PMHx of Crohn's disease, anxiety, HA, knee surgery, and loop recorder implant.    PT Comments    Pt given HEP for semont maneuver (for L posterior canal cupulolithiasis) and x1 gaze stability exercises (both vertical and horizontal).  Pt asked if I could return later and review the information with her husband and how to help her do the maneuver (she reports he helped her last time, and I did not want her to do the semont without someone helping her at home for fear she would fall off the bed if she got dizzy.)  PT gave pager number to RN to inform me of when the husband arrives.   Follow Up Recommendations  Outpatient PT;Supervision/Assistance - 24 hour (Vestibular rehab at the neurorehab center)     Equipment Recommendations  None recommended by PT    Recommendations for Other Services   NA     Precautions / Restrictions Precautions Precautions: Fall Precaution Comments: pt is very unsteady on her feet.           Cognition Arousal/Alertness: Awake/alert Behavior During Therapy: WFL for tasks assessed/performed Overall Cognitive Status: Within Functional Limits for tasks assessed                         General Comments General comments (skin integrity, edema, etc.): Further oculomotor testing preformed with normal smooth pursuits, normal saccades, (+) VOR horizontal and vertical head shaking with horizontal more symptomatic.  Handouts given for semont maneuver and x1 exercises both vertical and horizontal.  Encouraged meclazine use at home for symptom management initially, but not  to take it before going to her OP PT sessions.        Pertinent Vitals/Pain Pain Assessment: No/denies pain           PT Goals (current goals can now be found in the care plan section) Acute Rehab PT Goals Patient Stated Goal: to fix this and get back to work ASAP.  Progress towards PT goals: Progressing toward goals    Frequency  Min 4X/week    PT Plan Current plan remains appropriate       End of Session   Activity Tolerance: Other (comment) (limited by dizziness) Patient left: in chair;with call bell/phone within reach     Time: 1223-1246 PT Time Calculation (min): 23 min  Charges:  $Self Care/Home Management: 23-37            Tiphani Mells B. [place], [place], DPT [phone]   08/25/2014, 4:11 PM

## 2014-08-25 NOTE — Evaluation (Signed)
Physical Therapy Evaluation Patient Details Name: [name] MRN: [id number] DOB: 1976/10/06 Today's Date: 08/25/2014   History of Present Illness  38 y.o. female admitted to [facility] on 08/24/14 with 2 year h/o of dizziness that has been bothering her on an intermittent basis.  MRI negative for acute infarct, and dizziness felt to be positional in nature by referring MD.  Pt with other significant PMHx of Crohn's disease, anxiety, HA, knee surgery, and loop recorder implant.  Clinical Impression  Pt presents with L posterior canal cupulolithiasis, which is a form of peripheral vertigo.  Treatments completed x 3, however, vertigo persists.  Pt has had this in the past and it has taken weeks to recover with treatment by the vestibular specialists at OP Neuro rehab.  She will likely have a long course of treatment this time as well as her baseline seems to indicate underlying motion sensativity/gaze stability issues at her best.  I recommend she have 24/7 physical assist for her mobility as she is very unsteady on her feet.   PT to follow acutely for deficits listed below.       Follow Up Recommendations Outpatient PT;Supervision/Assistance - 24 hour (24/7 assist, OP PT for vestibular rehab at [facility] center)    Equipment Recommendations  None recommended by PT    Recommendations for Other Services   NA    Precautions / Restrictions Precautions Precautions: Fall Precaution Comments: pt is very unsteady on her feet.       Mobility  Bed Mobility Overal bed mobility: Independent                Transfers Overall transfer level: Needs assistance   Transfers: Sit to/from Stand;Stand Pivot Transfers Sit to Stand: Min assist Stand pivot transfers: Min assist       General transfer comment: Min assist to support trunk for balance while standing and pivoting to the recliner chair.  Used compensation techniques to complete the task (turn eyes, turn head, turn body).    Ambulation/Gait             General Gait Details: NT as mobility was preformed after vestibular assessment and she was very dizzy.          Balance Overall balance assessment: Needs assistance Sitting-balance support: Feet supported;No upper extremity supported;Bilateral upper extremity supported Sitting balance-Leahy Scale: Good Sitting balance - Comments: Pt with (+) sway in sitting when dizzy.    Standing balance support: Single extremity supported Standing balance-Leahy Scale: Poor Standing balance comment: needs external assist in standing.  Better when target finding.                              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  Husband is a principal at another school. Pt reports at baseline she does not drive on the highway.      Hand Dominance        Extremity/Trunk Assessment   Upper Extremity Assessment: Overall WFL for tasks assessed  Lower Extremity Assessment: Overall WFL for tasks assessed      Cervical / Trunk Assessment: Normal  Communication   Communication: No difficulties  Cognition Arousal/Alertness: Awake/alert Behavior During Therapy: WFL for tasks assessed/performed Overall Cognitive Status: Within Functional Limits for tasks assessed                      General Comments General comments (skin integrity, edema, etc.): Vestibular assessment completed.  Pt reports h/o vestibular issues which were treated at OP Neuro reahb in the past (she reports it took them 5 weeks to get it under control).  She  reports she still has some motion sensativity that prevents her from driving on the highway.  She does have a h/o migraines.  She wears glasses for distance, but really only uses them to see the TV guide on the TV.  Pt is asymptomatic when she is lying still in the bed, most symptomatic with supine to sit and sit to stand.  Log roll test bil (-) on the right, midly (+) on the left with one to two beats of left upwardly rotating nystagmus).  Dix Hallpike mildly positive on the left for symptoms of "movement" and increased blurry vision.  Marye Round (-) on the right for symptoms.  PT preformed Epley's on the left x 1 with prolonged symptoms with nose to the ground position (much longer than is typical for straight forward BPPV).  Presumed left posterior canal cupulolithiasis, so higher velocity semont maneuver preformed x 2.  Pt without resolution of symptoms and asked to sit upright in the recliner chair after treatments.   Pt reports near constant nystagmus in sitting in the recliner.  Encouraged to target find and try to let it settle.  PT to check back in an hour or so for home exercises.            Assessment/Plan    PT Assessment Patient needs continued PT services  PT Diagnosis Difficulty walking;Abnormality of gait   PT Problem List Decreased activity tolerance;Decreased balance;Decreased mobility  PT Treatment Interventions DME instruction;Gait training;Stair training;Functional mobility training;Therapeutic activities;Therapeutic exercise;Balance training;Neuromuscular re-education;Patient/family education   PT Goals (Current goals can be found in the Care Plan section) Acute Rehab PT Goals Patient Stated Goal: to fix the issue PT Goal Formulation: With patient Time For Goal Achievement: 09/08/14 Potential to Achieve Goals: Good    Frequency Min 4X/week    End of Session   Activity Tolerance: Other (comment) (limited by dizziness) Patient left: in chair;with call bell/phone within  reach;Other (comment) (with RN student in the room) Nurse Communication: Mobility status    Functional Assessment Tool Used: assist level Functional Limitation: Mobility: Walking and moving around Mobility: Walking and Moving Around Current Status ([id number]): At least 1 percent but less than 20 percent impaired, limited or restricted Mobility: Walking and Moving Around Goal Status [id number]): 0 percent impaired, limited or restricted    Time: 1006-1039 PT Time Calculation (min): 33 min   Charges:   PT Evaluation $Initial PT Evaluation Tier I: 1 Procedure PT Treatments $Therapeutic Activity: 8-22 mins   PT G Codes:   Functional Assessment Tool Used: assist level Functional Limitation: Mobility: Walking and moving around    [place] B. Shaquanta Harkless, PT, DPT [phone]    08/25/2014, 11:08 AM

## 2014-08-25 NOTE — Progress Notes (Signed)
Physical Therapy Treatment Patient Details Name: [name] MRN: [id number] DOB: 1975/12/31 Today's Date: 08/25/2014    History of Present Illness 38 y.o. female admitted to [facility] on 08/24/14 with 2 year h/o of dizziness that has been bothering her on an intermittent basis.  MRI negative for acute infarct, and dizziness felt to be positional in nature by referring MD.  Pt with other significant PMHx of Crohn's disease, anxiety, HA, knee surgery, and loop recorder implant.    PT Comments    Pt continues to be very symptomatic, but was proud to report that she made it to the bathroom and back with only supervision from her husband (she was holding to every piece of furniture on the way).  Pt continues to not be able to turn her head as this increases her dizziness and nausea.  I am now thinking she may be more of a left sided hypofunction, but further testing at OP needs to be done.  Reviewed prescribed exercises and how to help with husband as well as precautions (if you get much worse, stop doing all exercises and get into therapist for further assessment- explained the risk of moving the crystals into the horizontal canal).  Pt and husband given the resources for f/u.    Follow Up Recommendations  Outpatient PT;Supervision/Assistance - 24 hour (Neurorehab for vestibular assessment)     Equipment Recommendations  None recommended by PT    Recommendations for Other Services    NA     Precautions / Restrictions Precautions Precautions: Fall Precaution Comments: pt is very unsteady on her feet.           Cognition Arousal/Alertness: Awake/alert Behavior During Therapy: WFL for tasks assessed/performed Overall Cognitive Status: Within Functional Limits for tasks assessed                         General Comments General comments (skin integrity, edema, etc.): I came back to educate husband on how to help pt preform the prescribed maneuver.  Pt was able to demonstrate.  We also  discussed OP PT and head shaking exercises again.  More questions asked about her h/o vertigo with the recommendation that since this seems to be chronic in nature with a baseline motion sensativity that she should go see an ENT who specializes in vestibular testing.  As peripherial as this initially looked, I am thinking she may have more of a peripherial left hypofunction as she has not truely reponded to the semont maneuver and the tests were only mildly positive before.  I did do the roll test bil to make sure I did not dump any crystals into the horizontal canal with the previous treatment that day and I did not.        Pertinent Vitals/Pain Pain Assessment: No/denies pain           PT Goals (current goals can now be found in the care plan section) Acute Rehab PT Goals Patient Stated Goal: to fix this and get back to work ASAP.  Progress towards PT goals: Progressing toward goals    Frequency  Min 4X/week    PT Plan Current plan remains appropriate       End of Session   Activity Tolerance: Other (comment) (limited by nausea and vertigo) Patient left: in bed;with call bell/phone within reach;with family/visitor present     Time: 8182 (did not charge for ~20 mins- just friendly chatting)-1721 PT Time Calculation (min): 40 min  Charges:  $Self Care/Home Management: 8-22                    G Codes:  Functional Assessment Tool Used: assist level Functional Limitation: Mobility: Walking and moving around Mobility: Walking and Moving Around Goal Status [phone]): At least 1 percent but less than 20 percent impaired, limited or restricted Mobility: Walking and Moving Around Discharge Status [phone]): At least 1 percent but less than 20 percent impaired, limited or restricted   Electa Sterry B. Omar, Aline, DPT [phone]   08/25/2014, 6:06 PM

## 2014-08-25 NOTE — Progress Notes (Signed)
UR Completed.  Vergie Living [phone] 08/25/2014

## 2014-08-25 NOTE — Discharge Instructions (Signed)
Vertigo Vertigo means you feel like you are moving when you are not. Vertigo can make you feel like things around you are moving when they are not. This problem often goes away on its own.  HOME CARE   Follow your doctor's instructions.  Avoid driving.  Avoid using heavy machinery.  Avoid doing any activity that could be dangerous if you have a vertigo attack.  Tell your doctor if a medicine seems to cause your vertigo. GET HELP RIGHT AWAY IF:   Your medicines do not help or make you feel worse.  You have trouble talking or walking.  You feel weak or have trouble using your arms, hands, or legs.  You have bad headaches.  You keep feeling sick to your stomach (nauseous) or throwing up (vomiting).  Your vision changes.  A family member notices changes in your behavior.  Your problems get worse. MAKE SURE YOU:  Understand these instructions.  Will watch your condition.  Will get help right away if you are not doing well or get worse. Document Released: 09/18/2008 Document Revised: 03/03/2012 Document Reviewed: 06/28/2011 ExitCare Patient Information 2015 [employer]. This information is not intended to replace advice given to you by your health care provider. Make sure you discuss any questions you have with your health care provider.  

## 2014-08-25 NOTE — Care Management (Signed)
[place], RN,BSN [phone] CM did make Epic Referral for outpatient PT services for Vestibular therapy @ the [facility] [address]. Rehab to call pt with a time to visit for therapy. No further needs from CM at this time. Bethena Roys, RN,BSN [phone]

## 2014-08-25 NOTE — Discharge Summary (Addendum)
Physician Discharge Summary  [name] HYW:[id number] DOB: January 07, 1976 DOA: 08/24/2014  PCP: Eliezer Lofts, MD  Admit date: 08/24/2014 Discharge date: 08/25/2014  Time spent: 35 minutes  Recommendations for Outpatient Follow-up:  1. Follow up with Neurologist  Discharge Diagnoses:  Principal Problem:   Vertigo Active Problems:   Crohn's disease   Asthma, moderate persistent   GERD (gastroesophageal reflux disease)   Endometriosis   Migraine   Hypokalemia   Discharge Condition: stable  Diet recommendation: heart healthy  Filed Weights   08/24/14 0742 08/24/14 1415 08/25/14 0557  Weight: 67.132 kg (148 lb) 67.132 kg (148 lb) 67 kg (147 lb 11.3 oz)    History of present illness:  38 y.o. female, with H/O vertigo which started 2 years ago and now present on the intermittent basis, Crohn's under remission quiring ileaocolectomy in the past, chronic UTIs,Came to the ER with 12 hour history of acute worsening in her vertigo which she describes a spinning sensation, whenever she tries to stand she gets nauseated and throws up, came to the ER, ER M.D. discussed the case with her primary neurologist at [facility] neurology who reviewed her recent MRI and recommended supportive care with vestibular rehabilitation.   Hospital Course:  Benign Positional Vertigo: - had a similar episode 2 years ago and then on intermittent basis, this sounds like benign positional vertigo.  - Admitted for 23 hour Obs, placed on scheduled ativert, as needed IV Ativan, Zofran for nausea,  - OT to evaluate treat for vestibular rehabilitation. L  History of Crohn's disease: - Currently under remission outpatient followup with GI. She follows with [facility].   GERD. PPI continue.      Procedures:  Abd: xray no stone  Consultations:  none  Discharge Exam: Filed Vitals:   08/25/14 0557  BP: 105/55  Pulse: 77  Temp: 98.2 F (36.8 C)  Resp: 18    General: A&O x3 Cardiovascular: RRR Respiratory:  good air movement CTA B/L  Discharge Instructions You were cared for by a hospitalist during your hospital stay. If you have any questions about your discharge medications or the care you received while you were in the hospital after you are discharged, you can call the unit and asked to speak with the hospitalist on call if the hospitalist that took care of you is not available. Once you are discharged, your primary care physician will handle any further medical issues. Please note that NO REFILLS for any discharge medications will be authorized once you are discharged, as it is imperative that you return to your primary care physician (or establish a relationship with a primary care physician if you do not have one) for your aftercare needs so that they can reassess your need for medications and monitor your lab values.  Discharge Instructions   Diet - low sodium heart healthy    Complete by:  As directed      Increase activity slowly    Complete by:  As directed             Medication List    STOP taking these medications       ciprofloxacin 500 MG tablet  Commonly known as:  CIPRO      TAKE these medications       albuterol 108 (90 BASE) MCG/ACT inhaler  Commonly known as:  PROVENTIL HFA;VENTOLIN HFA  Inhale 2 puffs into the lungs every 6 (six) hours as needed for wheezing or shortness of breath.     ALPRAZolam  0.25 MG tablet  Commonly known as:  XANAX  Take 0.5 mg by mouth daily as needed for anxiety.     butalbital-acetaminophen-caffeine 50-325-40 MG per tablet  Commonly known as:  FIORICET, ESGIC  Take 2 tablets by mouth 2 (two) times daily as needed for headache.     cetirizine 10 MG tablet  Commonly known as:  ZYRTEC  Take 10 mg by mouth daily.     dexlansoprazole 60 MG capsule  Commonly known as:  DEXILANT  Take 60 mg by mouth daily.     HYDROmorphone 2 MG tablet  Commonly known as:  DILAUDID  Take 2 mg by mouth every 6 (six) hours as needed for severe pain  (pain).     naratriptan 2.5 MG tablet  Commonly known as:  AMERGE  Take 1 tablet (2.5 mg total) by mouth as needed for migraine. Take one (1) tablet at onset of headache; if returns or does not resolve, may repeat after 4 hours; do not exceed five (5) mg in 24 hours.     omeprazole 20 MG capsule  Commonly known as:  PRILOSEC  Take 20 mg by mouth 2 (two) times daily as needed (acid reflux).     ondansetron 8 MG disintegrating tablet  Commonly known as:  ZOFRAN-ODT  Take 8 mg by mouth daily.     phenazopyridine 200 MG tablet  Commonly known as:  PYRIDIUM  Take 1 tablet (200 mg total) by mouth 3 (three) times daily as needed for pain.     PRENATAL PO  Take 1 tablet by mouth daily.     sertraline 50 MG tablet  Commonly known as:  ZOLOFT  Take 50 mg by mouth daily.     zolpidem 10 MG tablet  Commonly known as:  AMBIEN  Take 10 mg by mouth at bedtime as needed for sleep.       Allergies  Allergen Reactions  . Compazine [Prochlorperazine Edisylate] Anaphylaxis  . Reglan [Metoclopramide] Anaphylaxis and Other (See Comments)    Other reaction(s): GI Upset (intolerance) Intensifies her Chron's  . Sulfonamide Derivatives Other (See Comments)    Hematemesis; documented while a young child  . Clarithromycin Other (See Comments)    Reacts with chron's disease  . Sulfamethoxazole Rash and Other (See Comments)    Internal bleeding and kidney infection  . Biaxin [Clarithromycin] Other (See Comments)    Cant take due to Chrons disease  . Codeine Nausea And Vomiting and Other (See Comments)    Dilaudid and demerol OK  . Hydrocodone Nausea And Vomiting  . Macrobid [Nitrofurantoin] Nausea And Vomiting  . Phenergan [Promethazine Hcl] Other (See Comments)    "muscle twitches"  . Promethazine Rash and Other (See Comments)    Muscular seizures  . Promethazine Hcl Other (See Comments)    Muscular seizures  . Sulfa Antibiotics Hives and Other (See Comments)    Hematemesis; documented  while a young child       Follow-up Information   Follow up with [facility] In 3 weeks. (hospital follow up)    Contact information:   [address] [place] [address] [phone]       The results of significant diagnostics from this hospitalization (including imaging, microbiology, ancillary and laboratory) are listed below for reference.    Significant Diagnostic Studies: Dg Chest 2 View  08/24/2014   CLINICAL DATA:  Nausea.  Dizziness.  EXAM: CHEST  2 VIEW  COMPARISON:  02/04/2014.  FINDINGS: Port-A-Cath noted.  Cardiac monitoring device noted on the left. Mediastinum and hilar structures are normal. Heart size and pulmonary vascularity normal. Lungs clear.  IMPRESSION: No active cardiopulmonary disease.   Electronically Signed   By: Marcello Moores  Register   On: 08/24/2014 08:35   Dg Abd 2 Views  08/24/2014   CLINICAL DATA:  Evaluate for kidney stone.  EXAM: ABDOMEN - 2 VIEW  COMPARISON:  Abdominal CT 10/07/2012  FINDINGS: There is no evidence of urolithiasis. A small calcification in the right hemipelvis correlates with phlebolith on 2013 abdominal CT. Bowel sutures noted over the right flank. No bowel obstruction or perforation. Lung bases are clear.  IMPRESSION: Negative.   Electronically Signed   By: Jorje Guild M.D.   On: 08/24/2014 13:42    Microbiology: Recent Results (from the past 240 hour(s))  URINE CULTURE     Status: None   Collection Time    08/20/14  5:18 PM      Result Value Ref Range Status   Colony Count NO GROWTH   Final   Organism ID, Bacteria NO GROWTH   Final     Labs: Basic Metabolic Panel:  Recent Labs Lab 08/24/14 0821 08/25/14 0530  NA 138 142  K 4.0 4.7  CL 105 113*  CO2 21 22  GLUCOSE 109* 104*  BUN 13 8  CREATININE 0.61 0.57  CALCIUM 8.2* 7.5*   Liver Function Tests: No results found for this basename: AST, ALT, ALKPHOS, BILITOT, PROT, ALBUMIN,  in the last 168 hours No results found for this basename:  LIPASE, AMYLASE,  in the last 168 hours No results found for this basename: AMMONIA,  in the last 168 hours CBC:  Recent Labs Lab 08/24/14 0821 08/25/14 0530  WBC 6.7 6.5  HGB 11.7* 11.4*  HCT 35.6* 34.9*  MCV 85.2 87.3  PLT 273 259   Cardiac Enzymes: No results found for this basename: CKTOTAL, CKMB, CKMBINDEX, TROPONINI,  in the last 168 hours BNP: BNP (last 3 results) No results found for this basename: PROBNP,  in the last 8760 hours CBG: No results found for this basename: GLUCAP,  in the last 168 hours     Signed:  Charlynne Cousins  [facility] 08/25/2014, 7:45 AM

## 2014-08-26 LAB — URINE CULTURE: Colony Count: >=100000

## 2014-08-27 ENCOUNTER — Telehealth: Payer: Self-pay | Admitting: *Deleted

## 2014-08-27 ENCOUNTER — Ambulatory Visit: Payer: BC Managed Care – PPO | Attending: Neurology | Admitting: Physical Therapy

## 2014-08-27 DIAGNOSIS — R42 Dizziness and giddiness: Secondary | ICD-10-CM | POA: Diagnosis not present

## 2014-08-27 DIAGNOSIS — IMO0001 Reserved for inherently not codable concepts without codable children: Secondary | ICD-10-CM | POA: Diagnosis not present

## 2014-08-27 MED ORDER — CEPHALEXIN 500 MG PO CAPS
500.0000 mg | ORAL_CAPSULE | Freq: Four times a day (QID) | ORAL | Status: DC
Start: 2014-08-27 — End: 2014-09-22

## 2014-08-27 NOTE — Telephone Encounter (Signed)
Message copied by Carter Kitten on Fri Aug 27, 2014  8:26 AM ------      Message from: [place], [place] E      Created: Fri Aug 27, 2014 12:11 AM       Notify pt that her urine culture came back positive for [facility]... It is resistant to cipro. If she is not on any other antibiotics we need to start one to cover this. It is also resistant to several other antibotics.. Please call in  keflex 500 mg four times a day for 10days #40 0 refills ------

## 2014-08-27 NOTE — Telephone Encounter (Signed)
[place] notified as instructed by telephone.  Keflex prescription sent in to [facility].

## 2014-08-27 NOTE — ED Provider Notes (Signed)
Medical screening examination/treatment/procedure(s) were conducted as a shared visit with non-physician practitioner(s) and myself.  I personally evaluated the patient during the encounter.   EKG Interpretation   Date/Time:  Tuesday August 24 2014 07:44:47 EDT Ventricular Rate:  88 PR Interval:  148 QRS Duration: 84 QT Interval:  413 QTC Calculation: 500 R Axis:   80 Text Interpretation:  Sinus rhythm Borderline prolonged QT interval ED  PHYSICIAN INTERPRETATION AVAILABLE IN [facility] Confirmed by TEST,  Record ([id number]) on 08/26/2014 7:06:09 AM      Date: 08/27/2014  Rate: 88  Rhythm: normal sinus rhythm  QRS Axis: normal  Intervals: normal  ST/T Wave abnormalities: normal  Conduction Disutrbances: none  Narrative Interpretation: unremarkable      I performed an examination on the patient including cardiac, pulmonary, and gi systems which were unremarkable.  I attempted to ambulate the pt with recurrent vertigo and she was unable to walk unassisted despite recent attempts.  Her neuro exam was suggestive of peripheral vertigo without focal neuro deficits.  Admitted in stable condition.    Debby Freiberg, MD 08/27/14 [phone]

## 2014-09-02 ENCOUNTER — Ambulatory Visit: Payer: BC Managed Care – PPO | Admitting: Family Medicine

## 2014-09-02 ENCOUNTER — Ambulatory Visit: Payer: BC Managed Care – PPO | Admitting: Physical Therapy

## 2014-09-02 DIAGNOSIS — IMO0001 Reserved for inherently not codable concepts without codable children: Secondary | ICD-10-CM | POA: Diagnosis not present

## 2014-09-06 ENCOUNTER — Telehealth: Payer: Self-pay | Admitting: Neurology

## 2014-09-06 NOTE — Telephone Encounter (Signed)
Faxed initial summary for physical therapy services to [facility] on 09/06/14.

## 2014-09-09 ENCOUNTER — Ambulatory Visit: Payer: BC Managed Care – PPO

## 2014-09-09 ENCOUNTER — Ambulatory Visit: Payer: BC Managed Care – PPO | Admitting: Physical Therapy

## 2014-09-09 ENCOUNTER — Other Ambulatory Visit: Payer: Self-pay | Admitting: Family Medicine

## 2014-09-09 DIAGNOSIS — IMO0001 Reserved for inherently not codable concepts without codable children: Secondary | ICD-10-CM | POA: Diagnosis not present

## 2014-09-15 ENCOUNTER — Other Ambulatory Visit: Payer: Self-pay | Admitting: Family Medicine

## 2014-09-16 ENCOUNTER — Encounter: Payer: BC Managed Care – PPO | Admitting: Physical Therapy

## 2014-09-16 NOTE — Telephone Encounter (Signed)
Called to [facility].

## 2014-09-16 NOTE — Telephone Encounter (Signed)
Last office visit 08/20/2014.  Ok to refill?

## 2014-09-22 ENCOUNTER — Observation Stay (HOSPITAL_COMMUNITY)
Admission: EM | Admit: 2014-09-22 | Discharge: 2014-09-24 | Disposition: A | Discharge status: Home / Self Care | Payer: BC Managed Care – PPO | Attending: Internal Medicine | Admitting: Internal Medicine

## 2014-09-22 ENCOUNTER — Emergency Department (HOSPITAL_COMMUNITY): Payer: BC Managed Care – PPO

## 2014-09-22 ENCOUNTER — Encounter (HOSPITAL_COMMUNITY): Payer: Self-pay | Admitting: Emergency Medicine

## 2014-09-22 DIAGNOSIS — R931 Abnormal findings on diagnostic imaging of heart and coronary circulation: Secondary | ICD-10-CM

## 2014-09-22 DIAGNOSIS — K5090 Crohn's disease, unspecified, without complications: Secondary | ICD-10-CM | POA: Diagnosis present

## 2014-09-22 DIAGNOSIS — E785 Hyperlipidemia, unspecified: Secondary | ICD-10-CM | POA: Diagnosis not present

## 2014-09-22 DIAGNOSIS — Z8249 Family history of ischemic heart disease and other diseases of the circulatory system: Secondary | ICD-10-CM | POA: Insufficient documentation

## 2014-09-22 DIAGNOSIS — Z79899 Other long term (current) drug therapy: Secondary | ICD-10-CM | POA: Insufficient documentation

## 2014-09-22 DIAGNOSIS — R55 Syncope and collapse: Secondary | ICD-10-CM | POA: Insufficient documentation

## 2014-09-22 DIAGNOSIS — K219 Gastro-esophageal reflux disease without esophagitis: Secondary | ICD-10-CM | POA: Diagnosis present

## 2014-09-22 DIAGNOSIS — N809 Endometriosis, unspecified: Secondary | ICD-10-CM | POA: Diagnosis present

## 2014-09-22 DIAGNOSIS — Z792 Long term (current) use of antibiotics: Secondary | ICD-10-CM | POA: Insufficient documentation

## 2014-09-22 DIAGNOSIS — J45909 Unspecified asthma, uncomplicated: Secondary | ICD-10-CM | POA: Diagnosis not present

## 2014-09-22 DIAGNOSIS — R079 Chest pain, unspecified: Secondary | ICD-10-CM

## 2014-09-22 DIAGNOSIS — R002 Palpitations: Secondary | ICD-10-CM | POA: Diagnosis present

## 2014-09-22 DIAGNOSIS — G901 Familial dysautonomia [Riley-Day]: Secondary | ICD-10-CM | POA: Diagnosis present

## 2014-09-22 DIAGNOSIS — R0789 Other chest pain: Principal | ICD-10-CM | POA: Insufficient documentation

## 2014-09-22 DIAGNOSIS — R110 Nausea: Secondary | ICD-10-CM | POA: Diagnosis present

## 2014-09-22 DIAGNOSIS — R9439 Abnormal result of other cardiovascular function study: Secondary | ICD-10-CM

## 2014-09-22 DIAGNOSIS — I200 Unstable angina: Secondary | ICD-10-CM | POA: Clinically undetermined

## 2014-09-22 DIAGNOSIS — F411 Generalized anxiety disorder: Secondary | ICD-10-CM | POA: Diagnosis not present

## 2014-09-22 DIAGNOSIS — F419 Anxiety disorder, unspecified: Secondary | ICD-10-CM | POA: Insufficient documentation

## 2014-09-22 DIAGNOSIS — R42 Dizziness and giddiness: Secondary | ICD-10-CM

## 2014-09-22 DIAGNOSIS — G43909 Migraine, unspecified, not intractable, without status migrainosus: Secondary | ICD-10-CM | POA: Diagnosis present

## 2014-09-22 HISTORY — DX: Other specified postprocedural states: Z98.890

## 2014-09-22 HISTORY — DX: Chest pain, unspecified: R07.9

## 2014-09-22 HISTORY — DX: Other specified postprocedural states: R11.2

## 2014-09-22 HISTORY — DX: Personal history of other diseases of the digestive system: Z87.19

## 2014-09-22 HISTORY — DX: Migraine, unspecified, not intractable, without status migrainosus: G43.909

## 2014-09-22 LAB — URINALYSIS, ROUTINE W REFLEX MICROSCOPIC
Bilirubin Urine: NEGATIVE
Glucose, UA: NEGATIVE mg/dL
KETONES UR: NEGATIVE mg/dL
LEUKOCYTES UA: NEGATIVE
NITRITE: NEGATIVE
PH: 6 (ref 5.0–8.0)
Protein, ur: NEGATIVE mg/dL
SPECIFIC GRAVITY, URINE: 1.004 — AB (ref 1.005–1.030)
Urobilinogen, UA: 0.2 mg/dL (ref 0.0–1.0)

## 2014-09-22 LAB — COMPREHENSIVE METABOLIC PANEL
ALT: 13 U/L (ref 0–35)
ANION GAP: 13 (ref 5–15)
AST: 15 U/L (ref 0–37)
Albumin: 3.7 g/dL (ref 3.5–5.2)
Alkaline Phosphatase: 92 U/L (ref 39–117)
BUN: 15 mg/dL (ref 6–23)
CO2: 23 meq/L (ref 19–32)
CREATININE: 0.63 mg/dL (ref 0.50–1.10)
Calcium: 8.6 mg/dL (ref 8.4–10.5)
Chloride: 105 mEq/L (ref 96–112)
GFR calc Af Amer: >90 mL/min (ref >90)
GLUCOSE: 90 mg/dL (ref 70–99)
Potassium: 3.8 mEq/L (ref 3.7–5.3)
Sodium: 141 mEq/L (ref 137–147)
TOTAL PROTEIN: 6.7 g/dL (ref 6.0–8.3)
Total Bilirubin: <0.2 mg/dL — ABNORMAL LOW (ref 0.3–1.2)

## 2014-09-22 LAB — URINE MICROSCOPIC-ADD ON

## 2014-09-22 LAB — CBC
HCT: 36 % (ref 36.0–46.0)
Hemoglobin: 12 g/dL (ref 12.0–15.0)
MCH: 28.6 pg (ref 26.0–34.0)
MCHC: 33.3 g/dL (ref 30.0–36.0)
MCV: 85.7 fL (ref 78.0–100.0)
PLATELETS: 321 10*3/uL (ref 150–400)
RBC: 4.2 MIL/uL (ref 3.87–5.11)
RDW: 13.1 % (ref 11.5–15.5)
WBC: 10.3 10*3/uL (ref 4.0–10.5)

## 2014-09-22 LAB — I-STAT TROPONIN, ED: Troponin i, poc: 0 ng/mL (ref 0.00–0.08)

## 2014-09-22 LAB — PREGNANCY, URINE: PREG TEST UR: NEGATIVE

## 2014-09-22 MED ORDER — ALPRAZOLAM 0.5 MG PO TABS: 0.5000 mg | ORAL_TABLET | Freq: Every day | ORAL | Status: DC | PRN

## 2014-09-22 MED ORDER — ASPIRIN EC 81 MG PO TBEC
81.0000 mg | DELAYED_RELEASE_TABLET | Freq: Every day | ORAL | Status: DC
  Administered 2014-09-23: 81 mg via ORAL
  Filled 2014-09-22 (×2): qty 1

## 2014-09-22 MED ORDER — NITROGLYCERIN 0.4 MG SL SUBL: 0.4000 mg | SUBLINGUAL_TABLET | SUBLINGUAL | Status: DC | PRN

## 2014-09-22 MED ORDER — SODIUM CHLORIDE 0.9 % IV BOLUS (SEPSIS)
1000.0000 mL | Freq: Once | INTRAVENOUS | Status: AC
  Administered 2014-09-22: 1000 mL via INTRAVENOUS

## 2014-09-22 MED ORDER — ALBUTEROL SULFATE HFA 108 (90 BASE) MCG/ACT IN AERS: 2.0000 | INHALATION_SPRAY | Freq: Four times a day (QID) | RESPIRATORY_TRACT | Status: DC | PRN

## 2014-09-22 MED ORDER — ZOLPIDEM TARTRATE 5 MG PO TABS
5.0000 mg | ORAL_TABLET | Freq: Every day | ORAL | Status: DC
  Administered 2014-09-22 – 2014-09-23 (×2): 5 mg via ORAL
  Filled 2014-09-22 (×2): qty 1

## 2014-09-22 MED ORDER — HEPARIN SODIUM (PORCINE) 5000 UNIT/ML IJ SOLN
5000.0000 [IU] | Freq: Three times a day (TID) | INTRAMUSCULAR | Status: DC
  Administered 2014-09-22 – 2014-09-24 (×5): 5000 [IU] via SUBCUTANEOUS
  Filled 2014-09-22 (×8): qty 1

## 2014-09-22 MED ORDER — MORPHINE SULFATE 4 MG/ML IJ SOLN
4.0000 mg | Freq: Once | INTRAMUSCULAR | Status: AC
  Administered 2014-09-22: 4 mg via INTRAVENOUS
  Filled 2014-09-22: qty 1

## 2014-09-22 MED ORDER — ACETAMINOPHEN 325 MG PO TABS: 650.0000 mg | ORAL_TABLET | ORAL | Status: DC | PRN

## 2014-09-22 MED ORDER — SODIUM CHLORIDE 0.9 % IJ SOLN
10.0000 mL | INTRAMUSCULAR | Status: DC | PRN
  Administered 2014-09-22 – 2014-09-24 (×4): 10 mL

## 2014-09-22 MED ORDER — ONDANSETRON HCL 4 MG/2ML IJ SOLN
4.0000 mg | Freq: Once | INTRAMUSCULAR | Status: AC
  Administered 2014-09-22: 4 mg via INTRAVENOUS
  Filled 2014-09-22: qty 2

## 2014-09-22 MED ORDER — LORATADINE 10 MG PO TABS
10.0000 mg | ORAL_TABLET | Freq: Every day | ORAL | Status: DC
  Administered 2014-09-23: 10 mg via ORAL
  Filled 2014-09-22 (×2): qty 1

## 2014-09-22 MED ORDER — MORPHINE SULFATE 2 MG/ML IJ SOLN: 1.0000 mg | INTRAMUSCULAR | Status: DC | PRN

## 2014-09-22 MED ORDER — PANTOPRAZOLE SODIUM 40 MG PO TBEC
40.0000 mg | DELAYED_RELEASE_TABLET | Freq: Every day | ORAL | Status: DC
  Administered 2014-09-22 – 2014-09-24 (×3): 40 mg via ORAL
  Filled 2014-09-22 (×3): qty 1

## 2014-09-22 MED ORDER — REGADENOSON 0.4 MG/5ML IV SOLN
0.4000 mg | Freq: Once | INTRAVENOUS | Status: AC
  Administered 2014-09-23: 0.4 mg via INTRAVENOUS
  Filled 2014-09-22: qty 5

## 2014-09-22 MED ORDER — ALBUTEROL SULFATE (2.5 MG/3ML) 0.083% IN NEBU: 2.5000 mg | INHALATION_SOLUTION | Freq: Four times a day (QID) | RESPIRATORY_TRACT | Status: DC | PRN

## 2014-09-22 MED ORDER — SERTRALINE HCL 50 MG PO TABS
50.0000 mg | ORAL_TABLET | Freq: Every day | ORAL | Status: DC
  Administered 2014-09-23 (×2): 50 mg via ORAL
  Filled 2014-09-22 (×5): qty 1

## 2014-09-22 MED ORDER — ASPIRIN 300 MG RE SUPP: 300.0000 mg | RECTAL | Status: AC

## 2014-09-22 MED ORDER — ASPIRIN 81 MG PO CHEW: 324.0000 mg | CHEWABLE_TABLET | ORAL | Status: AC

## 2014-09-22 MED ORDER — ONDANSETRON HCL 4 MG/2ML IJ SOLN
4.0000 mg | Freq: Four times a day (QID) | INTRAMUSCULAR | Status: DC | PRN
  Administered 2014-09-23: 4 mg via INTRAVENOUS

## 2014-09-22 NOTE — ED Provider Notes (Signed)
CSN: [id number]     Arrival date & time 09/22/14  1103 History   First MD Initiated Contact with Patient 09/22/14 1149     Chief Complaint  Patient presents with  . Chest Pain     (Consider location/radiation/quality/duration/timing/severity/associated sxs/prior Treatment) HPI  Patient presents with chest pain.  Chest pain began suddenly at 9am while she was standing in a hallway, associated lightheadedness and nausea.  Pain described as heaviness, sharp, squeezing, pressure.  Radiates to right chest and into left face.  EMS gave nitroglycerin for complete relief for only approximately 5 minutes, then the pain returned.  Pt has family hx CAD, father died from MI at 61.   No recent immobilization, leg swelling, exogenous estrogen.  Pt currently breastfeeding, baby is 61 year old; had hx of preeclampsia with HTN during that pregnancy, none since.   Past Medical History  Diagnosis Date  . Crohn disease 2000    remission  . Asthma     no intubations or ICU stays  . Endometriosis   . Cat allergies   . Dysautonomia     recurrent syncope s/p ILR by Dr Doreatha Lew  . Palpitations   . History of recurrent UTIs   . Chronic nausea     around ovulation time  . Anxiety   . GERD (gastroesophageal reflux disease)   . Headache(784.0)   . Vertigo    Past Surgical History  Procedure Laterality Date  . Ileocolectomy    . Loop recorder implant  12/10    by DR Doreatha Lew  . Knee surgery  1990    1988  . Cesarean section      2011, emergency  CS due to placental abruption,2014  . Colon surgery  2002    partial removed due to crohns  . Appendectomy    . Tonsillectomy    . US echocardiography  11/11/2009    EF 55-60%  . [place] surgery  [phone]    due to Ectopic Pregnancy  . Nasal sinus surgery      ? 2007   Family History  Problem Relation Age of Onset  . Hypertension Mother   . Hyperlipidemia Mother   . Arthritis Mother   . Diabetes Father   . Coronary artery disease Father   .  Heart disease Father 39    MI age 39  . Hyperlipidemia Brother   . Hypertension Brother    History  Substance Use Topics  . Smoking status: Never Smoker   . Smokeless tobacco: Never Used  . Alcohol Use: No     Comment: occasionally   OB History   Grav Para Term Preterm Abortions TAB SAB Ect Mult Living   3 1 0 1 1 0 0 1 0 1      Review of Systems  All other systems reviewed and are negative.     Allergies  Compazine; Reglan; Sulfonamide derivatives; Clarithromycin; Sulfamethoxazole; Biaxin; Codeine; Hydrocodone; Macrobid; Phenergan; Promethazine; Promethazine hcl; and Sulfa antibiotics  Home Medications   Prior to Admission medications   Medication Sig Start Date End Date Taking? Authorizing Provider  albuterol (PROVENTIL HFA;VENTOLIN HFA) 108 (90 BASE) MCG/ACT inhaler Inhale 2 puffs into the lungs every 6 (six) hours as needed for wheezing or shortness of breath. 12/31/13   Amy Cletis Athens, MD  ALPRAZolam (XANAX) 0.25 MG tablet Take 0.5 mg by mouth daily as needed for anxiety.    Historical Provider, MD  butalbital-acetaminophen-caffeine (FIORICET, ESGIC) 50-325-40 MG per tablet Take 2 tablets by mouth 2 (  two) times daily as needed for headache.    Historical Provider, MD  cephALEXin (KEFLEX) 500 MG capsule Take 1 capsule (500 mg total) by mouth 4 (four) times daily. 08/27/14   Amy Cletis Athens, MD  cetirizine (ZYRTEC) 10 MG tablet Take 10 mg by mouth daily.    Historical Provider, MD  dexlansoprazole (DEXILANT) 60 MG capsule Take 60 mg by mouth daily.    Historical Provider, MD  fluconazole (DIFLUCAN) 150 MG tablet TAKE 1 TABLET AS ONE DOSE. 09/09/14   Amy Cletis Athens, MD  HYDROmorphone (DILAUDID) 2 MG tablet Take 2 mg by mouth every 6 (six) hours as needed for severe pain (pain).    Historical Provider, MD  meclizine (ANTIVERT) 50 MG tablet Take 1 tablet (50 mg total) by mouth 3 (three) times daily. 08/25/14   Charlynne Cousins, MD  naratriptan (AMERGE) 2.5 MG tablet Take 1 tablet (2.5  mg total) by mouth as needed for migraine. Take one (1) tablet at onset of headache; if returns or does not resolve, may repeat after 4 hours; do not exceed five (5) mg in 24 hours. 06/22/14   Marcial Pacas, MD  omeprazole (PRILOSEC) 20 MG capsule Take 20 mg by mouth 2 (two) times daily as needed (acid reflux).    Historical Provider, MD  ondansetron (ZOFRAN-ODT) 8 MG disintegrating tablet Take 8 mg by mouth daily.  06/28/14   Historical Provider, MD  phenazopyridine (PYRIDIUM) 200 MG tablet Take 1 tablet (200 mg total) by mouth 3 (three) times daily as needed for pain. 08/20/14   Amy Cletis Athens, MD  Prenatal Vit-Fe Fumarate-FA (PRENATAL PO) Take 1 tablet by mouth daily.    Historical Provider, MD  sertraline (ZOLOFT) 50 MG tablet Take 50 mg by mouth daily.    Historical Provider, MD  zolpidem (AMBIEN) 10 MG tablet TAKE ONE TABLET BY MOUTH EVERY NIGHT AT BEDTIME 09/16/14   Amy E Bedsole, MD   BP 104/73  Pulse 78  Temp(Src) 98.1 F (36.7 C) (Oral)  Resp 17  Ht 5' 4"  (1.626 m)  Wt 154 lb (69.854 kg)  BMI 26.42 kg/m2  SpO2 100%  LMP 09/16/2014 Physical Exam  Nursing note and vitals reviewed. Constitutional: She appears well-developed and well-nourished. No distress.  HENT:  Head: Normocephalic and atraumatic.  Eyes: Conjunctivae are normal.  Neck: Neck supple.  Cardiovascular: Normal rate and regular rhythm.   Pulmonary/Chest: Effort normal and breath sounds normal. No respiratory distress. She has no wheezes. She has no rales.  Abdominal: Soft. She exhibits no distension. There is no tenderness. There is no rebound and no guarding.  Musculoskeletal: She exhibits no edema.  Neurological: She is alert.  Skin: She is not diaphoretic.  Psychiatric: Her mood appears anxious.    ED Course  Procedures (including critical care time) Labs Review Labs Reviewed  COMPREHENSIVE METABOLIC PANEL - Abnormal; Notable for the following:    Total Bilirubin <0.2 (*)    All other components within normal  limits  URINALYSIS, ROUTINE W REFLEX MICROSCOPIC - Abnormal; Notable for the following:    Specific Gravity, Urine 1.004 (*)    Hgb urine dipstick SMALL (*)    All other components within normal limits  CBC  URINE MICROSCOPIC-ADD ON  PREGNANCY, URINE  I-STAT TROPOININ, ED    Imaging Review Dg Chest 2 View  09/22/2014   CLINICAL DATA:  Chest pain, dizziness, history Crohn's disease, asthma, endometriosis, GERD  EXAM: CHEST  2 VIEW  COMPARISON:  08/24/2014  FINDINGS: RIGHT jugular  Port-A-Cath with tip projecting at [facility].  Loop recorder projects over LEFT chest.  BILATERAL nipple shadows.  Normal heart size, mediastinal contours, and pulmonary vascularity.  Lungs clear.  No pleural effusion or pneumothorax.  Bones unremarkable.  IMPRESSION: No acute abnormalities.   Electronically Signed   By: Lavonia Dana M.D.   On: 09/22/2014 12:11     EKG Interpretation None       Date: 09/22/2014  Rate: 83  Rhythm: ectopic atrial rhythm  QRS Axis: normal  Intervals: QT prolonged  ST/T Wave abnormalities: nonspecific T wave changes  Conduction Disutrbances:none  Narrative Interpretation:   Old EKG Reviewed: changes noted and QTc improved, mild changes in T waves    Pt has loop recorder in place.  [employer] device report is unclear - states she has had 4 episodes of VT since 2013 but not when most recent one was.  Per [employer] representative, loop recorder has probably not been working for the past year.   2:28 PM Discussed with Trish. Cardiology to see patient.    MDM   Final diagnoses:  Chest pain, unspecified chest pain type  Lightheadedness   Pt with concern episode of left chest pressure with radiation into left jaw and right chest, associated lightheadedness, nausea, persistent pain in ED.  Family hx early CAD.  Pt has hx loop recorder with remote episodes of VT recorded, battery has been unreliable for the past year.  Recommended admission to patient and family.  Patient prefers not to  be admitted as she was admitted for 46 days during her last pregnancy last year - I have asked cardiology to see and evaluate patient and give recommendations for further evaluation.  Pt's cardiologist is Dr Rayann Heman.    PERC negative HEART score 1 TIMI 0  3:59 PM Cardiology consult pending at change of shift.  Dr Dina Rich made aware of patient pending cardiology recommendations.    Clayton Bibles, PA-C 09/22/14 1600

## 2014-09-22 NOTE — H&P (Addendum)
Primary Cardiologist: Dr. Lovena Le  Chief Complaint: Chest Pain  HPI: The patient is a 38 y/o female, followed by Dr. Lovena Le. She has a history of neurally mediated syncope. This was treated in the past with Florinef, however this was discontinued 2 years ago when she was trying to get pregnant. The patient also has Crohn's disease and endometriosis. She has chronic port-a-cath that was placed over 1 year ago due to poor vascular access and frequent need for IV therapy for Crohn flares. This has been followed by [facility]. Her last Crohns flare was in August of this year. 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   Past cardiac workup has included a stress echo in 2010 that was negative for ischemia. LV function was normal. Trace MR was noted. She also has a loop recorder that was implanted by Dr. Lovena Le. She denies any prior history of hypertension, diabetes or hyperlipidemia. She does note a strong family history of early CAD noting that her father died from MI at age 26.  She reports that she was in her usual state of health until 3 days ago when she experienced unexplained fatigue and generalized malaise. She was feeling better earlier this morning until she developed sudden onset of left-sided chest pain. The episode occurred while she was at work [employer]). The pain was pressure-like and radiated across her entire chest. She noted associated dyspnea, nausea without vomiting, dizziness and near syncope. No awareness of palpitations. No alleviating or worsening factors. EMS was called. On arrival, her blood pressure was checked and systolic pressure was noted to be in the 150s, which the patient reports is extremely high for her. She was given aspirin and sublingual nitroglycerin and transferred to [facility] for further evaluation. She reports that her chest pain resided after the aspirin and nitroglycerin. However, when she arrived to the ED, she had  recurrent chest pain this time radiating up the left neck and left jaw. Her pain was 8/10 at its worse. It has now resided to about a 3/10.   In the ED, her EKG demonstrates borderline T wave abnormalities in the anterior leads. CXR is unremarkable.  Initial troponins negative. Scr normal at 0.57.    Past Medical History  Diagnosis Date  . Crohn disease 2000    remission  . Asthma     no intubations or ICU stays  . Endometriosis   . Cat allergies   . Dysautonomia     recurrent syncope s/p ILR by Dr Doreatha Lew  . Palpitations   . History of recurrent UTIs   . Chronic nausea     around ovulation time  . Anxiety   . GERD (gastroesophageal reflux disease)   . Headache(784.0)   . Vertigo     Past Surgical History  Procedure Laterality Date  . Ileocolectomy    . Loop recorder implant  12/10    by DR Doreatha Lew  . Knee surgery  1990    1988  . Cesarean section      2011, emergency  CS due to placental abruption,2014  . Colon surgery  2002    partial removed due to crohns  . Appendectomy    . Tonsillectomy    . US echocardiography  11/11/2009    EF 55-60%  . [place] surgery  [phone]    due to Ectopic Pregnancy  . Nasal sinus surgery      ? 2007    Family History  Problem Relation  Age of Onset  . Hypertension Mother   . Hyperlipidemia Mother   . Arthritis Mother   . Diabetes Father   . Coronary artery disease Father   . Heart disease Father 68    MI age 62  . Hyperlipidemia Brother   . Hypertension Brother    Social History:  reports that she has never smoked. She has never used smokeless tobacco. She reports that she does not drink alcohol or use illicit drugs.  Allergies:  Allergies  Allergen Reactions  . Compazine [Prochlorperazine Edisylate] Anaphylaxis  . Reglan [Metoclopramide] Anaphylaxis and Other (See Comments)    Other reaction(s): GI Upset (intolerance) Intensifies her Chron's  . Sulfonamide Derivatives Other (See Comments)    Hematemesis;  documented while a young child  . Clarithromycin Other (See Comments)    Reacts with chron's disease  . Sulfamethoxazole Rash and Other (See Comments)    Internal bleeding and kidney infection  . Biaxin [Clarithromycin] Other (See Comments)    Cant take due to Chrons disease  . Codeine Nausea And Vomiting and Other (See Comments)    Dilaudid and demerol OK  . Hydrocodone Nausea And Vomiting  . Macrobid [Nitrofurantoin] Nausea And Vomiting  . Phenergan [Promethazine Hcl] Other (See Comments)    "muscle twitches"  . Promethazine Rash and Other (See Comments)    Muscular seizures  . Promethazine Hcl Other (See Comments)    Muscular seizures  . Sulfa Antibiotics Hives and Other (See Comments)    Hematemesis; documented while a young child     (Not in a hospital admission)  Results for orders placed during the hospital encounter of 09/22/14 (from the past 48 hour(s))  URINALYSIS, ROUTINE W REFLEX MICROSCOPIC     Status: Abnormal   Collection Time    09/22/14 11:35 AM      Result Value Ref Range   Color, Urine YELLOW  YELLOW   APPearance CLEAR  CLEAR   Specific Gravity, Urine 1.004 (*) 1.005 - 1.030   pH 6.0  5.0 - 8.0   Glucose, UA NEGATIVE  NEGATIVE mg/dL   Hgb urine dipstick SMALL (*) NEGATIVE   Bilirubin Urine NEGATIVE  NEGATIVE   Ketones, ur NEGATIVE  NEGATIVE mg/dL   Protein, ur NEGATIVE  NEGATIVE mg/dL   Urobilinogen, UA 0.2  0.0 - 1.0 mg/dL   Nitrite NEGATIVE  NEGATIVE   Leukocytes, UA NEGATIVE  NEGATIVE  URINE MICROSCOPIC-ADD ON     Status: None   Collection Time    09/22/14 11:35 AM      Result Value Ref Range   Squamous Epithelial / LPF RARE  RARE   WBC, UA 0-2  <3 WBC/hpf   RBC / HPF 0-2  <3 RBC/hpf  PREGNANCY, URINE     Status: None   Collection Time    09/22/14 11:35 AM      Result Value Ref Range   Preg Test, Ur NEGATIVE  NEGATIVE   Comment:            THE SENSITIVITY OF THIS     METHODOLOGY IS >20 mIU/mL.  COMPREHENSIVE METABOLIC PANEL     Status:  Abnormal   Collection Time    09/22/14 12:45 PM      Result Value Ref Range   Sodium 141  137 - 147 mEq/L   Potassium 3.8  3.7 - 5.3 mEq/L   Chloride 105  96 - 112 mEq/L   CO2 23  19 - 32 mEq/L   Glucose, Bld 90  70 - 99 mg/dL   BUN 15  6 - 23 mg/dL   Creatinine, Ser 0.63  0.50 - 1.10 mg/dL   Calcium 8.6  8.4 - 10.5 mg/dL   Total Protein 6.7  6.0 - 8.3 g/dL   Albumin 3.7  3.5 - 5.2 g/dL   AST 15  0 - 37 U/L   ALT 13  0 - 35 U/L   Alkaline Phosphatase 92  39 - 117 U/L   Total Bilirubin <0.2 (*) 0.3 - 1.2 mg/dL   GFR calc non Af Amer >90  >90 mL/min   GFR calc Af Amer >90  >90 mL/min   Comment: (NOTE)     The eGFR has been calculated using the CKD EPI equation.     This calculation has not been validated in all clinical situations.     eGFR's persistently <90 mL/min signify possible Chronic Kidney     Disease.   Anion gap 13  5 - 15  CBC     Status: None   Collection Time    09/22/14 12:45 PM      Result Value Ref Range   WBC 10.3  4.0 - 10.5 K/uL   RBC 4.20  3.87 - 5.11 MIL/uL   Hemoglobin 12.0  12.0 - 15.0 g/dL   HCT 36.0  36.0 - 46.0 %   MCV 85.7  78.0 - 100.0 fL   MCH 28.6  26.0 - 34.0 pg   MCHC 33.3  30.0 - 36.0 g/dL   RDW 13.1  11.5 - 15.5 %   Platelets 321  150 - 400 K/uL  I-STAT TROPOININ, ED     Status: None   Collection Time    09/22/14 12:55 PM      Result Value Ref Range   Troponin i, poc 0.00  0.00 - 0.08 ng/mL   Comment 3            Comment: Due to the release kinetics of cTnI,     a negative result within the first hours     of the onset of symptoms does not rule out     myocardial infarction with certainty.     If myocardial infarction is still suspected,     repeat the test at appropriate intervals.   Dg Chest 2 View  09/22/2014   CLINICAL DATA:  Chest pain, dizziness, history Crohn's disease, asthma, endometriosis, GERD  EXAM: CHEST  2 VIEW  COMPARISON:  08/24/2014  FINDINGS: RIGHT jugular Port-A-Cath with tip projecting at [facility].  Loop recorder  projects over LEFT chest.  BILATERAL nipple shadows.  Normal heart size, mediastinal contours, and pulmonary vascularity.  Lungs clear.  No pleural effusion or pneumothorax.  Bones unremarkable.  IMPRESSION: No acute abnormalities.   Electronically Signed   By: Lavonia Dana M.D.   On: 09/22/2014 12:11    Review of Systems  Constitutional: Positive for malaise/fatigue. Negative for diaphoresis.  Respiratory: Positive for shortness of breath.   Cardiovascular: Positive for chest pain. Negative for palpitations and leg swelling.  Gastrointestinal: Positive for nausea. Negative for vomiting.  Neurological: Positive for dizziness. Negative for loss of consciousness.  All other systems reviewed and are negative.   Blood pressure 101/64, pulse 82, temperature 98.1 F (36.7 C), temperature source Oral, resp. rate 12, height _0  (1.626 m), weight 154 lb (69.854 kg), last menstrual period 09/16/2014, SpO2 100.00%. Physical Exam  Constitutional: She is oriented to person, place, and time. She appears well-developed and well-nourished. No distress.  Neck: No JVD present. Carotid bruit is not present.  Cardiovascular: Normal rate, regular rhythm, normal heart sounds and intact distal pulses.  Exam reveals no gallop and no friction rub.   No murmur heard. Respiratory: Effort normal and breath sounds normal. No respiratory distress. She has no wheezes. She has no rales. She exhibits no tenderness.  GI: Soft. Bowel sounds are normal. She exhibits no distension and no mass. There is no tenderness.  Musculoskeletal: She exhibits no edema.  Neurological: She is alert and oriented to person, place, and time.  Skin: Skin is warm and dry. She is not diaphoretic.  Psychiatric: She has a normal mood and affect. Her behavior is normal.     Assessment/Plan Active Problems:   Chest pain  1. Chest pain: EKG with borderline T-wave abnormalities. HR and BP both well controlled. Risk factors are notable for family  history of early CAD. She has some typical/atypical features. Will admit for observation. We'll continue to cycle cardiac enzymes. If negative, we'll proceed with nuclear stress testing in the a.m. Monitor on telemetry. N.p.o. at midnight. PRN sublingual nitroglycerin. We'll also check a fasting lipid panel in the a.m. and hemoglobin A1C for risk factor assessment.   SIMMONS, BRITTAINY 09/22/2014, 5:16 PM  I have personally seen and examined this patient with Lyda Jester, PA-C. I agree with the assessment and plan as outlined above. Ongoing chest pain for hours. EKG with mild changes but no obvious ischemia. Troponin negative x 1. Her pain has some atypical features and only minimally responsive to NTG and morphine. Will admit and rule out for MI with serial cardiac markers. If cardiac markers remain negative, will plan stress myoview in am. She is nursing but has frozen milk to use for feeding her infant if we proceed with nuclear study. She has a Loop recorder in place, battery is nonfunctional. She has been followed by Dr. Lovena Le for near syncope but no visits in last 2 years. Will need to discuss with Dr. Lovena Le tomorrow regarding her Loop recorder. She still reports weekly episodes of near syncope.   Kitara Hebb 09/22/2014 5:42 PM

## 2014-09-22 NOTE — ED Notes (Signed)
Pt has port and wishes to have labs drawn from this site. Called IV team

## 2014-09-22 NOTE — ED Notes (Signed)
Admitting MD at the bedside.  

## 2014-09-22 NOTE — ED Notes (Signed)
Pt at work, started feeling nauseous and developed CP- feeling weak/dizzy. Pt felt like she was going to pass out. Chest pain travels up left neck/left side of face. Pt has worn a heart monitor for these types of episodes- no diagnosis. EMS gave 3 nitro. Pain still 8/10.  EKG shows SR. HR 80's. BP 115/76.

## 2014-09-23 ENCOUNTER — Observation Stay (HOSPITAL_COMMUNITY): Payer: BC Managed Care – PPO

## 2014-09-23 ENCOUNTER — Encounter: Payer: BC Managed Care – PPO | Admitting: Physical Therapy

## 2014-09-23 ENCOUNTER — Encounter (HOSPITAL_COMMUNITY): Payer: BC Managed Care – PPO

## 2014-09-23 DIAGNOSIS — Z79899 Other long term (current) drug therapy: Secondary | ICD-10-CM | POA: Diagnosis not present

## 2014-09-23 DIAGNOSIS — Z8249 Family history of ischemic heart disease and other diseases of the circulatory system: Secondary | ICD-10-CM | POA: Diagnosis not present

## 2014-09-23 DIAGNOSIS — J45909 Unspecified asthma, uncomplicated: Secondary | ICD-10-CM | POA: Diagnosis not present

## 2014-09-23 DIAGNOSIS — F419 Anxiety disorder, unspecified: Secondary | ICD-10-CM | POA: Diagnosis not present

## 2014-09-23 DIAGNOSIS — E785 Hyperlipidemia, unspecified: Secondary | ICD-10-CM

## 2014-09-23 DIAGNOSIS — Z792 Long term (current) use of antibiotics: Secondary | ICD-10-CM | POA: Diagnosis not present

## 2014-09-23 DIAGNOSIS — R55 Syncope and collapse: Secondary | ICD-10-CM | POA: Diagnosis not present

## 2014-09-23 DIAGNOSIS — R079 Chest pain, unspecified: Secondary | ICD-10-CM

## 2014-09-23 DIAGNOSIS — R0789 Other chest pain: Secondary | ICD-10-CM | POA: Diagnosis not present

## 2014-09-23 DIAGNOSIS — K219 Gastro-esophageal reflux disease without esophagitis: Secondary | ICD-10-CM | POA: Diagnosis not present

## 2014-09-23 DIAGNOSIS — K5090 Crohn's disease, unspecified, without complications: Secondary | ICD-10-CM | POA: Diagnosis not present

## 2014-09-23 LAB — BASIC METABOLIC PANEL
Anion gap: 11 (ref 5–15)
Anion gap: 10 (ref 5–15)
BUN: 15 mg/dL (ref 6–23)
BUN: 15 mg/dL (ref 6–23)
CALCIUM: 8 mg/dL — AB (ref 8.4–10.5)
CALCIUM: 8.3 mg/dL — AB (ref 8.4–10.5)
CO2: 24 mEq/L (ref 19–32)
CO2: 26 meq/L (ref 19–32)
CREATININE: 0.69 mg/dL (ref 0.50–1.10)
Chloride: 107 mEq/L (ref 96–112)
Chloride: 104 mEq/L (ref 96–112)
Creatinine, Ser: 0.67 mg/dL (ref 0.50–1.10)
GFR calc Af Amer: >90 mL/min (ref >90)
GFR calc non Af Amer: >90 mL/min (ref >90)
GLUCOSE: 133 mg/dL — AB (ref 70–99)
Glucose, Bld: 96 mg/dL (ref 70–99)
Potassium: 3.7 mEq/L (ref 3.7–5.3)
Potassium: 3.5 mEq/L — ABNORMAL LOW (ref 3.7–5.3)
SODIUM: 142 meq/L (ref 137–147)
Sodium: 140 mEq/L (ref 137–147)

## 2014-09-23 LAB — LIPID PANEL
CHOL/HDL RATIO: 4.2 ratio
Cholesterol: 186 mg/dL (ref 0–200)
HDL: 44 mg/dL (ref >39)
LDL Cholesterol: 109 mg/dL — ABNORMAL HIGH (ref 0–99)
Triglycerides: 165 mg/dL — ABNORMAL HIGH (ref <150)
VLDL: 33 mg/dL (ref 0–40)

## 2014-09-23 LAB — CBC
HCT: 35.8 % — ABNORMAL LOW (ref 36.0–46.0)
HCT: 33.8 % — ABNORMAL LOW (ref 36.0–46.0)
HEMOGLOBIN: 11.2 g/dL — AB (ref 12.0–15.0)
Hemoglobin: 11.7 g/dL — ABNORMAL LOW (ref 12.0–15.0)
MCH: 27.9 pg (ref 26.0–34.0)
MCH: 28.1 pg (ref 26.0–34.0)
MCHC: 32.7 g/dL (ref 30.0–36.0)
MCHC: 33.1 g/dL (ref 30.0–36.0)
MCV: 85.2 fL (ref 78.0–100.0)
MCV: 84.9 fL (ref 78.0–100.0)
PLATELETS: 314 10*3/uL (ref 150–400)
Platelets: 304 10*3/uL (ref 150–400)
RBC: 4.2 MIL/uL (ref 3.87–5.11)
RBC: 3.98 MIL/uL (ref 3.87–5.11)
RDW: 13.3 % (ref 11.5–15.5)
RDW: 13.1 % (ref 11.5–15.5)
WBC: 7.4 10*3/uL (ref 4.0–10.5)
WBC: 7.7 10*3/uL (ref 4.0–10.5)

## 2014-09-23 LAB — TROPONIN I
Troponin I: <0.3 ng/mL (ref <0.30)
Troponin I: <0.3 ng/mL (ref <0.30)

## 2014-09-23 LAB — PROTIME-INR
INR: 1.1 (ref 0.00–1.49)
INR: 1.03 (ref 0.00–1.49)
PROTHROMBIN TIME: 14.2 s (ref 11.6–15.2)
Prothrombin Time: 13.5 seconds (ref 11.6–15.2)

## 2014-09-23 LAB — HEMOGLOBIN A1C
Hgb A1c MFr Bld: 5.6 % (ref <5.7)
MEAN PLASMA GLUCOSE: 114 mg/dL (ref <117)

## 2014-09-23 LAB — CREATININE, SERUM
Creatinine, Ser: 0.69 mg/dL (ref 0.50–1.10)
GFR calc non Af Amer: >90 mL/min (ref >90)

## 2014-09-23 MED ORDER — SODIUM CHLORIDE 0.9 % IJ SOLN
3.0000 mL | Freq: Two times a day (BID) | INTRAMUSCULAR | Status: DC
  Administered 2014-09-24: 3 mL via INTRAVENOUS

## 2014-09-23 MED ORDER — HYDROMORPHONE HCL 1 MG/ML IJ SOLN
0.5000 mg | INTRAMUSCULAR | Status: DC | PRN
  Administered 2014-09-23: 0.5 mg via INTRAVENOUS
  Filled 2014-09-23: qty 1

## 2014-09-23 MED ORDER — REGADENOSON 0.4 MG/5ML IV SOLN
INTRAVENOUS | Status: AC
  Administered 2014-09-23: 0.4 mg via INTRAVENOUS
  Filled 2014-09-23: qty 5

## 2014-09-23 MED ORDER — ASPIRIN 81 MG PO CHEW
81.0000 mg | CHEWABLE_TABLET | ORAL | Status: AC
  Administered 2014-09-24: 81 mg via ORAL
  Filled 2014-09-23: qty 1

## 2014-09-23 MED ORDER — SODIUM CHLORIDE 0.9 % IV SOLN: 250.0000 mL | INTRAVENOUS | Status: DC | PRN

## 2014-09-23 MED ORDER — SODIUM CHLORIDE 0.9 % IJ SOLN: 3.0000 mL | INTRAMUSCULAR | Status: DC | PRN

## 2014-09-23 MED ORDER — BUTALBITAL-APAP-CAFFEINE 50-325-40 MG PO TABS: 2.0000 | ORAL_TABLET | Freq: Two times a day (BID) | ORAL | Status: DC | PRN

## 2014-09-23 MED ORDER — TECHNETIUM TC 99M SESTAMIBI GENERIC - CARDIOLITE
30.0000 | Freq: Once | INTRAVENOUS | Status: AC | PRN
  Administered 2014-09-23: 30 via INTRAVENOUS

## 2014-09-23 MED ORDER — SODIUM CHLORIDE 0.9 % IV SOLN
INTRAVENOUS | Status: DC
  Administered 2014-09-24: 100 mL via INTRAVENOUS

## 2014-09-23 MED ORDER — TECHNETIUM TC 99M SESTAMIBI GENERIC - CARDIOLITE
10.0000 | Freq: Once | INTRAVENOUS | Status: AC | PRN
  Administered 2014-09-23: 10 via INTRAVENOUS

## 2014-09-23 MED ORDER — TRAMADOL HCL 50 MG PO TABS
50.0000 mg | ORAL_TABLET | Freq: Four times a day (QID) | ORAL | Status: DC | PRN
  Administered 2014-09-23: 100 mg via ORAL
  Filled 2014-09-23: qty 2

## 2014-09-23 MED ORDER — ONDANSETRON HCL 4 MG/2ML IJ SOLN
INTRAMUSCULAR | Status: AC
  Filled 2014-09-23: qty 2

## 2014-09-23 NOTE — Progress Notes (Signed)
Awaiting Nuclear stress results. Likely discharge if low risk.

## 2014-09-23 NOTE — Progress Notes (Signed)
UR completed 

## 2014-09-23 NOTE — Progress Notes (Signed)
Patient Profile: 38 y/o female with h/o neurally mediated syncope (has a loop recorder but battery is non functional per recent interrogation), Chron's disease and family history of early CAD (father with MI at 58), admitted for evaluation of chest pain.   Subjective: No recurrent chest pain overnight. However, during [place] NST, patient reports substernal chest pressure, dyspnea, bilateral arm tingling and HA. Symptoms improved by test completion.   Objective: Vital signs in last 24 hours: Temp:  [98.1 F (36.7 C)-98.5 F (36.9 C)] 98.5 F (36.9 C) (10/01 0500) Pulse Rate:  [66-93] 73 (10/01 0500) Resp:  [11-23] 18 (10/01 0500) BP: (93-114)/(59-86) 105/67 mmHg (10/01 0500) SpO2:  [100 %] 100 % (10/01 0500) Weight:  [153 lb 12.8 oz (69.763 kg)-154 lb (69.854 kg)] 153 lb 12.8 oz (69.763 kg) (10/01 0500) Last BM Date: 09/22/14  Intake/Output from previous day:   Intake/Output this shift:    Medications Current Facility-Administered Medications  Medication Dose Route Frequency Provider Last Rate Last Dose  . acetaminophen (TYLENOL) tablet 650 mg  650 mg Oral Q4H PRN Brittainy Erie Noe, PA-C      . albuterol (PROVENTIL) (2.5 MG/3ML) 0.083% nebulizer solution 2.5 mg  2.5 mg Nebulization Q6H PRN Evans Lance, MD      . ALPRAZolam Duanne Moron) tablet 0.5 mg  0.5 mg Oral Daily PRN Brittainy Erie Noe, PA-C      . aspirin chewable tablet 324 mg  324 mg Oral NOW Brittainy Erie Noe, PA-C       Or  . aspirin suppository 300 mg  300 mg Rectal NOW Brittainy M Simmons, PA-C      . aspirin EC tablet 81 mg  81 mg Oral Daily Brittainy M Simmons, PA-C      . heparin injection 5,000 Units  5,000 Units Subcutaneous 3 times per day Consuelo Pandy, PA-C   5,000 Units at 09/23/14 0609  . loratadine (CLARITIN) tablet 10 mg  10 mg Oral Daily Brittainy Erie Noe, PA-C      . morphine 2 MG/ML injection 1 mg  1 mg Intravenous Q4H PRN Brittainy M Simmons, PA-C      . nitroGLYCERIN (NITROSTAT) SL  tablet 0.4 mg  0.4 mg Sublingual Q5 Min x 3 PRN Brittainy M Simmons, PA-C      . ondansetron [facility]) injection 4 mg  4 mg Intravenous Q6H PRN Brittainy M Simmons, PA-C      . pantoprazole (PROTONIX) EC tablet 40 mg  40 mg Oral Daily Brittainy Erie Noe, PA-C   40 mg at 09/22/14 2225  . regadenoson (LEXISCAN) 0.4 MG/5ML injection SOLN           . regadenoson (LEXISCAN) injection SOLN 0.4 mg  0.4 mg Intravenous Once Brittainy M Simmons, PA-C      . sertraline (ZOLOFT) tablet 50 mg  50 mg Oral QHS Brittainy M Simmons, PA-C   50 mg at 09/23/14 0103  . sodium chloride 0.9 % injection 10-40 mL  10-40 mL Intracatheter PRN Tanna Furry, MD   10 mL at 09/23/14 0405  . zolpidem (AMBIEN) tablet 5 mg  5 mg Oral QHS Brittainy Erie Noe, PA-C   5 mg at 09/22/14 2225    PE: General appearance: alert, cooperative and no distress Lungs: clear to auscultation bilaterally Heart: regular rate and rhythm Extremities: no LEE Pulses: 2+ and symmetric Skin: warm and dry Neurologic: Grossly normal  Lab Results:   Recent Labs  09/22/14 1245 09/23/14 0405  WBC 10.3 7.4  HGB 12.0 11.7*  HCT 36.0 35.8*  PLT 321 314   BMET  Recent Labs  09/22/14 1245 09/23/14 0405  NA 141 142  K 3.8 3.7  CL 105 107  CO2 23 24  GLUCOSE 90 96  BUN 15 15  CREATININE 0.63 0.67  0.69  CALCIUM 8.6 8.0*   PT/INR  Recent Labs  09/23/14 0405  LABPROT 14.2  INR 1.10   Cholesterol  Recent Labs  09/23/14 0405  CHOL 186   Cardiac Panel (last 3 results)  Recent Labs  09/23/14 0405  TROPONINI <0.30     Assessment/Plan    Active Problems:   Chest pain  1. Chest pain: no recurrent symptoms overnight. Cardiac enzymes negative x 2. NST pending. If normal, can discharge home today.  2. Loop Recorder: battery is non functional. Will discuss recommendations with EP.   3. HLD: Fasting lipid panel revealed LDL is slightly elevated at 109. Triglycerides 165. This is a new diagnosis. If ischemic w/u is  negative, I feel that it would be reasonable to start with lifestyle modification, particularly adjustments through diet and exercise. She should f/u with her PCP in 3-6 months. If levels not improved then she may need medical therapy with a statin.      LOS: 1 day    Brittainy M. Rosita Fire, PA-C 09/23/2014 9:02 AM

## 2014-09-23 NOTE — Progress Notes (Signed)
Pt cont to complain of headache - declines offered Tylenol - request Fiorcet.  Spoke with pt's nurse on her unit and she will get order and try to have med available when pt gets back to the unit shortly.  Informed pt and she is agreeable.

## 2014-09-23 NOTE — Progress Notes (Signed)
   NST results : IMPRESSION:  1. Intermediate risk study with small size, moderate severity  reversible defect in mid LAD territory. The interpretation of this  study might be affected by significant extracardiac activity,  however the presence of transient ischemic dilatation increases the  suspicion for possible ischemia.  2. Hyperdynamic LVEF > 70%, no regional wall motion abnormalities.   I spoke with Dr. Meda Coffee over the phone who has recommended diagnostic LHC. I informed patient of findings and recs for additional ischemic eval. Patient in agreement with plan. Will make NPO at midnight. Will plan for cath tomorrow. Orders written.  Donnarae Rae

## 2014-09-24 ENCOUNTER — Encounter (HOSPITAL_COMMUNITY): Payer: Self-pay | Admitting: Physician Assistant

## 2014-09-24 ENCOUNTER — Encounter (HOSPITAL_COMMUNITY)
Admission: EM | Disposition: A | Discharge status: Home / Self Care | Payer: Self-pay | Source: Home / Self Care | Attending: Emergency Medicine

## 2014-09-24 DIAGNOSIS — G901 Familial dysautonomia [Riley-Day]: Secondary | ICD-10-CM | POA: Diagnosis present

## 2014-09-24 DIAGNOSIS — K5090 Crohn's disease, unspecified, without complications: Secondary | ICD-10-CM | POA: Diagnosis present

## 2014-09-24 DIAGNOSIS — R002 Palpitations: Secondary | ICD-10-CM | POA: Diagnosis present

## 2014-09-24 DIAGNOSIS — R9439 Abnormal result of other cardiovascular function study: Secondary | ICD-10-CM | POA: Clinically undetermined

## 2014-09-24 DIAGNOSIS — R931 Abnormal findings on diagnostic imaging of heart and coronary circulation: Secondary | ICD-10-CM

## 2014-09-24 DIAGNOSIS — R0789 Other chest pain: Secondary | ICD-10-CM | POA: Diagnosis not present

## 2014-09-24 DIAGNOSIS — R110 Nausea: Secondary | ICD-10-CM | POA: Diagnosis present

## 2014-09-24 DIAGNOSIS — I200 Unstable angina: Secondary | ICD-10-CM | POA: Clinically undetermined

## 2014-09-24 DIAGNOSIS — R079 Chest pain, unspecified: Secondary | ICD-10-CM

## 2014-09-24 HISTORY — PX: LEFT HEART CATHETERIZATION WITH CORONARY ANGIOGRAM: SHX5451

## 2014-09-24 SURGERY — LEFT HEART CATHETERIZATION WITH CORONARY ANGIOGRAM
Anesthesia: LOCAL

## 2014-09-24 MED ORDER — FENTANYL CITRATE 0.05 MG/ML IJ SOLN
INTRAMUSCULAR | Status: AC
  Filled 2014-09-24: qty 2

## 2014-09-24 MED ORDER — MIDAZOLAM HCL 2 MG/2ML IJ SOLN
INTRAMUSCULAR | Status: AC
  Filled 2014-09-24: qty 2

## 2014-09-24 MED ORDER — VERAPAMIL HCL 2.5 MG/ML IV SOLN
INTRAVENOUS | Status: AC
  Filled 2014-09-24: qty 2

## 2014-09-24 MED ORDER — HEPARIN SOD (PORK) LOCK FLUSH 100 UNIT/ML IV SOLN
500.0000 [IU] | INTRAVENOUS | Status: AC | PRN
  Administered 2014-09-24: 500 [IU]

## 2014-09-24 MED ORDER — HEPARIN (PORCINE) IN NACL 2-0.9 UNIT/ML-% IJ SOLN
INTRAMUSCULAR | Status: AC
  Filled 2014-09-24: qty 1000

## 2014-09-24 MED ORDER — HEPARIN SODIUM (PORCINE) 1000 UNIT/ML IJ SOLN
INTRAMUSCULAR | Status: AC
  Filled 2014-09-24: qty 1

## 2014-09-24 MED ORDER — NITROGLYCERIN 1 MG/10 ML FOR IR/CATH LAB
INTRA_ARTERIAL | Status: AC
  Filled 2014-09-24: qty 10

## 2014-09-24 MED ORDER — SODIUM CHLORIDE 0.9 % IV SOLN
1.0000 mL/kg/h | INTRAVENOUS | Status: AC
Start: 2014-09-24 — End: 2014-09-24

## 2014-09-24 MED ORDER — LIDOCAINE HCL (PF) 1 % IJ SOLN
INTRAMUSCULAR | Status: AC
  Filled 2014-09-24: qty 30

## 2014-09-24 NOTE — H&P (View-Only) (Signed)
Awaiting Nuclear stress results. Likely discharge if low risk.

## 2014-09-24 NOTE — Interval H&P Note (Signed)
History and Physical Interval Note:  09/24/2014 12:26 PM  [place]  has presented today for surgery, with the diagnosis of unstable angina with Abnormal Myoview Stress Test.    The various methods of treatment have been discussed with the patient and family. After consideration of risks, benefits and other options for treatment, the patient has consented to  Procedure(s): LEFT HEART CATHETERIZATION WITH CORONARY ANGIOGRAM (N/A) +/- PCI as a surgical intervention .  The patient's history has been reviewed, patient examined, no change in status, stable for surgery.  I have reviewed the patient's chart and labs.  Questions were answered to the patient's satisfaction.     HARDING,DAVID W  Cath Lab Visit (complete for each Cath Lab visit)  Clinical Evaluation Leading to the Procedure:   ACS: Yes.    Non-ACS:    Anginal Classification: CCS III  Anti-ischemic medical therapy: No Therapy  Non-Invasive Test Results: Intermediate-risk stress test findings: cardiac mortality 1-3%/year -- Intermediate risk study with small size, moderate severity reversible defect in mid LAD territory.   Prior CABG: No previous CABG   Leonie Man, M.D., M.S. Interventional Cardiologist   Pager # [phone]

## 2014-09-24 NOTE — H&P (View-Only) (Signed)
Patient Profile: 38 y/o female with h/o neurally mediated syncope (has a loop recorder but battery is non functional per recent interrogation), Chron's disease and family history of early CAD (father with MI at 58), admitted for evaluation of chest pain.   Subjective: No recurrent chest pain overnight. However, during [place] NST, patient reports substernal chest pressure, dyspnea, bilateral arm tingling and HA. Symptoms improved by test completion.   Objective: Vital signs in last 24 hours: Temp:  [98.1 F (36.7 C)-98.5 F (36.9 C)] 98.5 F (36.9 C) (10/01 0500) Pulse Rate:  [66-93] 73 (10/01 0500) Resp:  [11-23] 18 (10/01 0500) BP: (93-114)/(59-86) 105/67 mmHg (10/01 0500) SpO2:  [100 %] 100 % (10/01 0500) Weight:  [153 lb 12.8 oz (69.763 kg)-154 lb (69.854 kg)] 153 lb 12.8 oz (69.763 kg) (10/01 0500) Last BM Date: 09/22/14  Intake/Output from previous day:   Intake/Output this shift:    Medications Current Facility-Administered Medications  Medication Dose Route Frequency Provider Last Rate Last Dose  . acetaminophen (TYLENOL) tablet 650 mg  650 mg Oral Q4H PRN Romello Hoehn Erie Noe, PA-C      . albuterol (PROVENTIL) (2.5 MG/3ML) 0.083% nebulizer solution 2.5 mg  2.5 mg Nebulization Q6H PRN Evans Lance, MD      . ALPRAZolam Duanne Moron) tablet 0.5 mg  0.5 mg Oral Daily PRN Myrka Sylva Erie Noe, PA-C      . aspirin chewable tablet 324 mg  324 mg Oral NOW Jaxon Flatt Erie Noe, PA-C       Or  . aspirin suppository 300 mg  300 mg Rectal NOW Shannyn Jankowiak M Cara Thaxton, PA-C      . aspirin EC tablet 81 mg  81 mg Oral Daily Geneva Barrero M Taila Basinski, PA-C      . heparin injection 5,000 Units  5,000 Units Subcutaneous 3 times per day Consuelo Pandy, PA-C   5,000 Units at 09/23/14 0609  . loratadine (CLARITIN) tablet 10 mg  10 mg Oral Daily Glennie Rodda Erie Noe, PA-C      . morphine 2 MG/ML injection 1 mg  1 mg Intravenous Q4H PRN Valeska Haislip M Tamla Winkels, PA-C      . nitroGLYCERIN (NITROSTAT) SL  tablet 0.4 mg  0.4 mg Sublingual Q5 Min x 3 PRN Avalie Oconnor M Zethan Alfieri, PA-C      . ondansetron [facility]) injection 4 mg  4 mg Intravenous Q6H PRN Favour Aleshire M David Towson, PA-C      . pantoprazole (PROTONIX) EC tablet 40 mg  40 mg Oral Daily Tanyla Stege Erie Noe, PA-C   40 mg at 09/22/14 2225  . regadenoson (LEXISCAN) 0.4 MG/5ML injection SOLN           . regadenoson (LEXISCAN) injection SOLN 0.4 mg  0.4 mg Intravenous Once Aijalon Kirtz M Sonam Huelsmann, PA-C      . sertraline (ZOLOFT) tablet 50 mg  50 mg Oral QHS George Haggart M Taline Nass, PA-C   50 mg at 09/23/14 0103  . sodium chloride 0.9 % injection 10-40 mL  10-40 mL Intracatheter PRN Tanna Furry, MD   10 mL at 09/23/14 0405  . zolpidem (AMBIEN) tablet 5 mg  5 mg Oral QHS Idrissa Beville Erie Noe, PA-C   5 mg at 09/22/14 2225    PE: General appearance: alert, cooperative and no distress Lungs: clear to auscultation bilaterally Heart: regular rate and rhythm Extremities: no LEE Pulses: 2+ and symmetric Skin: warm and dry Neurologic: Grossly normal  Lab Results:   Recent Labs  09/22/14 1245 09/23/14 0405  WBC 10.3 7.4  HGB 12.0 11.7*  HCT 36.0 35.8*  PLT 321 314   BMET  Recent Labs  09/22/14 1245 09/23/14 0405  NA 141 142  K 3.8 3.7  CL 105 107  CO2 23 24  GLUCOSE 90 96  BUN 15 15  CREATININE 0.63 0.67  0.69  CALCIUM 8.6 8.0*   PT/INR  Recent Labs  09/23/14 0405  LABPROT 14.2  INR 1.10   Cholesterol  Recent Labs  09/23/14 0405  CHOL 186   Cardiac Panel (last 3 results)  Recent Labs  09/23/14 0405  TROPONINI <0.30     Assessment/Plan    Active Problems:   Chest pain  1. Chest pain: no recurrent symptoms overnight. Cardiac enzymes negative x 2. NST pending. If normal, can discharge home today.  2. Loop Recorder: battery is non functional. Will discuss recommendations with EP.   3. HLD: Fasting lipid panel revealed LDL is slightly elevated at 109. Triglycerides 165. This is a new diagnosis. If ischemic w/u is  negative, I feel that it would be reasonable to start with lifestyle modification, particularly adjustments through diet and exercise. She should f/u with her PCP in 3-6 months. If levels not improved then she may need medical therapy with a statin.      LOS: 1 day    Ura Yingling M. Rosita Fire, PA-C 09/23/2014 9:02 AM

## 2014-09-24 NOTE — Discharge Summary (Signed)
Discharge Summary   Patient ID: [name] MRN: [id number], DOB/AGE: 1976-10-29 38 y.o. Admit date: 09/22/2014 D/C date:     09/24/2014  Primary Cardiologist: Dr. Lovena Le  Principal Problem:   Chest pain with minimal risk for cardiac etiology; angiographically normal coronary arteries Active Problems:   GERD (gastroesophageal reflux disease)   Endometriosis   Migraine   HLD (hyperlipidemia)   Abnormal nuclear stress test   Crohn disease   Dysautonomia   Palpitations   Chronic nausea   Admission Dates: 09/22/14- 09/24/14 Discharge Diagnosis: Chest pain- non cardiac s/p false positive myovue and subsequent LHC with no evidence of CAD.   HPI: [name] is a 38 y.o. female with a history of neurally mediated syncope treated with Florinef in past, recurrent syncope, Crohn's disease and endometriosis who presented to [facility] on 09/22/14 with chest pain.  Her last Crohns flare was in August of this year. She has chronic port-a-cath that was placed over 1 year ago due to poor vascular access and frequent need for IV therapy for Crohn flares. She is not currently on an prophylactic therapy because she is nursing her newborn. Past cardiac workup has included a stress echo in 2010 that was negative for ischemia. LV function was normal. Trace MR was noted. She also has a loop recorder that was implanted by Dr. Lovena Le. She denies any prior history of hypertension, diabetes or hyperlipidemia. She does note a strong family history of early CAD noting that her father died from MI at age 2.  She reported that she was in her usual state of health until 3 days prior to admission when she experienced unexplained fatigue and generalized malaise. She then developed sudden onset of left-sided chest pain while she was at work [employer]). The pain was pressure-like and radiated across her entire chest. She noted associated dyspnea, nausea without vomiting, dizziness and near syncope. EKG demonstrated borderline  T wave abnormalities in the anterior leads. CXR was unremarkable. Initial troponin was negative. It was decided to admit her for further observation and stress test the following AM.   Hospital Course  Chest pain- thought to be both typical and atypical  -- Troponin neg x3. ECG with no acute ST or TW changes -- She underwent nuclear stress test on 09/23/14 which returned as intermediate risk with a small size, moderate severity, reversible defect in mid LAD territory. Possible artifact. ( see full report below). Although it was thought highly unlikely that she have CAD, it was thought best to rule it out completely with coronary angiography.  -- Underwent LHC on 09/24/14 which revealed:  Angiographically normal coronary arteries with a right dominant system.  No evidence of any culprit lesion to explain the abnormal Myoview. This would be considered a false positive Myoview stress test likely related to breast attenuation.  Normal LV function with normal LVEDP.  Recurrent syncope- She has a Loop recorder in place, battery is nonfunctional. She has been followed by Dr. Lovena Le for near syncope but no visits in last 2 years. Will need to discuss with Dr. Lovena Le regarding her Loop recorder. -- She still reports weekly episodes of near syncope.  -- Will have her follow up in the clinic as soon as possible. She will see Tonny Bollman PA-C on a day that Dr. Lovena Le is in the office.  HLD: Fasting lipid panel revealed LDL is slightly elevated at 109. TG 165.  -- This is a new diagnosis. -- Recommeneded lifestyle modification with diet and  exercise.  -- She should f/u with her PCP in 3-6 months. If levels not improved then she may need medical therapy with a statin.    The patient has had an uncomplicated hospital course and is recovering well. The radial catheter site is stable. She has been seen by Dr. Tamala Julian today and deemed ready for discharge home. All follow-up appointments have been scheduled.  Discharge medications are listed below.    Discharge Vitals: Blood pressure 112/62, pulse 70, temperature 98.1 F (36.7 C), temperature source Oral, resp. rate 14, height _0  (1.626 m), weight 153 lb 6.4 oz (69.582 kg), last menstrual period 09/16/2014, SpO2 100.00%.  Labs: Lab Results  Component Value Date   WBC 7.7 09/23/2014   HGB 11.2* 09/23/2014   HCT 33.8* 09/23/2014   MCV 84.9 09/23/2014   PLT 304 09/23/2014    Recent Labs Lab 09/22/14 1245  09/23/14 2242  NA 141  < > 140  K 3.8  < > 3.5*  CL 105  < > 104  CO2 23  < > 26  BUN 15  < > 15  CREATININE 0.63  < > 0.69  CALCIUM 8.6  < > 8.3*  PROT 6.7  --   --   BILITOT <0.2*  --   --   ALKPHOS 92  --   --   ALT 13  --   --   AST 15  --   --   GLUCOSE 90  < > 133*  < > = values in this interval not displayed.  Recent Labs  09/23/14 0405 09/23/14 0913 09/23/14 1617  TROPONINI <0.30 <0.30 <0.30   Lab Results  Component Value Date   CHOL 186 09/23/2014   HDL 44 09/23/2014   LDLCALC 109* 09/23/2014   TRIG 165* 09/23/2014     Diagnostic Studies/Procedures   Dg Chest 2 View  09/22/2014   CLINICAL DATA:  Chest pain, dizziness, history Crohn's disease, asthma, endometriosis, GERD  EXAM: CHEST  2 VIEW  COMPARISON:  08/24/2014  FINDINGS: RIGHT jugular Port-A-Cath with tip projecting at [place].  Loop recorder projects over LEFT chest.  BILATERAL nipple shadows.  Normal heart size, mediastinal contours, and pulmonary vascularity.  Lungs clear.  No pleural effusion or pneumothorax.  Bones unremarkable.  IMPRESSION: No acute abnormalities.   Electronically Signed   By: Lavonia Dana M.D.   On: 09/22/2014 12:11   Nm Myocar Multi W/spect W/wall Motion / Ef  09/23/2014   CLINICAL DATA:  Chest pain, Syncope  EXAM: Lexiscan Myovue  TECHNIQUE: The patient received IV Lexiscan .18m over 15 seconds. 33.0 mCi of Technetium [id number] injected at 30 seconds. Quantitative SPECT images were obtained in the vertical, horizontal and short axis  planes after a 45 minute delay. Rest images were obtained with similar planes and delay using 10.2 mCi of Technetium [id number].  FINDINGS: ECG: Resting: Ectopic atrial rhythm, nonspecific ST-T wave abnormalities, At stress: Unchanged  Symptoms:  Mild chest pain  RAW Data: Significant subdiaphragmatic uptake. There is also uptake in the port-a-cath in the the right upper chest.  Quantitative Gated SPECT EF: LVEF > 70 %, no regional wall motion abnormalities. TID 1.25.  Perfusion Images: There is decreased uptake in the mid and apical anterior wall on the stress images. Normal resting uptake. (SDS 4)  IMPRESSION: 1. Intermediate risk study with small size, moderate severity reversible defect in mid LAD territory. The interpretation of this study might be affected by significant extracardiac activity, however the presence  of transient ischemic dilatation increases the suspicion for possible ischemia.  2.  Hyperdynamic LVEF > 70%, no regional wall motion abnormalities.    CARDIAC CATHETERIZATION REPORT  NAME: [name] MRN: [id number]  DOB: 1976-09-13 ADMIT DATE: 09/22/2014  Procedure Date: 09/24/2014  INTERVENTIONAL CARDIOLOGIST: Leonie Man, M.D., MS  PRIMARY CARE PROVIDER: Eliezer Lofts, MD  PRIMARY CARDIOLOGIST: Cristopher Peru, MD  PATIENT: [name] is a 38 y.o. female followed by Dr. Cristopher Peru for appears to be neurally mediated syncope. Crohn's disease and endometriosis.  She had been in her usual state of health until about September 27 posterior started noticing fatigue and generalized weakness. She then developed sudden onset left-sided chest pain on the evening of September 30. This is associated with a pressure-like sensation that radiated across her entire chest with dyspnea and nausea but no vomiting, syncope or near syncope. She presented to [facility] for evaluation and underwent nuclear stress test yesterday (09/23/2014) that suggested an intermediate risk study with  possible anterior ischemia. There is question of this being potentially breast attenuation versus true ischemia. She is therefore referred for invasive evaluation with cardiac catheterization. The diagnosis prior to the procedure with an abnormal stress test is considered to be unstable angina.  PRE-OPERATIVE DIAGNOSIS:  Chest pain concerning for Unstable Angina  Intermediate Risk Nuclear Stress Test with Anterior Ischemia PROCEDURES PERFORMED:  Left Heart Catheterization with Native Coronary Angiography via Right Radial Artery  Left Ventriculography PROCEDURE: The patient was brought to the [facility]nd [facility] [facility] Lab in the fasting state and prepped and draped in the usual sterile fashion for Right Radial artery access. A modified Allen's test was performed on the right wrist demonstrating excellent collateral flow for radial access. Sterile technique was used including antiseptics, cap, gloves, gown, hand hygiene, mask and sheet. Skin prep: Chlorhexidine.  Consent: Risks of procedure as well as the alternatives and risks of each were explained to the (patient/caregiver). Consent for procedure obtained.  Time Out: Verified patient identification, verified procedure, site/side was marked, verified correct patient position, special equipment/implants available, medications/allergies/relevent history reviewed, required imaging and test results available. Performed.  Access:  Right Radial Artery: 6 Fr Sheath - Seldinger Technique (Angiocath Micropuncture Kit)  Radial Cocktail - 10 mL; IV Heparin 3500 Units  Left Heart Catheterization: 5Fr Catheters advanced or exchanged over a Long Exchange Safety J-wire; TIG 4.0 catheter advanced first.  Left & Right Coronary Artery Cineangiography: TIG 4.0 Catheter  LV Hemodynamics (LV Gram): TIG 4.0 TR Band: 1300 Hours; 13 mL air  FINDINGS:  Hemodynamics:  Central Aortic Pressure / Mean: 94/68/80 mmHg  Left Ventricular Pressure / LVEDP:  100/65/65 mmHg Left Ventriculography:  EF: 55-60% %  Wall Motion: Normal Coronary Anatomy: angiographically normal  Dominance: Right Left Main: Normal caliber, long vessel that bifurcates into the LAD and Circumflex LAD: Normal caliber vessel that gives off a proximal septal perforator. It then bifurcates into 2 equal-sized diagonal and the follow-on LAD. The LAD then tapers down and around the apex.  D1: Moderate caliber vessel is the same size as the LAD the covers the anterolateral wall. Left Circumflex: Normal (moderate caliber) caliber vessel it gives off a very high ramus like OM1 and courses in the AV groove. Small posterolateral branch.  OM1: Moderate caliber vessel that gives off a small proximal branch. It courses as a ramus intermedius with no significant disease. RCA: Large caliber dominant vessel with 2 major artery marginal branches. The second one actually courses  distally at the tandem PDA. The RCA gives off a large Right Posterior AV Groove Branch (RPAV) that bifurcates into 2 posterolateral branches. The L1 enlarged. There is also a small caliber PDA that reaches about half way to the apex. After reviewing the initial angiography, no culprit lesion identified.  MEDICATIONS:  Anesthesia: Local Lidocaine 2 ml Sedation: 3 mg IV Versed, 25 mcg IV fentanyl ;  Omnipaque Contrast: 60 ml  Anticoagulation: IV Heparin 3500 Units ;  Radial Cocktail: 5 mg Verapamil, 400 mcg NTG, 2 ml 2% Lidocaine in 10 ml NS PATIENT DISPOSITION:  The patient was transferred to the PACU holding area in a hemodynamicaly stable, chest pain free condition.  The patient tolerated the procedure well, and there were no complications. EBL: < 10 ml  The patient was stable before, during, and after the procedure. POST-OPERATIVE DIAGNOSIS:  Angiographically normal coronary arteries with a right dominant system.  No evidence of any culprit lesion to explain the abnormal Myoview. This would be considered a false  positive Myoview stress test likely related to breast attenuation.  Normal LV function with normal LVEDP. PLAN OF CARE:  Patient will return to her nursing unit after radial sheath removal she should be potentially ready for discharge for noncardiac chest pain.  Would not pursue additional ischemic evaluation. Sulfa reported to the patient's primary service. I will forward this note to her PCP and cardiologist.    Discharge Medications     Medication List    TAKE these medications       albuterol 108 (90 BASE) MCG/ACT inhaler  Commonly known as:  PROVENTIL HFA;VENTOLIN HFA  Inhale 2 puffs into the lungs every 6 (six) hours as needed for wheezing or shortness of breath.     ALPRAZolam 0.25 MG tablet  Commonly known as:  XANAX  Take 0.5 mg by mouth daily as needed for anxiety.     butalbital-acetaminophen-caffeine 50-325-40 MG per tablet  Commonly known as:  FIORICET, ESGIC  Take 2 tablets by mouth 2 (two) times daily as needed for headache.     cetirizine 10 MG tablet  Commonly known as:  ZYRTEC  Take 10 mg by mouth every evening.     dexlansoprazole 60 MG capsule  Commonly known as:  DEXILANT  Take 60 mg by mouth every evening.     HYDROmorphone 2 MG tablet  Commonly known as:  DILAUDID  Take 2 mg by mouth every 6 (six) hours as needed for severe pain (pain).     naratriptan 2.5 MG tablet  Commonly known as:  AMERGE  Take 1 tablet (2.5 mg total) by mouth as needed for migraine. Take one (1) tablet at onset of headache; if returns or does not resolve, may repeat after 4 hours; do not exceed five (5) mg in 24 hours.     omeprazole 20 MG capsule  Commonly known as:  PRILOSEC  Take 20 mg by mouth 2 (two) times daily as needed (acid reflux).     ondansetron 8 MG disintegrating tablet  Commonly known as:  ZOFRAN-ODT  Take 8 mg by mouth daily.     sertraline 50 MG tablet  Commonly known as:  ZOLOFT  Take 50 mg by mouth at bedtime.     VITAMIN B1-B12 IJ  Inject 1.5  mLs as directed every 14 (fourteen) days.     zolpidem 10 MG tablet  Commonly known as:  AMBIEN  Take 10 mg by mouth at bedtime.      ASK your  doctor about these medications       PRENATAL PO  Take 1 tablet by mouth every evening.        Disposition   The patient will be discharged in stable condition to home.  Follow-up Information   Follow up with Eliezer Lofts, MD. (About your cholesterol)    Specialty:  Family Medicine   Contact information:   [address] [place] [address] [phone]       Follow up with Richardson Dopp, PA-C On 10/13/2014. (@ 2:20pm. Dr Lovena Le will be in the office to talk about what to do about your recurrent passing out and loop recorder with dead batteries. )    Specialty:  Physician Assistant   Contact information:   [address] [address] [phone]         Duration of Discharge Encounter: Greater than 30 minutes including physician and PA time.  Mable Fill R PA-C 09/24/2014, 4:49 PM

## 2014-09-24 NOTE — Discharge Summary (Signed)
Highly unlikely to have CAD in my opinion and nuclear study is likely confounding. Plan discharge after cath unless unexpected disease requiring intervention is found. The suspicion was confirmed a catheterization.  The patient is discharged home and will followup with Dr. Crissie Sickles concerning possible extraction of her loop recorder.  The dictated note is accurate and was crafted under my supervision.

## 2014-09-24 NOTE — CV Procedure (Addendum)
CARDIAC CATHETERIZATION REPORT  NAME:  [name]   MRN: [id number] DOB:  12/31/1975   ADMIT DATE: 09/22/2014 Procedure Date: 09/24/2014  INTERVENTIONAL CARDIOLOGIST: Leonie Man, M.D., MS PRIMARY CARE PROVIDER: Eliezer Lofts, MD PRIMARY CARDIOLOGIST: Cristopher Peru, MD  PATIENT:  [name] is a 38 y.o. female followed by Dr. Cristopher Peru for appears to be neurally mediated syncope. Crohn's disease and endometriosis. She had been in her usual state of health until about September 27 posterior started noticing fatigue and generalized weakness. She then developed sudden onset left-sided chest pain on the evening of September 30. This is associated with a pressure-like sensation that radiated across her entire chest with dyspnea and nausea but no vomiting, syncope or near syncope.  She presented to [facility] for evaluation and underwent nuclear stress test yesterday (09/23/2014) that suggested an intermediate risk study with possible anterior ischemia. There is question of this being potentially breast attenuation versus true ischemia. She is therefore referred for invasive evaluation with cardiac catheterization. The diagnosis prior to the procedure with an abnormal stress test is considered to be unstable angina.  PRE-OPERATIVE DIAGNOSIS:    Chest pain concerning for Unstable Angina  Intermediate Risk Nuclear Stress Test with Anterior Ischemia  PROCEDURES PERFORMED:    Left Heart Catheterization with Native Coronary Angiography  via  Right Radial Artery   Left Ventriculography  PROCEDURE: The patient was brought to the [facility] in the fasting state and prepped and draped in the usual sterile fashion for  Right Radial artery access. A modified Allen's test was performed on the  right wrist demonstrating excellent collateral flow for radial access.   Sterile technique was used including antiseptics, cap, gloves, gown, hand hygiene, mask  and sheet. Skin prep: Chlorhexidine.   Consent: Risks of procedure as well as the alternatives and risks of each were explained to the (patient/caregiver). Consent for procedure obtained.   Time Out: Verified patient identification, verified procedure, site/side was marked, verified correct patient position, special equipment/implants available, medications/allergies/relevent history reviewed, required imaging and test results available. Performed.  Access:    Right Radial Artery:  6 Fr Sheath -  Seldinger Technique (Angiocath Micropuncture Kit)  Radial Cocktail - 10 mL; IV Heparin  3500 Units   Left Heart Catheterization:  5Fr Catheters advanced or exchanged over a  Long Exchange Safety J-wire; TIG 4.0 catheter advanced first.  Left & Right Coronary Artery Cineangiography: TIG 4.0 Catheter   LV Hemodynamics (LV Gram): TIG 4.0  TR Band: 1300  Hours; 13 mL air  FINDINGS:  Hemodynamics:   Central Aortic Pressure / Mean: 94/68/80 mmHg  Left Ventricular Pressure / LVEDP: 100/65/65 mmHg  Left Ventriculography:  EF:  55-60% %  Wall Motion:  Normal  Coronary Anatomy: angiographically normal  Dominance:  Right  Left Main:  Normal caliber, long vessel that bifurcates into the LAD and Circumflex LAD:  Normal caliber vessel that gives off a proximal septal perforator. It then bifurcates into 2 equal-sized diagonal and the follow-on LAD. The LAD then tapers down and around the apex.  D1:  Moderate caliber vessel is the same size as the LAD the covers the anterolateral wall.  Left Circumflex:  Normal (moderate caliber) caliber vessel it gives off a very high ramus like OM1 and courses in the AV groove. Small posterolateral branch.  OM1:  Moderate caliber vessel that gives off a small proximal branch. It courses as a ramus intermedius with no significant  disease.   RCA:  Large caliber dominant vessel with 2 major artery marginal branches. The second one actually courses distally at  the tandem PDA. The RCA gives off a large Right Posterior AV Groove Branch (RPAV) that bifurcates into 2 posterolateral branches. The L1 enlarged. There is also a small caliber PDA that reaches about half way to the apex.  After reviewing the initial angiography, no culprit lesion  identified.  MEDICATIONS:  Anesthesia:  Local Lidocaine  2 ml  Sedation:  3 mg IV Versed,  25 mcg IV fentanyl ;   Omnipaque Contrast:  60 ml  Anticoagulation:  IV Heparin  3500 Units ;  Radial Cocktail: 5 mg Verapamil, 400 mcg NTG, 2 ml 2% Lidocaine in 10 ml NS  PATIENT DISPOSITION:    The patient was transferred to the PACU holding area in a hemodynamicaly stable, chest pain free condition.  The patient tolerated the procedure well, and there were no complications.  EBL:   < 10 ml  The patient was stable before, during, and after the procedure.  POST-OPERATIVE DIAGNOSIS:    Angiographically normal coronary arteries with a right dominant system.  No evidence of any culprit lesion to explain the abnormal Myoview. This would be considered a false positive Myoview stress test likely related to breast attenuation.  Normal LV function with normal LVEDP.  PLAN OF CARE:  Patient will return to her nursing unit after radial sheath removal she should be potentially ready for discharge for noncardiac chest pain.  Would not pursue additional ischemic evaluation.  Sulfa reported to the patient's primary service. I will forward this note to her PCP and cardiologist.  Leonie Man, M.D., M.S. Interventional Cardiologist   Pager # [phone]

## 2014-09-24 NOTE — Progress Notes (Signed)
Highly unlikely to have CAD in my opinion and nuclear study is likely confounding. Plan discharge after cath unless unexpected disease requiring intervention is found.

## 2014-09-24 NOTE — Discharge Instructions (Signed)
Chest Pain (Nonspecific) It is often hard to give a specific diagnosis for the cause of chest pain. There is always a chance that your pain could be related to something serious, such as a heart attack or a blood clot in the lungs. You need to follow up with your health care provider for further evaluation. CAUSES   Heartburn.  Pneumonia or bronchitis.  Anxiety or stress.  Inflammation around your heart (pericarditis) or lung (pleuritis or pleurisy).  A blood clot in the lung.  A collapsed lung (pneumothorax). It can develop suddenly on its own (spontaneous pneumothorax) or from trauma to the chest.  Shingles infection (herpes zoster virus). The chest wall is composed of bones, muscles, and cartilage. Any of these can be the source of the pain.  The bones can be bruised by injury.  The muscles or cartilage can be strained by coughing or overwork.  The cartilage can be affected by inflammation and become sore (costochondritis). DIAGNOSIS  Lab tests or other studies may be needed to find the cause of your pain. Your health care provider may have you take a test called an ambulatory electrocardiogram (ECG). An ECG records your heartbeat patterns over a 24-hour period. You may also have other tests, such as:  Transthoracic echocardiogram (TTE). During echocardiography, sound waves are used to evaluate how blood flows through your heart.  Transesophageal echocardiogram (TEE).  Cardiac monitoring. This allows your health care provider to monitor your heart rate and rhythm in real time.  Holter monitor. This is a portable device that records your heartbeat and can help diagnose heart arrhythmias. It allows your health care provider to track your heart activity for several days, if needed.  Stress tests by exercise or by giving medicine that makes the heart beat faster. TREATMENT   Treatment depends on what may be causing your chest pain. Treatment may include:  Acid blockers for  heartburn.  Anti-inflammatory medicine.  Pain medicine for inflammatory conditions.  Antibiotics if an infection is present.  You may be advised to change lifestyle habits. This includes stopping smoking and avoiding alcohol, caffeine, and chocolate.  You may be advised to keep your head raised (elevated) when sleeping. This reduces the chance of acid going backward from your stomach into your esophagus. Most of the time, nonspecific chest pain will improve within 2-3 days with rest and mild pain medicine.  HOME CARE INSTRUCTIONS   If antibiotics were prescribed, take them as directed. Finish them even if you start to feel better.  For the next few days, avoid physical activities that bring on chest pain. Continue physical activities as directed.  Do not use any tobacco products, including cigarettes, chewing tobacco, or electronic cigarettes.  Avoid drinking alcohol.  Only take medicine as directed by your health care provider.  Follow your health care provider's suggestions for further testing if your chest pain does not go away.  Keep any follow-up appointments you made. If you do not go to an appointment, you could develop lasting (chronic) problems with pain. If there is any problem keeping an appointment, call to reschedule. SEEK MEDICAL CARE IF:   Your chest pain does not go away, even after treatment.  You have a rash with blisters on your chest.  You have a fever. SEEK IMMEDIATE MEDICAL CARE IF:   You have increased chest pain or pain that spreads to your arm, neck, jaw, back, or abdomen.  You have shortness of breath.  You have an increasing cough, or you cough  up blood.  You have severe back or abdominal pain.  You feel nauseous or vomit.  You have severe weakness.  You faint.  You have chills. This is an emergency. Do not wait to see if the pain will go away. Get medical help at once. Call your local emergency services (911 in [place] Do not drive  yourself to the hospital. MAKE SURE YOU:   Understand these instructions.  Will watch your condition.  Will get help right away if you are not doing well or get worse. Document Released: 09/19/2005 Document Revised: 12/15/2013 Document Reviewed: 07/15/2008 [facility] Patient Information 2015 [place], [place]. This information is not intended to replace advice given to you by your health care provider. Make sure you discuss any questions you have with your health care provider.                                                                                                Radial Site Care Refer to this sheet in the next few weeks. These instructions provide you with information on caring for yourself after your procedure. Your caregiver may also give you more specific instructions. Your treatment has been planned according to current medical practices, but problems sometimes occur. Call your caregiver if you have any problems or questions after your procedure. HOME CARE INSTRUCTIONS  You may shower the day after the procedure.Remove the bandage (dressing) and gently wash the site with plain soap and water.Gently pat the site dry.   Do not apply powder or lotion to the site.   Do not submerge the affected site in water for 3 to 5 days.   Inspect the site at least twice daily.   Do not flex or bend the affected arm for 24 hours.   No lifting over 5 pounds (2.3 kg) for 5 days after your procedure.   Do not drive home if you are discharged the same day of the procedure. Have someone else drive you.   You may drive 24 hours after the procedure unless otherwise instructed by your caregiver.  What to expect:  Any bruising will usually fade within 1 to 2 weeks.   Blood that collects in the tissue (hematoma) may be painful to the touch. It should usually decrease in size and tenderness within 1 to 2 weeks.  SEEK IMMEDIATE MEDICAL CARE IF:  You have unusual pain at the radial site.   You  have redness, warmth, swelling, or pain at the radial site.   You have drainage (other than a small amount of blood on the dressing).   You have chills.   You have a fever or persistent symptoms for more than 72 hours.   You have a fever and your symptoms suddenly get worse.   Your arm becomes pale, cool, tingly, or numb.   You have heavy bleeding from the site. Hold pressure on the site.

## 2014-09-27 ENCOUNTER — Other Ambulatory Visit: Payer: Self-pay | Admitting: *Deleted

## 2014-09-27 MED ORDER — ALPRAZOLAM 0.25 MG PO TABS: 0.5000 mg | ORAL_TABLET | Freq: Every day | ORAL | Status: DC | PRN

## 2014-09-27 NOTE — Telephone Encounter (Signed)
Last office visit 08/20/2014.  Ok to refill?

## 2014-09-28 NOTE — Telephone Encounter (Signed)
Called to [facility].

## 2014-09-30 NOTE — ED Provider Notes (Signed)
Medical screening examination/treatment/procedure(s) were performed by non-physician practitioner and as supervising physician I was immediately available for consultation/collaboration.   EKG Interpretation   Date/Time:  Wednesday September 22 2014 11:11:42 EDT Ventricular Rate:  83 PR Interval:  153 QRS Duration: 81 QT Interval:  409 QTC Calculation: 481 R Axis:   85 Text Interpretation:  Ectopic atrial rhythm Borderline T abnormalities,  anterior leads ED PHYSICIAN INTERPRETATION AVAILABLE IN [facility]  Confirmed by TEST, Record ([id number]) on 09/24/2014 9:39:11 AM        Tanna Furry, MD 09/30/14 [id number]

## 2014-10-13 ENCOUNTER — Encounter: Payer: Self-pay | Admitting: Physician Assistant

## 2014-10-13 ENCOUNTER — Encounter: Payer: Self-pay | Admitting: *Deleted

## 2014-10-13 ENCOUNTER — Ambulatory Visit (INDEPENDENT_AMBULATORY_CARE_PROVIDER_SITE_OTHER): Payer: BC Managed Care – PPO | Admitting: Physician Assistant

## 2014-10-13 ENCOUNTER — Encounter (INDEPENDENT_AMBULATORY_CARE_PROVIDER_SITE_OTHER): Payer: BC Managed Care – PPO

## 2014-10-13 VITALS — BP 98/80 | HR 97 | Ht 64.0 in | Wt 146.4 lb

## 2014-10-13 DIAGNOSIS — R55 Syncope and collapse: Secondary | ICD-10-CM

## 2014-10-13 DIAGNOSIS — R000 Tachycardia, unspecified: Secondary | ICD-10-CM

## 2014-10-13 DIAGNOSIS — R072 Precordial pain: Secondary | ICD-10-CM

## 2014-10-13 NOTE — Progress Notes (Signed)
Cardiology Office Note   Date:  10/13/2014   ID:  [name], DOB 08/26/1976, MRN [id number]  PCP:  Eliezer Lofts, MD  Cardiologist:  Dr. Cristopher Peru     History of Present Illness: [name] is a 38 y.o. female with a hx of neurally mediated syncope controlled on Florinef in the past, Crohn's disease.  She is s/p ILR for recurrent syncope.  Admitted 9/30-10/2 with chest pain.  She ruled out for MI.  Inpatient nuclear stress test was abnormal with reversible defect in the LAD territory.  LHC demonstrated normal coronary arteries.    She tells me that her presenting symptom was chest discomfort in the setting of tachycardia.  She was told by EMS that her HR was 200.  I cannot locate any ECGs or Tele strips with this information.  She was also told that her ILR was demonstrating episodes of VT.  Since DC she continues to note episodes of syncope.  These episodes occur while standing for prolonged periods of time.  She notes very high HRs.  She then feels that her HR slows.  She typically passes out after her HR slows.  She does note a prodrome of dizziness.  She can improve her symptoms with lying down.  She usually feels bad for 1-2 days afterward (fatigue). She experiences CP with tachycardia.  Otherwise, she denies chest pain, dyspnea, orthopnea, PND, edema.   Studies:  - LHC (10/15):  EF 55-60%, normal coronary arteries  - ETT-Echo (11/10):  normal   Recent Labs/Images:  Recent Labs  11/27/13 0523  09/22/14 1245 09/23/14 0405 09/23/14 2242  NA  --   < > 141 142 140  K  --   < > 3.8 3.7 3.5*  BUN  --   < > 15 15 15   CREATININE  --   < > 0.63 0.67  0.69 0.69  ALT  --   --  13  --   --   HGB  --   < > 12.0 11.7* 11.2*  TSH 0.429  --   --   --   --   LDLCALC  --   --   --  109*  --   HDL  --   --   --  44  --   < > = values in this interval not displayed.    Nm Myocar Multi W/spect W/wall Motion / Ef    09/23/2014     IMPRESSION: 1. Intermediate risk study with small  size, moderate severity reversible defect in mid LAD territory. The interpretation of this study might be affected by significant extracardiac activity, however the presence of transient ischemic dilatation increases the suspicion for possible ischemia.  2.  Hyperdynamic LVEF > 70%, no regional wall motion abnormalities.  [name]   Electronically Signed   By: [name]   On: 09/23/2014 17:00     Wt Readings from Last 3 Encounters:  10/13/14 146 lb 6.4 oz (66.407 kg)  09/24/14 153 lb 6.4 oz (69.582 kg)  09/24/14 153 lb 6.4 oz (69.582 kg)     Past Medical History  Diagnosis Date  . Crohn disease 2000  . Asthma   . Endometriosis   . Dysautonomia     recurrent syncope s/p ILR by Dr Doreatha Lew  . Palpitations   . Anxiety   . GERD (gastroesophageal reflux disease)   . Vertigo   . Anginal pain only on 09/22/2014  . H/O hiatal hernia   .  Chest pain     a. s/p intermediate risk nuclear stress test on 09/23/14 and normal LHC on 09/24/14   . Chronic nausea   . Migraine     Current Outpatient Prescriptions  Medication Sig Dispense Refill  . albuterol (PROVENTIL HFA;VENTOLIN HFA) 108 (90 BASE) MCG/ACT inhaler Inhale 2 puffs into the lungs every 6 (six) hours as needed for wheezing or shortness of breath.  1 Inhaler  2  . ALPRAZolam (XANAX) 0.25 MG tablet Take 2 tablets (0.5 mg total) by mouth daily as needed for anxiety.  30 tablet  0  . butalbital-acetaminophen-caffeine (FIORICET, ESGIC) 50-325-40 MG per tablet Take 2 tablets by mouth 2 (two) times daily as needed for headache.      . cetirizine (ZYRTEC) 10 MG tablet Take 10 mg by mouth every evening.       Marland Kitchen dexlansoprazole (DEXILANT) 60 MG capsule Take 60 mg by mouth every evening.       Marland Kitchen HYDROmorphone (DILAUDID) 2 MG tablet Take 2 mg by mouth every 6 (six) hours as needed for severe pain (pain).      . naratriptan (AMERGE) 2.5 MG tablet Take 1 tablet (2.5 mg total) by mouth as needed for migraine. Take one (1) tablet at onset of  headache; if returns or does not resolve, may repeat after 4 hours; do not exceed five (5) mg in 24 hours.  15 tablet  6  . Prenatal Vit-Fe Fumarate-FA (PRENATAL PO) Take 1 tablet by mouth every evening.       . sertraline (ZOLOFT) 50 MG tablet Take 50 mg by mouth at bedtime.       Marland Kitchen VITAMIN B1-B12 IJ Inject 1.5 mLs as directed every 14 (fourteen) days.      Marland Kitchen zolpidem (AMBIEN) 10 MG tablet Take 10 mg by mouth at bedtime.       No current facility-administered medications for this visit.     Allergies:   Compazine; Reglan; Sulfonamide derivatives; Clarithromycin; Sulfamethoxazole; Biaxin; Codeine; Hydrocodone; Macrobid; Phenergan; Promethazine; Promethazine hcl; and Sulfa antibiotics   Social History:  The patient  reports that she has never smoked. She has never used smokeless tobacco. She reports that she drinks alcohol. She reports that she does not use illicit drugs.   Family History:  The patient's family history includes Arthritis in her mother; Coronary artery disease in her father; Diabetes in her father; Heart disease (age of onset: 66) in her father; Hyperlipidemia in her brother and mother; Hypertension in her brother and mother.   ROS:  Please see the history of present illness.      All other systems reviewed and negative.    PHYSICAL EXAM: VS:  BP 98/80  Pulse 97  Ht 5' 4"  (1.626 m)  Wt 146 lb 6.4 oz (66.407 kg)  BMI 25.12 kg/m2  LMP 09/16/2014  Orthostatic VS for the past 24 hrs:  BP- Lying Pulse- Lying BP- Sitting Pulse- Sitting BP- Standing at 0 minutes Pulse- Standing at 0 minutes  10/13/14 1652 130/81 mmHg 93 116/82 mmHg 91 117/83 mmHg 90     Orthostatic Vital Signs:    BP  HR  Lying:  130/81  93 Sitting:  116/82  91 Standing:  117/83  90 2 minutes: 121/76  56   Well nourished, well developed, in no acute distress HEENT: normal Neck: no JVD Cardiac:  normal S1, S2; RRR; no murmur Lungs:  clear to auscultation bilaterally, no wheezing, rhonchi or  rales Abd: soft, nontender, no hepatomegaly Ext: right  wrist without hematoma or mass; no edema Skin: warm and dry Neuro:  CNs 2-12 intact, no focal abnormalities noted  EKG:  NSR, HR 97, normal axis, no ST changes.      ASSESSMENT AND PLAN:  Syncope, unspecified syncope type -  ILR interrogated today.  Several high HR episodes were recorded prior to the battery dying.  The last was in 8/14.  The ILR recorded this as VT. However, I reviewed this with Dr. Cristopher Peru today.  This does not appear to be VT.  It looks more like some type of SVT.  She may be having post termination pauses or activation of her postural hypotension in the setting of SVT leading to her syncopal episodes.  Unfortunately, her ILR is no longer recording.      -  Arrange Event Monitor.    -  FU with Dr. Cristopher Peru.  Tachycardia -  Obtain event monitor.  She can discuss with Dr. Cristopher Peru whether or not to remove the ILR at FU.  Precordial pain -   This is assoc with her palpitations.  Cardiac cath was normal. No further ischemic workup necessary.   Disposition:   FU with Dr. Cristopher Peru 4-6 weeks.    Signed, Versie Starks, MHS 10/13/2014 3:18 PM    [facility] [address], [place], [place]  [address] Phone: [phone]; Fax: [phone]

## 2014-10-13 NOTE — Progress Notes (Signed)
Patient ID: [name], female   DOB: 02-Jan-1976, 38 y.o.   MRN: [id number] [employer] 30 day cardiac event monitor applied to patient.

## 2014-10-13 NOTE — Patient Instructions (Signed)
Your physician has recommended that you wear an event monitor. Event monitors are medical devices that record the heart's electrical activity. Doctors most often [name] these monitors to diagnose arrhythmias. Arrhythmias are problems with the speed or rhythm of the heartbeat. The monitor is a small, portable device. You can wear one while you do your normal daily activities. This is usually used to diagnose what is causing palpitations/syncope (passing out).  Your physician recommends that you schedule a follow-up appointment in: 4-6 [place] PER DR. Lovena Le ONCE YOUR EVENT MONITOR HAS BEEN COMPLETED  NO CHANGES WERE MADE WITH MEDICATIONS TODAY

## 2014-10-18 ENCOUNTER — Other Ambulatory Visit: Payer: Self-pay | Admitting: Family Medicine

## 2014-10-18 NOTE — Telephone Encounter (Signed)
Last office visit 08/20/2014.  Last refilled 09/27/2014 for #30 with no refills.  Ok to refill?

## 2014-10-19 NOTE — Telephone Encounter (Signed)
Called to [facility].

## 2014-10-21 ENCOUNTER — Other Ambulatory Visit: Payer: Self-pay | Admitting: Family Medicine

## 2014-10-21 NOTE — Telephone Encounter (Signed)
Last office visit 08/20/2014.  Ok to refill?

## 2014-10-22 ENCOUNTER — Other Ambulatory Visit: Payer: Self-pay | Admitting: Family Medicine

## 2014-10-22 NOTE — Telephone Encounter (Signed)
Called to [facility].

## 2014-10-24 ENCOUNTER — Other Ambulatory Visit: Payer: Self-pay | Admitting: Family Medicine

## 2014-10-25 ENCOUNTER — Encounter: Payer: Self-pay | Admitting: Physician Assistant

## 2014-10-25 ENCOUNTER — Ambulatory Visit (INDEPENDENT_AMBULATORY_CARE_PROVIDER_SITE_OTHER): Payer: BC Managed Care – PPO | Admitting: Family Medicine

## 2014-10-25 VITALS — BP 124/82 | HR 92 | Temp 98.2°F | Resp 14 | Wt 147.8 lb

## 2014-10-25 DIAGNOSIS — J0100 Acute maxillary sinusitis, unspecified: Secondary | ICD-10-CM

## 2014-10-25 DIAGNOSIS — K5090 Crohn's disease, unspecified, without complications: Secondary | ICD-10-CM

## 2014-10-25 MED ORDER — AZITHROMYCIN 250 MG PO TABS: ORAL_TABLET | ORAL | Status: DC

## 2014-10-25 MED ORDER — HYDROCODONE-HOMATROPINE 5-1.5 MG/5ML PO SYRP: ORAL_SOLUTION | ORAL | Status: DC

## 2014-10-25 NOTE — Progress Notes (Signed)
Dr. Frederico Hamman T. Warnell Rasnic, MD, [facility] [place], [address] Phone: [phone] Fax: [phone]  10/25/2014  Patient: [name], MRN: [id number], DOB: [birth year], 38 y.o.  Primary Physician:  Eliezer Lofts, MD  Chief Complaint: Acute Visit  Subjective:   [name] is a 38 y.o. very pleasant female patient who presents with the following:  2 weeks, coughing until throwing up, then with coughing up until throwing up. Sinuses now feels bad on both sides of face. Congestion, pain, cough.   The patient is very well-known to me, she has a history of Crohn's disease, and currently she is not in any type of disease modifying medications.  For 2 weeks now she has had persistently worsening cough, and she has been coughing actually to the point where she is vomiting.  She is also had some intermittent achiness, fevers, chills, and pain.  Now her primary complaint has become or maxillary sinuses which have worsened in the last few days.  Past Medical History, Surgical History, Social History, Family History, Problem List, Medications, and Allergies have been reviewed and updated if relevant.  ROS: GEN: Acute illness details above GI: Tolerating PO intake GU: maintaining adequate hydration and urination Pulm: No SOB Interactive and getting along well at home.  Otherwise, ROS is as per the HPI.   Objective:   BP 124/82 mmHg  Pulse 92  Temp(Src) 98.2 F (36.8 C) (Oral)  Resp 14  Wt 147 lb 12 oz (67.019 kg)  SpO2 98%   Gen: WDWN, NAD; alert,appropriate and cooperative throughout exam  HEENT: Normocephalic and atraumatic. Throat clear, w/o exudate, no LAD, R TM clear, L TM - good landmarks, No fluid present. rhinnorhea.  Left frontal and maxillary sinuses: Tender max Right frontal and maxillary sinuses: Tendermax  Neck: No ant or post LAD CV: RRR, No M/G/R Pulm: Breathing comfortably in no resp distress. no  w/c/r Abd: S,NT,ND,+BS Extr: no c/c/e Psych: full affect, pleasant    Laboratory and Imaging Data:  Assessment and Plan:   Acute maxillary sinusitis, recurrence not specified  Crohn's disease, without complications  Acute sinusitis: ABX as below.   Reviewed symptomatic care as well as ABX in this case.    Follow-up: No Follow-up on file.  New Prescriptions   AZITHROMYCIN (ZITHROMAX) 250 MG TABLET    2 tabs po on day 1, then 1 tab po for 4 days   HYDROCODONE-HOMATROPINE (HYCODAN) 5-1.5 MG/5ML SYRUP    1 tsp po at night before bed prn cough   No orders of the defined types were placed in this encounter.    Signed,  Maud Deed. Royce Stegman, MD   Patient's Medications  New Prescriptions   AZITHROMYCIN (ZITHROMAX) 250 MG TABLET    2 tabs po on day 1, then 1 tab po for 4 days   HYDROCODONE-HOMATROPINE (HYCODAN) 5-1.5 MG/5ML SYRUP    1 tsp po at night before bed prn cough  Previous Medications   ALBUTEROL (PROVENTIL HFA;VENTOLIN HFA) 108 (90 BASE) MCG/ACT INHALER    Inhale 2 puffs into the lungs every 6 (six) hours as needed for wheezing or shortness of breath.   ALPRAZOLAM (XANAX) 0.25 MG TABLET    TAKE 2 TABLETS BY MOUTH ONCE A DAY AS NEEDED FOR ANXIETY   BUTALBITAL-ACETAMINOPHEN-CAFFEINE (FIORICET, ESGIC) 50-325-40 MG PER TABLET    Take 2 tablets by mouth 2 (two) times daily as needed for headache.   CETIRIZINE (ZYRTEC) 10 MG TABLET  Take 10 mg by mouth every evening.    DEXLANSOPRAZOLE (DEXILANT) 60 MG CAPSULE    Take 60 mg by mouth every evening.    HYDROMORPHONE (DILAUDID) 2 MG TABLET    Take 2 mg by mouth every 6 (six) hours as needed for severe pain (pain).   MULTIPLE VITAMIN (MULTIVITAMIN) TABLET    Take 1 tablet by mouth daily.   NARATRIPTAN (AMERGE) 2.5 MG TABLET    Take 1 tablet (2.5 mg total) by mouth as needed for migraine. Take one (1) tablet at onset of headache; if returns or does not resolve, may repeat after 4 hours; do not exceed five (5) mg in 24 hours.    SERTRALINE (ZOLOFT) 50 MG TABLET    Take 50 mg by mouth at bedtime.    VITAMIN B1-B12 IJ    Inject 1.5 mLs as directed every 14 (fourteen) days.   ZOLPIDEM (AMBIEN) 10 MG TABLET    TAKE ONE TABLET BY MOUTH EVERY NIGHT AT BEDTIME  Modified Medications   No medications on file  Discontinued Medications   PRENATAL VIT-FE FUMARATE-FA (PRENATAL PO)    Take 1 tablet by mouth every evening.

## 2014-10-25 NOTE — Telephone Encounter (Signed)
Electronic request for hydrocodone-homatropine received. Medication is not currently on patient's med list. Patient last seen by PCP 08/20/14. Called patient to get more information but no answer. Please advise.

## 2014-10-25 NOTE — Progress Notes (Signed)
Pre visit review using our clinic review tool, if applicable. No additional management support is needed unless otherwise documented below in the visit note. 

## 2014-11-01 ENCOUNTER — Other Ambulatory Visit: Payer: Self-pay | Admitting: Family Medicine

## 2014-11-02 ENCOUNTER — Telehealth: Payer: Self-pay | Admitting: Family Medicine

## 2014-11-02 ENCOUNTER — Ambulatory Visit (INDEPENDENT_AMBULATORY_CARE_PROVIDER_SITE_OTHER): Payer: BC Managed Care – PPO | Admitting: Family Medicine

## 2014-11-02 ENCOUNTER — Encounter: Payer: Self-pay | Admitting: Family Medicine

## 2014-11-02 VITALS — BP 116/76 | HR 94 | Temp 98.1°F | Wt 146.2 lb

## 2014-11-02 DIAGNOSIS — O2341 Unspecified infection of urinary tract in pregnancy, first trimester: Secondary | ICD-10-CM

## 2014-11-02 DIAGNOSIS — R300 Dysuria: Secondary | ICD-10-CM

## 2014-11-02 MED ORDER — CEFPODOXIME PROXETIL 100 MG PO TABS: 100.0000 mg | ORAL_TABLET | Freq: Two times a day (BID) | ORAL | Status: AC

## 2014-11-02 NOTE — Progress Notes (Signed)
Subjective:    Patient ID: [name], female    DOB: 08-20-1976, 38 y.o.   MRN: [id number]  HPI  Pleasant 38 yo pregnant female pt of Dr. Diona Browner, new to me, here for ? UTI. [redacted] weeks pregnant- has had a few days of dysuria, increased urinary frequency.  Has been nauseated with her pregnancy but this has also been worse past few days.  No hematuria.  No vaginal spotting.  +back pain.  No cramping.  Has appt with OBGYN tomorrow.  Current Outpatient Prescriptions on File Prior to Visit  Medication Sig Dispense Refill  . albuterol (PROVENTIL HFA;VENTOLIN HFA) 108 (90 BASE) MCG/ACT inhaler Inhale 2 puffs into the lungs every 6 (six) hours as needed for wheezing or shortness of breath. 1 Inhaler 2  . ALPRAZolam (XANAX) 0.25 MG tablet TAKE 2 TABLETS BY MOUTH ONCE A DAY AS NEEDED FOR ANXIETY 30 tablet 0  . butalbital-acetaminophen-caffeine (FIORICET, ESGIC) 50-325-40 MG per tablet Take 2 tablets by mouth 2 (two) times daily as needed for headache.    . cetirizine (ZYRTEC) 10 MG tablet Take 10 mg by mouth every evening.     Marland Kitchen dexlansoprazole (DEXILANT) 60 MG capsule Take 60 mg by mouth every evening.     Marland Kitchen HYDROmorphone (DILAUDID) 2 MG tablet Take 2 mg by mouth every 6 (six) hours as needed for severe pain (pain).    . Multiple Vitamin (MULTIVITAMIN) tablet Take 1 tablet by mouth daily.    . naratriptan (AMERGE) 2.5 MG tablet Take 1 tablet (2.5 mg total) by mouth as needed for migraine. Take one (1) tablet at onset of headache; if returns or does not resolve, may repeat after 4 hours; do not exceed five (5) mg in 24 hours. 15 tablet 6  . sertraline (ZOLOFT) 50 MG tablet TAKE 1 TABLET BY MOUTH DAILY 30 tablet 5  . VITAMIN B1-B12 IJ Inject 1.5 mLs as directed every 14 (fourteen) days.    Marland Kitchen zolpidem (AMBIEN) 10 MG tablet TAKE ONE TABLET BY MOUTH EVERY NIGHT AT BEDTIME 30 tablet 0   No current facility-administered medications on file prior to visit.    Allergies  Allergen Reactions  .  Compazine [Prochlorperazine Edisylate] Anaphylaxis  . Reglan [Metoclopramide] Anaphylaxis and Other (See Comments)    Other reaction(s): GI Upset (intolerance) Intensifies her Chron's  . Sulfonamide Derivatives Other (See Comments)    Hematemesis; documented while a young child  . Clarithromycin Other (See Comments)    Reacts with chron's disease  . Sulfamethoxazole Rash and Other (See Comments)    Internal bleeding and kidney infection  . Biaxin [Clarithromycin] Other (See Comments)    Cant take due to Chrons disease  . Codeine Nausea And Vomiting and Other (See Comments)    Dilaudid and demerol OK  . Hydrocodone Nausea And Vomiting  . Macrobid [Nitrofurantoin] Nausea And Vomiting  . Phenergan [Promethazine Hcl] Other (See Comments)    "muscle twitches"  . Promethazine Rash and Other (See Comments)    Muscular seizures  . Promethazine Hcl Other (See Comments)    Muscular seizures  . Sulfa Antibiotics Hives and Other (See Comments)    Hematemesis; documented while a young child    Past Medical History  Diagnosis Date  . Crohn disease 2000  . Asthma   . Endometriosis   . Dysautonomia     recurrent syncope s/p ILR by Dr Doreatha Lew  . Palpitations   . Anxiety   . GERD (gastroesophageal reflux disease)   . Vertigo   .  Anginal pain only on 09/22/2014  . H/O hiatal hernia   . Chest pain     a. s/p intermediate risk nuclear stress test on 09/23/14 and normal LHC on 09/24/14   . Chronic nausea   . Migraine     Past Surgical History  Procedure Laterality Date  . Ileocecetomy  2002 X 2  . Loop recorder implant  11/2009    by DR Doreatha Lew  . Knee arthroscopy Bilateral 1988-1990  . Cesarean section  2011    emergency  CS due to placental abruption  . Partial colectomy  2002    partial removed due to crohns  . Appendectomy  ~ 1992  . Tonsillectomy  ~ 1996  . US echocardiography  11/11/2009    EF 55-60%  . Laparoscopy for ectopic pregnancy  11/2012  . Nasal sinus surgery  ~ 2007   . Endoscopic release transverse carpal ligament of hand Right 12/2010  . Colon surgery    . Cystoscopy w/ stone manipulation  X 2  . Cesarean section  2014    "preeclampsia"    Family History  Problem Relation Age of Onset  . Hypertension Mother   . Hyperlipidemia Mother   . Arthritis Mother   . Diabetes Father   . Coronary artery disease Father   . Heart disease Father 33    MI age 92  . Hyperlipidemia Brother   . Hypertension Brother     History   Social History  . Marital Status: Married    Spouse Name: Jonni Sanger    Number of Children: 2  . Years of Education: college   Occupational History  .  Other    Airline pilot  .  [place] History Main Topics  . Smoking status: Never Smoker   . Smokeless tobacco: Never Used  . Alcohol Use: Yes     Comment: 09/22/2014 "glass of wine maybe once/month"  . Drug Use: No  . Sexual Activity: Yes   Other Topics Concern  . Not on file   Social History Narrative   Patient lives at home with her husband Jonni Sanger). Patient works full time for [employer].    Education [employer].   Right handed.   Caffeine one cup of coffee daily.   The PMH, PSH, Social History, Family History, Medications, and allergies have been reviewed in [facility], and have been updated if relevant.   Review of Systems  Constitutional: Negative for fever.  Gastrointestinal: Positive for nausea. Negative for diarrhea.  Genitourinary: Negative for vaginal bleeding.  All other systems reviewed and are negative.      Objective:   Physical Exam  Constitutional: She appears well-developed and well-nourished. No distress.  Cardiovascular: Normal rate.   Pulmonary/Chest: Effort normal.  Abdominal: Soft. Bowel sounds are normal. She exhibits no distension. There is no rebound and no guarding.  No CVA tenderness  Skin: Skin is warm and dry.  Psychiatric: She has a normal mood and affect. Her behavior is normal. Judgment and thought  content normal.    BP 116/76 mmHg  Pulse 94  Temp(Src) 98.1 F (36.7 C) (Oral)  Wt 146 lb 4 oz (66.339 kg)  SpO2 94%  LMP 09/16/2014       Assessment & Plan:

## 2014-11-02 NOTE — Telephone Encounter (Signed)
Patient Information:  Caller Name: Mahayla  Phone: [phone]  Patient: [name], [name]  Gender: Female  DOB: September 11, 1976  Age: 38 Years  PCP: Eliezer Lofts (Family Practice)  Pregnant: No  Office Follow Up:  Does the office need to follow up with this patient?: No  Instructions For The Office: N/A   Symptoms  Reason For Call & Symptoms: EDC: 06/25/15, LMP: 09/18/14, 6 wk 4 days pregnant.    Onset   10/30/14 burning with urination to point of vomiting.  Taking Pyridium with no relief.   11/03/14 sxs cont with burning with urination, back burns, vomiting.  Afebrile.   No blood in urine.  No vaginal bleeding or discharge.  No abdominal pain.  Reviewed Health History In EMR: Yes  Reviewed Medications In EMR: Yes  Reviewed Allergies In EMR: Yes  Reviewed Surgeries / Procedures: Yes  Date of Onset of Symptoms: 10/30/2014  Treatments Tried: Pyridium  Treatments Tried Worked: No OB / GYN:  LMP: 09/18/2014  Guideline(s) Used:  Urination Pain - Female  Disposition Per Guideline:   Go to Office Now  Reason For Disposition Reached:   Side (flank) or lower back pain present  Advice Given:  Fluids:   Drink extra fluids. Drink 8-10 glasses of liquids a day (Reason: to produce a dilute, non-irritating urine).  Warm Saline SITZ Baths to Reduce Pain:  Sit in a warm saline bath for 20 minutes to cleanse the area and to reduce pain. Add 2 oz. of table salt or baking soda to a tub of water.  Call Back If:  You become worse.  Patient Will Follow Care Advice:  YES  Appointment Scheduled:  11/02/2014 12:45:00 Appointment Scheduled Provider:  Arnette Norris [facility])

## 2014-11-02 NOTE — Patient Instructions (Signed)
Nice to meet you. Congratulations. We will call you with your culture results. Please keep your scheduled appt with your OB tomorrow.

## 2014-11-02 NOTE — Progress Notes (Signed)
Pre visit review using our clinic review tool, if applicable. No additional management support is needed unless otherwise documented below in the visit note. 

## 2014-11-02 NOTE — Assessment & Plan Note (Addendum)
Complicated by first trimester pregnancy and AZO use- unable to read UA dip. Since she is symptomatic, will treat with abx- vantin (pregnancy class B)100 mg twice daily x 7 days.  Send urine for cx.  She will keep appt with OB tomorrow and [facility] she develops vaginal bleeding or if symptoms worsen.

## 2014-11-04 LAB — URINE CULTURE
Colony Count: NO GROWTH
Organism ID, Bacteria: NO GROWTH

## 2014-11-08 ENCOUNTER — Observation Stay (HOSPITAL_COMMUNITY)
Admission: AD | Admit: 2014-11-08 | Discharge: 2014-11-10 | Disposition: A | Discharge status: Home / Self Care | Payer: BC Managed Care – PPO | Source: Ambulatory Visit | Attending: Obstetrics and Gynecology | Admitting: Obstetrics and Gynecology

## 2014-11-08 ENCOUNTER — Encounter (HOSPITAL_COMMUNITY): Payer: Self-pay

## 2014-11-08 DIAGNOSIS — G901 Familial dysautonomia [Riley-Day]: Secondary | ICD-10-CM | POA: Insufficient documentation

## 2014-11-08 DIAGNOSIS — O99611 Diseases of the digestive system complicating pregnancy, first trimester: Secondary | ICD-10-CM | POA: Insufficient documentation

## 2014-11-08 DIAGNOSIS — Z3A01 Less than 8 weeks gestation of pregnancy: Secondary | ICD-10-CM | POA: Insufficient documentation

## 2014-11-08 DIAGNOSIS — Z79899 Other long term (current) drug therapy: Secondary | ICD-10-CM | POA: Insufficient documentation

## 2014-11-08 DIAGNOSIS — O99341 Other mental disorders complicating pregnancy, first trimester: Secondary | ICD-10-CM | POA: Insufficient documentation

## 2014-11-08 DIAGNOSIS — I209 Angina pectoris, unspecified: Secondary | ICD-10-CM | POA: Diagnosis not present

## 2014-11-08 DIAGNOSIS — O99511 Diseases of the respiratory system complicating pregnancy, first trimester: Secondary | ICD-10-CM | POA: Diagnosis not present

## 2014-11-08 DIAGNOSIS — F419 Anxiety disorder, unspecified: Secondary | ICD-10-CM | POA: Diagnosis not present

## 2014-11-08 DIAGNOSIS — O210 Mild hyperemesis gravidarum: Principal | ICD-10-CM | POA: Insufficient documentation

## 2014-11-08 DIAGNOSIS — Z8759 Personal history of other complications of pregnancy, childbirth and the puerperium: Secondary | ICD-10-CM

## 2014-11-08 DIAGNOSIS — O99351 Diseases of the nervous system complicating pregnancy, first trimester: Secondary | ICD-10-CM | POA: Insufficient documentation

## 2014-11-08 DIAGNOSIS — O99411 Diseases of the circulatory system complicating pregnancy, first trimester: Secondary | ICD-10-CM | POA: Insufficient documentation

## 2014-11-08 DIAGNOSIS — K5090 Crohn's disease, unspecified, without complications: Secondary | ICD-10-CM | POA: Insufficient documentation

## 2014-11-08 DIAGNOSIS — K219 Gastro-esophageal reflux disease without esophagitis: Secondary | ICD-10-CM | POA: Insufficient documentation

## 2014-11-08 DIAGNOSIS — J45909 Unspecified asthma, uncomplicated: Secondary | ICD-10-CM | POA: Insufficient documentation

## 2014-11-08 DIAGNOSIS — G43909 Migraine, unspecified, not intractable, without status migrainosus: Secondary | ICD-10-CM | POA: Insufficient documentation

## 2014-11-08 LAB — CBC
HCT: 33 % — ABNORMAL LOW (ref 36.0–46.0)
Hemoglobin: 11 g/dL — ABNORMAL LOW (ref 12.0–15.0)
MCH: 28.4 pg (ref 26.0–34.0)
MCHC: 33.3 g/dL (ref 30.0–36.0)
MCV: 85.3 fL (ref 78.0–100.0)
PLATELETS: 296 10*3/uL (ref 150–400)
RBC: 3.87 MIL/uL (ref 3.87–5.11)
RDW: 13.3 % (ref 11.5–15.5)
WBC: 7.7 10*3/uL (ref 4.0–10.5)

## 2014-11-08 LAB — COMPREHENSIVE METABOLIC PANEL
ALK PHOS: 79 U/L (ref 39–117)
ALT: 14 U/L (ref 0–35)
AST: 14 U/L (ref 0–37)
Albumin: 3.3 g/dL — ABNORMAL LOW (ref 3.5–5.2)
Anion gap: 11 (ref 5–15)
BUN: 8 mg/dL (ref 6–23)
CALCIUM: 8.7 mg/dL (ref 8.4–10.5)
CO2: 22 mEq/L (ref 19–32)
Chloride: 102 mEq/L (ref 96–112)
Creatinine, Ser: 0.56 mg/dL (ref 0.50–1.10)
GFR calc Af Amer: >90 mL/min (ref >90)
Glucose, Bld: 107 mg/dL — ABNORMAL HIGH (ref 70–99)
POTASSIUM: 3.4 meq/L — AB (ref 3.7–5.3)
Sodium: 135 mEq/L — ABNORMAL LOW (ref 137–147)
Total Bilirubin: <0.2 mg/dL — ABNORMAL LOW (ref 0.3–1.2)
Total Protein: 6.2 g/dL (ref 6.0–8.3)

## 2014-11-08 MED ORDER — SODIUM CHLORIDE 0.9 % IJ SOLN: 10.0000 mL | Freq: Two times a day (BID) | INTRAMUSCULAR | Status: DC

## 2014-11-08 MED ORDER — ZOLPIDEM TARTRATE 5 MG PO TABS
10.0000 mg | ORAL_TABLET | Freq: Every day | ORAL | Status: DC
  Administered 2014-11-08 – 2014-11-09 (×2): 10 mg via ORAL
  Filled 2014-11-08 (×3): qty 2

## 2014-11-08 MED ORDER — FAMOTIDINE IN NACL 20-0.9 MG/50ML-% IV SOLN
20.0000 mg | Freq: Two times a day (BID) | INTRAVENOUS | Status: DC
  Administered 2014-11-08 – 2014-11-10 (×4): 20 mg via INTRAVENOUS
  Filled 2014-11-08 (×4): qty 50

## 2014-11-08 MED ORDER — SERTRALINE HCL 50 MG PO TABS
50.0000 mg | ORAL_TABLET | Freq: Every day | ORAL | Status: DC
  Administered 2014-11-08 – 2014-11-09 (×2): 50 mg via ORAL
  Filled 2014-11-08 (×2): qty 1

## 2014-11-08 MED ORDER — NON FORMULARY: 60.0000 mg | Freq: Every day | Status: DC

## 2014-11-08 MED ORDER — NON FORMULARY: 10.0000 mg | Freq: Every day | Status: DC

## 2014-11-08 MED ORDER — DEXTROSE IN LACTATED RINGERS 5 % IV SOLN
INTRAVENOUS | Status: DC
  Administered 2014-11-08 – 2014-11-10 (×4): via INTRAVENOUS

## 2014-11-08 MED ORDER — CETIRIZINE HCL 10 MG PO TABS
10.0000 mg | ORAL_TABLET | Freq: Every day | ORAL | Status: DC
  Administered 2014-11-08 – 2014-11-09 (×2): 10 mg via ORAL

## 2014-11-08 MED ORDER — PRENATAL MULTIVITAMIN CH
1.0000 | ORAL_TABLET | Freq: Every day | ORAL | Status: DC
  Filled 2014-11-08: qty 1

## 2014-11-08 MED ORDER — DEXLANSOPRAZOLE 60 MG PO CPDR
60.0000 mg | DELAYED_RELEASE_CAPSULE | Freq: Every day | ORAL | Status: DC
  Administered 2014-11-08 – 2014-11-09 (×2): 60 mg via ORAL

## 2014-11-08 MED ORDER — SODIUM CHLORIDE 0.9 % IJ SOLN: 10.0000 mL | INTRAMUSCULAR | Status: DC | PRN

## 2014-11-08 MED ORDER — ACETAMINOPHEN 325 MG PO TABS: 650.0000 mg | ORAL_TABLET | Freq: Four times a day (QID) | ORAL | Status: DC | PRN

## 2014-11-08 MED ORDER — ONDANSETRON HCL 4 MG/2ML IJ SOLN
4.0000 mg | Freq: Four times a day (QID) | INTRAMUSCULAR | Status: DC | PRN
  Administered 2014-11-08 – 2014-11-10 (×6): 4 mg via INTRAVENOUS
  Filled 2014-11-08 (×7): qty 2

## 2014-11-08 NOTE — Plan of Care (Signed)
Problem: Phase II Progression Outcomes Goal: Hemodynamically stable Outcome: Progressing

## 2014-11-08 NOTE — Plan of Care (Signed)
Problem: Phase II Progression Outcomes Goal: Maintains admission weight or weight gain Outcome: Progressing

## 2014-11-08 NOTE — Care Management Note (Unsigned)
Page 1 of 2   11/10/2014     11:27:59 AM CARE MANAGEMENT NOTE 11/10/2014  Patient:  [name], [name]   Account Number:  [id number]  Date Initiated:  11/08/2014  Documentation initiated by:  Juliet Rude  Subjective/Objective Assessment:   hyperemesis 7 + weeks     Action/Plan:   Zofran infusion through Porta Cath at home   Anticipated DC Date:  11/10/2014   Anticipated DC Plan:  [place]  CM consult      [place]   Choice offered to / List presented to:  C-1 Patient   DME arranged  ZOFRAN/REGLAN PUMP IV fluids at home  IV Pepcid      DME agency  [place] arranged  HH-1 RN      [place].   Status of service:  completed  Discharge Disposition:  home health services  Per UR Regulation:  Reviewed for med. necessity/level of care/duration of stay  If Comments:   11/10/14 0830  L. Arvella Nigh [facility] BSN # [phone]- received call from RN on unit stating patient is planning on going home this am and has appointments at [facility] at 1:00 today and wants to get [place] set up prior to discharge.  CM went to unit and met with patient in room with Dr. Corinna Capra - attending.  Orders received for specific dosages for Zofran continuously through porta cath.  CM called and spoke to Carolynn Sayers (IV RN lisason with [facility] # [phone]) and she is working on when [facility] can set up Zofran continously.  She stated probably not be able to get started prior to 1:00 pm today before patient needs to leave hospital.  Patient aware.  PLan is for patient to have needle on porta cath to be changed with [facility] weekly - per patient's choice but [place] will provide Zofran, Pepcid IV and also IV fluids for at home use.  Awaiting to hear back from [facility] from [facility]  of  exact time and plan of when [facility] can start home Zofran infusion thorugh porta  cath.  11/09/14, Aida Raider RNC-MNN, BSN, [phone], CM made multiple attempts this morning to reach [facility] to no avail. Number we have is a non working number.  Tried to [employer] and made several calls...unable to reach anyone that could help.  CM met with pt and shared this information with her.  Pt states she is okay with using [facility].  Erasmo Downer and Pam notified of order and confirmation received.  Will continue to follow.  11-08-14 1830 L. Arvella Nigh [facility] BSN # [phone] Majestic to see patient in room in length and talked to she and her husband.  Patient has had 2 previous pregnancies and has had hyperemesis with previous pregnancies.  Patient also has Chronn's disease.  Patient currently has a Port a Cath and has been going to the [facility] (# [phone])  in [facility] for flushes with [facility] cath. Contacts at the [facility] is (Amy or [place] ) per patient. Plan per patient and physician is to have patient have a Zofran pump that will continously infuse in her porta cath. Offered choice to patient of [place] and patients 1st choice is [facility] and 2nd choice is [place].  Demographics reviewed and verified with  patient.  Currently it is  after hours to contact [place] for referral.  Will have CM in the AM contact [facility] with referral.  CM spoke to Dr. Gaetano Net via phone and orders put in for [facility] RN and Zofran to infuse in porta cath. Specific orders of dosage of Zofran not entered per physician at this time will need to have pharmacy consult prior to verify dosage before discharge.  CM will follow up in the am.

## 2014-11-08 NOTE — Plan of Care (Signed)
Problem: Phase I Progression Outcomes Goal: OOB as tolerated unless otherwise ordered Outcome: Progressing

## 2014-11-08 NOTE — H&P (Signed)
[name] is an 38 y.o. female currently 7 2/7 wks with recurrent hyperemesis. Last pregnancy complicated by PICC line infection and placement of a porta cath with a continuous infusion Zofran pump and outpatient IV fluids. She now has recurrent N/V refractory to conservative measures and presents for hydration and treatment of N/V.  Pertinent Gynecological History: Menses: N/A Bleeding: N/A Contraception: N/A DES exposure: denies Blood transfusions: unknown Sexually transmitted diseases: no past history Previous GYN Procedures: none  Last mammogram: N/A Date: N/A Last pap: unknown Date: N/A OB History: G4, P2   Menstrual History: Menarche age: unknown  Patient's last menstrual period was 09/16/2014.    Past Medical History  Diagnosis Date  . Crohn disease 2000  . Asthma   . Endometriosis   . Dysautonomia     recurrent syncope s/p ILR by Dr Doreatha Lew  . Palpitations   . Anxiety   . GERD (gastroesophageal reflux disease)   . Vertigo   . Anginal pain only on 09/22/2014  . H/O hiatal hernia   . Chest pain     a. s/p intermediate risk nuclear stress test on 09/23/14 and normal LHC on 09/24/14   . Chronic nausea   . Migraine     Past Surgical History  Procedure Laterality Date  . Ileocecetomy  2002 X 2  . Loop recorder implant  11/2009    by DR Doreatha Lew  . Knee arthroscopy Bilateral 1988-1990  . Cesarean section  2011    emergency  CS due to placental abruption  . Partial colectomy  2002    partial removed due to crohns  . Appendectomy  ~ 1992  . Tonsillectomy  ~ 1996  . US echocardiography  11/11/2009    EF 55-60%  . Laparoscopy for ectopic pregnancy  11/2012  . Nasal sinus surgery  ~ 2007  . Endoscopic release transverse carpal ligament of hand Right 12/2010  . Colon surgery    . Cystoscopy w/ stone manipulation  X 2  . Cesarean section  2014    "preeclampsia"    Family History  Problem Relation Age of Onset  . Hypertension Mother   . Hyperlipidemia Mother    . Arthritis Mother   . Diabetes Father   . Coronary artery disease Father   . Heart disease Father 69    MI age 71  . Hyperlipidemia Brother   . Hypertension Brother     Social History:  reports that she has never smoked. She has never used smokeless tobacco. She reports that she drinks alcohol. She reports that she does not use illicit drugs.  Allergies:  Allergies  Allergen Reactions  . Compazine [Prochlorperazine Edisylate] Anaphylaxis  . Reglan [Metoclopramide] Anaphylaxis and Other (See Comments)    Other reaction(s): GI Upset (intolerance) Intensifies her Chron's  . Sulfonamide Derivatives Other (See Comments)    Hematemesis; documented while a young child  . Clarithromycin Other (See Comments)    Reacts with chron's disease  . Sulfamethoxazole Rash and Other (See Comments)    Internal bleeding and kidney infection  . Biaxin [Clarithromycin] Other (See Comments)    Cant take due to Chrons disease  . Codeine Nausea And Vomiting and Other (See Comments)    Dilaudid and demerol OK  . Hydrocodone Nausea And Vomiting  . Macrobid [Nitrofurantoin] Nausea And Vomiting  . Phenergan [Promethazine Hcl] Other (See Comments)    "muscle twitches"  . Promethazine Rash and Other (See Comments)    Muscular seizures  . Promethazine Hcl Other (  See Comments)    Muscular seizures  . Sulfa Antibiotics Hives and Other (See Comments)    Hematemesis; documented while a young child    Prescriptions prior to admission  Medication Sig Dispense Refill Last Dose  . acetaminophen (TYLENOL) 500 MG tablet Take 1,000 mg by mouth every 6 (six) hours as needed for headache.   Past Week at Unknown time  . butalbital-acetaminophen-caffeine (FIORICET, ESGIC) 50-325-40 MG per tablet Take 2 tablets by mouth 2 (two) times daily as needed for headache.   Past Month at Unknown time  . cetirizine (ZYRTEC) 10 MG tablet Take 10 mg by mouth every evening.    11/07/2014 at Unknown time  . dexlansoprazole  (DEXILANT) 60 MG capsule Take 60 mg by mouth every evening.    11/07/2014 at Unknown time  . famotidine (PEPCID) 20 MG tablet Take 20 mg by mouth 2 (two) times daily.   11/08/2014 at Unknown time  . HYDROcodone-homatropine (HYCODAN) 5-1.5 MG/5ML syrup Take 5 mLs by mouth at bedtime as needed for cough.   11/07/2014 at Unknown time  . ondansetron (ZOFRAN) 8 MG tablet Take 8 mg by mouth every 6 (six) hours as needed for nausea or vomiting.   11/08/2014 at Unknown time  . Prenatal Vit-Fe Fumarate-FA (PRENATAL PO) Take 2 tablets by mouth every evening. Prenatal gummy vitamin.   11/07/2014 at Unknown time  . Probiotic Product (PROBIOTIC PO) Take 2 tablets by mouth every evening. Probiotic gummy.   11/07/2014 at Unknown time  . sertraline (ZOLOFT) 50 MG tablet TAKE 1 TABLET BY MOUTH DAILY 30 tablet 5 11/07/2014 at Unknown time  . VITAMIN B1-B12 IJ Inject 1.5 mLs as directed every 14 (fourteen) days.   Past Month at Unknown time  . zolpidem (AMBIEN) 10 MG tablet TAKE ONE TABLET BY MOUTH EVERY NIGHT AT BEDTIME 30 tablet 0 11/07/2014 at Unknown time  . albuterol (PROVENTIL HFA;VENTOLIN HFA) 108 (90 BASE) MCG/ACT inhaler Inhale 2 puffs into the lungs every 6 (six) hours as needed for wheezing or shortness of breath. 1 Inhaler 2 rescue  . ALPRAZolam (XANAX) 0.25 MG tablet TAKE 2 TABLETS BY MOUTH ONCE A DAY AS NEEDED FOR ANXIETY (Patient not taking: Reported on 11/08/2014) 30 tablet 0 Taking  . cefpodoxime (VANTIN) 100 MG tablet Take 1 tablet (100 mg total) by mouth 2 (two) times daily. (Patient not taking: Reported on 11/08/2014) 14 tablet 0   . HYDROmorphone (DILAUDID) 2 MG tablet Take 2 mg by mouth every 6 (six) hours as needed for severe pain (pain).   over 1 month  . Multiple Vitamin (MULTIVITAMIN) tablet Take 1 tablet by mouth daily.   Taking  . naratriptan (AMERGE) 2.5 MG tablet Take 1 tablet (2.5 mg total) by mouth as needed for migraine. Take one (1) tablet at onset of headache; if returns or does not  resolve, may repeat after 4 hours; do not exceed five (5) mg in 24 hours. (Patient not taking: Reported on 11/08/2014) 15 tablet 6 Taking    Review of Systems  Gastrointestinal: Positive for nausea and vomiting.    Blood pressure 112/68, pulse 94, temperature 99.2 F (37.3 C), temperature source Oral, resp. rate 18, weight 143 lb (64.864 kg), last menstrual period 09/16/2014. Physical Exam  Vitals reviewed.   No results found for this or any previous visit (from the past 24 hour(s)).  No results found.  Assessment/Plan: 38 yo G4 P0111 at 7 /27 weeks with recurrent severe N/V Will begin IV fluids, Zofran prn, advance diet prn,  check labs Will arrange outpatient IV fluids and Zofran pump that gave relief last pregnancy  Brexton Sofia II,Marzell Isakson E 11/08/2014, 8:07 PM

## 2014-11-08 NOTE — Plan of Care (Signed)
Problem: Phase I Progression Outcomes Goal: Initial weight on admission obtained Outcome: Progressing

## 2014-11-08 NOTE — Plan of Care (Signed)
Problem: Phase I Progression Outcomes Goal: Voiding-avoid urinary catheter unless indicated Outcome: Progressing

## 2014-11-08 NOTE — Plan of Care (Signed)
Problem: Phase I Progression Outcomes Goal: Pain controlled with appropriate interventions Outcome: Progressing

## 2014-11-08 NOTE — Plan of Care (Signed)
Problem: Phase I Progression Outcomes Goal: Hemodynamically stable Outcome: Progressing

## 2014-11-08 NOTE — Plan of Care (Signed)
Problem: Phase II Progression Outcomes Goal: Adequate hydration Outcome: Progressing

## 2014-11-08 NOTE — Plan of Care (Signed)
Problem: Phase II Progression Outcomes Goal: Pain controlled Outcome: Progressing

## 2014-11-08 NOTE — Plan of Care (Signed)
Problem: Phase I Progression Outcomes Goal: Pre-pregnancy weight reviewed Outcome: Progressing

## 2014-11-09 ENCOUNTER — Ambulatory Visit (HOSPITAL_COMMUNITY)
Admit: 2014-11-09 | Discharge: 2014-11-09 | Disposition: A | Discharge status: Home / Self Care | Payer: BC Managed Care – PPO | Attending: Obstetrics and Gynecology | Admitting: Obstetrics and Gynecology

## 2014-11-09 ENCOUNTER — Encounter (HOSPITAL_COMMUNITY): Payer: Self-pay

## 2014-11-09 DIAGNOSIS — K5090 Crohn's disease, unspecified, without complications: Secondary | ICD-10-CM

## 2014-11-09 DIAGNOSIS — O210 Mild hyperemesis gravidarum: Secondary | ICD-10-CM | POA: Diagnosis not present

## 2014-11-09 DIAGNOSIS — Z3A01 Less than 8 weeks gestation of pregnancy: Secondary | ICD-10-CM

## 2014-11-09 DIAGNOSIS — O99611 Diseases of the digestive system complicating pregnancy, first trimester: Secondary | ICD-10-CM

## 2014-11-09 DIAGNOSIS — O211 Hyperemesis gravidarum with metabolic disturbance: Secondary | ICD-10-CM | POA: Insufficient documentation

## 2014-11-09 MED ORDER — GUAIFENESIN 100 MG/5ML PO SYRP
200.0000 mg | ORAL_SOLUTION | ORAL | Status: DC | PRN
  Administered 2014-11-09 (×2): 200 mg via ORAL
  Filled 2014-11-09 (×4): qty 10

## 2014-11-09 NOTE — Plan of Care (Signed)
Problem: Phase I Progression Outcomes Goal: Pain controlled with appropriate interventions Outcome: Completed/Met Date Met:  11/09/14 Goal: OOB as tolerated unless otherwise ordered Outcome: Completed/Met Date Met:  11/09/14 Goal: Initial discharge plan identified Outcome: Completed/Met Date Met:  11/09/14 Goal: Voiding-avoid urinary catheter unless indicated Outcome: Completed/Met Date Met:  11/09/14 Goal: Pre-pregnancy weight reviewed Outcome: Completed/Met Date Met:  11/09/14 Goal: Initial weight on admission obtained Outcome: Completed/Met Date Met:  11/09/14 Goal: Hemodynamically stable Outcome: Completed/Met Date Met:  11/09/14 Goal: Other Phase I Outcomes/Goals Outcome: Not Applicable Date Met:  34/35/68

## 2014-11-09 NOTE — Plan of Care (Signed)
Problem: Phase II Progression Outcomes Goal: Adequate hydration Outcome: Completed/Met Date Met:  11/09/14 Goal: Hemodynamically stable Outcome: Completed/Met Date Met:  11/09/14 Goal: Pain controlled Outcome: Completed/Met Date Met:  11/09/14 Goal: Maintains admission weight or weight gain Outcome: Completed/Met Date Met:  11/09/14 Goal: Progress activity as tolerated unless otherwise ordered Outcome: Completed/Met Date Met:  11/09/14 Goal: Tolerating diet Outcome: Completed/Met Date Met:  11/09/14

## 2014-11-09 NOTE — Progress Notes (Signed)
MATERNAL FETAL MEDICINE CONSULT  Patient Name: [name] Record Number:  [id number] Date of Birth: [birth year] Requesting Physician Name:  Allena Katz, MD Date of Service: 11/09/2014  Chief Complaint Hyperemesis gravidarum  History of Present Illness [name] was seen today for prenatal diagnosis secondary to Hyperemesis gravidarum at the request of Dr. Everlene Farrier.  The patient is a 38 y.o. [id number],EH [redacted]w[redacted]d  [name] a history of severe hyperemesis in both of her prior pregnancies with multiple hospitalizations, including 2 PICC lines and multiple line infections last pregnancy with placement of a portacath around 25 weeks.  She required a zofran pump and IV pepcid 20 mg BID last pregnancy to maintain control of her hyperemesis.  She additionally has history of G1 being IUGR with delivery at 32 weeks for deteriorating Dopper studies, G2 ectopic with MTX, G3 delivery at 34 weeks with preeclampsia with severe features.  She today reports that she has had N/V since Friday despite taking po Zofran (863mq 6) and po pepcid (20 mg q12).  She reports being unable to hold down food/liquids with bilious emesis beginning on Monday at which point she called her doctors office and was admitted.  She reports feeling better currently and able to hold down sips/some food with improved nausea.  She reports her zofran pump is in the process of being ordered/arranged with home health.   She denies cramping/vaginal bleeding  Review of Systems Pertinent items are noted in HPI.  Patient History OB History  Gravida Para Term Preterm AB SAB TAB Ectopic Multiple Living  4 1 0 1 1 0 0 1 0 1     # Outcome Date GA Lbr Len/2nd Weight Sex Delivery Anes PTL Lv  4 Current           3 Gravida              Comments: System Generated. Please review and update pregnancy details.  2 Preterm  [id number] CS-Unspec   Y  1 Ectopic              Comments: System Generated. Please review and update  pregnancy details.      Past Medical History  Diagnosis Date  . Crohn disease 2000  . Asthma   . Endometriosis   . Dysautonomia     recurrent syncope s/p ILR by Dr TenDoreatha Lew Palpitations   . Anxiety   . GERD (gastroesophageal reflux disease)   . Vertigo   . Anginal pain only on 09/22/2014  . H/O hiatal hernia   . Chest pain     a. s/p intermediate risk nuclear stress test on 09/23/14 and normal LHC on 09/24/14   . Chronic nausea   . Migraine     Past Surgical History  Procedure Laterality Date  . Ileocecetomy  2002 X 2  . Loop recorder implant  11/2009    by DR TenDoreatha Lew Knee arthroscopy Bilateral 1988-1990  . Cesarean section  2011    emergency  CS due to placental abruption  . Partial colectomy  2002    partial removed due to crohns  . Appendectomy  ~ 1992  . Tonsillectomy  ~ 1996  . Us Koreahocardiography  11/11/2009    EF 55-60%  . Laparoscopy for ectopic pregnancy  11/2012  . Nasal sinus surgery  ~ 2007  . Endoscopic release transverse carpal ligament of hand Right 12/2010  . Colon surgery    .  Cystoscopy w/ stone manipulation  X 2  . Cesarean section  2014    "preeclampsia"    History   Social History  . Marital Status: Married    Spouse Name: Jonni Sanger    Number of Children: 2  . Years of Education: college   Occupational History  .  Other    Airline pilot  .  [place] History Main Topics  . Smoking status: Never Smoker   . Smokeless tobacco: Never Used  . Alcohol Use: Yes     Comment: 09/22/2014 "glass of wine maybe once/month"  . Drug Use: No  . Sexual Activity: Yes   Other Topics Concern  . None   Social History Narrative   Patient lives at home with her husband Jonni Sanger). Patient works full time for [employer].    Education [employer].   Right handed.   Caffeine one cup of coffee daily.    Family History  Problem Relation Age of Onset  . Hypertension Mother   . Hyperlipidemia Mother   . Arthritis  Mother   . Diabetes Father   . Coronary artery disease Father   . Heart disease Father 26    MI age 61  . Hyperlipidemia Brother   . Hypertension Brother    In addition, the patient has no family history of mental retardation, birth defects, or genetic diseases.  Physical Examination BP 108/69 P 85 T 99 RR 18  General appearance - alert, well appearing, and in no distress  Assessment and Recommendations Hyperemesis gravidarum-  [name] is feeling better on IV zofran/pepcid.  She has a zofran pump in process.  She has new OB nurse/provider/ultrasound visits tomorrow afternoon beginning at 1 pm at [facility].  She is planning on a shared care model of prenatal care with [facility] and our group in [place] due to her proximity to [place] and desire to deliver at [facility] should she make it to term.  I have advised her to keep her appointments if she is able to be discharged in time or to call to reschedule.  At this point I think outpatient management is reasonable and agree with the plan for a zofran pump/IV pepcid delivery via her portacath.  I have discussed this plan with Dr. Renella Cunas who is in agreement as well.   Crohns disease- [name] has Crohns disease which tends to remit in her pregnancies.  No current flare.   Advanced maternal age-  [name] is planning on NIPS for aneuploidy screening.   George Hugh MD Maternal Fetal Medicine Fellow- PGY 7  45 minutes was spent with the patient >50% of which was face to face counseling on the above.

## 2014-11-09 NOTE — Progress Notes (Signed)
Patient states she has taken Diclegis and oral Zofran but had protracted vomiting the last 4 days. Now feeling a little better. Tolerated some sips.  Filed Vitals:   11/09/14 0647  BP: 108/69  Pulse: 85  Temp: 99 F (37.2 C)  Resp: 18   IV fluids running via porta cath  A/P: D/W patient         She has MFM consult tomorrow-will ask MFM to see her today

## 2014-11-10 DIAGNOSIS — O210 Mild hyperemesis gravidarum: Secondary | ICD-10-CM | POA: Diagnosis not present

## 2014-11-10 MED ORDER — ONDANSETRON HCL 4 MG/2ML IJ SOLN
4.0000 mg | Freq: Once | INTRAMUSCULAR | Status: AC
  Administered 2014-11-10: 4 mg via INTRAVENOUS
  Filled 2014-11-10: qty 2

## 2014-11-10 MED ORDER — BENZONATATE 100 MG PO CAPS
200.0000 mg | ORAL_CAPSULE | Freq: Three times a day (TID) | ORAL | Status: DC
Start: 2014-11-10 — End: 2014-11-10
  Administered 2014-11-10: 200 mg via ORAL
  Filled 2014-11-10: qty 2

## 2014-11-10 MED ORDER — BENZONATATE 200 MG PO CAPS: 200.0000 mg | ORAL_CAPSULE | Freq: Three times a day (TID) | ORAL | Status: DC

## 2014-11-10 MED ORDER — FAMOTIDINE IN NACL 20-0.9 MG/50ML-% IV SOLN: 20.0000 mg | Freq: Two times a day (BID) | INTRAVENOUS | Status: DC

## 2014-11-10 MED ORDER — ONDANSETRON HCL 4 MG/2ML IJ SOLN: 4.0000 mg | Freq: Four times a day (QID) | INTRAMUSCULAR | Status: DC | PRN

## 2014-11-10 NOTE — Progress Notes (Signed)
C/o non- productive cough. Nausea continues. Vomitus this am bilous material. Tolerating bagel this am  Vss , afebrile Abd soft, denies leakage or vaginal bleeding A/P Tessalon Pearles 267m tid  Hopeful for discharge this am, [facility]

## 2014-11-10 NOTE — Progress Notes (Signed)
Pt discharged to home with father-in-law.  Condition stable. Pt ambulated to car with RN.  Port-A-Cath line flushed with 10cc normal saline.  Home Health RN to meet patient this afternoon and begin home IVF and continuous Zofran infusion.  No equipment for home ordered at discharge.

## 2014-11-10 NOTE — Plan of Care (Signed)
Problem: Phase II Progression Outcomes Goal: Discharge plan established Outcome: Completed/Met Date Met:  11/10/14 Goal: Other Phase II Outcomes/Goals Outcome: Completed/Met Date Met:  11/10/14  Problem: Discharge Progression Outcomes Goal: Barriers To Progression Addressed/Resolved Outcome: Completed/Met Date Met:  11/10/14 Goal: Discharge plan in place and appropriate Outcome: Completed/Met Date Met:  11/10/14 Goal: Pain controlled with appropriate interventions Outcome: Completed/Met Date Met:  11/10/14 Goal: Maintains admission weight or weight gain Outcome: Completed/Met Date Met:  11/10/14 Goal: Hemodynamically stable Outcome: Completed/Met Date Met:  62/83/66 Goal: Complications resolved/controlled Outcome: Completed/Met Date Met:  11/10/14 Goal: Tolerating diet Outcome: Completed/Met Date Met:  11/10/14 Goal: Activity appropriate for discharge plan Outcome: Completed/Met Date Met:  11/10/14 Goal: Other Discharge Outcomes/Goals Outcome: Completed/Met Date Met:  11/10/14

## 2014-11-11 ENCOUNTER — Encounter: Payer: BC Managed Care – PPO | Admitting: Internal Medicine

## 2014-11-21 ENCOUNTER — Other Ambulatory Visit: Payer: Self-pay | Admitting: Family Medicine

## 2014-11-22 NOTE — Telephone Encounter (Signed)
Called to [facility].

## 2014-11-22 NOTE — Telephone Encounter (Signed)
Last office visit 11/02/2014 with Dr. Deborra Medina.  Last refilled 10/22/2014 for #30 with no refills.  Ok to refill?

## 2014-11-23 ENCOUNTER — Encounter: Payer: Self-pay | Admitting: Internal Medicine

## 2014-11-23 ENCOUNTER — Ambulatory Visit (INDEPENDENT_AMBULATORY_CARE_PROVIDER_SITE_OTHER): Payer: BC Managed Care – PPO | Admitting: Internal Medicine

## 2014-11-23 VITALS — BP 110/80 | HR 76 | Ht 64.0 in | Wt 145.0 lb

## 2014-11-23 DIAGNOSIS — R002 Palpitations: Secondary | ICD-10-CM

## 2014-11-23 DIAGNOSIS — R0789 Other chest pain: Secondary | ICD-10-CM

## 2014-11-23 DIAGNOSIS — R55 Syncope and collapse: Secondary | ICD-10-CM

## 2014-11-23 LAB — MDC_IDC_ENUM_SESS_TYPE_INCLINIC

## 2014-11-23 NOTE — Progress Notes (Signed)
HPI [name] returns today after a long absence from our arrhythmia clinic. She is a very pleasant 38 year old woman with a history of autonomic dysfunction, who is also known to have hyperemesis gravidarum. She is currently in her first trimester of pregnancy, her third child. The patient had severe hyperemesis during both of her pregnancies. Over the years, she has been able to control her autonomic dysfunction fairly well. She is currently receiving intravenous therapy for nausea by way of her Port-A-Cath, and is also receiving a liter of lactated Ringer's. Despite medical therapy, she has had a very difficult time keeping any food down. She's had no recent syncopal episodes. She continues to try and work, Photographer in the fifth grade. The patient previously had an implantable loop recorder placed which is now past end-of-life and cannot be communicated with. She has worn a cardiac monitor which demonstrated sinus tachycardia. She denies chest pain or peripheral edema. Of note, the patient developed preeclampsia during her second delivery. Allergies  Allergen Reactions  . Compazine [Prochlorperazine Edisylate] Anaphylaxis  . Reglan [Metoclopramide] Anaphylaxis and Other (See Comments)    Other reaction(s): GI Upset (intolerance) Intensifies her Chron's  . Sulfonamide Derivatives Other (See Comments)    Hematemesis; documented while a young child  . Clarithromycin Other (See Comments)    Reacts with chron's disease  . Sulfamethoxazole Rash and Other (See Comments)    Internal bleeding and kidney infection  . Biaxin [Clarithromycin] Other (See Comments)    Cant take due to Chrons disease  . Codeine Nausea And Vomiting and Other (See Comments)    Dilaudid and demerol OK  . Hydrocodone Nausea And Vomiting  . Macrobid [Nitrofurantoin] Nausea And Vomiting    Other reaction(s): GI Upset (intolerance)  . Phenergan [Promethazine Hcl] Other (See Comments)    "muscle  twitches"  . Promethazine Rash and Other (See Comments)    Muscular seizures  . Promethazine Hcl Other (See Comments)    Muscular seizures  . Sulfa Antibiotics Hives and Other (See Comments)    Hematemesis; documented while a young child     Current Outpatient Prescriptions  Medication Sig Dispense Refill  . acetaminophen (TYLENOL) 500 MG tablet Take 1,000 mg by mouth every 6 (six) hours as needed for headache.    . albuterol (PROVENTIL HFA;VENTOLIN HFA) 108 (90 BASE) MCG/ACT inhaler Inhale 2 puffs into the lungs every 6 (six) hours as needed for wheezing or shortness of breath. 1 Inhaler 2  . cetirizine (ZYRTEC) 10 MG tablet Take 10 mg by mouth every evening.     . cyanocobalamin (,VITAMIN B-12,) 1000 MCG/ML injection     . dexlansoprazole (DEXILANT) 60 MG capsule Take 60 mg by mouth every evening.     . famotidine (PEPCID) 20-0.9 MG/50ML-% Inject 50 mLs (20 mg total) into the vein every 12 (twelve) hours. 50 mL 12  . Multiple Vitamin (MULTIVITAMIN) tablet Take 1 tablet by mouth daily.    . ondansetron (ZOFRAN) 4 MG/2ML SOLN injection Inject 2 mLs (4 mg total) into the vein every 6 (six) hours as needed for nausea or vomiting. 2 mL 0  . Prenatal Vit-Fe Fumarate-FA (PRENATAL PO) Take 2 tablets by mouth every evening. Prenatal gummy vitamin.    . Probiotic Product (PROBIOTIC PO) Take 2 tablets by mouth every evening. Probiotic gummy.    . sertraline (ZOLOFT) 50 MG tablet TAKE 1 TABLET BY MOUTH DAILY 30 tablet 5  . VITAMIN B1-B12 IJ Inject 1.5 mLs as  directed every 14 (fourteen) days.    Marland Kitchen zolpidem (AMBIEN) 10 MG tablet TAKE ONE TABLET BY MOUTH AT BEDTIME 30 tablet 0  . ALPRAZolam (XANAX) 0.25 MG tablet PRN    . butalbital-acetaminophen-caffeine (FIORICET, ESGIC) 50-325-40 MG per tablet Take 2 tablets by mouth 2 (two) times daily as needed for headache.     No current facility-administered medications for this visit.     Past Medical History  Diagnosis Date  . Crohn disease 2000  .  Asthma   . Endometriosis   . Dysautonomia     recurrent syncope s/p ILR by Dr Doreatha Lew  . Palpitations   . Anxiety   . GERD (gastroesophageal reflux disease)   . Vertigo   . Anginal pain only on 09/22/2014  . H/O hiatal hernia   . Chest pain     a. s/p intermediate risk nuclear stress test on 09/23/14 and normal LHC on 09/24/14   . Chronic nausea   . Migraine     ROS:   All systems reviewed and negative except as noted in the HPI.   Past Surgical History  Procedure Laterality Date  . Ileocecetomy  2002 X 2  . Loop recorder implant  11/2009    by DR Doreatha Lew  . Knee arthroscopy Bilateral 1988-1990  . Cesarean section  2011    emergency  CS due to placental abruption  . Partial colectomy  2002    partial removed due to crohns  . Appendectomy  ~ 1992  . Tonsillectomy  ~ 1996  . US echocardiography  11/11/2009    EF 55-60%  . Laparoscopy for ectopic pregnancy  11/2012  . Nasal sinus surgery  ~ 2007  . Endoscopic release transverse carpal ligament of hand Right 12/2010  . Colon surgery    . Cystoscopy w/ stone manipulation  X 2  . Cesarean section  2014    "preeclampsia"     Family History  Problem Relation Age of Onset  . Hypertension Mother   . Hyperlipidemia Mother   . Arthritis Mother   . Diabetes Father   . Coronary artery disease Father   . Heart disease Father 5    MI age 38  . Hyperlipidemia Brother   . Hypertension Brother      History   Social History  . Marital Status: Married    Spouse Name: Jonni Sanger    Number of Children: 2  . Years of Education: college   Occupational History  .  Other    Airline pilot  .  [place] History Main Topics  . Smoking status: Never Smoker   . Smokeless tobacco: Never Used  . Alcohol Use: Yes     Comment: 09/22/2014 "glass of wine maybe once/month"  . Drug Use: No  . Sexual Activity: Yes   Other Topics Concern  . Not on file   Social History Narrative   Patient lives at home with  her husband Jonni Sanger). Patient works full time for [employer].    Education [employer].   Right handed.   Caffeine one cup of coffee daily.     BP 110/80 mmHg  Pulse 76  Ht 5' 4"  (1.626 m)  Wt 145 lb (65.772 kg)  BMI 24.88 kg/m2  LMP 09/16/2014  Physical Exam:  Well appearing 38 year old woman, NAD HEENT: Unremarkable Neck:  No JVD, no thyromegally Lymphatics:  No adenopathy Back:  No CVA tenderness Lungs:  Clear with no wheezes, rales, or rhonchi. Indwelling  Port-A-Cath. HEART:  Regular rate rhythm, no murmurs, no rubs, no clicks Abd:  soft, positive bowel sounds, no organomegally, no rebound, no guarding Ext:  2 plus pulses, no edema, no cyanosis, no clubbing Skin:  No rashes no nodules Neuro:  CN II through XII intact, motor grossly intact  EKG - normal sinus rhythm  Cardiac monitor - sinus tachycardia  Assess/Plan:

## 2014-11-23 NOTE — Patient Instructions (Signed)
Your physician recommends that you schedule a follow-up appointment in: 6 weeks with Dr. Lovena Le

## 2014-11-23 NOTE — Assessment & Plan Note (Signed)
I have reviewed her cardiac monitor and find only sinus tachycardia. While the patient could have reentrant SVT, I suspect her palpitations are related to sinus tachycardia. She will undergo watchful waiting. No indication for medical therapy at this time.

## 2014-11-23 NOTE — Assessment & Plan Note (Signed)
Her chest pain is much improved. She underwent stress testing and catheterization approximately 2 months ago. No coronary disease was noted.

## 2014-11-23 NOTE — Assessment & Plan Note (Signed)
Long-term, the addition of Florinef and midodrine would be a strong consideration to help control her autonomic dysfunction and propensity for syncope. In addition, we will plan on removing her implantable loop recorder after delivery of her baby.

## 2014-11-23 NOTE — Assessment & Plan Note (Signed)
Her syncopal episodes have been quiet at present, but she is at increased risk for recurrent syncope secondary to volume depletion secondary to hyperemesis gravidarum. She is currently receiving a liter of lactated Ringer's daily, and is also trying to increase her fluid intake. I would suggest an attempt to double her fluid intake utilizing a second liter of lactated Ringer's if possible. I think this might help reduce the likelihood of syncope during pregnancy.

## 2014-12-01 NOTE — Discharge Summary (Signed)
Obstetric Discharge Summary Reason for Admission: observation/evaluation Prenatal Procedures: ultrasound Intrapartum Procedures: na Postpartum Procedures: na Complications-Operative and Postpartum: na HEMOGLOBIN  Date Value Ref Range Status  11/08/2014 11.0* 12.0 - 15.0 g/dL Final   HCT  Date Value Ref Range Status  11/08/2014 33.0* 36.0 - 46.0 % Final    Physical Exam:  General: alert and cooperative Lochia: na Uterine Fundus: gravid Incision: na DVT Evaluation: No evidence of DVT seen on physical exam. Negative Homan's sign. No cords or calf tenderness. No significant calf/ankle edema.  Discharge Diagnoses: Hyperemesis graviderum  Discharge Information: Date: 12/01/2014 Activity: unrestricted Diet: routine Medications: PNV and zofran pump Condition: improved Instructions: refer to practice specific booklet, has appointment in W/s later today. Discharge to: home   Newborn Data: This patient has no babies on file. Home with na.  Annabella Elford G 12/01/2014, 9:08 AM

## 2014-12-02 ENCOUNTER — Other Ambulatory Visit: Payer: Self-pay | Admitting: Family Medicine

## 2014-12-02 ENCOUNTER — Encounter (HOSPITAL_COMMUNITY): Payer: Self-pay | Admitting: Cardiology

## 2014-12-02 NOTE — Telephone Encounter (Signed)
Last office visit 11/02/2014 with Dr. Deborra Medina for dysuria.  Patient is pregnant. Ok to refill?

## 2014-12-03 NOTE — Telephone Encounter (Signed)
Called to [facility].

## 2014-12-22 ENCOUNTER — Other Ambulatory Visit: Payer: Self-pay | Admitting: Family Medicine

## 2014-12-22 NOTE — Telephone Encounter (Signed)
Last filled 11/22/14

## 2014-12-27 NOTE — Telephone Encounter (Signed)
Phoned in to pharmacy.

## 2015-01-04 ENCOUNTER — Encounter: Payer: Self-pay | Admitting: Internal Medicine

## 2015-01-04 ENCOUNTER — Ambulatory Visit (INDEPENDENT_AMBULATORY_CARE_PROVIDER_SITE_OTHER): Payer: BC Managed Care – PPO | Admitting: Internal Medicine

## 2015-01-04 VITALS — BP 102/68 | Ht 64.0 in | Wt 146.0 lb

## 2015-01-04 DIAGNOSIS — R55 Syncope and collapse: Secondary | ICD-10-CM

## 2015-01-04 DIAGNOSIS — R110 Nausea: Secondary | ICD-10-CM

## 2015-01-04 DIAGNOSIS — O26812 Pregnancy related exhaustion and fatigue, second trimester: Secondary | ICD-10-CM

## 2015-01-04 NOTE — Assessment & Plan Note (Signed)
Her symptoms are reasonably well controlled with IV Reglan. She still has periods of nausea. Ill defer additional treatment rec's to her OB.

## 2015-01-04 NOTE — Assessment & Plan Note (Signed)
This is her biggest complaint. I have advised the patient that she may well require bed rest during the second half of her pregnancy.

## 2015-01-04 NOTE — Patient Instructions (Signed)
Your physician recommends that you schedule a follow-up appointment in: 8 weeks with Dr. Lovena Le

## 2015-01-04 NOTE — Progress Notes (Signed)
HPI [name] returns today for followup of autonomic dysfunction. She is a very pleasant 39 year old woman with a history of autonomic dysfunction, who is also known to have hyperemesis gravidarum. She is currently in her second trimester of pregnancy, her third child. The patient had severe hyperemesis during both of her pregnancies. Over the years, she has been able to control her autonomic dysfunction fairly well. She is currently receiving intravenous therapy for nausea by way of her Port-A-Cath, and is also receiving two liters of lactated Ringer's over a 24 hour period. Despite medical therapy, she has had a very difficult time keeping any food down. She's had no recent syncopal episodes. She continues to try and work, Photographer in the fifth grade. The patient previously had an implantable loop recorder placed which is now past end-of-life and cannot be communicated with. She has worn a cardiac monitor which demonstrated sinus tachycardia. She denies chest pain or peripheral edema. Of note, the patient developed preeclampsia during her second delivery. Allergies  Allergen Reactions  . Compazine [Prochlorperazine Edisylate] Anaphylaxis  . Reglan [Metoclopramide] Anaphylaxis and Other (See Comments)    Other reaction(s): GI Upset (intolerance) Intensifies her Chron's  . Sulfonamide Derivatives Other (See Comments)    Hematemesis; documented while a young child  . Clarithromycin Other (See Comments)    Reacts with chron's disease  . Sulfamethoxazole Rash and Other (See Comments)    Internal bleeding and kidney infection  . Biaxin [Clarithromycin] Other (See Comments)    Cant take due to Chrons disease  . Codeine Nausea And Vomiting and Other (See Comments)    Dilaudid and demerol OK  . Hydrocodone Nausea And Vomiting  . Macrobid [Nitrofurantoin] Nausea And Vomiting    Other reaction(s): GI Upset (intolerance)  . Phenergan [Promethazine Hcl] Other (See Comments)   "muscle twitches"  . Promethazine Rash and Other (See Comments)    Muscular seizures  . Promethazine Hcl Other (See Comments)    Muscular seizures  . Sulfa Antibiotics Hives and Other (See Comments)    Hematemesis; documented while a young child     Current Outpatient Prescriptions  Medication Sig Dispense Refill  . acetaminophen (TYLENOL) 500 MG tablet Take 1,000 mg by mouth every 6 (six) hours as needed for headache.    . albuterol (PROVENTIL HFA;VENTOLIN HFA) 108 (90 BASE) MCG/ACT inhaler Inhale 2 puffs into the lungs every 6 (six) hours as needed for wheezing or shortness of breath. 1 Inhaler 2  . butalbital-acetaminophen-caffeine (FIORICET, ESGIC) 50-325-40 MG per tablet TAKE 2 TABLETS EVERY 6 HOURS AS NEEDED (Patient taking differently: TAKE 2 TABLETS BY MOUTH EVERY 6 HOURS AS NEEDED FOR PAIN) 20 tablet 1  . cetirizine (ZYRTEC) 10 MG tablet Take 10 mg by mouth every evening.     . cyanocobalamin (,VITAMIN B-12,) 1000 MCG/ML injection Inject 1,000 mcg into the muscle every 14 (fourteen) days.     Marland Kitchen dexlansoprazole (DEXILANT) 60 MG capsule Take 60 mg by mouth every evening.     . famotidine (PEPCID) 20-0.9 MG/50ML-% Inject 50 mLs (20 mg total) into the vein every 12 (twelve) hours. 50 mL 12  . Multiple Vitamin (MULTIVITAMIN) tablet Take 1 tablet by mouth daily.    . ondansetron (ZOFRAN) 4 MG/2ML SOLN injection Inject 2 mLs (4 mg total) into the vein every 6 (six) hours as needed for nausea or vomiting. (Patient taking differently: Continuous pump) 2 mL 0  . Prenatal Vit-Fe Fumarate-FA (PRENATAL PO) Take 2 tablets by  mouth every evening. Prenatal gummy vitamin.    . Probiotic Product (PROBIOTIC PO) Take 2 tablets by mouth every evening. Probiotic gummy.    . sertraline (ZOLOFT) 50 MG tablet TAKE 1 TABLET BY MOUTH DAILY 30 tablet 5  . zolpidem (AMBIEN) 10 MG tablet TAKE ONE TABLET BY MOUTH AT BEDTIME 30 tablet 0  . ALPRAZolam (XANAX) 0.25 MG tablet Take 1 tablet by mouth daily as needed  for anxiety     No current facility-administered medications for this visit.     Past Medical History  Diagnosis Date  . Crohn disease 2000  . Asthma   . Endometriosis   . Dysautonomia     recurrent syncope s/p ILR by Dr Doreatha Lew  . Palpitations   . Anxiety   . GERD (gastroesophageal reflux disease)   . Vertigo   . Anginal pain only on 09/22/2014  . H/O hiatal hernia   . Chest pain     a. s/p intermediate risk nuclear stress test on 09/23/14 and normal LHC on 09/24/14   . Chronic nausea   . Migraine     ROS:   All systems reviewed and negative except as noted in the HPI.   Past Surgical History  Procedure Laterality Date  . Ileocecetomy  2002 X 2  . Loop recorder implant  11/2009    by DR Doreatha Lew  . Knee arthroscopy Bilateral 1988-1990  . Cesarean section  2011    emergency  CS due to placental abruption  . Partial colectomy  2002    partial removed due to crohns  . Appendectomy  ~ 1992  . Tonsillectomy  ~ 1996  . US echocardiography  11/11/2009    EF 55-60%  . Laparoscopy for ectopic pregnancy  11/2012  . Nasal sinus surgery  ~ 2007  . Endoscopic release transverse carpal ligament of hand Right 12/2010  . Colon surgery    . Cystoscopy w/ stone manipulation  X 2  . Cesarean section  2014    "preeclampsia"  . Left heart catheterization with coronary angiogram N/A 09/24/2014    Procedure: LEFT HEART CATHETERIZATION WITH CORONARY ANGIOGRAM;  Surgeon: Leonie Man, MD;  Location[facility] CATH LAB;  Service: Cardiovascular;  Laterality: N/A;     Family History  Problem Relation Age of Onset  . Hypertension Mother   . Hyperlipidemia Mother   . Arthritis Mother   . Diabetes Father   . Coronary artery disease Father   . Heart disease Father 62    MI age 30  . Hyperlipidemia Brother   . Hypertension Brother      History   Social History  . Marital Status: Married    Spouse Name: Jonni Sanger    Number of Children: 2  . Years of Education: college   Occupational  History  .  Other    Airline pilot  .  [place] History Main Topics  . Smoking status: Never Smoker   . Smokeless tobacco: Never Used  . Alcohol Use: Yes     Comment: 09/22/2014 "glass of wine maybe once/month"  . Drug Use: No  . Sexual Activity: Yes   Other Topics Concern  . Not on file   Social History Narrative   Patient lives at home with her husband Jonni Sanger). Patient works full time for [employer].    Education [employer].   Right handed.   Caffeine one cup of coffee daily.     BP 102/68 mmHg  Ht  5' 4"  (1.626 m)  Wt 146 lb (66.225 kg)  BMI 25.05 kg/m2  LMP 09/16/2014  Physical Exam:  Well appearing 39 year old woman, NAD HEENT: Unremarkable Neck:  6 cm JVD, no thyromegally Lymphatics:  No adenopathy Back:  No CVA tenderness Lungs:  Clear with no wheezes, rales, or rhonchi. Indwelling Port-A-Cath. HEART:  Regular rate rhythm, no murmurs, no rubs, no clicks Abd:  soft, positive bowel sounds, no organomegally, no rebound, no guarding Ext:  2 plus pulses, no edema, no cyanosis, no clubbing Skin:  No rashes no nodules Neuro:  CN II through XII intact, motor grossly intact  Cardiac monitor - sinus tachycardia  Assess/Plan:

## 2015-01-04 NOTE — Assessment & Plan Note (Signed)
Her symptoms are fairly well controlled. We discussed strategies for obtaining more sodium and fluid in the setting of her hyperemesis.

## 2015-01-19 ENCOUNTER — Other Ambulatory Visit: Payer: Self-pay | Admitting: Family Medicine

## 2015-01-19 NOTE — Telephone Encounter (Signed)
Last office visit 11/02/2014 with Dr. Deborra Medina.  Last refilled 12/23/2014 for #30 with no refills.  Ok to refill?

## 2015-01-20 NOTE — Telephone Encounter (Signed)
Called to [facility].

## 2015-01-27 ENCOUNTER — Encounter: Payer: Self-pay | Admitting: Internal Medicine

## 2015-02-21 ENCOUNTER — Other Ambulatory Visit: Payer: Self-pay | Admitting: Family Medicine

## 2015-02-22 NOTE — Telephone Encounter (Signed)
Last office visit 11/02/2014 with Dr. Deborra Medina.  Last refilled 01/20/2015 for #30.  Ok to refill?

## 2015-02-22 NOTE — Telephone Encounter (Signed)
Called to [place]

## 2015-03-01 ENCOUNTER — Encounter: Payer: BC Managed Care – PPO | Admitting: Internal Medicine

## 2015-03-20 ENCOUNTER — Encounter (HOSPITAL_COMMUNITY): Payer: Self-pay

## 2015-03-20 ENCOUNTER — Inpatient Hospital Stay (HOSPITAL_COMMUNITY)
Admission: AD | Admit: 2015-03-20 | Discharge: 2015-03-20 | Disposition: A | Discharge status: Home / Self Care | Payer: BC Managed Care – PPO | Source: Ambulatory Visit | Attending: Obstetrics & Gynecology | Admitting: Obstetrics & Gynecology

## 2015-03-20 DIAGNOSIS — Z882 Allergy status to sulfonamides status: Secondary | ICD-10-CM | POA: Diagnosis not present

## 2015-03-20 DIAGNOSIS — F419 Anxiety disorder, unspecified: Secondary | ICD-10-CM | POA: Diagnosis not present

## 2015-03-20 DIAGNOSIS — O9989 Other specified diseases and conditions complicating pregnancy, childbirth and the puerperium: Secondary | ICD-10-CM | POA: Insufficient documentation

## 2015-03-20 DIAGNOSIS — J45909 Unspecified asthma, uncomplicated: Secondary | ICD-10-CM | POA: Diagnosis not present

## 2015-03-20 DIAGNOSIS — O99512 Diseases of the respiratory system complicating pregnancy, second trimester: Secondary | ICD-10-CM | POA: Insufficient documentation

## 2015-03-20 DIAGNOSIS — R109 Unspecified abdominal pain: Secondary | ICD-10-CM

## 2015-03-20 DIAGNOSIS — Z79899 Other long term (current) drug therapy: Secondary | ICD-10-CM | POA: Diagnosis not present

## 2015-03-20 DIAGNOSIS — R42 Dizziness and giddiness: Secondary | ICD-10-CM | POA: Diagnosis not present

## 2015-03-20 DIAGNOSIS — Z3A26 26 weeks gestation of pregnancy: Secondary | ICD-10-CM | POA: Insufficient documentation

## 2015-03-20 DIAGNOSIS — R1012 Left upper quadrant pain: Secondary | ICD-10-CM | POA: Insufficient documentation

## 2015-03-20 DIAGNOSIS — O99612 Diseases of the digestive system complicating pregnancy, second trimester: Secondary | ICD-10-CM | POA: Diagnosis not present

## 2015-03-20 DIAGNOSIS — O99342 Other mental disorders complicating pregnancy, second trimester: Secondary | ICD-10-CM | POA: Insufficient documentation

## 2015-03-20 DIAGNOSIS — K219 Gastro-esophageal reflux disease without esophagitis: Secondary | ICD-10-CM | POA: Diagnosis not present

## 2015-03-20 DIAGNOSIS — R1032 Left lower quadrant pain: Secondary | ICD-10-CM | POA: Diagnosis not present

## 2015-03-20 DIAGNOSIS — O26899 Other specified pregnancy related conditions, unspecified trimester: Secondary | ICD-10-CM

## 2015-03-20 LAB — URINALYSIS, ROUTINE W REFLEX MICROSCOPIC
Bilirubin Urine: NEGATIVE
Glucose, UA: NEGATIVE mg/dL
Hgb urine dipstick: NEGATIVE
Ketones, ur: NEGATIVE mg/dL
Leukocytes, UA: NEGATIVE
Nitrite: NEGATIVE
PROTEIN: NEGATIVE mg/dL
Specific Gravity, Urine: >1.03 — ABNORMAL HIGH (ref 1.005–1.030)
Urobilinogen, UA: 0.2 mg/dL (ref 0.0–1.0)
pH: 6 (ref 5.0–8.0)

## 2015-03-20 NOTE — MAU Note (Signed)
Pt presents complaining of lightheadedness for 2 weeks due to low blood pressure. States she had a sharp shooting pain one time this am and vaginal pressure since then. Denies vaginal bleeding or discharge. Reports good fetal movement. Denies pain. States she feels uncomfortable

## 2015-03-20 NOTE — Discharge Instructions (Signed)
Abdominal Pain During Pregnancy Abdominal pain is common in pregnancy. Most of the time, it does not cause harm. There are many causes of abdominal pain. Some causes are more serious than others. Some of the causes of abdominal pain in pregnancy are easily diagnosed. Occasionally, the diagnosis takes time to understand. Other times, the cause is not determined. Abdominal pain can be a sign that something is very wrong with the pregnancy, or the pain may have nothing to do with the pregnancy at all. For this reason, always tell your health care provider if you have any abdominal discomfort. HOME CARE INSTRUCTIONS  Monitor your abdominal pain for any changes. The following actions may help to alleviate any discomfort you are experiencing:  Do not have sexual intercourse or put anything in your vagina until your symptoms go away completely.  Get plenty of rest until your pain improves.  Drink clear fluids if you feel nauseous. Avoid solid food as long as you are uncomfortable or nauseous.  Only take over-the-counter or prescription medicine as directed by your health care provider.  Keep all follow-up appointments with your health care provider. SEEK IMMEDIATE MEDICAL CARE IF:  You are bleeding, leaking fluid, or passing tissue from the vagina.  You have increasing pain or cramping.  You have persistent vomiting.  You have painful or bloody urination.  You have a fever.  You notice a decrease in your baby's movements.  You have extreme weakness or feel faint.  You have shortness of breath, with or without abdominal pain.  You develop a severe headache with abdominal pain.  You have abnormal vaginal discharge with abdominal pain.  You have persistent diarrhea.  You have abdominal pain that continues even after rest, or gets worse. MAKE SURE YOU:   Understand these instructions.  Will watch your condition.  Will get help right away if you are not doing well or get  worse. Document Released: 12/10/2005 Document Revised: 09/30/2013 Document Reviewed: 07/09/2013 [facility] Patient Information 2015 [place], [place]. This information is not intended to replace advice given to you by your health care provider. Make sure you discuss any questions you have with your health care provider. Dizziness Dizziness is a common problem. It is a feeling of unsteadiness or light-headedness. You may feel like you are about to faint. Dizziness can lead to injury if you stumble or fall. A person of any age group can suffer from dizziness, but dizziness is more common in older adults. CAUSES  Dizziness can be caused by many different things, including:  Middle ear problems.  Standing for too long.  Infections.  An allergic reaction.  Aging.  An emotional response to something, such as the sight of blood.  Side effects of medicines.  Tiredness.  Problems with circulation or blood pressure.  Excessive use of alcohol or medicines, or illegal drug use.  Breathing too fast (hyperventilation).  An irregular heart rhythm (arrhythmia).  A low red blood cell count (anemia).  Pregnancy.  Vomiting, diarrhea, fever, or other illnesses that cause body fluid loss (dehydration).  Diseases or conditions such as Parkinson's disease, high blood pressure (hypertension), diabetes, and thyroid problems.  Exposure to extreme heat. DIAGNOSIS  Your health care provider will ask about your symptoms, perform a physical exam, and perform an electrocardiogram (ECG) to record the electrical activity of your heart. Your health care provider may also perform other heart or blood tests to determine the cause of your dizziness. These may include:  Transthoracic echocardiogram (TTE). During echocardiography, sound  waves are used to evaluate how blood flows through your heart.  Transesophageal echocardiogram (TEE).  Cardiac monitoring. This allows your health care provider to monitor your  heart rate and rhythm in real time.  Holter monitor. This is a portable device that records your heartbeat and can help diagnose heart arrhythmias. It allows your health care provider to track your heart activity for several days if needed.  Stress tests by exercise or by giving medicine that makes the heart beat faster. TREATMENT  Treatment of dizziness depends on the cause of your symptoms and can vary greatly. HOME CARE INSTRUCTIONS   Drink enough fluids to keep your urine clear or pale yellow. This is especially important in very hot weather. In older adults, it is also important in cold weather.  Take your medicine exactly as directed if your dizziness is caused by medicines. When taking blood pressure medicines, it is especially important to get up slowly.  Rise slowly from chairs and steady yourself until you feel okay.  In the morning, first sit up on the side of the bed. When you feel okay, stand slowly while holding onto something until you know your balance is fine.  Move your legs often if you need to stand in one place for a long time. Tighten and relax your muscles in your legs while standing.  Have someone stay with you for 1-2 days if dizziness continues to be a problem. Do this until you feel you are well enough to stay alone. Have the person call your health care provider if he or she notices changes in you that are concerning.  Do not drive or use heavy machinery if you feel dizzy.  Do not drink alcohol. SEEK IMMEDIATE MEDICAL CARE IF:   Your dizziness or light-headedness gets worse.  You feel nauseous or vomit.  You have problems talking, walking, or using your arms, hands, or legs.  You feel weak.  You are not thinking clearly or you have trouble forming sentences. It may take a friend or family member to notice this.  You have chest pain, abdominal pain, shortness of breath, or sweating.  Your vision changes.  You notice any bleeding.  You have side  effects from medicine that seems to be getting worse rather than better. MAKE SURE YOU:   Understand these instructions.  Will watch your condition.  Will get help right away if you are not doing well or get worse. Document Released: 06/05/2001 Document Revised: 12/15/2013 Document Reviewed: 06/29/2011 [facility] Patient Information 2015 [place], [place]. This information is not intended to replace advice given to you by your health care provider. Make sure you discuss any questions you have with your health care provider.

## 2015-03-20 NOTE — MAU Provider Note (Signed)
History     CSN: [id number]  Arrival date and time: 03/20/15 1757   None     Chief Complaint  Patient presents with  . Contractions   Abdominal Pain This is a new problem. The current episode started today. The onset quality is sudden. Duration: 5 minutes constant pain in the shower. The problem has been resolved. The pain is located in the LLQ. The pain is at a severity of 10/10. The pain is severe. The quality of the pain is sharp. The abdominal pain does not radiate.  Abdominal Cramping This is a chronic problem. The current episode started 1 to 4 weeks ago. The onset quality is gradual. The problem occurs intermittently. The most recent episode lasted 2 weeks. The problem has been resolved. The pain is located in the LUQ and LLQ. The pain is at a severity of 4/10. The pain is mild. The quality of the pain is cramping. The abdominal pain does not radiate.   39 y.o. [id number]@[redacted]w[redacted]d    presents complaining of lightheadedness for 2 weeks due to low blood pressure. States she had a sharp shooting pain one time this am and vaginal pressure since then. Denies vaginal bleeding or discharge., LOF,  Denies contractions Reports good fetal movement. Denies pain. States she feels uncomfortable               Past Medical History  Diagnosis Date  . Crohn disease 2000  . Asthma   . Endometriosis   . Dysautonomia     recurrent syncope s/p ILR by Dr Doreatha Lew  . Palpitations   . Anxiety   . GERD (gastroesophageal reflux disease)   . Vertigo   . Anginal pain only on 09/22/2014  . H/O hiatal hernia   . Chest pain     a. s/p intermediate risk nuclear stress test on 09/23/14 and normal LHC on 09/24/14   . Chronic nausea   . Migraine     Past Surgical History  Procedure Laterality Date  . Ileocecetomy  2002 X 2  . Loop recorder implant  11/2009    by DR Doreatha Lew  . Knee arthroscopy Bilateral 1988-1990  . Cesarean section  2011    emergency  CS due to placental abruption  . Partial  colectomy  2002    partial removed due to crohns  . Appendectomy  ~ 1992  . Tonsillectomy  ~ 1996  . US echocardiography  11/11/2009    EF 55-60%  . Laparoscopy for ectopic pregnancy  11/2012  . Nasal sinus surgery  ~ 2007  . Endoscopic release transverse carpal ligament of hand Right 12/2010  . Colon surgery    . Cystoscopy w/ stone manipulation  X 2  . Cesarean section  2014    "preeclampsia"  . Left heart catheterization with coronary angiogram N/A 09/24/2014    Procedure: LEFT HEART CATHETERIZATION WITH CORONARY ANGIOGRAM;  Surgeon: Leonie Man, MD;  Location: [facility] CATH LAB;  Service: Cardiovascular;  Laterality: N/A;    Family History  Problem Relation Age of Onset  . Hypertension Mother   . Hyperlipidemia Mother   . Arthritis Mother   . Diabetes Father   . Coronary artery disease Father   . Heart disease Father 14    MI age 57  . Hyperlipidemia Brother   . Hypertension Brother     History  Substance Use Topics  . Smoking status: Never Smoker   . Smokeless tobacco: Never Used  . Alcohol Use: Yes  Comment: 09/22/2014 "glass of wine maybe once/month"    Allergies:  Allergies  Allergen Reactions  . Compazine [Prochlorperazine Edisylate] Anaphylaxis  . Reglan [Metoclopramide] Anaphylaxis and Other (See Comments)    Other reaction(s): GI Upset (intolerance) Intensifies her Chron's  . Sulfonamide Derivatives Other (See Comments)    Hematemesis; documented while a young child  . Clarithromycin Other (See Comments)    Reacts with chron's disease  . Sulfamethoxazole Rash and Other (See Comments)    Internal bleeding and kidney infection  . Biaxin [Clarithromycin] Other (See Comments)    Cant take due to Chrons disease  . Codeine Nausea And Vomiting and Other (See Comments)    Dilaudid and demerol OK  . Hydrocodone Nausea And Vomiting  . Macrobid [Nitrofurantoin] Nausea And Vomiting    Other reaction(s): GI Upset (intolerance)  . Phenergan [Promethazine Hcl]  Other (See Comments)    "muscle twitches"  . Promethazine Rash and Other (See Comments)    Muscular seizures  . Promethazine Hcl Other (See Comments)    Muscular seizures  . Sulfa Antibiotics Hives and Other (See Comments)    Hematemesis; documented while a young child    Prescriptions prior to admission  Medication Sig Dispense Refill Last Dose  . acetaminophen (TYLENOL) 500 MG tablet Take 1,000 mg by mouth every 6 (six) hours as needed for headache.   Taking  . albuterol (PROVENTIL HFA;VENTOLIN HFA) 108 (90 BASE) MCG/ACT inhaler Inhale 2 puffs into the lungs every 6 (six) hours as needed for wheezing or shortness of breath. 1 Inhaler 2 Taking  . ALPRAZolam (XANAX) 0.25 MG tablet Take 1 tablet by mouth daily as needed for anxiety   Not Taking  . butalbital-acetaminophen-caffeine (FIORICET, ESGIC) 50-325-40 MG per tablet TAKE 2 TABLETS EVERY 6 HOURS AS NEEDED (Patient taking differently: TAKE 2 TABLETS BY MOUTH EVERY 6 HOURS AS NEEDED FOR PAIN) 20 tablet 1 Taking  . cetirizine (ZYRTEC) 10 MG tablet Take 10 mg by mouth every evening.    Taking  . cyanocobalamin (,VITAMIN B-12,) 1000 MCG/ML injection Inject 1,000 mcg into the muscle every 14 (fourteen) days.    Taking  . dexlansoprazole (DEXILANT) 60 MG capsule Take 60 mg by mouth every evening.    Taking  . famotidine (PEPCID) 20-0.9 MG/50ML-% Inject 50 mLs (20 mg total) into the vein every 12 (twelve) hours. 50 mL 12 Taking  . Multiple Vitamin (MULTIVITAMIN) tablet Take 1 tablet by mouth daily.   Taking  . ondansetron (ZOFRAN) 4 MG/2ML SOLN injection Inject 2 mLs (4 mg total) into the vein every 6 (six) hours as needed for nausea or vomiting. (Patient taking differently: Continuous pump) 2 mL 0 Taking  . Prenatal Vit-Fe Fumarate-FA (PRENATAL PO) Take 2 tablets by mouth every evening. Prenatal gummy vitamin.   Taking  . Probiotic Product (PROBIOTIC PO) Take 2 tablets by mouth every evening. Probiotic gummy.   Taking  . sertraline (ZOLOFT) 50  MG tablet TAKE 1 TABLET BY MOUTH DAILY 30 tablet 5 Taking  . zolpidem (AMBIEN) 10 MG tablet TAKE ONE TABLET BY MOUTH EVERY NIGHT AT BEDTIME 30 tablet 0     Review of Systems  Constitutional: Negative.   HENT: Negative.   Eyes: Negative.   Respiratory: Negative.   Cardiovascular: Negative.   Gastrointestinal: Positive for abdominal pain.  Genitourinary: Negative.   Musculoskeletal: Negative.   Skin: Negative.   Neurological: Positive for dizziness.  Endo/Heme/Allergies: Negative.   Psychiatric/Behavioral: Negative.    Physical Exam   Blood pressure 107/76, pulse  93, temperature 98.4 F (36.9 C), temperature source Oral, resp. rate 18, last menstrual period 09/16/2014.  Physical Exam  Nursing note and vitals reviewed. Constitutional: She is oriented to person, place, and time. She appears well-developed and well-nourished. No distress.  HENT:  Head: Normocephalic and atraumatic.  Neck: Normal range of motion.  Cardiovascular: Normal rate and regular rhythm.   Respiratory: Effort normal. No respiratory distress.  GI: Soft. She exhibits no distension and no mass. There is no tenderness. There is no rebound and no guarding.  Musculoskeletal: Normal range of motion.  Neurological: She is alert and oriented to person, place, and time.  Skin: Skin is warm and dry.  Psychiatric: She has a normal mood and affect. Her behavior is normal. Judgment and thought content normal.  BP 107/76 mmHg  Pulse 93  Temp(Src) 98.4 F (36.9 C) (Oral)  Resp 18  LMP 09/16/2014 blood pressure  FHT's 155 Category 1  Results for orders placed or performed during the hospital encounter of 03/20/15 (from the past 24 hour(s))  Urinalysis, Routine w reflex microscopic     Status: Abnormal   Collection Time: 03/20/15  6:05 PM  Result Value Ref Range   Color, Urine YELLOW YELLOW   APPearance CLEAR CLEAR   Specific Gravity, Urine >1.030 (H) 1.005 - 1.030   pH 6.0 5.0 - 8.0   Glucose, UA NEGATIVE  NEGATIVE mg/dL   Hgb urine dipstick NEGATIVE NEGATIVE   Bilirubin Urine NEGATIVE NEGATIVE   Ketones, ur NEGATIVE NEGATIVE mg/dL   Protein, ur NEGATIVE NEGATIVE mg/dL   Urobilinogen, UA 0.2 0.0 - 1.0 mg/dL   Nitrite NEGATIVE NEGATIVE   Leukocytes, UA NEGATIVE NEGATIVE    MAU Course  Procedures  MDM EFM NST- Reactive Spoke with Dr Lynnette Caffey re POC  Assessment and Plan  Abdominal Pain in Pregnancy Lightheadedness  D/C to home Return to MAU prn RTO at next regularly scheduled appointment.    Clemmons,Lori Grissett 03/20/2015, 6:37 PM

## 2015-03-22 ENCOUNTER — Telehealth: Payer: Self-pay | Admitting: Internal Medicine

## 2015-03-22 NOTE — Telephone Encounter (Signed)
New problem   Pt is having lightheadedness and need to speak to nurse.

## 2015-03-22 NOTE — Telephone Encounter (Signed)
26 weeks preg Having severe episodes of dizziness  Getting IV's via porta cath  Any suggestions from

## 2015-03-23 ENCOUNTER — Other Ambulatory Visit: Payer: Self-pay | Admitting: Family Medicine

## 2015-03-23 NOTE — Telephone Encounter (Signed)
Last office visit 11/02/2014 with Dr. Deborra Medina.  Ambien no longer on her medication list.  Ok to refill?

## 2015-03-24 NOTE — Telephone Encounter (Signed)
Called to [facility].

## 2015-03-24 NOTE — Telephone Encounter (Signed)
Discussed with Dr Caryl Comes and he says that during pregnancy dysautonomia is worse.  He advises to increase salt intake, fluids, and wear support stockings.  Unfortunately the 3rd trimester is the worst per Dr Caryl Comes.  I have left her a message and she will call me back with any questions

## 2015-04-06 ENCOUNTER — Telehealth: Payer: Self-pay | Admitting: Internal Medicine

## 2015-04-06 NOTE — Telephone Encounter (Signed)
Patient requesting to see Dr. Lovena Le as soon as possible. She says that she has very complicated questions and she feels like he is the best person to address them, so she declines OV with PA/NP at this time. Will route message to Dr. Lovena Le to determine if there is any available openings on his schedule within the next two weeks.

## 2015-04-06 NOTE — Telephone Encounter (Signed)
New Message       Pt calling stating she was in the hosp due to her heart condition and was told she needs to f/u w/ Dr. Lovena Le asap and she is pregnant and needs to be sooner than May. Please call back and advise.

## 2015-04-27 ENCOUNTER — Other Ambulatory Visit: Payer: Self-pay | Admitting: Family Medicine

## 2015-04-28 NOTE — Telephone Encounter (Signed)
Last office visit 11/02/2014 with Dr. Deborra Medina.  Last refilled 03/24/2015 for #30 with no refills.  Ok to refill?

## 2015-04-29 NOTE — Telephone Encounter (Signed)
Called in to [facility].

## 2015-05-09 ENCOUNTER — Encounter (HOSPITAL_COMMUNITY): Payer: Self-pay

## 2015-05-09 ENCOUNTER — Ambulatory Visit (HOSPITAL_COMMUNITY)
Admission: RE | Admit: 2015-05-09 | Discharge: 2015-05-09 | Disposition: A | Discharge status: Home / Self Care | Payer: BC Managed Care – PPO | Source: Ambulatory Visit | Attending: Maternal and Fetal Medicine | Admitting: Maternal and Fetal Medicine

## 2015-05-09 ENCOUNTER — Other Ambulatory Visit: Payer: Self-pay | Admitting: Family Medicine

## 2015-05-09 DIAGNOSIS — Z3A33 33 weeks gestation of pregnancy: Secondary | ICD-10-CM | POA: Insufficient documentation

## 2015-05-09 DIAGNOSIS — O9989 Other specified diseases and conditions complicating pregnancy, childbirth and the puerperium: Secondary | ICD-10-CM | POA: Diagnosis present

## 2015-05-09 DIAGNOSIS — K5090 Crohn's disease, unspecified, without complications: Secondary | ICD-10-CM | POA: Insufficient documentation

## 2015-05-09 HISTORY — DX: Thromboembolism in pregnancy, unspecified trimester: O88.219

## 2015-05-09 NOTE — Progress Notes (Signed)
NST note  39 yo [id number] currently at 33w 2d- hx of Crohn's disease, PE during pregnancy and C-section x 2  BP 114/73, 101, 156 NST reactive Rare uterine contractions  Continue antenatal testing as scheduled.  Benjaman Lobe, MD

## 2015-05-11 ENCOUNTER — Encounter: Payer: Self-pay | Admitting: Internal Medicine

## 2015-05-11 ENCOUNTER — Ambulatory Visit (INDEPENDENT_AMBULATORY_CARE_PROVIDER_SITE_OTHER): Payer: BC Managed Care – PPO | Admitting: Internal Medicine

## 2015-05-11 VITALS — BP 100/68 | HR 100 | Ht 64.0 in | Wt 155.0 lb

## 2015-05-11 DIAGNOSIS — R002 Palpitations: Secondary | ICD-10-CM

## 2015-05-11 DIAGNOSIS — I2699 Other pulmonary embolism without acute cor pulmonale: Secondary | ICD-10-CM | POA: Insufficient documentation

## 2015-05-11 DIAGNOSIS — R55 Syncope and collapse: Secondary | ICD-10-CM

## 2015-05-11 NOTE — Assessment & Plan Note (Signed)
She is managing reasonably well with all that has happend to her. I have asked that she make a concerted effort to eat and drink lots of salt and fluid once she has delivered.

## 2015-05-11 NOTE — Assessment & Plan Note (Signed)
She has had several near syncopal episodes but no frank syncope. Will follow.

## 2015-05-11 NOTE — Patient Instructions (Signed)
Medication Instructions:  Your physician recommends that you continue on your current medications as directed. Please refer to the Current Medication list given to you today.    Labwork: none  Testing/Procedures: none  Follow-Up: Your physician recommends that you schedule a follow-up appointment in: 4 months. --Scheduled for September 14, 2015 at 4:30

## 2015-05-11 NOTE — Assessment & Plan Note (Signed)
Her symptoms are controlled reasonably well and most likely due to autonomic dysfunction. Will follow.

## 2015-05-11 NOTE — Assessment & Plan Note (Signed)
She is now on Lovenox. He dyspnea is improved and her pleuritic chest pain has resolved. She will need to be transitioned to a NOAC after delivery.

## 2015-05-11 NOTE — Progress Notes (Signed)
HPI [name] returns today for followup of autonomic dysfunction. She is a very pleasant 39 year old woman with a history of autonomic dysfunction, who is also known to have hyperemesis gravidarum. She is currently in her third trimester of pregnancy, her third child. The patient had severe hyperemesis during both of her pregnancies. Over the years, she has been able to control her autonomic dysfunction fairly well. She is currently receiving intravenous therapy for nausea by way of her PICC line after she was found to have developed bilateral pulmonary emboli and a staph infection for which she received IV Vancomycin. She is receiving a liter of lactated Ringer's over a 24 hour period. Despite medical therapy, she has had a very difficult time keeping any food down. She denies chest pain or peripheral edema. Of note, the patient developed preeclampsia during her second delivery.   Allergies  Allergen Reactions  . Compazine [Prochlorperazine Edisylate] Anaphylaxis  . Reglan [Metoclopramide] Anaphylaxis and Other (See Comments)    Other reaction(s): GI Upset (intolerance) Intensifies her Chron's  . Sulfonamide Derivatives Other (See Comments)    Hematemesis; documented while a young child  . Clarithromycin Other (See Comments)    Reacts with chron's disease  . Sulfamethoxazole Rash and Other (See Comments)    Internal bleeding and kidney infection  . Biaxin [Clarithromycin] Other (See Comments)    Cant take due to Chrons disease  . Codeine Nausea And Vomiting and Other (See Comments)    Dilaudid and demerol OK  . Hydrocodone Nausea And Vomiting  . Macrobid [Nitrofurantoin] Nausea And Vomiting    Other reaction(s): GI Upset (intolerance)  . Phenergan [Promethazine Hcl] Other (See Comments)    "muscle twitches"  . Promethazine Rash and Other (See Comments)    Muscular seizures  . Promethazine Hcl Other (See Comments)    Muscular seizures  . Sulfa Antibiotics Hives and Other (See  Comments)    Hematemesis; documented while a young child     Current Outpatient Prescriptions  Medication Sig Dispense Refill  . acetaminophen (TYLENOL) 500 MG tablet Take 1,000 mg by mouth every 6 (six) hours as needed for headache.    . albuterol (PROVENTIL HFA;VENTOLIN HFA) 108 (90 BASE) MCG/ACT inhaler Inhale 2 puffs into the lungs every 6 (six) hours as needed for wheezing or shortness of breath. 1 Inhaler 2  . Cetirizine HCl (ZYRTEC ALLERGY PO) Take by mouth.    . Enoxaparin Sodium (LOVENOX ) Inject 8 mg into the skin.     . Famotidine (PEPCID IV) Inject into the vein.    Marland Kitchen Omeprazole (PRILOSEC PO) Take by mouth.    . Ondansetron HCl (ZOFRAN IV) Inject into the vein.    . Prenatal Vit-Fe Fumarate-FA (PRENATAL PO) Take 2 tablets by mouth every evening. Prenatal gummy vitamin.    Marland Kitchen sertraline (ZOLOFT) 50 MG tablet TAKE 1 TABLET BY MOUTH DAILY 30 tablet 2  . zolpidem (AMBIEN) 10 MG tablet TAKE ONE TABLET BY MOUTH EVERY NIGHT AT BEDTIME 30 tablet 0  . lactobacillus acidophilus (BACID) TABS tablet Take 2 tablets by mouth 3 (three) times daily.     No current facility-administered medications for this visit.     Past Medical History  Diagnosis Date  . Crohn disease 2000  . Asthma   . Endometriosis   . Dysautonomia     recurrent syncope s/p ILR by Dr Doreatha Lew  . Palpitations   . Anxiety   . GERD (gastroesophageal reflux disease)   . Vertigo   .  Anginal pain only on 09/22/2014  . H/O hiatal hernia   . Chest pain     a. s/p intermediate risk nuclear stress test on 09/23/14 and normal LHC on 09/24/14   . Chronic nausea   . Migraine   . Pulmonary embolism affecting pregnancy     ROS:   All systems reviewed and negative except as noted in the HPI.   Past Surgical History  Procedure Laterality Date  . Ileocecetomy  2002 X 2  . Loop recorder implant  11/2009    by DR Doreatha Lew  . Knee arthroscopy Bilateral 1988-1990  . Cesarean section  2011    emergency  CS due to  placental abruption  . Partial colectomy  2002    partial removed due to crohns  . Appendectomy  ~ 1992  . Tonsillectomy  ~ 1996  . US echocardiography  11/11/2009    EF 55-60%  . Laparoscopy for ectopic pregnancy  11/2012  . Nasal sinus surgery  ~ 2007  . Endoscopic release transverse carpal ligament of hand Right 12/2010  . Colon surgery    . Cystoscopy w/ stone manipulation  X 2  . Cesarean section  2014    "preeclampsia"  . Left heart catheterization with coronary angiogram N/A 09/24/2014    Procedure: LEFT HEART CATHETERIZATION WITH CORONARY ANGIOGRAM;  Surgeon: Leonie Man, MD;  Location: [facility] CATH LAB;  Service: Cardiovascular;  Laterality: N/A;     Family History  Problem Relation Age of Onset  . Hypertension Mother   . Hyperlipidemia Mother   . Arthritis Mother   . Diabetes Father   . Coronary artery disease Father   . Heart disease Father 67    MI age 58  . Hyperlipidemia Brother   . Hypertension Brother      History   Social History  . Marital Status: Married    Spouse Name: [name]  . Number of Children: 2  . Years of Education: college   Occupational History  .  Other    Airline pilot  .  [place] History Main Topics  . Smoking status: Never Smoker   . Smokeless tobacco: Never Used  . Alcohol Use: Yes     Comment: 09/22/2014 "glass of wine maybe once/month"  . Drug Use: No  . Sexual Activity: Yes   Other Topics Concern  . Not on file   Social History Narrative   Patient lives at home with her husband [name]). Patient works full time for [employer].    Education [employer].   Right handed.   Caffeine one cup of coffee daily.     BP 100/68 mmHg  Pulse 100  Ht 5' 4"  (1.626 m)  Wt 155 lb (70.308 kg)  BMI 26.59 kg/m2  LMP 09/16/2014  Physical Exam:  Anxious but not ill appearing 39 year old woman, NAD HEENT: Unremarkable Neck:  67cm JVD, no thyromegally Back:  No CVA tenderness Lungs:  Clear with  no wheezes, rales, or rhonchi. Indwelling PICC line in left arm. HEART:  Regular rate rhythm, no murmurs, no rubs, no clicks Abd:  soft, positive bowel sounds, no organomegally, no rebound, no guarding Ext:  2 plus pulses, no edema, no cyanosis, no clubbing Skin:  No rashes no nodules Neuro:  CN II through XII intact, motor grossly intact   Assess/Plan:

## 2015-05-16 ENCOUNTER — Other Ambulatory Visit (HOSPITAL_COMMUNITY): Payer: Self-pay | Admitting: Obstetrics and Gynecology

## 2015-05-16 ENCOUNTER — Inpatient Hospital Stay (HOSPITAL_COMMUNITY)
Admission: AD | Admit: 2015-05-16 | Discharge: 2015-05-16 | Disposition: A | Discharge status: Home / Self Care | Payer: BC Managed Care – PPO | Source: Ambulatory Visit | Attending: Obstetrics & Gynecology | Admitting: Obstetrics & Gynecology

## 2015-05-16 ENCOUNTER — Ambulatory Visit (HOSPITAL_COMMUNITY)
Admission: RE | Admit: 2015-05-16 | Discharge: 2015-05-16 | Disposition: A | Discharge status: Home / Self Care | Payer: BC Managed Care – PPO | Source: Ambulatory Visit | Attending: Obstetrics and Gynecology | Admitting: Obstetrics and Gynecology

## 2015-05-16 ENCOUNTER — Encounter (HOSPITAL_COMMUNITY): Payer: Self-pay

## 2015-05-16 ENCOUNTER — Other Ambulatory Visit (HOSPITAL_COMMUNITY): Payer: Self-pay | Admitting: Maternal and Fetal Medicine

## 2015-05-16 DIAGNOSIS — N898 Other specified noninflammatory disorders of vagina: Secondary | ICD-10-CM | POA: Diagnosis present

## 2015-05-16 DIAGNOSIS — O09213 Supervision of pregnancy with history of pre-term labor, third trimester: Secondary | ICD-10-CM | POA: Insufficient documentation

## 2015-05-16 DIAGNOSIS — O283 Abnormal ultrasonic finding on antenatal screening of mother: Secondary | ICD-10-CM | POA: Insufficient documentation

## 2015-05-16 DIAGNOSIS — Z86711 Personal history of pulmonary embolism: Secondary | ICD-10-CM | POA: Diagnosis not present

## 2015-05-16 DIAGNOSIS — O3421 Maternal care for scar from previous cesarean delivery: Secondary | ICD-10-CM

## 2015-05-16 DIAGNOSIS — Z885 Allergy status to narcotic agent status: Secondary | ICD-10-CM | POA: Diagnosis not present

## 2015-05-16 DIAGNOSIS — Z3A34 34 weeks gestation of pregnancy: Secondary | ICD-10-CM | POA: Insufficient documentation

## 2015-05-16 DIAGNOSIS — O288 Other abnormal findings on antenatal screening of mother: Secondary | ICD-10-CM

## 2015-05-16 DIAGNOSIS — O09523 Supervision of elderly multigravida, third trimester: Secondary | ICD-10-CM

## 2015-05-16 DIAGNOSIS — O9989 Other specified diseases and conditions complicating pregnancy, childbirth and the puerperium: Secondary | ICD-10-CM | POA: Insufficient documentation

## 2015-05-16 LAB — AMNISURE RUPTURE OF MEMBRANE (ROM) NOT AT ARMC: AMNISURE: NEGATIVE

## 2015-05-16 NOTE — ED Notes (Signed)
Report called to [facility], In MAU.  Pt to MAU for evaluation.

## 2015-05-16 NOTE — MAU Provider Note (Signed)
History    CSN: [id number]  Arrival date and time: 05/16/15 1148  Chief Complaint  Patient presents with  . Vaginal Discharge   HPI  Patient is 38 y.o. [id number] 41w2dhere with complaints of vaginal discharge concerning for ROM. States that she had an appointment with MFM today for BPP/non-stress test that she has biweekly. She endorsed some leaking and irritation so they sent her to MAU for further evaluation. During BPP patient scored 8/8 but had non-reactive stress test (no accels).   Patient's PMH is significant for 2 PEs to which patient is on Lovenox. She recently had a port-a-cath infection so had to get a PICC place. She received constant zofran. She also has LR. She had hyperemesis gravida in all her pregnancies.   [facility]   +FM, +contractions but irregular, +fluid leakage, denies VB and vaginal discharge.  Past Medical History  Diagnosis Date  . Crohn disease 2000  . Asthma   . Endometriosis   . Dysautonomia     recurrent syncope s/p ILR by Dr TDoreatha Lew . Palpitations   . Anxiety   . GERD (gastroesophageal reflux disease)   . Vertigo   . Anginal pain only on 09/22/2014  . H/O hiatal hernia   . Chest pain     a. s/p intermediate risk nuclear stress test on 09/23/14 and normal LHC on 09/24/14   . Chronic nausea   . Migraine   . Pulmonary embolism affecting pregnancy     Past Surgical History  Procedure Laterality Date  . Ileocecetomy  2002 X 2  . Loop recorder implant  11/2009    by DR TDoreatha Lew . Knee arthroscopy Bilateral 1988-1990  . Cesarean section  2011    emergency  CS due to placental abruption  . Partial colectomy  2002    partial removed due to crohns  . Appendectomy  ~ 1992  . Tonsillectomy  ~ 1996  . UKoreaechocardiography  11/11/2009    EF 55-60%  . Laparoscopy for ectopic pregnancy  11/2012  . Nasal sinus surgery  ~ 2007  . Endoscopic release transverse  carpal ligament of hand Right 12/2010  . Colon surgery    . Cystoscopy w/ stone manipulation  X 2  . Cesarean section  2014    "preeclampsia"  . Left heart catheterization with coronary angiogram N/A 09/24/2014    Procedure: LEFT HEART CATHETERIZATION WITH CORONARY ANGIOGRAM;  Surgeon: DLeonie Man MD;  [facility];  Service: Cardiovascular;  Laterality: N/A;    Family History  Problem Relation Age of Onset  . Hypertension Mother   . Hyperlipidemia Mother   . Arthritis Mother   . Diabetes Father   . Coronary artery disease Father   . Heart disease Father 474   MI age 39 . Hyperlipidemia Brother   . Hypertension Brother     History  Substance Use Topics  . Smoking status: Never Smoker   . Smokeless tobacco: Never Used  . Alcohol Use: Yes     Comment: 09/22/2014 "glass of wine maybe once/month"    Allergies:  Allergies  Allergen Reactions  . Compazine [Prochlorperazine Edisylate] Anaphylaxis  . Reglan [Metoclopramide] Anaphylaxis and Other (See Comments)    Other reaction(s): GI Upset (intolerance) Intensifies her Chron's  . Sulfonamide Derivatives Other (See Comments)    Hematemesis; documented while a young child  . Clarithromycin Other (See Comments)  Reacts with chron's disease  . Sulfamethoxazole Rash and Other (See Comments)    Internal bleeding and kidney infection  . Biaxin [Clarithromycin] Other (See Comments)    Cant take due to Chrons disease  . Codeine Nausea And Vomiting and Other (See Comments)    Dilaudid and demerol OK  . Hydrocodone Nausea And Vomiting  . Macrobid [Nitrofurantoin] Nausea And Vomiting    Other reaction(s): GI Upset (intolerance)  . Phenergan [Promethazine Hcl] Other (See Comments)    "muscle twitches"  . Promethazine Rash and Other (See Comments)    Muscular seizures  . Promethazine Hcl Other (See Comments)    Muscular seizures  . Sulfa Antibiotics Hives and Other (See Comments)    Hematemesis; documented while a  young child    Prescriptions prior to admission  Medication Sig Dispense Refill Last Dose  . acetaminophen (TYLENOL) 500 MG tablet Take 1,000 mg by mouth every 6 (six) hours as needed for headache.   Taking  . albuterol (PROVENTIL HFA;VENTOLIN HFA) 108 (90 BASE) MCG/ACT inhaler Inhale 2 puffs into the lungs every 6 (six) hours as needed for wheezing or shortness of breath. 1 Inhaler 2 Taking  . Cetirizine HCl (ZYRTEC ALLERGY PO) Take by mouth.   Taking  . Enoxaparin Sodium (LOVENOX [place]) Inject 8 mg into the skin.    Taking  . Famotidine (PEPCID IV) Inject into the vein.   Taking  . lactobacillus acidophilus (BACID) TABS tablet Take 2 tablets by mouth 3 (three) times daily.   Taking  . Omeprazole (PRILOSEC PO) Take by mouth.   Taking  . Ondansetron HCl (ZOFRAN IV) Inject into the vein.   Taking  . Prenatal Vit-Fe Fumarate-FA (PRENATAL PO) Take 2 tablets by mouth every evening. Prenatal gummy vitamin.   Taking  . sertraline (ZOLOFT) 50 MG tablet TAKE 1 TABLET BY MOUTH DAILY 30 tablet 2 Taking  . zolpidem (AMBIEN) 10 MG tablet TAKE ONE TABLET BY MOUTH EVERY NIGHT AT BEDTIME 30 tablet 0 Taking    Review of Systems  Constitutional: Negative for fever and chills.  Eyes: Negative for blurred vision and double vision.  Respiratory: Negative for shortness of breath.   Cardiovascular: Negative for chest pain and leg swelling.  Gastrointestinal: Positive for nausea and vomiting.  Genitourinary: Negative for dysuria.  Neurological: Negative for headaches.   All other ROS per HPI.   Physical Exam   Blood pressure 117/84, pulse 99, temperature 98.2 F (36.8 C), temperature source Oral, resp. rate 16, last menstrual period 09/16/2014.  Physical Exam  Constitutional: She is oriented to person, place, and time. She appears well-developed and well-nourished. No distress.  HENT:  Head: Normocephalic and atraumatic.  Cardiovascular: Normal rate, regular rhythm, normal heart sounds and intact distal  pulses.   Respiratory: Effort normal and breath sounds normal.  GI:  Gravid, non-tender  Genitourinary: Vagina normal.  SVE: fingertip/thick/high  Musculoskeletal: Normal range of motion.  Neurological: She is alert and oriented to person, place, and time.  Skin: Skin is warm and dry.  Psychiatric: She has a normal mood and affect.   MAU Course  Procedures  MDM - reviewed FHT: not reactive but reassuring with good variability. No accels or decels.  - amnisure negative - BPP 8/10  Assessment and Plan  Patient is 39 y.o. [id number] 31w2dreporting leakage of fluid likely secondary to increased vaginal discharge vs incontinence. - No concerns or red flags on exam. Reassuring for normal vaginal discharge. - fetal kick counts reinforced - preterm labor  precautions  Luiz Blare, DO 05/16/2015, 3:07 PM PGY-1, [place]   OB fellow attestation:  I have seen and examined this patient; I agree with above documentation in the resident's note.   LYNSEY ANGE is a 39 y.o. [phone] sent from the ultrasound unit for evaluation of vaginal discharge vs leakage of fluid.  +FM, denies LOF, VB, contractions.   PE: BP 117/84 mmHg  Pulse 99  Temp(Src) 98.2 F (36.8 C) (Oral)  Resp 16  LMP 09/16/2014 Gen: calm comfortable, NAD Resp: normal effort, no distress Abd: gravid  ROS, labs, PMH reviewed NST reassuring BPP 8/8  Amnisure neg SVE: ft/th/hi  Plan: Not in labor. Membranes intact.  - fetal kick counts reinforced, preterm labor precautions - continue routine follow up in OB clinic, next appointment scheduled for Thursday  Shelbie Hutching, MD 5:19 PM

## 2015-05-16 NOTE — Discharge Instructions (Signed)
Preterm Labor Information Preterm labor is when labor starts at less than 37 weeks of pregnancy. The normal length of a pregnancy is 39 to 41 weeks. CAUSES Often, there is no identifiable underlying cause as to why a woman goes into preterm labor. One of the most common known causes of preterm labor is infection. Infections of the uterus, cervix, vagina, amniotic sac, bladder, kidney, or even the lungs (pneumonia) can cause labor to start. Other suspected causes of preterm labor include:   Urogenital infections, such as yeast infections and bacterial vaginosis.   Uterine abnormalities (uterine shape, uterine septum, fibroids, or bleeding from the placenta).   A cervix that has been operated on (it may fail to stay closed).   Malformations in the fetus.   Multiple gestations (twins, triplets, and so on).   Breakage of the amniotic sac.  RISK FACTORS  Having a previous history of preterm labor.   Having premature rupture of membranes (PROM).   Having a placenta that covers the opening of the cervix (placenta previa).   Having a placenta that separates from the uterus (placental abruption).   Having a cervix that is too weak to hold the fetus in the uterus (incompetent cervix).   Having too much fluid in the amniotic sac (polyhydramnios).   Taking illegal drugs or smoking while pregnant.   Not gaining enough weight while pregnant.   Being younger than 56 and older than 39 years old.   Having a low socioeconomic status.   Being African American. SYMPTOMS Signs and symptoms of preterm labor include:   Menstrual-like cramps, abdominal pain, or back pain.  Uterine contractions that are regular, as frequent as six in an hour, regardless of their intensity (may be mild or painful).  Contractions that start on the top of the uterus and spread down to the lower abdomen and back.   A sense of increased pelvic pressure.   A watery or bloody mucus discharge that  comes from the vagina.  TREATMENT Depending on the length of the pregnancy and other circumstances, your health care provider may suggest bed rest. If necessary, there are medicines that can be given to stop contractions and to mature the fetal lungs. If labor happens before 34 weeks of pregnancy, a prolonged hospital stay may be recommended. Treatment depends on the condition of both you and the fetus.  WHAT SHOULD YOU DO IF YOU THINK YOU ARE IN PRETERM LABOR? Call your health care provider right away. You will need to go to the hospital to get checked immediately. HOW CAN YOU PREVENT PRETERM LABOR IN FUTURE PREGNANCIES? You should:   Stop smoking if you smoke.  Maintain healthy weight gain and avoid chemicals and drugs that are not necessary.  Be watchful for any type of infection.  Inform your health care provider if you have a known history of preterm labor. Document Released: 03/01/2004 Document Revised: 08/12/2013 Document Reviewed: 01/12/2013 [facility] Patient Information 2015 [place], [place]. This information is not intended to replace advice given to you by your health care provider. Make sure you discuss any questions you have with your health care provider.

## 2015-05-16 NOTE — MAU Note (Signed)
Started with ? Leaking fluid since Saturday, was seen at MFM today had BPP 8/8, changing underwear frequently,  Has had irregular contractions.

## 2015-05-24 ENCOUNTER — Ambulatory Visit (HOSPITAL_COMMUNITY)
Admission: RE | Admit: 2015-05-24 | Discharge: 2015-05-24 | Disposition: A | Discharge status: Home / Self Care | Payer: BC Managed Care – PPO | Source: Ambulatory Visit | Attending: Maternal and Fetal Medicine | Admitting: Maternal and Fetal Medicine

## 2015-05-24 DIAGNOSIS — O09213 Supervision of pregnancy with history of pre-term labor, third trimester: Secondary | ICD-10-CM | POA: Diagnosis not present

## 2015-05-24 DIAGNOSIS — Z3A35 35 weeks gestation of pregnancy: Secondary | ICD-10-CM | POA: Insufficient documentation

## 2015-05-24 DIAGNOSIS — O211 Hyperemesis gravidarum with metabolic disturbance: Secondary | ICD-10-CM | POA: Insufficient documentation

## 2015-05-24 DIAGNOSIS — Z3A34 34 weeks gestation of pregnancy: Secondary | ICD-10-CM | POA: Insufficient documentation

## 2015-05-24 DIAGNOSIS — K5090 Crohn's disease, unspecified, without complications: Secondary | ICD-10-CM | POA: Insufficient documentation

## 2015-05-24 DIAGNOSIS — O3421 Maternal care for scar from previous cesarean delivery: Secondary | ICD-10-CM | POA: Diagnosis not present

## 2015-05-24 DIAGNOSIS — Z36 Encounter for antenatal screening of mother: Secondary | ICD-10-CM | POA: Diagnosis present

## 2015-05-24 DIAGNOSIS — O09523 Supervision of elderly multigravida, third trimester: Secondary | ICD-10-CM | POA: Diagnosis not present

## 2015-05-24 DIAGNOSIS — O99619 Diseases of the digestive system complicating pregnancy, unspecified trimester: Secondary | ICD-10-CM

## 2015-05-26 ENCOUNTER — Other Ambulatory Visit (HOSPITAL_COMMUNITY): Payer: BC Managed Care – PPO

## 2015-05-26 ENCOUNTER — Ambulatory Visit (HOSPITAL_COMMUNITY): Payer: BC Managed Care – PPO

## 2015-05-27 ENCOUNTER — Ambulatory Visit (HOSPITAL_COMMUNITY): Payer: BC Managed Care – PPO

## 2015-05-27 ENCOUNTER — Other Ambulatory Visit (HOSPITAL_COMMUNITY): Payer: BC Managed Care – PPO

## 2015-05-30 ENCOUNTER — Ambulatory Visit (HOSPITAL_COMMUNITY)
Admission: RE | Admit: 2015-05-30 | Discharge: 2015-05-30 | Disposition: A | Discharge status: Home / Self Care | Payer: BC Managed Care – PPO | Source: Ambulatory Visit | Attending: Maternal and Fetal Medicine | Admitting: Maternal and Fetal Medicine

## 2015-05-30 ENCOUNTER — Other Ambulatory Visit (HOSPITAL_COMMUNITY): Payer: BC Managed Care – PPO

## 2015-05-30 ENCOUNTER — Encounter (HOSPITAL_COMMUNITY): Payer: Self-pay

## 2015-05-30 DIAGNOSIS — O283 Abnormal ultrasonic finding on antenatal screening of mother: Secondary | ICD-10-CM | POA: Insufficient documentation

## 2015-05-30 DIAGNOSIS — O09529 Supervision of elderly multigravida, unspecified trimester: Secondary | ICD-10-CM | POA: Insufficient documentation

## 2015-05-30 DIAGNOSIS — Z3A00 Weeks of gestation of pregnancy not specified: Secondary | ICD-10-CM | POA: Diagnosis not present

## 2015-06-02 DIAGNOSIS — Z3A36 36 weeks gestation of pregnancy: Secondary | ICD-10-CM | POA: Insufficient documentation

## 2015-06-02 DIAGNOSIS — O09529 Supervision of elderly multigravida, unspecified trimester: Secondary | ICD-10-CM | POA: Insufficient documentation

## 2015-06-03 ENCOUNTER — Encounter: Payer: BC Managed Care – PPO | Admitting: Obstetrics & Gynecology

## 2015-06-03 ENCOUNTER — Other Ambulatory Visit: Payer: BC Managed Care – PPO

## 2015-06-03 HISTORY — PX: OTHER SURGICAL HISTORY: SHX169

## 2015-06-06 ENCOUNTER — Other Ambulatory Visit (HOSPITAL_COMMUNITY): Payer: BC Managed Care – PPO

## 2015-06-09 ENCOUNTER — Other Ambulatory Visit (HOSPITAL_COMMUNITY): Payer: BC Managed Care – PPO

## 2015-06-09 ENCOUNTER — Ambulatory Visit (HOSPITAL_COMMUNITY): Payer: BC Managed Care – PPO

## 2015-06-10 ENCOUNTER — Encounter: Payer: Self-pay | Admitting: Family Medicine

## 2015-06-10 ENCOUNTER — Ambulatory Visit (INDEPENDENT_AMBULATORY_CARE_PROVIDER_SITE_OTHER): Payer: BC Managed Care – PPO | Admitting: Family Medicine

## 2015-06-10 VITALS — BP 118/66 | HR 113 | Temp 98.4°F | Wt 141.2 lb

## 2015-06-10 DIAGNOSIS — I2699 Other pulmonary embolism without acute cor pulmonale: Secondary | ICD-10-CM | POA: Diagnosis not present

## 2015-06-10 DIAGNOSIS — F331 Major depressive disorder, recurrent, moderate: Secondary | ICD-10-CM | POA: Diagnosis not present

## 2015-06-10 DIAGNOSIS — K5090 Crohn's disease, unspecified, without complications: Secondary | ICD-10-CM | POA: Diagnosis not present

## 2015-06-10 DIAGNOSIS — F325 Major depressive disorder, single episode, in full remission: Secondary | ICD-10-CM | POA: Insufficient documentation

## 2015-06-10 NOTE — Assessment & Plan Note (Signed)
Has follow up with heme soon to determine length of need of lovenox.

## 2015-06-10 NOTE — Patient Instructions (Signed)
Stop at front desk for referral to GI.  Continue prednisone 20 mg for now.  Call if fever or abdominal pain worsening

## 2015-06-10 NOTE — Progress Notes (Signed)
Pre visit review using our clinic review tool, if applicable. No additional management support is needed unless otherwise documented below in the visit note. 

## 2015-06-10 NOTE — Progress Notes (Signed)
Subjective:    Patient ID: [name], female    DOB: 20-Jun-1976, 39 y.o.   MRN: [id number]  HPI 39 year old female with history of  Crohn's disease, moderate persistent asthma autonomic dysfunction, recently S/P delivery of her third child on 06/03/2015 via repeat C section with salpingectomy. Pregnancy complicated by severe hyperemesis gravidum treated with  intravenous therapy  by way of her PICC line, She was also developed bilateral pulmonary emboli on bed rest in 03/2015 treated with lovenox 70 mg BID and a staph infection for which she received IV Vancomycin.   Her cardiologist is Dr. Lovena Le. [facility] OV 05/11/2015 S/p outpatient stress testing and catheterization in Oct 2015, both studies wnl   Has appt 07/06/15 with heme at [facility] to determine how long she needs to lovenox for.    Depression, stable and well contorlled on sertraline.  Typically after the last pregnancies she has  flare of crohn's with pregnancy.  In past started 20 mg prednisone  She was discharge  from [facility] she was started on 15 mg daily, tried 2 days of 10 mg, today she went to 20 mg. 10 She is not doing well on this.. Began with severe abdominal pain, having  10 BMs a day, loose, no blood.  No fever.  She is very over tired. She is eating and drinking well and no longer has PICC line in place.   She wants to breast feed. Her previous GI MD: Dr Oletta Lamas in past recommended 6 MP.  She would not be able to do  Breast feeding with this. She feels very strongly about breast feeding and wants to set up with a new GI specialist.   Review of Systems  Constitutional: Negative for fatigue.  HENT: Negative for ear pain.   Eyes: Negative for pain.  Respiratory: Negative for cough and shortness of breath.   Cardiovascular: Negative for chest pain.        Objective:   Physical Exam  Constitutional: Vital signs are normal. She appears well-developed and well-nourished. She is cooperative.  Non-toxic appearance. She does  not appear ill. No distress.  HENT:  Head: Normocephalic.  Right Ear: Hearing, tympanic membrane, external ear and ear canal normal. Tympanic membrane is not erythematous, not retracted and not bulging.  Left Ear: Hearing, tympanic membrane, external ear and ear canal normal. Tympanic membrane is not erythematous, not retracted and not bulging.  Nose: No mucosal edema or rhinorrhea. Right sinus exhibits no maxillary sinus tenderness and no frontal sinus tenderness. Left sinus exhibits no maxillary sinus tenderness and no frontal sinus tenderness.  Mouth/Throat: Uvula is midline, oropharynx is clear and moist and mucous membranes are normal.  Eyes: Conjunctivae, EOM and lids are normal. Pupils are equal, round, and reactive to light. Lids are everted and swept, no foreign bodies found.  Neck: Trachea normal and normal range of motion. Neck supple. Carotid bruit is not present. No thyroid mass and no thyromegaly present.  Cardiovascular: Normal rate, regular rhythm, S1 normal, S2 normal, normal heart sounds, intact distal pulses and normal pulses.  Exam reveals no gallop and no friction rub.   No murmur heard. Pulmonary/Chest: Effort normal and breath sounds normal. No tachypnea. No respiratory distress. She has no decreased breath sounds. She has no wheezes. She has no rhonchi. She has no rales.  Abdominal: Soft. Normal appearance and bowel sounds are normal. There is tenderness in the right lower quadrant and left lower quadrant.  Well healing low transverse C section  scar  Neurological: She is alert.  Skin: Skin is warm, dry and intact. No rash noted.  Psychiatric: Her speech is normal and behavior is normal. Judgment and thought content normal. Her mood appears not anxious. Cognition and memory are normal. She does not exhibit a depressed mood.          Assessment & Plan:

## 2015-06-10 NOTE — Assessment & Plan Note (Signed)
Well controlled on sertraline at this time. She has a very supportive husband.

## 2015-06-10 NOTE — Assessment & Plan Note (Signed)
Currently with acute increase in diarrhea and low abdominal pain typical of past crohn's flares following her pregnancies.  She was displeased with GI  Dr. Loney Loh care with past postpartum flares as he recommended 6MP which would be contraindicated in breast feeding. She feels strongly about breast feeding and  would like a referral to another GI MD. She has had some improvement with returning to 20 mg dose of prednisone. We will continue this dose for now.

## 2015-06-13 ENCOUNTER — Ambulatory Visit (HOSPITAL_COMMUNITY): Payer: BC Managed Care – PPO

## 2015-06-20 ENCOUNTER — Other Ambulatory Visit: Payer: Self-pay | Admitting: Family Medicine

## 2015-06-20 NOTE — Telephone Encounter (Signed)
Last office visit 06/10/2015.  Not on current medication list.  Ok to refill?

## 2015-06-21 NOTE — Telephone Encounter (Signed)
Called in to [facility].

## 2015-07-06 ENCOUNTER — Other Ambulatory Visit: Payer: Self-pay | Admitting: Family Medicine

## 2015-07-06 ENCOUNTER — Telehealth: Payer: Self-pay | Admitting: Family Medicine

## 2015-07-06 NOTE — Telephone Encounter (Signed)
Last office visit 06/10/2015.  Last refilled 04/28/2015 for #30 with no refills.  Ok to refill?

## 2015-07-06 NOTE — Telephone Encounter (Signed)
Pt left v/m; Dr Diona Browner is going to start to take over refill of prednisone 20 mg taking one tab daily from [facility] and pt request updated rx sent to [facility] for prednisone. Please advise. Prednisone not on current med list and pt was seen 06/10/15.

## 2015-07-07 MED ORDER — PREDNISONE 20 MG PO TABS: 20.0000 mg | ORAL_TABLET | Freq: Every day | ORAL | Status: DC

## 2015-07-07 NOTE — Telephone Encounter (Signed)
Left message for [name] that prescription for prednisone has been sent in to [facility] as requested.

## 2015-07-07 NOTE — Telephone Encounter (Signed)
Called in to [facility].

## 2015-07-19 ENCOUNTER — Encounter: Payer: Self-pay | Admitting: Family Medicine

## 2015-07-19 ENCOUNTER — Ambulatory Visit (INDEPENDENT_AMBULATORY_CARE_PROVIDER_SITE_OTHER): Payer: BC Managed Care – PPO | Admitting: Family Medicine

## 2015-07-19 VITALS — BP 120/80 | HR 123 | Temp 98.1°F | Ht 64.0 in | Wt 146.8 lb

## 2015-07-19 DIAGNOSIS — Z7901 Long term (current) use of anticoagulants: Secondary | ICD-10-CM | POA: Insufficient documentation

## 2015-07-19 DIAGNOSIS — I2699 Other pulmonary embolism without acute cor pulmonale: Secondary | ICD-10-CM | POA: Diagnosis not present

## 2015-07-19 DIAGNOSIS — Z79899 Other long term (current) drug therapy: Secondary | ICD-10-CM | POA: Insufficient documentation

## 2015-07-19 MED ORDER — GUAIFENESIN ER 600 MG PO TB12
1200.0000 mg | ORAL_TABLET | Freq: Two times a day (BID) | ORAL | Status: DC
Start: 2015-07-19 — End: 2015-08-09

## 2015-07-19 MED ORDER — WARFARIN SODIUM 2.5 MG PO TABS: 2.5000 mg | ORAL_TABLET | Freq: Every day | ORAL | Status: DC

## 2015-07-19 NOTE — Progress Notes (Signed)
   Subjective:    Patient ID: [name], female    DOB: Feb 04, 1976, 40 y.o.   MRN: [id number]  HPI   39 year old female with recent bilateral PE in 03/2015 during her pregnancy.  She has been treated with lovenox 70 mg BID since over the last 3 months.    She has recently seen hematology Dr. Garrison Columbus on 07/06/2015 for evaluation of PE and determination of length of anticoagulation needed.  I spoke with Dr. Marcheta Grammes and reviewed her note in detail.  She is interested in changing to coumadin from lovenox as she wishes to breast feed and other anticoagulants are not safe with breast feeding.  At last OV she started work up for clotting disorders. Plan anticoagulation for another 3-6 months. She has follow up planned with heme in 5-6 weeks.       Review of Systems  Constitutional: Negative for fever and fatigue.  HENT: Positive for congestion, rhinorrhea, sinus pressure and sore throat. Negative for ear pain.         3 days  Eyes: Negative for pain.  Respiratory: Negative for chest tightness and shortness of breath.   Cardiovascular: Negative for chest pain, palpitations and leg swelling.  Gastrointestinal: Negative for abdominal pain.  Genitourinary: Negative for dysuria.       Objective:   Physical Exam  Constitutional: Vital signs are normal. She appears well-developed and well-nourished. She is cooperative.  Non-toxic appearance. She does not appear ill. No distress.  HENT:  Head: Normocephalic.  Right Ear: Hearing, tympanic membrane, external ear and ear canal normal. Tympanic membrane is not erythematous, not retracted and not bulging.  Left Ear: Hearing, tympanic membrane, external ear and ear canal normal. Tympanic membrane is not erythematous, not retracted and not bulging.  Nose: No mucosal edema or rhinorrhea. Right sinus exhibits no maxillary sinus tenderness and no frontal sinus tenderness. Left sinus exhibits no maxillary sinus tenderness and no frontal sinus tenderness.    Mouth/Throat: Uvula is midline, oropharynx is clear and moist and mucous membranes are normal.  Eyes: Conjunctivae, EOM and lids are normal. Pupils are equal, round, and reactive to light. Lids are everted and swept, no foreign bodies found.  Neck: Trachea normal and normal range of motion. Neck supple. Carotid bruit is not present. No thyroid mass and no thyromegaly present.  Cardiovascular: Normal rate, regular rhythm, S1 normal, S2 normal, normal heart sounds, intact distal pulses and normal pulses.  Exam reveals no gallop and no friction rub.   No murmur heard. Pulmonary/Chest: Effort normal and breath sounds normal. No tachypnea. No respiratory distress. She has no decreased breath sounds. She has no wheezes. She has no rhonchi. She has no rales.  Abdominal: Soft. Normal appearance and bowel sounds are normal. There is no tenderness.  Neurological: She is alert.  Skin: Skin is warm, dry and intact. No rash noted.  Psychiatric: Her speech is normal and behavior is normal. Judgment and thought content normal. Her mood appears not anxious. Cognition and memory are normal. She does not exhibit a depressed mood.          Assessment & Plan:

## 2015-07-19 NOTE — Assessment & Plan Note (Signed)
Reviewed risks of medication such as coumadin ie bleeding. Given vit K info on foods to keep steady and not have fluctuate. Reviewed teratogenic effect.. Pt has had BTL.

## 2015-07-19 NOTE — Progress Notes (Signed)
Pre visit review using our clinic review tool, if applicable. No additional management support is needed unless otherwise documented below in the visit note. 

## 2015-07-19 NOTE — Patient Instructions (Addendum)
Start coumadin 5 mg daily.  We will call you for appt on Friday in coumadin clinic with [facility].  Continue lovenox until INR 2-3  Can use mucinex for  nasal congestion.  Vitamin K Foods and Warfarin Warfarin is a medicine that helps prevent harmful blood clots by causing blood to clot more slowly. It does this by decreasing the activity of vitamin K, which promotes normal blood clotting. For the dose of warfarin you have been prescribed to work well, you need to get about the same amount of vitamin K from your food from day to day. Suddenly getting a lot more vitamin K could cause your blood to clot too quickly. A sudden decrease in vitamin K intake could cause your blood to clot too slowly. These changes in vitamin K intake could lead to dangerous blood clotsor to bleeding. WHAT GENERAL GUIDELINES DO I NEED TO FOLLOW?  Keep your intake of vitamin K consistent from day to day. To do this, you must be aware of which foods contain moderate or high amounts of vitamin K. Listed below are some foods that are very high, high, or moderately high in vitamin K. If you eat these foods, make sure you eat a consistent amount of them from day to day.  Avoid major changes in your diet, or tell your health care provider before changing your diet.  If you take a multivitamin that contains vitamin K, be sure to take it every day.  If you drink green tea, drink the same amount each day. WHAT FOODS ARE VERY HIGH IN VITAMIN K?   Greens, such as Swiss chard and beet, collard, mustard, or turnip greens (fresh or frozen, cooked).  Kale (fresh or frozen, cooked).   Parsley (raw).  Spinach (cooked).  WHAT FOODS ARE HIGH IN VITAMIN K?  Asparagus (frozen, cooked).  Beans, green (frozen, cooked).  Broccoli.   Bok choy (cooked).   Brussels sprouts (fresh or frozen, cooked).  Cabbage (cooked).  Coleslaw. WHAT FOODS ARE MODERATELY HIGH IN VITAMIN K?  Blueberries.  Black-eyed peas.  Endive (raw).    Green leaf lettuce (raw).   Green scallions (raw).  Kale (raw).  Okra (frozen, cooked).  Plantains (fried).  Romaine lettuce (raw).   Sauerkraut (canned).   Spinach (raw). Document Released: 10/07/2009 Document Revised: 12/15/2013 Document Reviewed: 10/14/2013 [facility] Patient Information 2015 [place], [place]. This information is not intended to replace advice given to you by your health care provider. Make sure you discuss any questions you have with your health care provider.

## 2015-07-19 NOTE — Assessment & Plan Note (Signed)
Needs anticoagulation for 3-6 more months. Duration to be decided at upcoming OV in 1 month by hematology. Start coumadin 5 mg daily with lovenox bridge until INR 2-3.  Start tommorow AM, return for INR on day 3. Likely will need close follow up INRs in coumadin clinic.

## 2015-07-22 ENCOUNTER — Telehealth: Payer: Self-pay | Admitting: *Deleted

## 2015-07-22 ENCOUNTER — Ambulatory Visit (INDEPENDENT_AMBULATORY_CARE_PROVIDER_SITE_OTHER): Payer: BC Managed Care – PPO | Admitting: *Deleted

## 2015-07-22 ENCOUNTER — Telehealth: Payer: Self-pay | Admitting: Family Medicine

## 2015-07-22 DIAGNOSIS — Z5181 Encounter for therapeutic drug level monitoring: Secondary | ICD-10-CM | POA: Insufficient documentation

## 2015-07-22 DIAGNOSIS — I2699 Other pulmonary embolism without acute cor pulmonale: Secondary | ICD-10-CM

## 2015-07-22 LAB — POCT INR: INR: 1.1

## 2015-07-22 MED ORDER — ENOXAPARIN SODIUM 80 MG/0.8ML ~~LOC~~ SOLN: 80.0000 mg | Freq: Two times a day (BID) | SUBCUTANEOUS | Status: DC

## 2015-07-22 NOTE — Telephone Encounter (Signed)
Received fax from [facility] requesting Prior Authorization for Dexilant 60 mg.  PA completed on CoverMyMeds.  PA approved thur 06/2016.  [place] notified.

## 2015-07-22 NOTE — Telephone Encounter (Signed)
Patient did not come in for their appointment today coumadin clinic.  Please let me know if patient needs to be contacted immediately for follow up or no follow up needed.

## 2015-07-22 NOTE — Telephone Encounter (Signed)
I contacted the patient and rescheduled her for this afternoon.  Thanks!

## 2015-07-22 NOTE — Progress Notes (Signed)
Pre visit review using our clinic review tool, if applicable. No additional management support is needed unless otherwise documented below in the visit note. 

## 2015-07-25 ENCOUNTER — Ambulatory Visit (INDEPENDENT_AMBULATORY_CARE_PROVIDER_SITE_OTHER): Payer: BC Managed Care – PPO | Admitting: Internal Medicine

## 2015-07-25 ENCOUNTER — Encounter: Payer: Self-pay | Admitting: Internal Medicine

## 2015-07-25 ENCOUNTER — Ambulatory Visit (INDEPENDENT_AMBULATORY_CARE_PROVIDER_SITE_OTHER)
Admission: RE | Admit: 2015-07-25 | Discharge: 2015-07-25 | Disposition: A | Discharge status: Home / Self Care | Payer: BC Managed Care – PPO | Source: Ambulatory Visit | Attending: Internal Medicine | Admitting: Internal Medicine

## 2015-07-25 ENCOUNTER — Other Ambulatory Visit: Payer: Self-pay | Admitting: Internal Medicine

## 2015-07-25 ENCOUNTER — Telehealth: Payer: Self-pay

## 2015-07-25 VITALS — BP 104/70 | HR 87 | Temp 98.0°F | Wt 149.0 lb

## 2015-07-25 DIAGNOSIS — R062 Wheezing: Secondary | ICD-10-CM

## 2015-07-25 DIAGNOSIS — R05 Cough: Secondary | ICD-10-CM

## 2015-07-25 DIAGNOSIS — R059 Cough, unspecified: Secondary | ICD-10-CM

## 2015-07-25 MED ORDER — LEVOFLOXACIN 500 MG PO TABS: 500.0000 mg | ORAL_TABLET | Freq: Every day | ORAL | Status: DC

## 2015-07-25 MED ORDER — HYDROCODONE-HOMATROPINE 5-1.5 MG/5ML PO SYRP: 5.0000 mL | ORAL_SOLUTION | Freq: Three times a day (TID) | ORAL | Status: DC | PRN

## 2015-07-25 MED ORDER — ALBUTEROL SULFATE HFA 108 (90 BASE) MCG/ACT IN AERS: 2.0000 | INHALATION_SPRAY | Freq: Four times a day (QID) | RESPIRATORY_TRACT | Status: DC | PRN

## 2015-07-25 NOTE — Patient Instructions (Signed)
Cough, Adult  A cough is a reflex that helps clear your throat and airways. It can help heal the body or may be a reaction to an irritated airway. A cough may only last 2 or 3 weeks (acute) or may last more than 8 weeks (chronic).  CAUSES Acute cough:  Viral or bacterial infections. Chronic cough:  Infections.  Allergies.  Asthma.  Post-nasal drip.  Smoking.  Heartburn or acid reflux.  Some medicines.  Chronic lung problems (COPD).  Cancer. SYMPTOMS   Cough.  Fever.  Chest pain.  Increased breathing rate.  High-pitched whistling sound when breathing (wheezing).  Colored mucus that you cough up (sputum). TREATMENT   A bacterial cough may be treated with antibiotic medicine.  A viral cough must run its course and will not respond to antibiotics.  Your caregiver may recommend other treatments if you have a chronic cough. HOME CARE INSTRUCTIONS   Only take over-the-counter or prescription medicines for pain, discomfort, or fever as directed by your caregiver. Use cough suppressants only as directed by your caregiver.  Use a cold steam vaporizer or humidifier in your bedroom or home to help loosen secretions.  Sleep in a semi-upright position if your cough is worse at night.  Rest as needed.  Stop smoking if you smoke. SEEK IMMEDIATE MEDICAL CARE IF:   You have pus in your sputum.  Your cough starts to worsen.  You cannot control your cough with suppressants and are losing sleep.  You begin coughing up blood.  You have difficulty breathing.  You develop pain which is getting worse or is uncontrolled with medicine.  You have a fever. MAKE SURE YOU:   Understand these instructions.  Will watch your condition.  Will get help right away if you are not doing well or get worse. Document Released: 06/08/2011 Document Revised: 03/03/2012 Document Reviewed: 06/08/2011 Exit[employer] Patient Information 2015 [employer]. This information is not intended  to replace advice given to you by your health care provider. Make sure you discuss any questions you have with your health care provider.  

## 2015-07-25 NOTE — Progress Notes (Signed)
HPI  Pt presents to the clinic today with c/o coughing and wheezing. This started 1 week ago.  The cough is productive of yellow mucous. She denies fever, chills or body aches. She denies shortness of breath. She has tried Delsym, Zyrtec and Mucinex without much relief. She was given a zpack but does not feel like it has helped at all. She does have a history of asthma. She also had a PE during her recent pregnancy. She is on Lovenox on a bridge to coumadin.  Review of Systems      Past Medical History  Diagnosis Date  . Crohn disease 2000  . Asthma   . Endometriosis   . Dysautonomia     recurrent syncope s/p ILR by Dr Doreatha Lew  . Palpitations   . Anxiety   . GERD (gastroesophageal reflux disease)   . Vertigo   . Anginal pain only on 09/22/2014  . H/O hiatal hernia   . Chest pain     a. s/p intermediate risk nuclear stress test on 09/23/14 and normal LHC on 09/24/14   . Chronic nausea   . Migraine   . Pulmonary embolism affecting pregnancy     Family History  Problem Relation Age of Onset  . Hypertension Mother   . Hyperlipidemia Mother   . Arthritis Mother   . Diabetes Father   . Coronary artery disease Father   . Heart disease Father 41    MI age 38  . Hyperlipidemia Brother   . Hypertension Brother     History   Social History  . Marital Status: Married    Spouse Name: Jonni Sanger  . Number of Children: 2  . Years of Education: college   Occupational History  .  Other    Airline pilot  .  [place] History Main Topics  . Smoking status: Never Smoker   . Smokeless tobacco: Never Used  . Alcohol Use: Yes     Comment: 09/22/2014 "glass of wine maybe once/month"  . Drug Use: No  . Sexual Activity: Yes   Other Topics Concern  . Not on file   Social History Narrative   Patient lives at home with her husband Jonni Sanger). Patient works full time for [employer].    Education [employer].   Right handed.   Caffeine one cup of coffee  daily.    Allergies  Allergen Reactions  . Compazine [Prochlorperazine Edisylate] Anaphylaxis  . Reglan [Metoclopramide] Anaphylaxis and Other (See Comments)    Other reaction(s): GI Upset (intolerance) Intensifies her Chron's  . Sulfonamide Derivatives Other (See Comments)    Hematemesis; documented while a young child  . Clarithromycin Other (See Comments)    Reacts with chron's disease  . Sulfamethoxazole Rash and Other (See Comments)    Internal bleeding and kidney infection  . Biaxin [Clarithromycin] Other (See Comments)    Cant take due to Chrons disease  . Codeine Nausea And Vomiting and Other (See Comments)    Dilaudid and demerol OK  . Hydrocodone Nausea And Vomiting  . Macrobid [Nitrofurantoin] Nausea And Vomiting    Other reaction(s): GI Upset (intolerance)  . Phenergan [Promethazine Hcl] Other (See Comments)    "muscle twitches"  . Promethazine Rash and Other (See Comments)    Muscular seizures  . Promethazine Hcl Other (See Comments)    Muscular seizures  . Sulfa Antibiotics Hives and Other (See Comments)    Hematemesis; documented while a young child     Constitutional:  Denies headache, fatigue, fever or abrupt weight changes.  HEENT:  Denies eye redness, eye pain, pressure behind the eyes, facial pain, nasal congestion, ear pain, ringing in the ears, wax buildup, runny nose or sore throat. Respiratory: Positive cough. Denies difficulty breathing or shortness of breath.  Cardiovascular: Denies chest pain, chest tightness, palpitations or swelling in the hands or feet.   No other specific complaints in a complete review of systems (except as listed in HPI above).  Objective:   BP 104/70 mmHg  Pulse 87  Temp(Src) 98 F (36.7 C) (Oral)  Wt 149 lb (67.586 kg)  SpO2 99%  LMP 09/16/2014  Wt Readings from Last 3 Encounters:  07/25/15 149 lb (67.586 kg)  07/19/15 146 lb 12 oz (66.565 kg)  06/10/15 141 lb 4 oz (64.071 kg)     General: Appears her stated  age,  in NAD. HEENT: Head: normal shape and size, no sinus tenderness noted; Eyes: sclera white, no icterus, conjunctiva pink, PERRLA and EOMs intact; Ears: Tm's gray and intact, normal light reflex; Nose: mucosa pink and moist, septum midline; Throat/Mouth: + PND. Teeth present, mucosa erythematous and moist, no exudate noted, no lesions or ulcerations noted.  Neck: No cervical lymphadenopathy.  Cardiovascular: Normal rate and rhythm. S1,S2 noted.  No murmur, rubs or gallops noted.  Pulmonary/Chest: Normal effort and coarse rhonchi on the left side. No respiratory distress.      Assessment & Plan:   Cough and wheezing:  Chest xray today to r/o pna Rx for Hycodan cough syrup If xray shows infiltrate, RX for Levaquin 750 mg daily x 7 days Albuterol inhaler refilled Advised her to continue to Advair  RTC as needed or if symptoms persist.

## 2015-07-25 NOTE — Progress Notes (Signed)
Pre visit review using our clinic review tool, if applicable. No additional management support is needed unless otherwise documented below in the visit note. 

## 2015-07-25 NOTE — Telephone Encounter (Signed)
Please try to contact pt again to get more clinical info, sounds like she ay need appt to be see ASAP.

## 2015-07-25 NOTE — Telephone Encounter (Signed)
Spoke with pt and she does have productive cough with yellow phlegm; hurts in lt lung when has had coughing episode. Pt said not having any difficulty getting her breath now. Pt scheduled to see Webb Silversmith NP 07/25/2015 at 2 pm; if condition changes or worsens prior to appt pt will go to [facility] or ED.

## 2015-07-25 NOTE — Telephone Encounter (Signed)
Pt left /vm; pt was seen 07/19/15; pt requesting cough med and also possible breathing treatment due to cough, sinus infection, bronchitis and asthma problems.  Benedict. Tried x 3 to contact pt but unable to reach pt.

## 2015-07-25 NOTE — Addendum Note (Signed)
Addended by: Jearld Fenton on: 07/25/2015 02:39 PM   Modules accepted: Level of Service

## 2015-07-28 ENCOUNTER — Ambulatory Visit (INDEPENDENT_AMBULATORY_CARE_PROVIDER_SITE_OTHER): Payer: BC Managed Care – PPO | Admitting: *Deleted

## 2015-07-28 DIAGNOSIS — Z5181 Encounter for therapeutic drug level monitoring: Secondary | ICD-10-CM | POA: Diagnosis not present

## 2015-07-28 DIAGNOSIS — I2699 Other pulmonary embolism without acute cor pulmonale: Secondary | ICD-10-CM

## 2015-07-28 LAB — POCT INR: INR: 2.8

## 2015-07-28 NOTE — Progress Notes (Signed)
Pre visit review using our clinic review tool, if applicable. No additional management support is needed unless otherwise documented below in the visit note. 

## 2015-08-01 ENCOUNTER — Other Ambulatory Visit: Payer: Self-pay | Admitting: *Deleted

## 2015-08-01 MED ORDER — WARFARIN SODIUM 5 MG PO TABS: ORAL_TABLET | ORAL | Status: DC

## 2015-08-04 ENCOUNTER — Ambulatory Visit (INDEPENDENT_AMBULATORY_CARE_PROVIDER_SITE_OTHER): Payer: BC Managed Care – PPO | Admitting: *Deleted

## 2015-08-04 ENCOUNTER — Ambulatory Visit: Payer: BC Managed Care – PPO

## 2015-08-04 DIAGNOSIS — Z5181 Encounter for therapeutic drug level monitoring: Secondary | ICD-10-CM | POA: Diagnosis not present

## 2015-08-04 DIAGNOSIS — I2699 Other pulmonary embolism without acute cor pulmonale: Secondary | ICD-10-CM

## 2015-08-04 LAB — POCT INR: INR: 3.8

## 2015-08-04 NOTE — Progress Notes (Signed)
Pre visit review using our clinic review tool, if applicable. No additional management support is needed unless otherwise documented below in the visit note. 

## 2015-08-09 ENCOUNTER — Other Ambulatory Visit: Payer: Self-pay | Admitting: Family Medicine

## 2015-08-09 ENCOUNTER — Encounter: Payer: Self-pay | Admitting: Internal Medicine

## 2015-08-09 ENCOUNTER — Other Ambulatory Visit (INDEPENDENT_AMBULATORY_CARE_PROVIDER_SITE_OTHER): Payer: BC Managed Care – PPO

## 2015-08-09 ENCOUNTER — Ambulatory Visit (INDEPENDENT_AMBULATORY_CARE_PROVIDER_SITE_OTHER): Payer: BC Managed Care – PPO | Admitting: Internal Medicine

## 2015-08-09 VITALS — BP 110/74 | HR 64 | Ht 64.0 in | Wt 151.8 lb

## 2015-08-09 DIAGNOSIS — R197 Diarrhea, unspecified: Secondary | ICD-10-CM | POA: Diagnosis not present

## 2015-08-09 DIAGNOSIS — K5090 Crohn's disease, unspecified, without complications: Secondary | ICD-10-CM

## 2015-08-09 DIAGNOSIS — D849 Immunodeficiency, unspecified: Secondary | ICD-10-CM

## 2015-08-09 DIAGNOSIS — D899 Disorder involving the immune mechanism, unspecified: Secondary | ICD-10-CM

## 2015-08-09 LAB — COMPREHENSIVE METABOLIC PANEL
ALBUMIN: 3.9 g/dL (ref 3.5–5.2)
ALT: 20 U/L (ref 0–35)
AST: 14 U/L (ref 0–37)
Alkaline Phosphatase: 76 U/L (ref 39–117)
BILIRUBIN TOTAL: 0.2 mg/dL (ref 0.2–1.2)
BUN: 16 mg/dL (ref 6–23)
CALCIUM: 8.7 mg/dL (ref 8.4–10.5)
CHLORIDE: 106 meq/L (ref 96–112)
CO2: 25 mEq/L (ref 19–32)
CREATININE: 0.7 mg/dL (ref 0.40–1.20)
GFR: 98.71 mL/min (ref >60.00)
Glucose, Bld: 103 mg/dL — ABNORMAL HIGH (ref 70–99)
Potassium: 3.7 mEq/L (ref 3.5–5.1)
SODIUM: 139 meq/L (ref 135–145)
TOTAL PROTEIN: 6.5 g/dL (ref 6.0–8.3)

## 2015-08-09 LAB — CBC WITH DIFFERENTIAL/PLATELET
BASOS PCT: 0.3 % (ref 0.0–3.0)
Basophils Absolute: 0 10*3/uL (ref 0.0–0.1)
EOS ABS: 0.1 10*3/uL (ref 0.0–0.7)
EOS PCT: 0.7 % (ref 0.0–5.0)
HEMATOCRIT: 30.7 % — AB (ref 36.0–46.0)
HEMOGLOBIN: 9.8 g/dL — AB (ref 12.0–15.0)
LYMPHS PCT: 18.6 % (ref 12.0–46.0)
Lymphs Abs: 2.6 10*3/uL (ref 0.7–4.0)
MCHC: 31.9 g/dL (ref 30.0–36.0)
MCV: 75 fl — AB (ref 78.0–100.0)
MONOS PCT: 3.2 % (ref 3.0–12.0)
Monocytes Absolute: 0.5 10*3/uL (ref 0.1–1.0)
NEUTROS ABS: 10.9 10*3/uL — AB (ref 1.4–7.7)
Neutrophils Relative %: 77.2 % — ABNORMAL HIGH (ref 43.0–77.0)
PLATELETS: 395 10*3/uL (ref 150.0–400.0)
RBC: 4.09 Mil/uL (ref 3.87–5.11)
RDW: 19.2 % — AB (ref 11.5–15.5)
WBC: 14.1 10*3/uL — AB (ref 4.0–10.5)

## 2015-08-09 LAB — IBC PANEL
Iron: 19 ug/dL — ABNORMAL LOW (ref 42–145)
Saturation Ratios: 3.4 % — ABNORMAL LOW (ref 20.0–50.0)
TRANSFERRIN: 394 mg/dL — AB (ref 212.0–360.0)

## 2015-08-09 LAB — SEDIMENTATION RATE: SED RATE: 21 mm/h (ref 0–22)

## 2015-08-09 LAB — POCT INR: INR: 4.8

## 2015-08-09 LAB — PROTIME-INR
INR: 4.8 ratio — AB (ref 0.8–1.0)
Prothrombin Time: 50.8 s — ABNORMAL HIGH (ref 9.6–13.1)

## 2015-08-09 LAB — VITAMIN B12: Vitamin B-12: 244 pg/mL (ref 211–911)

## 2015-08-09 MED ORDER — TRAMADOL HCL 50 MG PO TABS: 50.0000 mg | ORAL_TABLET | Freq: Four times a day (QID) | ORAL | Status: DC | PRN

## 2015-08-09 MED ORDER — RANITIDINE HCL 150 MG PO TABS: 150.0000 mg | ORAL_TABLET | Freq: Every morning | ORAL | Status: DC

## 2015-08-09 MED ORDER — DICYCLOMINE HCL 10 MG PO CAPS: 10.0000 mg | ORAL_CAPSULE | Freq: Three times a day (TID) | ORAL | Status: DC

## 2015-08-09 NOTE — Patient Instructions (Addendum)
You have been scheduled for a CT scan of the abdomen and pelvis at [facility] ([address]---this is in the same building as Press [facility]).   You are scheduled on 08/11/2015 at 1:00pm . You should arrive 15 minutes prior to your appointment time for registration. Please follow the written instructions below on the day of your exam:  WARNING: IF YOU ARE ALLERGIC TO IODINE/X-RAY DYE, PLEASE NOTIFY RADIOLOGY IMMEDIATELY AT [phone]! YOU WILL BE GIVEN A 13 HOUR PREMEDICATION PREP.  1) Do not eat or drink anything after 9am (4 hours prior to your test) 2) You have been given 2 bottles of oral contrast to drink. The solution may taste               better if refrigerated, but do NOT add ice or any other liquid to this solution. Shake             well before drinking.    Drink 1 bottle of contrast @ 11am (2 hours prior to your exam)  Drink 1 bottle of contrast @ 12pm (1 hour prior to your exam)  You may take any medications as prescribed with a small amount of water except for the following: Metformin, Glucophage, Glucovance, Avandamet, Riomet, Fortamet, Actoplus Met, Janumet, Glumetza or Metaglip. The above medications must be held the day of the exam AND 48 hours after the exam.  The purpose of you drinking the oral contrast is to aid in the visualization of your intestinal tract. The contrast solution may cause some diarrhea. Before your exam is started, you will be given a small amount of fluid to drink. Depending on your individual set of symptoms, you may also receive an intravenous injection of x-ray contrast/dye. Plan on being at [facility] for 30 minutes or long, depending on the type of exam you are having performed.  This test typically takes 30-45 minutes to complete.  If you have any questions regarding your exam or if you need to reschedule, you may call the CT department at [phone] between the hours of 8:00 am and 5:00 pm,  Monday-Friday.  ________________________________________________________________________  Go to the basement for labs today Medications sent to pharmacy  Dr Camillia Herter

## 2015-08-09 NOTE — Telephone Encounter (Signed)
Last office visit 07/25/2015.  Last refilled 07/07/2015 for #30 with no refills.  Ok to refill?

## 2015-08-09 NOTE — Telephone Encounter (Signed)
Ambien call into [place].

## 2015-08-09 NOTE — Progress Notes (Signed)
[name] 10-02-1976 [id number]  Note: This dictation was prepared with Dragon digital system. Any transcriptional errors that result from this procedure are unintentional.   History of Present Illness: This is a  39 year old white female transferring her care from Dr. Oletta Lamas to [name] for follow-up of Crohn's disease which was diagnosed in 2002 when she underwent terminal ileal resection for obstructing  small bowel disease. Records have been requested.She had a fistula as well as abscess. She was at that time treated with the mesalamine ,steroids,Entecort and . Briefly  Remicade. She was then switched to 6-MP and has stayed on it until present time. She  Stopped taking  6-MP while being pregnant three times., 3 separate pregnancies. She just delivered her third child 8 weeks ago. She has uveitis and iritis as well as sacroiliitis, iron deficiency.reflux esophagitis by upper endoscopy in 2012. She had a normal colonoscopy in 2012 with surgical changes. She has managed flareups of Crohn's disease with the prednisone and Entecort. She is here today because of recent flareup of Crohn's disease after the delivery. She complains of crampy abdominal pain, blood in  stools and diarrhea. 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 She is currently on prednisone 20 mg daily. She had pulmonary emboli during pregnancy and therefore is on long-term Coumadin    Past Medical History  Diagnosis Date  . Crohn disease 2000  . Asthma   . Endometriosis   . Dysautonomia     recurrent syncope s/p ILR by Dr Doreatha Lew  . Palpitations   . Anxiety   . GERD (gastroesophageal reflux disease)   . Vertigo   . Anginal pain only on 09/22/2014  . H/O hiatal hernia   . Chest pain     a. s/p intermediate risk nuclear stress test on 09/23/14 and normal LHC on 09/24/14   . Chronic nausea   . Migraine   . Pulmonary embolism affecting pregnancy     Past Surgical History  Procedure Laterality Date  .  Ileocecetomy  2002 X 2  . Loop recorder implant  11/2009    by DR Doreatha Lew  . Knee arthroscopy Bilateral 1988-1990  . Cesarean section  2011    emergency  CS due to placental abruption  . Partial colectomy  2002    partial removed due to crohns  . Appendectomy  ~ 1992  . Tonsillectomy  ~ 1996  . US echocardiography  11/11/2009    EF 55-60%  . Laparoscopy for ectopic pregnancy  11/2012  . Nasal sinus surgery  ~ 2007  . Endoscopic release transverse carpal ligament of hand Right 12/2010  . Colon surgery    . Cystoscopy w/ stone manipulation  X 2  . Cesarean section  2014    "preeclampsia"  . Left heart catheterization with coronary angiogram N/A 09/24/2014    Procedure: LEFT HEART CATHETERIZATION WITH CORONARY ANGIOGRAM;  Surgeon: Leonie Man, MD;  Location: [facility] CATH LAB;  Service: Cardiovascular;  Laterality: N/A;    Allergies  Allergen Reactions  . Compazine [Prochlorperazine Edisylate] Anaphylaxis  . Reglan [Metoclopramide] Anaphylaxis and Other (See Comments)    Other reaction(s): GI Upset (intolerance) Intensifies her Chron's  . Sulfonamide Derivatives Other (See Comments)    Hematemesis; documented while a young child  . Clarithromycin Other (See Comments)    Reacts with chron's disease  . Sulfamethoxazole Rash and Other (See Comments)    Internal bleeding and kidney infection  . Biaxin [Clarithromycin] Other (  See Comments)    Cant take due to Chrons disease  . Codeine Nausea And Vomiting and Other (See Comments)    Dilaudid and demerol OK  . Hydrocodone Nausea And Vomiting  . Macrobid [Nitrofurantoin] Nausea And Vomiting    Other reaction(s): GI Upset (intolerance)  . Phenergan [Promethazine Hcl] Other (See Comments)    "muscle twitches"  . Promethazine Rash and Other (See Comments)    Muscular seizures  . Promethazine Hcl Other (See Comments)    Muscular seizures  . Sulfa Antibiotics Hives and Other (See Comments)    Hematemesis; documented while a young child     Family history and social history have been reviewed.  Review of Systems: Positive for abdominal pain. Rectal bleeding diarrhea denies fever  The remainder of the 10 point ROS is negative except as outlined in the H&P  Physical Exam: General Appearance Well developed, in no distress, slightly cushingoid Eyes  Non icteric  HEENT  Non traumatic, normocephalic  Mouth No lesion, tongue papillated, no cheilosis no aphthous ulcers Neck Supple without adenopathy, thyroid not enlarged, no carotid bruits, no JVD Lungs Clear to auscultation bilaterally COR Normal S1, normal S2, regular rhythm, no murmur, quiet precordium Abdomen soft relaxed diffusely tender more so in the right lower quadrant without palpable mass or rebound. Hyperactive bowel sounds Rectal external hemorrhoidal tags. Normal rectal sphincter tone. Small amount of Hemoccult-positive stool in the ampulla Extremities  No pedal edema Skin No lesions Neurological Alert and oriented x 3 Psychological Normal mood and affect  Assessment and Plan:   39 year old white female with Crohn's disease of the terminal ileum status post terminal ileal resection in 2002 transferring  her care. We don't have any medical records . We have requested records from Dr. Oletta Lamas. The reason for transfer is so that she does not have to take 6MP because she cannot all breast-feed while she is on it. She would like to use of biologicals. She has done some reading about it and would prefer to have the injectables which she can use at home. We will try to start  Humira which is a safe in breast feeding. Today she will have QuantiFERON DBT skin test and hepatitis A,B,C serologies as well as CBC, B12 iron levels metabolic panel and sedimentation rate, PT. She will increase prednisone to 30 mg daily. We will start  dicyclomine 20 mg 3 times a day when necessary cramps. We will schedule CT scan of the abdomen and pelvis to assess activity of the disease. Eventually  need colonoscopy last exam was 2012. She will continue on probiotics and low-residue diet. She will use tramadol 50 mg when necessary abdominal pain  History of iron deficiency anemia. May need iron infusion depending on her iron levels and CBC today  Low back pain possible sacroiliitis. May need to be evaluated in the future  Gastroesophageal reflux. Continue Dexilant 60 mg at bedtime and add ranitidine 150 mg in the morning To see health extender in 3 weeks and Dr. Silverio Decamp in October We will preauthorize  for Humira    Delfin Edis 08/09/2015

## 2015-08-10 ENCOUNTER — Other Ambulatory Visit: Payer: Self-pay | Admitting: *Deleted

## 2015-08-10 ENCOUNTER — Ambulatory Visit (INDEPENDENT_AMBULATORY_CARE_PROVIDER_SITE_OTHER): Payer: BC Managed Care – PPO | Admitting: *Deleted

## 2015-08-10 DIAGNOSIS — D509 Iron deficiency anemia, unspecified: Secondary | ICD-10-CM

## 2015-08-10 DIAGNOSIS — I2699 Other pulmonary embolism without acute cor pulmonale: Secondary | ICD-10-CM

## 2015-08-10 DIAGNOSIS — Z5181 Encounter for therapeutic drug level monitoring: Secondary | ICD-10-CM

## 2015-08-10 LAB — HEPATITIS C ANTIBODY: HCV Ab: NEGATIVE

## 2015-08-10 LAB — HEPATITIS A ANTIBODY, TOTAL: Hep A Total Ab: NONREACTIVE

## 2015-08-10 LAB — HEPATITIS B SURFACE ANTIGEN: Hepatitis B Surface Ag: NEGATIVE

## 2015-08-10 LAB — HEPATITIS B SURFACE ANTIBODY,QUALITATIVE: HEP B S AB: POSITIVE — AB

## 2015-08-10 MED ORDER — CYANOCOBALAMIN 1000 MCG/ML IJ SOLN: INTRAMUSCULAR | Status: DC

## 2015-08-10 NOTE — Progress Notes (Signed)
OK Feraheme contraindicated in breast feeding.due to unknown transmission into the mothers  milk.  Please cancel Feraheme  and start Prenatal vitamin with Iron ( it has very little Iron, should not bother her stomach).

## 2015-08-10 NOTE — Progress Notes (Signed)
Pre visit review using our clinic review tool, if applicable. No additional management support is needed unless otherwise documented below in the visit note. 

## 2015-08-11 ENCOUNTER — Ambulatory Visit: Payer: BC Managed Care – PPO

## 2015-08-11 ENCOUNTER — Ambulatory Visit (INDEPENDENT_AMBULATORY_CARE_PROVIDER_SITE_OTHER)
Admission: RE | Admit: 2015-08-11 | Discharge: 2015-08-11 | Disposition: A | Discharge status: Home / Self Care | Payer: BC Managed Care – PPO | Source: Ambulatory Visit | Attending: Internal Medicine | Admitting: Internal Medicine

## 2015-08-11 DIAGNOSIS — K5090 Crohn's disease, unspecified, without complications: Secondary | ICD-10-CM | POA: Diagnosis not present

## 2015-08-11 LAB — QUANTIFERON TB GOLD ASSAY (BLOOD)
Mitogen value: 0.05 IU/mL
QUANTIFERON TB AG MINUS NIL: 0 [IU]/mL
Quantiferon Nil Value: 0.03 IU/mL
TB AG VALUE: 0.03 [IU]/mL

## 2015-08-11 MED ORDER — IOHEXOL 300 MG/ML  SOLN
100.0000 mL | Freq: Once | INTRAMUSCULAR | Status: AC | PRN
  Administered 2015-08-11: 100 mL via INTRAVENOUS

## 2015-08-12 ENCOUNTER — Telehealth: Payer: Self-pay | Admitting: *Deleted

## 2015-08-12 ENCOUNTER — Other Ambulatory Visit: Payer: Self-pay | Admitting: *Deleted

## 2015-08-12 MED ORDER — INTEGRA PLUS PO CAPS: ORAL_CAPSULE | ORAL | Status: DC

## 2015-08-12 MED ORDER — DICYCLOMINE HCL 20 MG PO TABS: ORAL_TABLET | ORAL | Status: DC

## 2015-08-12 NOTE — Telephone Encounter (Signed)
She is breast feeding and she is allergic to Codeine and Hydrocodone, so narcotics are out of question. She should stay on full liquid/ soft diet and increase Dicyclomine to 20 mg po tid.

## 2015-08-12 NOTE — Telephone Encounter (Signed)
Patient given recommendations. rx sent to pharmacy.

## 2015-08-12 NOTE — Telephone Encounter (Signed)
Patient states the tramadol is not helping her back pain. She is asking for something else. Please, advise.

## 2015-08-17 ENCOUNTER — Encounter (HOSPITAL_COMMUNITY): Payer: BC Managed Care – PPO

## 2015-08-18 ENCOUNTER — Telehealth: Payer: Self-pay | Admitting: Internal Medicine

## 2015-08-18 NOTE — Telephone Encounter (Signed)
Received records from Dr. Edwards/[facility] forwarded 33 pages to Dr. Delfin Edis 08/18/15 fbg.

## 2015-08-23 ENCOUNTER — Encounter: Payer: Self-pay | Admitting: Physician Assistant

## 2015-08-23 ENCOUNTER — Ambulatory Visit: Payer: BC Managed Care – PPO | Admitting: Physician Assistant

## 2015-08-23 ENCOUNTER — Ambulatory Visit (INDEPENDENT_AMBULATORY_CARE_PROVIDER_SITE_OTHER): Payer: BC Managed Care – PPO | Admitting: Physician Assistant

## 2015-08-23 VITALS — BP 118/76 | HR 88 | Ht 62.8 in | Wt 148.1 lb

## 2015-08-23 DIAGNOSIS — K5010 Crohn's disease of large intestine without complications: Secondary | ICD-10-CM | POA: Diagnosis not present

## 2015-08-23 DIAGNOSIS — Z7901 Long term (current) use of anticoagulants: Secondary | ICD-10-CM

## 2015-08-23 DIAGNOSIS — D509 Iron deficiency anemia, unspecified: Secondary | ICD-10-CM | POA: Diagnosis not present

## 2015-08-23 MED ORDER — TRAMADOL HCL 50 MG PO TABS: 50.0000 mg | ORAL_TABLET | Freq: Four times a day (QID) | ORAL | Status: DC | PRN

## 2015-08-23 NOTE — Progress Notes (Signed)
Patient ID: [name], female   DOB: 1976-07-28, 39 y.o.   MRN: [id number]   Subjective:    Patient ID: [name], female    DOB: 1976/03/10, 39 y.o.   MRN: [id number]  [place] is a pleasant 39 year old white female who just established with our practice about 2 weeks ago when she saw Dr. Delfin Edis for the first time. She was transferring her care from Dr. Leonie Douglas. She has history of Crohn's ileocolitis which was diagnosed in 2002. She underwent a terminal ilial resection for obstructing disease. She apparently also has had fistulous disease in the past. He has been treated since that time with mesalamine steroids Entocort and was initiated on Remicade but did not follow through induction. She has been on 6-MP for the majority of the time interrupted only for pregnancy. She just delivered her third child about 10 weeks ago and has been off of 6-MP during pregnancy and since. An Other associated problems have included iron deficiency anemia uveitis iritis and sacroiliitis. Last colonoscopy 2012 negative. Patient is currently nursing and would like to continue nursing for a year. She had started herself on prednisone at 20 mg by mouth daily shortly after she delivered her child and this was increased to 30 mg daily about 2 weeks ago. She is still having significant symptoms with lower abdominal cramping pain and frequent bowel movements. She says her stools are still diarrheal and she sees bright red blood with each bowel movement and continues to have urgency. She says she feels like she has had some improvement since increasing the steroids but still doesn't feel well and has ongoing complaints of back pain which she says she always has with flares as well. Initiating biologic was discussed at her last appointment, and she would like to proceed with this. We not have not received approval from her insurance as yet. She did have hepatitis serologies done which were negative, QuantiFERON  Gold returned indeterminate but the patient was on steroids at that time. Recent chest x-ray negative She was also determined to be iron deficient most recent hemoglobin 9.8 and she is started on Integra plus once daily.  Review of Systems Pertinent positive and negative review of systems were noted in the above HPI section.  All other review of systems was otherwise negative.  Outpatient Encounter Prescriptions as of 08/23/2015  Medication Sig  . acetaminophen (TYLENOL) 500 MG tablet Take 1,000 mg by mouth every 6 (six) hours as needed for headache.  . albuterol (PROVENTIL HFA;VENTOLIN HFA) 108 (90 BASE) MCG/ACT inhaler Inhale 2 puffs into the lungs every 6 (six) hours as needed for wheezing or shortness of breath.  . Cetirizine HCl (ZYRTEC ALLERGY PO) Take by mouth.  . cyanocobalamin (,VITAMIN B-12,) 1000 MCG/ML injection Inject 1000 mcg IM weekly x 4 then every 2 weeks. Please dispense one mil vials and 1" x 25 G needles and 3 cc syringes.  Marland Kitchen dexlansoprazole (DEXILANT) 60 MG capsule Take 60 mg by mouth daily.  Marland Kitchen dicyclomine (BENTYL) 20 MG tablet Take one po TID  . FeFum-FePoly-FA-B Cmp-C-Biot (INTEGRA PLUS) CAPS Take one po daily  . lactobacillus acidophilus (BACID) TABS tablet Take 2 tablets by mouth 3 (three) times daily.  . predniSONE (DELTASONE) 20 MG tablet Take 1 tablet (20 mg total) by mouth daily with breakfast.  . Prenatal Vit-Fe Fumarate-FA (PRENATAL PO) Take 2 tablets by mouth every evening. Prenatal gummy vitamin.  . ranitidine (ZANTAC) 150 MG tablet Take 1 tablet (150  mg total) by mouth every morning.  . sertraline (ZOLOFT) 50 MG tablet TAKE 1 TABLET BY MOUTH DAILY  . traMADol (ULTRAM) 50 MG tablet Take 1 tablet (50 mg total) by mouth every 6 (six) hours as needed.  . warfarin (COUMADIN) 5 MG tablet Take as directed by Anti-coagulation Clinic.  Marland Kitchen zolpidem (AMBIEN) 10 MG tablet TAKE ONE TABLET BY MOUTH AT BEDTIME  . [DISCONTINUED] traMADol (ULTRAM) 50 MG tablet Take 1 tablet (50  mg total) by mouth every 6 (six) hours as needed.   No facility-administered encounter medications on file as of 08/23/2015.   Allergies  Allergen Reactions  . Compazine [Prochlorperazine Edisylate] Anaphylaxis  . Reglan [Metoclopramide] Anaphylaxis and Other (See Comments)    Other reaction(s): GI Upset (intolerance) Intensifies her Chron's  . Sulfonamide Derivatives Other (See Comments)    Hematemesis; documented while a young child  . Clarithromycin Other (See Comments)    Reacts with chron's disease  . Sulfamethoxazole Rash and Other (See Comments)    Internal bleeding and kidney infection  . Biaxin [Clarithromycin] Other (See Comments)    Cant take due to Chrons disease  . Codeine Nausea And Vomiting and Other (See Comments)    Dilaudid and demerol OK  . Hydrocodone Nausea And Vomiting  . Macrobid [Nitrofurantoin] Nausea And Vomiting    Other reaction(s): GI Upset (intolerance)  . Phenergan [Promethazine Hcl] Other (See Comments)    "muscle twitches"  . Promethazine Rash and Other (See Comments)    Muscular seizures  . Promethazine Hcl Other (See Comments)    Muscular seizures  . Sulfa Antibiotics Hives and Other (See Comments)    Hematemesis; documented while a young child   Patient Active Problem List   Diagnosis Date Noted  . High risk medication use 07/19/2015  . Long-term (current) use of anticoagulants 07/19/2015  . Depression, major, recurrent, moderate 06/10/2015  . Breast feeding status of mother 06/10/2015  . Severe hyperemesis gravidarum   . History of hyperemesis gravidarum 11/08/2014  . Abnormal nuclear stress test 09/24/2014  . Dysautonomia   . Palpitations   . HLD (hyperlipidemia) 09/23/2014  . Elevated LFTs 10/21/2012  . Anxiety 10/06/2012  . BPPV (benign paroxysmal positional vertigo) 03/15/2012  . Crohn's disease 07/27/2011  . Reactive arthritis due to crohn's flares 07/27/2011  . Asthma, moderate persistent 07/27/2011  . GERD (gastroesophageal  reflux disease) 07/27/2011  . Allergic rhinitis 07/27/2011  . Endometriosis 07/27/2011  . Migraine 07/27/2011  . Calcium nephrolithiasis 07/27/2011  . Syncope 11/10/2010   Social History   Social History  . Marital Status: Married    Spouse Name: Jonni Sanger  . Number of Children: 2  . Years of Education: college   Occupational History  .  Other    Airline pilot  .  [place] History Main Topics  . Smoking status: Never Smoker   . Smokeless tobacco: Never Used  . Alcohol Use: Yes     Comment: 09/22/2014 "glass of wine maybe once/month"  . Drug Use: No  . Sexual Activity: Yes   Other Topics Concern  . Not on file   Social History Narrative   Patient lives at home with her husband Jonni Sanger). Patient works full time for [employer].    Education [employer].   Right handed.   Caffeine one cup of coffee daily.    Ms. Blanchard family history includes Arthritis in her mother; Coronary artery disease in her father; Diabetes in her father; Heart disease (age of  onset: 77) in her father; Hyperlipidemia in her brother and mother; Hypertension in her brother and mother.      Objective:    Filed Vitals:   08/23/15 1520  BP: 118/76  Pulse: 88    Physical Exam  well-developed young white female in no acute distress, pleasant blood pressure 118/76 pulse 88 height 5 foot 2 weight 148. HEENT; nontraumatic normocephalic EOMI PERRLA sclera anicteric, Neck ;supple no JVD, Cardiovascular; regular rate and rhythm with S1-S2 no murmur or gallop, Pulmonary ;clear bilaterally, Abdomen; soft is tender bilaterally across lower abdomen there is no guarding or rebound no palpable mass or hepatosplenomegaly bowel sounds are present, Rectal ;exam not done, Ext; no clubbing cyanosis or edema skin warm and dry, Neuropsych ;mood and affect appropriate       Assessment & Plan:   #1 39 yo female with Crohns ileocolitis with exacerbation since delivery of 3rd child 10 weeks  ago-  Some improvement with increased dose of prednisone   Of  6MP with pregnancy and now nursing Plan is to initiate Biologic- Humira  #2 iron deficiency anemia- just started Integra plus #3 sacroiliitis -active  Plan; Increase Prednisone to 40 mg po qam  Long discussion with pt regarding nursing in setting of biologic- little data but will cross in breast milk- I suggested she consider weaning within  next month- she will consider   Will follow up in a couple weeks and also may get her established with Dr Havery Moros so can assume care rather than waiting  For Dr Silverio Decamp  Will follow up on Humira approval as well   Amy Genia Harold PA-C 08/23/2015   Cc: Jinny Sanders, MD

## 2015-08-23 NOTE — Patient Instructions (Addendum)
Take Prednisone to 40 mg by mouth daily. We sent refills for Ultram to your pharmacy [facility], [place], [place].   Need flu shot when available.  Follow up with Nicoletta Ba PA.

## 2015-08-24 ENCOUNTER — Ambulatory Visit (INDEPENDENT_AMBULATORY_CARE_PROVIDER_SITE_OTHER): Payer: BC Managed Care – PPO | Admitting: Family Medicine

## 2015-08-24 ENCOUNTER — Telehealth: Payer: Self-pay | Admitting: Family Medicine

## 2015-08-24 ENCOUNTER — Encounter: Payer: Self-pay | Admitting: Family Medicine

## 2015-08-24 ENCOUNTER — Telehealth: Payer: Self-pay | Admitting: *Deleted

## 2015-08-24 ENCOUNTER — Encounter (HOSPITAL_COMMUNITY): Payer: BC Managed Care – PPO

## 2015-08-24 VITALS — BP 118/76 | HR 106 | Temp 98.1°F | Ht 64.0 in | Wt 148.5 lb

## 2015-08-24 DIAGNOSIS — H1131 Conjunctival hemorrhage, right eye: Secondary | ICD-10-CM | POA: Insufficient documentation

## 2015-08-24 DIAGNOSIS — Z5181 Encounter for therapeutic drug level monitoring: Secondary | ICD-10-CM | POA: Diagnosis not present

## 2015-08-24 DIAGNOSIS — Z7901 Long term (current) use of anticoagulants: Secondary | ICD-10-CM

## 2015-08-24 DIAGNOSIS — I2699 Other pulmonary embolism without acute cor pulmonale: Secondary | ICD-10-CM

## 2015-08-24 LAB — POCT INR: INR: 5

## 2015-08-24 NOTE — Progress Notes (Signed)
Subjective:    Patient ID: [name], female    DOB: 23-Jun-1976, 39 y.o.   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 High risk medication use 07/19/2015  . Long-term (current) use of anticoagulants 07/19/2015  . Depression, major, recurrent, moderate 06/10/2015  . Breast feeding status of mother 06/10/2015  . Severe hyperemesis gravidarum   . History of hyperemesis gravidarum 11/08/2014  . Abnormal nuclear stress test 09/24/2014  . Dysautonomia   . Palpitations   . HLD (hyperlipidemia) 09/23/2014  . Elevated LFTs 10/21/2012  . Anxiety 10/06/2012  . BPPV (benign paroxysmal positional vertigo) 03/15/2012  . Crohn's disease 07/27/2011  . Reactive arthritis due to crohn's flares 07/27/2011  . Asthma, moderate persistent 07/27/2011  . GERD (gastroesophageal reflux disease) 07/27/2011  . Allergic rhinitis 07/27/2011  . Endometriosis 07/27/2011  . Migraine 07/27/2011  . Calcium nephrolithiasis 07/27/2011  . Syncope 11/10/2010   Past Medical History  Diagnosis Date  . Crohn disease 2000  . Asthma   . Endometriosis   . Dysautonomia     recurrent syncope s/p ILR by Dr Doreatha Lew  . Palpitations   . Anxiety   . GERD (gastroesophageal reflux disease)   . Vertigo   . Anginal pain only on 09/22/2014  . H/O hiatal hernia   . Chest pain     a. s/p intermediate risk nuclear stress test on 09/23/14 and normal LHC on 09/24/14   . Chronic nausea   . Migraine   . Pulmonary embolism affecting pregnancy    Past Surgical  History  Procedure Laterality Date  . Ileocecetomy  2002 X 2  . Loop recorder implant  11/2009    by DR Doreatha Lew  . Knee arthroscopy Bilateral 1988-1990  . Cesarean section  2011    emergency  CS due to placental abruption  . Partial colectomy  2002    partial removed due to crohns  . Appendectomy  ~ 1992  . Tonsillectomy  ~ 1996  . US echocardiography  11/11/2009    EF 55-60%  . Laparoscopy for ectopic pregnancy  11/2012  . Nasal sinus surgery  ~ 2007  . Endoscopic release transverse carpal ligament of hand Right 12/2010  . Colon surgery    . Cystoscopy w/ stone manipulation  X 2  . Cesarean section  2014    "preeclampsia"  . Left heart catheterization with coronary angiogram N/A 09/24/2014    Procedure: LEFT HEART CATHETERIZATION WITH CORONARY ANGIOGRAM;  Surgeon: Leonie Man, MD;  Location: [facility] CATH LAB;  Service: Cardiovascular;  Laterality: N/A;   Social History  Substance Use Topics  . Smoking status: Never Smoker   . Smokeless tobacco: Never Used  . Alcohol Use: 0.0 oz/week    0 Standard drinks or equivalent per week     Comment: 09/22/2014 "glass of wine maybe once/month"   Family History  Problem Relation Age of Onset  . Hypertension Mother   . Hyperlipidemia Mother   . Arthritis Mother   . Diabetes Father   . Coronary artery disease Father   . Heart disease Father 61    MI age 6  . Hyperlipidemia Brother   . Hypertension Brother    Allergies  Allergen Reactions  . Compazine [Prochlorperazine Edisylate] Anaphylaxis  . Reglan [Metoclopramide] Anaphylaxis and Other (See Comments)    Other reaction(s): GI Upset (intolerance) Intensifies her Chron's  . Sulfonamide Derivatives Other (See Comments)    Hematemesis; documented while a young child  . Clarithromycin Other (See Comments)    Reacts with chron's disease  . Sulfamethoxazole Rash and Other (See Comments)    Internal bleeding and kidney infection  . Biaxin [Clarithromycin] Other (See Comments)     Cant take due to Chrons disease  . Codeine Nausea And Vomiting and Other (See Comments)    Dilaudid and demerol OK  . Hydrocodone Nausea And Vomiting  . Macrobid [Nitrofurantoin] Nausea And Vomiting    Other reaction(s): GI Upset (intolerance)  . Phenergan [Promethazine Hcl] Other (See Comments)    "muscle twitches"  . Promethazine Rash and Other (See Comments)    Muscular seizures  . Promethazine Hcl Other (See Comments)    Muscular seizures  . Sulfa Antibiotics Hives and Other (See Comments)    Hematemesis; documented while a young child   Current Outpatient Prescriptions on File Prior to Visit  Medication Sig Dispense Refill  . acetaminophen (TYLENOL) 500 MG tablet Take 1,000 mg by mouth every 6 (six) hours as needed for headache.    . albuterol (PROVENTIL HFA;VENTOLIN HFA) 108 (90 BASE) MCG/ACT inhaler Inhale 2 puffs into the lungs every 6 (six) hours as needed for wheezing or shortness of breath. 1 Inhaler 2  . Cetirizine HCl (ZYRTEC ALLERGY PO) Take 1 tablet by mouth daily.     . cyanocobalamin (,VITAMIN B-12,) 1000 MCG/ML injection Inject 1000 mcg IM weekly x 4 then every 2 weeks. Please dispense one mil vials and 1" x 25 G needles and 3 cc syringes. 4 mL 6  . dexlansoprazole (DEXILANT) 60 MG capsule Take 60 mg by mouth daily.    Marland Kitchen dicyclomine (BENTYL) 20 MG tablet Take one po TID 90 tablet 0  . FeFum-FePoly-FA-B Cmp-C-Biot (INTEGRA PLUS) CAPS Take one po daily 30 capsule 1  . lactobacillus acidophilus (BACID) TABS tablet Take 2 tablets by mouth 3 (three) times daily.    . Prenatal Vit-Fe Fumarate-FA (PRENATAL PO) Take 2 tablets by mouth every evening. Prenatal gummy vitamin.    . ranitidine (ZANTAC) 150 MG tablet Take 1 tablet (150 mg total) by mouth every morning. 30 tablet 3  . sertraline (ZOLOFT) 50 MG tablet TAKE 1 TABLET BY MOUTH DAILY 30 tablet 5  . traMADol (ULTRAM) 50 MG tablet Take 1 tablet (50 mg total) by mouth every 6 (six) hours as needed. 50 tablet 1  . warfarin  (COUMADIN) 5 MG tablet Take as directed by Anti-coagulation Clinic. 40 tablet 0  . zolpidem (AMBIEN) 10 MG tablet TAKE ONE TABLET BY MOUTH AT BEDTIME 30 tablet 0   No current facility-administered medications on file prior to visit.    Review of Systems Review of Systems  Constitutional: Negative for fever, appetite change, fatigue and unexpected weight change.  Eyes: Negative for pain and visual disturbance. pos for R eye redness and discomfort ENT neg for facial pain or congestion  Respiratory: Negative for cough and shortness of  breath.   Cardiovascular: Negative for cp or palpitations    Gastrointestinal: Negative for nausea, diarrhea and constipation.  Genitourinary: Negative for urgency and frequency.  Skin: Negative for pallor or rash   Neurological: Negative for weakness, light-headedness, numbness and headaches.  Hematological: Negative for adenopathy. Does not bruise/bleed easily.  Psychiatric/Behavioral: Negative for dysphoric mood. The patient is not nervous/anxious.         Objective:   Physical Exam  Constitutional: She appears well-developed and well-nourished. No distress.  Well appearing   HENT:  Head: Normocephalic and atraumatic.  Right Ear: External ear normal.  Left Ear: External ear normal.  Nose: Nose normal.  Mouth/Throat: Oropharynx is clear and moist.  Eyes: EOM are normal. Pupils are equal, round, and reactive to light. Right eye exhibits no discharge. Left eye exhibits no discharge. No scleral icterus.  Sub conjunctival hematoma R eye medially to the iris  No swelling  No drainage or vision change   Neck: Normal range of motion. Neck supple.  Cardiovascular: Normal rate and regular rhythm.   Pulmonary/Chest: Effort normal and breath sounds normal.  Musculoskeletal: She exhibits no edema.  Lymphadenopathy:    She has no cervical adenopathy.  Neurological: She is alert. She has normal reflexes. No cranial nerve deficit.  Skin: Skin is warm and dry.  No rash noted. No pallor.  No ecchymosis   Psychiatric: She has a normal mood and affect.          Assessment & Plan:   Problem List Items Addressed This Visit    Conjunctival hemorrhage of right eye - Primary    Spontaneous and without trauma in pt who is on chronic anticoagulation (coumadin)  Check INR today = still high at 5.0 inst to hold coumadin tomorrow (last dose was this am) and we will inst further from there  Will use cool compresses if helpful/ avoid prolonged straining and expect to resolve on its own If symptoms worsen or any vision change - inst to call f/u or seek care after hours         Long-term (current) use of anticoagulants    INR is still elevated at 5.0  Has sub conj hemorrhage in R eye as well  inst to hold coumadin tomorrow (last dose was this am) and will inst further from there  No other spontaneous bleeding        Other Visit Diagnoses    Encounter for therapeutic drug monitoring        Acute pulmonary embolism

## 2015-08-24 NOTE — Telephone Encounter (Signed)
Patient Name: [name] DOB: [birth year] Initial Comment Caller states c/o busted blood vessel in eye. If using secondary number, please let the person answering the phone know that this is a dr office calling back Nurse Assessment Nurse: Ronnald Ramp, RN, Miranda Date/Time ([place] Time): 08/24/2015 11:58:29 AM Confirm and document reason for call. If symptomatic, describe symptoms. ---Caller states this morning her eye was hurting and has redness across the sclera of her right. No known trauma but she is on Coumadin. Has the patient traveled out of the country within the last 30 days? ---Not Applicable Does the patient require triage? ---Yes Related visit to physician within the last 2 weeks? ---No Does the PT have any chronic conditions? (i.e. diabetes, asthma, etc.) ---Yes List chronic conditions. ---HX of PE, Crohn's Did the patient indicate they were pregnant? ---No Guidelines Guideline Title Affirmed Question Affirmed Notes Eye - Red Without Pus [1] Bleeding on white of the eye AND [2] taking Coumadin (warfarin) or other strong blood thinner, or known bleeding disorder (e.g., thrombocytopenia) Final Disposition User See Physician within 24 Hours Ronnald Ramp, RN, Miranda Comments Reviewed pt INR history in chart. INR has been climbing each week. Last week INR was 4.8. She is scheduled to have it checked tomorrow. Called back line and spoke [place] and discussed concerns because of INR level last week. She will discuss with MD and contact pt if she needs to be seen today. Referrals REFERRED TO PCP OFFICE Disagree/Comply: Comply

## 2015-08-24 NOTE — Assessment & Plan Note (Signed)
Spontaneous and without trauma in pt who is on chronic anticoagulation (coumadin)  Check INR today = still high at 5.0 inst to hold coumadin tomorrow (last dose was this am) and we will inst further from there  Will use cool compresses if helpful/ avoid prolonged straining and expect to resolve on its own If symptoms worsen or any vision change - inst to call f/u or seek care after hours

## 2015-08-24 NOTE — Telephone Encounter (Signed)
-----   Message from Alfredia Ferguson, PA-C sent at 08/23/2015  5:40 PM EDT ----- [name] - 2 things , has Humira been approved for this pt ? ... And will you please call her and let her know Dr Silverio Decamp has no appts until into November . i saw her today . i would like her to establish with dr Lorina Rabon who, assure her has IBD experience and a specific interest in IBD.  She is still sick and tell her I want her to get in with him  soon - if  she is ok with that make appt for 2 weeks from now or thereabouts.. thanks

## 2015-08-24 NOTE — Assessment & Plan Note (Signed)
INR is still elevated at 5.0  Has sub conj hemorrhage in R eye as well  inst to hold coumadin tomorrow (last dose was this am) and will inst further from there  No other spontaneous bleeding

## 2015-08-24 NOTE — Progress Notes (Signed)
Pre visit review using our clinic review tool, if applicable. No additional management support is needed unless otherwise documented below in the visit note. 

## 2015-08-24 NOTE — Telephone Encounter (Signed)
I will see her then

## 2015-08-24 NOTE — Telephone Encounter (Signed)
Left a message for patient to call back. 

## 2015-08-24 NOTE — Telephone Encounter (Signed)
Randi nurse with [facility] called after pt had hung up and pt has burst blood vessel in rt eye; pt is on coumadin and last INR on 08/10/15 was 4.8. Dr Diona Browner and Barnett Applebaum RN is out of office today and Dr Glori Bickers will see pt today at 4 PM. (1st time pt could come to office.)

## 2015-08-24 NOTE — Patient Instructions (Signed)
You can use a cool compress on your eye - I expect it to improve  If your vision change or you develop worsening eye pain please let me know INR today

## 2015-08-25 ENCOUNTER — Ambulatory Visit (INDEPENDENT_AMBULATORY_CARE_PROVIDER_SITE_OTHER): Payer: BC Managed Care – PPO | Admitting: *Deleted

## 2015-08-25 ENCOUNTER — Telehealth: Payer: Self-pay | Admitting: *Deleted

## 2015-08-25 DIAGNOSIS — K50918 Crohn's disease, unspecified, with other complication: Secondary | ICD-10-CM

## 2015-08-25 NOTE — Telephone Encounter (Signed)
Spoke with patient and she agrees to see Dr. Havery Moros but needs an afternoon OV. Scheduled on 10/18/15 at 4:00 PM. Nicoletta Ba, PA aware of this and patient will keep ov with her prior to this. Humira forms to Dr. Havery Moros for review and to sign.

## 2015-08-25 NOTE — Progress Notes (Signed)
I have reviewed the note and her chart, as well as recent CT scan. I agree she needs to ultimately be on a biologic, and likely combination therapy with immunomodulator as well if she is agreeable to this in the future. For now, I would recommend obtaining stool for C Diff and repeating quantiferon gold. If she is positive for C diff she will need treatment. Otherwise, once C diff negative and quantiferon returns she should start Humira pending no abnormalities with the test. I would recommend prednisone 41m for 2 weeks and then taper by 539mper week, hopefully she is on Humira by then. She also has a CT scan suggesting a colonic stricture that also needs to be evaluated by colonoscopy, however would prefer to get her disease under control prior to endoscopy. I will see her in clinic to discuss this. If she has fevers, worsening pain or diarrhea she needs to call in the interim. Ultimately our goal will be to have her in steroid free remission and avoid use of steroids in the future.

## 2015-08-25 NOTE — Telephone Encounter (Signed)
Manus Gunning, MD at 08/25/2015 11:56 AM     Status: Signed       Expand All Collapse All   I have reviewed the note and her chart, as well as recent CT scan. I agree she needs to ultimately be on a biologic, and likely combination therapy with immunomodulator as well if she is agreeable to this in the future. For now, I would recommend obtaining stool for C Diff and repeating quantiferon gold. If she is positive for C diff she will need treatment. Otherwise, once C diff negative and quantiferon returns she should start Humira pending no abnormalities with the test. I would recommend prednisone 27m for 2 weeks and then taper by 554mper week, hopefully she is on Humira by then. She also has a CT scan suggesting a colonic stricture that also needs to be evaluated by colonoscopy, however would prefer to get her disease under control prior to endoscopy. I will see her in clinic to discuss this. If she has fevers, worsening pain or diarrhea she needs to call in the interim. Ultimately our goal will be to have her in steroid free remission and avoid use of steroids in the future.            Labs in [facility]. Left a message for patient to call back.

## 2015-08-25 NOTE — Progress Notes (Signed)
Pre visit review using our clinic review tool, if applicable. No additional management support is needed unless otherwise documented below in the visit note. 

## 2015-08-25 NOTE — Telephone Encounter (Signed)
Left a message for patient to call back. 

## 2015-08-26 ENCOUNTER — Telehealth: Payer: Self-pay | Admitting: *Deleted

## 2015-08-26 NOTE — Telephone Encounter (Signed)
Received verification that [facility] approved the prescription for Dexilant Cap were approved. Good 07-26-2015 through 08-24-2016. Case ID [id number].

## 2015-08-26 NOTE — Telephone Encounter (Signed)
I believe she held dose yesterday on 08/25/2015, have her  restart with 5 mg daily every day.  Return for recheck in 1 week with GINA.   INR 4.8 on 8/17 to 5 on 8/31 with conjunctiva hemorrhage. 35 total mg weekly down from 37.73m

## 2015-08-26 NOTE — Telephone Encounter (Addendum)
Pt had INR labs done on 08/24/15 in the lab because Gina wasn't here at the time. Her INR was 5.0, since then Barnett Applebaum has been trying to contact pt but couldn't reach pt yesterday. Pt is returning Gina's call but Barnett Applebaum isn't here today to tell her how to take her coumadin. Please review INR and let pt know how to dose her coumadin, contact #s are listed

## 2015-08-26 NOTE — Telephone Encounter (Signed)
Left message for [name] to return my call.

## 2015-08-26 NOTE — Telephone Encounter (Signed)
Sam notified as instructed by telephone.  She will restart Coumadin 5 mg daily tomorrow.  Coumadin Clinic Appointment scheduled for 09/01/2015 at 4:15 pm with Barnett Applebaum.

## 2015-08-30 ENCOUNTER — Other Ambulatory Visit: Payer: Self-pay | Admitting: Family Medicine

## 2015-08-30 NOTE — Telephone Encounter (Signed)
Last office visit 08/24/2015.  Refill?

## 2015-08-30 NOTE — Telephone Encounter (Signed)
Left a message for patient to call back. 

## 2015-08-31 NOTE — Telephone Encounter (Signed)
Left a message for patient to call back. 

## 2015-09-01 ENCOUNTER — Ambulatory Visit (INDEPENDENT_AMBULATORY_CARE_PROVIDER_SITE_OTHER): Payer: BC Managed Care – PPO | Admitting: *Deleted

## 2015-09-01 DIAGNOSIS — Z5181 Encounter for therapeutic drug level monitoring: Secondary | ICD-10-CM | POA: Diagnosis not present

## 2015-09-01 LAB — POCT INR: INR: 2.9

## 2015-09-01 NOTE — Progress Notes (Signed)
Pre visit review using our clinic review tool, if applicable. No additional management support is needed unless otherwise documented below in the visit note. 

## 2015-09-06 ENCOUNTER — Other Ambulatory Visit: Payer: Self-pay | Admitting: *Deleted

## 2015-09-06 NOTE — Telephone Encounter (Signed)
Left a message for patient to call back. 

## 2015-09-07 ENCOUNTER — Encounter: Payer: Self-pay | Admitting: *Deleted

## 2015-09-07 NOTE — Telephone Encounter (Signed)
Mailed a letter to patient with recommendations.

## 2015-09-08 ENCOUNTER — Ambulatory Visit (INDEPENDENT_AMBULATORY_CARE_PROVIDER_SITE_OTHER): Payer: BC Managed Care – PPO | Admitting: Internal Medicine

## 2015-09-08 ENCOUNTER — Encounter: Payer: Self-pay | Admitting: Internal Medicine

## 2015-09-08 ENCOUNTER — Ambulatory Visit (INDEPENDENT_AMBULATORY_CARE_PROVIDER_SITE_OTHER): Payer: BC Managed Care – PPO | Admitting: *Deleted

## 2015-09-08 VITALS — BP 116/84 | HR 102 | Temp 97.9°F | Wt 152.0 lb

## 2015-09-08 DIAGNOSIS — I2699 Other pulmonary embolism without acute cor pulmonale: Secondary | ICD-10-CM | POA: Diagnosis not present

## 2015-09-08 DIAGNOSIS — Z5181 Encounter for therapeutic drug level monitoring: Secondary | ICD-10-CM | POA: Diagnosis not present

## 2015-09-08 DIAGNOSIS — H1131 Conjunctival hemorrhage, right eye: Secondary | ICD-10-CM | POA: Diagnosis not present

## 2015-09-08 LAB — POCT INR: INR: 3.2

## 2015-09-08 MED ORDER — DEXLANSOPRAZOLE 60 MG PO CPDR: 60.0000 mg | DELAYED_RELEASE_CAPSULE | Freq: Every day | ORAL | Status: DC

## 2015-09-08 MED ORDER — WARFARIN SODIUM 5 MG PO TABS: ORAL_TABLET | ORAL | Status: DC

## 2015-09-08 NOTE — Progress Notes (Signed)
Pre visit review using our clinic review tool, if applicable. No additional management support is needed unless otherwise documented below in the visit note. 

## 2015-09-08 NOTE — Patient Instructions (Signed)
Subconjunctival Hemorrhage °A subconjunctival hemorrhage is a bright red patch covering a portion of the white of the eye. The white part of the eye is called the sclera, and it is covered by a thin membrane called the conjunctiva. This membrane is clear, except for tiny blood vessels that you can see with the naked eye. When your eye is irritated or inflamed and becomes red, it is because the vessels in the conjunctiva are swollen. °Sometimes, a blood vessel in the conjunctiva can break and bleed. When this occurs, the blood builds up between the conjunctiva and the sclera, and spreads out to create a red area. The red spot may be very small at first. It may then spread to cover a larger part of the surface of the eye, or even all of the visible white part of the eye. °In almost all cases, the blood will go away and the eye will become white again. Before completely dissolving, however, the red area may spread. It may also become brownish-yellow in color before going away. If a lot of blood collects under the conjunctiva, it may look like a bulge on the surface of the eye. This looks scary, but it will also eventually flatten out and go away. Subconjunctival hemorrhages do not cause pain, but if swollen, may cause a feeling of irritation. There is no effect on vision.  °CAUSES  °· The most common cause is mild trauma (rubbing the eye, irritation). °· Subconjunctival hemorrhages can happen because of coughing or straining (lifting heavy objects), vomiting, or sneezing. °· In some cases, your doctor may want to check your blood pressure. High blood pressure can also cause a subconjunctival hemorrhage. °· Severe trauma or blunt injuries. °· Diseases that affect blood clotting (hemophilia, leukemia). °· Abnormalities of blood vessels behind the eye (carotid cavernous sinus fistula). °· Tumors behind the eye. °· Certain drugs (aspirin, Coumadin, heparin). °· Recent eye surgery. °HOME CARE INSTRUCTIONS  °· Do not worry  about the appearance of your eye. You may continue your usual activities. °· Often, follow-up is not necessary. °SEEK MEDICAL CARE IF:  °· Your eye becomes painful. °· The bleeding does not disappear within 3 weeks. °· Bleeding occurs elsewhere, for example, under the skin, in the mouth, or in the other eye. °· You have recurring subconjunctival hemorrhages. °SEEK IMMEDIATE MEDICAL CARE IF:  °· Your vision changes or you have difficulty seeing. °· You develop a severe headache, persistent vomiting, confusion, or abnormal drowsiness (lethargy). °· Your eye seems to bulge or protrude from the eye socket. °· You notice the sudden appearance of bruises or have spontaneous bleeding elsewhere on your body. °Document Released: 12/10/2005 Document Revised: 04/26/2014 Document Reviewed: 11/07/2009 °ExitCare® Patient Information ©2015 [employer]. This information is not intended to replace advice given to you by your health care provider. Make sure you discuss any questions you have with your health care provider. ° °

## 2015-09-08 NOTE — Progress Notes (Signed)
Subjective:    Patient ID: [name], female    DOB: 12-19-1976, 39 y.o.   MRN: [id number]  HPI  Pt presents to the clinic today with c/o bleeding in her right eye. She noticed this last night. She was looking at the computer when she noticed the pain in her eye. She denies blurred vision. She has not done anything strenuous over the last few days. She did have this same thing 2 weeks ago. Her INR at that time was 5. She did have her INR checked last week, it was 2.9. She has not noticed any other s/s of bleeding. She has not seen an eyedoctor in a while.  Review of Systems      Past Medical History  Diagnosis Date  . Crohn disease 2000  . Asthma   . Endometriosis   . Dysautonomia     recurrent syncope s/p ILR by Dr Doreatha Lew  . Palpitations   . Anxiety   . GERD (gastroesophageal reflux disease)   . Vertigo   . Anginal pain only on 09/22/2014  . H/O hiatal hernia   . Chest pain     a. s/p intermediate risk nuclear stress test on 09/23/14 and normal LHC on 09/24/14   . Chronic nausea   . Migraine   . Pulmonary embolism affecting pregnancy     Current Outpatient Prescriptions  Medication Sig Dispense Refill  . acetaminophen (TYLENOL) 500 MG tablet Take 1,000 mg by mouth every 6 (six) hours as needed for headache.    . albuterol (PROVENTIL HFA;VENTOLIN HFA) 108 (90 BASE) MCG/ACT inhaler Inhale 2 puffs into the lungs every 6 (six) hours as needed for wheezing or shortness of breath. 1 Inhaler 2  . Cetirizine HCl (ZYRTEC ALLERGY PO) Take 1 tablet by mouth daily.     . cyanocobalamin (,VITAMIN B-12,) 1000 MCG/ML injection Inject 1000 mcg IM weekly x 4 then every 2 weeks. Please dispense one mil vials and 1" x 25 G needles and 3 cc syringes. 4 mL 6  . dicyclomine (BENTYL) 20 MG tablet Take one po TID 90 tablet 0  . FeFum-FePoly-FA-B Cmp-C-Biot (INTEGRA PLUS) CAPS Take one po daily 30 capsule 1  . lactobacillus acidophilus (BACID) TABS tablet Take 2 tablets by mouth 3 (three) times  daily.    . predniSONE (DELTASONE) 20 MG tablet Take 30 mg by mouth daily with breakfast.    . predniSONE (DELTASONE) 20 MG tablet TAKE ONE TABLET BY MOUTH EVERY MORNING WITH BREAKFAST 30 tablet 3  . Prenatal Vit-Fe Fumarate-FA (PRENATAL PO) Take 2 tablets by mouth every evening. Prenatal gummy vitamin.    . ranitidine (ZANTAC) 150 MG tablet Take 1 tablet (150 mg total) by mouth every morning. 30 tablet 3  . sertraline (ZOLOFT) 50 MG tablet TAKE 1 TABLET BY MOUTH DAILY 30 tablet 5  . traMADol (ULTRAM) 50 MG tablet Take 1 tablet (50 mg total) by mouth every 6 (six) hours as needed. 50 tablet 1  . zolpidem (AMBIEN) 10 MG tablet TAKE ONE TABLET BY MOUTH AT BEDTIME 30 tablet 0  . dexlansoprazole (DEXILANT) 60 MG capsule Take 1 capsule (60 mg total) by mouth daily. 30 capsule 5  . warfarin (COUMADIN) 5 MG tablet Take as directed by Anti-coagulation Clinic. 40 tablet 3   No current facility-administered medications for this visit.    Allergies  Allergen Reactions  . Compazine [Prochlorperazine Edisylate] Anaphylaxis  . Reglan [Metoclopramide] Anaphylaxis and Other (See Comments)    Other reaction(s): GI Upset (  intolerance) Intensifies her Chron's  . Sulfonamide Derivatives Other (See Comments)    Hematemesis; documented while a young child  . Clarithromycin Other (See Comments)    Reacts with chron's disease  . Sulfamethoxazole Rash and Other (See Comments)    Internal bleeding and kidney infection  . Biaxin [Clarithromycin] Other (See Comments)    Cant take due to Chrons disease  . Codeine Nausea And Vomiting and Other (See Comments)    Dilaudid and demerol OK  . Hydrocodone Nausea And Vomiting  . Macrobid [Nitrofurantoin] Nausea And Vomiting    Other reaction(s): GI Upset (intolerance)  . Phenergan [Promethazine Hcl] Other (See Comments)    "muscle twitches"  . Promethazine Rash and Other (See Comments)    Muscular seizures  . Promethazine Hcl Other (See Comments)    Muscular  seizures  . Sulfa Antibiotics Hives and Other (See Comments)    Hematemesis; documented while a young child    Family History  Problem Relation Age of Onset  . Hypertension Mother   . Hyperlipidemia Mother   . Arthritis Mother   . Diabetes Father   . Coronary artery disease Father   . Heart disease Father 81    MI age 47  . Hyperlipidemia Brother   . Hypertension Brother     Social History   Social History  . Marital Status: Married    Spouse Name: Jonni Sanger  . Number of Children: 2  . Years of Education: college   Occupational History  .  Other    Airline pilot  .  [place] History Main Topics  . Smoking status: Never Smoker   . Smokeless tobacco: Never Used  . Alcohol Use: 0.0 oz/week    0 Standard drinks or equivalent per week     Comment: 09/22/2014 "glass of wine maybe once/month"  . Drug Use: No  . Sexual Activity: Yes   Other Topics Concern  . Not on file   Social History Narrative   Patient lives at home with her husband Jonni Sanger). Patient works full time for [employer].    Education [employer].   Right handed.   Caffeine one cup of coffee daily.     Constitutional: Denies fever, malaise, fatigue, headache or abrupt weight changes.  HEENT: Pt reports eye pain. Denies eye redness, ear pain, ringing in the ears, wax buildup, runny nose, nasal congestion, bloody nose, or sore throat. Respiratory: Denies difficulty breathing, shortness of breath, cough or sputum production.   Cardiovascular: Denies chest pain, chest tightness, palpitations or swelling in the hands or feet.   No other specific complaints in a complete review of systems (except as listed in HPI above).  Objective:   Physical Exam   BP 116/84 mmHg  Pulse 102  Temp(Src) 97.9 F (36.6 C) (Oral)  Wt 152 lb (68.947 kg)  SpO2 99% Wt Readings from Last 3 Encounters:  09/08/15 152 lb (68.947 kg)  08/24/15 148 lb 8 oz (67.359 kg)  08/23/15 148 lb 2 oz  (67.189 kg)    General: Appears her stated age, well developed, well nourished in NAD. Skin: Warm, dry and intact. No rashes, lesions or ulcerations noted. HEENT: Head: normal shape and size; Right Eyes: sclera with hemorrhage noted on medial edge, no icterus, conjunctiva pink, PERRLA and EOMs intact;  Cardiovascular: Normal rate and rhythm. S1,S2 noted.  No murmur, rubs or gallops noted.  Pulmonary/Chest: Normal effort and positive vesicular breath sounds. No respiratory distress. No wheezes, rales or  ronchi noted.    BMET    Component Value Date/Time   NA 139 08/09/2015 0948   K 3.7 08/09/2015 0948   CL 106 08/09/2015 0948   CO2 25 08/09/2015 0948   GLUCOSE 103* 08/09/2015 0948   BUN 16 08/09/2015 0948   CREATININE 0.70 08/09/2015 0948   CALCIUM 8.7 08/09/2015 0948   GFRNONAA >90 11/08/2014 2025   GFRAA >90 11/08/2014 2025    Lipid Panel     Component Value Date/Time   CHOL 186 09/23/2014 0405   TRIG 165* 09/23/2014 0405   HDL 44 09/23/2014 0405   CHOLHDL 4.2 09/23/2014 0405   VLDL 33 09/23/2014 0405   LDLCALC 109* 09/23/2014 0405    CBC    Component Value Date/Time   WBC 14.1* 08/09/2015 0948   RBC 4.09 08/09/2015 0948   HGB 9.8* 08/09/2015 0948   HCT 30.7* 08/09/2015 0948   PLT 395.0 08/09/2015 0948   MCV 75.0* 08/09/2015 0948   MCH 28.4 11/08/2014 2025   MCHC 31.9 08/09/2015 0948   RDW 19.2* 08/09/2015 0948   LYMPHSABS 2.6 08/09/2015 0948   MONOABS 0.5 08/09/2015 0948   EOSABS 0.1 08/09/2015 0948   BASOSABS 0.0 08/09/2015 0948    Hgb A1C Lab Results  Component Value Date   HGBA1C 5.6 09/23/2014        Assessment & Plan:   Subconjunctival Hemorrhage of right eye:  INR 3.1 today- she discussed this with the coumadin clinic RN She can try cool compresses for comfort Avoid straining or anything that may increase pressure in the face/eyes She would like to go see a opthalmoleogist- she will self refer to [facility]  RTC as needed or if  symptoms persist or worsen

## 2015-09-09 ENCOUNTER — Telehealth: Payer: Self-pay | Admitting: *Deleted

## 2015-09-09 MED ORDER — PREDNISONE 10 MG PO TABS: ORAL_TABLET | ORAL | Status: DC

## 2015-09-09 NOTE — Telephone Encounter (Signed)
Spoke with patient and gave her Dr. Doyne Keel recommendations that I mailed to her(called work # [phone]). Rx sent for Predisone.

## 2015-09-12 ENCOUNTER — Other Ambulatory Visit: Payer: Self-pay | Admitting: *Deleted

## 2015-09-12 ENCOUNTER — Other Ambulatory Visit: Payer: BC Managed Care – PPO

## 2015-09-12 DIAGNOSIS — K50919 Crohn's disease, unspecified, with unspecified complications: Secondary | ICD-10-CM

## 2015-09-14 ENCOUNTER — Encounter: Payer: Self-pay | Admitting: Internal Medicine

## 2015-09-14 ENCOUNTER — Ambulatory Visit (INDEPENDENT_AMBULATORY_CARE_PROVIDER_SITE_OTHER): Payer: BC Managed Care – PPO | Admitting: Internal Medicine

## 2015-09-14 VITALS — BP 120/80 | HR 101 | Ht 64.0 in | Wt 153.4 lb

## 2015-09-14 DIAGNOSIS — I2782 Chronic pulmonary embolism: Secondary | ICD-10-CM | POA: Diagnosis not present

## 2015-09-14 DIAGNOSIS — R55 Syncope and collapse: Secondary | ICD-10-CM | POA: Diagnosis not present

## 2015-09-14 DIAGNOSIS — G909 Disorder of the autonomic nervous system, unspecified: Secondary | ICD-10-CM | POA: Diagnosis not present

## 2015-09-14 DIAGNOSIS — O211 Hyperemesis gravidarum with metabolic disturbance: Secondary | ICD-10-CM | POA: Diagnosis not present

## 2015-09-14 DIAGNOSIS — G901 Familial dysautonomia [Riley-Day]: Secondary | ICD-10-CM

## 2015-09-14 NOTE — Assessment & Plan Note (Signed)
She has had no recurrent symptoms. We discussed removal of her ILR. 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

## 2015-09-14 NOTE — Assessment & Plan Note (Signed)
This has resolved with the birth of her 3rd child. She has had a tubal ligation.

## 2015-09-14 NOTE — Progress Notes (Signed)
HPI [name] returns today for followup of autonomic dysfunction. She is a very pleasant 39 year old woman with a history of autonomic dysfunction, who is also known to have hyperemesis gravidarum. She has given birth to a healthy baby. Her N & V has resolved. She has a h/o pulmonary embolism and almost finished up with her coumadin. She notes that her Crohn disease is improved after prednisone. She is pending initiation of humira. She has not passed out. Her ILR is at EOL.   Allergies  Allergen Reactions  . Compazine [Prochlorperazine Edisylate] Anaphylaxis  . Reglan [Metoclopramide] Anaphylaxis and Other (See Comments)    Other reaction(s): GI Upset (intolerance) Intensifies her Chron's  . Sulfonamide Derivatives Other (See Comments)    Hematemesis; documented while a young child  . Clarithromycin Other (See Comments)    Reacts with chron's disease  . Sulfamethoxazole Rash and Other (See Comments)    Internal bleeding and kidney infection  . Biaxin [Clarithromycin] Other (See Comments)    Cant take due to Chrons disease  . Codeine Nausea And Vomiting and Other (See Comments)    Dilaudid and demerol OK  . Hydrocodone Nausea And Vomiting  . Macrobid [Nitrofurantoin] Nausea And Vomiting    Other reaction(s): GI Upset (intolerance)  . Phenergan [Promethazine Hcl] Other (See Comments)    "muscle twitches"  . Promethazine Rash and Other (See Comments)    Muscular seizures  . Promethazine Hcl Other (See Comments)    Muscular seizures  . Sulfa Antibiotics Hives and Other (See Comments)    Hematemesis; documented while a young child     Current Outpatient Prescriptions  Medication Sig Dispense Refill  . acetaminophen (TYLENOL) 500 MG tablet Take 1,000 mg by mouth every 6 (six) hours as needed for headache.    . albuterol (PROVENTIL HFA;VENTOLIN HFA) 108 (90 BASE) MCG/ACT inhaler Inhale 2 puffs into the lungs every 6 (six) hours as needed for wheezing or shortness of breath. 1  Inhaler 2  . Cetirizine HCl (ZYRTEC ALLERGY PO) Take 1 tablet by mouth daily.     . cyanocobalamin (,VITAMIN B-12,) 1000 MCG/ML injection Inject 1,000 mcg into the muscle every 21 ( twenty-one) days.    Marland Kitchen dexlansoprazole (DEXILANT) 60 MG capsule Take 1 capsule (60 mg total) by mouth daily. 30 capsule 5  . dicyclomine (BENTYL) 20 MG tablet Take 20 mg by mouth 3 (three) times daily as needed for spasms.     . FeFum-FePoly-FA-B Cmp-C-Biot (INTEGRA PLUS PO) Take 1 capsule by mouth daily.    Marland Kitchen HUMIRA PEN-CROHNS STARTER 40 MG/0.8ML PNKT Inject 40 mg as directed once a week.    . lactobacillus acidophilus (BACID) TABS tablet Take 2 tablets by mouth 3 (three) times daily.    Marland Kitchen PREDNISONE PO Take 30 mg by mouth daily.    . Prenatal Vit-Fe Fumarate-FA (PRENATAL PO) Take 2 tablets by mouth every evening. Prenatal gummy vitamin.    . ranitidine (ZANTAC) 150 MG tablet Take 1 tablet (150 mg total) by mouth every morning. 30 tablet 3  . sertraline (ZOLOFT) 50 MG tablet TAKE 1 TABLET BY MOUTH DAILY 30 tablet 5  . traMADol (ULTRAM) 50 MG tablet Take 50 mg by mouth every 6 (six) hours as needed for moderate pain or severe pain.    Marland Kitchen warfarin (COUMADIN) 5 MG tablet Take as directed by Anti-coagulation Clinic. 40 tablet 3  . zolpidem (AMBIEN) 10 MG tablet TAKE ONE TABLET BY MOUTH AT BEDTIME 30 tablet 0  No current facility-administered medications for this visit.     Past Medical History  Diagnosis Date  . Crohn disease 2000  . Asthma   . Endometriosis   . Dysautonomia     recurrent syncope s/p ILR by Dr Doreatha Lew  . Palpitations   . Anxiety   . GERD (gastroesophageal reflux disease)   . Vertigo   . Anginal pain only on 09/22/2014  . H/O hiatal hernia   . Chest pain     a. s/p intermediate risk nuclear stress test on 09/23/14 and normal LHC on 09/24/14   . Chronic nausea   . Migraine   . Pulmonary embolism affecting pregnancy     ROS:   All systems reviewed and negative except as noted in the  HPI.   Past Surgical History  Procedure Laterality Date  . Ileocecetomy  2002 X 2  . Loop recorder implant  11/2009    by DR Doreatha Lew  . Knee arthroscopy Bilateral 1988-1990  . Cesarean section  2011    emergency  CS due to placental abruption  . Partial colectomy  2002    partial removed due to crohns  . Appendectomy  ~ 1992  . Tonsillectomy  ~ 1996  . US echocardiography  11/11/2009    EF 55-60%  . Laparoscopy for ectopic pregnancy  11/2012  . Nasal sinus surgery  ~ 2007  . Endoscopic release transverse carpal ligament of hand Right 12/2010  . Colon surgery    . Cystoscopy w/ stone manipulation  X 2  . Cesarean section  2014    "preeclampsia"  . Left heart catheterization with coronary angiogram N/A 09/24/2014    Procedure: LEFT HEART CATHETERIZATION WITH CORONARY ANGIOGRAM;  Surgeon: Leonie Man, MD;  Location[facility] CATH LAB;  Service: Cardiovascular;  Laterality: N/A;     Family History  Problem Relation Age of Onset  . Hypertension Mother   . Hyperlipidemia Mother   . Arthritis Mother   . Diabetes Father   . Coronary artery disease Father   . Heart disease Father 73    MI age 2  . Hyperlipidemia Brother   . Hypertension Brother      Social History   Social History  . Marital Status: Married    Spouse Name: [name]  . Number of Children: 2  . Years of Education: college   Occupational History  .  Other    Airline pilot  .  [place] History Main Topics  . Smoking status: Never Smoker   . Smokeless tobacco: Never Used  . Alcohol Use: 0.0 oz/week    0 Standard drinks or equivalent per week     Comment: 09/22/2014 "glass of wine maybe once/month"  . Drug Use: No  . Sexual Activity: Yes   Other Topics Concern  . Not on file   Social History Narrative   Patient lives at home with her husband [name]). Patient works full time for [employer].    Education [employer].   Right handed.   Caffeine one cup of coffee  daily.     BP 120/80 mmHg  Pulse 101  Ht 5' 4"  (1.626 m)  Wt 153 lb 6.4 oz (69.582 kg)  BMI 26.32 kg/m2  Physical Exam:  Stable appearing 39 year old woman, NAD HEENT: Unremarkable Neck:  6 cm JVD, no thyromegally Back:  No CVA tenderness Lungs:  Clear with no wheezes, rales, or rhonchi.  HEART:  Regular rate rhythm, no murmurs, no rubs,  no clicks Abd:  soft, positive bowel sounds, no organomegally, no rebound, no guarding Ext:  2 plus pulses, no edema, no cyanosis, no clubbing Skin:  No rashes no nodules Neuro:  CN II through XII intact, motor grossly intact   Assess/Plan:

## 2015-09-14 NOTE — Assessment & Plan Note (Signed)
Her coumadin will be stopped next month (6 months). Will follow.

## 2015-09-14 NOTE — Patient Instructions (Signed)
Medication Instructions: - no changes  Labwork: - none  Procedures/Testing: - .none  Follow-Up: - Your physician wants you to follow-up in: July 2017 with Dr. Lovena Le. You will receive a reminder letter in the mail two months in advance. If you don't receive a letter, please call our office to schedule the follow-up appointment.  Any Additional Special Instructions Will Be Listed Below (If Applicable). - none

## 2015-09-14 NOTE — Assessment & Plan Note (Signed)
We discussed the importance of maintaining adequate hydration. She is encouraged to maintain an increased salt diet.

## 2015-09-15 ENCOUNTER — Ambulatory Visit: Payer: BC Managed Care – PPO

## 2015-09-15 ENCOUNTER — Telehealth: Payer: Self-pay | Admitting: Family Medicine

## 2015-09-15 LAB — QUANTIFERON TB GOLD ASSAY (BLOOD)
Interferon Gamma Release Assay: NEGATIVE
Mitogen value: 0.9 IU/mL
QUANTIFERON NIL VALUE: 0.02 [IU]/mL
Quantiferon Tb Ag Minus Nil Value: 0 IU/mL
TB Ag value: 0.02 IU/mL

## 2015-09-15 NOTE — Telephone Encounter (Signed)
Called patient to reschedule Coumadin clinic appointment.  GI started prednisone 9/16 so INR may be elevated.  Suggested lab appointment asap.  Patient will come in at 4:30 pm tomorrow.

## 2015-09-15 NOTE — Telephone Encounter (Signed)
Patient called to cancel her coumadin appointment.  Her daughter is in ICU with pneumonia and asthma.  Patient would like to come in tomorrow afternoon to have her coumadin checked.  I let her know Barnett Applebaum wouldn't be in tomorrow and she wanted to know if someone else could check it.

## 2015-09-16 ENCOUNTER — Other Ambulatory Visit: Payer: BC Managed Care – PPO

## 2015-09-19 ENCOUNTER — Ambulatory Visit (INDEPENDENT_AMBULATORY_CARE_PROVIDER_SITE_OTHER): Payer: BC Managed Care – PPO | Admitting: *Deleted

## 2015-09-19 DIAGNOSIS — Z5181 Encounter for therapeutic drug level monitoring: Secondary | ICD-10-CM

## 2015-09-19 LAB — POCT INR: INR: 3.3

## 2015-09-19 NOTE — Progress Notes (Signed)
Pre visit review using our clinic review tool, if applicable. No additional management support is needed unless otherwise documented below in the visit note. 

## 2015-09-20 ENCOUNTER — Encounter: Payer: Self-pay | Admitting: Physician Assistant

## 2015-09-20 ENCOUNTER — Ambulatory Visit (INDEPENDENT_AMBULATORY_CARE_PROVIDER_SITE_OTHER): Payer: BC Managed Care – PPO | Admitting: Physician Assistant

## 2015-09-20 ENCOUNTER — Other Ambulatory Visit: Payer: BC Managed Care – PPO

## 2015-09-20 VITALS — BP 110/70 | HR 110 | Ht 64.0 in | Wt 152.0 lb

## 2015-09-20 DIAGNOSIS — K50819 Crohn's disease of both small and large intestine with unspecified complications: Secondary | ICD-10-CM | POA: Diagnosis not present

## 2015-09-20 MED ORDER — PREDNISONE 20 MG PO TABS: ORAL_TABLET | ORAL | Status: DC

## 2015-09-20 NOTE — Progress Notes (Signed)
Patient ID: [name], female   DOB: 1976-01-14, 39 y.o.   MRN: [id number]   Subjective:    Patient ID: [name], female    DOB: 10-Sep-1976, 39 y.o.   MRN: [id number]  [name] is a 39 year old white female who had just established with our practice within the past month or so. She was seen by Dr. Delfin Humphrey once and will be status with Dr. Havery Humphrey. She has history of Crohn's ileocolitis diagnosed in 2002. She is status post resection of the terminal ileum for obstructing disease. She also has history of fistulous disease in the past. Several years ago she had initiated Remicade but did not follow through with induction. She has been on 6-MP over the past several years interrupted 4 pregnancies. She now has 3 children at home all under the age of 3 and has a 84 week old. 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 She states her disease generally flares after delivery of her children and did again this summer. She started herself on prednisone and was then seen in our office on 08/23/2015. At that time she was on 30 mg per day and still having significant lower abdominal cramping pain and very frequent bowel movements she says at her very worst up to 25-30 times a day. She says some of the stools would be very small and contain blood. We increased her prednisone to 40 mg by mouth daily and decision made has been made to proceed with initiation of Humira. She has had hepatitis serologies which are negative, QuantiFERON Gold was initially indeterminant then repeated and negative. He was to have stool for C. difficile done but diarrhea stopped briefly and she never turned this back in. She comes in today for follow-up has been tapering her prednisone and is currently down to 20 mg by mouth per day. She says overall she feels better but someday still having as many as 10 bowel movements per day again sometimes these are  small urgent squirts of stool with intermittent blood. She's not seeing blood with every bowel movement and says someday she doesn't have a bowel movement at all. He continues to have intermittent bloating and gas. She has been approved for Humira but has not started as yet. She relates that all 3 of her children have been died with diagnosed with pneumonia over the past week and a half. Her oldest child was actually hospitalized and now the other 2 are being treated with pneumonia. She says her husband became ill yesterday and she is starting to have some congestion in her head. She is exhausted, continues to nurse her infant and is working full-time.  Review of Systems Pertinent positive and negative review of systems were noted in the above HPI section.  All other review of systems was otherwise negative.  Outpatient Encounter Prescriptions as of 09/20/2015  Medication Sig  . acetaminophen (TYLENOL) 500 MG tablet Take 1,000 mg by mouth every 6 (six) hours as needed for headache.  . albuterol (PROVENTIL HFA;VENTOLIN HFA) 108 (90 BASE) MCG/ACT inhaler Inhale 2 puffs into the lungs every 6 (six) hours as needed for wheezing or shortness of breath.  . Cetirizine HCl (ZYRTEC ALLERGY PO) Take 1 tablet by mouth daily.   . cyanocobalamin (,VITAMIN B-12,) 1000 MCG/ML injection Inject 1,000 mcg into the muscle every 21 ( twenty-one) days.  Marland Kitchen dexlansoprazole (DEXILANT) 60 MG capsule Take  1 capsule (60 mg total) by mouth daily.  Marland Kitchen dicyclomine (BENTYL) 20 MG tablet Take 20 mg by mouth 3 (three) times daily as needed for spasms.   . FeFum-FePoly-FA-B Cmp-C-Biot (INTEGRA PLUS PO) Take 1 capsule by mouth daily.  Marland Kitchen HUMIRA PEN-CROHNS STARTER 40 MG/0.8ML PNKT Inject 40 mg as directed once a week.  . lactobacillus acidophilus (BACID) TABS tablet Take 2 tablets by mouth 3 (three) times daily.  . predniSONE (DELTASONE) 20 MG tablet Take 20 mg by mouth x 14 days, then 15 mg by mouth x 14 days then 10 mg every morning x  14 days.  . Prenatal Vit-Fe Fumarate-FA (PRENATAL PO) Take 2 tablets by mouth every evening. Prenatal gummy vitamin.  . ranitidine (ZANTAC) 150 MG tablet Take 1 tablet (150 mg total) by mouth every morning.  . sertraline (ZOLOFT) 50 MG tablet TAKE 1 TABLET BY MOUTH DAILY  . traMADol (ULTRAM) 50 MG tablet Take 50 mg by mouth every 6 (six) hours as needed for moderate pain or severe pain.  Marland Kitchen warfarin (COUMADIN) 5 MG tablet Take as directed by [facility]agulation Clinic.  Marland Kitchen zolpidem (AMBIEN) 10 MG tablet TAKE ONE TABLET BY MOUTH AT BEDTIME  . [DISCONTINUED] PREDNISONE PO Take 20 mg by mouth daily.    No facility-administered encounter medications on file as of 09/20/2015.   Allergies  Allergen Reactions  . Compazine [Prochlorperazine Edisylate] Anaphylaxis  . Reglan [Metoclopramide] Anaphylaxis and Other (See Comments)    Other reaction(s): GI Upset (intolerance) Intensifies her Chron's  . Sulfonamide Derivatives Other (See Comments)    Hematemesis; documented while a young child  . Clarithromycin Other (See Comments)    Reacts with chron's disease  . Sulfamethoxazole Rash and Other (See Comments)    Internal bleeding and kidney infection  . Biaxin [Clarithromycin] Other (See Comments)    Cant take due to Chrons disease  . Codeine Nausea And Vomiting and Other (See Comments)    Dilaudid and demerol OK  . Hydrocodone Nausea And Vomiting  . Macrobid [Nitrofurantoin] Nausea And Vomiting    Other reaction(s): GI Upset (intolerance)  . Phenergan [Promethazine Hcl] Other (See Comments)    "muscle twitches"  . Promethazine Rash and Other (See Comments)    Muscular seizures  . Promethazine Hcl Other (See Comments)    Muscular seizures  . Sulfa Antibiotics Hives and Other (See Comments)    Hematemesis; documented while a young child   Patient Active Problem List   Diagnosis Date Noted  . Chronic pulmonary embolism 09/14/2015  . Conjunctival hemorrhage of right eye 08/24/2015  . High risk  medication use 07/19/2015  . Long-term (current) use of anticoagulants 07/19/2015  . Depression, major, recurrent, moderate 06/10/2015  . Breast feeding status of mother 06/10/2015  . Severe hyperemesis gravidarum   . History of hyperemesis gravidarum 11/08/2014  . Abnormal nuclear stress test 09/24/2014  . Dysautonomia   . Palpitations   . HLD (hyperlipidemia) 09/23/2014  . Elevated LFTs 10/21/2012  . Anxiety 10/06/2012  . BPPV (benign paroxysmal positional vertigo) 03/15/2012  . Crohn's disease 07/27/2011  . Reactive arthritis due to crohn's flares 07/27/2011  . Asthma, moderate persistent 07/27/2011  . GERD (gastroesophageal reflux disease) 07/27/2011  . Allergic rhinitis 07/27/2011  . Endometriosis 07/27/2011  . Migraine 07/27/2011  . Calcium nephrolithiasis 07/27/2011  . Syncope 11/10/2010   Social History   Social History  . Marital Status: Married    Spouse Name: Jonni Sanger  . Number of Children: 2  . Years of Education: college  Occupational History  .  Other    Airline pilot  .  [place] History Main Topics  . Smoking status: Never Smoker   . Smokeless tobacco: Never Used  . Alcohol Use: 0.0 oz/week    0 Standard drinks or equivalent per week     Comment: 09/22/2014 "glass of wine maybe once/month"  . Drug Use: No  . Sexual Activity: Yes   Other Topics Concern  . Not on file   Social History Narrative   Patient lives at home with her husband Jonni Sanger). Patient works full time for [employer].    Education [employer].   Right handed.   Caffeine one cup of coffee daily.    Ms. Roberge family history includes Arthritis in her mother; Coronary artery disease in her father; Diabetes in her father; Heart disease (age of onset: 48) in her father; Hyperlipidemia in her brother and mother; Hypertension in her brother and mother.      Objective:    Filed Vitals:   09/20/15 1538  BP: 110/70  Pulse: 110    Physical Exam   well-developed white female in no acute distress, pleasant blood pressure 110/70, pulse 90 height 5 foot 4 weight 152. HEENT ;nontraumatic normocephalic EOMI PERRLA sclera anicteric, Cardiovascular; regular rate and rhythm with S1-S2 no murmur or gallop, Pulmonary; clear bilaterally, Abdomen ;soft no focal tenderness no guarding or rebound no palpable mass or hepatosplenomegaly bowel sounds are active, Rectal; exam not done, Extremities ;no clubbing cyanosis or edema skin warm and dry, Neuropsych; mood and affect appropriate       Assessment & Plan:   #1 39 yo female with Crohns ileocolitis with exacerbation after delivery of child 14 weeks ago.  Off 6MP until done nursing  Improving slowly with steroids To start Humira #2 Hx of dysautonomia #3 hx of PE- currently on Coumadin  Plan; Pt will submit stool for cdiff  Continue prednisone 20 mg daily x 2 weeks then continue to decrease by 5 mg q 2 weeks   Advised her not to start Humira since all family members currently ill- would wait a couple weeks to assure she doesn't initiate then develpo pneumonia  She will call us when she has Humira and plans to start Induction  Follow up with Dr [name]  10 /25 .Marland Kitchen Happy to see sooner if needed.   Amy S Esterwood PA-C 09/20/2015   Cc: Jinny Sanders, MD

## 2015-09-20 NOTE — Patient Instructions (Addendum)
Please go to the basement level to our lab for C-Diff PCR stool study.  We sent refills for the Prednisone 20 mg.  Take 20 mg by mouth x 14 days Take 15 mg by mouth x 14 days Take 10 mg by mouth x 14 days.  Take Gas X as needed.  Keep your appointment with Dr. Havery Moros  10-18-2015 at 4:00 Pm

## 2015-09-21 ENCOUNTER — Ambulatory Visit: Payer: BC Managed Care – PPO | Admitting: Gastroenterology

## 2015-09-21 NOTE — Progress Notes (Signed)
Agree with plan. Hopefully she can start Humira as soon as possible, cleared to do so. I would like to see her in follow up to discuss long term plan. May consider use of 6MP again to prevent immunogenicity to Humira.

## 2015-09-28 ENCOUNTER — Telehealth: Payer: Self-pay | Admitting: Family Medicine

## 2015-09-28 NOTE — Telephone Encounter (Signed)
[facility] Patient Name: [name] DOB: Jan 20, 1976 Initial Comment Caller states that she is on coumadin, supposed to get her INR tomorrow, but right eye vessel burst again, saw eye doctor, and has questions Nurse Assessment Nurse: Martyn Ehrich, RN, Felicia Date/Time (Eastern Time): 09/28/2015 4:57:19 PM Confirm and document reason for call. If symptomatic, describe symptoms. ---PT is on coumadin bc 2 PE's during pregnancy. Infant was born 16 wks ago. She is having blood in white of her R eye. For 5 weeks a blood vessel will burst and whole eye looks bloody and it will heal and it will happen again. She spoke with Eye specialist at [facility] and pressure was 21. They could not see the back of eye bc it was all blood. nerves look good. She is supposed to have INR checked tom (being done every Thurs. Has the patient traveled out of the country within the last 30 days? ---No Does the patient have any new or worsening symptoms? ---Yes Will a triage be completed? ---Yes Related visit to physician within the last 2 weeks? ---Sharyne Richters the PT have any chronic conditions? (i.e. diabetes, asthma, etc.) ---Yes List chronic conditions. ---asthma, crohen's d. Did the patient indicate they were pregnant? ---No Guidelines Guideline Title Affirmed Question Affirmed Notes Eye - Red Without Pus [1] Eye pain AND [2] more than mild Final Disposition User Downgraded Outcome Per Physician Martyn Ehrich, RN, Felicia Reason: go to appointment tomorrow Comments Dr. Vertell Novak said eye does not sound related to the coumadin Disagree/Comply: Comply   Call Id: [id number]

## 2015-09-29 ENCOUNTER — Ambulatory Visit (INDEPENDENT_AMBULATORY_CARE_PROVIDER_SITE_OTHER): Payer: BC Managed Care – PPO | Admitting: *Deleted

## 2015-09-29 ENCOUNTER — Telehealth: Payer: Self-pay | Admitting: *Deleted

## 2015-09-29 DIAGNOSIS — I2699 Other pulmonary embolism without acute cor pulmonale: Secondary | ICD-10-CM | POA: Diagnosis not present

## 2015-09-29 DIAGNOSIS — Z5181 Encounter for therapeutic drug level monitoring: Secondary | ICD-10-CM

## 2015-09-29 LAB — POCT INR: INR: 2

## 2015-09-29 NOTE — Telephone Encounter (Signed)
Pt has coumadin clinic 09/29/15 at 4:30.

## 2015-09-29 NOTE — Telephone Encounter (Signed)
Agree with early INR

## 2015-09-29 NOTE — Telephone Encounter (Signed)
Patient was seen in the clinic today for INR check at Coumadin clinic.  INR 2.0.  Patient was seen by eye doctor this morning for subconjunctival hematoma (fifth episode since starting Coumadin).  Per hematology, patient is scheduled to discontinue Coumadin on 10/17.  Patient has expressed desire to stop now due to potentially related eye issues.  She has not taken today's dose.  She has not had recurrence of DVT or PE since starting anticoagulation.  Okay to d/c Coumadin early?  Please advise.

## 2015-09-29 NOTE — Progress Notes (Signed)
Pre visit review using our clinic review tool, if applicable. No additional management support is needed unless otherwise documented below in the visit note. 

## 2015-09-29 NOTE — Telephone Encounter (Signed)
I cannot say that there is NOT ANY  risk of recurrence of DVT, PE if stopping early. Given she is having bleeding (although minor bleeding) this could sway her risk to be greater than the benefit.  The final decision is up to her.

## 2015-09-30 NOTE — Telephone Encounter (Signed)
Patient advised of Dr. Rometta Emery comments.  She states she will d/c Coumadin now.  Discussed s/s of DVT/PE and advised ED visit should any occur.  Patient verbalized understanding.

## 2015-09-30 NOTE — Telephone Encounter (Signed)
Left message for patient to return call.

## 2015-10-06 ENCOUNTER — Other Ambulatory Visit: Payer: Self-pay | Admitting: Family Medicine

## 2015-10-06 NOTE — Telephone Encounter (Signed)
Last office visit 09/08/2015 with Webb Silversmith.  Last refilled zolpidem-08/09/2015 for #30 with no refills.  Fioricet not on current medication list.  Ok to refill?

## 2015-10-07 NOTE — Telephone Encounter (Signed)
Refills called into Megan at [facility].

## 2015-10-14 ENCOUNTER — Encounter: Payer: Self-pay | Admitting: Family Medicine

## 2015-10-14 ENCOUNTER — Ambulatory Visit (INDEPENDENT_AMBULATORY_CARE_PROVIDER_SITE_OTHER): Payer: BC Managed Care – PPO | Admitting: Family Medicine

## 2015-10-14 ENCOUNTER — Telehealth: Payer: Self-pay | Admitting: Family Medicine

## 2015-10-14 ENCOUNTER — Ambulatory Visit (INDEPENDENT_AMBULATORY_CARE_PROVIDER_SITE_OTHER)
Admission: RE | Admit: 2015-10-14 | Discharge: 2015-10-14 | Disposition: A | Discharge status: Home / Self Care | Payer: BC Managed Care – PPO | Source: Ambulatory Visit | Attending: Family Medicine | Admitting: Family Medicine

## 2015-10-14 VITALS — BP 129/91 | HR 94 | Temp 98.4°F | Ht 64.8 in | Wt 157.0 lb

## 2015-10-14 DIAGNOSIS — R1011 Right upper quadrant pain: Secondary | ICD-10-CM | POA: Diagnosis not present

## 2015-10-14 DIAGNOSIS — R350 Frequency of micturition: Secondary | ICD-10-CM

## 2015-10-14 DIAGNOSIS — R109 Unspecified abdominal pain: Secondary | ICD-10-CM | POA: Insufficient documentation

## 2015-10-14 LAB — POCT URINALYSIS DIP (MANUAL ENTRY)
Bilirubin, UA: NEGATIVE
Glucose, UA: NEGATIVE
Ketones, POC UA: NEGATIVE
Leukocytes, UA: NEGATIVE
NITRITE UA: NEGATIVE
PH UA: 6
Protein Ur, POC: NEGATIVE
RBC UA: NEGATIVE
UROBILINOGEN UA: 0.2

## 2015-10-14 MED ORDER — CYCLOBENZAPRINE HCL 10 MG PO TABS
10.0000 mg | ORAL_TABLET | Freq: Every evening | ORAL | Status: DC | PRN
Start: 2015-10-14 — End: 2016-04-17

## 2015-10-14 NOTE — Telephone Encounter (Signed)
Pt called stating she is having right sided back pain and lower abdominal pain. 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 She asked that I send a message to Dr. Diona Browner and her medical assistant to see if she can get a rx as she has a hx of kidney infections. The best number to contact her back at is at work [phone].

## 2015-10-14 NOTE — Progress Notes (Signed)
Pre visit review using our clinic review tool, if applicable. No additional management support is needed unless otherwise documented below in the visit note. 

## 2015-10-14 NOTE — Telephone Encounter (Signed)
Appointment scheduled today at 4:15 pm to see Dr. Diona Browner for UTI.

## 2015-10-14 NOTE — Telephone Encounter (Signed)
Needs a urine sample prior to prescribing antibiotics. Give her info on Sat clinic.

## 2015-10-14 NOTE — Assessment & Plan Note (Signed)
Neg UA, no sign of infection. KUB negative for calcium stones. Most likely MSK strain, treat with muscle relaxant, tramadol for pain.  No blood seen in urine today.. ? From different source.

## 2015-10-14 NOTE — Progress Notes (Signed)
Subjective:    Patient ID: [name], female    DOB: 1976-09-09, 39 y.o.   MRN: [id number]  Flank Pain This is a new problem. The current episode started in the past 7 days. The problem occurs constantly. The problem has been rapidly worsening since onset. Quality: no burning. The pain does not radiate. The pain is moderate. Associated symptoms include abdominal pain. Pertinent negatives include no dysuria. (Pain now in right upper quadrant and in back.  hematuria in last few days.)  Urinary Frequency  This is a new problem. The current episode started in the past 7 days (earlier in week). The problem has been gradually worsening. There has been no fever. She is not sexually active. Associated symptoms include flank pain and frequency. Treatments tried: tramadol. The treatment provided no relief. Her past medical history is significant for kidney stones. There is no history of catheterization, recurrent UTIs, a single kidney, urinary stasis or a urological procedure.   She is drinking a lot of water. No N/V.  She is currently in Crohn's flare.. Planning on trying Humira, but will hold off for now given current issues.   In past she had calcium stones. Last episode years ago.  Social History /Family History/Past Medical History reviewed and updated if needed.   Review of Systems  Constitutional: Positive for fatigue.  HENT: Negative for ear pain.   Eyes: Negative for pain.  Gastrointestinal: Positive for abdominal pain. Negative for blood in stool and abdominal distention.  Genitourinary: Positive for frequency and flank pain. Negative for dysuria.       Objective:   Physical Exam  Constitutional: Vital signs are normal. She appears well-developed and well-nourished. She is cooperative.  Non-toxic appearance. She does not appear ill. No distress.  HENT:  Head: Normocephalic.  Right Ear: Hearing, tympanic membrane, external ear and ear canal normal. Tympanic membrane is not  erythematous, not retracted and not bulging.  Left Ear: Hearing, tympanic membrane, external ear and ear canal normal. Tympanic membrane is not erythematous, not retracted and not bulging.  Nose: No mucosal edema or rhinorrhea. Right sinus exhibits no maxillary sinus tenderness and no frontal sinus tenderness. Left sinus exhibits no maxillary sinus tenderness and no frontal sinus tenderness.  Mouth/Throat: Uvula is midline, oropharynx is clear and moist and mucous membranes are normal.  Eyes: Conjunctivae, EOM and lids are normal. Pupils are equal, round, and reactive to light. Lids are everted and swept, no foreign bodies found.  Neck: Trachea normal and normal range of motion. Neck supple. Carotid bruit is not present. No thyroid mass and no thyromegaly present.  Cardiovascular: Normal rate, regular rhythm, S1 normal, S2 normal, normal heart sounds, intact distal pulses and normal pulses.  Exam reveals no gallop and no friction rub.   No murmur heard. Pulmonary/Chest: Effort normal and breath sounds normal. No tachypnea. No respiratory distress. She has no decreased breath sounds. She has no wheezes. She has no rhonchi. She has no rales.  Abdominal: Soft. Normal appearance and bowel sounds are normal. There is no hepatosplenomegaly. There is tenderness in the right upper quadrant and right lower quadrant. There is CVA tenderness. There is no rigidity, no rebound and no guarding.  Pain in  RLQ she associates with her crohn's disease and current stricture that GI is treating.  Neurological: She is alert.  Skin: Skin is warm, dry and intact. No rash noted.  Psychiatric: Her speech is normal and behavior is normal. Judgment and thought content normal. Her mood  appears not anxious. Cognition and memory are normal. She does not exhibit a depressed mood.          Assessment & Plan:

## 2015-10-14 NOTE — Patient Instructions (Addendum)
No sign of infection, hold humira for now. Push water.  Start gentle stretching on right side.  Can use muscle relaxant for  muscle spasm as needed.  Tramadol for pain as needed.

## 2015-10-14 NOTE — Telephone Encounter (Signed)
Left message on voicemail that a urine sample is required prior to prescribing antibiotics.  Phone number left for [facility].

## 2015-10-18 ENCOUNTER — Other Ambulatory Visit (INDEPENDENT_AMBULATORY_CARE_PROVIDER_SITE_OTHER): Payer: BC Managed Care – PPO

## 2015-10-18 ENCOUNTER — Ambulatory Visit (INDEPENDENT_AMBULATORY_CARE_PROVIDER_SITE_OTHER): Payer: BC Managed Care – PPO | Admitting: Gastroenterology

## 2015-10-18 ENCOUNTER — Encounter: Payer: Self-pay | Admitting: Gastroenterology

## 2015-10-18 VITALS — BP 104/76 | HR 84 | Ht 62.7 in | Wt 157.5 lb

## 2015-10-18 DIAGNOSIS — Z79899 Other long term (current) drug therapy: Secondary | ICD-10-CM | POA: Diagnosis not present

## 2015-10-18 DIAGNOSIS — K50919 Crohn's disease, unspecified, with unspecified complications: Secondary | ICD-10-CM

## 2015-10-18 DIAGNOSIS — R109 Unspecified abdominal pain: Secondary | ICD-10-CM

## 2015-10-18 DIAGNOSIS — Z23 Encounter for immunization: Secondary | ICD-10-CM

## 2015-10-18 DIAGNOSIS — R197 Diarrhea, unspecified: Secondary | ICD-10-CM

## 2015-10-18 LAB — CBC WITH DIFFERENTIAL/PLATELET
BASOS ABS: 0.1 10*3/uL (ref 0.0–0.1)
Basophils Relative: 0.5 % (ref 0.0–3.0)
EOS ABS: 0.1 10*3/uL (ref 0.0–0.7)
Eosinophils Relative: 0.5 % (ref 0.0–5.0)
HEMATOCRIT: 33.2 % — AB (ref 36.0–46.0)
HEMOGLOBIN: 10.5 g/dL — AB (ref 12.0–15.0)
LYMPHS PCT: 28.4 % (ref 12.0–46.0)
Lymphs Abs: 3.1 10*3/uL (ref 0.7–4.0)
MCHC: 31.7 g/dL (ref 30.0–36.0)
MCV: 75.3 fl — AB (ref 78.0–100.0)
MONOS PCT: 4.7 % (ref 3.0–12.0)
Monocytes Absolute: 0.5 10*3/uL (ref 0.1–1.0)
NEUTROS ABS: 7.3 10*3/uL (ref 1.4–7.7)
Neutrophils Relative %: 65.9 % (ref 43.0–77.0)
Platelets: 393 10*3/uL (ref 150.0–400.0)
RBC: 4.41 Mil/uL (ref 3.87–5.11)
RDW: 17.7 % — ABNORMAL HIGH (ref 11.5–15.5)
WBC: 11 10*3/uL — AB (ref 4.0–10.5)

## 2015-10-18 MED ORDER — RIFAXIMIN 550 MG PO TABS: 550.0000 mg | ORAL_TABLET | Freq: Three times a day (TID) | ORAL | Status: DC

## 2015-10-18 NOTE — Progress Notes (Signed)
HPI :  39 y/o female with ileocolonic stricturing / perforating Crohn's disease - former patient of Dr. Olevia Perches, new to me, here for follow up.  Ileocolonic Crohns - diagnosed in 2002. She reported she presented with a bowel obstruction with perforation and had surgery upon diagnosis and had 2 surgeries within the first year of diagnosis. She reports a history of abscess formation and fistula development (enteroenteral fistulas?). She was eventually placed on Remicade, only for a short period of time, perhaps 2-3 infusions - she was uncertain if it worked or not, but stopped it for unclear reasons. She had been on 6MP or steroids over time otherwise, she states she has intermittently been on 6MP. She has been on mesalamines in the past which did not help. She has been treated for bacterial overgrowth over time given ileocecectomy. She has had 2 surgeries total for her Crohns.   She reports she delivered a son in June, and after her delivery she reported a flare of symptoms. She has had fairly controlled Crohns during her pregnancy but had severe hyperemesis gravidarum but Crohns was in remission during her pregnancy. She has abdominal pains and diarrhea with blood in her stools when her Crohns flares. She had been on prednisone 51m since delivery until now, although tapering and now at 133mper day. She started Humira with her first injection on Sunday. She tolerated it okay. She has not noticed much difference since starting. She has held 6MP prior to pregnancies. She is still breast feeding. She has a lot of gas and bloating consistent with her prior bacterial overgrowth discomfort.   Her last colonoscopy she thinks was roughly 2014 time frame. CT scan a few months ago showed ? Colonic stricture without obvious active Crohns otherwise.  She otherwise has a history of uveitis and iritis with Cronhns flares. [facility] She reports she also has significant  back pain and arthralgias due to Crohns flares. She takes tramadol for pain, does not NSAIDs.   Her pregnancy was complicated by PE x 2. Was on coumadin but now off all anticoagulants.    Past Medical History  Diagnosis Date  . Crohn disease ([place]  . Asthma   . Endometriosis   . Dysautonomia     recurrent syncope s/p ILR by Dr TeDoreatha Lew. Palpitations   . Anxiety   . GERD (gastroesophageal reflux disease)   . Vertigo   . Anginal pain (HC[place]inonly on 09/22/2014  . H/O hiatal hernia   . Chest pain     a. s/p intermediate risk nuclear stress test on 09/23/14 and normal LHC on 09/24/14   . Chronic nausea   . Migraine   . Pulmonary embolism affecting pregnancy      Past Surgical History  Procedure Laterality Date  . Ileocecetomy  2002 X 2  . Loop recorder implant  11/2009    by DR TeDoreatha Lew. Knee arthroscopy Bilateral 1988-1990  . Cesarean section  2011    emergency  CS due to placental abruption  . Partial colectomy  2002    partial removed due to crohns  . Appendectomy  ~ 1992  . Tonsillectomy  ~ 1996  . UsKoreachocardiography  11/11/2009    EF 55-60%  . Laparoscopy for ectopic pregnancy  11/2012  . Nasal sinus surgery  ~ 2007  . Endoscopic release transverse carpal ligament of hand Right 12/2010  . Colon surgery    . Cystoscopy  w/ stone manipulation  X 2  . Cesarean section  2014    "preeclampsia"  . Left heart catheterization with coronary angiogram N/A 09/24/2014    Procedure: LEFT HEART CATHETERIZATION WITH CORONARY ANGIOGRAM;  Surgeon: Leonie Man, MD;  Location: [facility] CATH LAB;  Service: Cardiovascular;  Laterality: N/A;   Family History  Problem Relation Age of Onset  . Hypertension Mother   . Hyperlipidemia Mother   . Arthritis Mother   . Diabetes Father   . Coronary artery disease Father   . Heart disease Father 74    MI age 32  . Hyperlipidemia Brother   . Hypertension Brother    Social History  Substance Use Topics  . Smoking status: Never Smoker   .  Smokeless tobacco: Never Used  . Alcohol Use: 0.0 oz/week    0 Standard drinks or equivalent per week     Comment: 09/22/2014 "glass of wine maybe once/month"   Current Outpatient Prescriptions  Medication Sig Dispense Refill  . acetaminophen (TYLENOL) 500 MG tablet Take 1,000 mg by mouth every 6 (six) hours as needed for headache.    . albuterol (PROVENTIL HFA;VENTOLIN HFA) 108 (90 BASE) MCG/ACT inhaler Inhale 2 puffs into the lungs every 6 (six) hours as needed for wheezing or shortness of breath. 1 Inhaler 2  . butalbital-acetaminophen-caffeine (FIORICET, ESGIC) 50-325-40 MG tablet TAKE 2 TABLETS EVERY 6 HOURS AS NEEDED 20 tablet 0  . Cetirizine HCl (ZYRTEC ALLERGY PO) Take 1 tablet by mouth daily.     . cyanocobalamin (,VITAMIN B-12,) 1000 MCG/ML injection Inject 1,000 mcg into the muscle every 21 ( twenty-one) days.    . cyclobenzaprine (FLEXERIL) 10 MG tablet Take 1 tablet (10 mg total) by mouth at bedtime as needed for muscle spasms. 15 tablet 0  . dexlansoprazole (DEXILANT) 60 MG capsule Take 1 capsule (60 mg total) by mouth daily. 30 capsule 5  . dicyclomine (BENTYL) 20 MG tablet Take 20 mg by mouth 3 (three) times daily as needed for spasms.     . FeFum-FePoly-FA-B Cmp-C-Biot (INTEGRA PLUS PO) Take 1 capsule by mouth daily.    Marland Kitchen HUMIRA PEN-CROHNS STARTER 40 MG/0.8ML PNKT Inject 40 mg as directed once a week.    . lactobacillus acidophilus (BACID) TABS tablet Take 2 tablets by mouth 3 (three) times daily.    . predniSONE (DELTASONE) 20 MG tablet Take 20 mg by mouth x 14 days, then 15 mg by mouth x 14 days then 10 mg every morning x 14 days. 40 tablet 0  . Prenatal Vit-Fe Fumarate-FA (PRENATAL PO) Take 2 tablets by mouth every evening. Prenatal gummy vitamin.    . ranitidine (ZANTAC) 150 MG tablet Take 1 tablet (150 mg total) by mouth every morning. 30 tablet 3  . sertraline (ZOLOFT) 50 MG tablet TAKE 1 TABLET BY MOUTH DAILY 30 tablet 5  . traMADol (ULTRAM) 50 MG tablet Take 50 mg by  mouth every 6 (six) hours as needed for moderate pain or severe pain.    Marland Kitchen zolpidem (AMBIEN) 10 MG tablet TAKE ONE TABLET AT BEDTIME AS NEEDED FORSLEEP 30 tablet 0   No current facility-administered medications for this visit.   Allergies  Allergen Reactions  . Compazine [Prochlorperazine Edisylate] Anaphylaxis  . Reglan [Metoclopramide] Anaphylaxis and Other (See Comments)    Other reaction(s): GI Upset (intolerance) Intensifies her Chron's  . Sulfonamide Derivatives Other (See Comments)    Hematemesis; documented while a young child  . Clarithromycin Other (See Comments)    Reacts  with chron's disease  . Sulfamethoxazole Rash and Other (See Comments)    Internal bleeding and kidney infection  . Biaxin [Clarithromycin] Other (See Comments)    Cant take due to Chrons disease  . Codeine Nausea And Vomiting and Other (See Comments)    Dilaudid and demerol OK  . Hydrocodone Nausea And Vomiting  . Macrobid [Nitrofurantoin] Nausea And Vomiting    Other reaction(s): GI Upset (intolerance)  . Phenergan [Promethazine Hcl] Other (See Comments)    "muscle twitches"  . Promethazine Rash and Other (See Comments)    Muscular seizures  . Promethazine Hcl Other (See Comments)    Muscular seizures  . Sulfa Antibiotics Hives and Other (See Comments)    Hematemesis; documented while a young child     Review of Systems: All systems reviewed and negative except where noted in HPI.    Dg Abd 1 View  10/14/2015  CLINICAL DATA:  Right-sided flank pain EXAM: ABDOMEN - 1 VIEW COMPARISON:  08/11/2015 FINDINGS: Scattered large and small bowel gas is noted. Fecal material is seen throughout the colon. No abnormal mass or abnormal calcifications are noted. IMPRESSION: No acute abnormality seen. Electronically Signed   By: Inez Catalina M.D.   On: 10/14/2015 17:17   Lab Results  Component Value Date   WBC 14.1* 08/09/2015   HGB 9.8* 08/09/2015   HCT 30.7* 08/09/2015   MCV 75.0* 08/09/2015   PLT  395.0 08/09/2015    Lab Results  Component Value Date   ALT 20 08/09/2015   AST 14 08/09/2015   ALKPHOS 76 08/09/2015   BILITOT 0.2 08/09/2015   Lab Results  Component Value Date   CREATININE 0.70 08/09/2015   BUN 16 08/09/2015   NA 139 08/09/2015   K 3.7 08/09/2015   CL 106 08/09/2015   CO2 25 08/09/2015      Physical Exam: BP 104/76 mmHg  Pulse 84  Ht 5' 2.7" (1.593 m)  Wt 157 lb 8 oz (71.442 kg)  BMI 28.15 kg/m2  Breastfeeding? Yes Constitutional: Pleasant,well-developed, female in no acute distress. HEENT: Normocephalic and atraumatic. Conjunctivae are normal. No scleral icterus. Neck supple.  Cardiovascular: Normal rate, regular rhythm.  Pulmonary/chest: Effort normal and breath sounds normal. No wheezing, rales or rhonchi. Abdominal: Soft, nondistended, mild tenderness to palpation in the right mid and lower abdomen, no rebound or guarding. Bowel sounds active throughout. There are no masses palpable. No hepatomegaly. Extremities: no edema Lymphadenopathy: No cervical adenopathy noted. Neurological: Alert and oriented to person place and time. Skin: Skin is warm and dry. No rashes noted. Psychiatric: Normal mood and affect. Behavior is normal.   ASSESSMENT AND PLAN: 39 y/o female with longstanding ileocolonic stricturing / penetrating Crohn's, complicated by extraintestinal manifestations to include iritis / uveitis and back / joint pains. History as above. Short lived therapy with Remicade remotely, on 6MP intermittently although limited by severe nausea with this, multiple courses of steroids. Most recently she has tapered down prednisone and started Humira. History of 2 bowel resections.  I discussed importance of of maintenance medication in hopes of goal of steroid free remission. I agree with anti-TNF therapy for her course, and given her aggressive phenotype think she warrants combination therapy with thiopurine she was on previously. I discussed the risks /  benefits of anti-TNF therapy and thiopurines, and the increased risk of lymphoma on this regimen. I don't see a prior TPMT value for her so will check this to ensure nromal, and otherwise obtain CBC, CMP, ESR, and  CRP following her starting Humira. I have also asked her to submit a stool sample for C Diff to ensure negative, she has not done this yet. I would like to restage her disease with a colonoscopy in the upcoming months after she has been on Humira a bit to evaluate the colonic stricture and see if this is inflammatory or fibrotic. She had significant symptoms with normal inflammatory markers this past summer, it is possible some of her pain and diarrhea is due to a fibrotic colonic stricture. Otherwise, in the interim given her prominent gas / bloating she may have a component of bacterial overgrowth and recommend a 2 week course of Rifaximin to see if this helps. Regarding her health care maintenance she is up to date on pneumo vacc but is due for a flu shot so will refer for this today. We may also consider a DEXA scan once she is off steroids to screen for osteopenia given her multiple steroid courses over time.   She agreed with the plan. We will otherwise continue Humira loading, try Rifaximin, and plan on starting Imuran in the near future (will try this as she had nausea on 6MP). Will await labs and colonoscopy otherwise. No NSAIDs.  Pine Bush Cellar, MD [facility] Pager [phone]

## 2015-10-18 NOTE — Patient Instructions (Signed)
Please make sure to get a flu shot soon! You may do so through your primary care provider (check with them about when and where) or your local pharmacy.  You have been scheduled for a colonoscopy. Please follow written instructions given to you at your visit today.  Please pick up your prep supplies at the pharmacy within the next 1-3 days. If you use inhalers (even only as needed), please bring them with you on the day of your procedure. Your physician has requested that you go to www.startemmi.com and enter the access code given to you at your visit today. This web site gives a general overview about your procedure. However, you should still follow specific instructions given to you by our office regarding your preparation for the procedure.  Your physician has requested that you go to the basement for lab work before leaving today.

## 2015-10-19 ENCOUNTER — Other Ambulatory Visit: Payer: Self-pay

## 2015-10-19 DIAGNOSIS — K50919 Crohn's disease, unspecified, with unspecified complications: Secondary | ICD-10-CM

## 2015-10-19 LAB — COMPREHENSIVE METABOLIC PANEL
ALBUMIN: 3.9 g/dL (ref 3.5–5.2)
ALK PHOS: 76 U/L (ref 39–117)
ALT: 16 U/L (ref 0–35)
AST: 15 U/L (ref 0–37)
BILIRUBIN TOTAL: 0.2 mg/dL (ref 0.2–1.2)
BUN: 15 mg/dL (ref 6–23)
CALCIUM: 8.6 mg/dL (ref 8.4–10.5)
CO2: 27 mEq/L (ref 19–32)
CREATININE: 0.84 mg/dL (ref 0.40–1.20)
Chloride: 104 mEq/L (ref 96–112)
GFR: 79.9 mL/min (ref >60.00)
Glucose, Bld: 107 mg/dL — ABNORMAL HIGH (ref 70–99)
Potassium: 3.9 mEq/L (ref 3.5–5.1)
Sodium: 139 mEq/L (ref 135–145)
TOTAL PROTEIN: 6.7 g/dL (ref 6.0–8.3)

## 2015-10-19 LAB — CLOSTRIDIUM DIFFICILE BY PCR: CDIFFPCR: NOT DETECTED

## 2015-10-19 LAB — C-REACTIVE PROTEIN: CRP: 0.2 mg/dL — AB (ref 0.5–20.0)

## 2015-10-19 LAB — SEDIMENTATION RATE: Sed Rate: 21 mm/hr (ref 0–22)

## 2015-10-19 MED ORDER — NA SULFATE-K SULFATE-MG SULF 17.5-3.13-1.6 GM/177ML PO SOLN: ORAL | Status: DC

## 2015-10-19 NOTE — Progress Notes (Signed)
Quick Note:  Patient notified of results and recommendations. ______

## 2015-10-20 LAB — QUANTIFERON TB GOLD ASSAY (BLOOD)
Interferon Gamma Release Assay: NEGATIVE
MITOGEN VALUE: 4.3 [IU]/mL
QUANTIFERON TB AG MINUS NIL: 0.01 [IU]/mL
Quantiferon Nil Value: 0.07 IU/mL
TB Ag value: 0.08 IU/mL

## 2015-10-21 ENCOUNTER — Other Ambulatory Visit: Payer: Self-pay | Admitting: *Deleted

## 2015-10-21 DIAGNOSIS — K50119 Crohn's disease of large intestine with unspecified complications: Secondary | ICD-10-CM

## 2015-10-24 ENCOUNTER — Other Ambulatory Visit: Payer: Self-pay | Admitting: Family Medicine

## 2015-10-24 NOTE — Telephone Encounter (Signed)
Last office visit 10/14/2015.  Not on current medication list.  Refill?

## 2015-10-25 ENCOUNTER — Telehealth: Payer: Self-pay | Admitting: Gastroenterology

## 2015-10-25 ENCOUNTER — Telehealth: Payer: Self-pay | Admitting: *Deleted

## 2015-10-25 LAB — THIOPURINE METABOLITES: 6-TGN Metabolite: <36 pmol/8x 10E8

## 2015-10-25 NOTE — Telephone Encounter (Signed)
Spoke with patient. She is not having fevers or vomiting. She has right pelvic vs. RLQ pain, unclear based on her description and not clear if this is from Crohns or something else. Labs from one week ago looked stable with normal inflammatory markers. She is now off prednisone.  It would be good to get her in clinic for assessment to determine if this is pelvic pain vs. Abdominal / Crohns. [name] I am not in clinic tomorrow, are any of the PAs available to see her? I'll touch base with you to see if we can get her in sometime tomorrow. Thanks

## 2015-10-25 NOTE — Telephone Encounter (Signed)
Patient is calling to report a sharp pain in right lower pelvis. States it started over the weekend. No fever. She continues to have diarrhea. Please, advise.

## 2015-10-25 NOTE — Telephone Encounter (Signed)
Alprazolam called into [facility].

## 2015-10-25 NOTE — Telephone Encounter (Signed)
Patient called again. Told her MD will call her after he completes clinic today.

## 2015-10-25 NOTE — Telephone Encounter (Signed)
Already received a message. See previous note.

## 2015-10-26 ENCOUNTER — Encounter: Payer: Self-pay | Admitting: *Deleted

## 2015-10-26 ENCOUNTER — Encounter: Payer: Self-pay | Admitting: Physician Assistant

## 2015-10-26 ENCOUNTER — Ambulatory Visit (INDEPENDENT_AMBULATORY_CARE_PROVIDER_SITE_OTHER): Payer: BC Managed Care – PPO | Admitting: Physician Assistant

## 2015-10-26 VITALS — BP 110/80 | HR 108 | Ht 62.8 in | Wt 156.1 lb

## 2015-10-26 DIAGNOSIS — K50019 Crohn's disease of small intestine with unspecified complications: Secondary | ICD-10-CM

## 2015-10-26 DIAGNOSIS — R1031 Right lower quadrant pain: Secondary | ICD-10-CM

## 2015-10-26 MED ORDER — HYDROMORPHONE HCL 2 MG PO TABS: ORAL_TABLET | ORAL | Status: DC

## 2015-10-26 NOTE — Patient Instructions (Signed)
We have given you a prescription of Dilaudid to take to your pharmacy. We have given you a work note. Full liquid diet.  Home to rest.  No driving with Dilaudid.    You have been scheduled for a CT scan of the abdomen and pelvis at [facility] ([address]---this is in the same building as Press [facility]).   You are scheduled for tomorrow 10-27-2015 at 9:15 am . You should arrive at  9:00 am  to your appointment time for registration. Please follow the written instructions below on the day of your exam:  WARNING: IF YOU ARE ALLERGIC TO IODINE/X-RAY DYE, PLEASE NOTIFY RADIOLOGY IMMEDIATELY AT [phone]! YOU WILL BE GIVEN A 13 HOUR PREMEDICATION PREP.  1) Do not eat or drink anything after 5:00 am  (4 hours prior to your test) 2) You have been given 2 bottles of oral contrast to drink. The solution may taste better if refrigerated, but do NOT add ice or any other liquid to this solution. Shake  well before drinking.    Drink 1 bottle of contrast @ 7:15 am(2 hours prior to your exam)  Drink 1 bottle of contrast @ 8:15 am (1 hour prior to your exam)  You may take any medications as prescribed with a small amount of water except for the following: Metformin, Glucophage, Glucovance, Avandamet, Riomet, Fortamet, Actoplus Met, Janumet, Glumetza or Metaglip. The above medications must be held the day of the exam AND 48 hours after the exam.  The purpose of you drinking the oral contrast is to aid in the visualization of your intestinal tract. The contrast solution may cause some diarrhea. Before your exam is started, you will be given a small amount of fluid to drink. Depending on your individual set of symptoms, you may also receive an intravenous injection of x-ray contrast/dye. Plan on being at [facility] for 30 minutes or long, depending on the type of exam you are having performed.  If you have any questions regarding your exam or if you need to reschedule, you may  call the CT department at [phone] between the hours of 8:00 am and 5:00 pm, Monday-Friday.  ________________________________________________________________________

## 2015-10-26 NOTE — Progress Notes (Signed)
Patient ID: [name], female   DOB: 1976/09/14, 39 y.o.   MRN: [id number]   Subjective:    Patient ID: [name], female    DOB: 18-Sep-1976, 39 y.o.   MRN: [id number]  [place] is a very nice 39 year old white female former patient of Dr. Nichola Sizer who was recently established with Dr. Havery Moros and was just seen in the office last week. She has known ileocolonic Crohn's disease with history of stricturing and perforating disease in the past. She was initially diagnosed in 2002 and is status post ileocecectomy. She delivered her third child in early June this year and says that her disease flared immediately after delivery. She had been on 6-MP for maintenance but stopped this with her pregnancy. She initially put herself on prednisone at 20 mg by mouth daily which was then increased to 40 mg by mouth daily when she was seen in our office in September 2016. She continued to have symptoms of active disease with crampy lower abdominal pain diarrhea and intermittent rectal bleeding. Last imaging was done August 2016 and this did not show any active inflammation but did show a probable fibrotic stricture of colon at the level of the splenic flexure. Patient has gradually been tapering off of prednisone over the past month or so and stopped her steroids within the past week. She has just initiated Humira and has taken her first dose of 160 mg about a week and a half ago. Thus far she has not noticed any improvement with Humira. She comes into the office today complaining of acute severe right lower quadrant pain onset on Friday, 10/21/2015. Says the pain has been sharp and constant since then and nonradiating. She has had some nausea but no vomiting has been able to keep down some by mouth's though feels worse after eating. 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 She has been taking 2 tramadol  at bedtime for pain and says that this isn't helping much. She had severe pain again yesterday, today tried to go to school but hurting too bad to work. He says her diarrhea is about the same has not seen any blood. She has no urinary symptoms though does have history of ureteral lithiasis says these current symptoms are not typical all of which she has had in the past. She has had a bilateral tubal ligation earlier this summer. She is also status post appendectomy done at the time of her ileocecectomy. Labs were done within the past week, C. difficile was negative hemoglobin 10.5  Review of Systems Pertinent positive and negative review of systems were noted in the above HPI section.  All other review of systems was otherwise negative.  Outpatient Encounter Prescriptions as of 10/26/2015  Medication Sig  . acetaminophen (TYLENOL) 500 MG tablet Take 1,000 mg by mouth every 6 (six) hours as needed for headache.  . albuterol (PROVENTIL HFA;VENTOLIN HFA) 108 (90 BASE) MCG/ACT inhaler Inhale 2 puffs into the lungs every 6 (six) hours as needed for wheezing or shortness of breath.  . ALPRAZolam (XANAX) 0.25 MG tablet TAKE 2 TABLETS BY MOUTH EVERY DAY AS NEEDED  . butalbital-acetaminophen-caffeine (FIORICET, ESGIC) 50-325-40 MG tablet TAKE 2 TABLETS EVERY 6 HOURS AS NEEDED  . Cetirizine HCl (ZYRTEC ALLERGY PO) Take 1 tablet by mouth daily.   . cyanocobalamin (,VITAMIN B-12,) 1000 MCG/ML injection  Inject 1,000 mcg into the muscle every 21 ( twenty-one) days.  . cyclobenzaprine (FLEXERIL) 10 MG tablet Take 1 tablet (10 mg total) by mouth at bedtime as needed for muscle spasms.  Marland Kitchen dexlansoprazole (DEXILANT) 60 MG capsule Take 1 capsule (60 mg total) by mouth daily.  Marland Kitchen dicyclomine (BENTYL) 20 MG tablet Take 20 mg by mouth 3 (three) times daily as needed for spasms.   . FeFum-FePoly-FA-B Cmp-C-Biot (INTEGRA PLUS PO) Take 1 capsule by mouth daily.  Marland Kitchen HUMIRA PEN-CROHNS STARTER 40 MG/0.8ML PNKT Inject 40 mg as  directed once a week.  . lactobacillus acidophilus (BACID) TABS tablet Take 2 tablets by mouth 3 (three) times daily.  . Na Sulfate-K Sulfate-Mg Sulf SOLN Take as directed per Colonoscopy.  . predniSONE (DELTASONE) 20 MG tablet Take 20 mg by mouth x 14 days, then 15 mg by mouth x 14 days then 10 mg every morning x 14 days.  . Prenatal Vit-Fe Fumarate-FA (PRENATAL PO) Take 2 tablets by mouth every evening. Prenatal gummy vitamin.  . ranitidine (ZANTAC) 150 MG tablet Take 1 tablet (150 mg total) by mouth every morning.  . rifaximin (XIFAXAN) 550 MG TABS tablet Take 1 tablet (550 mg total) by mouth 3 (three) times daily.  . sertraline (ZOLOFT) 50 MG tablet TAKE 1 TABLET BY MOUTH DAILY  . traMADol (ULTRAM) 50 MG tablet Take 50 mg by mouth every 6 (six) hours as needed for moderate pain or severe pain.  Marland Kitchen zolpidem (AMBIEN) 10 MG tablet TAKE ONE TABLET AT BEDTIME AS NEEDED FORSLEEP  . HYDROmorphone (DILAUDID) 2 MG tablet Take 1/2 to 1 tab every 4-6 hours as needed for pain.   No facility-administered encounter medications on file as of 10/26/2015.   Allergies  Allergen Reactions  . Compazine [Prochlorperazine Edisylate] Anaphylaxis  . Reglan [Metoclopramide] Anaphylaxis and Other (See Comments)    Other reaction(s): GI Upset (intolerance) Intensifies her Chron's  . Sulfonamide Derivatives Other (See Comments)    Hematemesis; documented while a young child  . Clarithromycin Other (See Comments)    Reacts with chron's disease  . Sulfamethoxazole Rash and Other (See Comments)    Internal bleeding and kidney infection  . Biaxin [Clarithromycin] Other (See Comments)    Cant take due to Chrons disease  . Codeine Nausea And Vomiting and Other (See Comments)    Dilaudid and demerol OK  . Hydrocodone Nausea And Vomiting  . Macrobid [Nitrofurantoin] Nausea And Vomiting    Other reaction(s): GI Upset (intolerance)  . Phenergan [Promethazine Hcl] Other (See Comments)    "muscle twitches"  .  Promethazine Rash and Other (See Comments)    Muscular seizures  . Promethazine Hcl Other (See Comments)    Muscular seizures  . Sulfa Antibiotics Hives and Other (See Comments)    Hematemesis; documented while a young child   Patient Active Problem List   Diagnosis Date Noted  . Acute right flank pain 10/14/2015  . Chronic pulmonary embolism ([place]) 09/14/2015  . Conjunctival hemorrhage of right eye 08/24/2015  . High risk medication use 07/19/2015  . Long-term (current) use of anticoagulants 07/19/2015  . Depression, major, recurrent, moderate ([place]) 06/10/2015  . Breast feeding status of mother 06/10/2015  . Severe hyperemesis gravidarum   . History of hyperemesis gravidarum 11/08/2014  . Abnormal nuclear stress test 09/24/2014  . Dysautonomia   . Palpitations   . HLD (hyperlipidemia) 09/23/2014  . Elevated LFTs 10/21/2012  . Anxiety 10/06/2012  . BPPV (benign paroxysmal positional vertigo) 03/15/2012  . Crohn's  disease ([place]) 07/27/2011  . Reactive arthritis due to crohn's flares 07/27/2011  . Asthma, moderate persistent 07/27/2011  . GERD (gastroesophageal reflux disease) 07/27/2011  . Allergic rhinitis 07/27/2011  . Endometriosis 07/27/2011  . Migraine 07/27/2011  . Calcium nephrolithiasis 07/27/2011  . Syncope 11/10/2010   Social History   Social History  . Marital Status: Married    Spouse Name: Jonni Sanger  . Number of Children: 2  . Years of Education: college   Occupational History  .  Other    Airline pilot  .  [place] History Main Topics  . Smoking status: Never Smoker   . Smokeless tobacco: Never Used  . Alcohol Use: 0.0 oz/week    0 Standard drinks or equivalent per week     Comment: 09/22/2014 "glass of wine maybe once/month"  . Drug Use: No  . Sexual Activity: Yes   Other Topics Concern  . Not on file   Social History Narrative   Patient lives at home with her husband Jonni Sanger). Patient works full time for [employer].    Education [employer].   Right handed.   Caffeine one cup of coffee daily.    Ms. Szymanowski family history includes Arthritis in her mother; Coronary artery disease in her father; Diabetes in her father; Heart disease (age of onset: 34) in her father; Hyperlipidemia in her brother and mother; Hypertension in her brother and mother.      Objective:    Filed Vitals:   10/26/15 1507  BP: 110/80  Pulse: 108    Physical Exam  well-developed white female in no acute distress, blood pressure 110/80 pulse 108 height 5 foot 2 weight 156. HEENT ;nontraumatic normocephalic EOMI PERRLA sclera anicteric, Cardiovascula;r regular rate and rhythm with S1 and S2 slightly tachycardia, Pulmonary; clear bilaterally, Abdomen ;soft, bowel sounds are present she is nondistended she is quite tender in the right lower quadrant there is no true guarding or rebound no palpable mass or hepatosplenomegaly she does have midline incisional scar, Rectal ;exam not done, Ext; no clubbing cyanosis or edema skin warm and dry, Neuropsych; mood and affect appropriate       Assessment & Plan:   #1 39 yo female with hx of severe Crohns disease s/p ileocecectomy with hx of stricture and fistulizing disease. Pt had been off all therapy while pregnant, and on Steroids over past 4 months post partum since exacerbation of sxs.  Just tapered off predisone and has had initial induction dose of Humira 10 days ago-now with 5 day hx of severe crampy sharp RLQ pain in addition to nausea, persistent diarrhea- R/O acute inflammatory process,microperforation, abscess ,partial  Obstruction- less likely kidney stone. #2 anemia secondary to above #3 nursing mother #4 hx dysautonomia #5 hx PE  Plan; Home to rest, out of work over next week Full liquid diet  antiemetic as needed-Zofran  Tramadol ineffective and allergic to codeine, has used Dilaudid in past- will give Dilaudid 2 mg- start with half tablet q 4-6 hours prn, no driving    Schedule Ct abd/pelvis in am tomorrow with call report  Discussed potential need for hospitalization with pt, and she is agreeable if needed No steroids until Ct reviewed.    Mariyah Upshaw S Tabbetha Kutscher PA-C 10/26/2015   Cc: Jinny Sanders, MD

## 2015-10-26 NOTE — Telephone Encounter (Signed)
error 

## 2015-10-26 NOTE — Telephone Encounter (Signed)
Patient was given an appointment for later today

## 2015-10-26 NOTE — Telephone Encounter (Signed)
Dr. Havery Moros, I have no openings for extenders until late next week. Please, advise.

## 2015-10-27 ENCOUNTER — Ambulatory Visit (INDEPENDENT_AMBULATORY_CARE_PROVIDER_SITE_OTHER)
Admission: RE | Admit: 2015-10-27 | Discharge: 2015-10-27 | Disposition: A | Discharge status: Home / Self Care | Payer: BC Managed Care – PPO | Source: Ambulatory Visit | Attending: Physician Assistant | Admitting: Physician Assistant

## 2015-10-27 ENCOUNTER — Telehealth: Payer: Self-pay

## 2015-10-27 DIAGNOSIS — K50019 Crohn's disease of small intestine with unspecified complications: Secondary | ICD-10-CM | POA: Diagnosis not present

## 2015-10-27 MED ORDER — IOHEXOL 300 MG/ML  SOLN
100.0000 mL | Freq: Once | INTRAMUSCULAR | Status: AC | PRN
  Administered 2015-10-27: 100 mL via INTRAVENOUS

## 2015-10-27 NOTE — Telephone Encounter (Signed)
-----   Message from Alfredia Ferguson, PA-C sent at 10/27/2015  1:06 PM EDT -----   Please call pt and let her know [name] discussed CT with Dr. Havery Moros.Marland Kitchen He wants to avoid steroids , but also wants her to go ahead with next Humira  Injection today  A bit early.... Call next week with update. Also please go ahead and get her scheduled for Colonoscopy as soon as possible  with Dr Havery Moros .

## 2015-10-27 NOTE — Progress Notes (Signed)
Agree with assessment and plan. Will await CT results, rule out complication from Crohns and help clarify etiology given her history of perforating disease in the past.

## 2015-10-27 NOTE — Telephone Encounter (Signed)
Yes we can get it done sooner than that. Please discuss with Amber who can look at my schedule for a cancellation and get her in sooner. Thanks

## 2015-10-27 NOTE — Telephone Encounter (Signed)
Patient is instructed. She will call back next week with an update. She is scheduled for her colonoscopy in January. She really doesn't want to wait that long. It is a financial issue as well as concern about her health. Can she be scheduled at the hospital or is there another option?

## 2015-10-27 NOTE — Telephone Encounter (Signed)
Amber-please advise

## 2015-10-28 NOTE — Telephone Encounter (Signed)
Patient is advised.  

## 2015-10-28 NOTE — Telephone Encounter (Signed)
Called pt and rescheduled her for 11/09/2015. I told her I will reprint her instructions with new times and dates and mail them to her. Patient has questions about Imuran Beth and she states she was told to talk to you about it.

## 2015-10-28 NOTE — Telephone Encounter (Signed)
I don't want her to start it yet as it can have some side effects and confound the picture of her current symptoms. I will see her in clinic to discuss when to start it. Thanks

## 2015-10-28 NOTE — Telephone Encounter (Signed)
Mrs. will schier you talking about Imuran. Did she need to start Imuran? I don't see it on the medication list.

## 2015-10-28 NOTE — Telephone Encounter (Signed)
I will call patient. I have a few appointments on hold for this month

## 2015-11-07 ENCOUNTER — Encounter: Payer: Self-pay | Admitting: Gastroenterology

## 2015-11-09 ENCOUNTER — Ambulatory Visit (AMBULATORY_SURGERY_CENTER): Payer: BC Managed Care – PPO | Admitting: Gastroenterology

## 2015-11-09 ENCOUNTER — Encounter: Payer: Self-pay | Admitting: Gastroenterology

## 2015-11-09 VITALS — BP 119/74 | HR 80 | Temp 98.6°F | Resp 18 | Ht 62.0 in | Wt 156.0 lb

## 2015-11-09 DIAGNOSIS — K5000 Crohn's disease of small intestine without complications: Secondary | ICD-10-CM

## 2015-11-09 MED ORDER — SODIUM CHLORIDE 0.9 % IV SOLN
500.0000 mL | INTRAVENOUS | Status: DC
Start: 2015-11-09 — End: 2015-11-09

## 2015-11-09 MED ORDER — ADALIMUMAB 40 MG/0.8ML ~~LOC~~ AJKT: 40.0000 mg | AUTO-INJECTOR | SUBCUTANEOUS | Status: DC

## 2015-11-09 NOTE — Progress Notes (Signed)
Report to PACU, RN, vss, BBS= Clear.  

## 2015-11-09 NOTE — Op Note (Signed)
[facility]  [address] [place], [address]   COLONOSCOPY PROCEDURE REPORT  PATIENT: [name], [name]  MR#: #[id number] BIRTHDATE: 06/10/1976 , 39  yrs. old GENDER: female ENDOSCOPIST: Yetta Flock, MD REFERRED BY: PROCEDURE DATE:  11/09/2015 PROCEDURE:   Colonoscopy, diagnostic  ASA CLASS:   Class II INDICATIONS:history of Crohns, now on Humira, restaging colonoscopy.  MEDICATIONS: Propofol 300 mg IV  DESCRIPTION OF PROCEDURE:   After the risks benefits and alternatives of the procedure were thoroughly explained, informed consent was obtained.  The digital rectal exam revealed no abnormalities of the rectum.   The [id number]  endoscope was introduced through the anus and advanced to the ileum. No adverse events experienced.   The quality of the prep was adequate The instrument was then slowly withdrawn as the colon was fully examined. Estimated blood loss is zero unless otherwise noted in this procedure report.      COLON FINDINGS: The colon was healthy in appearance without inflammatory changes.  Very mild loss of vascularity noted in the sigmoid colon but no ulcerations or erosions.  The surgical anastomosis was widely patent and appeared healthy with 3 apthous ulcerations noted.  Rutgeerts' score of i1.  The ileum was intubated and appeared healthy without inflammatory changes.  Mild diverticulosis was noted in the left colon.  Retroflexed views revealed internal hemorrhoids. The time to cecum = 3.9 Withdrawal time = 12.3   The scope was withdrawn and the procedure completed. COMPLICATIONS: There were no immediate complications.  ENDOSCOPIC IMPRESSION: Overall excellent control of Crohns disease. Rutgeerts' score of i1 at anastomosis Mild diverticulosis of the left colon  RECOMMENDATIONS: Continue Humira Please go to the lab for TPMT enzyme testing Resume medications Resume diet Follow up in the GI clinic for reassessment in 3  months  eSigned:  Yetta Flock, MD 11/09/2015 1:58 PM   cc: the patient   PATIENT NAME:  [name] MR#: #[id number]

## 2015-11-09 NOTE — Patient Instructions (Signed)
YOU HAD AN ENDOSCOPIC PROCEDURE TODAY AT [facility]:   Refer to the procedure report that was given to you for any specific questions about what was found during the examination.  If the procedure report does not answer your questions, please call your gastroenterologist to clarify.  If you requested that your care partner not be given the details of your procedure findings, then the procedure report has been included in a sealed envelope for you to review at your convenience later.  YOU SHOULD EXPECT: Some feelings of bloating in the abdomen. Passage of more gas than usual.  Walking can help get rid of the air that was put into your GI tract during the procedure and reduce the bloating. If you had a lower endoscopy (such as a colonoscopy or flexible sigmoidoscopy) you may notice spotting of blood in your stool or on the toilet paper. If you underwent a bowel prep for your procedure, you may not have a normal bowel movement for a few days.  Please Note:  You might notice some irritation and congestion in your nose or some drainage.  This is from the oxygen used during your procedure.  There is no need for concern and it should clear up in a day or so.  SYMPTOMS TO REPORT IMMEDIATELY:   Following lower endoscopy (colonoscopy or flexible sigmoidoscopy):  Excessive amounts of blood in the stool  Significant tenderness or worsening of abdominal pains  Swelling of the abdomen that is new, acute  Fever of 100F or higher  For urgent or emergent issues, a gastroenterologist can be reached at any hour by calling [phone].   DIET: Your first meal following the procedure should be a small meal and then it is ok to progress to your normal diet. Heavy or fried foods are harder to digest and may make you feel nauseous or bloated.  Likewise, meals heavy in dairy and vegetables can increase bloating.  Drink plenty of fluids but you should avoid alcoholic beverages for 24  hours.  ACTIVITY:  You should plan to take it easy for the rest of today and you should NOT DRIVE or use heavy machinery until tomorrow (because of the sedation medicines used during the test).    FOLLOW UP: Our staff will call the number listed on your records the next business day following your procedure to check on you and address any questions or concerns that you may have regarding the information given to you following your procedure. If we do not reach you, we will leave a message.  However, if you are feeling well and you are not experiencing any problems, there is no need to return our call.  We will assume that you have returned to your regular daily activities without incident.  If any biopsies were taken you will be contacted by phone or by letter within the next 1-3 weeks.  Please call us at [phone] if you have not heard about the biopsies in 3 weeks.    SIGNATURES/CONFIDENTIALITY: You and/or your care partner have signed paperwork which will be entered into your electronic medical record.  These signatures attest to the fact that that the information above on your After Visit Summary has been reviewed and is understood.  Full responsibility of the confidentiality of this discharge information lies with you and/or your care-partner.  Diverticulosis-handout given  Continue Humira  Follow-up with Gi in clinic in 3 months.

## 2015-11-09 NOTE — Progress Notes (Signed)
Pt did not pass gas in recovery, pt abd is soft non-tender, pt is comfortable with no complaints at this time, pt did pass some air while in restroom, advised pt on how to get rid of any additional air, pt verbalizes understanding-adm

## 2015-11-10 ENCOUNTER — Telehealth: Payer: Self-pay | Admitting: Gastroenterology

## 2015-11-10 DIAGNOSIS — K5000 Crohn's disease of small intestine without complications: Secondary | ICD-10-CM

## 2015-11-10 MED ORDER — ADALIMUMAB 40 MG/0.8ML ~~LOC~~ AJKT: 40.0000 mg | AUTO-INJECTOR | SUBCUTANEOUS | Status: DC

## 2015-11-10 NOTE — Telephone Encounter (Signed)
Spoke with patient and she states she only got the starter kit from the mail order but no further medications. Spoke with [phone]  [facility] [place]). Rx given to her and they will ship medication to patient. Spoke with patient and gave her phone number for [facility] for future issues. Added [facility] to pharmacy list.

## 2015-11-10 NOTE — Telephone Encounter (Signed)
°  Follow up Call-  Call back number 11/09/2015  Post procedure Call Back phone  # [phone]   Permission to leave phone message Yes    Patient was called at the number given. No answer. Left a message on voice mail.

## 2015-11-10 NOTE — Telephone Encounter (Signed)
Unable to leave a voice mail with phone number for [facility] because voice mail is full.

## 2015-11-10 NOTE — Telephone Encounter (Signed)
Spoke with patient and gave her the number for [facility] for future problems.

## 2015-11-14 ENCOUNTER — Telehealth: Payer: Self-pay | Admitting: *Deleted

## 2015-11-14 NOTE — Telephone Encounter (Signed)
No recall needed for colonsocopy, she is a young Crohns patient, we will perform endoscopy pending on how she does over time. Thanks

## 2015-11-14 NOTE — Telephone Encounter (Signed)
Dr. Havery Moros,  Would you like a recall colonoscopy for this patient?

## 2015-11-21 NOTE — Telephone Encounter (Signed)
noted 

## 2015-11-23 ENCOUNTER — Other Ambulatory Visit: Payer: Self-pay | Admitting: Family Medicine

## 2015-11-23 NOTE — Telephone Encounter (Signed)
Last office visit 10/14/2015.  Ok to refill?

## 2015-11-24 NOTE — Telephone Encounter (Signed)
Zolpidem and Alprazolam called into [facility].

## 2015-12-01 ENCOUNTER — Telehealth: Payer: Self-pay | Admitting: Gastroenterology

## 2015-12-01 NOTE — Telephone Encounter (Signed)
Rollene Fare can you please contact the patient and refer her to Rheumatology for this. The question is whether her joints pains are related to her Crohns, her medication (Humira), or another process. Her Crohns is in good control based on most recent endoscopy, CT scan, and labs. I would like her seen as soon as possible by Rheumatology. f she cannot be seen in the near future by Rheumatology we can try to see her or she can see her PCM. Thanks

## 2015-12-01 NOTE — Telephone Encounter (Signed)
Patient states her joints are painful and swollen. Knees are swollen as large as her thighs. States Dr. Havery Moros was going to refer her to rheumatology. Please, advise.

## 2015-12-01 NOTE — Telephone Encounter (Signed)
Left a message for Dr. Melissa Noon referral nurse(Kimberly) to call back.[phone])

## 2015-12-02 NOTE — Telephone Encounter (Signed)
Joelene Millin left a message to have records faxed over to [phone]. Faxed all records, demographics, lab, and insurance information.

## 2015-12-07 NOTE — Telephone Encounter (Signed)
Spoke with Joelene Millin at Dr. Melissa Noon office and they are waiting on patient to return her call to make the [place].

## 2015-12-13 ENCOUNTER — Telehealth: Payer: Self-pay | Admitting: Gastroenterology

## 2015-12-13 NOTE — Telephone Encounter (Signed)
Spoke with [facility] and obtained PA for Humira ([phone]). Medication will be sent to patient. Left a message for patient with this information.

## 2015-12-21 ENCOUNTER — Encounter: Payer: Self-pay | Admitting: Family Medicine

## 2015-12-21 ENCOUNTER — Ambulatory Visit (INDEPENDENT_AMBULATORY_CARE_PROVIDER_SITE_OTHER): Payer: BC Managed Care – PPO | Admitting: Family Medicine

## 2015-12-21 VITALS — BP 100/76 | HR 103 | Temp 98.5°F | Ht 62.8 in | Wt 151.8 lb

## 2015-12-21 DIAGNOSIS — Z79899 Other long term (current) drug therapy: Secondary | ICD-10-CM

## 2015-12-21 DIAGNOSIS — K5090 Crohn's disease, unspecified, without complications: Secondary | ICD-10-CM | POA: Diagnosis not present

## 2015-12-21 DIAGNOSIS — H6591 Unspecified nonsuppurative otitis media, right ear: Secondary | ICD-10-CM

## 2015-12-21 MED ORDER — HYDROCODONE-HOMATROPINE 5-1.5 MG/5ML PO SYRP: ORAL_SOLUTION | ORAL | Status: DC

## 2015-12-21 MED ORDER — AMOXICILLIN 500 MG PO CAPS: 1000.0000 mg | ORAL_CAPSULE | Freq: Two times a day (BID) | ORAL | Status: DC

## 2015-12-21 NOTE — Progress Notes (Signed)
Pre visit review using our clinic review tool, if applicable. No additional management support is needed unless otherwise documented below in the visit note. 

## 2015-12-21 NOTE — Progress Notes (Signed)
Dr. Frederico Hamman T. Adelfa Lozito, MD, [facility] [place], [address] Phone: [phone] Fax: [phone]  12/21/2015  Patient: [name], MRN: [id number], DOB: May 20, 1976, 39 y.o.  Primary Physician:  Eliezer Lofts, MD   Chief Complaint  Patient presents with  . Cough  . Ear Pain   Subjective:   [name] is a 39 y.o. very pleasant female patient who presents with the following:  Patient with crohn's dz, h/o PE. Now with cough and ear pain.   On humira right now.   She is having right greater than left ear pain. [name] also has some nasal congestion, sinus discomfort and some coughing that is productive of sputum. She is having a difficult time resting at night secondary to cough.   Past Medical History, Surgical History, Social History, Family History, Problem List, Medications, and Allergies have been reviewed and updated if relevant.  Patient Active Problem List   Diagnosis Date Noted  . Acute right flank pain 10/14/2015  . Chronic pulmonary embolism ([place]) 09/14/2015  . Conjunctival hemorrhage of right eye 08/24/2015  . High risk medication use 07/19/2015  . Long-term (current) use of anticoagulants 07/19/2015  . Depression, major, recurrent, moderate ([place]) 06/10/2015  . Breast feeding status of mother 06/10/2015  . Severe hyperemesis gravidarum   . History of hyperemesis gravidarum 11/08/2014  . Abnormal nuclear stress test 09/24/2014  . Dysautonomia   . Palpitations   . HLD (hyperlipidemia) 09/23/2014  . Elevated LFTs 10/21/2012  . Anxiety 10/06/2012  . BPPV (benign paroxysmal positional vertigo) 03/15/2012  . Crohn's disease ([place]) 07/27/2011  . Reactive arthritis due to crohn's flares 07/27/2011  . Asthma, moderate persistent 07/27/2011  . GERD (gastroesophageal reflux disease) 07/27/2011  . Allergic rhinitis 07/27/2011  . Endometriosis 07/27/2011  . Migraine 07/27/2011  . Calcium nephrolithiasis  07/27/2011  . Syncope 11/10/2010    Past Medical History  Diagnosis Date  . Crohn disease ([place]) 2000  . Asthma   . Endometriosis   . Dysautonomia     recurrent syncope s/p ILR by Dr Doreatha Lew  . Palpitations   . Anxiety   . GERD (gastroesophageal reflux disease)   . Vertigo   . Anginal pain ([place]) only on 09/22/2014  . H/O hiatal hernia   . Chest pain     a. s/p intermediate risk nuclear stress test on 09/23/14 and normal LHC on 09/24/14   . Chronic nausea   . Migraine   . Pulmonary embolism affecting pregnancy     Past Surgical History  Procedure Laterality Date  . Ileocecetomy  2002 X 2  . Loop recorder implant  11/2009    by DR Doreatha Lew  . Knee arthroscopy Bilateral 1988-1990  . Cesarean section  2011    emergency  CS due to placental abruption  . Partial colectomy  2002    partial removed due to crohns  . Appendectomy  ~ 1992  . Tonsillectomy  ~ 1996  . US echocardiography  11/11/2009    EF 55-60%  . Laparoscopy for ectopic pregnancy  11/2012  . Nasal sinus surgery  ~ 2007  . Endoscopic release transverse carpal ligament of hand Right 12/2010  . Colon surgery    . Cystoscopy w/ stone manipulation  X 2  . Cesarean section  2014    "preeclampsia"  . Left heart catheterization with coronary angiogram N/A 09/24/2014    Procedure: LEFT HEART CATHETERIZATION WITH CORONARY ANGIOGRAM;  Surgeon: Shanon Brow  Loren Racer, MD;  Location: [facility] CATH LAB;  Service: Cardiovascular;  Laterality: N/A;    Social History   Social History  . Marital Status: Married    Spouse Name: Jonni Sanger  . Number of Children: 2  . Years of Education: college   Occupational History  .  Other    Airline pilot  .  [place] History Main Topics  . Smoking status: Never Smoker   . Smokeless tobacco: Never Used  . Alcohol Use: 0.0 oz/week    0 Standard drinks or equivalent per week     Comment: 09/22/2014 "glass of wine maybe once/month"  . Drug Use: No  . Sexual Activity: Yes    Other Topics Concern  . Not on file   Social History Narrative   Patient lives at home with her husband Jonni Sanger). Patient works full time for [employer].    Education [employer].   Right handed.   Caffeine one cup of coffee daily.    Family History  Problem Relation Age of Onset  . Hypertension Mother   . Hyperlipidemia Mother   . Arthritis Mother   . Diabetes Father   . Coronary artery disease Father   . Heart disease Father 18    MI age 59  . Hyperlipidemia Brother   . Hypertension Brother   . Colon cancer Paternal Grandmother     Allergies  Allergen Reactions  . Compazine [Prochlorperazine Edisylate] Anaphylaxis  . Reglan [Metoclopramide] Anaphylaxis and Other (See Comments)    Other reaction(s): GI Upset (intolerance) Intensifies her Chron's  . Sulfamethoxazole Rash and Other (See Comments)    Internal bleeding and kidney infection  . Biaxin [Clarithromycin] Other (See Comments)    Cant take due to Chrons disease  . Codeine Nausea And Vomiting and Other (See Comments)    Dilaudid and demerol OK  . Hydrocodone Nausea And Vomiting  . Macrobid [Nitrofurantoin] Nausea And Vomiting    Other reaction(s): GI Upset (intolerance)  . Promethazine Rash and Other (See Comments)    Muscular seizures  . Sulfa Antibiotics Hives and Other (See Comments)    Hematemesis; documented while a young child    Medication list reviewed and updated in full in [place].  ROS: GEN: Acute illness details above GI: Tolerating PO intake GU: maintaining adequate hydration and urination Pulm: No SOB Interactive and getting along well at home.  Otherwise, ROS is as per the HPI.   Objective:   BP 100/76 mmHg  Pulse 103  Temp(Src) 98.5 F (36.9 C) (Oral)  Ht 5' 2.8" (1.595 m)  Wt 151 lb 12 oz (68.833 kg)  BMI 27.06 kg/m2  SpO2 99%   Gen: WDWN, NAD; A & O x3, cooperative. Pleasant.Globally Non-toxic HEENT: Normocephalic and atraumatic. Throat clear, w/o  exudate, R TM bulging TM yellowish coloration, L TM - good landmarks, No fluid present. rhinnorhea.  MMM Frontal sinuses: NT Max sinuses: NT NECK: Anterior cervical  LAD is absent CV: RRR, No M/G/R, cap refill <2 sec PULM: Breathing comfortably in no respiratory distress. no wheezing, crackles, rhonchi EXT: No c/c/e PSYCH: Friendly, good eye contact MSK: Nml gait     Laboratory and Imaging Data:  Assessment and Plan:   Right otitis media with effusion  Crohn's disease without complication, unspecified gastrointestinal tract location [facility])  High risk medication use  High risk patient with early R OM  + supportive care  Follow-up: No Follow-up on file.  New Prescriptions   AMOXICILLIN (  AMOXIL) 500 MG CAPSULE    Take 2 capsules (1,000 mg total) by mouth 2 (two) times daily.   HYDROCODONE-HOMATROPINE (HYCODAN) 5-1.5 MG/5ML SYRUP    1 tsp po at night before bed prn cough   Modified Medications   No medications on file   No orders of the defined types were placed in this encounter.    Signed,  Maud Deed. Terron Merfeld, MD   Patient's Medications  New Prescriptions   AMOXICILLIN (AMOXIL) 500 MG CAPSULE    Take 2 capsules (1,000 mg total) by mouth 2 (two) times daily.   HYDROCODONE-HOMATROPINE (HYCODAN) 5-1.5 MG/5ML SYRUP    1 tsp po at night before bed prn cough  Previous Medications   ACETAMINOPHEN (TYLENOL) 500 MG TABLET    Take 1,000 mg by mouth every 6 (six) hours as needed for headache.   ADALIMUMAB (HUMIRA PEN) 40 MG/0.8ML PNKT    Inject 40 mg into the skin every 14 (fourteen) days.   ALBUTEROL (PROVENTIL HFA;VENTOLIN HFA) 108 (90 BASE) MCG/ACT INHALER    Inhale 2 puffs into the lungs every 6 (six) hours as needed for wheezing or shortness of breath.   ALPRAZOLAM (XANAX) 0.25 MG TABLET    TAKE 2 TABLETS BY MOUTH EVERY DAY AS NEEDED   BUTALBITAL-ACETAMINOPHEN-CAFFEINE (FIORICET, ESGIC) 50-325-40 MG TABLET    TAKE 2 TABLETS EVERY 6 HOURS AS NEEDED   CETIRIZINE HCL (ZYRTEC  ALLERGY PO)    Take 1 tablet by mouth daily.    CYANOCOBALAMIN (,VITAMIN B-12,) 1000 MCG/ML INJECTION    Inject 1,000 mcg into the muscle every 21 ( twenty-one) days.   CYCLOBENZAPRINE (FLEXERIL) 10 MG TABLET    Take 1 tablet (10 mg total) by mouth at bedtime as needed for muscle spasms.   DEXLANSOPRAZOLE (DEXILANT) 60 MG CAPSULE    Take 1 capsule (60 mg total) by mouth daily.   DICYCLOMINE (BENTYL) 20 MG TABLET    Take 20 mg by mouth 3 (three) times daily as needed for spasms.    FEFUM-FEPOLY-FA-B CMP-C-BIOT (INTEGRA PLUS PO)    Take 1 capsule by mouth daily.   HYDROMORPHONE (DILAUDID) 2 MG TABLET    Take 1/2 to 1 tab every 4-6 hours as needed for pain.   LACTOBACILLUS ACIDOPHILUS (BACID) TABS TABLET    Take 2 tablets by mouth 3 (three) times daily.   PREDNISONE (DELTASONE) 20 MG TABLET    Take 20 mg by mouth x 14 days, then 15 mg by mouth x 14 days then 10 mg every morning x 14 days.   PRENATAL VIT-FE FUMARATE-FA (PRENATAL PO)    Take 2 tablets by mouth every evening. Prenatal gummy vitamin.   RANITIDINE (ZANTAC) 150 MG TABLET    Take 1 tablet (150 mg total) by mouth every morning.   RIFAXIMIN (XIFAXAN) 550 MG TABS TABLET    Take 1 tablet (550 mg total) by mouth 3 (three) times daily.   SERTRALINE (ZOLOFT) 50 MG TABLET    TAKE 1 TABLET BY MOUTH DAILY   TRAMADOL (ULTRAM) 50 MG TABLET    Take 50 mg by mouth every 6 (six) hours as needed for moderate pain or severe pain.   ZOLPIDEM (AMBIEN) 10 MG TABLET    TAKE 1 TABLET BY MOUTH EVERY NIGHT AT BEDTIME AS NEEDED.  Modified Medications   No medications on file  Discontinued Medications   No medications on file

## 2015-12-23 ENCOUNTER — Other Ambulatory Visit: Payer: Self-pay | Admitting: Family Medicine

## 2015-12-23 NOTE — Telephone Encounter (Signed)
Last office visit 12/21/2015 with Dr. Lorelei Pont. Last refilled 11/24/2015 for #30 with no refills.  Ok to refill?

## 2015-12-23 NOTE — Telephone Encounter (Signed)
Alprazolam and Zolpidem called into [facility].

## 2015-12-27 ENCOUNTER — Telehealth: Payer: Self-pay

## 2015-12-27 MED ORDER — SERTRALINE HCL 100 MG PO TABS: 50.0000 mg | ORAL_TABLET | Freq: Every day | ORAL | Status: DC

## 2015-12-27 NOTE — Telephone Encounter (Signed)
Verify no SI, no HI.  I will increase sertraline to 100 mg daily.  Have her make 30 min OV appt in 1 months for follow up.

## 2015-12-27 NOTE — Telephone Encounter (Signed)
Pt requesting increase in zoloft;zoloft not controlling the feeling of anxiety, when pt feels nervous gets lightheaded. No depression; No S/I or H/I. Pt will schedule appt but needs appt to see Dr Diona Browner after school; did not see 30 min appt with Dr Diona Browner in next 2 weeks after school hours;Please advise.Katherina Right. Pt request cb.

## 2015-12-28 ENCOUNTER — Ambulatory Visit: Payer: BC Managed Care – PPO | Admitting: Gastroenterology

## 2015-12-28 MED ORDER — SERTRALINE HCL 100 MG PO TABS: 100.0000 mg | ORAL_TABLET | Freq: Every day | ORAL | Status: DC

## 2015-12-28 NOTE — Telephone Encounter (Signed)
Spoke with [place].  She has no SI or HI.  Instructed to increase Zoloft to 100 mg daily.  Appointment scheduled for 01/27/2016 at 4:00 pm to follow up with Dr. Diona Browner.    Last Rx sent in stated to take 1/2 tablet so pharmacy only gave her 77.  New Rx sent in to [facility] with new dosing instructions.

## 2015-12-30 ENCOUNTER — Telehealth: Payer: Self-pay | Admitting: Gastroenterology

## 2015-12-30 NOTE — Telephone Encounter (Signed)
I received a report from the patient's Rheumatologist, Dr. Amil Amen, who is concerned she has Rheumatoid arthritis as well as Crohns, and wants to place her on Remicade. She has a history of very aggressive Crohns disease which is currently well controlled on Humira, although she has significant joint pains. She has previously been on Remicade for a short period of time in the past. I need to discuss her case with him prior to making any changes to her medications.

## 2016-01-02 ENCOUNTER — Encounter: Payer: BC Managed Care – PPO | Admitting: Gastroenterology

## 2016-01-02 NOTE — Telephone Encounter (Signed)
Called and not able to leave a message, will call again. Voicemail is full

## 2016-01-02 NOTE — Telephone Encounter (Signed)
Called and spoke with Rheumatologist, Dr. Amil Amen, about her case. She has seen him for arthralgias. Her Crohns from my perspective based on recent colonoscopy was in excellent control. If she has a lot of GI symptoms I suspect she could have concomittant IBS. Her inflammatory markers were likewise normal. We discussed her Crohns history and he agreed that if it is controlling her Crohns well, we should try to keep her on it if possible. He is not sure if she has joint pains related to fibromyalgia or perhaps from the drug itself. I will call her to discuss this and he will call her as well. May consider a trial of Lyrica for her joint pain and continue Humira. If they don't get any better at all, we could skip a few doses of Humira to see if they get any better. If they do, then we would need to discuss alternative agents for her Crohns.

## 2016-01-04 NOTE — Telephone Encounter (Signed)
I spoke with the patient and relayed to her my discussion with Dr. Amil Amen. Since I have spoken with her she has already held her last dose of Humira, about 6 days, and her joint pains are "30%" better from previous. Suspect she could be having arthralgias related to Humira. Recommend we hold the drug another week and will have her call me to touch base and see how she is doing. If her joint pains clearly improve by holding Humira, then this is likely drug effect and may consider other options for her Crohns. If her joint pains persist and do not improve, we will consider resuming Humira and adding something to it (to treat for possible fibromyalgia) or consider changing her regimen. She agreed. I do think Humira is controlling her Crohns well based on her recent evaluation and would like to continue it if at all possible, but if she does not tolerate it will have to switch. She agreed.

## 2016-01-09 ENCOUNTER — Other Ambulatory Visit: Payer: Self-pay

## 2016-01-09 MED ORDER — RANITIDINE HCL 150 MG PO TABS: 150.0000 mg | ORAL_TABLET | Freq: Every morning | ORAL | Status: DC

## 2016-01-17 ENCOUNTER — Other Ambulatory Visit (INDEPENDENT_AMBULATORY_CARE_PROVIDER_SITE_OTHER): Payer: BC Managed Care – PPO

## 2016-01-17 ENCOUNTER — Ambulatory Visit (INDEPENDENT_AMBULATORY_CARE_PROVIDER_SITE_OTHER): Payer: BC Managed Care – PPO | Admitting: Gastroenterology

## 2016-01-17 ENCOUNTER — Encounter: Payer: Self-pay | Admitting: Gastroenterology

## 2016-01-17 VITALS — BP 98/60 | HR 72 | Ht 62.0 in | Wt 149.0 lb

## 2016-01-17 DIAGNOSIS — M25559 Pain in unspecified hip: Secondary | ICD-10-CM

## 2016-01-17 DIAGNOSIS — K50919 Crohn's disease, unspecified, with unspecified complications: Secondary | ICD-10-CM

## 2016-01-17 DIAGNOSIS — M25569 Pain in unspecified knee: Secondary | ICD-10-CM

## 2016-01-17 DIAGNOSIS — K50119 Crohn's disease of large intestine with unspecified complications: Secondary | ICD-10-CM

## 2016-01-17 LAB — COMPREHENSIVE METABOLIC PANEL
ALK PHOS: 99 U/L (ref 39–117)
ALT: 11 U/L (ref 0–35)
AST: 13 U/L (ref 0–37)
Albumin: 4.3 g/dL (ref 3.5–5.2)
BUN: 13 mg/dL (ref 6–23)
CHLORIDE: 104 meq/L (ref 96–112)
CO2: 28 mEq/L (ref 19–32)
Calcium: 8.7 mg/dL (ref 8.4–10.5)
Creatinine, Ser: 0.59 mg/dL (ref 0.40–1.20)
GFR: 119.97 mL/min (ref >60.00)
GLUCOSE: 95 mg/dL (ref 70–99)
POTASSIUM: 3.8 meq/L (ref 3.5–5.1)
SODIUM: 139 meq/L (ref 135–145)
Total Bilirubin: 0.2 mg/dL (ref 0.2–1.2)
Total Protein: 6.9 g/dL (ref 6.0–8.3)

## 2016-01-17 LAB — CBC WITH DIFFERENTIAL/PLATELET
BASOS PCT: 1 % (ref 0.0–3.0)
Basophils Absolute: 0.1 10*3/uL (ref 0.0–0.1)
EOS PCT: 1.7 % (ref 0.0–5.0)
Eosinophils Absolute: 0.1 10*3/uL (ref 0.0–0.7)
HCT: 34.9 % — ABNORMAL LOW (ref 36.0–46.0)
Hemoglobin: 11.2 g/dL — ABNORMAL LOW (ref 12.0–15.0)
LYMPHS ABS: 3.1 10*3/uL (ref 0.7–4.0)
Lymphocytes Relative: 38.6 % (ref 12.0–46.0)
MCHC: 32.2 g/dL (ref 30.0–36.0)
MCV: 77.6 fl — AB (ref 78.0–100.0)
MONO ABS: 0.5 10*3/uL (ref 0.1–1.0)
MONOS PCT: 6.9 % (ref 3.0–12.0)
NEUTROS ABS: 4.1 10*3/uL (ref 1.4–7.7)
NEUTROS PCT: 51.8 % (ref 43.0–77.0)
PLATELETS: 347 10*3/uL (ref 150.0–400.0)
RBC: 4.5 Mil/uL (ref 3.87–5.11)
RDW: 18 % — AB (ref 11.5–15.5)
WBC: 8 10*3/uL (ref 4.0–10.5)

## 2016-01-17 LAB — IBC PANEL
IRON: 28 ug/dL — AB (ref 42–145)
SATURATION RATIOS: 5.6 % — AB (ref 20.0–50.0)
TRANSFERRIN: 360 mg/dL (ref 212.0–360.0)

## 2016-01-17 LAB — SEDIMENTATION RATE: Sed Rate: 15 mm/hr (ref 0–22)

## 2016-01-17 LAB — FERRITIN: Ferritin: 7.3 ng/mL — ABNORMAL LOW (ref 10.0–291.0)

## 2016-01-17 LAB — C-REACTIVE PROTEIN: CRP: 0.1 mg/dL — ABNORMAL LOW (ref 0.5–20.0)

## 2016-01-17 MED ORDER — DEXLANSOPRAZOLE 60 MG PO CPDR: 60.0000 mg | DELAYED_RELEASE_CAPSULE | Freq: Every day | ORAL | Status: DC

## 2016-01-17 MED ORDER — RIFAXIMIN 550 MG PO TABS: 550.0000 mg | ORAL_TABLET | Freq: Three times a day (TID) | ORAL | Status: DC

## 2016-01-17 MED ORDER — RANITIDINE HCL 150 MG PO TABS: 150.0000 mg | ORAL_TABLET | Freq: Every morning | ORAL | Status: DC

## 2016-01-17 NOTE — Progress Notes (Signed)
HPI :  LAST VISIT: 40 y/o female with ileocolonic stricturing / perforating Crohn's disease - former patient of Dr. Olevia Perches, new to me, here for follow up.  Ileocolonic Crohns - diagnosed in 2002. She reported she presented with a bowel obstruction with perforation and had surgery upon diagnosis and had 2 surgeries within the first year of diagnosis. She reports a history of abscess formation and fistula development (enteroenteral fistulas?). She was eventually placed on Remicade, only for a short period of time, perhaps 2-3 infusions - she was uncertain if it worked or not, but stopped it for unclear reasons. She had been on 6MP or steroids over time otherwise, she states she has intermittently been on 6MP. She has been on mesalamines in the past which did not help. She has been treated for bacterial overgrowth over time given ileocecectomy. She has had 2 surgeries total for her Crohns.   She reports she delivered a son in June, and after her delivery she reported a flare of symptoms. She has had fairly controlled Crohns during her pregnancy but had severe hyperemesis gravidarum but Crohns was in remission during her pregnancy. She has abdominal pains and diarrhea with blood in her stools when her Crohns flares. She had been on prednisone 55m since delivery until now, although tapering and now at 155mper day. She started Humira with her first injection on Sunday. She tolerated it okay. She has not noticed much difference since starting. She has held 6MP prior to pregnancies. She is still breast feeding. She has a lot of gas and bloating consistent with her prior bacterial overgrowth discomfort.   Her last colonoscopy she thinks was roughly 2014 time frame. CT scan a few months ago showed ? Colonic stricture without obvious active Crohns otherwise.  She otherwise has a history of uveitis and iritis with Cronhns flares. [facility] She reports she also has  significant back pain and arthralgias due to Crohns flares. She takes tramadol for pain, does not NSAIDs. Her pregnancy was complicated by PE x 2. Was on coumadin but now off all anticoagulants.  SINCE LAST VISIT:  She has been on Humira now since October. Initially she has had a good response in regards to her GI symptoms. Her colonoscopy in November showed excellent control of the Crohns, surgical anastomosis was widely patent with Rutgeert's score of i1. Unfortunately she had complained of joint pains, all over her joints bother her but worst in the knees, since our last visit, and I referred her to see Rheumatology and I spoke with Dr. BeAmil Amenbout her case. Unclear if joint pains were from Crohns vs. Side effect from Humira vs. Fibromyalgia vs. Another inflammatory arthropathy at the time.   She reports she had stopped Humira for roughly 2 weeks past due, held for a month or so total. When doing this she thinks her knees and mobility got significantly better although some of her other joints did not improve as much. She reports after stopping it however her abdomen started bothering. She was having increased in stool frequency off Humira with abdominal cramping and did not like how she felt. She then went back on Humira, she dosed herself about 10 days ago or so. She thinks after one dose, the next day her joint pains came back very quickly and it was a noticeable change and her knees were back to how they felt previously. She thinks her bowel symptoms were improved when taking it, it  helps she thinks for the first 4-5 days but doesn't provide lasting relief. She is having roughly 5-10 BMs per day at present time. She doesn't have any significant abdominal pains. She thinks she may have lost a little bit of weight recently. 5 lbs or so. She is also experiencing significant bloating and gas production. I previously had given her a course of Rifaximin for 2 weeks the last time when I saw her for possible  SIBO given her prior ileocectomy, and she thinks it provided significant relief of her symptoms at the time.   Last colonoscopy 10/2015 - the colon was healthy in appearance without inflammatory changes. Very mild loss of vascularity noted in the sigmoid colon but no ulcerations or erosions. The surgical anastomosis was widely patent and appeared healthy with 3 apthous ulcerations noted. Rutgeerts' score of i1. The ileum was intubated and appeared healthy without inflammatory changes. Mild diverticulosis was noted in the left colon. Retroflexed views revealed internal hemorrhoids.   Past Medical History  Diagnosis Date  . Crohn disease ([place]) 2000  . Asthma   . Endometriosis   . Dysautonomia     recurrent syncope s/p ILR by Dr Doreatha Lew  . Palpitations   . Anxiety   . GERD (gastroesophageal reflux disease)   . Vertigo   . Anginal pain ([place]) only on 09/22/2014  . H/O hiatal hernia   . Chest pain     a. s/p intermediate risk nuclear stress test on 09/23/14 and normal LHC on 09/24/14   . Chronic nausea   . Migraine   . Pulmonary embolism affecting pregnancy      Past Surgical History  Procedure Laterality Date  . Ileocecetomy  2002 X 2  . Loop recorder implant  11/2009    by DR Doreatha Lew  . Knee arthroscopy Bilateral 1988-1990  . Cesarean section  2011    emergency  CS due to placental abruption  . Partial colectomy  2002    partial removed due to crohns  . Appendectomy  ~ 1992  . Tonsillectomy  ~ 1996  . US echocardiography  11/11/2009    EF 55-60%  . Laparoscopy for ectopic pregnancy  11/2012  . Nasal sinus surgery  ~ 2007  . Endoscopic release transverse carpal ligament of hand Right 12/2010  . Colon surgery    . Cystoscopy w/ stone manipulation  X 2  . Cesarean section  2014    "preeclampsia"  . Left heart catheterization with coronary angiogram N/A 09/24/2014    Procedure: LEFT HEART CATHETERIZATION WITH CORONARY ANGIOGRAM;  Surgeon: Leonie Man, MD;  Location: [facility] CATH  LAB;  Service: Cardiovascular;  Laterality: N/A;   Family History  Problem Relation Age of Onset  . Hypertension Mother   . Hyperlipidemia Mother   . Arthritis Mother   . Diabetes Father   . Coronary artery disease Father   . Heart disease Father 49    MI age 89  . Hyperlipidemia Brother   . Hypertension Brother   . Colon cancer Paternal Grandmother    Social History  Substance Use Topics  . Smoking status: Never Smoker   . Smokeless tobacco: Never Used  . Alcohol Use: 0.0 oz/week    0 Standard drinks or equivalent per week     Comment: 09/22/2014 "glass of wine maybe once/month"   Current Outpatient Prescriptions  Medication Sig Dispense Refill  . acetaminophen (TYLENOL) 500 MG tablet Take 1,000 mg by mouth every 6 (six) hours as needed for headache.    Marland Kitchen  Adalimumab (HUMIRA PEN) 40 MG/0.8ML PNKT Inject 40 mg into the skin every 14 (fourteen) days. 2 each 3  . albuterol (PROVENTIL HFA;VENTOLIN HFA) 108 (90 BASE) MCG/ACT inhaler Inhale 2 puffs into the lungs every 6 (six) hours as needed for wheezing or shortness of breath. 1 Inhaler 2  . ALPRAZolam (XANAX) 0.25 MG tablet TAKE 2 TABLETS BY MOUTH EVERY DAY AS NEEDED 30 tablet 0  . butalbital-acetaminophen-caffeine (FIORICET, ESGIC) 50-325-40 MG tablet TAKE 2 TABLETS EVERY 6 HOURS AS NEEDED 20 tablet 0  . Cetirizine HCl (ZYRTEC ALLERGY PO) Take 1 tablet by mouth daily.     . cyanocobalamin (,VITAMIN B-12,) 1000 MCG/ML injection Inject 1,000 mcg into the muscle every 21 ( twenty-one) days.    . cyclobenzaprine (FLEXERIL) 10 MG tablet Take 1 tablet (10 mg total) by mouth at bedtime as needed for muscle spasms. 15 tablet 0  . dexlansoprazole (DEXILANT) 60 MG capsule Take 1 capsule (60 mg total) by mouth daily. 30 capsule 5  . dicyclomine (BENTYL) 20 MG tablet Take 20 mg by mouth 3 (three) times daily as needed for spasms.     Marland Kitchen HYDROcodone-homatropine (HYCODAN) 5-1.5 MG/5ML syrup 1 tsp po at night before bed prn cough 180 mL 0  .  HYDROmorphone (DILAUDID) 2 MG tablet Take 1/2 to 1 tab every 4-6 hours as needed for pain. 30 tablet 0  . lactobacillus acidophilus (BACID) TABS tablet Take 2 tablets by mouth 3 (three) times daily.    . Prenatal Vit-Fe Fumarate-FA (PRENATAL PO) Take 2 tablets by mouth every evening. Prenatal gummy vitamin.    . ranitidine (ZANTAC) 150 MG tablet Take 1 tablet (150 mg total) by mouth every morning. 30 tablet 3  . rifaximin (XIFAXAN) 550 MG TABS tablet Take 1 tablet (550 mg total) by mouth 3 (three) times daily. 42 tablet 0  . sertraline (ZOLOFT) 100 MG tablet Take 1 tablet (100 mg total) by mouth daily. 30 tablet 3  . traMADol (ULTRAM) 50 MG tablet Take 50 mg by mouth every 6 (six) hours as needed for moderate pain or severe pain.    Marland Kitchen zolpidem (AMBIEN) 10 MG tablet TAKE ONE TABLET BY MOUTH EVERY NIGHT AT BEDTIME 30 tablet 0   No current facility-administered medications for this visit.   Allergies  Allergen Reactions  . Compazine [Prochlorperazine Edisylate] Anaphylaxis  . Reglan [Metoclopramide] Anaphylaxis and Other (See Comments)    Other reaction(s): GI Upset (intolerance) Intensifies her Chron's  . Sulfamethoxazole Rash and Other (See Comments)    Internal bleeding and kidney infection  . Biaxin [Clarithromycin] Other (See Comments)    Cant take due to Chrons disease  . Codeine Nausea And Vomiting and Other (See Comments)    Dilaudid and demerol OK  . Hydrocodone Nausea And Vomiting  . Macrobid [Nitrofurantoin] Nausea And Vomiting    Other reaction(s): GI Upset (intolerance)  . Promethazine Rash and Other (See Comments)    Muscular seizures  . Sulfa Antibiotics Hives and Other (See Comments)    Hematemesis; documented while a young child     Review of Systems: All systems reviewed and negative except where noted in HPI.   Lab Results  Component Value Date   WBC 11.0* 10/18/2015   HGB 10.5* 10/18/2015   HCT 33.2* 10/18/2015   MCV 75.3* 10/18/2015   PLT 393.0 10/18/2015     Lab Results  Component Value Date   CREATININE 0.84 10/18/2015   BUN 15 10/18/2015   NA 139 10/18/2015   K 3.9  10/18/2015   CL 104 10/18/2015   CO2 27 10/18/2015    Lab Results  Component Value Date   ALT 16 10/18/2015   AST 15 10/18/2015   ALKPHOS 76 10/18/2015   BILITOT 0.2 10/18/2015    Lab Results  Component Value Date   ESRSEDRATE 21 10/18/2015    Lab Results  Component Value Date   CRP 0.2* 10/18/2015     Physical Exam: BP 98/60 mmHg  Pulse 72  Ht 5' 2"  (1.575 m)  Wt 149 lb (67.586 kg)  BMI 27.25 kg/m2  LMP 01/16/2016 Constitutional: Pleasant,well-developed, female in no acute distress. HEENT: Normocephalic and atraumatic. Conjunctivae are normal. No scleral icterus. Neck supple.  Cardiovascular: Normal rate, regular rhythm.  Pulmonary/chest: Effort normal and breath sounds normal. No wheezing, rales or rhonchi. Abdominal: Soft, nondistended, mild LLQ TTP without rebound or guarding. Bowel sounds active throughout. There are no masses palpable. No hepatomegaly. Extremities: no edema Lymphadenopathy: No cervical adenopathy noted. Neurological: Alert and oriented to person place and time. Skin: Skin is warm and dry. No rashes noted. Psychiatric: Normal mood and affect. Behavior is normal.   ASSESSMENT AND PLAN: 40 y/o female with longstanding ileocolonic stricturing / penetrating Crohn's, history of 2 bowel resections, complicated by extraintestinal manifestations to include iritis / uveitis and joint pains. History as above. Short lived therapy with Remicade remotely, on 6MP intermittently although limited by severe nausea with this, multiple courses of steroids. She had been on Humira since early October. Colonoscopy in November showed excellent control of disease with no significant inflammatory changes. Unfortunately she developed debilitating arthralgias while on Humira and has not been able to function well. I referred her to Rheumatology - differential  was drug reaction from Humira, Crohn's related arthropathy, other inflammatory arthropathy, versus Fibromylagia. She held Humira for one dose, and had not been on it for a month and her joint pains significantly improved. She then took one more dose to see if her symptoms would recur, and they came back severely the next day. At this time I do think she is having a Humira related drug reaction causing the arthropathy given her history. We discussed this for a bit, and given she hasn't been able to function well through this, I think we need to stop Humira at this time.   Moving forward we discussed options to treat her Crohn's. Long-term given her severe disease and young age I think she warrants combination immunosuppresive therapy. I am recommending thiopurine or methotrexate plus either anti-TNF or Entyvio. I discussed the risks / benefits of each of these at length with her today, as well as the risks of combination therapy. She has had a tubal ligation, does not drink any alcohol, and following discussion of methotrexate she wished to start this. Moving forward, given she only had a few doses of Remicade we consider resuming this given she responded well to Humira versus starting Entyvio.   We will check baseline labs today, and if stable start Methotrexate 36m SQ q week and will take daily folic acid supplement. I will make sure she tolerates this and that her joint pains resolve prior to starting her on the next biologic. I don't think methotrexate will likely control her disease monotherapy in the long term, perhaps we may continue it for at least a year with combination therapy. Otherwise, for now, will also give her a refill of Rifaximin as she has symptoms c/w SIBO and has risk factors for this (prior ileocectomy) and responded well to  a course of it previously.   Regarding health care maintenance, she is up to date on pneumo vacc and flu shot, as well as TB testing.   Finally, we will refill her  Dexilant and zantac for reflux, working well for her reflux symptoms.   Chinook Cellar, MD [facility] Pager [phone]

## 2016-01-17 NOTE — Patient Instructions (Signed)
Your physician has requested that you go to the basement for lab work before leaving today.  We have sent medications to your pharmacy for you to pick up at your convenience.

## 2016-01-20 ENCOUNTER — Other Ambulatory Visit: Payer: Self-pay | Admitting: *Deleted

## 2016-01-20 DIAGNOSIS — D509 Iron deficiency anemia, unspecified: Secondary | ICD-10-CM

## 2016-01-21 LAB — THIOPURINE METHYLTRANSFERASE (TPMT), RBC: THIOPURINE METHYLTRANSFERASE, RBC: 18 nmol/h/mL

## 2016-01-23 ENCOUNTER — Other Ambulatory Visit: Payer: Self-pay | Admitting: *Deleted

## 2016-01-23 DIAGNOSIS — K50919 Crohn's disease, unspecified, with unspecified complications: Secondary | ICD-10-CM

## 2016-01-23 MED ORDER — AZATHIOPRINE 50 MG PO TABS
ORAL_TABLET | ORAL | Status: DC
Start: 2016-01-23 — End: 2016-06-04

## 2016-01-24 ENCOUNTER — Encounter (HOSPITAL_COMMUNITY): Payer: BC Managed Care – PPO

## 2016-01-25 ENCOUNTER — Other Ambulatory Visit: Payer: Self-pay | Admitting: Family Medicine

## 2016-01-25 NOTE — Telephone Encounter (Signed)
Last office visit 12/21/2015 with Dr. Lorelei Pont.  Last refilled 12/23/2015 for #30 with no refills.  Ok to refill?

## 2016-01-26 NOTE — Telephone Encounter (Signed)
Zolpidem called into [place].

## 2016-01-27 ENCOUNTER — Ambulatory Visit (INDEPENDENT_AMBULATORY_CARE_PROVIDER_SITE_OTHER): Payer: BC Managed Care – PPO | Admitting: Family Medicine

## 2016-01-27 ENCOUNTER — Encounter: Payer: Self-pay | Admitting: Family Medicine

## 2016-01-27 VITALS — BP 104/70 | HR 90 | Temp 97.7°F | Ht 62.0 in | Wt 147.5 lb

## 2016-01-27 DIAGNOSIS — F411 Generalized anxiety disorder: Secondary | ICD-10-CM | POA: Diagnosis not present

## 2016-01-27 DIAGNOSIS — F331 Major depressive disorder, recurrent, moderate: Secondary | ICD-10-CM | POA: Diagnosis not present

## 2016-01-27 MED ORDER — BUTALBITAL-APAP-CAFFEINE 50-325-40 MG PO TABS: ORAL_TABLET | ORAL | Status: DC

## 2016-01-27 NOTE — Progress Notes (Signed)
Pre visit review using our clinic review tool, if applicable. No additional management support is needed unless otherwise documented below in the visit note. 

## 2016-01-27 NOTE — Patient Instructions (Signed)
Continue on current dose of sertraline 100 mg .  Work on stress reduction as able.  Call if any issues.

## 2016-01-27 NOTE — Progress Notes (Signed)
   Subjective:    Patient ID: [name], female    DOB: June 21, 1976, 40 y.o.   MRN: [id number]  HPI  40 year old female with moderate major depression and anxiety follow up presents to discuss mood control. She had had worsening anxiety, panic attacks. She had been using alprazolam daily. Over MyChart  increased 100 mg of sertraline daily 1 month ago.  She has been doing better overall  on higher dose. No depression.  She has been able to decrease alprazolam to  3 x a week. Sleeping well at night on [place].   Review of Systems  Constitutional: Negative for fever and fatigue.  HENT: Negative for ear pain.   Eyes: Negative for pain.  Respiratory: Negative for chest tightness and shortness of breath.   Cardiovascular: Negative for chest pain, palpitations and leg swelling.  Gastrointestinal: Negative for abdominal pain.  Genitourinary: Negative for dysuria.       Objective:   Physical Exam  Constitutional: Vital signs are normal. She appears well-developed and well-nourished. She is cooperative.  Non-toxic appearance. She does not appear ill. No distress.  HENT:  Head: Normocephalic.  Right Ear: Hearing, tympanic membrane, external ear and ear canal normal. Tympanic membrane is not erythematous, not retracted and not bulging.  Left Ear: Hearing, tympanic membrane, external ear and ear canal normal. Tympanic membrane is not erythematous, not retracted and not bulging.  Nose: No mucosal edema or rhinorrhea. Right sinus exhibits no maxillary sinus tenderness and no frontal sinus tenderness. Left sinus exhibits no maxillary sinus tenderness and no frontal sinus tenderness.  Mouth/Throat: Uvula is midline, oropharynx is clear and moist and mucous membranes are normal.  Eyes: Conjunctivae, EOM and lids are normal. Pupils are equal, round, and reactive to light. Lids are everted and swept, no foreign bodies found.  Neck: Trachea normal and normal range of motion. Neck supple. Carotid  bruit is not present. No thyroid mass and no thyromegaly present.  Cardiovascular: Normal rate, regular rhythm, S1 normal, S2 normal, normal heart sounds, intact distal pulses and normal pulses.  Exam reveals no gallop and no friction rub.   No murmur heard. Pulmonary/Chest: Effort normal and breath sounds normal. No tachypnea. No respiratory distress. She has no decreased breath sounds. She has no wheezes. She has no rhonchi. She has no rales.  Abdominal: Soft. Normal appearance and bowel sounds are normal. There is no tenderness.  Neurological: She is alert.  Skin: Skin is warm, dry and intact. No rash noted.  Psychiatric: Her speech is normal and behavior is normal. Judgment and thought content normal. Her mood appears not anxious. Cognition and memory are normal. She does not exhibit a depressed mood.          Assessment & Plan:

## 2016-01-30 ENCOUNTER — Telehealth: Payer: Self-pay

## 2016-01-30 NOTE — Telephone Encounter (Signed)
Pt was prescribed Imuran but when she went to pick it up the pharmacy stated that she cannot take it while breast feeding. Is there an alternative?

## 2016-01-31 ENCOUNTER — Telehealth: Payer: Self-pay | Admitting: *Deleted

## 2016-01-31 ENCOUNTER — Encounter (HOSPITAL_COMMUNITY): Payer: BC Managed Care – PPO

## 2016-01-31 NOTE — Telephone Encounter (Signed)
Received fax from [facility] requesting PA for Butalbital-APAP-Caffeine.  Forms placed in Dr. Rometta Emery in box to complete.

## 2016-01-31 NOTE — Telephone Encounter (Signed)
I called the patient about this and left her a message. Imuran is safe to use during breastfeeding, it is methotrexate which is not safe with breast feeding. This recommendations is based on recent articles about the safety of IBD medications in pregnancy and with breastfeeding put out by the AGA. Some authors recommend avoiding breast feeding within 4 hours of taking Imuran, but there is not a lot of evidence behind this recommendation. I called the patient and left her a message. Can you please call and let her know that I recommend she pick up Imuran and start taking it, I will check her labs (CBC, LFTs) 2 weeks after she starts taking it. Thanks

## 2016-01-31 NOTE — Telephone Encounter (Signed)
In outbox

## 2016-01-31 NOTE — Telephone Encounter (Signed)
Left message for [name] to return my call.

## 2016-02-01 NOTE — Telephone Encounter (Signed)
Spoke with [place].  She has previously tried Tylenol, Motrin, Zomig & Imitrex.  She states Fioricet is the only Migraine medication safe to take while she is breastfeeding.  PA completed and faxed back to [place] at [phone]. Awaiting decision.

## 2016-02-01 NOTE — Telephone Encounter (Signed)
Called pt and informed her. She states she will start taking Imuran this weekend.

## 2016-02-03 NOTE — Telephone Encounter (Signed)
PA approved from 02/02/2016 through 02/01/2017.  [place] Notified.

## 2016-02-06 ENCOUNTER — Encounter (HOSPITAL_COMMUNITY)
Admission: RE | Admit: 2016-02-06 | Discharge: 2016-02-06 | Disposition: A | Discharge status: Home / Self Care | Payer: BC Managed Care – PPO | Source: Ambulatory Visit | Attending: Gastroenterology | Admitting: Gastroenterology

## 2016-02-06 ENCOUNTER — Encounter (HOSPITAL_COMMUNITY): Payer: Self-pay

## 2016-02-06 VITALS — BP 104/67 | HR 83 | Temp 98.4°F | Resp 18 | Ht 64.0 in | Wt 144.4 lb

## 2016-02-06 DIAGNOSIS — D509 Iron deficiency anemia, unspecified: Secondary | ICD-10-CM | POA: Diagnosis present

## 2016-02-06 MED ORDER — SODIUM CHLORIDE 0.9 % IV SOLN
INTRAVENOUS | Status: DC
  Administered 2016-02-06: 09:00:00 via INTRAVENOUS

## 2016-02-06 MED ORDER — SODIUM CHLORIDE 0.9 % IV SOLN
200.0000 mg | INTRAVENOUS | Status: DC
  Administered 2016-02-06: 200 mg via INTRAVENOUS
  Filled 2016-02-06: qty 10

## 2016-02-06 NOTE — Progress Notes (Signed)
Pt here for her first Venofer infusion. 284m in 1549mover 90 minutes at 107 ml/hr. Pt states she felt some"minor burning" at infusion site. No redness noted , offered to reduce rate but patient declined. VSS thru out the infusion but midway thru pt states "I don't feel that great" I asked patient to describe in more detail and she was "having some mild nausea" and "Having some mild abdominal cramping" declined offer to slow infusion down. At the end of the infusion and 15 minutes after observing pt states she is "feeling OK" and she ambulated in room to BR.Discussed with patient that at her next infusion we would begin it at a slower rate and she would take Zofran at home prior to arrival. She also mentioned as I was taking her IV out that "I can't use CHG , it blisters after I use it a couple of times, maybe that is why my IV site was burning". I noted this intolerance on her ALLERGIES. IV was d/cd and still no redness swelling or blistering. Pt and husband discharged ambulatory with next scheduled appointment for

## 2016-02-06 NOTE — Discharge Instructions (Signed)
Iron Sucrose injection  What is this medicine?  IRON SUCROSE (AHY ern SOO krohs) is an iron complex. Iron is used to make healthy red blood cells, which carry oxygen and nutrients throughout the body. This medicine is used to treat iron deficiency anemia in people with chronic kidney disease.  This medicine may be used for other purposes; ask your health care provider or pharmacist if you have questions.  What should I tell my health care provider before I take this medicine?  They need to know if you have any of these conditions:  -anemia not caused by low iron levels  -heart disease  -high levels of iron in the blood  -kidney disease  -liver disease  -an unusual or allergic reaction to iron, other medicines, foods, dyes, or preservatives  -pregnant or trying to get pregnant  -breast-feeding  How should I use this medicine?  This medicine is for infusion into a vein. It is given by a health care professional in a hospital or clinic setting.  Talk to your pediatrician regarding the use of this medicine in children. While this drug may be prescribed for children as young as 2 years for selected conditions, precautions do apply.  Overdosage: If you think you have taken too much of this medicine contact a poison control center or emergency room at once.  NOTE: This medicine is only for you. Do not share this medicine with others.  What if I miss a dose?  It is important not to miss your dose. Call your doctor or health care professional if you are unable to keep an appointment.  What may interact with this medicine?  Do not take this medicine with any of the following medications:  -deferoxamine  -dimercaprol  -other iron products  This medicine may also interact with the following medications:  -chloramphenicol  -deferasirox  This list may not describe all possible interactions. Give your health care provider a list of all the medicines, herbs, non-prescription drugs, or dietary supplements you use. Also tell them if  you smoke, drink alcohol, or use illegal drugs. Some items may interact with your medicine.  What should I watch for while using this medicine?  Visit your doctor or healthcare professional regularly. Tell your doctor or healthcare professional if your symptoms do not start to get better or if they get worse. You may need blood work done while you are taking this medicine.  You may need to follow a special diet. Talk to your doctor. Foods that contain iron include: whole grains/cereals, dried fruits, beans, or peas, leafy green vegetables, and organ meats (liver, kidney).  What side effects may I notice from receiving this medicine?  Side effects that you should report to your doctor or health care professional as soon as possible:  -allergic reactions like skin rash, itching or hives, swelling of the face, lips, or tongue  -breathing problems  -changes in blood pressure  -cough  -fast, irregular heartbeat  -feeling faint or lightheaded, falls  -fever or chills  -flushing, sweating, or hot feelings  -joint or muscle aches/pains  -seizures  -swelling of the ankles or feet  -unusually weak or tired  Side effects that usually do not require medical attention (report to your doctor or health care professional if they continue or are bothersome):  -diarrhea  -feeling achy  -headache  -irritation at site where injected  -nausea, vomiting  -stomach upset  -tiredness  This list may not describe all possible side effects. Call your doctor   for medical advice about side effects. You may report side effects to FDA at 1-800-FDA-1088.  Where should I keep my medicine?  This drug is given in a hospital or clinic and will not be stored at home.  NOTE: This sheet is a summary. It may not cover all possible information. If you have questions about this medicine, talk to your doctor, pharmacist, or health care provider.      2016, Elsevier/Gold Standard. (2011-09-20 17:14:35)

## 2016-02-13 ENCOUNTER — Encounter (HOSPITAL_COMMUNITY): Payer: Self-pay

## 2016-02-13 ENCOUNTER — Encounter (HOSPITAL_COMMUNITY)
Admission: RE | Admit: 2016-02-13 | Discharge: 2016-02-13 | Disposition: A | Discharge status: Home / Self Care | Payer: BC Managed Care – PPO | Source: Ambulatory Visit | Attending: Gastroenterology | Admitting: Gastroenterology

## 2016-02-13 VITALS — BP 126/75 | HR 80 | Temp 98.2°F | Resp 18 | Ht 64.0 in | Wt 146.8 lb

## 2016-02-13 DIAGNOSIS — D509 Iron deficiency anemia, unspecified: Secondary | ICD-10-CM

## 2016-02-13 MED ORDER — IRON SUCROSE 20 MG/ML IV SOLN
200.0000 mg | INTRAVENOUS | Status: DC
  Administered 2016-02-13: 200 mg via INTRAVENOUS
  Filled 2016-02-13: qty 10

## 2016-02-13 MED ORDER — SODIUM CHLORIDE 0.9 % IV SOLN
INTRAVENOUS | Status: DC
  Administered 2016-02-13: 09:00:00 via INTRAVENOUS

## 2016-02-13 NOTE — Progress Notes (Signed)
Uneventful #2/5 infusion of Venofer. Pt has tolerated this infusion at a rate of 58m/hr. Denies any nausea and VSS. Discharged accompanied by friend to [employer] planner

## 2016-02-20 ENCOUNTER — Encounter (HOSPITAL_COMMUNITY)
Admission: RE | Admit: 2016-02-20 | Discharge: 2016-02-20 | Disposition: A | Discharge status: Home / Self Care | Payer: BC Managed Care – PPO | Source: Ambulatory Visit | Attending: Gastroenterology | Admitting: Gastroenterology

## 2016-02-20 ENCOUNTER — Encounter (HOSPITAL_COMMUNITY): Payer: Self-pay

## 2016-02-20 VITALS — BP 112/86 | HR 85 | Temp 98.2°F | Resp 18 | Ht 64.0 in | Wt 145.2 lb

## 2016-02-20 DIAGNOSIS — D509 Iron deficiency anemia, unspecified: Secondary | ICD-10-CM | POA: Diagnosis not present

## 2016-02-20 MED ORDER — SODIUM CHLORIDE 0.9 % IV SOLN
INTRAVENOUS | Status: DC
  Administered 2016-02-20: 12:00:00 via INTRAVENOUS

## 2016-02-20 MED ORDER — SODIUM CHLORIDE 0.9 % IV SOLN
200.0000 mg | INTRAVENOUS | Status: DC
  Administered 2016-02-20: 200 mg via INTRAVENOUS
  Filled 2016-02-20: qty 10

## 2016-02-20 NOTE — Discharge Instructions (Signed)
Iron Sucrose injection  What is this medicine?  IRON SUCROSE (AHY ern SOO krohs) is an iron complex. Iron is used to make healthy red blood cells, which carry oxygen and nutrients throughout the body. This medicine is used to treat iron deficiency anemia in people with chronic kidney disease.  This medicine may be used for other purposes; ask your health care provider or pharmacist if you have questions.  What should I tell my health care provider before I take this medicine?  They need to know if you have any of these conditions:  -anemia not caused by low iron levels  -heart disease  -high levels of iron in the blood  -kidney disease  -liver disease  -an unusual or allergic reaction to iron, other medicines, foods, dyes, or preservatives  -pregnant or trying to get pregnant  -breast-feeding  How should I use this medicine?  This medicine is for infusion into a vein. It is given by a health care professional in a hospital or clinic setting.  Talk to your pediatrician regarding the use of this medicine in children. While this drug may be prescribed for children as young as 2 years for selected conditions, precautions do apply.  Overdosage: If you think you have taken too much of this medicine contact a poison control center or emergency room at once.  NOTE: This medicine is only for you. Do not share this medicine with others.  What if I miss a dose?  It is important not to miss your dose. Call your doctor or health care professional if you are unable to keep an appointment.  What may interact with this medicine?  Do not take this medicine with any of the following medications:  -deferoxamine  -dimercaprol  -other iron products  This medicine may also interact with the following medications:  -chloramphenicol  -deferasirox  This list may not describe all possible interactions. Give your health care provider a list of all the medicines, herbs, non-prescription drugs, or dietary supplements you use. Also tell them if  you smoke, drink alcohol, or use illegal drugs. Some items may interact with your medicine.  What should I watch for while using this medicine?  Visit your doctor or healthcare professional regularly. Tell your doctor or healthcare professional if your symptoms do not start to get better or if they get worse. You may need blood work done while you are taking this medicine.  You may need to follow a special diet. Talk to your doctor. Foods that contain iron include: whole grains/cereals, dried fruits, beans, or peas, leafy green vegetables, and organ meats (liver, kidney).  What side effects may I notice from receiving this medicine?  Side effects that you should report to your doctor or health care professional as soon as possible:  -allergic reactions like skin rash, itching or hives, swelling of the face, lips, or tongue  -breathing problems  -changes in blood pressure  -cough  -fast, irregular heartbeat  -feeling faint or lightheaded, falls  -fever or chills  -flushing, sweating, or hot feelings  -joint or muscle aches/pains  -seizures  -swelling of the ankles or feet  -unusually weak or tired  Side effects that usually do not require medical attention (report to your doctor or health care professional if they continue or are bothersome):  -diarrhea  -feeling achy  -headache  -irritation at site where injected  -nausea, vomiting  -stomach upset  -tiredness  This list may not describe all possible side effects. Call your doctor   for medical advice about side effects. You may report side effects to FDA at 1-800-FDA-1088.  Where should I keep my medicine?  This drug is given in a hospital or clinic and will not be stored at home.  NOTE: This sheet is a summary. It may not cover all possible information. If you have questions about this medicine, talk to your doctor, pharmacist, or health care provider.      2016, Elsevier/Gold Standard. (2011-09-20 17:14:35)

## 2016-02-27 ENCOUNTER — Encounter (HOSPITAL_COMMUNITY)
Admission: RE | Admit: 2016-02-27 | Discharge: 2016-02-27 | Disposition: A | Discharge status: Home / Self Care | Payer: BC Managed Care – PPO | Source: Ambulatory Visit | Attending: Gastroenterology | Admitting: Gastroenterology

## 2016-02-27 ENCOUNTER — Encounter (HOSPITAL_COMMUNITY): Payer: Self-pay

## 2016-02-27 VITALS — BP 118/73 | HR 75 | Temp 98.3°F | Resp 18 | Ht 64.0 in | Wt 146.0 lb

## 2016-02-27 DIAGNOSIS — D509 Iron deficiency anemia, unspecified: Secondary | ICD-10-CM | POA: Insufficient documentation

## 2016-02-27 MED ORDER — SODIUM CHLORIDE 0.9 % IV SOLN
INTRAVENOUS | Status: DC
  Administered 2016-02-27: 10:00:00 via INTRAVENOUS

## 2016-02-27 MED ORDER — SODIUM CHLORIDE 0.9 % IV SOLN
200.0000 mg | INTRAVENOUS | Status: DC
  Administered 2016-02-27: 200 mg via INTRAVENOUS
  Filled 2016-02-27: qty 10

## 2016-02-27 NOTE — Discharge Instructions (Signed)
Iron Sucrose injection  What is this medicine?  IRON SUCROSE (AHY ern SOO krohs) is an iron complex. Iron is used to make healthy red blood cells, which carry oxygen and nutrients throughout the body. This medicine is used to treat iron deficiency anemia in people with chronic kidney disease.  This medicine may be used for other purposes; ask your health care provider or pharmacist if you have questions.  What should I tell my health care provider before I take this medicine?  They need to know if you have any of these conditions:  -anemia not caused by low iron levels  -heart disease  -high levels of iron in the blood  -kidney disease  -liver disease  -an unusual or allergic reaction to iron, other medicines, foods, dyes, or preservatives  -pregnant or trying to get pregnant  -breast-feeding  How should I use this medicine?  This medicine is for infusion into a vein. It is given by a health care professional in a hospital or clinic setting.  Talk to your pediatrician regarding the use of this medicine in children. While this drug may be prescribed for children as young as 2 years for selected conditions, precautions do apply.  Overdosage: If you think you have taken too much of this medicine contact a poison control center or emergency room at once.  NOTE: This medicine is only for you. Do not share this medicine with others.  What if I miss a dose?  It is important not to miss your dose. Call your doctor or health care professional if you are unable to keep an appointment.  What may interact with this medicine?  Do not take this medicine with any of the following medications:  -deferoxamine  -dimercaprol  -other iron products  This medicine may also interact with the following medications:  -chloramphenicol  -deferasirox  This list may not describe all possible interactions. Give your health care provider a list of all the medicines, herbs, non-prescription drugs, or dietary supplements you use. Also tell them if  you smoke, drink alcohol, or use illegal drugs. Some items may interact with your medicine.  What should I watch for while using this medicine?  Visit your doctor or healthcare professional regularly. Tell your doctor or healthcare professional if your symptoms do not start to get better or if they get worse. You may need blood work done while you are taking this medicine.  You may need to follow a special diet. Talk to your doctor. Foods that contain iron include: whole grains/cereals, dried fruits, beans, or peas, leafy green vegetables, and organ meats (liver, kidney).  What side effects may I notice from receiving this medicine?  Side effects that you should report to your doctor or health care professional as soon as possible:  -allergic reactions like skin rash, itching or hives, swelling of the face, lips, or tongue  -breathing problems  -changes in blood pressure  -cough  -fast, irregular heartbeat  -feeling faint or lightheaded, falls  -fever or chills  -flushing, sweating, or hot feelings  -joint or muscle aches/pains  -seizures  -swelling of the ankles or feet  -unusually weak or tired  Side effects that usually do not require medical attention (report to your doctor or health care professional if they continue or are bothersome):  -diarrhea  -feeling achy  -headache  -irritation at site where injected  -nausea, vomiting  -stomach upset  -tiredness  This list may not describe all possible side effects. Call your doctor   for medical advice about side effects. You may report side effects to FDA at 1-800-FDA-1088.  Where should I keep my medicine?  This drug is given in a hospital or clinic and will not be stored at home.  NOTE: This sheet is a summary. It may not cover all possible information. If you have questions about this medicine, talk to your doctor, pharmacist, or health care provider.      2016, Elsevier/Gold Standard. (2011-09-20 17:14:35)

## 2016-03-01 NOTE — Assessment & Plan Note (Signed)
Improved control on higher dose of sertraline daily. Able to decrease alprazolam use.  Improved insomnia with ambien.

## 2016-03-01 NOTE — Assessment & Plan Note (Signed)
Improved control on sertraline. Encouraged counselor, healthy lifetstyle, exercsie.

## 2016-03-02 ENCOUNTER — Telehealth: Payer: Self-pay | Admitting: Gastroenterology

## 2016-03-02 ENCOUNTER — Telehealth: Payer: Self-pay | Admitting: Family Medicine

## 2016-03-02 ENCOUNTER — Encounter: Payer: Self-pay | Admitting: Family Medicine

## 2016-03-02 ENCOUNTER — Ambulatory Visit (INDEPENDENT_AMBULATORY_CARE_PROVIDER_SITE_OTHER): Payer: BC Managed Care – PPO | Admitting: Family Medicine

## 2016-03-02 VITALS — BP 110/70 | HR 96 | Temp 98.2°F | Ht 64.0 in | Wt 146.5 lb

## 2016-03-02 DIAGNOSIS — D899 Disorder involving the immune mechanism, unspecified: Secondary | ICD-10-CM

## 2016-03-02 DIAGNOSIS — J0100 Acute maxillary sinusitis, unspecified: Secondary | ICD-10-CM | POA: Diagnosis not present

## 2016-03-02 DIAGNOSIS — D849 Immunodeficiency, unspecified: Secondary | ICD-10-CM

## 2016-03-02 MED ORDER — AZITHROMYCIN 250 MG PO TABS: ORAL_TABLET | ORAL | Status: DC

## 2016-03-02 MED ORDER — HYDROCODONE-HOMATROPINE 5-1.5 MG/5ML PO SYRP: 5.0000 mL | ORAL_SOLUTION | Freq: Every evening | ORAL | Status: DC | PRN

## 2016-03-02 NOTE — Progress Notes (Signed)
   Subjective:    Patient ID: [name], female    DOB: 11-10-1976, 40 y.o.   MRN: [id number]  Cough The current episode started in the past 7 days. The problem has been gradually worsening. The cough is productive of purulent sputum. Associated symptoms include ear congestion, ear pain, nasal congestion and a sore throat. Pertinent negatives include no chills, fever or hemoptysis. Associated symptoms comments: Sinus pressure and pain, severe,  mild right ear pain. The symptoms are aggravated by lying down. Risk factors: nonsmoker. She has tried prescription cough suppressant (OTC cold med) for the symptoms. The treatment provided mild relief. Her past medical history is significant for environmental allergies. There is no history of asthma, bronchiectasis, COPD, emphysema or pneumonia.   No recent antibiotics.  She is currently on immunosuppresant (imuran) for chron's Social History /Family History/Past Medical History reviewed and updated if needed.  Review of Systems  Constitutional: Negative for fever and chills.  HENT: Positive for ear pain and sore throat.   Respiratory: Positive for cough. Negative for hemoptysis.   Allergic/Immunologic: Positive for environmental allergies.       Objective:   Physical Exam  Constitutional: Vital signs are normal. She appears well-developed and well-nourished. She is cooperative.  Non-toxic appearance. She does not appear ill. No distress.  HENT:  Head: Normocephalic.  Right Ear: Hearing, external ear and ear canal normal. Tympanic membrane is not erythematous, not retracted and not bulging. A middle ear effusion is present.  Left Ear: Hearing, external ear and ear canal normal. Tympanic membrane is not erythematous, not retracted and not bulging. A middle ear effusion is present.  Nose: Mucosal edema and rhinorrhea present. Right sinus exhibits maxillary sinus tenderness. Right sinus exhibits no frontal sinus tenderness. Left sinus exhibits  maxillary sinus tenderness. Left sinus exhibits no frontal sinus tenderness.  Mouth/Throat: Uvula is midline, oropharynx is clear and moist and mucous membranes are normal.  Eyes: Conjunctivae, EOM and lids are normal. Pupils are equal, round, and reactive to light. Lids are everted and swept, no foreign bodies found.  Neck: Trachea normal and normal range of motion. Neck supple. Carotid bruit is not present. No thyroid mass and no thyromegaly present.  Cardiovascular: Normal rate, regular rhythm, S1 normal, S2 normal, normal heart sounds, intact distal pulses and normal pulses.  Exam reveals no gallop and no friction rub.   No murmur heard. Pulmonary/Chest: Effort normal and breath sounds normal. No tachypnea. No respiratory distress. She has no decreased breath sounds. She has no wheezes. She has no rhonchi. She has no rales.  Neurological: She is alert.  Skin: Skin is warm, dry and intact. No rash noted.  Psychiatric: Her speech is normal and behavior is normal. Judgment normal. Her mood appears not anxious. Cognition and memory are normal. She does not exhibit a depressed mood.          Assessment & Plan:

## 2016-03-02 NOTE — Telephone Encounter (Signed)
Patient Name: [name]  DOB: 08/24/1976    Initial Comment caller states she has sinus sx and cough   Nurse Assessment  Nurse: Mallie Mussel, RN, Alveta Heimlich Date/Time Eilene Ghazi Time): 03/02/2016 10:46:14 AM  Confirm and document reason for call. If symptomatic, describe symptoms. You must click the next button to save text entered. ---Caller states that she has sinus symptoms and a cough which began on Monday and has become progressively worse. She is on an immunodepressant for Crohn's Disease and so she doesn't have any way of fighting this. Denies fever. She also has asthma. She feels that this is beginning to settle in her chest. Denies difficulty breathing. She rates her pain as 8 on 0-10 scale. She also has a sore throat with this from the drainage.  Has the patient traveled out of the country within the last 30 days? ---No  Does the patient have any new or worsening symptoms? ---Yes  Will a triage be completed? ---Yes  Related visit to physician within the last 2 weeks? ---No  Does the PT have any chronic conditions? (i.e. diabetes, asthma, etc.) ---Yes  List chronic conditions. ---Crohn's, Asthma  Is the patient pregnant or possibly pregnant? (Ask all females between the ages of 33-55) ---No  Is this a behavioral health or substance abuse call? ---No     Guidelines    Guideline Title Affirmed Question Affirmed Notes  Sinus Pain or Congestion Lots of coughing    Final Disposition User   See Physician within [place], RN, Alveta Heimlich    Comments  Caller was advised, from checking earlier, that there are no appointments available today at either [facility] or [facility]. She asked if she could have Dr. Rometta Emery nurse Butch Penny to give her a call. I advised her that I will forward that request for her.   Referrals  REFERRED TO PCP OFFICE   Disagree/Comply: Disagree  Disagree/Comply Reason: Disagree with instructions

## 2016-03-02 NOTE — Telephone Encounter (Signed)
Spoke to Dr. Diona Browner who agreed to see the patient this afternoon.  Patient is aware of 1615 appointment time.

## 2016-03-02 NOTE — Telephone Encounter (Signed)
Patient is on Imuran now. Off Humira due to joint pain.

## 2016-03-02 NOTE — Telephone Encounter (Signed)
Spoke with patient and she states she is having sinus problems and feels like it is going to her chest. 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 Her PCP cannot see her. She is asking if Dr. Havery Moros can see her. Suggested patient try an Urgent Care or E- visit for this.

## 2016-03-02 NOTE — Progress Notes (Signed)
Pre visit review using our clinic review tool, if applicable. No additional management support is needed unless otherwise documented below in the visit note. 

## 2016-03-02 NOTE — Telephone Encounter (Signed)
Yes thanks for reminding me. She should continue Imuran, no need to hold that for her symptoms. I wanted to make sure she had tolerated it but we were also considering Remicade vs. Entyvio. Are her joint pains better? We need to decide between Remicade and Entyvio soon as I do think she warrants combining one of these with the Imuran. May be good to see her in clinic to discuss this, does she have any follow up with me? Otherwise I can call her if it will be a bit to see me in the office. Thanks

## 2016-03-02 NOTE — Telephone Encounter (Signed)
Thanks for letting me know Rollene Fare. If she has had multiple sick contacts this is probably viral. Is she having fevers or severe sinus pain? If so we can consider antibiotics for sinusitis. If she is due for her next dose of Humira soon, she may want to hold it for a week or so. If she is having fevers she should definitely hold the Humira. If afebrile she can use some supporitive care such as over the counter decongestants and nasal saline rinse if you can let her know. Thanks

## 2016-03-02 NOTE — Patient Instructions (Signed)
Nasal saline spray 2-3 times a day.  Mucinex DM during the day.  Cough supressant prescription at night.  Complete course of antibiotics.  Call if not improving as expected.

## 2016-03-05 ENCOUNTER — Encounter (HOSPITAL_COMMUNITY): Payer: Self-pay

## 2016-03-05 ENCOUNTER — Other Ambulatory Visit: Payer: Self-pay | Admitting: Family Medicine

## 2016-03-05 ENCOUNTER — Encounter (HOSPITAL_COMMUNITY)
Admission: RE | Admit: 2016-03-05 | Discharge: 2016-03-05 | Disposition: A | Discharge status: Home / Self Care | Payer: BC Managed Care – PPO | Source: Ambulatory Visit | Attending: Gastroenterology | Admitting: Gastroenterology

## 2016-03-05 VITALS — BP 115/72 | HR 75 | Temp 98.5°F | Resp 18 | Ht 64.0 in | Wt 145.2 lb

## 2016-03-05 DIAGNOSIS — D509 Iron deficiency anemia, unspecified: Secondary | ICD-10-CM | POA: Diagnosis not present

## 2016-03-05 MED ORDER — SODIUM CHLORIDE 0.9 % IV SOLN
200.0000 mg | INTRAVENOUS | Status: AC
  Administered 2016-03-05: 200 mg via INTRAVENOUS
  Filled 2016-03-05: qty 10

## 2016-03-05 MED ORDER — SODIUM CHLORIDE 0.9 % IV SOLN
INTRAVENOUS | Status: AC
  Administered 2016-03-05: 12:00:00 via INTRAVENOUS

## 2016-03-05 NOTE — Progress Notes (Signed)
Today was Day #5 of Venofer infusions.  Pt completed infusion and tolerated well.  Pt was d/c ambulatory to lobby.

## 2016-03-05 NOTE — Telephone Encounter (Signed)
Spoke with patient and she states the joint pain is better. Scheduled OV on 03/20/16 at 4:00 PM to discuss Remicade or Entyvio.

## 2016-03-05 NOTE — Discharge Instructions (Signed)
Iron Sucrose injection  What is this medicine?  IRON SUCROSE (AHY ern SOO krohs) is an iron complex. Iron is used to make healthy red blood cells, which carry oxygen and nutrients throughout the body. This medicine is used to treat iron deficiency anemia in people with chronic kidney disease.  This medicine may be used for other purposes; ask your health care provider or pharmacist if you have questions.  What should I tell my health care provider before I take this medicine?  They need to know if you have any of these conditions:  -anemia not caused by low iron levels  -heart disease  -high levels of iron in the blood  -kidney disease  -liver disease  -an unusual or allergic reaction to iron, other medicines, foods, dyes, or preservatives  -pregnant or trying to get pregnant  -breast-feeding  How should I use this medicine?  This medicine is for infusion into a vein. It is given by a health care professional in a hospital or clinic setting.  Talk to your pediatrician regarding the use of this medicine in children. While this drug may be prescribed for children as young as 2 years for selected conditions, precautions do apply.  Overdosage: If you think you have taken too much of this medicine contact a poison control center or emergency room at once.  NOTE: This medicine is only for you. Do not share this medicine with others.  What if I miss a dose?  It is important not to miss your dose. Call your doctor or health care professional if you are unable to keep an appointment.  What may interact with this medicine?  Do not take this medicine with any of the following medications:  -deferoxamine  -dimercaprol  -other iron products  This medicine may also interact with the following medications:  -chloramphenicol  -deferasirox  This list may not describe all possible interactions. Give your health care provider a list of all the medicines, herbs, non-prescription drugs, or dietary supplements you use. Also tell them if  you smoke, drink alcohol, or use illegal drugs. Some items may interact with your medicine.  What should I watch for while using this medicine?  Visit your doctor or healthcare professional regularly. Tell your doctor or healthcare professional if your symptoms do not start to get better or if they get worse. You may need blood work done while you are taking this medicine.  You may need to follow a special diet. Talk to your doctor. Foods that contain iron include: whole grains/cereals, dried fruits, beans, or peas, leafy green vegetables, and organ meats (liver, kidney).  What side effects may I notice from receiving this medicine?  Side effects that you should report to your doctor or health care professional as soon as possible:  -allergic reactions like skin rash, itching or hives, swelling of the face, lips, or tongue  -breathing problems  -changes in blood pressure  -cough  -fast, irregular heartbeat  -feeling faint or lightheaded, falls  -fever or chills  -flushing, sweating, or hot feelings  -joint or muscle aches/pains  -seizures  -swelling of the ankles or feet  -unusually weak or tired  Side effects that usually do not require medical attention (report to your doctor or health care professional if they continue or are bothersome):  -diarrhea  -feeling achy  -headache  -irritation at site where injected  -nausea, vomiting  -stomach upset  -tiredness  This list may not describe all possible side effects. Call your doctor   for medical advice about side effects. You may report side effects to FDA at 1-800-FDA-1088.  Where should I keep my medicine?  This drug is given in a hospital or clinic and will not be stored at home.  NOTE: This sheet is a summary. It may not cover all possible information. If you have questions about this medicine, talk to your doctor, pharmacist, or health care provider.      2016, Elsevier/Gold Standard. (2011-09-20 17:14:35)

## 2016-03-05 NOTE — Telephone Encounter (Signed)
Left a message for patient to call back. 

## 2016-03-05 NOTE — Telephone Encounter (Signed)
Okay sounds good. Thanks

## 2016-03-06 NOTE — Telephone Encounter (Signed)
Last office visit 03/02/2016.  Last refilled 12/23/2015 for #30 with no refills.  Ok to refill?

## 2016-03-06 NOTE — Telephone Encounter (Signed)
Alprazolam called into [facility].

## 2016-03-12 ENCOUNTER — Other Ambulatory Visit (INDEPENDENT_AMBULATORY_CARE_PROVIDER_SITE_OTHER): Payer: BC Managed Care – PPO

## 2016-03-12 DIAGNOSIS — K50919 Crohn's disease, unspecified, with unspecified complications: Secondary | ICD-10-CM | POA: Diagnosis not present

## 2016-03-12 LAB — HEPATIC FUNCTION PANEL
ALK PHOS: 71 U/L (ref 39–117)
ALT: 11 U/L (ref 0–35)
AST: 13 U/L (ref 0–37)
Albumin: 4.4 g/dL (ref 3.5–5.2)
BILIRUBIN DIRECT: 0.1 mg/dL (ref 0.0–0.3)
TOTAL PROTEIN: 6.8 g/dL (ref 6.0–8.3)
Total Bilirubin: 0.3 mg/dL (ref 0.2–1.2)

## 2016-03-12 LAB — CBC WITH DIFFERENTIAL/PLATELET
BASOS ABS: 0 10*3/uL (ref 0.0–0.1)
BASOS PCT: 0.5 % (ref 0.0–3.0)
EOS ABS: 0.1 10*3/uL (ref 0.0–0.7)
Eosinophils Relative: 1.1 % (ref 0.0–5.0)
HEMATOCRIT: 37.1 % (ref 36.0–46.0)
HEMOGLOBIN: 12.3 g/dL (ref 12.0–15.0)
LYMPHS PCT: 30.9 % (ref 12.0–46.0)
Lymphs Abs: 2.9 10*3/uL (ref 0.7–4.0)
MCHC: 33.2 g/dL (ref 30.0–36.0)
MCV: 81.5 fl (ref 78.0–100.0)
MONO ABS: 0.6 10*3/uL (ref 0.1–1.0)
Monocytes Relative: 6.5 % (ref 3.0–12.0)
Neutro Abs: 5.8 10*3/uL (ref 1.4–7.7)
Neutrophils Relative %: 61 % (ref 43.0–77.0)
Platelets: 307 10*3/uL (ref 150.0–400.0)
RBC: 4.55 Mil/uL (ref 3.87–5.11)
RDW: 21 % — ABNORMAL HIGH (ref 11.5–15.5)
WBC: 9.5 10*3/uL (ref 4.0–10.5)

## 2016-03-13 ENCOUNTER — Telehealth: Payer: Self-pay | Admitting: Gastroenterology

## 2016-03-13 ENCOUNTER — Other Ambulatory Visit (INDEPENDENT_AMBULATORY_CARE_PROVIDER_SITE_OTHER): Payer: BC Managed Care – PPO

## 2016-03-13 DIAGNOSIS — K50119 Crohn's disease of large intestine with unspecified complications: Secondary | ICD-10-CM | POA: Diagnosis not present

## 2016-03-13 LAB — CBC WITH DIFFERENTIAL/PLATELET
BASOS ABS: 0 10*3/uL (ref 0.0–0.1)
BASOS PCT: 0.5 % (ref 0.0–3.0)
EOS ABS: 0.1 10*3/uL (ref 0.0–0.7)
Eosinophils Relative: 1.5 % (ref 0.0–5.0)
HCT: 39.3 % (ref 36.0–46.0)
Hemoglobin: 12.9 g/dL (ref 12.0–15.0)
Lymphocytes Relative: 26.6 % (ref 12.0–46.0)
Lymphs Abs: 2.3 10*3/uL (ref 0.7–4.0)
MCHC: 32.9 g/dL (ref 30.0–36.0)
MCV: 82.4 fl (ref 78.0–100.0)
MONO ABS: 0.6 10*3/uL (ref 0.1–1.0)
Monocytes Relative: 6.7 % (ref 3.0–12.0)
NEUTROS ABS: 5.6 10*3/uL (ref 1.4–7.7)
Neutrophils Relative %: 64.7 % (ref 43.0–77.0)
Platelets: 346 10*3/uL (ref 150.0–400.0)
RBC: 4.76 Mil/uL (ref 3.87–5.11)
RDW: 20.8 % — AB (ref 11.5–15.5)
WBC: 8.6 10*3/uL (ref 4.0–10.5)

## 2016-03-13 LAB — SEDIMENTATION RATE: Sed Rate: 14 mm/hr (ref 0–22)

## 2016-03-13 LAB — C-REACTIVE PROTEIN: CRP: <0.7 mg/dL (ref 0.5–20.0)

## 2016-03-13 NOTE — Telephone Encounter (Signed)
Spoke with patient and gave her recommendations. Labs in [facility]. Patient will come for labs. She has Zofran and does not need an rx.

## 2016-03-13 NOTE — Telephone Encounter (Signed)
Spoke with patient and she reports she was given a Z pack last week. When she completed it, she had bloating and diarrhea. On Friday, she had blood in her stool, abdominal pain and frequent stools. Last night, she woke up at 1 AM vomiting which continued until 5 AM. Not vomiting now but continues to have diarrhea and abdominal pain that radiates to lower back. She thinks she is having a flare. Please, advise.

## 2016-03-13 NOTE — Telephone Encounter (Signed)
[name] can you ask her to go to the lab for C diff testing in light of recent antibiotics. We should also obtain CBC, ESR, CRP. We can give her some zofran for the nausea vomiting. I would like labs and C Diff ruled out first prior to treating her for a flare. Thanks

## 2016-03-14 LAB — CLOSTRIDIUM DIFFICILE BY PCR: CDIFFPCR: NOT DETECTED

## 2016-03-20 ENCOUNTER — Encounter: Payer: Self-pay | Admitting: Gastroenterology

## 2016-03-20 ENCOUNTER — Ambulatory Visit (INDEPENDENT_AMBULATORY_CARE_PROVIDER_SITE_OTHER): Payer: BC Managed Care – PPO | Admitting: Gastroenterology

## 2016-03-20 VITALS — BP 100/60 | HR 64 | Ht 64.0 in | Wt 144.0 lb

## 2016-03-20 DIAGNOSIS — K5090 Crohn's disease, unspecified, without complications: Secondary | ICD-10-CM | POA: Diagnosis not present

## 2016-03-20 DIAGNOSIS — Z79899 Other long term (current) drug therapy: Secondary | ICD-10-CM | POA: Diagnosis not present

## 2016-03-20 NOTE — Patient Instructions (Signed)
[name] will contact you about your Remicade treatments Follow up CBC and CMET in 1 month Contact Jones Daniel,MD at [phone] to schedule your yearly Dermatology appointment

## 2016-03-20 NOTE — Progress Notes (Signed)
HPI :  INTAKE VISIT: 40 y/o female with ileocolonic stricturing / perforating Crohn's disease - former patient of Dr. Olevia Perches, new to me, here for follow up.  Ileocolonic Crohns - diagnosed in 2002. She reported she presented with a bowel obstruction with perforation and had surgery upon diagnosis and had 2 surgeries within the first year of diagnosis. She reports a history of abscess formation and fistula development (enteroenteral fistulas?). She was eventually placed on Remicade, only for a short period of time, perhaps 2-3 infusions - she was uncertain if it worked or not, but stopped it for unclear reasons. She had been on 6MP or steroids over time otherwise, she states she has intermittently been on 6MP. She has been on mesalamines in the past which did not help. She has been treated for bacterial overgrowth over time given ileocecectomy. She has had 2 surgeries total for her Crohns.   She reports she delivered a son in June, and after her delivery she reported a flare of symptoms. She has had fairly controlled Crohns during her pregnancy but had severe hyperemesis gravidarum but Crohns was in remission during her pregnancy. She has abdominal pains and diarrhea with blood in her stools when her Crohns flares. She had been on prednisone 26m since delivery until now, although tapering and now at 121mper day. She started Humira with her first injection on Sunday. She tolerated it okay. She has not noticed much difference since starting. She has held 6MP prior to pregnancies. She is still breast feeding. She has a lot of gas and bloating consistent with her prior bacterial overgrowth discomfort.   Her last colonoscopy she thinks was roughly 2014 time frame. CT scan a few months ago showed ? Colonic stricture without obvious active Crohns otherwise.  She otherwise has a history of uveitis and iritis with Cronhns flares. [facility] She reports she also has  significant back pain and arthralgias due to Crohns flares. She takes tramadol for pain, does not NSAIDs. Her pregnancy was complicated by PE x 2. Was on coumadin but now off all anticoagulants.  LAST VISIT:  She has been on Humira now since October. Initially she has had a good response in regards to her GI symptoms. Her colonoscopy in November showed excellent control of the Crohns, surgical anastomosis was widely patent with Rutgeert's score of i1. Unfortunately she had complained of joint pains, all over her joints bother her but worst in the knees, since our last visit, and I referred her to see Rheumatology and I spoke with Dr. BeAmil Amenbout her case. Unclear if joint pains were from Crohns vs. Side effect from Humira vs. Fibromyalgia vs. Another inflammatory arthropathy at the time.   She reports she had stopped Humira for roughly 2 weeks past due, held for a month or so total. When doing this she thinks her knees and mobility got significantly better although some of her other joints did not improve as much. She reports after stopping it however her abdomen started bothering. She was having increased in stool frequency off Humira with abdominal cramping and did not like how she felt. She then went back on Humira, she dosed herself about 10 days ago or so. She thinks after one dose, the next day her joint pains came back very quickly and it was a noticeable change and her knees were back to how they felt previously. She thinks her bowel symptoms were improved when taking it, it helps  she thinks for the first 4-5 days but doesn't provide lasting relief. She is having roughly 5-10 BMs per day at present time. She doesn't have any significant abdominal pains. She thinks she may have lost a little bit of weight recently. 5 lbs or so. She is also experiencing significant bloating and gas production. I previously had given her a course of Rifaximin for 2 weeks the last time when I saw her for possible SIBO  given her prior ileocectomy, and she thinks it provided significant relief of her symptoms at the time.   Last colonoscopy 10/2015 - the colon was healthy in appearance without inflammatory changes. Very mild loss of vascularity noted in the sigmoid colon but no ulcerations or erosions. The surgical anastomosis was widely patent and appeared healthy with 3 apthous ulcerations noted. Rutgeerts' score of i1. The ileum was intubated and appeared healthy without inflammatory changes. Mild diverticulosis was noted in the left colon. Retroflexed views revealed internal hemorrhoids.  SINCE LAST VISIT:  Patient started back on Imuran and has been on IV iron infusions. She is on 178m po q day. She takes it with dinner or she becomes nauseated. She previously had severe knee pains while on Humira, which prompted [place] stop it and this has since resolved entirely and she is doing much better in this regard. She has some lower back pain / sacroileitis which has been present longstanding. When she was on Humira she was not sure if this was better. All of her other joint pains have since resolved since stopping Humira. Since being off of it however, she thinks her Crohns is not doing as well. She has been having occasional episodes of abdominal pain in the RLQ with bloating and some occasional loose stools. She is having roughly 5-10 BMS per day, and this has made her a little late for work at times. She is not having blood in the stools currently, but had some last week. Weight has been stable. She is eating okay for the most part, but can go through her quickly. She thinks on Imuran monotherapy her bowel symptoms have gotten worse.  We discussed options moving forward to treat her Crohns.  IBD Health Care Maintenance: Annual Flu Vaccine - 2016 UTD  Pneumococcal Vaccine if receiving immunosuppression: - Date UTD 2016 thinks Pap Smear annually if immunosuppressed - Date - DUE for this at present time  Skin check  / derm eval for those on biologic / thiopuriner, yearly: Date : DUE for this TB testing if on anti-TNF, yearly - October 2016 Vitamin D screening -- due Last Colonoscopy - 10/2015    Past Medical History  Diagnosis Date  . Crohn disease ([place] 2000  . Asthma   . Endometriosis   . Dysautonomia     recurrent syncope s/p ILR by Dr TDoreatha Lew . Palpitations   . Anxiety   . GERD (gastroesophageal reflux disease)   . Vertigo   . Anginal pain ([place] only on 09/22/2014  . H/O hiatal hernia   . Chest pain     a. s/p intermediate risk nuclear stress test on 09/23/14 and normal LHC on 09/24/14   . Chronic nausea   . Migraine   . Pulmonary embolism affecting pregnancy      Past Surgical History  Procedure Laterality Date  . Ileocecetomy  2002 X 2  . Loop recorder implant  11/2009    by DR TDoreatha Lew . Knee arthroscopy Bilateral 1988-1990  . Cesarean section  2011  emergency  CS due to placental abruption  . Partial colectomy  2002    partial removed due to crohns  . Appendectomy  ~ 1992  . Tonsillectomy  ~ 1996  . US echocardiography  11/11/2009    EF 55-60%  . Laparoscopy for ectopic pregnancy  11/2012  . Nasal sinus surgery  ~ 2007  . Endoscopic release transverse carpal ligament of hand Right 12/2010  . Colon surgery    . Cystoscopy w/ stone manipulation  X 2  . Cesarean section  2014    "preeclampsia"  . Left heart catheterization with coronary angiogram N/A 09/24/2014    Procedure: LEFT HEART CATHETERIZATION WITH CORONARY ANGIOGRAM;  Surgeon: Leonie Man, MD;  Location: [facility] CATH LAB;  Service: Cardiovascular;  Laterality: N/A;   Family History  Problem Relation Age of Onset  . Hypertension Mother   . Hyperlipidemia Mother   . Arthritis Mother   . Diabetes Father   . Coronary artery disease Father   . Heart disease Father 56    MI age 29  . Hyperlipidemia Brother   . Hypertension Brother   . Colon cancer Paternal Grandmother    Social History  Substance Use Topics    . Smoking status: Never Smoker   . Smokeless tobacco: Never Used  . Alcohol Use: 0.0 oz/week    0 Standard drinks or equivalent per week     Comment: 09/22/2014 "glass of wine maybe once/month"   Current Outpatient Prescriptions  Medication Sig Dispense Refill  . acetaminophen (TYLENOL) 500 MG tablet Take 1,000 mg by mouth every 6 (six) hours as needed for headache.    . albuterol (PROVENTIL HFA;VENTOLIN HFA) 108 (90 BASE) MCG/ACT inhaler Inhale 2 puffs into the lungs every 6 (six) hours as needed for wheezing or shortness of breath. 1 Inhaler 2  . ALPRAZolam (XANAX) 0.25 MG tablet TAKE 2 TABLETS BY MOUTH EVERY DAY AS NEEDED 30 tablet 0  . azaTHIOprine (IMURAN) 50 MG tablet Take 150 mg daily 90 tablet 2  . butalbital-acetaminophen-caffeine (FIORICET, ESGIC) 50-325-40 MG tablet TAKE 2 TABLETS EVERY 6 HOURS AS NEEDED 30 tablet 0  . Cetirizine HCl (ZYRTEC ALLERGY PO) Take 1 tablet by mouth daily.     . cyanocobalamin (,VITAMIN B-12,) 1000 MCG/ML injection Inject 1,000 mcg into the muscle every 21 ( twenty-one) days.    . cyclobenzaprine (FLEXERIL) 10 MG tablet Take 1 tablet (10 mg total) by mouth at bedtime as needed for muscle spasms. 15 tablet 0  . dexlansoprazole (DEXILANT) 60 MG capsule Take 1 capsule (60 mg total) by mouth daily. 30 capsule 5  . dicyclomine (BENTYL) 20 MG tablet Take 20 mg by mouth 3 (three) times daily as needed for spasms.     Marland Kitchen HYDROmorphone (DILAUDID) 2 MG tablet Take 1/2 to 1 tab every 4-6 hours as needed for pain. 30 tablet 0  . lactobacillus acidophilus (BACID) TABS tablet Take 2 tablets by mouth 3 (three) times daily.    . Prenatal Vit-Fe Fumarate-FA (PRENATAL PO) Take 2 tablets by mouth every evening. Prenatal gummy vitamin.    . ranitidine (ZANTAC) 150 MG tablet Take 1 tablet (150 mg total) by mouth every morning. 30 tablet 3  . sertraline (ZOLOFT) 100 MG tablet Take 1 tablet (100 mg total) by mouth daily. 30 tablet 3  . traMADol (ULTRAM) 50 MG tablet Take 50 mg  by mouth every 6 (six) hours as needed for moderate pain or severe pain.    Marland Kitchen zolpidem (AMBIEN) 10 MG  tablet TAKE ONE TABLET BY MOUTH EVERY NIGHT AT BEDTIME 30 tablet 0   No current facility-administered medications for this visit.   Allergies  Allergen Reactions  . Compazine [Prochlorperazine Edisylate] Anaphylaxis  . Reglan [Metoclopramide] Anaphylaxis and Other (See Comments)    Other reaction(s): GI Upset (intolerance) Intensifies her Chron's  . Sulfamethoxazole Rash and Other (See Comments)    Internal bleeding and kidney infection  . Biaxin [Clarithromycin] Other (See Comments)    Cant take due to Chrons disease  . Chlorhexidine Other (See Comments)    Blisters after using a couple of times.   . Codeine Nausea And Vomiting and Other (See Comments)    Dilaudid and demerol OK  . Hydrocodone Nausea And Vomiting  . Macrobid [Nitrofurantoin] Nausea And Vomiting    Other reaction(s): GI Upset (intolerance)  . Promethazine Rash and Other (See Comments)    Muscular seizures  . Sulfa Antibiotics Hives and Other (See Comments)    Hematemesis; documented while a young child     Review of Systems: All systems reviewed and negative except where noted in HPI.   Lab Results  Component Value Date   WBC 8.6 03/13/2016   HGB 12.9 03/13/2016   HCT 39.3 03/13/2016   MCV 82.4 03/13/2016   PLT 346.0 03/13/2016    Lab Results  Component Value Date   ALT 11 03/12/2016   AST 13 03/12/2016   ALKPHOS 71 03/12/2016   BILITOT 0.3 03/12/2016    Lab Results  Component Value Date   CREATININE 0.59 01/17/2016   BUN 13 01/17/2016   NA 139 01/17/2016   K 3.8 01/17/2016   CL 104 01/17/2016   CO2 28 01/17/2016   Lab Results  Component Value Date   ESRSEDRATE 14 03/13/2016    Lab Results  Component Value Date   CRP <0.7 03/13/2016     Physical Exam: BP 100/60 mmHg  Pulse 64  Ht 5' 4"  (1.626 m)  Wt 144 lb (65.318 kg)  BMI 24.71 kg/m2  LMP 02/01/2016 Constitutional:  Pleasant,well-developed, female in no acute distress. HEENT: Normocephalic and atraumatic. Conjunctivae are normal. No scleral icterus. Neck supple.  Cardiovascular: Normal rate, regular rhythm.  Pulmonary/chest: Effort normal and breath sounds normal. No wheezing, rales or rhonchi. Abdominal: Soft, nondistended, nontender. Bowel sounds active throughout. There are no masses palpable. No hepatomegaly. Extremities: no edema Lymphadenopathy: No cervical adenopathy noted. Neurological: Alert and oriented to person place and time. Skin: Skin is warm and dry. No rashes noted. Psychiatric: Normal mood and affect. Behavior is normal.   ASSESSMENT AND PLAN: 40 y/o female with longstanding ileocolonic stricturing / penetrating Crohn's, history of 2 bowel resections, complicated by extraintestinal manifestations to include iritis / uveitis and joint pains. History as above. On 6MP intermittently although limited by severe nausea with this, multiple courses of steroids. She had been on Humira since early October 2016. Colonoscopy in November showed excellent control of disease with no significant inflammatory changes. Unfortunately she developed debilitating arthralgias while on Humira and this was ultimately stopped following consultation with Rheumatology. Given her breastfeeding history we could not use methotrexate, and proceeded with resuming thiopurine and now on Imuran 17m daily and tolerating it okay, however while her joint pains have resolved following stopping Humira, her bowel symptoms are bothering her a little bit more.   Moving forward we discussed options to treat her Crohn's. Long-term given her severe disease and young age I think she warrants combination immunosuppresive therapy. I am recommending thiopurine plus either  anti-TNF or Entyvio. I discussed the risks / benefits of each of these at length with her today, as well as the risks of combination therapy. Following our discussion of  this, she wished to proceed with Remicade. She had one or two dose of this previously and thinks she tolerated it okay at the time, done remotely. If she does not tolerate it or have a reaction similar to Humira we will switch to [facility], however given her good response of her Crohns to Humira I would anticipate a good response to Remicade.  Her labs are up to date today and normal after IV iron infusions, her anemia resolved. We will start Remicade at 66m/kg and plan for repeat labs in 1 month.   Regarding health care maintenance, she will be referred to Dermatology for yearly skin check and recommended she follow up with primary care for PAP smear. We will add on vitamin D as part of her next lab draw.  I will see her in 3 months or sooner with symptoms.   SCarolina Cellar MD [facility] Pager [phone]

## 2016-03-21 ENCOUNTER — Telehealth: Payer: Self-pay | Admitting: *Deleted

## 2016-03-21 ENCOUNTER — Other Ambulatory Visit: Payer: Self-pay | Admitting: *Deleted

## 2016-03-21 DIAGNOSIS — K5090 Crohn's disease, unspecified, without complications: Secondary | ICD-10-CM

## 2016-03-21 NOTE — Telephone Encounter (Signed)
Referral sent for PA for Remicade.

## 2016-03-21 NOTE — Telephone Encounter (Signed)
-----   Message from Manus Gunning, MD sent at 03/20/2016  5:12 PM EDT ----- Rollene Fare can you please order Ralynn to be started on Remicade at 49m/kg. She needs induction dosing protocol. Can you please premedicate with benadryl and hydrocortizone. Thanks  I would like CBC, CMP, and vitamin D to be checked in one month after starting Remicade. Thanks

## 2016-03-22 ENCOUNTER — Other Ambulatory Visit: Payer: Self-pay | Admitting: *Deleted

## 2016-03-22 ENCOUNTER — Telehealth: Payer: Self-pay | Admitting: *Deleted

## 2016-03-22 DIAGNOSIS — K50119 Crohn's disease of large intestine with unspecified complications: Secondary | ICD-10-CM

## 2016-03-22 NOTE — Telephone Encounter (Signed)
-----  Message from Darden Dates sent at 03/22/2016 10:15 AM EDT ----- OK PER [employer] SHE HAS ALREADY MET HER DED.  SHE HAS AN OOP OF 4350 WHICH SHE HAS MET 1690.  IT WILL COVER 80% UNTIL SHE MEETS HER OOP MAX.  SO RIGHT NOW SHE WOULD HAVE 20% COINSURANCE AND IT WOULD NEED PRIOR AUTH.  LET ME KNOW IF SHE STILL WANTS TO PROCEED AND I WILL START PRECERT. THANKS  AMY ----- Message -----    From: Hulan Saas, RN    Sent: 03/21/2016  11:12 AM      To: [place], I just sent you a referral on this patient. Dr. Havery Moros wants to schedule her for Remicade induction. Just need a PA. Rollene Fare

## 2016-03-22 NOTE — Telephone Encounter (Signed)
Spoke with patient and she does want to start Remicade infusions. She understands the insurance coverage.

## 2016-03-22 NOTE — Telephone Encounter (Signed)
Labs in [facility] for 04/04/16. Patient aware of appointment date and time.

## 2016-03-22 NOTE — Telephone Encounter (Signed)
Spoke with [facility] short stay Lavern and scheduled patient on 04/06/16 at 9:00 AM for week 0. Patient will be scheduled for week 2(04/20/16) and week 6(05/18/16). Orders faxed over per Lavern's request.

## 2016-03-26 ENCOUNTER — Other Ambulatory Visit: Payer: Self-pay | Admitting: Family Medicine

## 2016-03-26 ENCOUNTER — Telehealth: Payer: Self-pay

## 2016-03-26 NOTE — Telephone Encounter (Signed)
PLEASE NOTE: All timestamps contained within this report are represented as [place] Standard Time. CONFIDENTIALTY NOTICE: This fax transmission is intended only for the addressee. It contains information that is legally privileged, confidential or otherwise protected from use or disclosure. If you are not the intended recipient, you are strictly prohibited from reviewing, disclosing, copying using or disseminating any of this information or taking any action in reliance on or regarding this information. If you have received this fax in error, please notify us immediately by telephone so that we can arrange for its return to [name]. Phone: [phone], Toll-Free: [phone], Fax: [phone] Page: 1 of 3 Call Id: [id number] [place] Patient Name: [name] Gender: Female DOB: 1976-06-17 Age: 39 Y 2 M 16 D Return Phone Number: [phone] (Primary) Address: City/State/Zip: Post Client [place] Night - Client Client Site [place] Physician Diona Browner, Amy Contact Type Call Who Is Calling Patient / Member / Family / Caregiver Call Type Triage / Clinical Relationship To Patient Self Return Phone Number [phone] (Primary) Chief Complaint Eye Pus Or Discharge Reason for Call Symptomatic / Request for [place] 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 If symptomatic, describe symptoms. You must click the next button to save text entered. ---Caller states that she has been exposed to a child in her class with pink eye and she now has eye redness and drainage.  She is on Imuran therapy and is suppose to report any form of infection. Has the patient traveled out of the country within the last 30 days? ---Not Applicable Does the patient have any new or worsening symptoms? ---Yes Will a triage be completed? ---Yes Related visit to physician within the last 2 weeks? ---No Does the PT have any chronic conditions? (i.e. diabetes, asthma, etc.) ---Yes List chronic conditions. ---Crohn, asthma Is the patient pregnant or possibly pregnant? (Ask all females between the ages of 17-55) ---No Is this a behavioral health or substance abuse call? ---No Guidelines Guideline Title Affirmed Question Affirmed Notes Nurse Date/Time (Eastern Time) Eye - Pus or Discharge [1] Eye with yellow/green discharge or eyelashes stick together AND [2] PCP standing order to call in antibiotic eye drops (all triage questions negative) Wells, RN, Marliss Coots 03/24/2016 9:59:56 AM PLEASE NOTE: All timestamps contained within this report are represented as [place] Standard Time. CONFIDENTIALTY NOTICE: This fax transmission is intended only for the addressee. It contains information that is legally privileged, confidential or otherwise protected from use or disclosure. If you are not the intended recipient, you are strictly prohibited from reviewing, disclosing, copying using or disseminating any of this information or taking any action in reliance on or regarding this information. If you have received this fax in error, please notify us immediately by telephone so that we can arrange for its return to [name]. Phone: [phone], Toll-Free: [phone], Fax: [phone] Page: 2 of 3 Call Id: [id number] [place]. Time Eilene Ghazi Time) Disposition Final User 03/24/2016 10:16:44 AM Pharmacy Call Rock Nephew, RN, Marliss Coots Reason: Called [facility] pharmacy and left a prescription authorization on the prescriber voice line for Polytrim opthalmic solution. 03/24/2016 10:12:36 AM Home Care Yes Rock Nephew, RN,  Marliss Coots  Caller Understands: Yes Disagree/Comply: Comply Care Advice Given Per Guideline HOME CARE: You should be able to treat this at home. REASSURANCE: * 'Pink Eye' is a common complication of a cold or it can be acquired from exposure to a child or adult who has had it recently. * Pink-eye responds to home treatment with antibiotic eyedrops and is not harmful to vision. EYELID CLEANSING: * Gently wash eyelids and lashes with warm water and wet cotton balls (or cotton gauze). Remove all the dried and liquid pus. * Do this as often as needed. PRESCRIPTION OPTION FOR ANTIBIOTIC EYEDROPS PER PROTOCOL - [place]: * If PCP approves calling in prescription, do so per protocol. CONTAGIOUSNESS: * Pink-eye is contagious. Try not to touch your eyes. Wash your hands frequently. Do not share towels. * You may return to work or school. Avoid physical contact (e.g. shaking hands) until the symptoms have resolved. CONTACTS: * Patients with contact lenses need to switch to glasses temporarily (Reason: to prevent damage to the cornea). * Disinfect the contacts before wearing them again (or discard them if disposable). EXPECTED COURSE: With treatment, the yellow discharge should clear up in 3 days. The red eyes may persist for several more days. CALL BACK IF: * Pus lasts over 3 days (72 hours) on treatment * Blurred vision develops * More than just mild discomfort * You become worse CARE ADVICE given per Eye - Pus or Discharge (Adult) guideline. Standing Orders Preparation Additional Instructions Route Frequency Duration Nurse Comments User Name Polytrim Eye Drops 2 drops both eyes Eye Four Times Daily 5 Days Wells, RN, Belinda Comments User: Demetrios Isaacs, RN Date/Time (Eastern Time): 03/24/2016 10:14:12 AM Caller asked if she should discard the mascara that she is currently using. Advised caller that once she has been using the drops for 24 hours she is no longer contagious. She should probably  refrain from putting anything on or near her eyes for the next couple days and that discarding anything that she has been using on her eyes, such as mascara, would be appropriate. Caller verbalized understanding. Advised per nurses' knowledge. PLEASE NOTE: All timestamps contained within this report are represented as [place] Standard Time. CONFIDENTIALTY NOTICE: This fax transmission is intended only for the addressee. It contains information that is legally privileged, confidential or otherwise protected from use or disclosure. If you are not the intended recipient, you are strictly prohibited from reviewing, disclosing, copying using or disseminating any of this information or taking any action in reliance on or regarding this information. If you have received this fax in error, please notify us immediately by telephone so that we can arrange for its return to [name]. Phone: [phone], Toll-Free: [phone], Fax: [phone] Page: 3 of 3 Call Id: [id number] [facility] [address], [place] [address] [phone] Fax: [phone] [place] Night - Client [place] - Night Date: 03/24/2016 From: QI Department To: Bedsole, Amy This is an approved standing order given by our call center nurse on your behalf. Fax to [phone] within 5 business days. Thank you. Date Eilene Ghazi Time): 03/24/2016 9:45:18 AM Triage RN: Demetrios Isaacs, RN NAME: [name] PHONE NUMBER: [phone] (Primary) BIRTHDATE: Nov 30, 1976 ADDRESS: CITY/STATE/ZIP: [place] CALLER: Self NAME: Rx Given Preparation Additional Instructions Route Frequency Duration Nurse Comments User Name Polytrim Eye Drops 2 drops both eyes Eye Four Times Daily 5 Days Wells, RN, Belinda No signature is required on standing orders.

## 2016-03-26 NOTE — Telephone Encounter (Signed)
Last office visit 01/27/2016.  Last refilled 01/26/2016 for #30 with no refills.  Ok to refill?

## 2016-03-27 DIAGNOSIS — D849 Immunodeficiency, unspecified: Secondary | ICD-10-CM | POA: Insufficient documentation

## 2016-03-27 DIAGNOSIS — J019 Acute sinusitis, unspecified: Secondary | ICD-10-CM | POA: Insufficient documentation

## 2016-03-27 DIAGNOSIS — D899 Disorder involving the immune mechanism, unspecified: Secondary | ICD-10-CM

## 2016-03-27 NOTE — Assessment & Plan Note (Addendum)
Due to crohn's treatment

## 2016-03-27 NOTE — Assessment & Plan Note (Signed)
Nasal saline spray 2-3 times a day.  Mucinex DM during the day.  Cough supressant prescription at night.  Complete course of antibiotics.

## 2016-03-27 NOTE — Telephone Encounter (Signed)
Zolpidem called into [facility].

## 2016-03-30 ENCOUNTER — Encounter (HOSPITAL_COMMUNITY): Payer: BC Managed Care – PPO

## 2016-04-05 ENCOUNTER — Other Ambulatory Visit (HOSPITAL_COMMUNITY): Payer: Self-pay

## 2016-04-05 ENCOUNTER — Telehealth: Payer: Self-pay | Admitting: Gastroenterology

## 2016-04-05 DIAGNOSIS — M545 Low back pain: Secondary | ICD-10-CM

## 2016-04-05 NOTE — Telephone Encounter (Signed)
Yes, can you please order her an xray of the sacroiliac (SI) joints if possible. Thanks

## 2016-04-05 NOTE — Telephone Encounter (Signed)
Order in [place]. Patient does not need an appointment for xray. She can go tomorrow. Patient aware.

## 2016-04-05 NOTE — Telephone Encounter (Signed)
Patient states she is still having lower back pain and Dr. Havery Moros told her he would order an xray if this continued. She is asking if she can have this done tomorrow when she has her infusion at [facility]. Please, advise.

## 2016-04-06 ENCOUNTER — Ambulatory Visit (HOSPITAL_COMMUNITY)
Admission: RE | Admit: 2016-04-06 | Discharge: 2016-04-06 | Disposition: A | Discharge status: Home / Self Care | Payer: BC Managed Care – PPO | Source: Ambulatory Visit | Attending: Gastroenterology | Admitting: Gastroenterology

## 2016-04-06 DIAGNOSIS — K5090 Crohn's disease, unspecified, without complications: Secondary | ICD-10-CM | POA: Insufficient documentation

## 2016-04-06 DIAGNOSIS — M545 Low back pain: Secondary | ICD-10-CM | POA: Insufficient documentation

## 2016-04-06 MED ORDER — HYDROCORTISONE SOD SUCCINATE 100 MG PF FOR IT USE
125.0000 mg | Freq: Once | INTRAMUSCULAR | Status: DC
Start: 2016-04-06 — End: 2016-04-06
  Filled 2016-04-06: qty 1.25

## 2016-04-06 MED ORDER — SODIUM CHLORIDE 0.9 % IV SOLN
5.0000 mg/kg | Freq: Once | INTRAVENOUS | Status: AC
  Administered 2016-04-06: 300 mg via INTRAVENOUS
  Filled 2016-04-06: qty 30

## 2016-04-06 MED ORDER — HYDROCORTISONE NA SUCCINATE PF 250 MG IJ SOLR: 120.0000 mg | Freq: Once | INTRAMUSCULAR | Status: DC

## 2016-04-06 MED ORDER — ACETAMINOPHEN 325 MG PO TABS
650.0000 mg | ORAL_TABLET | Freq: Once | ORAL | Status: AC
  Administered 2016-04-06: 650 mg via ORAL

## 2016-04-06 MED ORDER — HYDROCORTISONE NA SUCCINATE PF 250 MG IJ SOLR
125.0000 mg | Freq: Once | INTRAMUSCULAR | Status: AC
  Administered 2016-04-06: 125 mg via INTRAVENOUS
  Filled 2016-04-06: qty 125

## 2016-04-06 MED ORDER — SODIUM CHLORIDE 0.9 % IV SOLN
INTRAVENOUS | Status: DC
  Administered 2016-04-06: 10:00:00 via INTRAVENOUS

## 2016-04-06 MED ORDER — DIPHENHYDRAMINE HCL 25 MG PO CAPS
25.0000 mg | ORAL_CAPSULE | Freq: Once | ORAL | Status: AC
  Administered 2016-04-06: 25 mg via ORAL

## 2016-04-06 MED ORDER — DIPHENHYDRAMINE HCL 25 MG PO CAPS
ORAL_CAPSULE | ORAL | Status: AC
  Filled 2016-04-06: qty 1

## 2016-04-06 MED ORDER — ACETAMINOPHEN 325 MG PO TABS
ORAL_TABLET | ORAL | Status: AC
  Filled 2016-04-06: qty 2

## 2016-04-09 ENCOUNTER — Other Ambulatory Visit: Payer: Self-pay | Admitting: *Deleted

## 2016-04-09 DIAGNOSIS — K50119 Crohn's disease of large intestine with unspecified complications: Secondary | ICD-10-CM

## 2016-04-16 ENCOUNTER — Encounter (HOSPITAL_COMMUNITY): Payer: Self-pay | Admitting: Emergency Medicine

## 2016-04-16 ENCOUNTER — Emergency Department (HOSPITAL_COMMUNITY)
Admission: EM | Admit: 2016-04-16 | Discharge: 2016-04-16 | Disposition: A | Discharge status: Home / Self Care | Payer: BC Managed Care – PPO | Attending: Emergency Medicine | Admitting: Emergency Medicine

## 2016-04-16 DIAGNOSIS — K219 Gastro-esophageal reflux disease without esophagitis: Secondary | ICD-10-CM | POA: Diagnosis not present

## 2016-04-16 DIAGNOSIS — Z79899 Other long term (current) drug therapy: Secondary | ICD-10-CM | POA: Diagnosis not present

## 2016-04-16 DIAGNOSIS — F419 Anxiety disorder, unspecified: Secondary | ICD-10-CM | POA: Insufficient documentation

## 2016-04-16 DIAGNOSIS — R630 Anorexia: Secondary | ICD-10-CM | POA: Diagnosis not present

## 2016-04-16 DIAGNOSIS — G43009 Migraine without aura, not intractable, without status migrainosus: Secondary | ICD-10-CM | POA: Insufficient documentation

## 2016-04-16 DIAGNOSIS — R51 Headache: Secondary | ICD-10-CM | POA: Diagnosis present

## 2016-04-16 DIAGNOSIS — Z86711 Personal history of pulmonary embolism: Secondary | ICD-10-CM | POA: Diagnosis not present

## 2016-04-16 DIAGNOSIS — Z8742 Personal history of other diseases of the female genital tract: Secondary | ICD-10-CM | POA: Insufficient documentation

## 2016-04-16 DIAGNOSIS — J45909 Unspecified asthma, uncomplicated: Secondary | ICD-10-CM | POA: Insufficient documentation

## 2016-04-16 MED ORDER — MAGNESIUM SULFATE 2 GM/50ML IV SOLN
2.0000 g | Freq: Once | INTRAVENOUS | Status: AC
  Administered 2016-04-16: 2 g via INTRAVENOUS
  Filled 2016-04-16: qty 50

## 2016-04-16 MED ORDER — BUTALBITAL-APAP-CAFFEINE 50-325-40 MG PO TABS: ORAL_TABLET | ORAL | Status: DC

## 2016-04-16 MED ORDER — ONDANSETRON HCL 4 MG PO TABS: 4.0000 mg | ORAL_TABLET | Freq: Three times a day (TID) | ORAL | Status: DC | PRN

## 2016-04-16 MED ORDER — DIPHENHYDRAMINE HCL 50 MG/ML IJ SOLN
25.0000 mg | Freq: Once | INTRAMUSCULAR | Status: AC
  Administered 2016-04-16: 25 mg via INTRAVENOUS
  Filled 2016-04-16: qty 1

## 2016-04-16 MED ORDER — DEXAMETHASONE SODIUM PHOSPHATE 10 MG/ML IJ SOLN
10.0000 mg | Freq: Once | INTRAMUSCULAR | Status: AC
  Administered 2016-04-16: 10 mg via INTRAVENOUS
  Filled 2016-04-16: qty 1

## 2016-04-16 MED ORDER — HALOPERIDOL LACTATE 5 MG/ML IJ SOLN: 2.0000 mg | Freq: Four times a day (QID) | INTRAMUSCULAR | Status: DC | PRN

## 2016-04-16 MED ORDER — ONDANSETRON HCL 4 MG/2ML IJ SOLN
4.0000 mg | Freq: Once | INTRAMUSCULAR | Status: AC
  Administered 2016-04-16: 4 mg via INTRAVENOUS
  Filled 2016-04-16: qty 2

## 2016-04-16 MED ORDER — SODIUM CHLORIDE 0.9 % IV BOLUS (SEPSIS)
1000.0000 mL | Freq: Once | INTRAVENOUS | Status: AC
  Administered 2016-04-16: 1000 mL via INTRAVENOUS

## 2016-04-16 NOTE — ED Notes (Signed)
Pt verbalized understanding of d/c instructions, prescriptions, and follow-up care. No further questions/concerns, VSS, assisted to lobby in wheelchair.  

## 2016-04-16 NOTE — ED Provider Notes (Signed)
CSN: [id number]     Arrival date & time 04/16/16  0845 History   First MD Initiated Contact with Patient 04/16/16 [phone]     Chief Complaint  Patient presents with  . Headache     (Consider location/radiation/quality/duration/timing/severity/associated sxs/prior Treatment) Patient is a 40 y.o. female presenting with migraines and headaches. The history is provided by the patient and the spouse.  Migraine This is a recurrent problem. The current episode started yesterday. The problem occurs constantly. The problem has been unchanged. Associated symptoms include headaches, nausea, a visual change and vomiting. Pertinent negatives include no abdominal pain, change in bowel habit, chest pain, chills, congestion, coughing, fever, neck pain, numbness, rash, urinary symptoms or weakness.  Headache Pain location:  Frontal Quality:  Sharp (throbbing) Radiates to:  Does not radiate Severity currently:  10/10 Severity at highest:  10/10 Onset quality:  Gradual Duration:  2 days Timing:  Constant Progression:  Unchanged Chronicity:  Recurrent Similar to prior headaches: yes   Context: bright light and loud noise   Context: not caffeine   Relieved by:  Nothing Ineffective treatments:  Prescription medications and resting in a darkened room Associated symptoms: nausea, photophobia, visual change and vomiting   Associated symptoms: no abdominal pain, no back pain, no blurred vision, no congestion, no cough, no diarrhea, no dizziness, no fever, no focal weakness, no near-syncope, no neck pain, no neck stiffness, no numbness, no paresthesias, no seizures, no sinus pressure, no syncope and no weakness     Past Medical History  Diagnosis Date  . Crohn disease ([place]) 2000  . Asthma   . Endometriosis   . Dysautonomia     recurrent syncope s/p ILR by Dr Doreatha Lew  . Palpitations   . Anxiety   . GERD (gastroesophageal reflux disease)   . Vertigo   . Anginal pain ([place]) only on 09/22/2014  . H/O hiatal  hernia   . Chest pain     a. s/p intermediate risk nuclear stress test on 09/23/14 and normal LHC on 09/24/14   . Chronic nausea   . Migraine   . Pulmonary embolism affecting pregnancy    Past Surgical History  Procedure Laterality Date  . Ileocecetomy  2002 X 2  . Loop recorder implant  11/2009    by DR Doreatha Lew  . Knee arthroscopy Bilateral 1988-1990  . Cesarean section  2011    emergency  CS due to placental abruption  . Partial colectomy  2002    partial removed due to crohns  . Appendectomy  ~ 1992  . Tonsillectomy  ~ 1996  . US echocardiography  11/11/2009    EF 55-60%  . Laparoscopy for ectopic pregnancy  11/2012  . Nasal sinus surgery  ~ 2007  . Endoscopic release transverse carpal ligament of hand Right 12/2010  . Colon surgery    . Cystoscopy w/ stone manipulation  X 2  . Cesarean section  2014    "preeclampsia"  . Left heart catheterization with coronary angiogram N/A 09/24/2014    Procedure: LEFT HEART CATHETERIZATION WITH CORONARY ANGIOGRAM;  Surgeon: Leonie Man, MD;  Location: [facility] CATH LAB;  Service: Cardiovascular;  Laterality: N/A;   Family History  Problem Relation Age of Onset  . Hypertension Mother   . Hyperlipidemia Mother   . Arthritis Mother   . Diabetes Father   . Coronary artery disease Father   . Heart disease Father 63    MI age 47  . Hyperlipidemia Brother   . Hypertension Brother   .  Colon cancer Paternal Grandmother    Social History  Substance Use Topics  . Smoking status: Never Smoker   . Smokeless tobacco: Never Used  . Alcohol Use: 0.0 oz/week    0 Standard drinks or equivalent per week     Comment: 09/22/2014 "glass of wine maybe once/month"   OB History    Gravida Para Term Preterm AB TAB SAB Ectopic Multiple Living   4 2 0 2 1 0 0 1 0 2      Review of Systems  Constitutional: Positive for activity change and appetite change. Negative for fever and chills.  HENT: Negative for congestion and sinus pressure.   Eyes: Positive  for photophobia. Negative for blurred vision.  Respiratory: Negative for cough and shortness of breath.   Cardiovascular: Negative for chest pain, syncope and near-syncope.  Gastrointestinal: Positive for nausea and vomiting. Negative for abdominal pain, diarrhea and change in bowel habit.  Genitourinary: Negative for dysuria and difficulty urinating.  Musculoskeletal: Negative for back pain, gait problem, neck pain and neck stiffness.  Skin: Negative for rash.  Neurological: Positive for headaches. Negative for dizziness, focal weakness, seizures, syncope, facial asymmetry, weakness, light-headedness, numbness and paresthesias.  All other systems reviewed and are negative.     Allergies  Compazine; Reglan; Sulfamethoxazole; Biaxin; Chlorhexidine; Codeine; Hydrocodone; Macrobid; Promethazine; and Sulfa antibiotics  Home Medications   Prior to Admission medications   Medication Sig Start Date End Date Taking? Authorizing Provider  acetaminophen (TYLENOL) 500 MG tablet Take 1,000 mg by mouth every 6 (six) hours as needed for headache.    Historical Provider, MD  albuterol (PROVENTIL HFA;VENTOLIN HFA) 108 (90 BASE) MCG/ACT inhaler Inhale 2 puffs into the lungs every 6 (six) hours as needed for wheezing or shortness of breath. 07/25/15   Jearld Fenton, NP  ALPRAZolam Duanne Moron) 0.25 MG tablet TAKE 2 TABLETS BY MOUTH EVERY DAY AS NEEDED 03/06/16   Jinny Sanders, MD  azaTHIOprine (IMURAN) 50 MG tablet Take 150 mg daily 01/23/16   Manus Gunning, MD  butalbital-acetaminophen-caffeine (FIORICET, ESGIC) [phone] MG tablet TAKE 2 TABLETS EVERY 6 HOURS AS NEEDED 04/16/16   Zenovia Jarred, DO  Cetirizine HCl (ZYRTEC ALLERGY PO) Take 1 tablet by mouth daily.     Historical Provider, MD  cyanocobalamin (,VITAMIN B-12,) 1000 MCG/ML injection Inject 1,000 mcg into the muscle every 21 ( twenty-one) days.    Historical Provider, MD  cyclobenzaprine (FLEXERIL) 10 MG tablet Take 1 tablet (10 mg total) by  mouth at bedtime as needed for muscle spasms. 10/14/15   Amy Cletis Athens, MD  dexlansoprazole (DEXILANT) 60 MG capsule Take 1 capsule (60 mg total) by mouth daily. 09/08/15   Amy Cletis Athens, MD  dicyclomine (BENTYL) 20 MG tablet Take 20 mg by mouth 3 (three) times daily as needed for spasms.     Historical Provider, MD  HYDROmorphone (DILAUDID) 2 MG tablet Take 1/2 to 1 tab every 4-6 hours as needed for pain. 10/26/15   Amy S [place], PA-C  lactobacillus acidophilus (BACID) TABS tablet Take 2 tablets by mouth 3 (three) times daily.    Historical Provider, MD  ondansetron (ZOFRAN) 4 MG tablet Take 1 tablet (4 mg total) by mouth every 8 (eight) hours as needed for nausea or vomiting. 04/16/16   Zenovia Jarred, DO  Prenatal Vit-Fe Fumarate-FA (PRENATAL PO) Take 2 tablets by mouth every evening. Prenatal gummy vitamin.    Historical Provider, MD  ranitidine (ZANTAC) 150 MG tablet Take 1 tablet (150 mg  total) by mouth every morning. 01/17/16   Manus Gunning, MD  sertraline (ZOLOFT) 100 MG tablet Take 1 tablet (100 mg total) by mouth daily. 12/28/15   Amy Cletis Athens, MD  traMADol (ULTRAM) 50 MG tablet Take 50 mg by mouth every 6 (six) hours as needed for moderate pain or severe pain.    Historical Provider, MD  zolpidem (AMBIEN) 10 MG tablet TAKE ONE TABLET BY MOUTH EVERY NIGHT AT BEDTIME 03/26/16   Amy E Bedsole, MD   BP 109/70 mmHg  Pulse 108  Temp(Src) 98.7 F (37.1 C) (Oral)  Resp 16  Ht 5' 4"  (1.626 m)  Wt 64.864 kg  BMI 24.53 kg/m2  SpO2 100%  LMP 04/11/2016 Physical Exam  Constitutional: She is oriented to person, place, and time. She appears well-developed and well-nourished. No distress.  HENT:  Head: Normocephalic and atraumatic.  Right Ear: External ear normal.  Left Ear: External ear normal.  Nose: Nose normal.  Mouth/Throat: Oropharynx is clear and moist.  Eyes: Conjunctivae and EOM are normal. Pupils are equal, round, and reactive to light.  Neck: Normal range of motion and full  passive range of motion without pain. Neck supple. No Brudzinski's sign and no Kernig's sign noted.  Cardiovascular: Normal rate, regular rhythm, normal heart sounds and intact distal pulses.   Pulmonary/Chest: Effort normal and breath sounds normal.  Abdominal: Soft. She exhibits no distension. There is no tenderness.  Musculoskeletal: She exhibits no edema or tenderness.  No tenderness in her C/T/L spine  Neurological: She is alert and oriented to person, place, and time. She has normal strength. No cranial nerve deficit or sensory deficit. She displays a negative Romberg sign. Coordination and gait normal. GCS eye subscore is 4. GCS verbal subscore is 5. GCS motor subscore is 6.  Intact finger to nose and heel to shin  Skin: Skin is warm and dry. No rash noted. She is not diaphoretic.  Nursing note and vitals reviewed.   ED Course  Procedures (including critical care time) Labs Review Labs Reviewed - No data to display  Imaging Review No results found. I have personally reviewed and evaluated these images and lab results as part of my medical decision-making.   EKG Interpretation None      MDM  41 y.o. female with a hx of migraine headaches presents to the ED noting a headache similar to prior, unalleviated by her home medications since yesterday. On exam she is neurologically intact, as above. No meningeal signs. No history of, nor evidence of trauma. She was given a migraine cocktail consisting of haldol, decadron, magnesium, benadryl and she had significant improvement in her sx. The migraine cocktail was modified because the patient is allergic to both compazine and reglan. Upon reassessment she stated that she felt significantly better and wished to be discharged home. Feel that this was a migraine headache, similar to that which she has had in the past. Do not feel that laboratory of imaging evaluation is necessary at this time. She was recommended to follow up with her PCP and  Neurologist to further discuss her sx and to continue to take her home migraine medications, as needed. She stated both understanding and agreement with this plan and was discharged home in good condition,  Final diagnoses:  Migraine without aura and without status migrainosus, not intractable       Zenovia Jarred, DO 04/17/16 [place], MD 04/25/16 1237

## 2016-04-16 NOTE — ED Notes (Signed)
Pt presents with HA since yesterday, hx of migraines, states she took her usual migraine meds without relief. Pt c/o N/V

## 2016-04-16 NOTE — Discharge Instructions (Signed)

## 2016-04-17 ENCOUNTER — Encounter (HOSPITAL_COMMUNITY): Payer: Self-pay | Admitting: Emergency Medicine

## 2016-04-17 ENCOUNTER — Telehealth: Payer: Self-pay | Admitting: Family Medicine

## 2016-04-17 ENCOUNTER — Inpatient Hospital Stay (HOSPITAL_COMMUNITY)
Admission: EM | Admit: 2016-04-17 | Discharge: 2016-04-20 | DRG: 916 | Disposition: A | Discharge status: Home / Self Care | Payer: BC Managed Care – PPO | Attending: Internal Medicine | Admitting: Internal Medicine

## 2016-04-17 DIAGNOSIS — T8069XA Other serum reaction due to other serum, initial encounter: Secondary | ICD-10-CM | POA: Diagnosis present

## 2016-04-17 DIAGNOSIS — Z888 Allergy status to other drugs, medicaments and biological substances status: Secondary | ICD-10-CM

## 2016-04-17 DIAGNOSIS — D849 Immunodeficiency, unspecified: Secondary | ICD-10-CM | POA: Diagnosis present

## 2016-04-17 DIAGNOSIS — Z800 Family history of malignant neoplasm of digestive organs: Secondary | ICD-10-CM

## 2016-04-17 DIAGNOSIS — R6510 Systemic inflammatory response syndrome (SIRS) of non-infectious origin without acute organ dysfunction: Secondary | ICD-10-CM

## 2016-04-17 DIAGNOSIS — D899 Disorder involving the immune mechanism, unspecified: Secondary | ICD-10-CM

## 2016-04-17 DIAGNOSIS — M545 Low back pain, unspecified: Secondary | ICD-10-CM

## 2016-04-17 DIAGNOSIS — A419 Sepsis, unspecified organism: Secondary | ICD-10-CM | POA: Diagnosis present

## 2016-04-17 DIAGNOSIS — Z882 Allergy status to sulfonamides status: Secondary | ICD-10-CM

## 2016-04-17 DIAGNOSIS — Z7951 Long term (current) use of inhaled steroids: Secondary | ICD-10-CM

## 2016-04-17 DIAGNOSIS — F325 Major depressive disorder, single episode, in full remission: Secondary | ICD-10-CM | POA: Diagnosis present

## 2016-04-17 DIAGNOSIS — F331 Major depressive disorder, recurrent, moderate: Secondary | ICD-10-CM | POA: Diagnosis present

## 2016-04-17 DIAGNOSIS — Z79899 Other long term (current) drug therapy: Secondary | ICD-10-CM

## 2016-04-17 DIAGNOSIS — T398X5A Adverse effect of other nonopioid analgesics and antipyretics, not elsewhere classified, initial encounter: Secondary | ICD-10-CM | POA: Diagnosis present

## 2016-04-17 DIAGNOSIS — Z8249 Family history of ischemic heart disease and other diseases of the circulatory system: Secondary | ICD-10-CM

## 2016-04-17 DIAGNOSIS — R51 Headache: Secondary | ICD-10-CM

## 2016-04-17 DIAGNOSIS — Y92239 Unspecified place in hospital as the place of occurrence of the external cause: Secondary | ICD-10-CM | POA: Diagnosis not present

## 2016-04-17 DIAGNOSIS — R739 Hyperglycemia, unspecified: Secondary | ICD-10-CM | POA: Diagnosis present

## 2016-04-17 DIAGNOSIS — Z885 Allergy status to narcotic agent status: Secondary | ICD-10-CM

## 2016-04-17 DIAGNOSIS — G43909 Migraine, unspecified, not intractable, without status migrainosus: Secondary | ICD-10-CM | POA: Diagnosis present

## 2016-04-17 DIAGNOSIS — K5090 Crohn's disease, unspecified, without complications: Secondary | ICD-10-CM | POA: Diagnosis present

## 2016-04-17 DIAGNOSIS — J4540 Moderate persistent asthma, uncomplicated: Secondary | ICD-10-CM | POA: Diagnosis present

## 2016-04-17 DIAGNOSIS — Z8261 Family history of arthritis: Secondary | ICD-10-CM

## 2016-04-17 DIAGNOSIS — Z86711 Personal history of pulmonary embolism: Secondary | ICD-10-CM

## 2016-04-17 DIAGNOSIS — R509 Fever, unspecified: Secondary | ICD-10-CM

## 2016-04-17 DIAGNOSIS — R519 Headache, unspecified: Secondary | ICD-10-CM

## 2016-04-17 DIAGNOSIS — Z833 Family history of diabetes mellitus: Secondary | ICD-10-CM

## 2016-04-17 DIAGNOSIS — F411 Generalized anxiety disorder: Secondary | ICD-10-CM | POA: Diagnosis present

## 2016-04-17 DIAGNOSIS — G970 Cerebrospinal fluid leak from spinal puncture: Secondary | ICD-10-CM | POA: Diagnosis not present

## 2016-04-17 DIAGNOSIS — K219 Gastro-esophageal reflux disease without esophagitis: Secondary | ICD-10-CM | POA: Diagnosis present

## 2016-04-17 DIAGNOSIS — Y838 Other surgical procedures as the cause of abnormal reaction of the patient, or of later complication, without mention of misadventure at the time of the procedure: Secondary | ICD-10-CM | POA: Diagnosis not present

## 2016-04-17 MED ORDER — CYCLOBENZAPRINE HCL 10 MG PO TABS: 10.0000 mg | ORAL_TABLET | Freq: Every evening | ORAL | Status: DC | PRN

## 2016-04-17 MED ORDER — DEXTROSE 5 % IV SOLN
2.0000 g | Freq: Three times a day (TID) | INTRAVENOUS | Status: DC
  Administered 2016-04-18 – 2016-04-19 (×6): 2 g via INTRAVENOUS
  Filled 2016-04-17 (×8): qty 2

## 2016-04-17 MED ORDER — SODIUM CHLORIDE 0.9 % IV SOLN
1000.0000 mL | INTRAVENOUS | Status: DC
  Administered 2016-04-18: 1000 mL via INTRAVENOUS

## 2016-04-17 MED ORDER — VANCOMYCIN HCL IN DEXTROSE 1-5 GM/200ML-% IV SOLN
1000.0000 mg | Freq: Once | INTRAVENOUS | Status: AC
  Administered 2016-04-18: 1000 mg via INTRAVENOUS
  Filled 2016-04-17: qty 200

## 2016-04-17 MED ORDER — PIPERACILLIN-TAZOBACTAM 3.375 G IVPB 30 MIN: 3.3750 g | Freq: Once | INTRAVENOUS | Status: DC

## 2016-04-17 MED ORDER — MORPHINE SULFATE (PF) 4 MG/ML IV SOLN
4.0000 mg | Freq: Once | INTRAVENOUS | Status: AC
  Administered 2016-04-18: 4 mg via INTRAVENOUS
  Filled 2016-04-17: qty 1

## 2016-04-17 MED ORDER — SODIUM CHLORIDE 0.9 % IV BOLUS (SEPSIS)
1000.0000 mL | INTRAVENOUS | Status: AC
  Administered 2016-04-18 (×2): 1000 mL via INTRAVENOUS

## 2016-04-17 MED ORDER — ONDANSETRON HCL 4 MG/2ML IJ SOLN
4.0000 mg | Freq: Once | INTRAMUSCULAR | Status: AC
  Administered 2016-04-18: 4 mg via INTRAVENOUS
  Filled 2016-04-17: qty 2

## 2016-04-17 NOTE — ED Notes (Signed)
Called [facility] to activate Code Sepsis

## 2016-04-17 NOTE — Telephone Encounter (Signed)
Spoke with [place].  She states the tingling in her arm and legs are gone but she is still having the jaw tightness.  She states the headache is about the same but not as bad as last night.  Please advise.

## 2016-04-17 NOTE — Telephone Encounter (Signed)
What has helped with migraine in past? What has she tried other than ER meds?

## 2016-04-17 NOTE — Telephone Encounter (Signed)
Patient Name: [name]  DOB: November 16, 1976    Initial Comment Caller states yesterday she went to the ER and was given an IV cocktail for her migraine- she then had jaw tightness and tingling in legs and arms. Still has a headache and jaw tightness.--Please call both numbers. she is a Physicist, medical  Nurse: Raphael Gibney, RN, Vanita Ingles Date/Time (Eastern Time): 04/17/2016 11:46:37 AM  Confirm and document reason for call. If symptomatic, describe symptoms. You must click the next button to save text entered. ---Caller states she went to the ER yesterday for migraine. She was given Zofran, Decadron,benadryl, magnesium, in IV cocktail. She is still having migraine headache and tightness in both jaws. Fiorcet helps only a little. Neck is tight but she can move it.  Has the patient traveled out of the country within the last 30 days? ---Not Applicable  Does the patient have any new or worsening symptoms? ---Yes  Will a triage be completed? ---Yes  Related visit to physician within the last 2 weeks? ---Yes  Does the PT have any chronic conditions? (i.e. diabetes, asthma, etc.) ---Yes  List chronic conditions. ---migraines  Is the patient pregnant or possibly pregnant? (Ask all females between the ages of 54-55) ---No  Is this a behavioral health or substance abuse call? ---No     Guidelines    Guideline Title Affirmed Question Affirmed Notes  Headache [1] SEVERE headache (e.g., excruciating) AND [2] not improved after 2 hours of pain medicine    Final Disposition User   See Physician within 4 Hours (or PCP triage) Raphael Gibney, RN, [place]    Comments  Called primary number and left message. Will try secondary number  No appts are available at [facility] within 4 hrs. Pt does not want to go to urgent care or to another office. Please call pt back regarding appt.   Referrals  GO TO FACILITY REFUSED   Disagree/Comply: Disagree  Disagree/Comply Reason: Disagree with instructions

## 2016-04-17 NOTE — ED Provider Notes (Signed)
By signing my name below, I, [name], attest that this documentation has been prepared under the direction and in the presence of Girard, DO . Electronically Signed: [name], Scribe. 04/17/2016. 12:46 AM.   TIME SEEN: 11:14 PM  CHIEF COMPLAINT: Chief Complaint  Patient presents with  . Generalized Body Aches  . Joint Pain    HPI:  HPI Comments:  [name] is a 40 y.o. female with a history of PE off Lovenox for the past 6 months, RA and crohn's disease currently being treated with a imuran and Remicade, who presents to the Emergency Department complaining of gradually worsening jaw pain which began yesterday. She notes the pain started on the right, spread to the left and radiates to her neck. Since the pain began she has been experiencing joint pain throughout. Pain is worse in her bilateral shoulders, elbows and hips. Pt reports increased pain with movement of her neck.  She was seen in ED on 04/16/16 for migraine, states she was given a migraine cocktail and discharged home.  Pt states the pain she experienced with that HA was different in that the pain was diffusely throughout her head instead of one sided. She notes very mild HA at this time, states she took Fioricet earlier today with moderate relief. Pt also notes h/o bacterial meningitis in 2004; states she had a HA and fever with that episode but does not remember specific details. States that this feels different. States she has never felt like this before. Pt also notes lower back pain x a few weeks; was told by PCP episode may be a side effect of her crohn's, possible sacroiliitis. Denies that she has had any injury to her back. No numbness, tingling or focal weakness. No bowel or bladder incontinence.  She also denies CP, difficulty swallowing, and SOB. No cough, vomiting or diarrhea. No current abdominal pain. No dysuria. No rash. 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 Pt had a flu shot  this season. No recent travel outside of country or recent tick bites.  Pt is 10 months postpartum and is still breastfeeding.   PCP [facility]- [facility]  ROS: See HPI Constitutional:  fever  Eyes: no drainage  ENT: no runny nose   Cardiovascular:  no chest pain  Resp: no SOB  GI: no vomiting GU: no dysuria Integumentary: no rash  Allergy: no hives  Musculoskeletal: no leg swelling  Neurological: no slurred speech ROS otherwise negative  PAST MEDICAL HISTORY/PAST SURGICAL HISTORY:  Past Medical History  Diagnosis Date  . Crohn disease ([place]) 2000  . Asthma   . Endometriosis   . Dysautonomia     recurrent syncope s/p ILR by Dr Doreatha Lew  . Palpitations   . Anxiety   . GERD (gastroesophageal reflux disease)   . Vertigo   . Anginal pain ([place]) only on 09/22/2014  . H/O hiatal hernia   . Chest pain     a. s/p intermediate risk nuclear stress test on 09/23/14 and normal LHC on 09/24/14   . Chronic nausea   . Migraine   . Pulmonary embolism affecting pregnancy     MEDICATIONS:  Prior to Admission medications   Medication Sig Start Date End Date Taking? Authorizing Provider  acetaminophen (TYLENOL) 500 MG tablet Take 1,000 mg by mouth every 6 (six) hours as needed for headache.    Historical Provider, MD  albuterol (PROVENTIL HFA;VENTOLIN HFA) 108 (90 BASE) MCG/ACT inhaler Inhale 2  puffs into the lungs every 6 (six) hours as needed for wheezing or shortness of breath. 07/25/15   Jearld Fenton, NP  ALPRAZolam Duanne Moron) 0.25 MG tablet TAKE 2 TABLETS BY MOUTH EVERY DAY AS NEEDED 03/06/16   Jinny Sanders, MD  azaTHIOprine (IMURAN) 50 MG tablet Take 150 mg daily 01/23/16   Manus Gunning, MD  butalbital-acetaminophen-caffeine (FIORICET, ESGIC) [phone] MG tablet TAKE 2 TABLETS EVERY 6 HOURS AS NEEDED 04/16/16   Zenovia Jarred, DO  Cetirizine HCl (ZYRTEC ALLERGY PO) Take 1 tablet by mouth daily.     Historical Provider, MD  cyanocobalamin (,VITAMIN B-12,) 1000 MCG/ML  injection Inject 1,000 mcg into the muscle every 21 ( twenty-one) days.    Historical Provider, MD  cyclobenzaprine (FLEXERIL) 10 MG tablet Take 1 tablet (10 mg total) by mouth at bedtime as needed for muscle spasms. 04/17/16   Amy Cletis Athens, MD  dexlansoprazole (DEXILANT) 60 MG capsule Take 1 capsule (60 mg total) by mouth daily. 09/08/15   Amy Cletis Athens, MD  dicyclomine (BENTYL) 20 MG tablet Take 20 mg by mouth 3 (three) times daily as needed for spasms.     Historical Provider, MD  HYDROmorphone (DILAUDID) 2 MG tablet Take 1/2 to 1 tab every 4-6 hours as needed for pain. 10/26/15   Amy S Esterwood, PA-C  lactobacillus acidophilus (BACID) TABS tablet Take 2 tablets by mouth 3 (three) times daily.    Historical Provider, MD  ondansetron (ZOFRAN) 4 MG tablet Take 1 tablet (4 mg total) by mouth every 8 (eight) hours as needed for nausea or vomiting. 04/16/16   Zenovia Jarred, DO  Prenatal Vit-Fe Fumarate-FA (PRENATAL PO) Take 2 tablets by mouth every evening. Prenatal gummy vitamin.    Historical Provider, MD  ranitidine (ZANTAC) 150 MG tablet Take 1 tablet (150 mg total) by mouth every morning. 01/17/16   Manus Gunning, MD  sertraline (ZOLOFT) 100 MG tablet Take 1 tablet (100 mg total) by mouth daily. 12/28/15   Amy Cletis Athens, MD  traMADol (ULTRAM) 50 MG tablet Take 50 mg by mouth every 6 (six) hours as needed for moderate pain or severe pain.    Historical Provider, MD  zolpidem (AMBIEN) 10 MG tablet TAKE ONE TABLET BY MOUTH EVERY NIGHT AT BEDTIME 03/26/16   Amy Cletis Athens, MD    ALLERGIES:  Allergies  Allergen Reactions  . Compazine [Prochlorperazine Edisylate] Anaphylaxis  . Reglan [Metoclopramide] Anaphylaxis and Other (See Comments)    Other reaction(s): GI Upset (intolerance) Intensifies her Chron's  . Sulfamethoxazole Rash and Other (See Comments)    Internal bleeding and kidney infection  . Biaxin [Clarithromycin] Other (See Comments)    Cant take due to Chrons disease  .  Chlorhexidine Other (See Comments)    Blisters after using a couple of times.   . Codeine Nausea And Vomiting and Other (See Comments)    Dilaudid and demerol OK  . Hydrocodone Nausea And Vomiting  . Macrobid [Nitrofurantoin] Nausea And Vomiting    Other reaction(s): GI Upset (intolerance)  . Promethazine Rash and Other (See Comments)    Muscular seizures  . Sulfa Antibiotics Hives and Other (See Comments)    Hematemesis; documented while a young child    SOCIAL HISTORY:  Social History  Substance Use Topics  . Smoking status: Never Smoker   . Smokeless tobacco: Never Used  . Alcohol Use: Yes    FAMILY HISTORY: Family History  Problem Relation Age of Onset  . Hypertension Mother   .  Hyperlipidemia Mother   . Arthritis Mother   . Diabetes Father   . Coronary artery disease Father   . Heart disease Father 24    MI age 62  . Hyperlipidemia Brother   . Hypertension Brother   . Colon cancer Paternal Grandmother     EXAM: BP 125/87 mmHg  Pulse 108  Temp(Src) 101.3 F (38.5 C) (Rectal)  Resp 26  Ht 5' 4"  (1.626 m)  Wt 145 lb (65.772 kg)  BMI 24.88 kg/m2  SpO2 100%  LMP 04/11/2016 CONSTITUTIONAL: Alert and oriented and responds appropriately to questions. Patient appears very uncomfortable, febrile, nontoxic appearing HEAD: Normocephalic EYES: Conjunctivae clear, PERRL ENT: normal nose; no rhinorrhea; moist mucous membranes; patient has trismus and is unable to open her mouth fully and therefore I'm not able to visualize her posterior oropharynx. She does have normal phonation. No drooling. Tender to palpation over the bilateral TMJ without erythema, swelling or warmth. No Ludwig's angina. No angioedema. NECK: Supple, patient has nuchal rigidity, no appreciable lymphadenopathy CARD: Regular and tachycardic; S1 and S2 appreciated; no murmurs, no clicks, no rubs, no gallops BREAST:  No signs of mastitis, nipple discharge RESP: Normal chest excursion without splinting,  patient is tachypneic; breath sounds clear and equal bilaterally; no wheezes, no rhonchi, no rales, no hypoxia or respiratory distress, speaking full sentences ABD/GI: Normal bowel sounds; non-distended; soft, non-tender, no rebound, no guarding, no peritoneal signs BACK:  The back appears normal and is non-tender to palpation, there is no CVA tenderness EXT: Normal ROM in all joints; non-tender to palpation; no edema; normal capillary refill; no cyanosis, no calf tenderness or swelling; no joint effusion, erythema or warmth on exam. Compartments are all soft. 2+ radial and DP pulses bilaterally.    SKIN: Normal color for age and race; warm; no rash NEURO: Moves all extremities equally, sensation to light touch intact diffusely, cranial nerves II through XII intact, when patient sits upright in the bed she becomes very tachycardic and tearful complaining of significant lower back and bilateral hip pain. PSYCH: Patient is tearful and appears uncomfortable. Otherwise main or is appropriate. Grooming normal.   EKG Interpretation  Date/Time:  Tuesday April 17 2016 23:42:07 EDT Ventricular Rate:  109 PR Interval:  160 QRS Duration: 84 QT Interval:  344 QTC Calculation: 463 R Axis:   101 Text Interpretation:  Sinus tachycardia Right axis deviation Baseline wander in lead(s) V6 No significant change since last tracing other than rate is faster Confirmed by WARD,  DO, KRISTEN ([id number]) on 04/17/2016 11:48:37 PM       MEDICAL DECISION MAKING: Patient here with fever while immunosuppressed. Patient is complaining of headache yesterday that was treated with migraine cocktail but is now mostly gone. Then began having jaw pain on the right side that moved to the left side, neck pain although down into her back, bilateral shoulder, elbow and hip pain. She does have nuchal rigidity on exam. Also has trismus which makes it difficult for me to visualize her posterior oropharynx. She is febrile, tachycardic,  tachypneic. She meets SIRS criteria.  We'll give IV fluids, vancomycin, cefepime. Discussed with patient and her husband at bedside that there could be many things causing her to be held today including rheumatoid arthritis flare, influenza, bacteremia, pneumonia, UTI. Also concerned for meningitis. Given complaints of 2 weeks of low back pain, epidural abscess is also known as differential. I feel she will need a large workup and admission to the hospital. I do not feel this time  that we should perform a lumbar puncture until we have had an MRI of her lumbar spine ruling out infection. We'll discuss with infectious disease on-call as well further recommendations.   ED PROGRESS: 12:00 AM Discussed case with Dr. Graylon Good (infectious disease).  She agrees that patient needs imaging of her lumbar spine prior to lumbar puncture. We will expedite this imaging. MRI tech at bedside currently to take patient over. Labs, cultures, urine pending. Cefepime, vancomycin started prior to transport per ID recommendations. She does not feel we need to start any further antibiotics or antiviral therapy at this time until we have a source. We will also obtain a CT of her head, CT of the soft tissues of her neck to evaluate for mass, hydrocephalus prior to lumbar puncture and also to evaluate for deep space neck infection, peritonsillar abscess given I am unable to visualize her posterior oropharynx and she is complaining of significant neck pain and difficulty moving her neck.  2:00 AM  Pt's labs show leukocytosis of 17.7 with left shift. Lactate normal. Urine shows no obvious sign of infection except for a small amount of blood. Chest x-ray clear. MRI of her lumbar spine shows mild degenerative disc bulge and desiccation at L3-L4 without stenosis. Otherwise normal. CT of her head is pending. We have consented at bedside for lumbar puncture.  3:50 AM  Pt's CT head and soft tissue neck are unremarkable. CSF is pending. We had to  perform the lumbar puncture with patient sitting upright and therefore I was unable to obtain a pressure but fluid did seem to be coming out faster than I would've expected. It was clear, nonbloody. Patient is now complaining of headache that is improving with Dilaudid and IV hydration.   Discussed patient's case with hospitalist, Dr. Blaine Hamper.  Recommend admission to inpatient, stepdown bed.  I will place holding orders per their request. Patient and family (if present) updated with plan. Care transferred to hospitalist service.  I reviewed all nursing notes, vitals, pertinent old records, EKGs, labs, imaging (as available).   CRITICAL CARE Performed by: Delice Bison Ward, DO  Total critical care time: 45 minutes  Critical care time was exclusive of separately billable procedures and treating other patients.  Critical care was necessary to treat or prevent imminent or life-threatening deterioration.  Critical care was time spent personally by me on the following activities: development of treatment plan with patient and/or surrogate as well as nursing, discussions with consultants, evaluation of patient's response to treatment, examination of patient, obtaining history from patient or surrogate, ordering and performing treatments and interventions, ordering and review of laboratory studies, ordering and review of radiographic studies, pulse oximetry and re-evaluation of patient's condition.   .LUMBAR PUNCTURE PROCEDURE NOTE Patient identification was confirmed and consent was obtained.  The procedure was performed at 1:55 AM by Rose Bud, DO. Indication: Rule out meningitis Puncture Site: L3-L4 Sterile procedures observed Patient position: Sitting upright Needle size: 18-gauge Anesthetic used (type and amt): 1% lidocaine without epinephrine, 4 mL's Intracranial pressure: Not obtained Amount CSF collected: 10 mL's Color of CSF collected: Clear, nonbloody Site anesthetized, puncture made at  indicated site, CSF collected and sent for further lab testing (see lab order entry).  Pt tolerated procedure well without complications.  Instructions for care discussed verbally and pt provided with additional written instructions for homecare and f/u.   I personally performed the services described in this documentation, which was scribed in my presence. The recorded information has been reviewed  and is accurate.    [place], DO 04/18/16 [phone]

## 2016-04-17 NOTE — Telephone Encounter (Signed)
Gilbert notified as instructed by telephone.  She ask that we send in a refill on her flexeril to [facility].  She states she is having no tongue swelling or difficulty swallowing.  She states she just freaked out when her jaw started tightening up because when she had a infusion back in 2004 she got bacterial meningitis but states with that she ran a fever.  She states she has not had any fever with this.  Will call tomorrow for office visit if not improving with flexeril.

## 2016-04-17 NOTE — ED Notes (Signed)
Pt. reports generalized body aches , joints pain and stiffness onset this morning , denies injury , no fever or chills.

## 2016-04-17 NOTE — Telephone Encounter (Signed)
Likely med SE. Call pt to get update.

## 2016-04-17 NOTE — Telephone Encounter (Signed)
I am not sure what is causing jaw tightness other than MED SE from ER. We could try muscle relaxant like her flexeril or even alprazolam 1-2 tabs x 1 (she has both of these).  Make sure no tounge swelling and no difficulty breathing. If not improving she should be seen in office tomorrow.

## 2016-04-17 NOTE — Telephone Encounter (Signed)
[name] states she takes Archivist for her migraines.  She took Fioricet at 8 am this morning and it dulled her headache but her jaw keeps getting tighter and it is starting to scare her.  She states she can hardly open her mouth.  She also states that she had a Remicade infusion on Good Friday and is schedule for another infusion this Friday.  She states she is also taking Imuran 3 tablets a day.  Please advise.

## 2016-04-18 ENCOUNTER — Emergency Department (HOSPITAL_COMMUNITY): Payer: BC Managed Care – PPO

## 2016-04-18 ENCOUNTER — Telehealth: Payer: Self-pay

## 2016-04-18 ENCOUNTER — Encounter (HOSPITAL_COMMUNITY): Payer: Self-pay | Admitting: *Deleted

## 2016-04-18 DIAGNOSIS — Z882 Allergy status to sulfonamides status: Secondary | ICD-10-CM | POA: Diagnosis not present

## 2016-04-18 DIAGNOSIS — K50019 Crohn's disease of small intestine with unspecified complications: Secondary | ICD-10-CM | POA: Diagnosis not present

## 2016-04-18 DIAGNOSIS — Z8261 Family history of arthritis: Secondary | ICD-10-CM | POA: Diagnosis not present

## 2016-04-18 DIAGNOSIS — G43009 Migraine without aura, not intractable, without status migrainosus: Secondary | ICD-10-CM | POA: Diagnosis not present

## 2016-04-18 DIAGNOSIS — Z86711 Personal history of pulmonary embolism: Secondary | ICD-10-CM | POA: Diagnosis not present

## 2016-04-18 DIAGNOSIS — Y92239 Unspecified place in hospital as the place of occurrence of the external cause: Secondary | ICD-10-CM | POA: Diagnosis not present

## 2016-04-18 DIAGNOSIS — G970 Cerebrospinal fluid leak from spinal puncture: Secondary | ICD-10-CM | POA: Diagnosis not present

## 2016-04-18 DIAGNOSIS — Z885 Allergy status to narcotic agent status: Secondary | ICD-10-CM | POA: Diagnosis not present

## 2016-04-18 DIAGNOSIS — K50919 Crohn's disease, unspecified, with unspecified complications: Secondary | ICD-10-CM

## 2016-04-18 DIAGNOSIS — R509 Fever, unspecified: Secondary | ICD-10-CM | POA: Diagnosis not present

## 2016-04-18 DIAGNOSIS — Z79899 Other long term (current) drug therapy: Secondary | ICD-10-CM | POA: Diagnosis not present

## 2016-04-18 DIAGNOSIS — Z8249 Family history of ischemic heart disease and other diseases of the circulatory system: Secondary | ICD-10-CM | POA: Diagnosis not present

## 2016-04-18 DIAGNOSIS — Z800 Family history of malignant neoplasm of digestive organs: Secondary | ICD-10-CM | POA: Diagnosis not present

## 2016-04-18 DIAGNOSIS — T8069XA Other serum reaction due to other serum, initial encounter: Secondary | ICD-10-CM | POA: Diagnosis present

## 2016-04-18 DIAGNOSIS — Y838 Other surgical procedures as the cause of abnormal reaction of the patient, or of later complication, without mention of misadventure at the time of the procedure: Secondary | ICD-10-CM | POA: Diagnosis not present

## 2016-04-18 DIAGNOSIS — F411 Generalized anxiety disorder: Secondary | ICD-10-CM | POA: Diagnosis present

## 2016-04-18 DIAGNOSIS — R739 Hyperglycemia, unspecified: Secondary | ICD-10-CM | POA: Diagnosis present

## 2016-04-18 DIAGNOSIS — J4540 Moderate persistent asthma, uncomplicated: Secondary | ICD-10-CM | POA: Diagnosis present

## 2016-04-18 DIAGNOSIS — K5090 Crohn's disease, unspecified, without complications: Secondary | ICD-10-CM | POA: Diagnosis present

## 2016-04-18 DIAGNOSIS — R6510 Systemic inflammatory response syndrome (SIRS) of non-infectious origin without acute organ dysfunction: Secondary | ICD-10-CM

## 2016-04-18 DIAGNOSIS — Z888 Allergy status to other drugs, medicaments and biological substances status: Secondary | ICD-10-CM | POA: Diagnosis not present

## 2016-04-18 DIAGNOSIS — K219 Gastro-esophageal reflux disease without esophagitis: Secondary | ICD-10-CM

## 2016-04-18 DIAGNOSIS — G43909 Migraine, unspecified, not intractable, without status migrainosus: Secondary | ICD-10-CM | POA: Diagnosis not present

## 2016-04-18 DIAGNOSIS — Z833 Family history of diabetes mellitus: Secondary | ICD-10-CM | POA: Diagnosis not present

## 2016-04-18 DIAGNOSIS — F331 Major depressive disorder, recurrent, moderate: Secondary | ICD-10-CM | POA: Diagnosis present

## 2016-04-18 DIAGNOSIS — K50012 Crohn's disease of small intestine with intestinal obstruction: Secondary | ICD-10-CM | POA: Diagnosis not present

## 2016-04-18 DIAGNOSIS — A419 Sepsis, unspecified organism: Secondary | ICD-10-CM

## 2016-04-18 DIAGNOSIS — Z7951 Long term (current) use of inhaled steroids: Secondary | ICD-10-CM | POA: Diagnosis not present

## 2016-04-18 DIAGNOSIS — D899 Disorder involving the immune mechanism, unspecified: Secondary | ICD-10-CM | POA: Diagnosis not present

## 2016-04-18 DIAGNOSIS — M545 Low back pain: Secondary | ICD-10-CM | POA: Diagnosis present

## 2016-04-18 DIAGNOSIS — T398X5A Adverse effect of other nonopioid analgesics and antipyretics, not elsewhere classified, initial encounter: Secondary | ICD-10-CM | POA: Diagnosis present

## 2016-04-18 LAB — CBC WITH DIFFERENTIAL/PLATELET
Basophils Absolute: 0 10*3/uL (ref 0.0–0.1)
Basophils Relative: 0 %
EOS PCT: 1 %
Eosinophils Absolute: 0.1 10*3/uL (ref 0.0–0.7)
HCT: 37.9 % (ref 36.0–46.0)
Hemoglobin: 12.1 g/dL (ref 12.0–15.0)
LYMPHS ABS: 2.5 10*3/uL (ref 0.7–4.0)
LYMPHS PCT: 14 %
MCH: 28.2 pg (ref 26.0–34.0)
MCHC: 31.9 g/dL (ref 30.0–36.0)
MCV: 88.3 fL (ref 78.0–100.0)
MONO ABS: 0.7 10*3/uL (ref 0.1–1.0)
Monocytes Relative: 4 %
Neutro Abs: 14.4 10*3/uL — ABNORMAL HIGH (ref 1.7–7.7)
Neutrophils Relative %: 81 %
PLATELETS: 347 10*3/uL (ref 150–400)
RBC: 4.29 MIL/uL (ref 3.87–5.11)
RDW: 16.7 % — AB (ref 11.5–15.5)
WBC: 17.7 10*3/uL — ABNORMAL HIGH (ref 4.0–10.5)

## 2016-04-18 LAB — COMPREHENSIVE METABOLIC PANEL
ALBUMIN: 3.6 g/dL (ref 3.5–5.0)
ALT: 14 U/L (ref 14–54)
AST: 19 U/L (ref 15–41)
Alkaline Phosphatase: 67 U/L (ref 38–126)
Anion gap: 11 (ref 5–15)
BUN: 12 mg/dL (ref 6–20)
CHLORIDE: 110 mmol/L (ref 101–111)
CO2: 20 mmol/L — ABNORMAL LOW (ref 22–32)
Calcium: 8.7 mg/dL — ABNORMAL LOW (ref 8.9–10.3)
Creatinine, Ser: 0.83 mg/dL (ref 0.44–1.00)
GFR calc Af Amer: >60 mL/min (ref >60)
GFR calc non Af Amer: >60 mL/min (ref >60)
GLUCOSE: 117 mg/dL — AB (ref 65–99)
POTASSIUM: 3.7 mmol/L (ref 3.5–5.1)
Sodium: 141 mmol/L (ref 135–145)
Total Bilirubin: 0.3 mg/dL (ref 0.3–1.2)
Total Protein: 6.7 g/dL (ref 6.5–8.1)

## 2016-04-18 LAB — INFLUENZA PANEL BY PCR (TYPE A & B)
H1N1 flu by pcr: NOT DETECTED
INFLBPCR: NEGATIVE
Influenza A By PCR: NEGATIVE

## 2016-04-18 LAB — CBC
HEMATOCRIT: 32.5 % — AB (ref 36.0–46.0)
HEMOGLOBIN: 10.5 g/dL — AB (ref 12.0–15.0)
MCH: 28.4 pg (ref 26.0–34.0)
MCHC: 32.3 g/dL (ref 30.0–36.0)
MCV: 87.8 fL (ref 78.0–100.0)
Platelets: 283 10*3/uL (ref 150–400)
RBC: 3.7 MIL/uL — AB (ref 3.87–5.11)
RDW: 16.6 % — ABNORMAL HIGH (ref 11.5–15.5)
WBC: 14.9 10*3/uL — ABNORMAL HIGH (ref 4.0–10.5)

## 2016-04-18 LAB — URINE MICROSCOPIC-ADD ON

## 2016-04-18 LAB — BASIC METABOLIC PANEL
ANION GAP: 9 (ref 5–15)
BUN: 8 mg/dL (ref 6–20)
CALCIUM: 7.4 mg/dL — AB (ref 8.9–10.3)
CO2: 18 mmol/L — AB (ref 22–32)
Chloride: 112 mmol/L — ABNORMAL HIGH (ref 101–111)
Creatinine, Ser: 0.6 mg/dL (ref 0.44–1.00)
GFR calc non Af Amer: >60 mL/min (ref >60)
GLUCOSE: 90 mg/dL (ref 65–99)
POTASSIUM: 3.3 mmol/L — AB (ref 3.5–5.1)
Sodium: 139 mmol/L (ref 135–145)

## 2016-04-18 LAB — CSF CELL COUNT WITH DIFFERENTIAL
RBC Count, CSF: 1 /mm3 — ABNORMAL HIGH
RBC Count, CSF: 5 /mm3 — ABNORMAL HIGH
Tube #: 1
Tube #: 4
WBC CSF: 1 /mm3 (ref 0–5)
WBC CSF: 1 /mm3 (ref 0–5)

## 2016-04-18 LAB — PROCALCITONIN: Procalcitonin: <0.1 ng/mL

## 2016-04-18 LAB — URINALYSIS, ROUTINE W REFLEX MICROSCOPIC
Bilirubin Urine: NEGATIVE
GLUCOSE, UA: NEGATIVE mg/dL
KETONES UR: NEGATIVE mg/dL
Leukocytes, UA: NEGATIVE
Nitrite: NEGATIVE
PROTEIN: NEGATIVE mg/dL
Specific Gravity, Urine: 1.031 — ABNORMAL HIGH (ref 1.005–1.030)
pH: 6 (ref 5.0–8.0)

## 2016-04-18 LAB — PROTEIN AND GLUCOSE, CSF
Glucose, CSF: 76 mg/dL — ABNORMAL HIGH (ref 40–70)
TOTAL PROTEIN, CSF: 27 mg/dL (ref 15–45)

## 2016-04-18 LAB — I-STAT CG4 LACTIC ACID, ED: LACTIC ACID, VENOUS: 1.8 mmol/L (ref 0.5–2.0)

## 2016-04-18 LAB — PROTIME-INR
INR: 1.1 (ref 0.00–1.49)
Prothrombin Time: 14.4 seconds (ref 11.6–15.2)

## 2016-04-18 LAB — SEDIMENTATION RATE: SED RATE: 35 mm/h — AB (ref 0–22)

## 2016-04-18 LAB — C-REACTIVE PROTEIN: CRP: 6.1 mg/dL — AB (ref <1.0)

## 2016-04-18 LAB — PREGNANCY, URINE: Preg Test, Ur: NEGATIVE

## 2016-04-18 LAB — LACTIC ACID, PLASMA
LACTIC ACID, VENOUS: 1.1 mmol/L (ref 0.5–2.0)
Lactic Acid, Venous: 0.8 mmol/L (ref 0.5–2.0)

## 2016-04-18 LAB — APTT: APTT: 28 s (ref 24–37)

## 2016-04-18 LAB — TROPONIN I: Troponin I: <0.03 ng/mL (ref <0.031)

## 2016-04-18 LAB — GLUCOSE, CAPILLARY: Glucose-Capillary: 114 mg/dL — ABNORMAL HIGH (ref 65–99)

## 2016-04-18 MED ORDER — FAMOTIDINE 20 MG PO TABS
20.0000 mg | ORAL_TABLET | Freq: Every day | ORAL | Status: DC
  Administered 2016-04-18 – 2016-04-20 (×3): 20 mg via ORAL
  Filled 2016-04-18 (×3): qty 1

## 2016-04-18 MED ORDER — ALBUTEROL SULFATE (2.5 MG/3ML) 0.083% IN NEBU: 3.0000 mL | INHALATION_SOLUTION | Freq: Four times a day (QID) | RESPIRATORY_TRACT | Status: DC | PRN

## 2016-04-18 MED ORDER — LORATADINE 10 MG PO TABS
10.0000 mg | ORAL_TABLET | Freq: Every day | ORAL | Status: DC
  Administered 2016-04-18 – 2016-04-20 (×3): 10 mg via ORAL
  Filled 2016-04-18 (×3): qty 1

## 2016-04-18 MED ORDER — ZOLPIDEM TARTRATE 5 MG PO TABS
5.0000 mg | ORAL_TABLET | Freq: Every day | ORAL | Status: DC
  Administered 2016-04-18 – 2016-04-19 (×2): 5 mg via ORAL
  Filled 2016-04-18 (×2): qty 1

## 2016-04-18 MED ORDER — AZATHIOPRINE 50 MG PO TABS
150.0000 mg | ORAL_TABLET | Freq: Every day | ORAL | Status: DC
  Administered 2016-04-18 – 2016-04-20 (×3): 150 mg via ORAL
  Filled 2016-04-18 (×3): qty 3

## 2016-04-18 MED ORDER — ONDANSETRON HCL 4 MG/2ML IJ SOLN
4.0000 mg | Freq: Three times a day (TID) | INTRAMUSCULAR | Status: DC | PRN
  Administered 2016-04-18: 4 mg via INTRAVENOUS
  Filled 2016-04-18: qty 2

## 2016-04-18 MED ORDER — CYANOCOBALAMIN 1000 MCG/ML IJ SOLN: 1000.0000 ug | INTRAMUSCULAR | Status: DC

## 2016-04-18 MED ORDER — ACETAMINOPHEN 500 MG PO TABS
1000.0000 mg | ORAL_TABLET | Freq: Once | ORAL | Status: AC
  Administered 2016-04-18: 1000 mg via ORAL
  Filled 2016-04-18: qty 2

## 2016-04-18 MED ORDER — SODIUM CHLORIDE 0.9% FLUSH
3.0000 mL | Freq: Two times a day (BID) | INTRAVENOUS | Status: DC
  Administered 2016-04-18 – 2016-04-20 (×3): 3 mL via INTRAVENOUS

## 2016-04-18 MED ORDER — BUTALBITAL-APAP-CAFFEINE 50-325-40 MG PO TABS
1.0000 | ORAL_TABLET | Freq: Four times a day (QID) | ORAL | Status: DC | PRN
  Administered 2016-04-19: 1 via ORAL
  Filled 2016-04-18: qty 1

## 2016-04-18 MED ORDER — KETOROLAC TROMETHAMINE 30 MG/ML IJ SOLN
30.0000 mg | Freq: Once | INTRAMUSCULAR | Status: AC
  Administered 2016-04-18: 30 mg via INTRAVENOUS
  Filled 2016-04-18: qty 1

## 2016-04-18 MED ORDER — ALPRAZOLAM 0.5 MG PO TABS: 0.5000 mg | ORAL_TABLET | Freq: Every day | ORAL | Status: DC | PRN

## 2016-04-18 MED ORDER — HYDROMORPHONE HCL 1 MG/ML IJ SOLN
1.0000 mg | Freq: Once | INTRAMUSCULAR | Status: AC
  Administered 2016-04-18: 1 mg via INTRAVENOUS

## 2016-04-18 MED ORDER — SODIUM CHLORIDE 0.9 % IV SOLN
1000.0000 mL | INTRAVENOUS | Status: DC
  Administered 2016-04-18 (×3): 1000 mL via INTRAVENOUS

## 2016-04-18 MED ORDER — RISAQUAD PO CAPS
2.0000 | ORAL_CAPSULE | Freq: Three times a day (TID) | ORAL | Status: DC
  Administered 2016-04-18 – 2016-04-20 (×6): 2 via ORAL
  Filled 2016-04-18 (×6): qty 2

## 2016-04-18 MED ORDER — CYCLOBENZAPRINE HCL 10 MG PO TABS
10.0000 mg | ORAL_TABLET | Freq: Every evening | ORAL | Status: DC | PRN
  Administered 2016-04-18: 10 mg via ORAL
  Filled 2016-04-18: qty 1

## 2016-04-18 MED ORDER — ZOLPIDEM TARTRATE 5 MG PO TABS: 10.0000 mg | ORAL_TABLET | Freq: Every day | ORAL | Status: DC

## 2016-04-18 MED ORDER — IOPAMIDOL (ISOVUE-300) INJECTION 61%
INTRAVENOUS | Status: AC
  Administered 2016-04-18: 75 mL
  Filled 2016-04-18: qty 75

## 2016-04-18 MED ORDER — VANCOMYCIN HCL IN DEXTROSE 750-5 MG/150ML-% IV SOLN
750.0000 mg | Freq: Three times a day (TID) | INTRAVENOUS | Status: DC
  Administered 2016-04-18 – 2016-04-19 (×4): 750 mg via INTRAVENOUS
  Filled 2016-04-18 (×8): qty 150

## 2016-04-18 MED ORDER — GADOBENATE DIMEGLUMINE 529 MG/ML IV SOLN
15.0000 mL | Freq: Once | INTRAVENOUS | Status: AC | PRN
  Administered 2016-04-18: 15 mL via INTRAVENOUS

## 2016-04-18 MED ORDER — LORAZEPAM 2 MG/ML IJ SOLN
1.0000 mg | Freq: Once | INTRAMUSCULAR | Status: AC
  Administered 2016-04-18: 1 mg via INTRAVENOUS

## 2016-04-18 MED ORDER — ACETAMINOPHEN 325 MG PO TABS
650.0000 mg | ORAL_TABLET | Freq: Four times a day (QID) | ORAL | Status: DC | PRN
  Administered 2016-04-18 – 2016-04-19 (×2): 650 mg via ORAL
  Filled 2016-04-18 (×2): qty 2

## 2016-04-18 MED ORDER — HYDROMORPHONE HCL 1 MG/ML IJ SOLN
INTRAMUSCULAR | Status: AC
  Filled 2016-04-18: qty 1

## 2016-04-18 MED ORDER — SERTRALINE HCL 100 MG PO TABS
100.0000 mg | ORAL_TABLET | Freq: Every day | ORAL | Status: DC
  Administered 2016-04-18 – 2016-04-20 (×3): 100 mg via ORAL
  Filled 2016-04-18 (×2): qty 1
  Filled 2016-04-18: qty 2

## 2016-04-18 MED ORDER — HYDROMORPHONE HCL 1 MG/ML IJ SOLN
1.0000 mg | Freq: Once | INTRAMUSCULAR | Status: AC
  Administered 2016-04-18: 1 mg via INTRAVENOUS
  Filled 2016-04-18: qty 1

## 2016-04-18 MED ORDER — LORAZEPAM 2 MG/ML IJ SOLN
INTRAMUSCULAR | Status: AC
  Administered 2016-04-18: 1 mg via INTRAVENOUS
  Filled 2016-04-18: qty 1

## 2016-04-18 MED ORDER — METHYLPREDNISOLONE SODIUM SUCC 125 MG IJ SOLR
60.0000 mg | Freq: Two times a day (BID) | INTRAMUSCULAR | Status: DC
  Administered 2016-04-18 – 2016-04-19 (×3): 60 mg via INTRAVENOUS
  Filled 2016-04-18 (×3): qty 2

## 2016-04-18 MED ORDER — ALBUTEROL SULFATE HFA 108 (90 BASE) MCG/ACT IN AERS: 2.0000 | INHALATION_SPRAY | Freq: Four times a day (QID) | RESPIRATORY_TRACT | Status: DC | PRN

## 2016-04-18 MED ORDER — TRAMADOL HCL 50 MG PO TABS
50.0000 mg | ORAL_TABLET | Freq: Four times a day (QID) | ORAL | Status: DC | PRN
  Administered 2016-04-18 – 2016-04-19 (×3): 50 mg via ORAL
  Filled 2016-04-18 (×4): qty 1

## 2016-04-18 MED ORDER — DICYCLOMINE HCL 20 MG PO TABS: 20.0000 mg | ORAL_TABLET | Freq: Three times a day (TID) | ORAL | Status: DC | PRN

## 2016-04-18 MED ORDER — PRENATAL PLUS 27-1 MG PO TABS
1.0000 | ORAL_TABLET | Freq: Every evening | ORAL | Status: DC
  Filled 2016-04-18 (×3): qty 1

## 2016-04-18 MED ORDER — PANTOPRAZOLE SODIUM 40 MG PO TBEC
40.0000 mg | DELAYED_RELEASE_TABLET | Freq: Every day | ORAL | Status: DC
  Administered 2016-04-18 – 2016-04-20 (×3): 40 mg via ORAL
  Filled 2016-04-18 (×3): qty 1

## 2016-04-18 MED ORDER — SODIUM CHLORIDE 0.9 % IV BOLUS (SEPSIS): 1500.0000 mL | INTRAVENOUS | Status: DC

## 2016-04-18 NOTE — ED Notes (Signed)
Patient transported to CT 

## 2016-04-18 NOTE — H&P (Addendum)
History and Physical    [name] BWL:[id number] DOB: April 18, 1976 DOA: 04/17/2016  Referring MD/NP/PA:   PCP: Eliezer Lofts, MD   Outpatient Specialists: GI, Dr. Wilma Flavin  Patient coming from:  Home  Chief Complaint: Diffuse other joint pain, body aching, fever  HPI: [name] is a 40 y.o. female with medical history significant of Crohn's disease, asthma, GERD, depression, anxiety, endometriosis, vertigo, migraine headache, pulmonary embolism, who presents with a diffuse to joint pain, body aching and fever.  Patient reports that she has Crohn's disease, and has been on Imuran since the beginning of March. She was newly started with Remicade on 04/06/16. She just received one dose so far. She states that she started having severe headache yesterday and was seen in the emergency room. She was treated for migraine headache and discharged home. Patient states that she started having diffuse joint pain, body aching and fever in this morning. The joint pain involves jaws, neck, bilateral shoulders, back, bilateral elbows, bilateral hips and bilateral knees. She also has neck stiffness. She has bilateral generalized weakness, but no unilateral tingling sensations. No vision change or hearing loss. Patient does not have cough, shortness breath, chest pain, nausea, vomiting or symptoms of UTI. Patient does not have any rashes. She has chronic diarrhea due to Crohn's disease, which has not changed in nature.  ED Course: pt was found to have WBC 17.7, temperature 101.3, tachycardia, tachypnea, electrolytes and renal function okay, urinalysis negative, negative troponin, INR 1.10, lactate 1.80. Chest x-ray is negative for acute abnormalities. CT-head and CT of C-spine is negative for acute abnormalities. MRI of lumbar showed disc bulging in L3-L4, but no cord compression. Patient is admitted to inpatient for further evaluation and treatment.  Review of Systems:   General: has fevers, chills, no  changes in body weight, has poor appetite, has fatigue HEENT: no blurry vision, hearing changes or sore throat Pulm: no dyspnea, coughing, wheezing CV: no chest pain, no palpitations Abd: no nausea, vomiting, abdominal pain, diarrhea, constipation GU: no dysuria, burning on urination, increased urinary frequency, hematuria  Ext: no leg edema Neuro: no unilateral weakness, numbness, or tingling, no vision change or hearing loss Skin: no rash MSK: has diffused joint pain Heme: No easy bruising.  Travel history: No recent long distant travel.  Allergy:  Allergies  Allergen Reactions  . Compazine [Prochlorperazine Edisylate] Anaphylaxis  . Reglan [Metoclopramide] Anaphylaxis and Other (See Comments)    Other reaction(s): GI Upset (intolerance) Intensifies her Chron's  . Sulfamethoxazole Rash and Other (See Comments)    Internal bleeding and kidney infection  . Biaxin [Clarithromycin] Other (See Comments)    Cant take due to Chrons disease  . Chlorhexidine Other (See Comments)    Blisters after using a couple of times.   . Codeine Nausea And Vomiting and Other (See Comments)    Dilaudid and demerol OK  . Hydrocodone Nausea And Vomiting  . Macrobid [Nitrofurantoin] Nausea And Vomiting    Other reaction(s): GI Upset (intolerance)  . Other Rash    "chloraprep"  . Promethazine Rash and Other (See Comments)    Muscular seizures  . Sulfa Antibiotics Hives and Other (See Comments)    Hematemesis; documented while a young child    Past Medical History  Diagnosis Date  . Crohn disease ([place]) 2000  . Asthma   . Endometriosis   . Dysautonomia     recurrent syncope s/p ILR by Dr Doreatha Lew  . Palpitations   . Anxiety   .  GERD (gastroesophageal reflux disease)   . Vertigo   . Anginal pain ([place]) only on 09/22/2014  . H/O hiatal hernia   . Chest pain     a. s/p intermediate risk nuclear stress test on 09/23/14 and normal LHC on 09/24/14   . Chronic nausea   . Migraine   . Pulmonary  embolism affecting pregnancy     Past Surgical History  Procedure Laterality Date  . Ileocecetomy  2002 X 2  . Loop recorder implant  11/2009    by DR Doreatha Lew  . Knee arthroscopy Bilateral 1988-1990  . Cesarean section  2011    emergency  CS due to placental abruption  . Partial colectomy  2002    partial removed due to crohns  . Appendectomy  ~ 1992  . Tonsillectomy  ~ 1996  . US echocardiography  11/11/2009    EF 55-60%  . Laparoscopy for ectopic pregnancy  11/2012  . Nasal sinus surgery  ~ 2007  . Endoscopic release transverse carpal ligament of hand Right 12/2010  . Colon surgery    . Cystoscopy w/ stone manipulation  X 2  . Cesarean section  2014    "preeclampsia"  . Left heart catheterization with coronary angiogram N/A 09/24/2014    Procedure: LEFT HEART CATHETERIZATION WITH CORONARY ANGIOGRAM;  Surgeon: Leonie Man, MD;  Location[facility] C[facility] LAB;  Service: Cardiovascular;  Laterality: N/A;    Social History:  reports that she has never smoked. She has never used smokeless tobacco. She reports that she drinks alcohol. She reports that she does not use illicit drugs.  Family History:  Family History  Problem Relation Age of Onset  . Hypertension Mother   . Hyperlipidemia Mother   . Arthritis Mother   . Diabetes Father   . Coronary artery disease Father   . Heart disease Father 21    MI age 84  . Hyperlipidemia Brother   . Hypertension Brother   . Colon cancer Paternal Grandmother      Prior to Admission medications   Medication Sig Start Date End Date Taking? Authorizing Provider  acetaminophen (TYLENOL) 500 MG tablet Take 1,000 mg by mouth every 6 (six) hours as needed for headache.    Historical Provider, MD  albuterol (PROVENTIL HFA;VENTOLIN HFA) 108 (90 BASE) MCG/ACT inhaler Inhale 2 puffs into the lungs every 6 (six) hours as needed for wheezing or shortness of breath. 07/25/15   Jearld Fenton, NP  ALPRAZolam Duanne Moron) 0.25 MG tablet TAKE 2 TABLETS BY MOUTH  EVERY DAY AS NEEDED 03/06/16   Jinny Sanders, MD  azaTHIOprine (IMURAN) 50 MG tablet Take 150 mg daily 01/23/16   Manus Gunning, MD  butalbital-acetaminophen-caffeine (FIORICET, ESGIC) [phone] MG tablet TAKE 2 TABLETS EVERY 6 HOURS AS NEEDED 04/16/16   Zenovia Jarred, DO  Cetirizine HCl (ZYRTEC ALLERGY PO) Take 1 tablet by mouth daily.     Historical Provider, MD  cyanocobalamin (,VITAMIN B-12,) 1000 MCG/ML injection Inject 1,000 mcg into the muscle every 21 ( twenty-one) days.    Historical Provider, MD  cyclobenzaprine (FLEXERIL) 10 MG tablet Take 1 tablet (10 mg total) by mouth at bedtime as needed for muscle spasms. 04/17/16   Amy Cletis Athens, MD  dexlansoprazole (DEXILANT) 60 MG capsule Take 1 capsule (60 mg total) by mouth daily. 09/08/15   Amy Cletis Athens, MD  dicyclomine (BENTYL) 20 MG tablet Take 20 mg by mouth 3 (three) times daily as needed for spasms.     Historical  Provider, MD  HYDROmorphone (DILAUDID) 2 MG tablet Take 1/2 to 1 tab every 4-6 hours as needed for pain. 10/26/15   Amy S Esterwood, PA-C  lactobacillus acidophilus (BACID) TABS tablet Take 2 tablets by mouth 3 (three) times daily.    Historical Provider, MD  ondansetron (ZOFRAN) 4 MG tablet Take 1 tablet (4 mg total) by mouth every 8 (eight) hours as needed for nausea or vomiting. 04/16/16   Zenovia Jarred, DO  Prenatal Vit-Fe Fumarate-FA (PRENATAL PO) Take 2 tablets by mouth every evening. Prenatal gummy vitamin.    Historical Provider, MD  ranitidine (ZANTAC) 150 MG tablet Take 1 tablet (150 mg total) by mouth every morning. 01/17/16   Manus Gunning, MD  sertraline (ZOLOFT) 100 MG tablet Take 1 tablet (100 mg total) by mouth daily. 12/28/15   Amy Cletis Athens, MD  traMADol (ULTRAM) 50 MG tablet Take 50 mg by mouth every 6 (six) hours as needed for moderate pain or severe pain.    Historical Provider, MD  zolpidem (AMBIEN) 10 MG tablet TAKE ONE TABLET BY MOUTH EVERY NIGHT AT BEDTIME 03/26/16   Jinny Sanders, MD     Physical Exam: Filed Vitals:   04/18/16 0139 04/18/16 0247 04/18/16 0324 04/18/16 0345  BP: 139/88 130/75 128/78 105/67  Pulse: 114 130 127 125  Temp:  100 F (37.8 C)    TempSrc:  Oral    Resp: 25 22 29 19   Height:      Weight:      SpO2: 100% 100% 100% 97%   General: Not in acute distress HEENT:       Eyes: PERRL, EOMI, no scleral icterus.       ENT: No discharge from the ears and nose, no pharynx injection, no tonsillar enlargement.        Neck: No JVD, no bruit, no mass felt. Heme: No neck lymph node enlargement. Cardiac: S1/S2, RRR, No murmurs, No gallops or rubs. Pulm:  No rales, wheezing, rhonchi or rubs. Abd: Soft, nondistended, nontender, no rebound pain, no organomegaly, BS present. GU: No hematuria Ext: No pitting leg edema bilaterally. 2+DP/PT pulse bilaterally. Musculoskeletal: has diffuse joint tenderness. Skin: No rashes.  Neuro: Alert, oriented X3, cranial nerves II-XII grossly intact, moves all extremities normally. Muscle strength 5/5 in all extremities, sensation to light touch intact. Knee reflex 1+ bilaterally. Negative Babinski's sign. Normal finger to nose test. Psych: Patient is not psychotic, no suicidal or hemocidal ideation.  Labs on Admission: I have personally reviewed following labs and imaging studies  CBC:  Recent Labs Lab 04/17/16 2345  WBC 17.7*  NEUTROABS 14.4*  HGB 12.1  HCT 37.9  MCV 88.3  PLT 225   Basic Metabolic Panel:  Recent Labs Lab 04/17/16 2345  NA 141  K 3.7  CL 110  CO2 20*  GLUCOSE 117*  BUN 12  CREATININE 0.83  CALCIUM 8.7*   GFR: Estimated Creatinine Clearance: 84.1 mL/min (by C-G formula based on Cr of 0.83). Liver Function Tests:  Recent Labs Lab 04/17/16 2345  AST 19  ALT 14  ALKPHOS 67  BILITOT 0.3  PROT 6.7  ALBUMIN 3.6   No results for input(s): LIPASE, AMYLASE in the last 168 hours. No results for input(s): AMMONIA in the last 168 hours. Coagulation Profile:  Recent Labs Lab  04/17/16 2345  INR 1.10   Cardiac Enzymes:  Recent Labs Lab 04/17/16 2345  TROPONINI <0.03   BNP (last 3 results) No results for input(s): PROBNP in the  last 8760 hours. HbA1C: No results for input(s): HGBA1C in the last 72 hours. CBG: No results for input(s): GLUCAP in the last 168 hours. Lipid Profile: No results for input(s): CHOL, HDL, LDLCALC, TRIG, CHOLHDL, LDLDIRECT in the last 72 hours. Thyroid Function Tests: No results for input(s): TSH, T4TOTAL, FREET4, T3FREE, THYROIDAB in the last 72 hours. Anemia Panel: No results for input(s): VITAMINB12, FOLATE, FERRITIN, TIBC, IRON, RETICCTPCT in the last 72 hours. Urine analysis:    Component Value Date/Time   COLORURINE YELLOW 04/18/2016 0000   APPEARANCEUR CLOUDY* 04/18/2016 0000   LABSPEC 1.031* 04/18/2016 0000   PHURINE 6.0 04/18/2016 0000   GLUCOSEU NEGATIVE 04/18/2016 0000   HGBUR SMALL* 04/18/2016 0000   BILIRUBINUR NEGATIVE 04/18/2016 0000   BILIRUBINUR negative 10/14/2015 1652   BILIRUBINUR negative 08/20/2014 1626   KETONESUR NEGATIVE 04/18/2016 0000   KETONESUR negative 10/14/2015 1652   PROTEINUR NEGATIVE 04/18/2016 0000   PROTEINUR negative 10/14/2015 1652   PROTEINUR trace 08/20/2014 1626   UROBILINOGEN 0.2 10/14/2015 1652   UROBILINOGEN 0.2 03/20/2015 1805   NITRITE NEGATIVE 04/18/2016 0000   NITRITE Negative 10/14/2015 1652   NITRITE negative 08/20/2014 1626   LEUKOCYTESUR NEGATIVE 04/18/2016 0000   Sepsis Labs: @LABRCNTIP (procalcitonin:4,lacticidven:4) ) Recent Results (from the past 240 hour(s))  CSF culture     Status: None (Preliminary result)   Collection Time: 04/18/16  3:20 AM  Result Value Ref Range Status   Specimen Description CSF  Final   Special Requests NONE  Final   Gram Stain   Final    CYTOSPIN SMEAR WBC PRESENT, PREDOMINANTLY MONONUCLEAR NO ORGANISMS SEEN    Culture PENDING  Incomplete   Report Status PENDING  Incomplete     Radiological Exams on Admission: Dg Chest 1  View  04/18/2016  CLINICAL DATA:  Sepsis. EXAM: CHEST 1 VIEW COMPARISON:  07/25/2015 FINDINGS: Normal heart size and mediastinal contours. Event recorder over the left chest. No acute infiltrate or edema. No effusion or pneumothorax. No osseous findings. IMPRESSION: Negative for pneumonia. Electronically Signed   By: Monte Fantasia M.D.   On: 04/18/2016 01:54   Ct Head Wo Contrast  04/18/2016  CLINICAL DATA:  Headache and fever.  Locked jaw. EXAM: CT HEAD WITHOUT CONTRAST CT NECK WITH CONTRAST TECHNIQUE: Contiguous axial images were obtained from the base of the skull through the vertex without contrast. Multidetector CT imaging of the neck was performed using the standard protocol with intravenous contrast. CONTRAST:  75 cc Isovue 300 intravenous COMPARISON:  03/13/2012 brain MRI FINDINGS: CT HEAD FINDINGS Skull and Sinuses:Negative for fracture or destructive process. The visualized mastoids, middle ears, and imaged paranasal sinuses are clear. Visualized orbits: Negative. Brain: Normal. No evidence of acute infarction, hemorrhage, hydrocephalus, or mass lesion/mass effect. CT NECK FINDINGS Negative larynx and pharynx with no suspicious asymmetry or enhancement. Normal appearance of the salivary and thyroid glands. No lymphadenopathy. Negative vascular structures. No swelling or abnormal enhancement in the muscles of mastication to explain history of locked jaw. Clear apical lungs. No lymphadenopathy in the neck or upper chest. Partial intracranial imaging is negative. Normal appearance of the orbits. No acute sinusitis or mastoiditis. IMPRESSION: Negative head and cervical spine CT.  No explanation for symptoms. Electronically Signed   By: Monte Fantasia M.D.   On: 04/18/2016 02:46   Ct Soft Tissue Neck W Contrast  04/18/2016  CLINICAL DATA:  Headache and fever.  Locked jaw EXAM: CT HEAD WITHOUT CONTRAST CT NECK WITH CONTRAST TECHNIQUE: Contiguous axial images were obtained from the  base of the skull  through the vertex without contrast. Multidetector CT imaging of the neck was performed using the standard protocol with intravenous contrast. CONTRAST:  75 cc Isovue 300 intravenous COMPARISON:  None. FINDINGS: CT HEAD FINDINGS Skull and Sinuses:Negative for fracture or destructive process. The visualized mastoids, middle ears, and imaged paranasal sinuses are clear. Visualized orbits: Negative. Brain: No evidence of acute infarction, hemorrhage, hydrocephalus, or mass lesion/mass effect. CT NECK FINDINGS Negative larynx and pharynx with no suspicious asymmetry or enhancement. Normal appearance of the salivary and thyroid glands. No lymphadenopathy. Negative vascular structures. No swelling or abnormal enhancement in the muscles of mastication to explain history of locked jaw. Clear apical lungs. No lymphadenopathy in the neck or upper chest. Partial intracranial imaging is negative. Normal appearance of the orbits. No acute sinusitis or mastoiditis. IMPRESSION: Negative head and neck CT.  No explanation for symptoms. Electronically Signed   By: Monte Fantasia M.D.   On: 04/18/2016 03:05   Mr Lumbar Spine W Wo Contrast  04/18/2016  CLINICAL DATA:  Initial evaluation for acute low back pain. EXAM: MRI LUMBAR SPINE WITHOUT AND WITH CONTRAST TECHNIQUE: Multiplanar and multiecho pulse sequences of the lumbar spine were obtained without and with intravenous contrast. CONTRAST:  14m MULTIHANCE GADOBENATE DIMEGLUMINE 529 MG/ML IV SOLN COMPARISON:  None. FINDINGS: For the purposes of this dictation, the lowest well-formed intervertebral disc spaces presumed to be the L5-S1 level, and there presumed to be 5 lumbar type vertebral bodies. Vertebral bodies are normally aligned with preservation of the normal lumbar lordosis. Vertebral body heights are well maintained. No fracture or malalignment. Signal intensity within the vertebral body bone marrow is normal. Tiny probable hemangioma noted within the L4 vertebral body.  No other focal osseous lesions. No marrow edema. No abnormal enhancement. Conus medullaris terminates normally at the L1 level. Signal intensity within the visualized cord is normal. Nerve roots of the cauda equina within normal limits. Paraspinous soft tissues within normal limits. No abnormal enhancement. L3-4: Mild degenerative disc desiccation with disc bulge. Anterior endplate osteophytic spurring. No focal disc herniation. No significant stenosis. No other significant degenerative changes identified within the lumbar spine. No other significant disc bulging or focal disc herniations. No significant facet disease. No canal or foraminal stenosis. IMPRESSION: 1. Mild degenerative disc bulge and desiccation at L3-4 without stenosis. 2. Otherwise normal MRI of the lumbar spine. No other significant degenerative changes. No canal or foraminal stenosis. Electronically Signed   By: BJeannine BogaM.D.   On: 04/18/2016 01:27     EKG: Independently reviewed. QTC 463, tachycardia, no ischemic change.  Assessment/Plan Principal Problem:   Serum sickness Active Problems:   Crohn's disease (HCC)   Asthma, moderate persistent   GERD (gastroesophageal reflux disease)   Migraine   Generalized anxiety disorder   Depression, major, recurrent, moderate (HCC)   Immunosuppressed status (HCC)   Sepsis (HCC)  Serum sickness vs. sepsis: The most likely etiology for patient's fever and diffuse joint pain is Serum sickness secondary to Remicade use. Patient meets criteria for sepsis with leukocytosis, tachycardia and fever. Sepsis is an another potent differential diagnosis, but no source of infection was identified, therefore sepsis is less likely though it cannot completely be ruled out at this time. Patient has negative urinalysis and chest x-ray. Given her immunosuppressed status, will treat patient with antibiotics empirically and follow up the blood and urine culture. LP was performed by EDP, CSF analysis  is pending. EDP discussed with ID, Dr. SBaxter Flattery who agreed  to start the patient with atibiotics.  -will admit to tele bed as inpt -Solu-Medrol, 60 mg twice a day for possible serum sickness -IV vancomycin plus cefepime -will get Procalcitonin and trend lactic acid levels per sepsis protocol. -IVF: 2.5L of NS bolus in ED, followed by 100 cc/h  -f/u Bx and Ux -ESR, CRP, ANA -f/u CSF analysis  Crohn's disease ([place]): has been followed up by GI, Dr. Wilma Flavin. On Imuran and recently started Remicade on 04/06/16. Patient seems to have serum sickness reaction to Remicade. -Hold Remicade -continue Imuran -Continu Bentyl, Bacid  Asthma: stable -continue albuterol inhaler  GERD: -Protonix and pepcid  Depression and anxiety: Stable, no suicidal or homicidal ideations. -Continue home medications: Zoloft and Xanax  DVT ppx: SCD Code Status: Full code Family Communication: Yes, patient's husband at bed side Disposition Plan:  Anticipate discharge back to previous home environment Consults called:  EDP discussed with ID, Dr. Baxter Flattery Admission status: Obs / tele  Date of Service 04/18/2016    Ivor Costa [facility] Pager [phone]  If 7PM-7AM, please contact night-coverage [email or web address] Password [facility] 04/18/2016, 4:25 AM

## 2016-04-18 NOTE — Telephone Encounter (Signed)
PLEASE NOTE: All timestamps contained within this report are represented as [place] Standard Time. CONFIDENTIALTY NOTICE: This fax transmission is intended only for the addressee. It contains information that is legally privileged, confidential or otherwise protected from use or disclosure. If you are not the intended recipient, you are strictly prohibited from reviewing, disclosing, copying using or disseminating any of this information or taking any action in reliance on or regarding this information. If you have received this fax in error, please notify us immediately by telephone so that we can arrange for its return to [name]. Phone: [phone], Toll-Free: [phone], Fax: [phone] Page: 1 of 2 Call Id: [id number] [place] Patient Name: [name] Gender: Female DOB: [birth year] Age: 40 Y 3 M 10 D Return Phone Number: [phone] (Primary) Address: City/State/Zip: [place] Client [place] Night - Client Client Site [place] Physician Diona Browner, Amy Contact Type Call Who Is Calling Patient / Member / Family / Caregiver Call Type Triage / Clinical Relationship To Patient Self Return Phone Number [phone] (Primary) Chief Complaint Joint Pain Reason for Call Symptomatic / Request for Health Information Initial Comment caller states she was seen in the ER for a migraine - was given a muscle relaxer. Now her jaw, neck, shoulder and hips are locking up - all her joints are locked up - is in alot of pain PreDisposition Go to ED Translation No Nurse Assessment Nurse: Harlow Mares, RN, Suanne Marker Date/Time (Eastern Time): 04/17/2016 8:02:49 PM Confirm and document reason for call. If symptomatic, describe symptoms. You must click the next button to save text entered. ---caller states she was seen in the ER for a migraine yesterday - was given a  migraine cocktail. Now her jaw, neck, shoulder and hips are locking up - all her joints are locked up - is in a lot of pain. Reports that she called the MD office yesterday and symptoms have worsening today. She can barely get anything in her mouth. she was told to take a xanax and a muscle relaxer. This has not helped. She has been in contact with the MD office all day and nothing is helping. Has the patient traveled out of the country within the last 30 days? ---Not Applicable Does the patient have any new or worsening symptoms? ---Yes Will a triage be completed? ---Yes Related visit to physician within the last 2 weeks? ---Yes Does the PT have any chronic conditions? (i.e. diabetes, asthma, etc.) ---Unknown Is the patient pregnant or possibly pregnant? (Ask all females between the ages of 25-55) ---No Is this a behavioral health or substance abuse call? ---No Guidelines Guideline Title Affirmed Question Affirmed Notes Nurse Date/Time (Eastern Time) Face Pain Patient sounds very sick or weak to the triager Harlow Mares, RN, Suanne Marker 04/17/2016 8:06:30 PM PLEASE NOTE: All timestamps contained within this report are represented as [place] Standard Time. CONFIDENTIALTY NOTICE: This fax transmission is intended only for the addressee. It contains information that is legally privileged, confidential or otherwise protected from use or disclosure. If you are not the intended recipient, you are strictly prohibited from reviewing, disclosing, copying using or disseminating any of this information or taking any action in reliance on or regarding this information. If you have received this fax in error, please notify us immediately by telephone so that we can arrange for its return to [name]. Phone: [phone], Toll-Free: [phone], Fax: [phone] Page: 2 of 2 Call Id:  [id number] RHZJ. Time Eilene Ghazi Time) Disposition Final User 04/17/2016 8:07:35 PM Go to ED Now (or PCP triage) Yes Harlow Mares, RN,  Rosalyn Charters Understands: Yes Disagree/Comply: Comply Care Advice Given Per Guideline GO TO ED NOW (OR PCP TRIAGE): * IF NO PCP TRIAGE: You need to be seen. [facility] to the [facility] at [facility] within the next hour. Leave as soon as you can. DRIVING: Another adult should drive. BRING MEDICINES: * Please bring a list of your current medicines when you go to see the doctor. * It is also a good idea to bring the pill bottles too. This will help the doctor to make certain you are taking the right medicines and the right dose. CARE ADVICE given per Face Pain (Adult) guideline. Referrals [facility] - ED

## 2016-04-18 NOTE — ED Notes (Signed)
Pt placed on bedpan

## 2016-04-18 NOTE — H&P (Signed)
PROGRESS NOTE    [name]  ZOX:[id number] DOB: 21-Aug-1976 DOA: 04/17/2016 PCP: Eliezer Lofts, MD  Outpatient Specialists:   Brief Narrative: 40 year old Caucasian female with history of Crohn's disease, asthma, endometriosis and PE. Patient has been on Imuran for a long time, and was started on Infliximab (Remicade) about 12 days ago. Patient was admitted with multiple joint pain and stiffness, neck stiffness and fever. No rash reported. Patient has some headache prior to onset of symptoms. Patient is currently managed presumptively for possible serum sickness secondary to remicade. Procalcitonin is within normal limits. Patient fells better with steroids and supportive care. No history of exposure to ticks or tick bites. LP fluid analysis is non revealing.  Patient is still on IV antibiotics while cultures are pending.  Assessment & Plan:   Principal Problem:   Serum sickness Active Problems:   Crohn's disease (HCC)   Asthma, moderate persistent   GERD (gastroesophageal reflux disease)   Migraine   Generalized anxiety disorder   Depression, major, recurrent, moderate (HCC)   Immunosuppressed status (HCC)   Sepsis (HCC)   Assessment/Plan Principal Problem:  Possible Serum sickness Active Problems:  Crohn's disease (HCC)  Asthma, moderate persistent  GERD (gastroesophageal reflux disease)  Migraine  Generalized anxiety disorder  Depression, major, recurrent, moderate (HCC)  Immunosuppressed status (HCC)  SIRS. (HCC)  Serum sickness/SIRS -  Continue current management (IV steroids and antibiotics). Follow cultures. Likely DC antibiotics if cultures are non revealing. Further management will depend on hospital course.  Crohn's disease [facility]): has been followed up by GI, Dr. Wilma Flavin. On Imuran and recently started Remicade on 04/06/16. Patient seems to have serum sickness reaction to Remicade. Continue to hold Remicade.   Asthma: stable -continue albuterol  inhaler  GERD: -Protonix and pepcid  Depression and anxiety: Stable.  DVT ppx: SCD Code Status: Full code Family Communication: Yes, patient's husband at bed side Disposition Plan: Anticipate discharge back to previous home environment Consults called: EDP discussed with ID, Dr. Baxter Flattery Admission status: Obs / tele   Procedures:   Antimicrobials:    As above.    Subjective: No new complaints. Feels better. Joint pain and stiffness are improving.  Objective: Filed Vitals:   04/18/16 0515 04/18/16 0518 04/18/16 0549 04/18/16 0700  BP: 102/61  111/70   Pulse: 116  113   Temp:  98.4 F (36.9 C) 98.7 F (37.1 C)   TempSrc:  Oral Oral   Resp: 16  18   Height:   5' 4"  (1.626 m)   Weight:    67.813 kg (149 lb 8 oz)  SpO2: 98%  100%     Intake/Output Summary (Last 24 hours) at 04/18/16 1614 Last data filed at 04/18/16 1530  Gross per 24 hour  Intake   2370 ml  Output   1400 ml  Net    970 ml   Filed Weights   04/17/16 2117 04/18/16 0700  Weight: 65.772 kg (145 lb) 67.813 kg (149 lb 8 oz)    Examination:  General exam: Appears calm and comfortable. Not in distress. Respiratory system: Clear to auscultation. Respiratory effort normal. Cardiovascular system: S1 & S2 heard, RRR. No JVD, murmurs, rubs, gallops or clicks. No pedal edema. Gastrointestinal system: Abdomen is nondistended, soft and nontender. No organomegaly or masses felt. Normal bowel sounds heard. Central nervous system: Alert and oriented. No focal neurological deficits. Extremities: Symmetric 5 x 5 power. Skin: No rashes, lesions or ulcers Psychiatry: Judgement and insight appear normal. Mood &  affect appropriate.     Data Reviewed: I have personally reviewed following labs and imaging studies  CBC:  Recent Labs Lab 04/17/16 2345 04/18/16 0450  WBC 17.7* 14.9*  NEUTROABS 14.4*  --   HGB 12.1 10.5*  HCT 37.9 32.5*  MCV 88.3 87.8  PLT 347 016   Basic Metabolic Panel:  Recent  Labs Lab 04/17/16 2345 04/18/16 0450  NA 141 139  K 3.7 3.3*  CL 110 112*  CO2 20* 18*  GLUCOSE 117* 90  BUN 12 8  CREATININE 0.83 0.60  CALCIUM 8.7* 7.4*   GFR: Estimated Creatinine Clearance: 88.4 mL/min (by C-G formula based on Cr of 0.6). Liver Function Tests:  Recent Labs Lab 04/17/16 2345  AST 19  ALT 14  ALKPHOS 67  BILITOT 0.3  PROT 6.7  ALBUMIN 3.6   No results for input(s): LIPASE, AMYLASE in the last 168 hours. No results for input(s): AMMONIA in the last 168 hours. Coagulation Profile:  Recent Labs Lab 04/17/16 2345  INR 1.10   Cardiac Enzymes:  Recent Labs Lab 04/17/16 2345  TROPONINI <0.03   BNP (last 3 results) No results for input(s): PROBNP in the last 8760 hours. HbA1C: No results for input(s): HGBA1C in the last 72 hours. CBG:  Recent Labs Lab 04/18/16 1002  GLUCAP 114*   Lipid Profile: No results for input(s): CHOL, HDL, LDLCALC, TRIG, CHOLHDL, LDLDIRECT in the last 72 hours. Thyroid Function Tests: No results for input(s): TSH, T4TOTAL, FREET4, T3FREE, THYROIDAB in the last 72 hours. Anemia Panel: No results for input(s): VITAMINB12, FOLATE, FERRITIN, TIBC, IRON, RETICCTPCT in the last 72 hours. Urine analysis:    Component Value Date/Time   COLORURINE YELLOW 04/18/2016 0000   APPEARANCEUR CLOUDY* 04/18/2016 0000   LABSPEC 1.031* 04/18/2016 0000   PHURINE 6.0 04/18/2016 0000   GLUCOSEU NEGATIVE 04/18/2016 0000   HGBUR SMALL* 04/18/2016 0000   BILIRUBINUR NEGATIVE 04/18/2016 0000   BILIRUBINUR negative 10/14/2015 1652   BILIRUBINUR negative 08/20/2014 1626   KETONESUR NEGATIVE 04/18/2016 0000   KETONESUR negative 10/14/2015 1652   PROTEINUR NEGATIVE 04/18/2016 0000   PROTEINUR negative 10/14/2015 1652   PROTEINUR trace 08/20/2014 1626   UROBILINOGEN 0.2 10/14/2015 1652   UROBILINOGEN 0.2 03/20/2015 1805   NITRITE NEGATIVE 04/18/2016 0000   NITRITE Negative 10/14/2015 1652   NITRITE negative 08/20/2014 1626    LEUKOCYTESUR NEGATIVE 04/18/2016 0000   Sepsis Labs: @LABRCNTIP (procalcitonin:4,lacticidven:4)  ) Recent Results (from the past 240 hour(s))  CSF culture     Status: None (Preliminary result)   Collection Time: 04/18/16  3:20 AM  Result Value Ref Range Status   Specimen Description CSF  Final   Special Requests NONE  Final   Gram Stain   Final    CYTOSPIN SMEAR WBC PRESENT, PREDOMINANTLY MONONUCLEAR NO ORGANISMS SEEN    Culture PENDING  Incomplete   Report Status PENDING  Incomplete         Radiology Studies: Dg Chest 1 View  04/18/2016  CLINICAL DATA:  Sepsis. EXAM: CHEST 1 VIEW COMPARISON:  07/25/2015 FINDINGS: Normal heart size and mediastinal contours. Event recorder over the left chest. No acute infiltrate or edema. No effusion or pneumothorax. No osseous findings. IMPRESSION: Negative for pneumonia. Electronically Signed   By: Monte Fantasia M.D.   On: 04/18/2016 01:54   Ct Head Wo Contrast  04/18/2016  CLINICAL DATA:  Headache and fever.  Locked jaw. EXAM: CT HEAD WITHOUT CONTRAST CT NECK WITH CONTRAST TECHNIQUE: Contiguous axial images were obtained from the  base of the skull through the vertex without contrast. Multidetector CT imaging of the neck was performed using the standard protocol with intravenous contrast. CONTRAST:  75 cc Isovue 300 intravenous COMPARISON:  03/13/2012 brain MRI FINDINGS: CT HEAD FINDINGS Skull and Sinuses:Negative for fracture or destructive process. The visualized mastoids, middle ears, and imaged paranasal sinuses are clear. Visualized orbits: Negative. Brain: Normal. No evidence of acute infarction, hemorrhage, hydrocephalus, or mass lesion/mass effect. CT NECK FINDINGS Negative larynx and pharynx with no suspicious asymmetry or enhancement. Normal appearance of the salivary and thyroid glands. No lymphadenopathy. Negative vascular structures. No swelling or abnormal enhancement in the muscles of mastication to explain history of locked jaw. Clear  apical lungs. No lymphadenopathy in the neck or upper chest. Partial intracranial imaging is negative. Normal appearance of the orbits. No acute sinusitis or mastoiditis. IMPRESSION: Negative head and cervical spine CT.  No explanation for symptoms. Electronically Signed   By: Monte Fantasia M.D.   On: 04/18/2016 02:46   Ct Soft Tissue Neck W Contrast  04/18/2016  CLINICAL DATA:  Headache and fever.  Locked jaw EXAM: CT HEAD WITHOUT CONTRAST CT NECK WITH CONTRAST TECHNIQUE: Contiguous axial images were obtained from the base of the skull through the vertex without contrast. Multidetector CT imaging of the neck was performed using the standard protocol with intravenous contrast. CONTRAST:  75 cc Isovue 300 intravenous COMPARISON:  None. FINDINGS: CT HEAD FINDINGS Skull and Sinuses:Negative for fracture or destructive process. The visualized mastoids, middle ears, and imaged paranasal sinuses are clear. Visualized orbits: Negative. Brain: No evidence of acute infarction, hemorrhage, hydrocephalus, or mass lesion/mass effect. CT NECK FINDINGS Negative larynx and pharynx with no suspicious asymmetry or enhancement. Normal appearance of the salivary and thyroid glands. No lymphadenopathy. Negative vascular structures. No swelling or abnormal enhancement in the muscles of mastication to explain history of locked jaw. Clear apical lungs. No lymphadenopathy in the neck or upper chest. Partial intracranial imaging is negative. Normal appearance of the orbits. No acute sinusitis or mastoiditis. IMPRESSION: Negative head and neck CT.  No explanation for symptoms. Electronically Signed   By: Monte Fantasia M.D.   On: 04/18/2016 03:05   Mr Lumbar Spine W Wo Contrast  04/18/2016  CLINICAL DATA:  Initial evaluation for acute low back pain. EXAM: MRI LUMBAR SPINE WITHOUT AND WITH CONTRAST TECHNIQUE: Multiplanar and multiecho pulse sequences of the lumbar spine were obtained without and with intravenous contrast. CONTRAST:   107m MULTIHANCE GADOBENATE DIMEGLUMINE 529 MG/ML IV SOLN COMPARISON:  None. FINDINGS: For the purposes of this dictation, the lowest well-formed intervertebral disc spaces presumed to be the L5-S1 level, and there presumed to be 5 lumbar type vertebral bodies. Vertebral bodies are normally aligned with preservation of the normal lumbar lordosis. Vertebral body heights are well maintained. No fracture or malalignment. Signal intensity within the vertebral body bone marrow is normal. Tiny probable hemangioma noted within the L4 vertebral body. No other focal osseous lesions. No marrow edema. No abnormal enhancement. Conus medullaris terminates normally at the L1 level. Signal intensity within the visualized cord is normal. Nerve roots of the cauda equina within normal limits. Paraspinous soft tissues within normal limits. No abnormal enhancement. L3-4: Mild degenerative disc desiccation with disc bulge. Anterior endplate osteophytic spurring. No focal disc herniation. No significant stenosis. No other significant degenerative changes identified within the lumbar spine. No other significant disc bulging or focal disc herniations. No significant facet disease. No canal or foraminal stenosis. IMPRESSION: 1. Mild degenerative disc bulge  and desiccation at L3-4 without stenosis. 2. Otherwise normal MRI of the lumbar spine. No other significant degenerative changes. No canal or foraminal stenosis. Electronically Signed   By: Jeannine Boga M.D.   On: 04/18/2016 01:27        Scheduled Meds: . acidophilus  2 capsule Oral TID  . azaTHIOprine  150 mg Oral Daily  . ceFEPime (MAXIPIME) IV  2 g Intravenous Q8H  . [START ON 04/22/2016] cyanocobalamin  1,000 mcg Intramuscular Q21 days  . famotidine  20 mg Oral Daily  . loratadine  10 mg Oral Daily  . methylPREDNISolone (SOLU-MEDROL) injection  60 mg Intravenous Q12H  . pantoprazole  40 mg Oral Daily  . prenatal vitamin w/FE, FA  1 tablet Oral QPM  . sertraline   100 mg Oral Daily  . sodium chloride flush  3 mL Intravenous Q12H  . vancomycin  750 mg Intravenous Q8H  . zolpidem  5 mg Oral QHS   Continuous Infusions: . sodium chloride 1,000 mL (04/18/16 1323)     LOS: 0 days    Time spent: 30 minutes.    Bonnell Public, MD [facility] Pager [phone].  If 7PM-7AM, please contact night-coverage [email or web address] Password [facility] 04/18/2016, 4:14 PM

## 2016-04-18 NOTE — Progress Notes (Signed)
Pharmacy Antibiotic Note  [name] is a 40 y.o. female admitted on 04/17/2016 with sepsis.  Pharmacy has been consulted for Vancomycin/Cefepime dosing. Source unclear. WBC elevated. Renal function good. Other labs reviewed.   Plan: -Vancomycin 750 mg IV q8h -Cefepime 2g IV q8h -Trend WBC, temp, renal function  -Drug levels as indicated   Height: 5' 4"  (162.6 cm) Weight: 145 lb (65.772 kg) IBW/kg (Calculated) : 54.7  Temp (24hrs), Avg:100.2 F (37.9 C), Min:99.1 F (37.3 C), Max:101.3 F (38.5 C)   Recent Labs Lab 04/17/16 2345 04/18/16 0011  WBC 17.7*  --   CREATININE 0.83  --   LATICACIDVEN  --  1.80    Estimated Creatinine Clearance: 84.1 mL/min (by C-G formula based on Cr of 0.83).    Allergies  Allergen Reactions  . Compazine [Prochlorperazine Edisylate] Anaphylaxis  . Reglan [Metoclopramide] Anaphylaxis and Other (See Comments)    Other reaction(s): GI Upset (intolerance) Intensifies her Chron's  . Sulfamethoxazole Rash and Other (See Comments)    Internal bleeding and kidney infection  . Biaxin [Clarithromycin] Other (See Comments)    Cant take due to Chrons disease  . Chlorhexidine Other (See Comments)    Blisters after using a couple of times.   . Codeine Nausea And Vomiting and Other (See Comments)    Dilaudid and demerol OK  . Hydrocodone Nausea And Vomiting  . Macrobid [Nitrofurantoin] Nausea And Vomiting    Other reaction(s): GI Upset (intolerance)  . Promethazine Rash and Other (See Comments)    Muscular seizures  . Sulfa Antibiotics Hives and Other (See Comments)    Hematemesis; documented while a young child     Narda Bonds 04/18/2016 1:17 AM

## 2016-04-18 NOTE — ED Notes (Signed)
Patient transported to MRI 

## 2016-04-18 NOTE — Telephone Encounter (Signed)
Per chart review tab pt was admitted to [facility] on 04/17/16.

## 2016-04-19 DIAGNOSIS — G43909 Migraine, unspecified, not intractable, without status migrainosus: Secondary | ICD-10-CM

## 2016-04-19 DIAGNOSIS — K50012 Crohn's disease of small intestine with intestinal obstruction: Secondary | ICD-10-CM

## 2016-04-19 DIAGNOSIS — T8069XA Other serum reaction due to other serum, initial encounter: Principal | ICD-10-CM

## 2016-04-19 LAB — URINE CULTURE

## 2016-04-19 LAB — RENAL FUNCTION PANEL
Albumin: 3.3 g/dL — ABNORMAL LOW (ref 3.5–5.0)
Anion gap: 12 (ref 5–15)
BUN: 8 mg/dL (ref 6–20)
CO2: 19 mmol/L — ABNORMAL LOW (ref 22–32)
Calcium: 9 mg/dL (ref 8.9–10.3)
Chloride: 110 mmol/L (ref 101–111)
Creatinine, Ser: 0.55 mg/dL (ref 0.44–1.00)
GFR calc Af Amer: >60 mL/min (ref >60)
GFR calc non Af Amer: >60 mL/min (ref >60)
Glucose, Bld: 208 mg/dL — ABNORMAL HIGH (ref 65–99)
Phosphorus: 2.1 mg/dL — ABNORMAL LOW (ref 2.5–4.6)
Potassium: 4 mmol/L (ref 3.5–5.1)
Sodium: 141 mmol/L (ref 135–145)

## 2016-04-19 LAB — CBC WITH DIFFERENTIAL/PLATELET
Basophils Absolute: 0 10*3/uL (ref 0.0–0.1)
Basophils Relative: 0 %
Eosinophils Absolute: 0 10*3/uL (ref 0.0–0.7)
Eosinophils Relative: 0 %
HCT: 35.2 % — ABNORMAL LOW (ref 36.0–46.0)
Hemoglobin: 11.4 g/dL — ABNORMAL LOW (ref 12.0–15.0)
Lymphocytes Relative: 9 %
Lymphs Abs: 0.9 10*3/uL (ref 0.7–4.0)
MCH: 27.8 pg (ref 26.0–34.0)
MCHC: 32.4 g/dL (ref 30.0–36.0)
MCV: 85.9 fL (ref 78.0–100.0)
Monocytes Absolute: 0.1 10*3/uL (ref 0.1–1.0)
Monocytes Relative: 1 %
Neutro Abs: 9.6 10*3/uL — ABNORMAL HIGH (ref 1.7–7.7)
Neutrophils Relative %: 90 %
Platelets: 355 10*3/uL (ref 150–400)
RBC: 4.1 MIL/uL (ref 3.87–5.11)
RDW: 16.2 % — ABNORMAL HIGH (ref 11.5–15.5)
WBC: 10.7 10*3/uL — ABNORMAL HIGH (ref 4.0–10.5)

## 2016-04-19 LAB — ANTINUCLEAR ANTIBODIES, IFA: ANTINUCLEAR ANTIBODIES, IFA: NEGATIVE

## 2016-04-19 LAB — GLUCOSE, CAPILLARY: GLUCOSE-CAPILLARY: 203 mg/dL — AB (ref 65–99)

## 2016-04-19 MED ORDER — HYDROCORTISONE ACETATE 25 MG RE SUPP
25.0000 mg | Freq: Two times a day (BID) | RECTAL | Status: DC
  Filled 2016-04-19 (×3): qty 1

## 2016-04-19 MED ORDER — METHYLPREDNISOLONE SODIUM SUCC 125 MG IJ SOLR
60.0000 mg | INTRAMUSCULAR | Status: DC
  Filled 2016-04-19: qty 2

## 2016-04-19 MED ORDER — BUTALBITAL-APAP-CAFFEINE 50-325-40 MG PO TABS
2.0000 | ORAL_TABLET | Freq: Four times a day (QID) | ORAL | Status: DC | PRN
  Administered 2016-04-19 – 2016-04-20 (×3): 2 via ORAL
  Filled 2016-04-19 (×3): qty 2

## 2016-04-19 MED ORDER — SUMATRIPTAN SUCCINATE 50 MG PO TABS
50.0000 mg | ORAL_TABLET | ORAL | Status: DC | PRN
  Filled 2016-04-19: qty 1

## 2016-04-19 MED ORDER — SUMATRIPTAN SUCCINATE 6 MG/0.5ML ~~LOC~~ SOLN
6.0000 mg | Freq: Once | SUBCUTANEOUS | Status: DC
  Filled 2016-04-19: qty 0.5

## 2016-04-19 MED ORDER — BUTALBITAL-APAP-CAFFEINE 50-325-40 MG PO TABS
1.0000 | ORAL_TABLET | Freq: Once | ORAL | Status: AC
  Administered 2016-04-19: 1 via ORAL
  Filled 2016-04-19: qty 1

## 2016-04-19 NOTE — Consult Note (Signed)
[facility] Consult: 1:24 PM 04/19/2016  LOS: 1 day    Referring Provider: Dr Marthenia Rolling  Primary Care Physician:  Eliezer Lofts, MD Primary Gastroenterologist:  Dr. Havery Moros.   Previously Dr Olevia Perches.       Reason for Consultation:  Management of Crohn's in light of new dx of serum sickness following Remicade infusion.    HPI: [name] is a 40 y.o. female.  Hx ureteral stones.  Bacterial meningitis in 2004.  S/p Bil tubal ligation. Dysautonomia.  Hx PE.  Hx of stricturing and perforated ileocolonic Crohn's dz.  Initial dx 2002.  2 surgeries (ileocecectomy including appendectomy)  within first 12 months of dx. Hx abscesses and fistulas.  Hx uveitis and iritis.  In years past was treated with Remicade for 2 to 3 infusions, but it was stopped for unclear reasons.  Not clear in conferred any benefit.  Mesalamine of no benefit.  Previous treatment for ? SBBO with abx.  Medical mgt with Imuran (intolerant to 6MP) intermittently and/or steroids for many years.  05/2015 delivery of a son.  Held Imuran during pregnancy.  Hyperemesis gravidarum during pregnancy but Crohn's sxs "quiet".  Sxs flared after that and started, on 40 mg Prednisone eventually tapering to 10 mg.  Humira injections started 09/2015.  2 week course of Rifaxamin in 09/2015 for sxs c/w her previous SBBO.    Latest CT 10/26/16: patent ileo-colonic anastomosis.  No anastomotic Crohn's.  Distal sigmoid mild wall thickening and associated fat stranding sugg of chronic colitis but no evidence of acute or active Crohn's.  11/09/2015 Colonoscopy.  "showed excellent control of the Crohns, surgical anastomosis was widely patent with Rutgeert's score of i1"."  Colon healthy, no inflammatory changes. Mild loss of vascularity in sigmoid. 3 apthous ulcer at anastomosis.   Internal hemorrhoids.   Ref. Range 10/18/2015 17:19  6-MMPN Metaboilte Latest Units: pmol/8x 10E8 <180  6-TGN Metabolite Latest Units: pmol/8x 10E8 <36   Knee pains arose and pt referred to Rheumatology, Dr Amil Amen 12/20/2015.  Unclear if joint pains were from Crohns vs side effect from Humira vs  fibromyalgia vs another inflammatory arthropathy.   Pt held Humira for a month.  This led to increased stool frequency, abdominal cramping.  Her joint pains improved however.  She resumed Humira ~ 03/10/16 and joint pains resumed.  Several days post dosing has reduced GI sxs and stool frequency but eventually GI sxs accelerate to 5-10 daily loose BMs but not a lot of pain.  After finally stopping Humira her knee pain resolved.   Anemia (Ferritin 7.3, Hgb 11.2, MCV 77 in 12/2015) being treated with IV iron infusions and oral iron. Received the 1st infusion on 02/06/16, the 5th infusion on 03/05/16.  Low back pain earlier in 03/2016.  Xray 4/14 negative for sacroiliitis.  Pt initiated on Remicade, first infusion was on 04/06/16, Good Friday.  Next was set for 04/20/16.   On Saturday 4/22 developed all over, flu-like body aches. Rested during day and night and husband noted sweats, hot to touch.  4/23 developed bilateral headache and  n/v.  The headache was different from normal, unilateral headaches so she did not take home headache meds.  Went to ED. Treatment consisted of IV cocktail of  Zofran, Decadron, Benadryl, Magnesium.  Returned home and had N/V, ongoing but milder headache.  Then had LE and UE tingling, severe jaw tightness to degree unable to open mouth.  This tension and pain travelled from neck to shoulders to spine to hips.  Pain intense, very stiff.  Went to ED Tuesday PM.    Fever to 101.3.  WBC to 17.7 so sepsis protocol (vanc/cefepime initiated) and pt admitted.  LP performed and fluid non-revealing. Hospitalist diagnosis is serum sickness from Remicade.  Treatment includes the abx and IV solumedrol 60  mg q 12 hours.   Glucose has shot up to 208 today, previous hx of mild elevations in the last year.  Still having low back pain but overall pain is better.  Today has mild headache.  Has minor blood per rectum, new today.  Has about 3 BMs per day.  On bad days up to 10 loose BMs.  Stable RLQ pain that never worsened.  Tolerating solids.    Past Medical History  Diagnosis Date  . Crohn disease ([place]) 2000  . Asthma   . Endometriosis   . Dysautonomia     recurrent syncope s/p ILR by Dr Doreatha Lew  . Palpitations   . Anxiety   . GERD (gastroesophageal reflux disease)   . Vertigo   . H/O hiatal hernia   . Chest pain     a. s/p intermediate risk nuclear stress test on 09/23/14 and normal LHC on 09/24/14   . Chronic nausea   . Migraine   . Pulmonary embolism affecting pregnancy   . Anginal pain ([place]) only on 09/22/2014    Past Surgical History  Procedure Laterality Date  . Ileocecetomy  2002 X 2  . Loop recorder implant  11/2009    by DR Doreatha Lew  . Knee arthroscopy Bilateral 1988-1990  . Cesarean section  2011    emergency  CS due to placental abruption  . Partial colectomy  2002    partial removed due to crohns  . Appendectomy  ~ 1992  . Tonsillectomy  ~ 1996  . US echocardiography  11/11/2009    EF 55-60%  . Laparoscopy for ectopic pregnancy  11/2012  . Nasal sinus surgery  ~ 2007  . Endoscopic release transverse carpal ligament of hand Right 12/2010  . Colon surgery    . Cystoscopy w/ stone manipulation  X 2  . Cesarean section  2014    "preeclampsia"  . Left heart catheterization with coronary angiogram N/A 09/24/2014    Procedure: LEFT HEART CATHETERIZATION WITH CORONARY ANGIOGRAM;  Surgeon: Leonie Man, MD;  Location: [facility] CATH LAB;  Service: Cardiovascular;  Laterality: N/A;    Prior to Admission medications   Medication Sig Start Date End Date Taking? Authorizing Provider  acetaminophen (TYLENOL) 500 MG tablet Take 1,000 mg by mouth every 6 (six) hours as needed for  headache.   Yes Historical Provider, MD  albuterol (PROVENTIL HFA;VENTOLIN HFA) 108 (90 BASE) MCG/ACT inhaler Inhale 2 puffs into the lungs every 6 (six) hours as needed for wheezing or shortness of breath. 07/25/15  Yes Jearld Fenton, NP  ALPRAZolam Duanne Moron) 0.25 MG tablet TAKE 2 TABLETS BY MOUTH EVERY DAY AS NEEDED 03/06/16  Yes Amy Cletis Athens, MD  azaTHIOprine (IMURAN) 50 MG tablet Take 150 mg daily 01/23/16  Yes Renelda Loma  Armbruster, MD  butalbital-acetaminophen-caffeine (FIORICET, ESGIC) [phone] MG tablet TAKE 2 TABLETS EVERY 6 HOURS AS NEEDED 04/16/16  Yes Zenovia Jarred, DO  Cetirizine HCl (ZYRTEC ALLERGY PO) Take 1 tablet by mouth daily.    Yes Historical Provider, MD  cyanocobalamin (,VITAMIN B-12,) 1000 MCG/ML injection Inject 1,000 mcg into the muscle every 21 ( twenty-one) days.   Yes Historical Provider, MD  cyclobenzaprine (FLEXERIL) 10 MG tablet Take 1 tablet (10 mg total) by mouth at bedtime as needed for muscle spasms. 04/17/16  Yes Amy Cletis Athens, MD  dexlansoprazole (DEXILANT) 60 MG capsule Take 1 capsule (60 mg total) by mouth daily. 09/08/15  Yes Amy Cletis Athens, MD  dicyclomine (BENTYL) 20 MG tablet Take 20 mg by mouth 3 (three) times daily as needed for spasms.    Yes Historical Provider, MD  inFLIXimab in sodium chloride 0.9 % Inject into the vein every 8 (eight) weeks.   Yes Historical Provider, MD  lactobacillus acidophilus (BACID) TABS tablet Take 2 tablets by mouth 3 (three) times daily.   Yes Historical Provider, MD  ondansetron (ZOFRAN) 4 MG tablet Take 1 tablet (4 mg total) by mouth every 8 (eight) hours as needed for nausea or vomiting. 04/16/16  Yes Zenovia Jarred, DO  Prenatal Vit-Fe Fumarate-FA (PRENATAL PO) Take 2 tablets by mouth every evening. Prenatal gummy vitamin.   Yes Historical Provider, MD  ranitidine (ZANTAC) 150 MG tablet Take 1 tablet (150 mg total) by mouth every morning. 01/17/16  Yes Manus Gunning, MD  sertraline (ZOLOFT) 100 MG tablet Take 1 tablet  (100 mg total) by mouth daily. 12/28/15  Yes Amy Cletis Athens, MD  traMADol (ULTRAM) 50 MG tablet Take 50 mg by mouth every 6 (six) hours as needed for moderate pain or severe pain.   Yes Historical Provider, MD  zolpidem (AMBIEN) 10 MG tablet TAKE ONE TABLET BY MOUTH EVERY NIGHT AT BEDTIME 03/26/16  Yes Amy E Bedsole, MD  HYDROmorphone (DILAUDID) 2 MG tablet Take 1/2 to 1 tab every 4-6 hours as needed for pain. Patient not taking: Reported on 04/18/2016 10/26/15   Amy S Esterwood, PA-C    Scheduled Meds: . acidophilus  2 capsule Oral TID  . azaTHIOprine  150 mg Oral Daily  . ceFEPime (MAXIPIME) IV  2 g Intravenous Q8H  . [START ON 04/22/2016] cyanocobalamin  1,000 mcg Intramuscular Q21 days  . famotidine  20 mg Oral Daily  . loratadine  10 mg Oral Daily  . methylPREDNISolone (SOLU-MEDROL) injection  60 mg Intravenous Q12H  . pantoprazole  40 mg Oral Daily  . prenatal vitamin w/FE, FA  1 tablet Oral QPM  . sertraline  100 mg Oral Daily  . sodium chloride flush  3 mL Intravenous Q12H  . SUMAtriptan  6 mg Subcutaneous Once  . vancomycin  750 mg Intravenous Q8H  . zolpidem  5 mg Oral QHS   Infusions: . sodium chloride 1,000 mL (04/18/16 1323)   PRN Meds: acetaminophen, albuterol, ALPRAZolam, butalbital-acetaminophen-caffeine, cyclobenzaprine, dicyclomine, ondansetron, SUMAtriptan, traMADol   Allergies as of 04/17/2016 - Review Complete 04/17/2016  Allergen Reaction Noted  . Compazine [prochlorperazine edisylate] Anaphylaxis 09/15/2013  . Reglan [metoclopramide] Anaphylaxis and Other (See Comments) 09/15/2013  . Sulfamethoxazole Rash and Other (See Comments) 06/22/2014  . Biaxin [clarithromycin] Other (See Comments)   . Chlorhexidine Other (See Comments) 02/06/2016  . Codeine Nausea And Vomiting and Other (See Comments)   . Hydrocodone Nausea And Vomiting 03/13/2012  . Macrobid [nitrofurantoin] Nausea And Vomiting 10/04/2012  .  Promethazine Rash and Other (See Comments) 06/22/2014  . Sulfa  antibiotics Hives and Other (See Comments) 06/22/2014    Family History  Problem Relation Age of Onset  . Hypertension Mother   . Hyperlipidemia Mother   . Arthritis Mother   . Diabetes Father   . Coronary artery disease Father   . Heart disease Father 58    MI age 58  . Hyperlipidemia Brother   . Hypertension Brother   . Colon cancer Paternal Grandmother     Social History   Social History  . Marital Status: Married    Spouse Name: Jonni Sanger  . Number of Children: 2  . Years of Education: college   Occupational History  .  Other    Airline pilot  .  [place] History Main Topics  . Smoking status: Never Smoker   . Smokeless tobacco: Never Used  . Alcohol Use: Yes  . Drug Use: No  . Sexual Activity: Yes    Birth Control/ Protection: Surgical   Other Topics Concern  . Not on file   Social History Narrative   Patient lives at home with her husband Jonni Sanger). Patient works full time for [employer].    Education [employer].   Right handed.   Caffeine one cup of coffee daily.    REVIEW OF SYSTEMS: Constitutional:  wei ENT:  No nose bleeds Pulm:  No SOB or cough CV:  No palpitations, no LE edema.  GU:  No hematuria, no frequency GI:  Per HPI Heme:  No unusual skin or orifice bleeding.  No excessive hematoma   Transfusions:  none Neuro:  No headaches, no peripheral tingling or numbness Derm:  No itching, no rash or sores.  Endocrine:  No sweats or chills.  No polyuria or dysuria Immunization:  Reviewed.  Travel:  None beyond local counties in last few months.    PHYSICAL EXAM: Vital signs in last 24 hours: Filed Vitals:   04/18/16 2037 04/19/16 0608  BP: 115/73 125/71  Pulse: 86 57  Temp: 98.3 F (36.8 C) 98.4 F (36.9 C)  Resp: 18 16   Wt Readings from Last 3 Encounters:  04/19/16 66.316 kg (146 lb 3.2 oz)  04/16/16 64.864 kg (143 lb)  04/06/16 64.921 kg (143 lb 2 oz)    General: pleasant, comfortable.  Looks  well Head:  No swelling or asymmetry  Eyes:  No icterus or pallor Ears:  Not HOH  Nose:  No discharge or congestion Mouth:  Moist, clear Neck:  No mass, no TMG Lungs:  Clear bil   No SOB or cough Heart: RRR.  No MRG Abdomen:  Soft, ND.  No HSM.  Slight RLQ tenderness.   Rectal: deferred   Musc/Skeltl: no joint redness or swelling Extremities:  No CCE  Neurologic:  Oriented x 3.  No tremor.  No limb weakness Skin:  No telangectasia, rash or sores. Psych:  Pleasant, calm, cooperative.   Intake/Output from previous day: 04/26 0701 - 04/27 0700 In: 1060 [P.O.:360; IV Piggyback:700] Out: 2900 [Urine:2900] Intake/Output this shift: Total I/O In: 3 [I.V.:3] Out: -   LAB RESULTS:  Recent Labs  04/17/16 2345 04/18/16 0450 04/19/16 0841  WBC 17.7* 14.9* 10.7*  HGB 12.1 10.5* 11.4*  HCT 37.9 32.5* 35.2*  PLT 347 283 355   BMET Lab Results  Component Value Date   NA 141 04/19/2016   NA 139 04/18/2016   NA 141 04/17/2016   K 4.0 04/19/2016  K 3.3* 04/18/2016   K 3.7 04/17/2016   CL 110 04/19/2016   CL 112* 04/18/2016   CL 110 04/17/2016   CO2 19* 04/19/2016   CO2 18* 04/18/2016   CO2 20* 04/17/2016   GLUCOSE 208* 04/19/2016   GLUCOSE 90 04/18/2016   GLUCOSE 117* 04/17/2016   BUN 8 04/19/2016   BUN 8 04/18/2016   BUN 12 04/17/2016   CREATININE 0.55 04/19/2016   CREATININE 0.60 04/18/2016   CREATININE 0.83 04/17/2016   CALCIUM 9.0 04/19/2016   CALCIUM 7.4* 04/18/2016   CALCIUM 8.7* 04/17/2016   LFT  Recent Labs  04/17/16 2345 04/19/16 0941  PROT 6.7  --   ALBUMIN 3.6 3.3*  AST 19  --   ALT 14  --   ALKPHOS 67  --   BILITOT 0.3  --    PT/INR Lab Results  Component Value Date   INR 1.10 04/17/2016   INR 2.0 09/29/2015   INR 3.3 09/19/2015   Hepatitis Panel No results for input(s): HEPBSAG, HCVAB, HEPAIGM, HEPBIGM in the last 72 hours. C-Diff No components found for: CDIFF Lipase     Component Value Date/Time   LIPASE 25 10/06/2012 0834     Drugs of Abuse  No results found for: LABOPIA, COCAINSCRNUR, LABBENZ, AMPHETMU, THCU, LABBARB   RADIOLOGY STUDIES: Dg Chest 1 View  04/18/2016  CLINICAL DATA:  Sepsis. EXAM: CHEST 1 VIEW COMPARISON:  07/25/2015 FINDINGS: Normal heart size and mediastinal contours. Event recorder over the left chest. No acute infiltrate or edema. No effusion or pneumothorax. No osseous findings. IMPRESSION: Negative for pneumonia. Electronically Signed   By: Monte Fantasia M.D.   On: 04/18/2016 01:54   Ct Head Wo Contrast  04/18/2016  CLINICAL DATA:  Headache and fever.  Locked jaw. EXAM: CT HEAD WITHOUT CONTRAST CT NECK WITH CONTRAST TECHNIQUE: Contiguous axial images were obtained from the base of the skull through the vertex without contrast. Multidetector CT imaging of the neck was performed using the standard protocol with intravenous contrast. CONTRAST:  75 cc Isovue 300 intravenous COMPARISON:  03/13/2012 brain MRI FINDINGS: CT HEAD FINDINGS Skull and Sinuses:Negative for fracture or destructive process. The visualized mastoids, middle ears, and imaged paranasal sinuses are clear. Visualized orbits: Negative. Brain: Normal. No evidence of acute infarction, hemorrhage, hydrocephalus, or mass lesion/mass effect. CT NECK FINDINGS Negative larynx and pharynx with no suspicious asymmetry or enhancement. Normal appearance of the salivary and thyroid glands. No lymphadenopathy. Negative vascular structures. No swelling or abnormal enhancement in the muscles of mastication to explain history of locked jaw. Clear apical lungs. No lymphadenopathy in the neck or upper chest. Partial intracranial imaging is negative. Normal appearance of the orbits. No acute sinusitis or mastoiditis. IMPRESSION: Negative head and cervical spine CT.  No explanation for symptoms. Electronically Signed   By: Monte Fantasia M.D.   On: 04/18/2016 02:46   Ct Soft Tissue Neck W Contrast  04/18/2016  CLINICAL DATA:  Headache and fever.  Locked  jaw EXAM: CT HEAD WITHOUT CONTRAST CT NECK WITH CONTRAST TECHNIQUE: Contiguous axial images were obtained from the base of the skull through the vertex without contrast. Multidetector CT imaging of the neck was performed using the standard protocol with intravenous contrast. CONTRAST:  75 cc Isovue 300 intravenous COMPARISON:  None. FINDINGS: CT HEAD FINDINGS Skull and Sinuses:Negative for fracture or destructive process. The visualized mastoids, middle ears, and imaged paranasal sinuses are clear. Visualized orbits: Negative. Brain: No evidence of acute infarction, hemorrhage, hydrocephalus, or mass lesion/mass  effect. CT NECK FINDINGS Negative larynx and pharynx with no suspicious asymmetry or enhancement. Normal appearance of the salivary and thyroid glands. No lymphadenopathy. Negative vascular structures. No swelling or abnormal enhancement in the muscles of mastication to explain history of locked jaw. Clear apical lungs. No lymphadenopathy in the neck or upper chest. Partial intracranial imaging is negative. Normal appearance of the orbits. No acute sinusitis or mastoiditis. IMPRESSION: Negative head and neck CT.  No explanation for symptoms. Electronically Signed   By: Monte Fantasia M.D.   On: 04/18/2016 03:05   Mr Lumbar Spine W Wo Contrast  04/18/2016  CLINICAL DATA:  Initial evaluation for acute low back pain. EXAM: MRI LUMBAR SPINE WITHOUT AND WITH CONTRAST TECHNIQUE: Multiplanar and multiecho pulse sequences of the lumbar spine were obtained without and with intravenous contrast. CONTRAST:  56m MULTIHANCE GADOBENATE DIMEGLUMINE 529 MG/ML IV SOLN COMPARISON:  None. FINDINGS: For the purposes of this dictation, the lowest well-formed intervertebral disc spaces presumed to be the L5-S1 level, and there presumed to be 5 lumbar type vertebral bodies. Vertebral bodies are normally aligned with preservation of the normal lumbar lordosis. Vertebral body heights are well maintained. No fracture or  malalignment. Signal intensity within the vertebral body bone marrow is normal. Tiny probable hemangioma noted within the L4 vertebral body. No other focal osseous lesions. No marrow edema. No abnormal enhancement. Conus medullaris terminates normally at the L1 level. Signal intensity within the visualized cord is normal. Nerve roots of the cauda equina within normal limits. Paraspinous soft tissues within normal limits. No abnormal enhancement. L3-4: Mild degenerative disc desiccation with disc bulge. Anterior endplate osteophytic spurring. No focal disc herniation. No significant stenosis. No other significant degenerative changes identified within the lumbar spine. No other significant disc bulging or focal disc herniations. No significant facet disease. No canal or foraminal stenosis. IMPRESSION: 1. Mild degenerative disc bulge and desiccation at L3-4 without stenosis. 2. Otherwise normal MRI of the lumbar spine. No other significant degenerative changes. No canal or foraminal stenosis. Electronically Signed   By: BJeannine BogaM.D.   On: 04/18/2016 01:27    ENDOSCOPIC STUDIES: Per HPI.   IMPRESSION:   *  Ab reaction to Remicade, serum sickness.  Fevers, leukocytosis and body pain, head ache improved  *  Long standing Crohn's.  Previous ileocecectomy.  Stable BMs and RLQ pain.  Currently some minor blood PR, may be irritation or from known hemorrhoids.   *  Hyperglycemia in setting of high dose steroids.    PLAN:     *  Stop Remicade.  Continue Imuran.  Steroids for SS but does she need such high doses?  Would be good if dose can decrease given elevated blood sugars.   *  Follow up with Dr AHavery Morosor [facility] APP within a month.     *  Added BID Anusol for the rectal bleeding.    SAzucena Freed 04/19/2016, 1:24 PM Pager: [phone] GI ATTENDING  History, laboratories, x-rays, prior endoscopy reports reviewed. Patient seen and examined. Agree with above consultation note. Patient  with a history of Crohn's disease status post remote ileocecectomy. Had been on Humira. Colonoscopy in November with minimal disease at anastomosis. Felt to have side effects from Humira and recently changed to Remicade. Had Remicade exposure remotely. Has been on Imuran. Sunday developed what sounds like mild serum sickness type symptoms. Workup for significant infectious processes negative. Patient looks well on examination unremarkable. RECOMMENDATIONS: 1. Stop antibiotics 2. Discharge home tomorrow on  40 mg of prednisone to be tapered over 2 weeks 3. Office follow-up with Dr. Havery Moros in 2 weeks. May decide to observe clinically off [facility]. For breakthrough disease could consider anti-Integrin agent  Discussed with patient. Contact us for questions. Will sign off.  Docia Chuck. Geri Seminole., M.D. [facility] Division of Gastroenterology

## 2016-04-19 NOTE — Telephone Encounter (Signed)
Agree with ED

## 2016-04-19 NOTE — H&P (Signed)
PROGRESS NOTE    DELINA KRUCZEK  IOX:[id number] DOB: 1976-10-04 DOA: 04/17/2016 PCP: Eliezer Lofts, MD  Outpatient Specialists:   Brief Narrative: 40 year old Caucasian female with history of Crohn's disease, asthma, endometriosis and PE. Patient has been on Imuran for a long time, and was started on Infliximab (Remicade) about 12 days ago. Patient was admitted with multiple joint pain and stiffness, neck stiffness and fever. No rash reported. Patient has some headache prior to onset of symptoms. Patient is currently managed presumptively for possible serum sickness secondary to remicade. Procalcitonin is within normal limits. Patient fells better with steroids and supportive care. No history of exposure to ticks or tick bites. LP fluid analysis is non revealing.  Patient is still on IV antibiotics while cultures are pending.  Assessment & Plan:   Principal Problem:   Serum sickness Active Problems:   Crohn's disease (HCC)   Asthma, moderate persistent   GERD (gastroesophageal reflux disease)   Migraine   Generalized anxiety disorder   Depression, major, recurrent, moderate (HCC)   Immunosuppressed status (HCC)   Sepsis ([place])   Assessment/Plan Principal Problem:  Possible Serum sickness Active Problems:  Crohn's disease ([place]) - GI consulted. Patient is known to Dr. Havery Moros.   Migraine Headache - Start sumatriptan. Continue steroids. Fiorecet PRN.  Asthma, moderate persistent  GERD (gastroesophageal reflux disease)  Migraine  Generalized anxiety disorder  Depression, major, recurrent, moderate (HCC)  Immunosuppressed status (HCC)  SIRS. (HCC)  Serum sickness/SIRS -  Continue current management (IV steroids and antibiotics). Follow cultures. Likely DC antibiotics if cultures are non revealing, and if OK with ifnectious disease. Further management will depend on hospital course.  Crohn's disease [facility]): has been followed up by GI, Dr. Wilma Flavin. On Imuran and recently  started Remicade on 04/06/16. Patient seems to have serum sickness reaction to Remicade. Continue to hold Remicade.   Asthma: stable -continue albuterol inhaler  GERD: -Protonix and pepcid  Depression and anxiety: Stable.  DVT ppx: SCD Code Status: Full code Family Communication: Yes, patient's husband at bed side Disposition Plan: Anticipate discharge back to previous home environment Consults called: EDP discussed with ID, Dr. Baxter Flattery Admission status: Obs / tele   Procedures:   Antimicrobials:    As above.    Subjective: Reports headache, frontal with light and noise avoidance. Joint pain and stiffness have improved significantly. Likely DC once antibiotics are stopped (likely DC in am).  Objective: Filed Vitals:   04/18/16 0700 04/18/16 1230 04/18/16 2037 04/19/16 0608  BP:  104/62 115/73 125/71  Pulse:  102 86 57  Temp:  98.2 F (36.8 C) 98.3 F (36.8 C) 98.4 F (36.9 C)  TempSrc:  Oral Oral Oral  Resp:  18 18 16   Height:      Weight: 67.813 kg (149 lb 8 oz)   66.316 kg (146 lb 3.2 oz)  SpO2:  99% 98% 100%    Intake/Output Summary (Last 24 hours) at 04/19/16 1228 Last data filed at 04/19/16 0945  Gross per 24 hour  Intake   1063 ml  Output   2000 ml  Net   -937 ml   Filed Weights   04/17/16 2117 04/18/16 0700 04/19/16 0608  Weight: 65.772 kg (145 lb) 67.813 kg (149 lb 8 oz) 66.316 kg (146 lb 3.2 oz)    Examination:  General exam: Appears calm and comfortable. Not in distress. Respiratory system: Clear to auscultation. Respiratory effort normal. Cardiovascular system: S1 & S2 heard, RRR. No JVD, murmurs, rubs, gallops  or clicks. No pedal edema. Gastrointestinal system: Abdomen is nondistended, soft and nontender. No organomegaly or masses felt. Normal bowel sounds heard. Central nervous system: Alert and oriented. No focal neurological deficits. Extremities: Symmetric 5 x 5 power. Skin: No rashes, lesions or ulcers Psychiatry: Judgement and insight  appear normal. Mood & affect appropriate.     Data Reviewed: I have personally reviewed following labs and imaging studies  CBC:  Recent Labs Lab 04/17/16 2345 04/18/16 0450 04/19/16 0841  WBC 17.7* 14.9* 10.7*  NEUTROABS 14.4*  --  9.6*  HGB 12.1 10.5* 11.4*  HCT 37.9 32.5* 35.2*  MCV 88.3 87.8 85.9  PLT 347 283 782   Basic Metabolic Panel:  Recent Labs Lab 04/17/16 2345 04/18/16 0450 04/19/16 0941  NA 141 139 141  K 3.7 3.3* 4.0  CL 110 112* 110  CO2 20* 18* 19*  GLUCOSE 117* 90 208*  BUN 12 8 8   CREATININE 0.83 0.60 0.55  CALCIUM 8.7* 7.4* 9.0  PHOS  --   --  2.1*   GFR: Estimated Creatinine Clearance: 87.5 mL/min (by C-G formula based on Cr of 0.55). Liver Function Tests:  Recent Labs Lab 04/17/16 2345 04/19/16 0941  AST 19  --   ALT 14  --   ALKPHOS 67  --   BILITOT 0.3  --   PROT 6.7  --   ALBUMIN 3.6 3.3*   No results for input(s): LIPASE, AMYLASE in the last 168 hours. No results for input(s): AMMONIA in the last 168 hours. Coagulation Profile:  Recent Labs Lab 04/17/16 2345  INR 1.10   Cardiac Enzymes:  Recent Labs Lab 04/17/16 2345  TROPONINI <0.03   BNP (last 3 results) No results for input(s): PROBNP in the last 8760 hours. HbA1C: No results for input(s): HGBA1C in the last 72 hours. CBG:  Recent Labs Lab 04/18/16 1002 04/19/16 0933  GLUCAP 114* 203*   Lipid Profile: No results for input(s): CHOL, HDL, LDLCALC, TRIG, CHOLHDL, LDLDIRECT in the last 72 hours. Thyroid Function Tests: No results for input(s): TSH, T4TOTAL, FREET4, T3FREE, THYROIDAB in the last 72 hours. Anemia Panel: No results for input(s): VITAMINB12, FOLATE, FERRITIN, TIBC, IRON, RETICCTPCT in the last 72 hours. Urine analysis:    Component Value Date/Time   COLORURINE YELLOW 04/18/2016 0000   APPEARANCEUR CLOUDY* 04/18/2016 0000   LABSPEC 1.031* 04/18/2016 0000   PHURINE 6.0 04/18/2016 0000   GLUCOSEU NEGATIVE 04/18/2016 0000   HGBUR SMALL*  04/18/2016 0000   BILIRUBINUR NEGATIVE 04/18/2016 0000   BILIRUBINUR negative 10/14/2015 1652   BILIRUBINUR negative 08/20/2014 1626   KETONESUR NEGATIVE 04/18/2016 0000   KETONESUR negative 10/14/2015 1652   PROTEINUR NEGATIVE 04/18/2016 0000   PROTEINUR negative 10/14/2015 1652   PROTEINUR trace 08/20/2014 1626   UROBILINOGEN 0.2 10/14/2015 1652   UROBILINOGEN 0.2 03/20/2015 1805   NITRITE NEGATIVE 04/18/2016 0000   NITRITE Negative 10/14/2015 1652   NITRITE negative 08/20/2014 1626   LEUKOCYTESUR NEGATIVE 04/18/2016 0000   Sepsis Labs: @LABRCNTIP (procalcitonin:4,lacticidven:4)  ) Recent Results (from the past 240 hour(s))  Blood Culture (routine x 2)     Status: None (Preliminary result)   Collection Time: 04/17/16 11:45 PM  Result Value Ref Range Status   Specimen Description BLOOD LEFT ARM  Final   Special Requests BOTTLES DRAWN AEROBIC AND ANAEROBIC 5ML  Final   Culture NO GROWTH 1 DAY  Final   Report Status PENDING  Incomplete  Blood Culture (routine x 2)     Status: None (Preliminary result)  Collection Time: 04/17/16 11:55 PM  Result Value Ref Range Status   Specimen Description BLOOD RIGHT ARM  Final   Special Requests BOTTLES DRAWN AEROBIC AND ANAEROBIC 5ML  Final   Culture NO GROWTH 1 DAY  Final   Report Status PENDING  Incomplete  CSF culture     Status: None (Preliminary result)   Collection Time: 04/18/16  3:20 AM  Result Value Ref Range Status   Specimen Description CSF  Final   Special Requests NONE  Final   Gram Stain   Final    CYTOSPIN SMEAR WBC PRESENT, PREDOMINANTLY MONONUCLEAR NO ORGANISMS SEEN    Culture NO GROWTH 1 DAY  Final   Report Status PENDING  Incomplete         Radiology Studies: Dg Chest 1 View  04/18/2016  CLINICAL DATA:  Sepsis. EXAM: CHEST 1 VIEW COMPARISON:  07/25/2015 FINDINGS: Normal heart size and mediastinal contours. Event recorder over the left chest. No acute infiltrate or edema. No effusion or pneumothorax. No  osseous findings. IMPRESSION: Negative for pneumonia. Electronically Signed   By: Monte Fantasia M.D.   On: 04/18/2016 01:54   Ct Head Wo Contrast  04/18/2016  CLINICAL DATA:  Headache and fever.  Locked jaw. EXAM: CT HEAD WITHOUT CONTRAST CT NECK WITH CONTRAST TECHNIQUE: Contiguous axial images were obtained from the base of the skull through the vertex without contrast. Multidetector CT imaging of the neck was performed using the standard protocol with intravenous contrast. CONTRAST:  75 cc Isovue 300 intravenous COMPARISON:  03/13/2012 brain MRI FINDINGS: CT HEAD FINDINGS Skull and Sinuses:Negative for fracture or destructive process. The visualized mastoids, middle ears, and imaged paranasal sinuses are clear. Visualized orbits: Negative. Brain: Normal. No evidence of acute infarction, hemorrhage, hydrocephalus, or mass lesion/mass effect. CT NECK FINDINGS Negative larynx and pharynx with no suspicious asymmetry or enhancement. Normal appearance of the salivary and thyroid glands. No lymphadenopathy. Negative vascular structures. No swelling or abnormal enhancement in the muscles of mastication to explain history of locked jaw. Clear apical lungs. No lymphadenopathy in the neck or upper chest. Partial intracranial imaging is negative. Normal appearance of the orbits. No acute sinusitis or mastoiditis. IMPRESSION: Negative head and cervical spine CT.  No explanation for symptoms. Electronically Signed   By: Monte Fantasia M.D.   On: 04/18/2016 02:46   Ct Soft Tissue Neck W Contrast  04/18/2016  CLINICAL DATA:  Headache and fever.  Locked jaw EXAM: CT HEAD WITHOUT CONTRAST CT NECK WITH CONTRAST TECHNIQUE: Contiguous axial images were obtained from the base of the skull through the vertex without contrast. Multidetector CT imaging of the neck was performed using the standard protocol with intravenous contrast. CONTRAST:  75 cc Isovue 300 intravenous COMPARISON:  None. FINDINGS: CT HEAD FINDINGS Skull and  Sinuses:Negative for fracture or destructive process. The visualized mastoids, middle ears, and imaged paranasal sinuses are clear. Visualized orbits: Negative. Brain: No evidence of acute infarction, hemorrhage, hydrocephalus, or mass lesion/mass effect. CT NECK FINDINGS Negative larynx and pharynx with no suspicious asymmetry or enhancement. Normal appearance of the salivary and thyroid glands. No lymphadenopathy. Negative vascular structures. No swelling or abnormal enhancement in the muscles of mastication to explain history of locked jaw. Clear apical lungs. No lymphadenopathy in the neck or upper chest. Partial intracranial imaging is negative. Normal appearance of the orbits. No acute sinusitis or mastoiditis. IMPRESSION: Negative head and neck CT.  No explanation for symptoms. Electronically Signed   By: Monte Fantasia M.D.   On:  04/18/2016 03:05   Mr Lumbar Spine W Wo Contrast  04/18/2016  CLINICAL DATA:  Initial evaluation for acute low back pain. EXAM: MRI LUMBAR SPINE WITHOUT AND WITH CONTRAST TECHNIQUE: Multiplanar and multiecho pulse sequences of the lumbar spine were obtained without and with intravenous contrast. CONTRAST:  62m MULTIHANCE GADOBENATE DIMEGLUMINE 529 MG/ML IV SOLN COMPARISON:  None. FINDINGS: For the purposes of this dictation, the lowest well-formed intervertebral disc spaces presumed to be the L5-S1 level, and there presumed to be 5 lumbar type vertebral bodies. Vertebral bodies are normally aligned with preservation of the normal lumbar lordosis. Vertebral body heights are well maintained. No fracture or malalignment. Signal intensity within the vertebral body bone marrow is normal. Tiny probable hemangioma noted within the L4 vertebral body. No other focal osseous lesions. No marrow edema. No abnormal enhancement. Conus medullaris terminates normally at the L1 level. Signal intensity within the visualized cord is normal. Nerve roots of the cauda equina within normal limits.  Paraspinous soft tissues within normal limits. No abnormal enhancement. L3-4: Mild degenerative disc desiccation with disc bulge. Anterior endplate osteophytic spurring. No focal disc herniation. No significant stenosis. No other significant degenerative changes identified within the lumbar spine. No other significant disc bulging or focal disc herniations. No significant facet disease. No canal or foraminal stenosis. IMPRESSION: 1. Mild degenerative disc bulge and desiccation at L3-4 without stenosis. 2. Otherwise normal MRI of the lumbar spine. No other significant degenerative changes. No canal or foraminal stenosis. Electronically Signed   By: BJeannine BogaM.D.   On: 04/18/2016 01:27        Scheduled Meds: . acidophilus  2 capsule Oral TID  . azaTHIOprine  150 mg Oral Daily  . ceFEPime (MAXIPIME) IV  2 g Intravenous Q8H  . [START ON 04/22/2016] cyanocobalamin  1,000 mcg Intramuscular Q21 days  . famotidine  20 mg Oral Daily  . loratadine  10 mg Oral Daily  . methylPREDNISolone (SOLU-MEDROL) injection  60 mg Intravenous Q12H  . pantoprazole  40 mg Oral Daily  . prenatal vitamin w/FE, FA  1 tablet Oral QPM  . sertraline  100 mg Oral Daily  . sodium chloride flush  3 mL Intravenous Q12H  . SUMAtriptan  6 mg Subcutaneous Once  . vancomycin  750 mg Intravenous Q8H  . zolpidem  5 mg Oral QHS   Continuous Infusions: . sodium chloride 1,000 mL (04/18/16 1323)     LOS: 1 day    Time spent: 30 minutes.    SBonnell Public MD [facility] Pager [phone]  If 7PM-7AM, please contact night-coverage [email or web address] Password [email or web address]/27/2017, 12:28 PM

## 2016-04-19 NOTE — Progress Notes (Addendum)
Pt a/o, c/o migraine that was worse than previous, MD notified, PRN Fioricet given as ordered, pt stated she still has HA but slightly better, VSS, pt stable

## 2016-04-20 ENCOUNTER — Encounter (HOSPITAL_COMMUNITY): Payer: BC Managed Care – PPO

## 2016-04-20 DIAGNOSIS — K50019 Crohn's disease of small intestine with unspecified complications: Secondary | ICD-10-CM

## 2016-04-20 DIAGNOSIS — G43009 Migraine without aura, not intractable, without status migrainosus: Secondary | ICD-10-CM

## 2016-04-20 DIAGNOSIS — D899 Disorder involving the immune mechanism, unspecified: Secondary | ICD-10-CM

## 2016-04-20 LAB — CBC
HCT: 31.4 % — ABNORMAL LOW (ref 36.0–46.0)
Hemoglobin: 10 g/dL — ABNORMAL LOW (ref 12.0–15.0)
MCH: 27.8 pg (ref 26.0–34.0)
MCHC: 31.8 g/dL (ref 30.0–36.0)
MCV: 87.2 fL (ref 78.0–100.0)
Platelets: 348 10*3/uL (ref 150–400)
RBC: 3.6 MIL/uL — ABNORMAL LOW (ref 3.87–5.11)
RDW: 16.2 % — ABNORMAL HIGH (ref 11.5–15.5)
WBC: 8.7 10*3/uL (ref 4.0–10.5)

## 2016-04-20 LAB — URINALYSIS, ROUTINE W REFLEX MICROSCOPIC
Bilirubin Urine: NEGATIVE
Glucose, UA: NEGATIVE mg/dL
Hgb urine dipstick: NEGATIVE
Ketones, ur: NEGATIVE mg/dL
Leukocytes, UA: NEGATIVE
Nitrite: NEGATIVE
Protein, ur: NEGATIVE mg/dL
Specific Gravity, Urine: 1.026 (ref 1.005–1.030)
pH: 6.5 (ref 5.0–8.0)

## 2016-04-20 LAB — GLUCOSE, CAPILLARY: GLUCOSE-CAPILLARY: 106 mg/dL — AB (ref 65–99)

## 2016-04-20 MED ORDER — HYDROCORTISONE ACETATE 25 MG RE SUPP: 25.0000 mg | Freq: Two times a day (BID) | RECTAL | Status: DC

## 2016-04-20 MED ORDER — PREDNISONE 10 MG PO TABS: ORAL_TABLET | ORAL | Status: DC

## 2016-04-20 NOTE — Progress Notes (Signed)
1200 Pt complained of recurring headace slightly relieved by medication . Referred to Dr.Agato. Called and spoken to him . Will discuss condition and  Plan of care with her in the room. Seen and evaluated by NP radiology .  Pt stayed bedrest . Increases po fluids . done Dr Patsy Lager also discusss plan of care tom pt and souse . Pt and spouse amenable to go be discharged / with discharge instruction

## 2016-04-20 NOTE — Consult Note (Signed)
Chief Complaint: Patient was seen in consultation today for  Possible blood patch Chief Complaint  Patient presents with  . Generalized Body Aches  . Joint Pain   at the request of Dr Dana Allan  Referring Physician(s): Dr Dana Allan  Supervising Physician: Marybelle Killings  Patient Status: In-pt   History of Present Illness: [name] is a 40 y.o. female   Pt with Hx Chron's; asthma; endometriosis Hx PE---(off lovenox now 6 mo) Admitted 4/25 from ED with joint pains; neck stiffness; fever Headache even before admission LP 4/25 in ED (all neg) Now with positional headache  She has had LP before and required blood patch then  Pt states she is experiencing headache especially when sits or stands up;  Even to raise head of bed headache worsens Lying down headache subsides almost totally Noted since LP in ED Denies N/V; no issues with light Ambulates around room; sitting up now to eat Has not had any flat bed rest as of yet  Request for possible blood patch I have seen and examined pt  Rec: flat bed rest x 48-72 hrs Push fluids---esp caffeine Only up to restroom----even eating must be flat Re evaluate Mon/Tue Recheck ua per Dr Barbie Banner   Past Medical History  Diagnosis Date  . Crohn disease ([place]) 2000  . Asthma   . Endometriosis   . Dysautonomia     recurrent syncope s/p ILR by Dr Doreatha Lew  . Palpitations   . Anxiety   . GERD (gastroesophageal reflux disease)   . Vertigo   . H/O hiatal hernia   . Chest pain     a. s/p intermediate risk nuclear stress test on 09/23/14 and normal LHC on 09/24/14   . Chronic nausea   . Migraine   . Pulmonary embolism affecting pregnancy   . Anginal pain ([place]) only on 09/22/2014    Past Surgical History  Procedure Laterality Date  . Ileocecetomy  2002 X 2  . Loop recorder implant  11/2009    by DR Doreatha Lew  . Knee arthroscopy Bilateral 1988-1990  . Cesarean section  2011    emergency  CS due to placental  abruption  . Partial colectomy  2002    partial removed due to crohns  . Appendectomy  ~ 1992  . Tonsillectomy  ~ 1996  . US echocardiography  11/11/2009    EF 55-60%  . Laparoscopy for ectopic pregnancy  11/2012  . Nasal sinus surgery  ~ 2007  . Endoscopic release transverse carpal ligament of hand Right 12/2010  . Colon surgery    . Cystoscopy w/ stone manipulation  X 2  . Cesarean section  2014    "preeclampsia"  . Left heart catheterization with coronary angiogram N/A 09/24/2014    Procedure: LEFT HEART CATHETERIZATION WITH CORONARY ANGIOGRAM;  Surgeon: Leonie Man, MD;  Location: [facility] CATH LAB;  Service: Cardiovascular;  Laterality: N/A;    Allergies: Compazine; Reglan; Sulfamethoxazole; Biaxin; Chlorhexidine; Codeine; Hydrocodone; Macrobid; Other; Promethazine; and Sulfa antibiotics  Medications: Prior to Admission medications   Medication Sig Start Date End Date Taking? Authorizing Provider  acetaminophen (TYLENOL) 500 MG tablet Take 1,000 mg by mouth every 6 (six) hours as needed for headache.   Yes Historical Provider, MD  albuterol (PROVENTIL HFA;VENTOLIN HFA) 108 (90 BASE) MCG/ACT inhaler Inhale 2 puffs into the lungs every 6 (six) hours as needed for wheezing or shortness of breath. 07/25/15  Yes Jearld Fenton, NP  ALPRAZolam Duanne Moron) 0.25 MG tablet  TAKE 2 TABLETS BY MOUTH EVERY DAY AS NEEDED 03/06/16  Yes Amy Cletis Athens, MD  azaTHIOprine (IMURAN) 50 MG tablet Take 150 mg daily 01/23/16  Yes Manus Gunning, MD  butalbital-acetaminophen-caffeine (FIORICET, ESGIC) [phone] MG tablet TAKE 2 TABLETS EVERY 6 HOURS AS NEEDED 04/16/16  Yes Zenovia Jarred, DO  Cetirizine HCl (ZYRTEC ALLERGY PO) Take 1 tablet by mouth daily.    Yes Historical Provider, MD  cyanocobalamin (,VITAMIN B-12,) 1000 MCG/ML injection Inject 1,000 mcg into the muscle every 21 ( twenty-one) days.   Yes Historical Provider, MD  cyclobenzaprine (FLEXERIL) 10 MG tablet Take 1 tablet (10 mg total) by mouth at  bedtime as needed for muscle spasms. 04/17/16  Yes Amy Cletis Athens, MD  dexlansoprazole (DEXILANT) 60 MG capsule Take 1 capsule (60 mg total) by mouth daily. 09/08/15  Yes Amy Cletis Athens, MD  dicyclomine (BENTYL) 20 MG tablet Take 20 mg by mouth 3 (three) times daily as needed for spasms.    Yes Historical Provider, MD  inFLIXimab in sodium chloride 0.9 % Inject into the vein every 8 (eight) weeks.   Yes Historical Provider, MD  lactobacillus acidophilus (BACID) TABS tablet Take 2 tablets by mouth 3 (three) times daily.   Yes Historical Provider, MD  ondansetron (ZOFRAN) 4 MG tablet Take 1 tablet (4 mg total) by mouth every 8 (eight) hours as needed for nausea or vomiting. 04/16/16  Yes Zenovia Jarred, DO  Prenatal Vit-Fe Fumarate-FA (PRENATAL PO) Take 2 tablets by mouth every evening. Prenatal gummy vitamin.   Yes Historical Provider, MD  ranitidine (ZANTAC) 150 MG tablet Take 1 tablet (150 mg total) by mouth every morning. 01/17/16  Yes Manus Gunning, MD  sertraline (ZOLOFT) 100 MG tablet Take 1 tablet (100 mg total) by mouth daily. 12/28/15  Yes Amy Cletis Athens, MD  traMADol (ULTRAM) 50 MG tablet Take 50 mg by mouth every 6 (six) hours as needed for moderate pain or severe pain.   Yes Historical Provider, MD  zolpidem (AMBIEN) 10 MG tablet TAKE ONE TABLET BY MOUTH EVERY NIGHT AT BEDTIME 03/26/16  Yes Amy E Bedsole, MD  HYDROmorphone (DILAUDID) 2 MG tablet Take 1/2 to 1 tab every 4-6 hours as needed for pain. Patient not taking: Reported on 04/18/2016 10/26/15   Amy S Esterwood, PA-C     Family History  Problem Relation Age of Onset  . Hypertension Mother   . Hyperlipidemia Mother   . Arthritis Mother   . Diabetes Father   . Coronary artery disease Father   . Heart disease Father 44    MI age 79  . Hyperlipidemia Brother   . Hypertension Brother   . Colon cancer Paternal Grandmother     Social History   Social History  . Marital Status: Married    Spouse Name: Jonni Sanger  . Number of  Children: 2  . Years of Education: college   Occupational History  .  Other    Airline pilot  .  [place] History Main Topics  . Smoking status: Never Smoker   . Smokeless tobacco: Never Used  . Alcohol Use: Yes  . Drug Use: No  . Sexual Activity: Yes    Birth Control/ Protection: Surgical   Other Topics Concern  . None   Social History Narrative   Patient lives at home with her husband Jonni Sanger). Patient works full time for [employer].    Education [employer].   Right handed.  Caffeine one cup of coffee daily.     Review of Systems: A 12 point ROS discussed and pertinent positives are indicated in the HPI above.  All other systems are negative.  Review of Systems  Constitutional: Positive for activity change. Negative for fever, appetite change and fatigue.  HENT: Negative for tinnitus.   Gastrointestinal: Negative for abdominal pain.  Genitourinary: Negative for dysuria, hematuria and difficulty urinating.  Musculoskeletal: Negative for back pain, gait problem and neck stiffness.  Neurological: Positive for headaches. Negative for dizziness, speech difficulty, weakness and light-headedness.  Psychiatric/Behavioral: Negative for behavioral problems and confusion.    Vital Signs: BP 130/57 mmHg  Pulse 63  Temp(Src) 97.6 F (36.4 C) (Oral)  Resp 18  Ht 5' 4"  (1.626 m)  Wt 144 lb 9.6 oz (65.59 kg)  BMI 24.81 kg/m2  SpO2 100%  LMP 04/11/2016  Physical Exam  Constitutional: She is oriented to person, place, and time.  Cardiovascular: Normal heart sounds.   Pulmonary/Chest: Effort normal and breath sounds normal.  Musculoskeletal: Normal range of motion.  Neurological: She is alert and oriented to person, place, and time.  Skin: Skin is warm and dry.  Psychiatric: She has a normal mood and affect. Her behavior is normal. Judgment and thought content normal.  Nursing note and vitals reviewed.   Mallampati Score:  MD  Evaluation Airway: WNL Heart: WNL Abdomen: WNL Chest/ Lungs: WNL ASA  Classification: 2 Mallampati/Airway Score: One  Imaging: Dg Chest 1 View  04/18/2016  CLINICAL DATA:  Sepsis. EXAM: CHEST 1 VIEW COMPARISON:  07/25/2015 FINDINGS: Normal heart size and mediastinal contours. Event recorder over the left chest. No acute infiltrate or edema. No effusion or pneumothorax. No osseous findings. IMPRESSION: Negative for pneumonia. Electronically Signed   By: Monte Fantasia M.D.   On: 04/18/2016 01:54   Dg Si Joints  04/06/2016  CLINICAL DATA:  Bilateral posterior hip pain radiating to the coccyx ; difficulty sitting, no known injury. EXAM: BILATERAL SACROILIAC JOINTS - 3+ VIEW COMPARISON:  Coronal and sagittal reconstructed images through the pelvis from an abdominal and pelvic CT scan dated October 27, 2015 FINDINGS: The sacrum is adequately mineralized. There are at least 3 intact sacral struts observed. The SI joints are normal in width. No bony ankylosis or erosive changes are observed. The appearance of the coccyx is normal where visualized. IMPRESSION: There is no acute or significant chronic bony abnormality of the sacrum nor of the SI joints. Electronically Signed   By: David  Martinique M.D.   On: 04/06/2016 12:38   Ct Head Wo Contrast  04/18/2016  CLINICAL DATA:  Headache and fever.  Locked jaw. EXAM: CT HEAD WITHOUT CONTRAST CT NECK WITH CONTRAST TECHNIQUE: Contiguous axial images were obtained from the base of the skull through the vertex without contrast. Multidetector CT imaging of the neck was performed using the standard protocol with intravenous contrast. CONTRAST:  75 cc Isovue 300 intravenous COMPARISON:  03/13/2012 brain MRI FINDINGS: CT HEAD FINDINGS Skull and Sinuses:Negative for fracture or destructive process. The visualized mastoids, middle ears, and imaged paranasal sinuses are clear. Visualized orbits: Negative. Brain: Normal. No evidence of acute infarction, hemorrhage,  hydrocephalus, or mass lesion/mass effect. CT NECK FINDINGS Negative larynx and pharynx with no suspicious asymmetry or enhancement. Normal appearance of the salivary and thyroid glands. No lymphadenopathy. Negative vascular structures. No swelling or abnormal enhancement in the muscles of mastication to explain history of locked jaw. Clear apical lungs. No lymphadenopathy in the neck or upper chest. Partial  intracranial imaging is negative. Normal appearance of the orbits. No acute sinusitis or mastoiditis. IMPRESSION: Negative head and cervical spine CT.  No explanation for symptoms. Electronically Signed   By: Monte Fantasia M.D.   On: 04/18/2016 02:46   Ct Soft Tissue Neck W Contrast  04/18/2016  CLINICAL DATA:  Headache and fever.  Locked jaw EXAM: CT HEAD WITHOUT CONTRAST CT NECK WITH CONTRAST TECHNIQUE: Contiguous axial images were obtained from the base of the skull through the vertex without contrast. Multidetector CT imaging of the neck was performed using the standard protocol with intravenous contrast. CONTRAST:  75 cc Isovue 300 intravenous COMPARISON:  None. FINDINGS: CT HEAD FINDINGS Skull and Sinuses:Negative for fracture or destructive process. The visualized mastoids, middle ears, and imaged paranasal sinuses are clear. Visualized orbits: Negative. Brain: No evidence of acute infarction, hemorrhage, hydrocephalus, or mass lesion/mass effect. CT NECK FINDINGS Negative larynx and pharynx with no suspicious asymmetry or enhancement. Normal appearance of the salivary and thyroid glands. No lymphadenopathy. Negative vascular structures. No swelling or abnormal enhancement in the muscles of mastication to explain history of locked jaw. Clear apical lungs. No lymphadenopathy in the neck or upper chest. Partial intracranial imaging is negative. Normal appearance of the orbits. No acute sinusitis or mastoiditis. IMPRESSION: Negative head and neck CT.  No explanation for symptoms. Electronically Signed    By: Monte Fantasia M.D.   On: 04/18/2016 03:05   Mr Lumbar Spine W Wo Contrast  04/18/2016  CLINICAL DATA:  Initial evaluation for acute low back pain. EXAM: MRI LUMBAR SPINE WITHOUT AND WITH CONTRAST TECHNIQUE: Multiplanar and multiecho pulse sequences of the lumbar spine were obtained without and with intravenous contrast. CONTRAST:  75m MULTIHANCE GADOBENATE DIMEGLUMINE 529 MG/ML IV SOLN COMPARISON:  None. FINDINGS: For the purposes of this dictation, the lowest well-formed intervertebral disc spaces presumed to be the L5-S1 level, and there presumed to be 5 lumbar type vertebral bodies. Vertebral bodies are normally aligned with preservation of the normal lumbar lordosis. Vertebral body heights are well maintained. No fracture or malalignment. Signal intensity within the vertebral body bone marrow is normal. Tiny probable hemangioma noted within the L4 vertebral body. No other focal osseous lesions. No marrow edema. No abnormal enhancement. Conus medullaris terminates normally at the L1 level. Signal intensity within the visualized cord is normal. Nerve roots of the cauda equina within normal limits. Paraspinous soft tissues within normal limits. No abnormal enhancement. L3-4: Mild degenerative disc desiccation with disc bulge. Anterior endplate osteophytic spurring. No focal disc herniation. No significant stenosis. No other significant degenerative changes identified within the lumbar spine. No other significant disc bulging or focal disc herniations. No significant facet disease. No canal or foraminal stenosis. IMPRESSION: 1. Mild degenerative disc bulge and desiccation at L3-4 without stenosis. 2. Otherwise normal MRI of the lumbar spine. No other significant degenerative changes. No canal or foraminal stenosis. Electronically Signed   By: BJeannine BogaM.D.   On: 04/18/2016 01:27    Labs:  CBC:  Recent Labs  04/17/16 2345 04/18/16 0450 04/19/16 0841 04/20/16 0304  WBC 17.7* 14.9*  10.7* 8.7  HGB 12.1 10.5* 11.4* 10.0*  HCT 37.9 32.5* 35.2* 31.4*  PLT 347 283 355 348    COAGS:  Recent Labs  09/08/15 1631 09/19/15 1333 09/29/15 1646 04/17/16 2345  INR 3.2 3.3 2.0 1.10  APTT  --   --   --  28    BMP:  Recent Labs  01/17/16 1643 04/17/16 2345 04/18/16 0450 04/19/16  0941  NA 139 141 139 141  K 3.8 3.7 3.3* 4.0  CL 104 110 112* 110  CO2 28 20* 18* 19*  GLUCOSE 95 117* 90 208*  BUN 13 12 8 8   CALCIUM 8.7 8.7* 7.4* 9.0  CREATININE 0.59 0.83 0.60 0.55  GFRNONAA  --  >60 >60 >60  GFRAA  --  >60 >60 >60    LIVER FUNCTION TESTS:  Recent Labs  10/18/15 1719 01/17/16 1643 03/12/16 1650 04/17/16 2345 04/19/16 0941  BILITOT 0.2 0.2 0.3 0.3  --   AST 15 13 13 19   --   ALT 16 11 11 14   --   ALKPHOS 76 99 71 67  --   PROT 6.7 6.9 6.8 6.7  --   ALBUMIN 3.9 4.3 4.4 3.6 3.3*    TUMOR MARKERS: No results for input(s): AFPTM, CEA, CA199, CHROMGRNA in the last 8760 hours.  Assessment and Plan:  Admitted from ED with headache; fever; joint aches LP in ED 4/25 New onset positional headache since then Rec: flat bedrest x 48-72 hrs Even to eat---must lay down May get up to restroom Re evaluate Mon or Tue---determine if needs blood patch May be able to heal on own with proper treatment Pt aware and agreeable  Can be performed as OP, if needed, if MD feels appropriate  Thank you for this interesting consult.  I greatly enjoyed meeting [name] and look forward to participating in their care.  A copy of this report was sent to the requesting provider on this date.  Electronically Signed: Monia Sabal A 04/20/2016, 2:04 PM   I spent a total of 20 minutes    in face to face in clinical consultation, greater than 50% of which was counseling/coordinating care for possible blood patch

## 2016-04-20 NOTE — Discharge Summary (Signed)
Physician Discharge Summary  Patient ID: [name] MRN: [id number] DOB/AGE: 05/13/1976 40 y.o.  Admit date: 04/17/2016 Discharge date: 04/20/2016  Admission Diagnoses:  Discharge Diagnoses:  Principal Problem:   Serum sickness Active Problems:   Headache secondary to Migraine and/or Spinal leak   Crohn's disease (HCC)   Asthma, moderate persistent   GERD (gastroesophageal reflux disease)   Migraine   Generalized anxiety disorder   Depression, major, recurrent, moderate (HCC)   Immunosuppressed status ([place])   Sepsis ([place])   Discharged Condition: Stable.  Hospital Course: 40 y.o. female with medical history significant of Crohn's disease, asthma, GERD, depression, anxiety, endometriosis, vertigo, migraine headache, pulmonary embolism, who presents with a diffuse to joint pain, body aching and fever. Patient has been on Imuran since the beginning of March. She was newly started with Remicade on 04/06/16, but may have had remote exposure to Remicade. Patient started having severe headache a day prior to presentation and was seen in the emergency room. She was treated for migraine headache and discharged home. Patient developed diffuse joint pain, body ache and fever on the morning of admission. Patient does not have any rashes. She has chronic diarrhea due to Crohn's disease, which has not changed in nature. Patient was admitted and managed for possible serum sickness secondary to Remicade. Patient was managed with IV steroids. Patient was initially started on antibiotics while being worked up, but this was eventually discontinued. Patient underwent Lumbar puncture as well, but the count was non revealing. Patient developed another migraine like headache that was managed with Fioricet with significant improvement. Prior to discharge, patient reported what seems to be headache secondary to Spinal fluid leak. Interventional Radiology consult was called for possible blood patch. Patient's  clinical course was discussed with Dr. Maryclare Bean, Interventional Radiologist. Due to cloudy urine noted on admission, blood patch was put on hold until UTI is ruled out. Urine has been sent for urinalysis and urine culture. Meanwhile, patient has been advised to lay in bed over the weekend, and to get in touch with Interventional Radiology team on Monday (3 days) if headache persists. Hopefully, the urine culture result will be available in 48 hours. Patient will be discharged to the care of the PCP.  Consults: Gastroenterology. Interventional Radiology. ID assisted in directing antibiotics. Significant Diagnostic Studies:   Discharge medication - See Med. Rec. Treatments:  Discharge Exam: Blood pressure 130/57, pulse 63, temperature 97.6 F (36.4 C), temperature source Oral, resp. rate 18, height 5' 4"  (1.626 m), weight 65.59 kg (144 lb 9.6 oz), last menstrual period 04/11/2016, SpO2 100 %, currently breastfeeding.   General exam: Calm and comfortable. Not in distress. Respiratory system: Clear to auscultation. Respiratory effort normal. Cardiovascular system: S1 & S2 heard, RRR. No JVD, murmurs, rubs, gallops or clicks. No pedal edema. Gastrointestinal system: Abdomen is nondistended, soft and nontender. No organomegaly or masses felt. Normal bowel sounds heard. Central nervous system: Alert and oriented. No focal neurological deficits. Extremities: Symmetric 5 x 5 power. Skin: No rashes, lesions or ulcers Psychiatry: Judgement and insight appear normal. Mood & affect appropriate.    Disposition: 01-Home or Self Care  Discharge Instructions    Diet - low sodium heart healthy    Complete by:  As directed      Discharge instructions    Complete by:  As directed   Call MD if symptoms reoccur     Increase activity slowly    Complete by:  As directed   Lay in bed while  experiencing Headache. Call Interventional Radiology team (Dr. Toni Amend Hoss) if headache persists by monday             Medication List    STOP taking these medications        HYDROmorphone 2 MG tablet  Commonly known as:  DILAUDID     inFLIXimab in sodium chloride 0.9 %     traMADol 50 MG tablet  Commonly known as:  ULTRAM      TAKE these medications        acetaminophen 500 MG tablet  Commonly known as:  TYLENOL  Take 1,000 mg by mouth every 6 (six) hours as needed for headache.     albuterol 108 (90 Base) MCG/ACT inhaler  Commonly known as:  PROVENTIL HFA;VENTOLIN HFA  Inhale 2 puffs into the lungs every 6 (six) hours as needed for wheezing or shortness of breath.     ALPRAZolam 0.25 MG tablet  Commonly known as:  XANAX  TAKE 2 TABLETS BY MOUTH EVERY DAY AS NEEDED     azaTHIOprine 50 MG tablet  Commonly known as:  IMURAN  Take 150 mg daily     butalbital-acetaminophen-caffeine 50-325-40 MG tablet  Commonly known as:  FIORICET, ESGIC  TAKE 2 TABLETS EVERY 6 HOURS AS NEEDED     cyanocobalamin 1000 MCG/ML injection  Commonly known as:  (VITAMIN B-12)  Inject 1,000 mcg into the muscle every 21 ( twenty-one) days.     cyclobenzaprine 10 MG tablet  Commonly known as:  FLEXERIL  Take 1 tablet (10 mg total) by mouth at bedtime as needed for muscle spasms.     dexlansoprazole 60 MG capsule  Commonly known as:  DEXILANT  Take 1 capsule (60 mg total) by mouth daily.     dicyclomine 20 MG tablet  Commonly known as:  BENTYL  Take 20 mg by mouth 3 (three) times daily as needed for spasms.     hydrocortisone 25 MG suppository  Commonly known as:  ANUSOL-HC  Place 1 suppository (25 mg total) rectally 2 (two) times daily.     lactobacillus acidophilus Tabs tablet  Take 2 tablets by mouth 3 (three) times daily.     ondansetron 4 MG tablet  Commonly known as:  ZOFRAN  Take 1 tablet (4 mg total) by mouth every 8 (eight) hours as needed for nausea or vomiting.     PRENATAL PO  Take 2 tablets by mouth every evening. Prenatal gummy vitamin.     ranitidine 150 MG tablet  Commonly known  as:  ZANTAC  Take 1 tablet (150 mg total) by mouth every morning.     sertraline 100 MG tablet  Commonly known as:  ZOLOFT  Take 1 tablet (100 mg total) by mouth daily.     zolpidem 10 MG tablet  Commonly known as:  AMBIEN  TAKE ONE TABLET BY MOUTH EVERY NIGHT AT BEDTIME     ZYRTEC ALLERGY PO  Take 1 tablet by mouth daily.           Follow-up Information    Follow up with Manus Gunning, MD On 05/11/2016.   Specialty:  Gastroenterology   Why:  2:45 PM for Crohn's follow up.     Contact information:   [address] [place] [address] [phone]       Follow up with Eliezer Lofts, MD In 3 days.   Specialty:  Family Medicine   Why:  Post hospitalization   Contact information:  [place] [address] [phone]       Follow up with art hoss In 3 days.      Follow up with Eliezer Lofts, MD In 3 days.   Specialty:  Family Medicine   Contact information:   [address] [place] [address] [phone]       Signed: Bonnell Public 04/20/2016, 2:57 PM

## 2016-04-20 NOTE — Progress Notes (Signed)
1737 Discharge instructions given . Verbalized understanding. Wheeled to lobby by NT . No complaint presented. No apparent distress noted.

## 2016-04-21 ENCOUNTER — Telehealth: Payer: Self-pay | Admitting: Internal Medicine

## 2016-04-21 LAB — CSF CULTURE: CULTURE: NO GROWTH

## 2016-04-21 LAB — CSF CULTURE W GRAM STAIN

## 2016-04-21 LAB — HSV(HERPES SMPLX VRS)ABS-I+II(IGG)-CSF

## 2016-04-21 NOTE — Telephone Encounter (Signed)
The patient was admitted to the hospital this week with possible mild serum sickness reaction to Remicade. One of her presenting symptoms with severe headache (patient does have history of chronic migraines) with stiffness for which lumbar puncture was performed.This was negative. Patient tells me that she had a leak and was offered a patch but declined. Now having headache. Wants advice. I told her that this is well out of my area of expertise. I advised that she contact her neurologist for advice or go to the hospital ER if symptoms are severe.

## 2016-04-22 ENCOUNTER — Encounter (HOSPITAL_COMMUNITY): Payer: Self-pay | Admitting: Emergency Medicine

## 2016-04-22 ENCOUNTER — Observation Stay (HOSPITAL_COMMUNITY)
Admission: EM | Admit: 2016-04-22 | Discharge: 2016-04-24 | Disposition: A | Discharge status: Home / Self Care | Payer: BC Managed Care – PPO | Attending: Internal Medicine | Admitting: Internal Medicine

## 2016-04-22 ENCOUNTER — Inpatient Hospital Stay (HOSPITAL_COMMUNITY): Payer: BC Managed Care – PPO

## 2016-04-22 DIAGNOSIS — Z9049 Acquired absence of other specified parts of digestive tract: Secondary | ICD-10-CM | POA: Insufficient documentation

## 2016-04-22 DIAGNOSIS — K5000 Crohn's disease of small intestine without complications: Secondary | ICD-10-CM | POA: Diagnosis not present

## 2016-04-22 DIAGNOSIS — T8069XA Other serum reaction due to other serum, initial encounter: Secondary | ICD-10-CM | POA: Diagnosis not present

## 2016-04-22 DIAGNOSIS — G971 Other reaction to spinal and lumbar puncture: Secondary | ICD-10-CM

## 2016-04-22 DIAGNOSIS — T50Z95A Adverse effect of other vaccines and biological substances, initial encounter: Secondary | ICD-10-CM | POA: Insufficient documentation

## 2016-04-22 DIAGNOSIS — F419 Anxiety disorder, unspecified: Secondary | ICD-10-CM | POA: Insufficient documentation

## 2016-04-22 DIAGNOSIS — Y848 Other medical procedures as the cause of abnormal reaction of the patient, or of later complication, without mention of misadventure at the time of the procedure: Secondary | ICD-10-CM | POA: Diagnosis not present

## 2016-04-22 DIAGNOSIS — G9600 Cerebrospinal fluid leak, unspecified: Secondary | ICD-10-CM | POA: Diagnosis present

## 2016-04-22 DIAGNOSIS — K5090 Crohn's disease, unspecified, without complications: Secondary | ICD-10-CM | POA: Diagnosis present

## 2016-04-22 DIAGNOSIS — K219 Gastro-esophageal reflux disease without esophagitis: Secondary | ICD-10-CM | POA: Insufficient documentation

## 2016-04-22 DIAGNOSIS — Z79899 Other long term (current) drug therapy: Secondary | ICD-10-CM | POA: Insufficient documentation

## 2016-04-22 DIAGNOSIS — Z86711 Personal history of pulmonary embolism: Secondary | ICD-10-CM | POA: Diagnosis not present

## 2016-04-22 DIAGNOSIS — J45909 Unspecified asthma, uncomplicated: Secondary | ICD-10-CM | POA: Diagnosis not present

## 2016-04-22 DIAGNOSIS — F329 Major depressive disorder, single episode, unspecified: Secondary | ICD-10-CM | POA: Insufficient documentation

## 2016-04-22 DIAGNOSIS — D72829 Elevated white blood cell count, unspecified: Secondary | ICD-10-CM | POA: Insufficient documentation

## 2016-04-22 DIAGNOSIS — R519 Headache, unspecified: Secondary | ICD-10-CM | POA: Diagnosis present

## 2016-04-22 DIAGNOSIS — R51 Headache: Secondary | ICD-10-CM | POA: Diagnosis present

## 2016-04-22 LAB — URINALYSIS, ROUTINE W REFLEX MICROSCOPIC
Bilirubin Urine: NEGATIVE
Glucose, UA: NEGATIVE mg/dL
Hgb urine dipstick: NEGATIVE
Ketones, ur: NEGATIVE mg/dL
LEUKOCYTES UA: NEGATIVE
NITRITE: NEGATIVE
PH: 6.5 (ref 5.0–8.0)
Protein, ur: NEGATIVE mg/dL
SPECIFIC GRAVITY, URINE: 1.028 (ref 1.005–1.030)

## 2016-04-22 LAB — CBC WITH DIFFERENTIAL/PLATELET
BASOS ABS: 0 10*3/uL (ref 0.0–0.1)
BASOS PCT: 0 %
Eosinophils Absolute: 0.1 10*3/uL (ref 0.0–0.7)
Eosinophils Relative: 1 %
HEMATOCRIT: 38.5 % (ref 36.0–46.0)
HEMOGLOBIN: 13 g/dL (ref 12.0–15.0)
Lymphocytes Relative: 31 %
Lymphs Abs: 2.7 10*3/uL (ref 0.7–4.0)
MCH: 28.2 pg (ref 26.0–34.0)
MCHC: 33.8 g/dL (ref 30.0–36.0)
MCV: 83.5 fL (ref 78.0–100.0)
MONO ABS: 0.6 10*3/uL (ref 0.1–1.0)
Monocytes Relative: 7 %
NEUTROS ABS: 5.4 10*3/uL (ref 1.7–7.7)
NEUTROS PCT: 61 %
Platelets: 506 10*3/uL — ABNORMAL HIGH (ref 150–400)
RBC: 4.61 MIL/uL (ref 3.87–5.11)
RDW: 16 % — ABNORMAL HIGH (ref 11.5–15.5)
WBC: 8.8 10*3/uL (ref 4.0–10.5)

## 2016-04-22 LAB — COMPREHENSIVE METABOLIC PANEL
ALK PHOS: 75 U/L (ref 38–126)
ALT: 34 U/L (ref 14–54)
ANION GAP: 10 (ref 5–15)
AST: 30 U/L (ref 15–41)
Albumin: 3.7 g/dL (ref 3.5–5.0)
BILIRUBIN TOTAL: 0.1 mg/dL — AB (ref 0.3–1.2)
BUN: 16 mg/dL (ref 6–20)
CALCIUM: 8.8 mg/dL — AB (ref 8.9–10.3)
CO2: 25 mmol/L (ref 22–32)
Chloride: 106 mmol/L (ref 101–111)
Creatinine, Ser: 0.66 mg/dL (ref 0.44–1.00)
Glucose, Bld: 111 mg/dL — ABNORMAL HIGH (ref 65–99)
POTASSIUM: 3.5 mmol/L (ref 3.5–5.1)
Sodium: 141 mmol/L (ref 135–145)
TOTAL PROTEIN: 6.9 g/dL (ref 6.5–8.1)

## 2016-04-22 LAB — URINE CULTURE: Culture: NO GROWTH

## 2016-04-22 MED ORDER — PREDNISONE 20 MG PO TABS
40.0000 mg | ORAL_TABLET | Freq: Every day | ORAL | Status: DC
  Administered 2016-04-22 – 2016-04-24 (×3): 40 mg via ORAL
  Filled 2016-04-22 (×4): qty 2

## 2016-04-22 MED ORDER — OXYCODONE HCL 5 MG PO TABS: 5.0000 mg | ORAL_TABLET | ORAL | Status: DC | PRN

## 2016-04-22 MED ORDER — HYDROMORPHONE HCL 1 MG/ML IJ SOLN
1.0000 mg | INTRAMUSCULAR | Status: DC | PRN
  Administered 2016-04-23 (×2): 1 mg via INTRAVENOUS
  Filled 2016-04-22 (×2): qty 1

## 2016-04-22 MED ORDER — ZOLPIDEM TARTRATE 5 MG PO TABS
5.0000 mg | ORAL_TABLET | Freq: Once | ORAL | Status: AC
  Administered 2016-04-22: 5 mg via ORAL
  Filled 2016-04-22: qty 1

## 2016-04-22 MED ORDER — ONDANSETRON HCL 4 MG/2ML IJ SOLN
4.0000 mg | Freq: Once | INTRAMUSCULAR | Status: AC
Start: 2016-04-22 — End: 2016-04-22
  Administered 2016-04-22: 4 mg via INTRAVENOUS
  Filled 2016-04-22: qty 2

## 2016-04-22 MED ORDER — HYDROMORPHONE HCL 1 MG/ML IJ SOLN
1.0000 mg | Freq: Once | INTRAMUSCULAR | Status: AC
  Administered 2016-04-22: 1 mg via INTRAVENOUS
  Filled 2016-04-22: qty 1

## 2016-04-22 MED ORDER — AZATHIOPRINE 50 MG PO TABS
150.0000 mg | ORAL_TABLET | Freq: Every day | ORAL | Status: DC
  Administered 2016-04-22 – 2016-04-23 (×2): 150 mg via ORAL
  Filled 2016-04-22 (×3): qty 3

## 2016-04-22 MED ORDER — BUTALBITAL-APAP-CAFFEINE 50-325-40 MG PO TABS
1.0000 | ORAL_TABLET | Freq: Four times a day (QID) | ORAL | Status: DC | PRN
  Administered 2016-04-22 – 2016-04-24 (×3): 1 via ORAL
  Filled 2016-04-22 (×3): qty 1

## 2016-04-22 MED ORDER — ONDANSETRON HCL 4 MG PO TABS: 4.0000 mg | ORAL_TABLET | Freq: Four times a day (QID) | ORAL | Status: DC | PRN

## 2016-04-22 MED ORDER — SODIUM CHLORIDE 0.9 % IV SOLN: INTRAVENOUS | Status: DC

## 2016-04-22 MED ORDER — SODIUM CHLORIDE 0.9 % IV SOLN
INTRAVENOUS | Status: DC
  Administered 2016-04-22: 13:00:00 via INTRAVENOUS

## 2016-04-22 MED ORDER — ONDANSETRON HCL 4 MG/2ML IJ SOLN
4.0000 mg | Freq: Four times a day (QID) | INTRAMUSCULAR | Status: DC | PRN
  Administered 2016-04-22 – 2016-04-23 (×4): 4 mg via INTRAVENOUS
  Filled 2016-04-22 (×4): qty 2

## 2016-04-22 MED ORDER — ACETAMINOPHEN 650 MG RE SUPP: 650.0000 mg | Freq: Four times a day (QID) | RECTAL | Status: DC | PRN

## 2016-04-22 MED ORDER — ACETAMINOPHEN 325 MG PO TABS
650.0000 mg | ORAL_TABLET | Freq: Four times a day (QID) | ORAL | Status: DC | PRN
  Filled 2016-04-22: qty 2

## 2016-04-22 MED ORDER — SERTRALINE HCL 100 MG PO TABS
100.0000 mg | ORAL_TABLET | Freq: Every day | ORAL | Status: DC
  Administered 2016-04-22 – 2016-04-24 (×3): 100 mg via ORAL
  Filled 2016-04-22 (×3): qty 1

## 2016-04-22 NOTE — ED Notes (Signed)
Bed: TV98 Expected date:  Expected time:  Means of arrival:  Comments:

## 2016-04-22 NOTE — ED Notes (Signed)
Pt had a lumbar puncture at [facility] Wednesday morning, pt c/o headache since Thursday,has been worsening, pt has been lying flat x several days.

## 2016-04-22 NOTE — H&P (Signed)
History and Physical    [name] [name]:[id number] DOB: Feb 04, 1976 DOA: 04/22/2016  Referring MD/NP/PA:  PCP: Eliezer Lofts, MD  Outpatient Specialists: GI Dr Havery Moros Patient coming from: Home  Chief Complaint: Headache  HPI: [name] is a 40 y.o. female with medical history significant of Crohn's disease, major depression, gastric esophageal reflux disease, endometriosis, who was recently admitted from 04/17/2016 through 04/20/2016 initially presenting with neck stiffness, fever, multiple joint pains. During that hospitalization she was diagnosed with serum sickness after recently being started on infliximab. During that hospitalization she underwent lumbar puncture on 04/17/2016 with CSF fluid analysis unremarkable. Postprocedure she developed severe headache that was positional. Interventional radiology was consulted for placement of blood patch. She was seen and evaluated by Dr. Barbie Banner recommended reevaluation the following week to determine if she needed blood patch. Since discharge she reports severe positional headache, which has essentially kept her bedbound at home. She describes excruciating headaches when having to sit up to use the bathroom. She also complains of lower back pain. She denies fevers chills nausea vomiting dysuria hematuria.  ED Course: She was given a milligram of IV Dilaudid and Zofran in the emergency room after which she experienced some relief.  Review of Systems: As per HPI otherwise 10 point review of systems negative.    Past Medical History  Diagnosis Date  . Crohn disease ([place]) 2000  . Asthma   . Endometriosis   . Dysautonomia     recurrent syncope s/p ILR by Dr Doreatha Lew  . Palpitations   . Anxiety   . GERD (gastroesophageal reflux disease)   . Vertigo   . H/O hiatal hernia   . Chest pain     a. s/p intermediate risk nuclear stress test on 09/23/14 and normal LHC on 09/24/14   . Chronic nausea   . Migraine   . Pulmonary embolism affecting  pregnancy   . Anginal pain ([place]) only on 09/22/2014    Past Surgical History  Procedure Laterality Date  . Ileocecetomy  2002 X 2  . Loop recorder implant  11/2009    by DR Doreatha Lew  . Knee arthroscopy Bilateral 1988-1990  . Cesarean section  2011    emergency  CS due to placental abruption  . Partial colectomy  2002    partial removed due to crohns  . Appendectomy  ~ 1992  . Tonsillectomy  ~ 1996  . US echocardiography  11/11/2009    EF 55-60%  . Laparoscopy for ectopic pregnancy  11/2012  . Nasal sinus surgery  ~ 2007  . Endoscopic release transverse carpal ligament of hand Right 12/2010  . Colon surgery    . Cystoscopy w/ stone manipulation  X 2  . Cesarean section  2014    "preeclampsia"  . Left heart catheterization with coronary angiogram N/A 09/24/2014    Procedure: LEFT HEART CATHETERIZATION WITH CORONARY ANGIOGRAM;  Surgeon: Leonie Man, MD;  Location: [facility] CATH LAB;  Service: Cardiovascular;  Laterality: N/A;     reports that she has never smoked. She has never used smokeless tobacco. She reports that she drinks alcohol. She reports that she does not use illicit drugs.  Allergies  Allergen Reactions  . Compazine [Prochlorperazine Edisylate] Anaphylaxis  . Reglan [Metoclopramide] Anaphylaxis and Other (See Comments)    Other reaction(s): GI Upset (intolerance) Intensifies her Chron's  . Sulfamethoxazole Rash and Other (See Comments)    Internal bleeding and kidney infection  . Biaxin [Clarithromycin] Other (See Comments)    Cant  take due to Chrons disease  . Chlorhexidine Other (See Comments)    Blisters after using a couple of times.   . Remicade [Infliximab] Other (See Comments)    Serum Sickness  Couldn't walk, open mouth, pain,   . Codeine Nausea And Vomiting and Other (See Comments)    Dilaudid and demerol OK  . Hydrocodone Nausea And Vomiting  . Macrobid [Nitrofurantoin] Nausea And Vomiting    Other reaction(s): GI Upset (intolerance)  . Other Rash     "chloraprep"  . Promethazine Rash and Other (See Comments)    Muscular seizures  . Sulfa Antibiotics Hives and Other (See Comments)    Hematemesis; documented while a young child    Family History  Problem Relation Age of Onset  . Hypertension Mother   . Hyperlipidemia Mother   . Arthritis Mother   . Diabetes Father   . Coronary artery disease Father   . Heart disease Father 88    MI age 33  . Hyperlipidemia Brother   . Hypertension Brother   . Colon cancer Paternal Grandmother     Prior to Admission medications   Medication Sig Start Date End Date Taking? Authorizing Provider  acetaminophen (TYLENOL) 500 MG tablet Take 1,000 mg by mouth every 6 (six) hours as needed for headache.   Yes Historical Provider, MD  albuterol (PROVENTIL HFA;VENTOLIN HFA) 108 (90 BASE) MCG/ACT inhaler Inhale 2 puffs into the lungs every 6 (six) hours as needed for wheezing or shortness of breath. 07/25/15  Yes Jearld Fenton, NP  ALPRAZolam (XANAX) 0.25 MG tablet TAKE 2 TABLETS BY MOUTH EVERY DAY AS NEEDED Patient taking differently: TAKE 2 TABLETS BY MOUTH EVERY DAY AS NEEDED FOR ANXIETY. 03/06/16  Yes Amy Cletis Athens, MD  azaTHIOprine (IMURAN) 50 MG tablet Take 150 mg daily 01/23/16  Yes Manus Gunning, MD  butalbital-acetaminophen-caffeine (FIORICET, ESGIC) 50-325-40 MG tablet TAKE 2 TABLETS EVERY 6 HOURS AS NEEDED Patient taking differently: Take 1-2 tablets by mouth every 6 (six) hours as needed for headache or migraine.  04/16/16  Yes Zenovia Jarred, DO  Cetirizine HCl (ZYRTEC ALLERGY PO) Take 1 tablet by mouth daily.    Yes Historical Provider, MD  cyanocobalamin (,VITAMIN B-12,) 1000 MCG/ML injection Inject 1,000 mcg into the muscle every 21 ( twenty-one) days.   Yes Historical Provider, MD  cyclobenzaprine (FLEXERIL) 10 MG tablet Take 1 tablet (10 mg total) by mouth at bedtime as needed for muscle spasms. 04/17/16  Yes Amy Cletis Athens, MD  ondansetron (ZOFRAN) 4 MG tablet Take 1 tablet (4 mg total)  by mouth every 8 (eight) hours as needed for nausea or vomiting. 04/16/16  Yes Zenovia Jarred, DO  ranitidine (ZANTAC) 150 MG tablet Take 1 tablet (150 mg total) by mouth every morning. 01/17/16  Yes Manus Gunning, MD  sertraline (ZOLOFT) 100 MG tablet Take 1 tablet (100 mg total) by mouth daily. 12/28/15  Yes Amy E Bedsole, MD  zolpidem (AMBIEN) 10 MG tablet TAKE ONE TABLET BY MOUTH EVERY NIGHT AT BEDTIME 03/26/16  Yes Amy E Bedsole, MD  dexlansoprazole (DEXILANT) 60 MG capsule Take 1 capsule (60 mg total) by mouth daily. 09/08/15   Amy Cletis Athens, MD  hydrocortisone (ANUSOL-HC) 25 MG suppository Place 1 suppository (25 mg total) rectally 2 (two) times daily. Patient not taking: Reported on 04/22/2016 04/20/16 04/22/16  Bonnell Public, MD  Prenatal Vit-Fe Fumarate-FA (PRENATAL PO) Take 2 tablets by mouth every evening. Prenatal gummy vitamin.    Historical  Provider, MD    Physical Exam: Filed Vitals:   04/22/16 1300 04/22/16 1302 04/22/16 1330 04/22/16 1449  BP: 119/87 119/87 124/78 128/71  Pulse: 69 68 61 62  Temp:      TempSrc:      Resp:  18  18  SpO2: 100% 100% 97% 100%      Constitutional: NAD, laying flatter back Filed Vitals:   04/22/16 1300 04/22/16 1302 04/22/16 1330 04/22/16 1449  BP: 119/87 119/87 124/78 128/71  Pulse: 69 68 61 62  Temp:      TempSrc:      Resp:  18  18  SpO2: 100% 100% 97% 100%   Eyes: PERRL, lids and conjunctivae normal ENMT: Mucous membranes are moist. Posterior pharynx clear of any exudate or lesions.Normal dentition.  Neck: normal, supple, no masses, no thyromegaly Respiratory: clear to auscultation bilaterally, no wheezing, no crackles. Normal respiratory effort. No accessory muscle use.  Cardiovascular: Regular rate and rhythm, no murmurs / rubs / gallops. No extremity edema. 2+ pedal pulses. No carotid bruits.  Abdomen: no tenderness, no masses palpated. No hepatosplenomegaly. Bowel sounds positive.  Musculoskeletal: no clubbing /  cyanosis. No joint deformity upper and lower extremities. Good ROM, no contractures. Normal muscle tone.  Skin: no rashes, lesions, ulcers. No induration Neurologic: CN 2-12 grossly intact. Sensation intact, DTR normal. Strength 5/5 in all 4. She reports experiencing excruciating headache with sitting up Psychiatric: Normal judgment and insight. Alert and oriented x 3. Normal mood.    Labs on Admission: I have personally reviewed following labs and imaging studies  CBC:  Recent Labs Lab 04/17/16 2345 04/18/16 0450 04/19/16 0841 04/20/16 0304 04/22/16 1212  WBC 17.7* 14.9* 10.7* 8.7 8.8  NEUTROABS 14.4*  --  9.6*  --  5.4  HGB 12.1 10.5* 11.4* 10.0* 13.0  HCT 37.9 32.5* 35.2* 31.4* 38.5  MCV 88.3 87.8 85.9 87.2 83.5  PLT 347 283 355 348 138*   Basic Metabolic Panel:  Recent Labs Lab 04/17/16 2345 04/18/16 0450 04/19/16 0941 04/22/16 1212  NA 141 139 141 141  K 3.7 3.3* 4.0 3.5  CL 110 112* 110 106  CO2 20* 18* 19* 25  GLUCOSE 117* 90 208* 111*  BUN 12 8 8 16   CREATININE 0.83 0.60 0.55 0.66  CALCIUM 8.7* 7.4* 9.0 8.8*  PHOS  --   --  2.1*  --    GFR: Estimated Creatinine Clearance: 80.7 mL/min (by C-G formula based on Cr of 0.66). Liver Function Tests:  Recent Labs Lab 04/17/16 2345 04/19/16 0941 04/22/16 1212  AST 19  --  30  ALT 14  --  34  ALKPHOS 67  --  75  BILITOT 0.3  --  0.1*  PROT 6.7  --  6.9  ALBUMIN 3.6 3.3* 3.7   No results for input(s): LIPASE, AMYLASE in the last 168 hours. No results for input(s): AMMONIA in the last 168 hours. Coagulation Profile:  Recent Labs Lab 04/17/16 2345  INR 1.10   Cardiac Enzymes:  Recent Labs Lab 04/17/16 2345  TROPONINI <0.03   BNP (last 3 results) No results for input(s): PROBNP in the last 8760 hours. HbA1C: No results for input(s): HGBA1C in the last 72 hours. CBG:  Recent Labs Lab 04/18/16 1002 04/19/16 0933 04/20/16 0611  GLUCAP 114* 203* 106*   Lipid Profile: No results for  input(s): CHOL, HDL, LDLCALC, TRIG, CHOLHDL, LDLDIRECT in the last 72 hours. Thyroid Function Tests: No results for input(s): TSH, T4TOTAL, FREET4, T3FREE, THYROIDAB  in the last 72 hours. Anemia Panel: No results for input(s): VITAMINB12, FOLATE, FERRITIN, TIBC, IRON, RETICCTPCT in the last 72 hours. Urine analysis:    Component Value Date/Time   COLORURINE YELLOW 04/22/2016 1241   APPEARANCEUR CLEAR 04/22/2016 1241   LABSPEC 1.028 04/22/2016 1241   PHURINE 6.5 04/22/2016 1241   GLUCOSEU NEGATIVE 04/22/2016 1241   HGBUR NEGATIVE 04/22/2016 1241   BILIRUBINUR NEGATIVE 04/22/2016 1241   BILIRUBINUR negative 10/14/2015 1652   BILIRUBINUR negative 08/20/2014 1626   KETONESUR NEGATIVE 04/22/2016 1241   KETONESUR negative 10/14/2015 1652   PROTEINUR NEGATIVE 04/22/2016 1241   PROTEINUR negative 10/14/2015 1652   PROTEINUR trace 08/20/2014 1626   UROBILINOGEN 0.2 10/14/2015 1652   UROBILINOGEN 0.2 03/20/2015 1805   NITRITE NEGATIVE 04/22/2016 1241   NITRITE Negative 10/14/2015 1652   NITRITE negative 08/20/2014 1626   LEUKOCYTESUR NEGATIVE 04/22/2016 1241   Sepsis Labs: @LABRCNTIP (procalcitonin:4,lacticidven:4) ) Recent Results (from the past 240 hour(s))  Blood Culture (routine x 2)     Status: None (Preliminary result)   Collection Time: 04/17/16 11:45 PM  Result Value Ref Range Status   Specimen Description BLOOD LEFT ARM  Final   Special Requests BOTTLES DRAWN AEROBIC AND ANAEROBIC 5ML  Final   Culture NO GROWTH 4 DAYS  Final   Report Status PENDING  Incomplete  Blood Culture (routine x 2)     Status: None (Preliminary result)   Collection Time: 04/17/16 11:55 PM  Result Value Ref Range Status   Specimen Description BLOOD RIGHT ARM  Final   Special Requests BOTTLES DRAWN AEROBIC AND ANAEROBIC 5ML  Final   Culture NO GROWTH 4 DAYS  Final   Report Status PENDING  Incomplete  Urine culture     Status: Abnormal   Collection Time: 04/18/16 12:00 AM  Result Value Ref Range  Status   Specimen Description URINE, CLEAN CATCH  Final   Special Requests NONE  Final   Culture MULTIPLE SPECIES PRESENT, SUGGEST RECOLLECTION (A)  Final   Report Status 04/19/2016 FINAL  Final  CSF culture     Status: None   Collection Time: 04/18/16  3:20 AM  Result Value Ref Range Status   Specimen Description CSF  Final   Special Requests NONE  Final   Gram Stain   Final    CYTOSPIN SMEAR WBC PRESENT, PREDOMINANTLY MONONUCLEAR NO ORGANISMS SEEN    Culture NO GROWTH 3 DAYS  Final   Report Status 04/21/2016 FINAL  Final  Culture, Urine     Status: None   Collection Time: 04/20/16  4:47 PM  Result Value Ref Range Status   Specimen Description URINE, CLEAN CATCH  Final   Special Requests NONE  Final   Culture NO GROWTH 2 DAYS  Final   Report Status 04/22/2016 FINAL  Final     Radiological Exams on Admission: No results found.  EKG: Independently reviewed.   Assessment/Plan Principal Problem:   CSF leak Active Problems:   Crohn's disease (HCC)   Headache   1.  Probable CSF leak. Mrs Santaella was admitted last week when she presented with complaints of neck stiffness and multiple joint pain, as part of workup she underwent a lumbar puncture that was performed on 04/17/2016. After procedure she reported severe positional headaches. She was evaluated by interventional radiology during that hospitalization. Recommended reevaluation next week to determine if she needed blood patch. Since discharge she states positional headaches have been disabling. Case was discussed with Dr Lenox Ahr of interventional radiology will likely undergo  blood patch placement tomorrow morning.   2.  Serum sickness. She has a history of Crohn's disease and was recently given a remicade infusion. Her symptoms on a recent hospitalization were attributed to serum sickness secondary to Remicade. She was discharged on a prednisone taper. Will continue prednisone 40 mg by mouth daily.  3.  History of Crohn's  disease. Stable. Underwent recent Remicade infusion which likely precipitated serum sickness. She remains on Imuran 150 mg by mouth daily    DVT prophylaxis: SCD's Code Status: Full code Family Communication: Spoke to her husband was present at bedside Disposition Plan: Anticipate she will require at least 2 nights hospitalization Consults called: Interventional radiology, Dr Lenox Ahr Admission status: Admit to the patient's   Kelvin Cellar MD [facility] Pager [phone]  If 7PM-7AM, please contact night-coverage [email or web address] Password [email or web address]  04/22/2016, 3:05 PM

## 2016-04-22 NOTE — ED Notes (Signed)
Report given to [facility]. Pt ready for transport.

## 2016-04-22 NOTE — ED Provider Notes (Signed)
CSN: [id number]     Arrival date & time 04/22/16  1121 History   First MD Initiated Contact with Patient 04/22/16 1150     Chief Complaint  Patient presents with  . Headache     (Consider location/radiation/quality/duration/timing/severity/associated sxs/prior Treatment) HPI Patient continues to have a headache since her discharge from hospital. She reports if she stays flat and still she does not have a headache. She reports however if she tries to sit up and get an upright position she has a severe, crushing headache. She has not developed any fever. She has not had vomiting. She reports that she was discharged with instructions to try to lie flat for a spinal headache. She reports she has been compliant with that but she does have Crohn's disease so if she has to go the bathroom or have a bowel movement she is forced to be an upright position. She reports that she had lower back pain prior to her admission and that has been ongoing. She does not identify that as being a different or new complaints. Past Medical History  Diagnosis Date  . Crohn disease ([place]) 2000  . Asthma   . Endometriosis   . Dysautonomia     recurrent syncope s/p ILR by Dr Doreatha Lew  . Palpitations   . Anxiety   . GERD (gastroesophageal reflux disease)   . Vertigo   . H/O hiatal hernia   . Chest pain     a. s/p intermediate risk nuclear stress test on 09/23/14 and normal LHC on 09/24/14   . Chronic nausea   . Migraine   . Pulmonary embolism affecting pregnancy   . Anginal pain ([place]) only on 09/22/2014   Past Surgical History  Procedure Laterality Date  . Ileocecetomy  2002 X 2  . Loop recorder implant  11/2009    by DR Doreatha Lew  . Knee arthroscopy Bilateral 1988-1990  . Cesarean section  2011    emergency  CS due to placental abruption  . Partial colectomy  2002    partial removed due to crohns  . Appendectomy  ~ 1992  . Tonsillectomy  ~ 1996  . US echocardiography  11/11/2009    EF 55-60%  . Laparoscopy  for ectopic pregnancy  11/2012  . Nasal sinus surgery  ~ 2007  . Endoscopic release transverse carpal ligament of hand Right 12/2010  . Colon surgery    . Cystoscopy w/ stone manipulation  X 2  . Cesarean section  2014    "preeclampsia"  . Left heart catheterization with coronary angiogram N/A 09/24/2014    Procedure: LEFT HEART CATHETERIZATION WITH CORONARY ANGIOGRAM;  Surgeon: Leonie Man, MD;  Location: [facility] CATH LAB;  Service: Cardiovascular;  Laterality: N/A;   Family History  Problem Relation Age of Onset  . Hypertension Mother   . Hyperlipidemia Mother   . Arthritis Mother   . Diabetes Father   . Coronary artery disease Father   . Heart disease Father 74    MI age 63  . Hyperlipidemia Brother   . Hypertension Brother   . Colon cancer Paternal Grandmother    Social History  Substance Use Topics  . Smoking status: Never Smoker   . Smokeless tobacco: Never Used  . Alcohol Use: Yes   OB History    Gravida Para Term Preterm AB TAB SAB Ectopic Multiple Living   4 2 0 2 1 0 0 1 0 2      Review of Systems 10 Systems reviewed and are  negative for acute change except as noted in the HPI.    Allergies  Compazine; Reglan; Sulfamethoxazole; Biaxin; Chlorhexidine; Remicade; Codeine; Hydrocodone; Macrobid; Other; Promethazine; and Sulfa antibiotics  Home Medications   Prior to Admission medications   Medication Sig Start Date End Date Taking? Authorizing Provider  acetaminophen (TYLENOL) 500 MG tablet Take 1,000 mg by mouth every 6 (six) hours as needed for headache.   Yes Historical Provider, MD  albuterol (PROVENTIL HFA;VENTOLIN HFA) 108 (90 BASE) MCG/ACT inhaler Inhale 2 puffs into the lungs every 6 (six) hours as needed for wheezing or shortness of breath. 07/25/15  Yes Jearld Fenton, NP  ALPRAZolam (XANAX) 0.25 MG tablet TAKE 2 TABLETS BY MOUTH EVERY DAY AS NEEDED Patient taking differently: TAKE 2 TABLETS BY MOUTH EVERY DAY AS NEEDED FOR ANXIETY. 03/06/16  Yes Amy Cletis Athens, MD  azaTHIOprine (IMURAN) 50 MG tablet Take 150 mg daily 01/23/16  Yes Manus Gunning, MD  butalbital-acetaminophen-caffeine (FIORICET, ESGIC) 50-325-40 MG tablet TAKE 2 TABLETS EVERY 6 HOURS AS NEEDED Patient taking differently: Take 1-2 tablets by mouth every 6 (six) hours as needed for headache or migraine.  04/16/16  Yes Zenovia Jarred, DO  Cetirizine HCl (ZYRTEC ALLERGY PO) Take 1 tablet by mouth daily.    Yes Historical Provider, MD  cyanocobalamin (,VITAMIN B-12,) 1000 MCG/ML injection Inject 1,000 mcg into the muscle every 21 ( twenty-one) days.   Yes Historical Provider, MD  cyclobenzaprine (FLEXERIL) 10 MG tablet Take 1 tablet (10 mg total) by mouth at bedtime as needed for muscle spasms. 04/17/16  Yes Amy Cletis Athens, MD  ondansetron (ZOFRAN) 4 MG tablet Take 1 tablet (4 mg total) by mouth every 8 (eight) hours as needed for nausea or vomiting. 04/16/16  Yes Zenovia Jarred, DO  ranitidine (ZANTAC) 150 MG tablet Take 1 tablet (150 mg total) by mouth every morning. 01/17/16  Yes Manus Gunning, MD  sertraline (ZOLOFT) 100 MG tablet Take 1 tablet (100 mg total) by mouth daily. 12/28/15  Yes Amy E Bedsole, MD  zolpidem (AMBIEN) 10 MG tablet TAKE ONE TABLET BY MOUTH EVERY NIGHT AT BEDTIME 03/26/16  Yes Amy E Bedsole, MD  dexlansoprazole (DEXILANT) 60 MG capsule Take 1 capsule (60 mg total) by mouth daily. 09/08/15   Amy Cletis Athens, MD  hydrocortisone (ANUSOL-HC) 25 MG suppository Place 1 suppository (25 mg total) rectally 2 (two) times daily. Patient not taking: Reported on 04/22/2016 04/20/16 04/22/16  Bonnell Public, MD  Prenatal Vit-Fe Fumarate-FA (PRENATAL PO) Take 2 tablets by mouth every evening. Prenatal gummy vitamin.    Historical Provider, MD   BP 128/71 mmHg  Pulse 62  Temp(Src) 98.6 F (37 C) (Oral)  Resp 18  SpO2 100%  LMP 04/11/2016  Breastfeeding? Yes Physical Exam  Constitutional: She is oriented to person, place, and time. She appears well-developed and  well-nourished.  HENT:  Head: Normocephalic and atraumatic.  Right Ear: External ear normal.  Left Ear: External ear normal.  Nose: Nose normal.  Mouth/Throat: Oropharynx is clear and moist.  Eyes: EOM are normal. Pupils are equal, round, and reactive to light.  Neck: Neck supple.  No meningismus. No cervical lymphadenopathy.  Cardiovascular: Normal rate, regular rhythm, normal heart sounds and intact distal pulses.   Pulmonary/Chest: Effort normal and breath sounds normal.  Abdominal: Soft. Bowel sounds are normal. She exhibits no distension. There is no tenderness.  Musculoskeletal: Normal range of motion. She exhibits no edema or tenderness.  Neurological: She is alert and  oriented to person, place, and time. She has normal strength. No cranial nerve deficit. She exhibits normal muscle tone. Coordination normal. GCS eye subscore is 4. GCS verbal subscore is 5. GCS motor subscore is 6.  Skin: Skin is warm, dry and intact.  Psychiatric: She has a normal mood and affect.    ED Course  Procedures (including critical care time) Labs Review Labs Reviewed  COMPREHENSIVE METABOLIC PANEL - Abnormal; Notable for the following:    Glucose, Bld 111 (*)    Calcium 8.8 (*)    Total Bilirubin 0.1 (*)    All other components within normal limits  CBC WITH DIFFERENTIAL/PLATELET - Abnormal; Notable for the following:    RDW 16.0 (*)    Platelets 506 (*)    All other components within normal limits  URINALYSIS, ROUTINE W REFLEX MICROSCOPIC (NOT AT [facility])    Imaging Review No results found. I have personally reviewed and evaluated these images and lab results as part of my medical decision-making.   EKG Interpretation None     Consult: 13:05 discussed with Dr. Barbie Banner. He was familiar with the case as he had been consult it during her hospitalization. He reports at that time he did not feel it was safe to perform a blood patch due to possible UTI. We did review her most recent diagnostics with  a negative culture of urine, blood and CSF. He advises the patient could be seen for blood patch placement on Monday. He advises can be done as outpatient with an order placed today.  Consult: [facility] for admission. Patient continues to have intractable pain. She will be admitted for intractable pain awaiting blood patch placement. MDM   Final diagnoses:  Spinal headache   Patient presents post hospitalization with spinal headache. Her headache is excruciating in an upright position and minimal while supine. She does not have other meningismus, vomiting, fever or malaise. This does appear consistent with a post-spinal headache. This has been reviewed with Dr. Barbie Banner as outlined. At this time, the patient has intractable pain if upright and cannot function at home. She will be admitted for pain control.   Charlesetta Shanks, MD 04/22/16 [phone]

## 2016-04-23 ENCOUNTER — Telehealth: Payer: Self-pay

## 2016-04-23 ENCOUNTER — Observation Stay (HOSPITAL_COMMUNITY): Payer: BC Managed Care – PPO

## 2016-04-23 ENCOUNTER — Telehealth: Payer: Self-pay | Admitting: Neurology

## 2016-04-23 DIAGNOSIS — D72829 Elevated white blood cell count, unspecified: Secondary | ICD-10-CM | POA: Diagnosis not present

## 2016-04-23 DIAGNOSIS — G960 Cerebrospinal fluid leak: Secondary | ICD-10-CM | POA: Diagnosis not present

## 2016-04-23 DIAGNOSIS — F329 Major depressive disorder, single episode, unspecified: Secondary | ICD-10-CM

## 2016-04-23 DIAGNOSIS — K5000 Crohn's disease of small intestine without complications: Secondary | ICD-10-CM | POA: Diagnosis not present

## 2016-04-23 DIAGNOSIS — R51 Headache: Secondary | ICD-10-CM | POA: Diagnosis not present

## 2016-04-23 LAB — CULTURE, BLOOD (ROUTINE X 2)
CULTURE: NO GROWTH
CULTURE: NO GROWTH

## 2016-04-23 LAB — CBC
HCT: 38.2 % (ref 36.0–46.0)
Hemoglobin: 13.1 g/dL (ref 12.0–15.0)
MCH: 28.6 pg (ref 26.0–34.0)
MCHC: 34.3 g/dL (ref 30.0–36.0)
MCV: 83.4 fL (ref 78.0–100.0)
PLATELETS: 526 10*3/uL — AB (ref 150–400)
RBC: 4.58 MIL/uL (ref 3.87–5.11)
RDW: 15.9 % — ABNORMAL HIGH (ref 11.5–15.5)
WBC: 11.2 10*3/uL — ABNORMAL HIGH (ref 4.0–10.5)

## 2016-04-23 LAB — BASIC METABOLIC PANEL
Anion gap: 9 (ref 5–15)
BUN: 14 mg/dL (ref 6–20)
CALCIUM: 8.6 mg/dL — AB (ref 8.9–10.3)
CO2: 23 mmol/L (ref 22–32)
CREATININE: 0.46 mg/dL (ref 0.44–1.00)
Chloride: 108 mmol/L (ref 101–111)
GFR calc non Af Amer: >60 mL/min (ref >60)
Glucose, Bld: 138 mg/dL — ABNORMAL HIGH (ref 65–99)
Potassium: 4.3 mmol/L (ref 3.5–5.1)
Sodium: 140 mmol/L (ref 135–145)

## 2016-04-23 MED ORDER — IOHEXOL 180 MG/ML  SOLN
20.0000 mL | Freq: Once | INTRAMUSCULAR | Status: AC | PRN
  Administered 2016-04-23: 3 mL via INTRAVENOUS

## 2016-04-23 MED ORDER — ZOLPIDEM TARTRATE 5 MG PO TABS
5.0000 mg | ORAL_TABLET | Freq: Every evening | ORAL | Status: DC | PRN
  Administered 2016-04-23: 5 mg via ORAL
  Filled 2016-04-23: qty 1

## 2016-04-23 MED ORDER — SODIUM CHLORIDE 0.9 % IJ SOLN
INTRAMUSCULAR | Status: AC
  Filled 2016-04-23: qty 10

## 2016-04-23 MED ORDER — LIDOCAINE HCL (PF) 1 % IJ SOLN
INTRAMUSCULAR | Status: AC
  Filled 2016-04-23: qty 30

## 2016-04-23 NOTE — Telephone Encounter (Addendum)
Please check on her again today. We did not order LP, ask her who ordered it for what reason, she should contact ordering physician for post LP headaches.

## 2016-04-23 NOTE — Telephone Encounter (Signed)
Patient called the after hours call service on 04/21/2016. I talked to her on the phone. She was recently hospitalized for a reaction to Remicade which she is taking for her Crohn's disease. She says in the process of her workup and treatment she had a lumbar puncture and developed severe post LP headache. She was told that she was not a candidate for a blood patch because of her immune suppressive medication. I advised her to continue to try to rest, stay well-hydrated and utilize caffeine-containing beverages for her headache. I promised her that I would let Dr. Krista Blue know. Dr. Krista Blue sees her for migraine headaches and she has any additional advice, she or her nurse will call her back.

## 2016-04-23 NOTE — Telephone Encounter (Addendum)
Spoke to patient - she returned to [facility] and received a blood patch today. She is doing better and is expected to be discharged home tomorrow.

## 2016-04-23 NOTE — Progress Notes (Signed)
Patient ID: [name], female   DOB: September 22, 1976, 40 y.o.   MRN: [id number] Patient continues to have  positional headache despite conservative measures. Case reviewed again with Dr. Barbie Banner. Plans are to have patient undergo blood patch this afternoon with Dr. Barbie Banner at [facility]. Details/risks of procedure discussed with patient with her understanding and consent.

## 2016-04-23 NOTE — Telephone Encounter (Signed)
Per chart review tab pt admitted to [facility] on 04/22/2016.

## 2016-04-23 NOTE — Progress Notes (Signed)
PROGRESS NOTE    [name]  DJS:[id number] DOB: [birth year] DOA: 04/22/2016 PCP: Eliezer Lofts, MD  Outpatient Specialists:  Brief Narrative:  On 04/22/2016 by Dr. Kelvin Cellar [name] is a 40 y.o. female with medical history significant of Crohn's disease, major depression, gastric esophageal reflux disease, endometriosis, who was recently admitted from 04/17/2016 through 04/20/2016 initially presenting with neck stiffness, fever, multiple joint pains. During that hospitalization she was diagnosed with serum sickness after recently being started on infliximab. During that hospitalization she underwent lumbar puncture on 04/17/2016 with CSF fluid analysis unremarkable. Postprocedure she developed severe headache that was positional. Interventional radiology was consulted for placement of blood patch. She was seen and evaluated by Dr. Barbie Banner recommended reevaluation the following week to determine if she needed blood patch. Since discharge she reports severe positional headache, which has essentially kept her bedbound at home. She describes excruciating headaches when having to sit up to use the bathroom. She also complains of lower back pain. She denies fevers chills nausea vomiting dysuria hematuria.  Assessment & Plan   Headache/Probable CSF leak -Recently underwent LP on 04/17/2016 due to Complaints of neck stiffness and multiple joint pain after supposedly Remicade infusion. After the procedure, patient did report positional headaches. -She was evaluated by interventional radiology during the hospitalization, they recommended reevaluation next week to determine whether patient had blood patch. -Admitting physician spoke with interventional radiology, on 04/22/2016 -IR consulted, pending possible blood patch  Serum sickness -History of Crohn's disease with recent Remicade infusion. Patient had received Remicade approximately 12 years ago and then recently restarted on it. -She developed  serum sickness secondary to this, she was recently discharged with a prednisone taper. -Continue prednisone 40 mg daily  History of Crohn's disease -Currently stable -Continue Imuran 150 mg daily  Depression -Continue zoloft  Leukocytosis -Likely reactive/steroids -Continue to, monitor CBC  DVT Prophylaxis  SCDs  Code Status: Full  Family Communication: None at bedside  Disposition Plan: Admitted. Pending evaluation by interventional radiology  Consultants IR  Procedures  None  Antibiotics   Anti-infectives    None      Subjective:   [name] seen and examined today.  Continues to complain of headache, worsened by sitting up.  Denies chest pain, shortness of breath, abdominal pain.  Has nausea with headache occasionally.  Objective:   Filed Vitals:   04/22/16 1449 04/22/16 1703 04/22/16 2208 04/23/16 0449  BP: 128/71 126/74 122/76 102/71  Pulse: 62 65 63 79  Temp:  98.8 F (37.1 C) 98.1 F (36.7 C) 97.5 F (36.4 C)  TempSrc:  Oral Oral Oral  Resp: 18 18 16 18   Height:  5' 4"  (1.626 m)    Weight:  64.1 kg (141 lb 5 oz)    SpO2: 100% 99% 100% 100%   No intake or output data in the 24 hours ending 04/23/16 1107 Filed Weights   04/22/16 1703  Weight: 64.1 kg (141 lb 5 oz)    Exam  General: Well developed, well nourished, NAD, appears stated age  HEENT: NCAT, , mucous membranes moist.   Cardiovascular: S1 S2 auscultated, no rubs, murmurs or gallops. Regular rate and rhythm.  Respiratory: Clear to auscultation bilaterally with equal chest rise  Abdomen: Soft, nontender, nondistended, + bowel sounds  Extremities: warm dry without cyanosis clubbing or edema  Neuro: AAOx3, nonfocal  Skin: Without rashes exudates or nodules  Psych: Normal affect and demeanor with intact judgement and insight  Data Reviewed: I have personally reviewed  following labs and imaging studies  CBC:  Recent Labs Lab 04/17/16 2345 04/18/16 0450 04/19/16 0841  04/20/16 0304 04/22/16 1212 04/23/16 0737  WBC 17.7* 14.9* 10.7* 8.7 8.8 11.2*  NEUTROABS 14.4*  --  9.6*  --  5.4  --   HGB 12.1 10.5* 11.4* 10.0* 13.0 13.1  HCT 37.9 32.5* 35.2* 31.4* 38.5 38.2  MCV 88.3 87.8 85.9 87.2 83.5 83.4  PLT 347 283 355 348 506* 852*   Basic Metabolic Panel:  Recent Labs Lab 04/17/16 2345 04/18/16 0450 04/19/16 0941 04/22/16 1212 04/23/16 0737  NA 141 139 141 141 140  K 3.7 3.3* 4.0 3.5 4.3  CL 110 112* 110 106 108  CO2 20* 18* 19* 25 23  GLUCOSE 117* 90 208* 111* 138*  BUN 12 8 8 16 14   CREATININE 0.83 0.60 0.55 0.66 0.46  CALCIUM 8.7* 7.4* 9.0 8.8* 8.6*  PHOS  --   --  2.1*  --   --    GFR: Estimated Creatinine Clearance: 80.7 mL/min (by C-G formula based on Cr of 0.46). Liver Function Tests:  Recent Labs Lab 04/17/16 2345 04/19/16 0941 04/22/16 1212  AST 19  --  30  ALT 14  --  34  ALKPHOS 67  --  75  BILITOT 0.3  --  0.1*  PROT 6.7  --  6.9  ALBUMIN 3.6 3.3* 3.7   No results for input(s): LIPASE, AMYLASE in the last 168 hours. No results for input(s): AMMONIA in the last 168 hours. Coagulation Profile:  Recent Labs Lab 04/17/16 2345  INR 1.10   Cardiac Enzymes:  Recent Labs Lab 04/17/16 2345  TROPONINI <0.03   BNP (last 3 results) No results for input(s): PROBNP in the last 8760 hours. HbA1C: No results for input(s): HGBA1C in the last 72 hours. CBG:  Recent Labs Lab 04/18/16 1002 04/19/16 0933 04/20/16 0611  GLUCAP 114* 203* 106*   Lipid Profile: No results for input(s): CHOL, HDL, LDLCALC, TRIG, CHOLHDL, LDLDIRECT in the last 72 hours. Thyroid Function Tests: No results for input(s): TSH, T4TOTAL, FREET4, T3FREE, THYROIDAB in the last 72 hours. Anemia Panel: No results for input(s): VITAMINB12, FOLATE, FERRITIN, TIBC, IRON, RETICCTPCT in the last 72 hours. Urine analysis:    Component Value Date/Time   COLORURINE YELLOW 04/22/2016 1241   APPEARANCEUR CLEAR 04/22/2016 1241   LABSPEC 1.028 04/22/2016  1241   PHURINE 6.5 04/22/2016 1241   GLUCOSEU NEGATIVE 04/22/2016 1241   HGBUR NEGATIVE 04/22/2016 1241   BILIRUBINUR NEGATIVE 04/22/2016 1241   BILIRUBINUR negative 10/14/2015 1652   BILIRUBINUR negative 08/20/2014 1626   KETONESUR NEGATIVE 04/22/2016 1241   KETONESUR negative 10/14/2015 1652   PROTEINUR NEGATIVE 04/22/2016 1241   PROTEINUR negative 10/14/2015 1652   PROTEINUR trace 08/20/2014 1626   UROBILINOGEN 0.2 10/14/2015 1652   UROBILINOGEN 0.2 03/20/2015 1805   NITRITE NEGATIVE 04/22/2016 1241   NITRITE Negative 10/14/2015 1652   NITRITE negative 08/20/2014 1626   LEUKOCYTESUR NEGATIVE 04/22/2016 1241   Sepsis Labs: @LABRCNTIP (procalcitonin:4,lacticidven:4)  ) Recent Results (from the past 240 hour(s))  Blood Culture (routine x 2)     Status: None (Preliminary result)   Collection Time: 04/17/16 11:45 PM  Result Value Ref Range Status   Specimen Description BLOOD LEFT ARM  Final   Special Requests BOTTLES DRAWN AEROBIC AND ANAEROBIC 5ML  Final   Culture NO GROWTH 4 DAYS  Final   Report Status PENDING  Incomplete  Blood Culture (routine x 2)     Status: None (Preliminary  result)   Collection Time: 04/17/16 11:55 PM  Result Value Ref Range Status   Specimen Description BLOOD RIGHT ARM  Final   Special Requests BOTTLES DRAWN AEROBIC AND ANAEROBIC 5ML  Final   Culture NO GROWTH 4 DAYS  Final   Report Status PENDING  Incomplete  Urine culture     Status: Abnormal   Collection Time: 04/18/16 12:00 AM  Result Value Ref Range Status   Specimen Description URINE, CLEAN CATCH  Final   Special Requests NONE  Final   Culture MULTIPLE SPECIES PRESENT, SUGGEST RECOLLECTION (A)  Final   Report Status 04/19/2016 FINAL  Final  CSF culture     Status: None   Collection Time: 04/18/16  3:20 AM  Result Value Ref Range Status   Specimen Description CSF  Final   Special Requests NONE  Final   Gram Stain   Final    CYTOSPIN SMEAR WBC PRESENT, PREDOMINANTLY MONONUCLEAR NO  ORGANISMS SEEN    Culture NO GROWTH 3 DAYS  Final   Report Status 04/21/2016 FINAL  Final  Culture, Urine     Status: None   Collection Time: 04/20/16  4:47 PM  Result Value Ref Range Status   Specimen Description URINE, CLEAN CATCH  Final   Special Requests NONE  Final   Culture NO GROWTH 2 DAYS  Final   Report Status 04/22/2016 FINAL  Final      Radiology Studies: No results found.   Scheduled Meds: . azaTHIOprine  150 mg Oral Daily  . predniSONE  40 mg Oral Q breakfast  . sertraline  100 mg Oral Daily   Continuous Infusions: . sodium chloride 100 mL/hr at 04/22/16 1710     LOS: 1 day   Time Spent in minutes   30 minutes  Tavaras Goody D.O. on 04/23/2016 at 11:07 AM  Between 7am to 7pm - Pager - [phone]  After 7pm go to [email or web address] - password [email or web address]  And look for the night coverage person covering for me after hours  [facility] Office  [phone]

## 2016-04-23 NOTE — Procedures (Signed)
L L3/4 blood patch 15 cc blood No comp

## 2016-04-23 NOTE — Telephone Encounter (Signed)
PLEASE NOTE: All timestamps contained within this report are represented as [place] Standard Time. CONFIDENTIALTY NOTICE: This fax transmission is intended only for the addressee. It contains information that is legally privileged, confidential or otherwise protected from use or disclosure. If you are not the intended recipient, you are strictly prohibited from reviewing, disclosing, copying using or disseminating any of this information or taking any action in reliance on or regarding this information. If you have received this fax in error, please notify us immediately by telephone so that we can arrange for its return to [name]. Phone: [phone], Toll-Free: [phone], Fax: [phone] Page: 1 of 3 Call Id: [id number] [place] Patient Name: [name] Gender: Female DOB: Oct 12, 1976 Age: 40 Y 30 M 13 D Return Phone Number: [phone] (Primary), [phone] (Secondary) Address: City/State/Zip: [place] Night - Client Client Site [place] Physician Eliezer Lofts - MD Contact Type Call Who Is Calling Patient / Member / Family / Caregiver Call Type Triage / Clinical Relationship To Patient Self Return Phone Number [phone] (Primary) Chief Complaint Headache Reason for Call Symptomatic / Request for Health Information Initial Comment FAX: Has spinal headache from lumber puncture, wants on call to advise where she can go. *Remake from CID [id number]* PreDisposition Go to ED Translation No Nurse Assessment Nurse: Amalia Hailey, RN, Melissa Date/Time (Eastern Time): 04/21/2016 6:25:44 PM Confirm and document reason for call. If symptomatic, describe symptoms. You must click the next button to save text entered. ---FAX: Has spinal headache from lumber puncture, wants on call to advise where she can go. *Remake from CID [id number]* Has  the patient traveled out of the country within the last 30 days? ---Not Applicable Does the patient have any new or worsening symptoms? ---Yes Will a triage be completed? ---Yes Related visit to physician within the last 2 weeks? ---Yes Does the PT have any chronic conditions? (i.e. diabetes, asthma, etc.) ---Yes List chronic conditions. ---serum sickness-recent hospitalized Tues thru Friday d/c'd. Is the patient pregnant or possibly pregnant? (Ask all females between the ages of 31-55) ---No Is this a behavioral health or substance abuse call? ---No Guidelines Guideline Title Affirmed Question Affirmed Notes Nurse Date/Time (Eastern Time) Headache [1] SEVERE headache (e.g., excruciating) AND [2] "worst headache" of life Amalia Hailey, RN, Melissa 04/21/2016 6:28:01 PM Disp. Time Eilene Ghazi Time) Disposition Final User 04/21/2016 6:12:36 PM Send To RN Personal [place] Bellman, Geraldo Docker NOTE: All timestamps contained within this report are represented as [place] Standard Time. CONFIDENTIALTY NOTICE: This fax transmission is intended only for the addressee. It contains information that is legally privileged, confidential or otherwise protected from use or disclosure. If you are not the intended recipient, you are strictly prohibited from reviewing, disclosing, copying using or disseminating any of this information or taking any action in reliance on or regarding this information. If you have received this fax in error, please notify us immediately by telephone so that we can arrange for its return to [name]. Phone: [phone], Toll-Free: [phone], Fax: [phone] Page: 2 of 3 Call Id: [id number] [place]. Time Eilene Ghazi Time) Disposition Final User 04/21/2016 6:33:48 PM Paged On Call back to Call [place], [place], [facility] 04/21/2016 6:57:27 PM Call Completed Amalia Hailey, RN, Lenna Sciara 04/21/2016 6:32:39 PM Go to ED Now (or PCP triage) Lanelle Bal, RN, Emelia Salisbury Understands: Yes Disagree/Comply: Comply Care  Advice Given Per Guideline GO TO ED NOW (OR PCP TRIAGE): DRIVING: Another  adult should drive. * IF PCP TRIAGE REQUIRED: You may need to be seen. Your doctor will want to talk with you to decide what's best. I'll page him now. If you haven't heard from the on-call doctor within 30 minutes, go directly to the [facility] at _____________ Hospital. Comments User: Colin Ina, RN Date/Time Eilene Ghazi Time): 04/21/2016 6:33:40 PM Caller requesting the on-call be paged to ask what ER does the spinal patch for spinal headache. Caller advised will page the on-call, if does not get a call back in 30 minutes, call back. Caller verb. understood. Referrals [facility] - ED Paging DoctorName Phone DateTime Result/Outcome Message Type Notes Crissie Sickles - MD [phone] 04/21/2016 6:33:48 PM Paged On Call Back to Call Center Doctor Paged call [facility] [place] Crissie Sickles - MD 04/21/2016 6:57:15 PM Spoke with On Call - General Message Result informed of the pt's request regarding what ER would be able to do Spinal patches ,triaged her symptoms and recommended she go to ER, she was dischg from recent hospitalization Tues thru Friday , has spinal h/a and was informed [facility] would not do the blood patch due to pending UTI results, call not happy with [facility] and asked if the on-call can make a recommendation about ER that did the procedure. Dr. Anitra Lauth reports [facility], [facility] or [facility] are the only hospital that he is aware of , [facility] would not be able to do Blood Patch for spinal headaches. Called the caller back and informed of the physician's recommendations based on what she reported, caller reports she "doesn't know what to do" advised she needs to be seen in ER, depending on her symptoms which she described her pain was 20/10 pain scale, her choice PLEASE NOTE: All timestamps contained within this report are represented as [place]  Standard Time. CONFIDENTIALTY NOTICE: This fax transmission is intended only for the addressee. It contains information that is legally privileged, confidential or otherwise protected from use or disclosure. If you are not the intended recipient, you are strictly prohibited from reviewing, disclosing, copying using or disseminating any of this information or taking any action in reliance on or regarding this information. If you have received this fax in error, please notify us immediately by telephone so that we can arrange for its return to [name]. Phone: [phone], Toll-Free: [phone], Fax: [phone] Page: 3 of 3 Call Id: [id number] Paging DoctorName Phone DateTime Result/Outcome Message Type Notes where she goes and when ultimately. Caller verb. understood.

## 2016-04-24 DIAGNOSIS — R51 Headache: Secondary | ICD-10-CM | POA: Diagnosis not present

## 2016-04-24 DIAGNOSIS — K5000 Crohn's disease of small intestine without complications: Secondary | ICD-10-CM | POA: Diagnosis not present

## 2016-04-24 DIAGNOSIS — F329 Major depressive disorder, single episode, unspecified: Secondary | ICD-10-CM | POA: Diagnosis not present

## 2016-04-24 DIAGNOSIS — G960 Cerebrospinal fluid leak: Secondary | ICD-10-CM | POA: Diagnosis not present

## 2016-04-24 LAB — CBC
HEMATOCRIT: 36.2 % (ref 36.0–46.0)
Hemoglobin: 11.7 g/dL — ABNORMAL LOW (ref 12.0–15.0)
MCH: 28.1 pg (ref 26.0–34.0)
MCHC: 32.3 g/dL (ref 30.0–36.0)
MCV: 86.8 fL (ref 78.0–100.0)
PLATELETS: 502 10*3/uL — AB (ref 150–400)
RBC: 4.17 MIL/uL (ref 3.87–5.11)
RDW: 16.4 % — AB (ref 11.5–15.5)
WBC: 13.8 10*3/uL — AB (ref 4.0–10.5)

## 2016-04-24 MED ORDER — BUTALBITAL-APAP-CAFFEINE 50-325-40 MG PO TABS: 1.0000 | ORAL_TABLET | Freq: Four times a day (QID) | ORAL | Status: DC | PRN

## 2016-04-24 MED ORDER — CYCLOBENZAPRINE HCL 10 MG PO TABS: 5.0000 mg | ORAL_TABLET | Freq: Three times a day (TID) | ORAL | Status: DC | PRN

## 2016-04-24 NOTE — Telephone Encounter (Signed)
Thanks for the update. I called the patient today to reassess her and no answer, I left a message and will call her back again

## 2016-04-24 NOTE — Discharge Summary (Signed)
Physician Discharge Summary  [name] CXK:[id number] DOB: [birth year] DOA: 04/22/2016  PCP: Eliezer Lofts, MD  Admit date: 04/22/2016 Discharge date: 04/24/2016  Time spent: 45 minutes  Recommendations for Outpatient Follow-up:  Patient will be discharged to home.  Patient will need to follow up with primary care provider within one week of discharge.  Patient should continue medications as prescribed.  Patient should follow a regular diet.   Discharge Diagnoses:  Headache/probable CSF leak Serum sickness History of Crohn's disease Depression Leukocytosis  Discharge Condition: Stable  Diet recommendation: regular  Filed Weights   04/22/16 1703  Weight: 64.1 kg (141 lb 5 oz)    History of present illness:  On 04/22/2016 by Dr. Kelvin Cellar [name] is a 40 y.o. female with medical history significant of Crohn's disease, major depression, gastric esophageal reflux disease, endometriosis, who was recently admitted from 04/17/2016 through 04/20/2016 initially presenting with neck stiffness, fever, multiple joint pains. During that hospitalization she was diagnosed with serum sickness after recently being started on infliximab. During that hospitalization she underwent lumbar puncture on 04/17/2016 with CSF fluid analysis unremarkable. Postprocedure she developed severe headache that was positional. Interventional radiology was consulted for placement of blood patch. She was seen and evaluated by Dr. Barbie Banner recommended reevaluation the following week to determine if she needed blood patch. Since discharge she reports severe positional headache, which has essentially kept her bedbound at home. She describes excruciating headaches when having to sit up to use the bathroom. She also complains of lower back pain. She denies fevers chills nausea vomiting dysuria hematuria.  Hospital Course:  Headache/Probable CSF leak -Recently underwent LP on 04/17/2016 due to Complaints of neck stiffness  and multiple joint pain after supposedly Remicade infusion. After the procedure, patient did report positional headaches. -She was evaluated by interventional radiology during the hospitalization, they recommended reevaluation next week to determine whether patient had blood patch. -Admitting physician spoke with interventional radiology, on 04/22/2016 -IR consulted and appreciated -S/p Blood patch -Headache has improved.  -Continue fioricet   Serum sickness -History of Crohn's disease with recent Remicade infusion. Patient had received Remicade approximately 12 years ago and then recently restarted on it. -She developed serum sickness secondary to this, she was recently discharged with a prednisone taper.  History of Crohn's disease -Currently stable -Continue Imuran 150 mg daily  Depression -Continue zoloft  Leukocytosis -Likely reactive/steroids -No complaints of cough or urinary symptoms  Consultants IR  Procedures  IR FL Guided placement of Blood patch L L3/4  Discharge Exam: Filed Vitals:   04/23/16 2124 04/24/16 0608  BP: 128/79 118/69  Pulse: 70 82  Temp: 97.9 F (36.6 C) 97.7 F (36.5 C)  Resp: 18 18   Exam  General: Well developed, well nourished, NAD  HEENT: NCAT, , mucous membranes moist.   Cardiovascular: S1 S2 auscultated, no murmurs, RRR.  Respiratory: Clear to auscultation bilaterally   Abdomen: Soft, nontender, nondistended, + bowel sounds  Extremities: warm dry without cyanosis clubbing or edema  Neuro: AAOx3, nonfocal  Psych: Normal affect and demeanor, pleasant  Discharge Instructions      Discharge Instructions    Discharge instructions    Complete by:  As directed   Patient will be discharged to home.  Patient will need to follow up with primary care provider within one week of discharge.  Patient should continue medications as prescribed.  Patient should follow a regular diet.            Medication List  STOP taking  these medications        hydrocortisone 25 MG suppository  Commonly known as:  ANUSOL-HC      TAKE these medications        acetaminophen 500 MG tablet  Commonly known as:  TYLENOL  Take 1,000 mg by mouth every 6 (six) hours as needed for headache.     albuterol 108 (90 Base) MCG/ACT inhaler  Commonly known as:  PROVENTIL HFA;VENTOLIN HFA  Inhale 2 puffs into the lungs every 6 (six) hours as needed for wheezing or shortness of breath.     ALPRAZolam 0.25 MG tablet  Commonly known as:  XANAX  TAKE 2 TABLETS BY MOUTH EVERY DAY AS NEEDED     azaTHIOprine 50 MG tablet  Commonly known as:  IMURAN  Take 150 mg daily     butalbital-acetaminophen-caffeine 50-325-40 MG tablet  Commonly known as:  FIORICET, ESGIC  Take 1-2 tablets by mouth every 6 (six) hours as needed for headache or migraine.     cyanocobalamin 1000 MCG/ML injection  Commonly known as:  (VITAMIN B-12)  Inject 1,000 mcg into the muscle every 21 ( twenty-one) days.     cyclobenzaprine 10 MG tablet  Commonly known as:  FLEXERIL  Take 0.5-1 tablets (5-10 mg total) by mouth 3 (three) times daily as needed for muscle spasms.     dexlansoprazole 60 MG capsule  Commonly known as:  DEXILANT  Take 1 capsule (60 mg total) by mouth daily.     ondansetron 4 MG tablet  Commonly known as:  ZOFRAN  Take 1 tablet (4 mg total) by mouth every 8 (eight) hours as needed for nausea or vomiting.     PRENATAL PO  Take 2 tablets by mouth every evening. Prenatal gummy vitamin.     ranitidine 150 MG tablet  Commonly known as:  ZANTAC  Take 1 tablet (150 mg total) by mouth every morning.     sertraline 100 MG tablet  Commonly known as:  ZOLOFT  Take 1 tablet (100 mg total) by mouth daily.     zolpidem 10 MG tablet  Commonly known as:  AMBIEN  TAKE ONE TABLET BY MOUTH EVERY NIGHT AT BEDTIME     ZYRTEC ALLERGY PO  Take 1 tablet by mouth daily.       Allergies  Allergen Reactions  . Compazine [Prochlorperazine Edisylate]  Anaphylaxis  . Reglan [Metoclopramide] Anaphylaxis and Other (See Comments)    Other reaction(s): GI Upset (intolerance) Intensifies her Chron's  . Sulfamethoxazole Rash and Other (See Comments)    Internal bleeding and kidney infection  . Biaxin [Clarithromycin] Other (See Comments)    Cant take due to Chrons disease  . Chlorhexidine Other (See Comments)    Blisters after using a couple of times.   . Remicade [Infliximab] Other (See Comments)    Serum Sickness  Couldn't walk, open mouth, pain,   . Codeine Nausea And Vomiting and Other (See Comments)    Dilaudid and demerol OK  . Hydrocodone Nausea And Vomiting  . Macrobid [Nitrofurantoin] Nausea And Vomiting    Other reaction(s): GI Upset (intolerance)  . Other Rash    "chloraprep"  . Promethazine Rash and Other (See Comments)    Muscular seizures  . Sulfa Antibiotics Hives and Other (See Comments)    Hematemesis; documented while a young child   Follow-up Information    Follow up with Eliezer Lofts, MD. Schedule an appointment as soon as possible for a visit in 1 week.  Specialty:  Family Medicine   Why:  Hospital follow up   Contact information:   [place] [address] [phone]        The results of significant diagnostics from this hospitalization (including imaging, microbiology, ancillary and laboratory) are listed below for reference.    Significant Diagnostic Studies: Dg Chest 1 View  04/18/2016  CLINICAL DATA:  Sepsis. EXAM: CHEST 1 VIEW COMPARISON:  07/25/2015 FINDINGS: Normal heart size and mediastinal contours. Event recorder over the left chest. No acute infiltrate or edema. No effusion or pneumothorax. No osseous findings. IMPRESSION: Negative for pneumonia. Electronically Signed   By: Monte Fantasia M.D.   On: 04/18/2016 01:54   Dg Si Joints  04/06/2016  CLINICAL DATA:  Bilateral posterior hip pain radiating to the coccyx ; difficulty sitting, no known injury. EXAM: BILATERAL  SACROILIAC JOINTS - 3+ VIEW COMPARISON:  Coronal and sagittal reconstructed images through the pelvis from an abdominal and pelvic CT scan dated October 27, 2015 FINDINGS: The sacrum is adequately mineralized. There are at least 3 intact sacral struts observed. The SI joints are normal in width. No bony ankylosis or erosive changes are observed. The appearance of the coccyx is normal where visualized. IMPRESSION: There is no acute or significant chronic bony abnormality of the sacrum nor of the SI joints. Electronically Signed   By: David  Martinique M.D.   On: 04/06/2016 12:38   Ct Head Wo Contrast  04/18/2016  CLINICAL DATA:  Headache and fever.  Locked jaw. EXAM: CT HEAD WITHOUT CONTRAST CT NECK WITH CONTRAST TECHNIQUE: Contiguous axial images were obtained from the base of the skull through the vertex without contrast. Multidetector CT imaging of the neck was performed using the standard protocol with intravenous contrast. CONTRAST:  75 cc Isovue 300 intravenous COMPARISON:  03/13/2012 brain MRI FINDINGS: CT HEAD FINDINGS Skull and Sinuses:Negative for fracture or destructive process. The visualized mastoids, middle ears, and imaged paranasal sinuses are clear. Visualized orbits: Negative. Brain: Normal. No evidence of acute infarction, hemorrhage, hydrocephalus, or mass lesion/mass effect. CT NECK FINDINGS Negative larynx and pharynx with no suspicious asymmetry or enhancement. Normal appearance of the salivary and thyroid glands. No lymphadenopathy. Negative vascular structures. No swelling or abnormal enhancement in the muscles of mastication to explain history of locked jaw. Clear apical lungs. No lymphadenopathy in the neck or upper chest. Partial intracranial imaging is negative. Normal appearance of the orbits. No acute sinusitis or mastoiditis. IMPRESSION: Negative head and cervical spine CT.  No explanation for symptoms. Electronically Signed   By: Monte Fantasia M.D.   On: 04/18/2016 02:46   Ct Soft  Tissue Neck W Contrast  04/18/2016  CLINICAL DATA:  Headache and fever.  Locked jaw EXAM: CT HEAD WITHOUT CONTRAST CT NECK WITH CONTRAST TECHNIQUE: Contiguous axial images were obtained from the base of the skull through the vertex without contrast. Multidetector CT imaging of the neck was performed using the standard protocol with intravenous contrast. CONTRAST:  75 cc Isovue 300 intravenous COMPARISON:  None. FINDINGS: CT HEAD FINDINGS Skull and Sinuses:Negative for fracture or destructive process. The visualized mastoids, middle ears, and imaged paranasal sinuses are clear. Visualized orbits: Negative. Brain: No evidence of acute infarction, hemorrhage, hydrocephalus, or mass lesion/mass effect. CT NECK FINDINGS Negative larynx and pharynx with no suspicious asymmetry or enhancement. Normal appearance of the salivary and thyroid glands. No lymphadenopathy. Negative vascular structures. No swelling or abnormal enhancement in the muscles of mastication to explain history of locked jaw.  Clear apical lungs. No lymphadenopathy in the neck or upper chest. Partial intracranial imaging is negative. Normal appearance of the orbits. No acute sinusitis or mastoiditis. IMPRESSION: Negative head and neck CT.  No explanation for symptoms. Electronically Signed   By: Monte Fantasia M.D.   On: 04/18/2016 03:05   Mr Lumbar Spine W Wo Contrast  04/18/2016  CLINICAL DATA:  Initial evaluation for acute low back pain. EXAM: MRI LUMBAR SPINE WITHOUT AND WITH CONTRAST TECHNIQUE: Multiplanar and multiecho pulse sequences of the lumbar spine were obtained without and with intravenous contrast. CONTRAST:  24m MULTIHANCE GADOBENATE DIMEGLUMINE 529 MG/ML IV SOLN COMPARISON:  None. FINDINGS: For the purposes of this dictation, the lowest well-formed intervertebral disc spaces presumed to be the L5-S1 level, and there presumed to be 5 lumbar type vertebral bodies. Vertebral bodies are normally aligned with preservation of the normal  lumbar lordosis. Vertebral body heights are well maintained. No fracture or malalignment. Signal intensity within the vertebral body bone marrow is normal. Tiny probable hemangioma noted within the L4 vertebral body. No other focal osseous lesions. No marrow edema. No abnormal enhancement. Conus medullaris terminates normally at the L1 level. Signal intensity within the visualized cord is normal. Nerve roots of the cauda equina within normal limits. Paraspinous soft tissues within normal limits. No abnormal enhancement. L3-4: Mild degenerative disc desiccation with disc bulge. Anterior endplate osteophytic spurring. No focal disc herniation. No significant stenosis. No other significant degenerative changes identified within the lumbar spine. No other significant disc bulging or focal disc herniations. No significant facet disease. No canal or foraminal stenosis. IMPRESSION: 1. Mild degenerative disc bulge and desiccation at L3-4 without stenosis. 2. Otherwise normal MRI of the lumbar spine. No other significant degenerative changes. No canal or foraminal stenosis. Electronically Signed   By: BJeannine BogaM.D.   On: 04/18/2016 01:27   Ir Fl Guided Loc Of Needl/cath Tip For Spinal Inject Lt  04/23/2016  CLINICAL DATA:  Positional headache consistent with spinal headache. Recent lumbar puncture performed at the bedside. FLUOROSCOPY TIME:  6 second.  One mGy. PROCEDURE: EPIDURAL BLOOD PATCH The procedure, risks, benefits, and alternatives were explained to the patient. Questions regarding the procedure were encouraged and answered. The patient understands and consents to the procedure. 15 cc of blood were withdrawn from the patient's left antecubital fossa utilizing strict sterile technique and Betadine prep. A puncture site was localized in the mid low back. Fluoroscopic imaging demonstrates that this is at the L3-4 level. An epidural approach was taken on the left at L3-4 using a 20 gauge Crawford epidural  needle. Epidural positioning was confirmed by injecting a small amount of Omnipaque 180. There was no vascular communication. 15 cc of the patient's blood was slowly injected into the epidural space in this location. The procedure was well-tolerated and she was discharged in good condition with instructions to lie down for additional day. COMPLICATIONS: None IMPRESSION: Lumbar epidural blood patch on the left at L3-4. Electronically Signed   By: AMarybelle KillingsM.D.   On: 04/23/2016 16:44    Microbiology: Recent Results (from the past 240 hour(s))  Blood Culture (routine x 2)     Status: None   Collection Time: 04/17/16 11:45 PM  Result Value Ref Range Status   Specimen Description BLOOD LEFT ARM  Final   Special Requests BOTTLES DRAWN AEROBIC AND ANAEROBIC 5ML  Final   Culture NO GROWTH 5 DAYS  Final   Report Status 04/23/2016 FINAL  Final  Blood Culture (  routine x 2)     Status: None   Collection Time: 04/17/16 11:55 PM  Result Value Ref Range Status   Specimen Description BLOOD RIGHT ARM  Final   Special Requests BOTTLES DRAWN AEROBIC AND ANAEROBIC 5ML  Final   Culture NO GROWTH 5 DAYS  Final   Report Status 04/23/2016 FINAL  Final  Urine culture     Status: Abnormal   Collection Time: 04/18/16 12:00 AM  Result Value Ref Range Status   Specimen Description URINE, CLEAN CATCH  Final   Special Requests NONE  Final   Culture MULTIPLE SPECIES PRESENT, SUGGEST RECOLLECTION (A)  Final   Report Status 04/19/2016 FINAL  Final  CSF culture     Status: None   Collection Time: 04/18/16  3:20 AM  Result Value Ref Range Status   Specimen Description CSF  Final   Special Requests NONE  Final   Gram Stain   Final    CYTOSPIN SMEAR WBC PRESENT, PREDOMINANTLY MONONUCLEAR NO ORGANISMS SEEN    Culture NO GROWTH 3 DAYS  Final   Report Status 04/21/2016 FINAL  Final  Culture, Urine     Status: None   Collection Time: 04/20/16  4:47 PM  Result Value Ref Range Status   Specimen Description URINE,  CLEAN CATCH  Final   Special Requests NONE  Final   Culture NO GROWTH 2 DAYS  Final   Report Status 04/22/2016 FINAL  Final     Labs: Basic Metabolic Panel:  Recent Labs Lab 04/17/16 2345 04/18/16 0450 04/19/16 0941 04/22/16 1212 04/23/16 0737  NA 141 139 141 141 140  K 3.7 3.3* 4.0 3.5 4.3  CL 110 112* 110 106 108  CO2 20* 18* 19* 25 23  GLUCOSE 117* 90 208* 111* 138*  BUN 12 8 8 16 14   CREATININE 0.83 0.60 0.55 0.66 0.46  CALCIUM 8.7* 7.4* 9.0 8.8* 8.6*  PHOS  --   --  2.1*  --   --    Liver Function Tests:  Recent Labs Lab 04/17/16 2345 04/19/16 0941 04/22/16 1212  AST 19  --  30  ALT 14  --  34  ALKPHOS 67  --  75  BILITOT 0.3  --  0.1*  PROT 6.7  --  6.9  ALBUMIN 3.6 3.3* 3.7   No results for input(s): LIPASE, AMYLASE in the last 168 hours. No results for input(s): AMMONIA in the last 168 hours. CBC:  Recent Labs Lab 04/17/16 2345  04/19/16 0841 04/20/16 0304 04/22/16 1212 04/23/16 0737 04/24/16 0538  WBC 17.7*  < > 10.7* 8.7 8.8 11.2* 13.8*  NEUTROABS 14.4*  --  9.6*  --  5.4  --   --   HGB 12.1  < > 11.4* 10.0* 13.0 13.1 11.7*  HCT 37.9  < > 35.2* 31.4* 38.5 38.2 36.2  MCV 88.3  < > 85.9 87.2 83.5 83.4 86.8  PLT 347  < > 355 348 506* 526* 502*  < > = values in this interval not displayed. Cardiac Enzymes:  Recent Labs Lab 04/17/16 2345  TROPONINI <0.03   BNP: BNP (last 3 results) No results for input(s): BNP in the last 8760 hours.  ProBNP (last 3 results) No results for input(s): PROBNP in the last 8760 hours.  CBG:  Recent Labs Lab 04/18/16 1002 04/19/16 0933 04/20/16 0611  GLUCAP 114* 203* 106*       Signed:  Cristal Ford  [facility] 04/24/2016, 10:40 AM

## 2016-04-24 NOTE — Discharge Instructions (Signed)
Epidural Blood Patch for Spinal Headache °An epidural blood patch is a procedure that is used to treat a headache that occurs when there is a leak of spinal fluid. This type of headache is called a spinal headache or post-dural puncture headache. Spinal headaches sometimes occur after a person undergoes a type of anesthesia called epidural anesthesia or after a lumbar puncture (also called a spinal tap).  °Generally, an epidural blood patch is done when a spinal headache has not been relieved by 2-3 days of:  °· Bed rest.   °· Drinking lots of fluids.   °· Taking oral medicines for pain, including nonsteroidal anti-inflammatory agents and caffeine. °It may also be done to treat a person who has had epidural anesthesia and is experiencing:  °· Neck pain and stiffness that are very severe and associated with vomiting.   °· Hearing loss.   °· Double vision.   °An epidural blood patch is not done when:  °· Your headache is due to an infection in the lower back (lumbar) area or the blood.   °· You have bleeding tendencies.   °· You are taking certain blood-thinning medicines. °LET YOUR HEALTH CARE PROVIDER KNOW ABOUT: °· Any allergies you have.   °· All medicines you are taking, including vitamins, herbs, eye drops, creams, and over-the-counter medicines.   °· Previous problems you or members of your family have had with the use of anesthetics.   °· Any blood disorders you have.   °· Previous surgeries you have had.   °· Medical conditions you have.   °RISKS AND COMPLICATIONS °Generally, an epidural blood patch is a safe procedure. However, as with any procedure, complications can occur. Possible complications include:  °· Backache. °· Nerve pain, tingling, or numbness. °· Bleeding. °· Infection. °Complications are more likely to occur in people who have bleeding disorders or infections. °BEFORE THE PROCEDURE °· Drink a lot of water the day before your procedure. °· Make sure your health care provider knows about all  medicines and dietary or herbal supplements that you are taking. Take them as directed and find out if you need to stop any of them prior to the procedure. °· Follow your health care provider's instructions for when to stop eating and drinking. °· Arrange for someone to drive you to and from the procedure. °PROCEDURE  °· You will have two IV lines placed--one to give you fluids and medicines during the procedure and one to withdraw blood for the patch. °· You will lie on your stomach. °· An X-ray machine will take pictures of your back to locate the area of leakage. °· Dye may be injected so that the area can be seen well on an X-ray. °· Blood will be drawn from your arm and injected into the leaking area. When the blood is injected, you may feel tightness in your buttocks, lower back, or thighs. °AFTER THE PROCEDURE  °You will be expected to lie on your back for 2-4 hours with some pillows under your knees. It is important to lie still while on your back so that a good clot can form. You should also avoid any straining, especially right after the procedure.  °Most people obtain almost instant relief from the spinal headache. In some, the relief comes on gradually over a 24-hour period. Some people experience mild backaches for a few days. In a few cases, people also have a mild, passing sensation of prickly or tingly skin (paresthesia), neck pain, or nerve-root pain. °  °This information is not intended to replace advice given to you by   your health care provider. Make sure you discuss any questions you have with your health care provider. °  °Document Released: 06/01/2002 Document Revised: 09/30/2013 Document Reviewed: 07/22/2013 °Elsevier Interactive Patient Education ©2016 [employer] ° °

## 2016-04-24 NOTE — Progress Notes (Signed)
Pt discharged with mother in law. Discharge instructions given and all questions answered.

## 2016-04-25 NOTE — Telephone Encounter (Signed)
Called patient and discussed he recent hospital course. She unfortunately developed serum sickness from Remicade infusion. In this light cannot use any anti-TNFs moving forward given reactions to both Humira and Remicade now. Recommend Vedolizumab moving forward for her Crohns therapy, will hold off on starting this for at least several weeks until she has recovered from this issue. Rollene Fare I will be seeing this patient in a few weeks for reassessment in the office. Can you run Vedolizumab through her insurance to assess if it's covered? Thanks

## 2016-04-26 ENCOUNTER — Telehealth: Payer: Self-pay

## 2016-04-26 MED ORDER — FLUCONAZOLE 150 MG PO TABS: 150.0000 mg | ORAL_TABLET | Freq: Once | ORAL | Status: DC

## 2016-04-26 NOTE — Telephone Encounter (Signed)
Sent to [employer] to verify insurance coverage.

## 2016-04-26 NOTE — Telephone Encounter (Signed)
[facility] notified that Diflucan prescription has been sent into [facility] as requested.

## 2016-04-26 NOTE — Telephone Encounter (Signed)
Rx sent 

## 2016-04-26 NOTE — Telephone Encounter (Signed)
Pt left v/m; pt was recently in hospital taking IV abx and now has yeast infection; pt request 2  Diflucan tabs to [facility]. Pt request cb.

## 2016-05-01 ENCOUNTER — Telehealth: Payer: Self-pay | Admitting: *Deleted

## 2016-05-01 ENCOUNTER — Telehealth: Payer: Self-pay | Admitting: Gastroenterology

## 2016-05-01 NOTE — Telephone Encounter (Signed)
Left a message for patient to call back. 

## 2016-05-01 NOTE — Telephone Encounter (Signed)
-----  Message from Darden Dates sent at 04/26/2016 10:14 AM EDT ----- [employer] SAYS IT WOULD FALL UNDER DED THAT IS $1250.  AT THIS TIME THAT HAS BEEN MET.  SO SHE WOULD BE RESPONSIBLE FOR 20% COINSURANCE.  HER OOP MAX IS $4350.  SHE HAS MET $2079 AS OF RIGHT NOW.  AFTER HER OOP MAX HAS BEEN MET THEY WILL PAY 100%.  AFTER THE FIRST OF THE YEAR HER DED STARTS OVER.  IF SHE DECIDES TO GO WITH ENTYVIO, IT WILL NEED A PRECERT. THANKS AMY ----- Message -----    From: Hulan Saas, RN    Sent: 04/26/2016   9:35 AM      To: Darden Dates  Amy, Dr. Havery Moros wants to start this patient on Entyvio in the next few weeks. He wants to see if her insurance will cover and how much OOP she will have. She has had reactions to Humira and Remicade. Rollene Fare

## 2016-05-01 NOTE — Telephone Encounter (Signed)
Thanks [name], Can you let the patient know what her insurance says. I think this medication is her best option if she can afford it. I will further discuss with her in the clinic but if she is agreeable to it we should move forward with getting approval. Thanks

## 2016-05-01 NOTE — Telephone Encounter (Signed)
Dr. Havery Moros, see below for patient's insurance coverage for [facility].

## 2016-05-02 NOTE — Telephone Encounter (Signed)
She has a history of uveitis and iritis associated with her Crohn's in the past, does it feel like this to her, from what she has experienced in the past? Is her vision okay? If worsening after hours or she can't see she may have to go to the ER. Otherwise, any way we can get her to see Opthalmology tomorrow? I would hold on tapering prednisone until she has this evaluated.

## 2016-05-02 NOTE — Telephone Encounter (Signed)
Spoke with patient and she has been seen at [facility] but her MD retired. She reports a blood vessel has popped in her left eye. Called the on call opthalmology group and spoke with Dr. Armanda Heritage. [phone]). Patient scheduled with him tomorrow at 4:30 PM.  [address] [employer] building. Patient aware of appointment.

## 2016-05-02 NOTE — Telephone Encounter (Signed)
Spoke with patient and she is agrees to trying the [facility] when Dr. Havery Moros wants her too. Do you want me to go ahead with the PA?

## 2016-05-02 NOTE — Telephone Encounter (Signed)
Spoke with patient and she states she has decreased her Prednisone from 40 mg to 30 mg. This is day # 3 of the 30 mg and she is to decrease it again tomorrow. She states she is having eye pain and eye sensitivity. She is asking what she should do. Please, advise.

## 2016-05-02 NOTE — Telephone Encounter (Signed)
Thanks very much.

## 2016-05-02 NOTE — Telephone Encounter (Signed)
Yes I think this would be the next best medication for her. I want to be further out from her Remicade reaction prior to starting this however, likely in the upcoming weeks or so.

## 2016-05-03 NOTE — Telephone Encounter (Signed)
Amy Mason Jim is working on [place].

## 2016-05-10 ENCOUNTER — Telehealth: Payer: Self-pay | Admitting: *Deleted

## 2016-05-10 NOTE — Telephone Encounter (Signed)
Left a message for patient to call back. 

## 2016-05-10 NOTE — Telephone Encounter (Signed)
-----   Message from Manus Gunning, MD sent at 05/09/2016  1:35 PM EDT ----- That's great thanks. Any follow up on her eye condition? Once she is stable we can start this in a few weeks . I will need to speak with her prior to starting it   ----- Message -----    From: Hulan Saas, RN    Sent: 05/09/2016  10:56 AM      To: Manus Gunning, MD  Dr. Havery Moros, We have a prior auth through 12/17 for Entyvio . Rollene Fare

## 2016-05-11 ENCOUNTER — Ambulatory Visit (INDEPENDENT_AMBULATORY_CARE_PROVIDER_SITE_OTHER): Payer: BC Managed Care – PPO | Admitting: Gastroenterology

## 2016-05-11 ENCOUNTER — Other Ambulatory Visit (INDEPENDENT_AMBULATORY_CARE_PROVIDER_SITE_OTHER): Payer: BC Managed Care – PPO

## 2016-05-11 ENCOUNTER — Encounter: Payer: Self-pay | Admitting: Gastroenterology

## 2016-05-11 VITALS — BP 102/60 | HR 80 | Ht 64.0 in | Wt 147.0 lb

## 2016-05-11 DIAGNOSIS — H209 Unspecified iridocyclitis: Secondary | ICD-10-CM

## 2016-05-11 DIAGNOSIS — M545 Low back pain, unspecified: Secondary | ICD-10-CM

## 2016-05-11 DIAGNOSIS — K50919 Crohn's disease, unspecified, with unspecified complications: Secondary | ICD-10-CM | POA: Diagnosis not present

## 2016-05-11 DIAGNOSIS — Z79899 Other long term (current) drug therapy: Secondary | ICD-10-CM

## 2016-05-11 LAB — APTT: aPTT: 24.8 s (ref 23.4–32.7)

## 2016-05-11 LAB — CBC WITH DIFFERENTIAL/PLATELET
BASOS ABS: 0.1 10*3/uL (ref 0.0–0.1)
BASOS PCT: 0.5 % (ref 0.0–3.0)
EOS PCT: 1 % (ref 0.0–5.0)
Eosinophils Absolute: 0.1 10*3/uL (ref 0.0–0.7)
HEMATOCRIT: 37.5 % (ref 36.0–46.0)
Hemoglobin: 12.5 g/dL (ref 12.0–15.0)
LYMPHS PCT: 21.3 % (ref 12.0–46.0)
Lymphs Abs: 2.6 10*3/uL (ref 0.7–4.0)
MCHC: 33.5 g/dL (ref 30.0–36.0)
MCV: 87 fl (ref 78.0–100.0)
MONOS PCT: 4.2 % (ref 3.0–12.0)
Monocytes Absolute: 0.5 10*3/uL (ref 0.1–1.0)
NEUTROS ABS: 8.8 10*3/uL — AB (ref 1.4–7.7)
Neutrophils Relative %: 73 % (ref 43.0–77.0)
PLATELETS: 313 10*3/uL (ref 150.0–400.0)
RBC: 4.31 Mil/uL (ref 3.87–5.11)
RDW: 16.2 % — ABNORMAL HIGH (ref 11.5–15.5)
WBC: 12.1 10*3/uL — ABNORMAL HIGH (ref 4.0–10.5)

## 2016-05-11 LAB — COMPREHENSIVE METABOLIC PANEL
ALK PHOS: 68 U/L (ref 39–117)
ALT: 11 U/L (ref 0–35)
AST: 10 U/L (ref 0–37)
Albumin: 4 g/dL (ref 3.5–5.2)
BILIRUBIN TOTAL: 0.3 mg/dL (ref 0.2–1.2)
BUN: 14 mg/dL (ref 6–23)
CALCIUM: 8.8 mg/dL (ref 8.4–10.5)
CO2: 29 meq/L (ref 19–32)
Chloride: 105 mEq/L (ref 96–112)
Creatinine, Ser: 0.63 mg/dL (ref 0.40–1.20)
GFR: 111.05 mL/min (ref >60.00)
Glucose, Bld: 112 mg/dL — ABNORMAL HIGH (ref 70–99)
POTASSIUM: 3.7 meq/L (ref 3.5–5.1)
Sodium: 140 mEq/L (ref 135–145)
TOTAL PROTEIN: 6.1 g/dL (ref 6.0–8.3)

## 2016-05-11 LAB — PROTIME-INR
INR: 1 ratio (ref 0.8–1.0)
Prothrombin Time: 10.5 s (ref 9.6–13.1)

## 2016-05-11 NOTE — Patient Instructions (Signed)
Please call your dermatology office to schedule an appointment.  We will contact you about your referral to physical therapy.   Your physician has requested that you go to the basement for the  lab work before leaving today.  We have given you information to read and review about Entyvio.  Thank you for choosing [facility]  Dr Richland Cellar

## 2016-05-11 NOTE — Progress Notes (Signed)
HPI :  CROHNS HISTORY 40 y/o female with ileocolonic stricturing / perforating Crohn's disease  Ileocolonic Crohns - diagnosed in 2002. She reported she presented with a bowel obstruction with perforation and had surgery upon diagnosis and had 2 surgeries within the first year of diagnosis. She reports a history of abscess formation and fistula development (enteroenteral fistulas?). She was eventually placed on Remicade, only for a short period of time, perhaps 1-2 infusions - she was uncertain if it worked or not, but stopped it for unclear reasons. She had been on 6MP or steroids over time otherwise, she states she has intermittently been on 6MP. She has been on mesalamines in the past which did not help. She has been treated for bacterial overgrowth over time given ileocecectomy. She has had 2 surgeries total for her Crohns. She has been previously treated with Humira which appeared to put her disease into remission, however developed severe arthralgias, evaluated by Rheumatology who thought it was related to Humira, which was stopped and her symptoms resolved. She was then given a dose of Remicade and developed a serum sickness which led to hospital admission, treated with steroids.   She otherwise has a history of uveitis and iritis with Cronhns flares. She is seeing opthalmology in [facility] for this. She reports she also has significant back pain and arthralgias due to Crohns flares. She takes tramadol for pain, does not NSAIDs. Her pregnancy was complicated by PE x 2. Was on coumadin but now off all anticoagulants. She has been on IV iron in the past.   SINCE LAST VISIT:  She received one dose of Remicade since the last visit on April 14th, and was admitted with a serum sickness reaction. She had an LP done as part of the workup and had a headache from this requiring blood patch. She has recovered  from this but developed eye pain recently. She was seen by Dr. Posey Pronto with Opthalmology.  She reports she was diagnosed with uveitis, and some evidence of iritis, but mild. He was told to taper the steroids more slowly. She is on 57m of prednisone, and tapering by 538mevery 4 days or so. She has a follow up scheduled, generally her eye pain is significantly improved at this time.   She is taking 1502mmuran daily. She is tolerating it well. In reagrds to her Crohns symptoms she continues to have some abdominal discomfort at times. She reports an hour after she eats she has some pain in the RLQ. She is having roughly 1 BM per day or so. She is having a mild amount of blood in the stools. She has intermittent blood in the stools which can come and go. She reports her joints feel okay. She thinks her legs are sore and feel shakey, she thinks ongoing for a week or so. She also has been having some easy bruising on her arms and legs. This has been ongoing for a week or so. Headache resolved. She otherwise has some ongoing back pain, had a lumbar MRI showing disk bulge. No sacroileitis.  Last colonoscopy 10/2015 - the colon was healthy in appearance without inflammatory changes. very mild loss of vascularity noted in the sigmoid colon but no ulcerations or erosions. The surgical anastomosis was widely patent and appeared healthy with 3 apthous ulcerations noted. Rutgeerts' score of i1. The ileum was intubated and appeared healthy without inflammatory changes. Mild diverticulosis was noted in the left colon. Retroflexed views revealed internal hemorrhoids.  IBD Health Care  Maintenance: Annual Flu Vaccine - 2016 UTD  Pneumococcal Vaccine if receiving immunosuppression: - 2016 Pap Smear annually if immunosuppressed - Date - DUE for this at present time  Skin check / derm eval for those on biologic / thiopuriner, yearly: Date : DUE for this TB testing if on anti-TNF, yearly - October 2016 Vitamin D screening -- due Last Colonoscopy - 10/2015  Past Medical History  Diagnosis Date  . Crohn  disease ([place]) 2000  . Asthma   . Endometriosis   . Dysautonomia     recurrent syncope s/p ILR by Dr Doreatha Lew  . Palpitations   . Anxiety   . GERD (gastroesophageal reflux disease)   . Vertigo   . H/O hiatal hernia   . Chest pain     a. s/p intermediate risk nuclear stress test on 09/23/14 and normal LHC on 09/24/14   . Chronic nausea   . Migraine   . Pulmonary embolism affecting pregnancy   . Anginal pain ([place]) only on 09/22/2014     Past Surgical History  Procedure Laterality Date  . Ileocecetomy  2002 X 2  . Loop recorder implant  11/2009    by DR Doreatha Lew  . Knee arthroscopy Bilateral 1988-1990  . Cesarean section  2011    emergency  CS due to placental abruption  . Partial colectomy  2002    partial removed due to crohns  . Appendectomy  ~ 1992  . Tonsillectomy  ~ 1996  . US echocardiography  11/11/2009    EF 55-60%  . Laparoscopy for ectopic pregnancy  11/2012  . Nasal sinus surgery  ~ 2007  . Endoscopic release transverse carpal ligament of hand Right 12/2010  . Colon surgery    . Cystoscopy w/ stone manipulation  X 2  . Cesarean section  2014    "preeclampsia"  . Left heart catheterization with coronary angiogram N/A 09/24/2014    Procedure: LEFT HEART CATHETERIZATION WITH CORONARY ANGIOGRAM;  Surgeon: Leonie Man, MD;  Location: [facility] C[facility] LAB;  Service: Cardiovascular;  Laterality: N/A;   Family History  Problem Relation Age of Onset  . Hypertension Mother   . Hyperlipidemia Mother   . Arthritis Mother   . Diabetes Father   . Coronary artery disease Father   . Heart disease Father 10    MI age 14  . Hyperlipidemia Brother   . Hypertension Brother   . Colon cancer Paternal Grandmother    Social History  Substance Use Topics  . Smoking status: Never Smoker   . Smokeless tobacco: Never Used  . Alcohol Use: Yes   Current Outpatient Prescriptions  Medication Sig Dispense Refill  . acetaminophen (TYLENOL) 500 MG tablet Take 1,000 mg by mouth every 6 (six)  hours as needed for headache.    . albuterol (PROVENTIL HFA;VENTOLIN HFA) 108 (90 BASE) MCG/ACT inhaler Inhale 2 puffs into the lungs every 6 (six) hours as needed for wheezing or shortness of breath. 1 Inhaler 2  . ALPRAZolam (XANAX) 0.25 MG tablet TAKE 2 TABLETS BY MOUTH EVERY DAY AS NEEDED (Patient taking differently: TAKE 2 TABLETS BY MOUTH EVERY DAY AS NEEDED FOR ANXIETY.) 30 tablet 0  . azaTHIOprine (IMURAN) 50 MG tablet Take 150 mg daily 90 tablet 2  . butalbital-acetaminophen-caffeine (FIORICET, ESGIC) 50-325-40 MG tablet Take 1-2 tablets by mouth every 6 (six) hours as needed for headache or migraine. 30 tablet 0  . Cetirizine HCl (ZYRTEC ALLERGY PO) Take 1 tablet by mouth daily.     Marland Kitchen  cyanocobalamin (,VITAMIN B-12,) 1000 MCG/ML injection Inject 1,000 mcg into the muscle every 21 ( twenty-one) days.    . cyclobenzaprine (FLEXERIL) 10 MG tablet Take 0.5-1 tablets (5-10 mg total) by mouth 3 (three) times daily as needed for muscle spasms. 30 tablet 0  . dexlansoprazole (DEXILANT) 60 MG capsule Take 1 capsule (60 mg total) by mouth daily. 30 capsule 5  . Prenatal Vit-Fe Fumarate-FA (PRENATAL PO) Take 2 tablets by mouth every evening. Prenatal gummy vitamin.    . ranitidine (ZANTAC) 150 MG tablet Take 1 tablet (150 mg total) by mouth every morning. 30 tablet 3  . sertraline (ZOLOFT) 100 MG tablet Take 1 tablet (100 mg total) by mouth daily. 30 tablet 3  . zolpidem (AMBIEN) 10 MG tablet TAKE ONE TABLET BY MOUTH EVERY NIGHT AT BEDTIME 30 tablet 1   No current facility-administered medications for this visit.   Allergies  Allergen Reactions  . Compazine [Prochlorperazine Edisylate] Anaphylaxis  . Reglan [Metoclopramide] Anaphylaxis and Other (See Comments)    Other reaction(s): GI Upset (intolerance) Intensifies her Chron's  . Sulfamethoxazole Rash and Other (See Comments)    Internal bleeding and kidney infection  . Biaxin [Clarithromycin] Other (See Comments)    Cant take due to Chrons  disease  . Chlorhexidine Other (See Comments)    Blisters after using a couple of times.   . Remicade [Infliximab] Other (See Comments)    Serum Sickness  Couldn't walk, open mouth, pain,   . Codeine Nausea And Vomiting and Other (See Comments)    Dilaudid and demerol OK  . Hydrocodone Nausea And Vomiting  . Macrobid [Nitrofurantoin] Nausea And Vomiting    Other reaction(s): GI Upset (intolerance)  . Other Rash    "chloraprep"  . Promethazine Rash and Other (See Comments)    Muscular seizures  . Sulfa Antibiotics Hives and Other (See Comments)    Hematemesis; documented while a young child     Review of Systems: All systems reviewed and negative except where noted in HPI.   Lab Results  Component Value Date   WBC 13.8* 04/24/2016   HGB 11.7* 04/24/2016   HCT 36.2 04/24/2016   MCV 86.8 04/24/2016   PLT 502* 04/24/2016   Lab Results  Component Value Date   ALT 34 04/22/2016   AST 30 04/22/2016   ALKPHOS 75 04/22/2016   BILITOT 0.1* 04/22/2016   Lab Results  Component Value Date   CREATININE 0.46 04/23/2016   BUN 14 04/23/2016   NA 140 04/23/2016   K 4.3 04/23/2016   CL 108 04/23/2016   CO2 23 04/23/2016       Physical Exam: BP 102/60 mmHg  Pulse 80  Ht 5' 4"  (1.626 m)  Wt 147 lb (66.679 kg)  BMI 25.22 kg/m2  LMP 05/11/2016 Constitutional: Pleasant,well-developed, female in no acute distress. HEENT: Normocephalic and atraumatic. Conjunctivae are normal. No scleral icterus. Neck supple.  Cardiovascular: Normal rate, regular rhythm.  Pulmonary/chest: Effort normal and breath sounds normal. No wheezing, rales or rhonchi. Abdominal: Soft, nondistended, nontender. Bowel sounds active throughout. There are no masses palpable. No hepatomegaly. Extremities: no edema, no focal soreness, 2-3 small mild areas of ecchymosis on L thigh noted Lymphadenopathy: No cervical adenopathy noted. Neurological: Alert and oriented to person place and time. Skin: Skin is warm  and dry. No rashes noted. Psychiatric: Normal mood and affect. Behavior is normal.   ASSESSMENT AND PLAN: 40 y/o female with longstanding ileocolonic stricturing / penetrating Crohn's, history of 2 bowel resections, complicated  by extraintestinal manifestations to include iritis / uveitis and joint pains. History as above. On 6MP intermittently although limited by severe nausea with this, multiple courses of steroids. She had been on Humira since early October 2016. Colonoscopy in November showed excellent control of disease with no significant inflammatory changes. Unfortunately she developed debilitating arthralgias while on Humira and this was ultimately stopped following consultation with Rheumatology. Given her breastfeeding history we could not use methotrexate, and proceeded with resuming thiopurine and now on Imuran 139m daily and tolerating it okay. Since the last visit we tried her on Remicade and she had a serum sickness reaction which required hospitalization and treated with steroids. She has recovered from this, but developed flare of uveitis while tapering prednisone, now on a slow taper.    Moving forward we discussed options to treat her Crohn's. Generally her symptoms are pretty well controlled at this time as she appeared to have no significant inflammation on last imaging and colonoscopy, when on Humira, however given her history she warrants combination therapy as I don't think Imuran monotherapy will keep her in remission. Given her reaction to Humira and Remicade, I would avoid all further anti-TNF agents at this time. I think EWeyman Rodneyis a great option for her at present. I discussed the risks / benefits of it at length, and she wished to proceed. We will wait a few more weeks to allow her to recover from the Remicade reaction. She will continue Imuran and prednisoen taper at present. I will send baseline CBC, coags, CMP today given her other symptoms to ensure normal, as well as CK  level given her muscle soreness.   Regarding health care maintenance, she will be referred to Dermatology for yearly skin check and recommended she follow up with primary care for PAP smear. We will add on vitamin D as part of her next lab draw.  Regarding her low back pain, will refer to PT, recommend she avoid NSAID use.   I will await labs and plan on starting Entyvio in the upcoming weeks. She agreed  SCarolina Cellar MD LUniversity Medical Service Association [facility]sf [facility] Surgery CenterGastroenterology Pager [phone]

## 2016-05-12 ENCOUNTER — Encounter: Payer: Self-pay | Admitting: Gastroenterology

## 2016-05-12 LAB — CK TOTAL AND CKMB (NOT AT ARMC): Total CK: 34 U/L (ref 7–177)

## 2016-05-14 ENCOUNTER — Telehealth: Payer: Self-pay | Admitting: *Deleted

## 2016-05-14 ENCOUNTER — Encounter: Payer: Self-pay | Admitting: *Deleted

## 2016-05-14 ENCOUNTER — Other Ambulatory Visit: Payer: Self-pay | Admitting: *Deleted

## 2016-05-14 DIAGNOSIS — K50119 Crohn's disease of large intestine with unspecified complications: Secondary | ICD-10-CM

## 2016-05-14 LAB — VITAMIN D 25 HYDROXY (VIT D DEFICIENCY, FRACTURES): VITD: 15.99 ng/mL — ABNORMAL LOW (ref 30.00–100.00)

## 2016-05-14 MED ORDER — VITAMIN D (ERGOCALCIFEROL) 1.25 MG (50000 UNIT) PO CAPS: ORAL_CAPSULE | ORAL | Status: DC

## 2016-05-14 NOTE — Telephone Encounter (Signed)
Patient had OV.

## 2016-05-14 NOTE — Telephone Encounter (Signed)
Message     [name] when I examined her in the clinic I did not appreciate any edema / swelling ,calf pain, or clinical evidence of a clot. Are her legs bothering her just in the calves or the entire leg? I thought it was her thighs and lower legs and seemed less likely to be a DVT, however if he calves are bothering her given her history we can obtain an [name] to ensure normal. Can you touch base with her about it? Thanks       Left a message for patient to call back.

## 2016-05-15 ENCOUNTER — Other Ambulatory Visit (INDEPENDENT_AMBULATORY_CARE_PROVIDER_SITE_OTHER): Payer: BC Managed Care – PPO

## 2016-05-15 DIAGNOSIS — K50919 Crohn's disease, unspecified, with unspecified complications: Secondary | ICD-10-CM | POA: Diagnosis not present

## 2016-05-15 LAB — MAGNESIUM: MAGNESIUM: 1.7 mg/dL (ref 1.5–2.5)

## 2016-05-15 NOTE — Telephone Encounter (Signed)
Added Magnesium to previous labs. Faxed add on to lab.

## 2016-05-15 NOTE — Telephone Encounter (Signed)
Okay, given its both legs and in the thighs, DVT is very unlikely and she had no evidence of this on exam. Any way we can add Magensium level on to her recent labs? If not we can check this, although she may need to see her primary care for further evaluation of this. Not sure if this is related to the Remicade reaction, if so, should get better with time. Thanks. Prior CK level was normal.

## 2016-05-15 NOTE — Telephone Encounter (Signed)
Spoke with patient and confirmed that her leg pain is thighs and lower legs not  calf pain.

## 2016-05-25 ENCOUNTER — Encounter: Payer: Self-pay | Admitting: Family Medicine

## 2016-05-25 MED ORDER — AZITHROMYCIN 250 MG PO TABS: ORAL_TABLET | ORAL | Status: DC

## 2016-05-25 NOTE — Telephone Encounter (Signed)
Sent in rx for antibitoics. Allergy list says she does not tolerate hydrocodone or codeine. Text me with her response about what cough med she tolerates so I can given the okay for a prescription to be sent in, OK?

## 2016-05-28 MED ORDER — GUAIFENESIN-CODEINE 100-10 MG/5ML PO SYRP: 5.0000 mL | ORAL_SOLUTION | Freq: Every evening | ORAL | Status: DC | PRN

## 2016-05-28 NOTE — Telephone Encounter (Signed)
Please call in med.. She says she tolerates it despite it being on her allergy list.

## 2016-05-28 NOTE — Telephone Encounter (Signed)
Robitussin AC called into [facility].

## 2016-06-04 ENCOUNTER — Other Ambulatory Visit: Payer: Self-pay | Admitting: Family Medicine

## 2016-06-04 ENCOUNTER — Other Ambulatory Visit: Payer: Self-pay | Admitting: Gastroenterology

## 2016-06-04 NOTE — Telephone Encounter (Signed)
Last office visit 03/02/2016.  Ok to refill?

## 2016-06-05 NOTE — Telephone Encounter (Signed)
Alprazolam and Zolpidem called into [facility].

## 2016-06-06 ENCOUNTER — Encounter: Payer: Self-pay | Admitting: Gastroenterology

## 2016-06-06 ENCOUNTER — Other Ambulatory Visit: Payer: Self-pay | Admitting: Gastroenterology

## 2016-06-06 ENCOUNTER — Telehealth: Payer: Self-pay | Admitting: Gastroenterology

## 2016-06-06 DIAGNOSIS — K5090 Crohn's disease, unspecified, without complications: Secondary | ICD-10-CM

## 2016-06-06 MED ORDER — BUDESONIDE 3 MG PO CPEP: ORAL_CAPSULE | ORAL | Status: DC

## 2016-06-06 NOTE — Telephone Encounter (Signed)
email sent to Dr. Michel Bickers, pt aware.

## 2016-06-15 ENCOUNTER — Encounter (HOSPITAL_COMMUNITY): Payer: Self-pay

## 2016-06-15 ENCOUNTER — Encounter (HOSPITAL_COMMUNITY)
Admission: RE | Admit: 2016-06-15 | Discharge: 2016-06-15 | Disposition: A | Discharge status: Home / Self Care | Payer: BC Managed Care – PPO | Source: Ambulatory Visit | Attending: Gastroenterology | Admitting: Gastroenterology

## 2016-06-15 VITALS — BP 123/82 | HR 72 | Temp 98.7°F | Resp 18 | Ht 64.0 in | Wt 147.0 lb

## 2016-06-15 DIAGNOSIS — K50119 Crohn's disease of large intestine with unspecified complications: Secondary | ICD-10-CM

## 2016-06-15 DIAGNOSIS — D509 Iron deficiency anemia, unspecified: Secondary | ICD-10-CM | POA: Diagnosis present

## 2016-06-15 MED ORDER — VEDOLIZUMAB 300 MG IV SOLR
300.0000 mg | Freq: Once | INTRAVENOUS | Status: AC
  Administered 2016-06-15: 300 mg via INTRAVENOUS
  Filled 2016-06-15: qty 5

## 2016-06-15 MED ORDER — SODIUM CHLORIDE 0.9 % IV SOLN
Freq: Once | INTRAVENOUS | Status: AC
  Administered 2016-06-15: 12:00:00 via INTRAVENOUS

## 2016-06-15 NOTE — Progress Notes (Signed)
Uneventful first infusion of ENTYVIO after 25 minute observation post infusion. Pt had a headache upon arrival prior to infusion and still has it at discharge. She took one of her Fiorcet from home medication for this. Pt c/o slight dizziness when sitting up in recliner but VSS and was able to ambulate to BR and discharged ambulatory accompanied by husband to elevator

## 2016-06-15 NOTE — Discharge Instructions (Signed)
ENTYVIO Vedolizumab injection solution What is this medicine? VEDOLIZUMAB (Ve doe LIZ you mab) is used to treat ulcerative colitis and Crohn's disease in adult patients. This medicine may be used for other purposes; ask your health care provider or pharmacist if you have questions. What should I tell my health care provider before I take this medicine? They need to know if you have any of these conditions: -diabetes -hepatitis B or history of hepatitis B infection -HIV or AIDS -immune system problems -infection or history of infections -liver disease -recently received or scheduled to receive a vaccine -scheduled to have surgery -tuberculosis, a positive skin test for tuberculosis or have recently been in close contact with someone who has tuberculosis - an unusual or allergic reaction to vedolizumab, other medicines, foods, dyes, or preservatives -pregnant or trying to get pregnant -breast-feeding How should I use this medicine? This medicine is for infusion into a vein. It is given by a health care professional in a hospital or clinic setting. A special MedGuide will be given to you by the pharmacist with each prescription and refill. Be sure to read this information carefully each time. Talk to your pediatrician regarding the use of this medicine in children. This medicine is not approved for use in children. Overdosage: If you think you have taken too much of this medicine contact a poison control center or emergency room at once. NOTE: This medicine is only for you. Do not share this medicine with others. What if I miss a dose? It is important not to miss your dose. Call your doctor or health care professional if you are unable to keep an appointment. What may interact with this medicine? -steroid medicines like prednisone or cortisone -TNF-alpha inhibitors like natalizumab, adalimumab, and infliximab -vaccines This list may not describe all possible interactions. Give your  health care provider a list of all the medicines, herbs, non-prescription drugs, or dietary supplements you use. Also tell them if you smoke, drink alcohol, or use illegal drugs. Some items may interact with your medicine. What should I watch for while using this medicine? Your condition will be monitored carefully while you are receiving this medicine. Visit your doctor for regular check ups. Tell your doctor or healthcare professional if your symptoms do not start to get better or if they get worse. Stay away from people who are sick. Call your doctor or health care professional for advice if you get a fever, chills or sore throat, or other symptoms of a cold or flu. Do not treat yourself. In some patients, this medicine may cause a serious brain infection that may cause death. If you have any problems seeing, thinking, speaking, walking, or standing, tell your doctor right away. If you cannot reach your doctor, get urgent medical care. What side effects may I notice from receiving this medicine? Side effects that you should report to your doctor or health care professional as soon as possible: -allergic reactions like skin rash, itching or hives, swelling of the face, lips, or tongue -breathing problems -changes in vision -chest pain -dark urine -depression, feelings of sadness -dizziness -general ill feeling or flu-like symptoms -irregular, missed, or painful menstrual periods -light-colored stools -loss of appetite, nausea -muscle weakness -problems with balance, talking, or walking -right upper belly pain -unusually weak or tired -yellowing of the eyes or skin Side effects that usually do not require medical attention (Report these to your doctor or health care professional if they continue or are bothersome.): -aches, pains -headache -stomach upset -  tiredness This list may not describe all possible side effects. Call your doctor for medical advice about side effects. You may report  side effects to FDA at 1-800-FDA-1088. Where should I keep my medicine? This drug is given in a hospital or clinic and will not be stored at home. NOTE: This sheet is a summary. It may not cover all possible information. If you have questions about this medicine, talk to your doctor, pharmacist, or health care provider.    2016, Elsevier/Gold Standard. (2013-05-14 12:32:57) Crohn Disease Crohn disease is a long-lasting (chronic) disease that affects your gastrointestinal (GI) tract. It often causes irritation and swelling (inflammation) in your small intestine and the beginning of your large intestine. However, it can affect any part of your GI tract. Crohn disease is part of a group of illnesses that are known as inflammatory bowel disease (IBD). Crohn disease may start slowly and get worse over time. Symptoms may come and go. They may also disappear for months or even years at a time (remission). CAUSES The exact cause of Crohn disease is not known. It may be a response that causes your body's defense system (immune system) to mistakenly attack healthy cells and tissues (autoimmune response). Your genes and your environment may also play a role. RISK FACTORS You may be at greater risk for Crohn disease if you:  Have other family members with Crohn disease or another IBD.  Use any tobacco products, including cigarettes, chewing tobacco, or electronic cigarettes.  Are in your 83s.  Have [place] European ancestry. SIGNS AND SYMPTOMS The main signs and symptoms of Crohn disease involve your GI tract. These include:  Diarrhea.  Rectal bleeding.  An urgent need to move your bowels.  The feeling that you are not finished having a bowel movement.  Abdominal pain or cramping.  Constipation. General signs and symptoms of Crohn disease may also include:  Unexplained weight loss.  Fatigue.  Fever.  Nausea.  Loss of appetite.  Joint pain  Changes in vision.  Red bumps on your  skin. DIAGNOSIS Your health care provider may suspect Crohn disease based on your symptoms and your medical history. Your health care provider will do a physical exam. You may need to see a health care provider who specializes in diseases of the digestive tract (gastroenterologist). You may also have tests to help your health care providers make a diagnosis. These may include:  Blood tests.  Stool sample tests.  Imaging tests, such as X-rays and CT scans.  Tests to examine the inside of your intestines using a long, flexible tube that has a light and a camera on the end (endoscopy or colonoscopy).  A procedure to take tissue samples from inside your bowel (biopsy) to be examined under a microscope. TREATMENT  There is no cure for Crohn disease. Treatment will focus on managing your symptoms. Crohn disease affects each person differently. Your treatment may include:  Resting your bowels. Drinking only clear liquids or getting nutrition through an IV for a period of time gives your bowels a chance to heal because they are not passing stools.  Medicines. These may be used alone or in combination (combination therapy). These may include antibiotic medicines. You may be given medicines that help to:  Reduce inflammation.  Control your immune system activity.  Fight infections.  Relieve cramps and prevent diarrhea.  Control your pain.  Surgery. You may need surgery if:  Medicines and other treatments are no longer working.  You develop complications from severe  Crohn disease.  A section of your intestine becomes so damaged that it needs to be removed. HOME CARE INSTRUCTIONS  Take medicines only as directed by your health care provider.  If you were prescribed an antibiotic medicine, finish it all even if you start to feel better.  Keep all follow-up visits as directed by your health care provider. This is important.  Talk with your health care provider about changing your diet.  This may help your symptoms. Your health care provide may recommend changes, such as:  Drinking more fluids.  Avoiding milk and other foods that contain lactose.  Eating a low-fat diet.  Avoiding high-fiber foods, such as popcorn and nuts.  Avoiding carbonated beverages, such as soda.  Eating smaller meals more often rather than eating large meals.  Keeping a food diary to identify foods that make your symptoms better or worse.  Do not use any tobacco products, including cigarettes, chewing tobacco, or electronic cigarettes. If you need help quitting, ask your health care provider.  Limit alcohol intake to no more than 1 drink per day for nonpregnant women and 2 drinks per day for men. One drink equals 12 ounces of beer, 5 ounces of wine, or 1 ounces of hard liquor.  Exercise daily or as directed by your health care provider. SEEK MEDICAL CARE IF:  You have diarrhea, abdominal cramps, and other gastrointestinal problems that are present almost all of the time.  Your symptoms do not improve with treatment.  You continue to lose weight.  You develop a rash or sores on your skin.  You develop eye problems.  You have a fever.   Your symptoms get worse.  You develop new symptoms. SEEK IMMEDIATE MEDICAL CARE IF:  You have bloody diarrhea.  You develop severe abdominal pain.  You cannot pass stools.   This information is not intended to replace advice given to you by your health care provider. Make sure you discuss any questions you have with your health care provider.   Document Released: 09/19/2005 Document Revised: 12/31/2014 Document Reviewed: 07/28/2014 Elsevier Interactive Patient Education [employer].

## 2016-06-29 ENCOUNTER — Encounter (HOSPITAL_COMMUNITY): Payer: BC Managed Care – PPO

## 2016-07-02 ENCOUNTER — Encounter (HOSPITAL_COMMUNITY): Payer: Self-pay

## 2016-07-02 ENCOUNTER — Encounter (HOSPITAL_COMMUNITY)
Admission: RE | Admit: 2016-07-02 | Discharge: 2016-07-02 | Disposition: A | Discharge status: Home / Self Care | Payer: BC Managed Care – PPO | Source: Ambulatory Visit | Attending: Gastroenterology | Admitting: Gastroenterology

## 2016-07-02 ENCOUNTER — Other Ambulatory Visit: Payer: Self-pay | Admitting: *Deleted

## 2016-07-02 VITALS — BP 120/75 | HR 80 | Temp 98.2°F | Resp 16 | Ht 64.0 in | Wt 147.6 lb

## 2016-07-02 DIAGNOSIS — D509 Iron deficiency anemia, unspecified: Secondary | ICD-10-CM | POA: Insufficient documentation

## 2016-07-02 DIAGNOSIS — K50119 Crohn's disease of large intestine with unspecified complications: Secondary | ICD-10-CM

## 2016-07-02 MED ORDER — VEDOLIZUMAB 300 MG IV SOLR
300.0000 mg | Freq: Once | INTRAVENOUS | Status: AC
  Administered 2016-07-02: 300 mg via INTRAVENOUS
  Filled 2016-07-02: qty 5

## 2016-07-02 MED ORDER — SODIUM CHLORIDE 0.9 % IV SOLN
Freq: Once | INTRAVENOUS | Status: AC
  Administered 2016-07-02: 14:00:00 via INTRAVENOUS

## 2016-07-02 NOTE — Progress Notes (Signed)
Patient completed second Entyvio infusion. Tolerated well. Aware to call Md for any problems or questions.

## 2016-07-02 NOTE — Telephone Encounter (Signed)
Last office visit 03/02/2016.  Last refilled 06/05/2016 for #30 with no refills.  Ok to refill?

## 2016-07-03 ENCOUNTER — Other Ambulatory Visit: Payer: Self-pay | Admitting: *Deleted

## 2016-07-03 MED ORDER — ALPRAZOLAM 0.25 MG PO TABS: ORAL_TABLET | ORAL | Status: DC

## 2016-07-03 MED ORDER — ZOLPIDEM TARTRATE 10 MG PO TABS: 10.0000 mg | ORAL_TABLET | Freq: Every day | ORAL | Status: DC

## 2016-07-03 NOTE — Telephone Encounter (Signed)
Last office visit 03/02/2016.  Last refilled 06/05/2016 for #30 with no refill?  Ok to refill?

## 2016-07-03 NOTE — Telephone Encounter (Signed)
Alprazolam called into [facility].

## 2016-07-03 NOTE — Telephone Encounter (Signed)
Zolpidem called into [facility].

## 2016-07-27 ENCOUNTER — Encounter (HOSPITAL_COMMUNITY): Payer: BC Managed Care – PPO

## 2016-07-30 ENCOUNTER — Encounter (HOSPITAL_COMMUNITY)
Admission: RE | Admit: 2016-07-30 | Discharge: 2016-07-30 | Disposition: A | Discharge status: Home / Self Care | Payer: BC Managed Care – PPO | Source: Ambulatory Visit | Attending: Gastroenterology | Admitting: Gastroenterology

## 2016-07-30 DIAGNOSIS — D509 Iron deficiency anemia, unspecified: Secondary | ICD-10-CM | POA: Insufficient documentation

## 2016-07-30 DIAGNOSIS — K50119 Crohn's disease of large intestine with unspecified complications: Secondary | ICD-10-CM

## 2016-07-30 MED ORDER — VEDOLIZUMAB 300 MG IV SOLR
300.0000 mg | Freq: Once | INTRAVENOUS | Status: AC
  Administered 2016-07-30: 300 mg via INTRAVENOUS
  Filled 2016-07-30: qty 5

## 2016-07-30 MED ORDER — SODIUM CHLORIDE 0.9 % IV SOLN
Freq: Once | INTRAVENOUS | Status: AC
  Administered 2016-07-30: 10:00:00 via INTRAVENOUS

## 2016-08-03 ENCOUNTER — Other Ambulatory Visit: Payer: Self-pay | Admitting: Family Medicine

## 2016-08-03 ENCOUNTER — Other Ambulatory Visit: Payer: Self-pay

## 2016-08-03 ENCOUNTER — Other Ambulatory Visit: Payer: Self-pay | Admitting: Gastroenterology

## 2016-08-03 MED ORDER — ZOLPIDEM TARTRATE 10 MG PO TABS: 10.0000 mg | ORAL_TABLET | Freq: Every day | ORAL | 0 refills | Status: DC

## 2016-08-03 MED ORDER — ALPRAZOLAM 0.25 MG PO TABS: ORAL_TABLET | ORAL | 0 refills | Status: DC

## 2016-08-03 NOTE — Telephone Encounter (Addendum)
Both meds Last filled 07-03-16 #30 Last office visit 03/02/2016 No Future OV

## 2016-08-03 NOTE — Telephone Encounter (Signed)
Left refills on voice mail at pharmacy

## 2016-09-05 ENCOUNTER — Other Ambulatory Visit: Payer: Self-pay | Admitting: *Deleted

## 2016-09-05 NOTE — Telephone Encounter (Signed)
Last office visit 03/02/2016.  Last refilled 08/03/2016 for #30 with no refills.

## 2016-09-06 ENCOUNTER — Encounter: Payer: Self-pay | Admitting: Internal Medicine

## 2016-09-06 MED ORDER — ZOLPIDEM TARTRATE 10 MG PO TABS: 10.0000 mg | ORAL_TABLET | Freq: Every day | ORAL | 0 refills | Status: DC

## 2016-09-06 NOTE — Telephone Encounter (Signed)
Zolpidem called into [facility].

## 2016-09-10 ENCOUNTER — Encounter: Payer: Self-pay | Admitting: Internal Medicine

## 2016-09-10 ENCOUNTER — Telehealth: Payer: Self-pay

## 2016-09-10 NOTE — Telephone Encounter (Signed)
Unable to reach pt by phone.

## 2016-09-10 NOTE — Telephone Encounter (Signed)
PLEASE NOTE: All timestamps contained within this report are represented as [place] Standard Time. CONFIDENTIALTY NOTICE: This fax transmission is intended only for the addressee. It contains information that is legally privileged, confidential or otherwise protected from use or disclosure. If you are not the intended recipient, you are strictly prohibited from reviewing, disclosing, copying using or disseminating any of this information or taking any action in reliance on or regarding this information. If you have received this fax in error, please notify us immediately by telephone so that we can arrange for its return to [name]. Phone: [phone], Toll-Free: [phone], Fax: [phone] Page: 1 of 2 Call Id: [id number] [place] Patient Name: [name] Gender: Female DOB: Jul 23, 1976 Age: 40 Y 35 M Return Phone Number: [phone] (Primary), [phone] (Secondary) Address: City/State/Zip: [place] Night - Client Client Site [place] Physician Eliezer Lofts - MD Contact Type Call Who Is Calling Patient / Member / Family / Caregiver Call Type Triage / Clinical Relationship To Patient Self Return Phone Number [phone] (Primary) Chief Complaint Urination Pain Reason for Call Symptomatic / Request for [place] states having back pain, pain when urinating PreDisposition Home Care Translation No Nurse Assessment Nurse: Julien Girt, RN, Almyra Free Date/Time Eilene Ghazi Time): 09/08/2016 11:00:43 AM Confirm and document reason for call. If symptomatic, describe symptoms. You must click the next button to save text entered. ---Caller states she is having lower back pain, blood in her urine and very painful urination. Has the patient traveled out of the country within the last 30 days? ---Not  Applicable Does the patient have any new or worsening symptoms? ---Yes Will a triage be completed? ---Yes Related visit to physician within the last 2 weeks? ---No Does the PT have any chronic conditions? (i.e. diabetes, asthma, etc.) ---Yes List chronic conditions. ---Hx UTI, kidney stones, Crohn's disease Allergic : Phenergan, Macrobid, Compazine and Macrobid Is the patient pregnant or possibly pregnant? (Ask all females between the ages of 32-55) ---No Is this a behavioral health or substance abuse call? ---No Guidelines Guideline Title Affirmed Question Affirmed Notes Nurse Date/Time (Eastern Time) Urination Pain - Female [1] SEVERE pain with urination (e.g., excruciating) AND [2] not improved after 2 hours of pain medicine and Sitz bath Julien Girt, RN, Almyra Free 09/08/2016 11:03:03 AM PLEASE NOTE: All timestamps contained within this report are represented as [place] Standard Time. CONFIDENTIALTY NOTICE: This fax transmission is intended only for the addressee. It contains information that is legally privileged, confidential or otherwise protected from use or disclosure. If you are not the intended recipient, you are strictly prohibited from reviewing, disclosing, copying using or disseminating any of this information or taking any action in reliance on or regarding this information. If you have received this fax in error, please notify us immediately by telephone so that we can arrange for its return to [name]. Phone: [phone], Toll-Free: [phone], Fax: [phone] Page: 2 of 2 Call Id: [id number] [place]. Time Eilene Ghazi Time) Disposition Final User 09/08/2016 11:09:17 AM Send To RN Personal Julien Girt, RN, Almyra Free 09/08/2016 11:19:08 AM Called On-Call Provider Julien Girt, RN, Almyra Free Reason: [facility] to reach Dr. Caryl Bis, spoke to [place]. States he will not all in an rx and the pt will have to be seen at an UC as they are full. 09/08/2016 11:22:17 AM Call Completed Julien Girt RN,  Almyra Free 09/08/2016 11:06:35 AM See Physician within 4 Hours (or  PCP triage) Yes Julien Girt, RN, Dagoberto Reef Understands: Yes Disagree/Comply: Disagree Disagree/Comply Reason: Disagree with instructions Care Advice Given Per Guideline SEE PHYSICIAN WITHIN 4 HOURS (or PCP triage): CARE ADVICE given per Urination Pain - Female (Adult) guideline. Comments User: Eloisa Northern, RN Date/Time Eilene Ghazi Time): 09/08/2016 11:06:28 AM Caller states she is leaving town shortly and is requesting I page the on call for abx. User: Eloisa Northern, RN Date/Time Eilene Ghazi Time): 09/08/2016 11:22:06 AM Spoke to pt., information and instructions provided. States she will have to wait until Monday to be seen. Strongly encouraged she go to an UC so that her sx do not worsen. She verbalized understanding and will cb as needed. Referrals GO TO FACILITY OTHER - SPECIFY

## 2016-09-11 ENCOUNTER — Ambulatory Visit (INDEPENDENT_AMBULATORY_CARE_PROVIDER_SITE_OTHER): Payer: BC Managed Care – PPO | Admitting: Internal Medicine

## 2016-09-11 ENCOUNTER — Encounter: Payer: Self-pay | Admitting: Internal Medicine

## 2016-09-11 VITALS — BP 106/72 | HR 92 | Ht 64.0 in | Wt 147.6 lb

## 2016-09-11 DIAGNOSIS — R002 Palpitations: Secondary | ICD-10-CM | POA: Diagnosis not present

## 2016-09-11 MED ORDER — FLUDROCORTISONE ACETATE 0.1 MG PO TABS: 0.1000 mg | ORAL_TABLET | Freq: Two times a day (BID) | ORAL | 3 refills | Status: DC

## 2016-09-11 NOTE — Telephone Encounter (Signed)
Call pt to determine if needs to be seen.

## 2016-09-11 NOTE — Patient Instructions (Signed)
Medication Instructions:  Your physician has recommended you make the following change in your medication:  1) START Florinef 0.1 mg twice a day at 7am and 2pm  Labwork: BMP day of stress test  Testing/Procedures:Your physician has requested that you have an exercise tolerance test. For further information please visit [email or web address]. Please also follow instruction sheet, as given.     Follow-Up: Follow-up to be determined   Any Other Special Instructions Will Be Listed Below (If Applicable).     If you need a refill on your cardiac medications before your next appointment, please call your pharmacy.

## 2016-09-11 NOTE — Progress Notes (Signed)
HPI [name] returns today for followup of autonomic dysfunction. She is a very pleasant 40 year old woman with a history of autonomic dysfunction, who is also known to have hyperemesis gravidarum. She has done well since her baby was born and her emesis has resolved. She has developed symptoms of near syncope which are occurring very often, almost daily and improved by lying down. The patient has not had frank syncope. She has also had problems with chest pain which is related to activity and associated with radiation to the neck. She has not undergone any additional evaluation. Her Crohn disease is quiet at present. She apparently developed serum sickness on Remicade. Previously she had been on florinef.   Allergies  Allergen Reactions  . Biaxin [Clarithromycin] Other (See Comments)    Other reaction(s): Bleeding Cant take due to Chrons disease  . Codeine Nausea And Vomiting and Other (See Comments)    Other reaction(s): Bleeding Dilaudid and demerol OK  . Compazine [Prochlorperazine Edisylate] Anaphylaxis  . Reglan [Metoclopramide] Anaphylaxis and Other (See Comments)    Other reaction(s): GI Upset (intolerance) Intensifies her Chron's  . Chlorhexidine Gluconate     Other reaction(s): Redness  . Other Rash    GuardIVa - PICC line antimicrobial dressing  Redness Resultant infection with PICC line x2 with irritation from adhesive "chloraprep"  . Oxycodone Nausea And Vomiting  . Sulfamethoxazole Rash and Other (See Comments)    Internal bleeding and kidney infection  . Sulfamethoxazole-Trimethoprim Other (See Comments)    "muscle twitches" Muscle spasms  . Chlorhexidine Other (See Comments)    Blisters after using a couple of times.   . Remicade [Infliximab] Other (See Comments)    Serum Sickness  Couldn't walk, open mouth, pain,   . Hydrocodone Nausea And Vomiting  . Macrobid [Nitrofurantoin] Nausea And Vomiting    Other reaction(s): GI Upset (intolerance)  . Phenergan  [Promethazine Hcl] Other (See Comments)    Other reaction(s): Delusions (intolerance) Muscular seizures "muscle twitches"  . Promethazine Rash and Other (See Comments)    Muscular seizures  . Sulfa Antibiotics Hives and Other (See Comments)    Hematemesis; documented while a young child     Current Outpatient Prescriptions  Medication Sig Dispense Refill  . acetaminophen (TYLENOL) 500 MG tablet Take 1,000 mg by mouth every 6 (six) hours as needed for headache.    . albuterol (PROVENTIL HFA;VENTOLIN HFA) 108 (90 BASE) MCG/ACT inhaler Inhale 2 puffs into the lungs every 6 (six) hours as needed for wheezing or shortness of breath. 1 Inhaler 2  . ALPRAZolam (XANAX) 0.25 MG tablet Take 0.5 mg by mouth daily as needed for anxiety (Take as directed).    Marland Kitchen azaTHIOprine (IMURAN) 50 MG tablet TAKE 3 TABLETS BY MOUTH EVERY DAY AS DIRECTED. 90 tablet 1  . budesonide (ENTOCORT EC) 3 MG 24 hr capsule 3 tabs daily for 4 weeks, then 2 tabs daily for one week, then one tab daily for one week 105 capsule 0  . butalbital-acetaminophen-caffeine (FIORICET, ESGIC) 50-325-40 MG tablet Take 1-2 tablets by mouth every 6 (six) hours as needed for headache or migraine. 30 tablet 0  . Cetirizine HCl (ZYRTEC ALLERGY PO) Take 1 tablet by mouth daily.     . cyanocobalamin (,VITAMIN B-12,) 1000 MCG/ML injection Inject 1,000 mcg into the muscle every 21 ( twenty-one) days.    . cyclobenzaprine (FLEXERIL) 10 MG tablet Take 0.5-1 tablets (5-10 mg total) by mouth 3 (three) times daily as needed for muscle spasms.  30 tablet 0  . dexlansoprazole (DEXILANT) 60 MG capsule Take 1 capsule (60 mg total) by mouth daily. 30 capsule 5  . Prenatal Vit-Fe Fumarate-FA (PRENATAL PO) Take 2 tablets by mouth every evening. Prenatal gummy vitamin.    . ranitidine (ZANTAC) 150 MG tablet Take 1 tablet (150 mg total) by mouth every morning. 30 tablet 3  . sertraline (ZOLOFT) 100 MG tablet TAKE 1 TABLET BY MOUTH DAILY 90 tablet 1  . Vedolizumab  (ENTYVIO IV) Inject into the vein as directed.    . Vitamin D, Ergocalciferol, (DRISDOL) 50000 units CAPS capsule Take one po weekly x 6 weeks 4 capsule 1  . zolpidem (AMBIEN) 10 MG tablet Take 1 tablet (10 mg total) by mouth at bedtime. 30 tablet 0  . fludrocortisone (FLORINEF) 0.1 MG tablet Take 1 tablet (0.1 mg total) by mouth 2 (two) times daily. Take at 7am and 2pm 60 tablet 3   No current facility-administered medications for this visit.      Past Medical History:  Diagnosis Date  . Anginal pain ([place]) only on 09/22/2014  . Anxiety   . Asthma   . Chest pain    a. s/p intermediate risk nuclear stress test on 09/23/14 and normal LHC on 09/24/14   . Chronic nausea   . Crohn disease ([place]) 2000  . Dysautonomia    recurrent syncope s/p ILR by Dr Doreatha Lew  . Endometriosis   . GERD (gastroesophageal reflux disease)   . H/O hiatal hernia   . Migraine   . Palpitations   . Pulmonary embolism affecting pregnancy   . Vertigo     ROS:   All systems reviewed and negative except as noted in the HPI.   Past Surgical History:  Procedure Laterality Date  . APPENDECTOMY  ~ 1992  . CESAREAN SECTION  2011   emergency  CS due to placental abruption  . CESAREAN SECTION  2014   "preeclampsia"  . COLON SURGERY    . CYSTOSCOPY W/ STONE MANIPULATION  X 2  . ENDOSCOPIC RELEASE TRANSVERSE CARPAL LIGAMENT OF HAND Right 12/2010  . ILEOCECETOMY  2002 X 2  . KNEE ARTHROSCOPY Bilateral 1988-1990  . LAPAROSCOPY FOR ECTOPIC PREGNANCY  11/2012  . LEFT HEART CATHETERIZATION WITH CORONARY ANGIOGRAM N/A 09/24/2014   Procedure: LEFT HEART CATHETERIZATION WITH CORONARY ANGIOGRAM;  Surgeon: Leonie Man, MD;  Location: [facility] CATH LAB;  Service: Cardiovascular;  Laterality: N/A;  . LOOP RECORDER IMPLANT  11/2009   by DR Doreatha Lew  . NASAL SINUS SURGERY  ~ 2007  . PARTIAL COLECTOMY  2002   partial removed due to crohns  . TONSILLECTOMY  ~ 1996  . US ECHOCARDIOGRAPHY  11/11/2009   EF 55-60%     Family  History  Problem Relation Age of Onset  . Hypertension Mother   . Hyperlipidemia Mother   . Arthritis Mother   . Diabetes Father   . Coronary artery disease Father   . Heart disease Father 78    MI age 43  . Hyperlipidemia Brother   . Hypertension Brother   . Colon cancer Paternal Grandmother      Social History   Social History  . Marital status: Married    Spouse name: Jonni Sanger  . Number of children: 2  . Years of education: college   Occupational History  .  Other    Airline pilot  .  Marty History Main Topics  . Smoking status: Never Smoker  . Smokeless  tobacco: Never Used  . Alcohol use Yes  . Drug use: No  . Sexual activity: Yes    Birth control/ protection: Surgical   Other Topics Concern  . Not on file   Social History Narrative   Patient lives at home with her husband Jonni Sanger). Patient works full time for [employer].    Education [employer].   Right handed.   Caffeine one cup of coffee daily.     BP 106/72   Pulse 92   Ht 5' 4"  (1.626 m)   Wt 147 lb 9.6 oz (67 kg)   BMI 25.34 kg/m   Physical Exam:  Stable appearing 40 year old woman, NAD HEENT: Unremarkable Neck:  67cm JVD, no thyromegally Back:  No CVA tenderness Lungs:  Clear with no wheezes, rales, or rhonchi. Indwelling PICC line in left arm. HEART:  Regular rate rhythm, no murmurs, no rubs, no clicks Abd:  soft, positive bowel sounds, no organomegally, no rebound, no guarding Ext:  2 plus pulses, no edema, no cyanosis, no clubbing Skin:  No rashes no nodules Neuro:  CN II through XII intact, motor grossly intact   Assess/Plan: 1. Near syncope in the setting of known autonomic dysfunction - I have recommended she restart Florinef 0.1 mg twice daily 2. Chest pain - will schedule regular stress test 3. H/o pulmonary embolism - she has had none of the same symptoms she had with her PE. Will not plan any additional eval at this time. 4. Crohn disease  - her symptoms have been fairly well controlled on with Entyvio.  Mikle Bosworth.D.

## 2016-09-11 NOTE — Telephone Encounter (Signed)
Left message for [name] to return my call with an update on how she is doing and to see if we need to get her in our office for an appointment.

## 2016-09-12 NOTE — Telephone Encounter (Signed)
Spoke with [place].  She states she called her OB-GYN, after not being able to get in [facility], and he had a standing order for Cipro.  So she is on Cipro and states after taking it for two days, she felt relief.

## 2016-09-13 ENCOUNTER — Telehealth: Payer: Self-pay | Admitting: Family Medicine

## 2016-09-13 NOTE — Telephone Encounter (Signed)
Pt returned your call.  

## 2016-09-13 NOTE — Telephone Encounter (Signed)
I did not call her. Butch Penny?

## 2016-09-13 NOTE — Telephone Encounter (Signed)
Spoke to [place].  She states she thought the Cipro was helping but now that she has completed the course of antibiotics,  her symptoms are back. She is running a fever and states that  her back is killing her.  She is also having vertigo.  Appointment scheduled with Dr. Diona Browner tomorrow 09/14/2016 at 4:00 pm.

## 2016-09-14 ENCOUNTER — Telehealth: Payer: Self-pay | Admitting: Family Medicine

## 2016-09-14 ENCOUNTER — Ambulatory Visit (INDEPENDENT_AMBULATORY_CARE_PROVIDER_SITE_OTHER): Payer: BC Managed Care – PPO | Admitting: Family Medicine

## 2016-09-14 ENCOUNTER — Ambulatory Visit (INDEPENDENT_AMBULATORY_CARE_PROVIDER_SITE_OTHER)
Admission: RE | Admit: 2016-09-14 | Discharge: 2016-09-14 | Disposition: A | Discharge status: Home / Self Care | Payer: BC Managed Care – PPO | Source: Ambulatory Visit | Attending: Family Medicine | Admitting: Family Medicine

## 2016-09-14 VITALS — BP 106/70 | HR 78 | Temp 98.5°F | Ht 64.0 in | Wt 149.0 lb

## 2016-09-14 DIAGNOSIS — R42 Dizziness and giddiness: Secondary | ICD-10-CM | POA: Diagnosis not present

## 2016-09-14 DIAGNOSIS — M549 Dorsalgia, unspecified: Secondary | ICD-10-CM | POA: Diagnosis not present

## 2016-09-14 DIAGNOSIS — N3000 Acute cystitis without hematuria: Secondary | ICD-10-CM

## 2016-09-14 LAB — POC URINALSYSI DIPSTICK (AUTOMATED)
Blood, UA: NEGATIVE
Glucose, UA: NEGATIVE
KETONES UA: NEGATIVE
LEUKOCYTES UA: NEGATIVE
NITRITE UA: NEGATIVE
Spec Grav, UA: >=1.03
Urobilinogen, UA: 0.2
pH, UA: 6

## 2016-09-14 MED ORDER — HYDROMORPHONE HCL 2 MG PO TABS: ORAL_TABLET | ORAL | 0 refills | Status: AC

## 2016-09-14 MED ORDER — ONDANSETRON HCL 4 MG PO TABS: 4.0000 mg | ORAL_TABLET | Freq: Three times a day (TID) | ORAL | 0 refills | Status: DC | PRN

## 2016-09-14 MED ORDER — CIPROFLOXACIN HCL 250 MG PO TABS: 250.0000 mg | ORAL_TABLET | Freq: Two times a day (BID) | ORAL | 0 refills | Status: DC

## 2016-09-14 NOTE — Progress Notes (Signed)
Pre visit review using our clinic review tool, if applicable. No additional management support is needed unless otherwise documented below in the visit note. 

## 2016-09-14 NOTE — Telephone Encounter (Signed)
Xray noted, d/w pt.  Communicated with PCP.  rx done for zofran and dilaudid.  She can take dilaudid w/o ADE but can't tolerate other opiates. Will defer to PCP for long term mgmt and potentially moving up spine clinic eval.  App help of all involved.  Routed to PCP as FYI.
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
## 2016-09-14 NOTE — Patient Instructions (Addendum)
Start flonase 2 sprays per nostril daily. Try xyzal instead of zyrtec.  Start hoe desensitization treatment for possible vertigo. We will call with X-ray results and urine culture result.  Complete course of cipro for possible partially treated UTI.  Go to ER if fever on antibiotics.

## 2016-09-16 LAB — URINE CULTURE: Organism ID, Bacteria: NO GROWTH

## 2016-09-17 ENCOUNTER — Telehealth: Payer: Self-pay | Admitting: Internal Medicine

## 2016-09-17 ENCOUNTER — Encounter: Payer: Self-pay | Admitting: Family Medicine

## 2016-09-17 ENCOUNTER — Telehealth: Payer: Self-pay | Admitting: Family Medicine

## 2016-09-17 NOTE — Telephone Encounter (Signed)
New message  Pt call requesting to speak with RN. Pt states she is to have a stress test but wants to know if she would still be able to complete this appt after been diagnosed with a fractured T4 vertebra. Please call back to discuss.

## 2016-09-17 NOTE — Telephone Encounter (Signed)
Patient called to find out if Dr.Bedsole will be moving up [facility].  Patient saw Dr.Duncan on Friday.

## 2016-09-18 NOTE — Telephone Encounter (Signed)
Discussed with Dr Lovena Le and he said she can hold off on the stress test for 9-12 weeks.  She will call us back to reschedule before the end of the year

## 2016-09-20 ENCOUNTER — Other Ambulatory Visit: Payer: Self-pay | Admitting: *Deleted

## 2016-09-20 ENCOUNTER — Other Ambulatory Visit: Payer: Self-pay

## 2016-09-20 DIAGNOSIS — K5010 Crohn's disease of large intestine without complications: Secondary | ICD-10-CM

## 2016-09-20 MED ORDER — ALPRAZOLAM 0.25 MG PO TABS: 0.5000 mg | ORAL_TABLET | Freq: Every day | ORAL | 1 refills | Status: DC | PRN

## 2016-09-20 NOTE — Telephone Encounter (Signed)
Last office visit 09/14/2016.  Last refill 08/03/2016 for #30 with no refills. Ok to refill?

## 2016-09-20 NOTE — Telephone Encounter (Signed)
Alprazolam called into [place].

## 2016-09-21 ENCOUNTER — Other Ambulatory Visit: Payer: BC Managed Care – PPO

## 2016-09-21 ENCOUNTER — Encounter (HOSPITAL_COMMUNITY): Payer: BC Managed Care – PPO

## 2016-09-24 ENCOUNTER — Encounter (HOSPITAL_COMMUNITY): Payer: BC Managed Care – PPO

## 2016-09-26 ENCOUNTER — Encounter (HOSPITAL_COMMUNITY)
Admission: RE | Admit: 2016-09-26 | Discharge: 2016-09-26 | Disposition: A | Discharge status: Home / Self Care | Payer: BC Managed Care – PPO | Source: Ambulatory Visit | Attending: Gastroenterology | Admitting: Gastroenterology

## 2016-09-26 ENCOUNTER — Encounter (HOSPITAL_COMMUNITY): Payer: Self-pay

## 2016-09-26 DIAGNOSIS — K5010 Crohn's disease of large intestine without complications: Secondary | ICD-10-CM

## 2016-09-26 DIAGNOSIS — D509 Iron deficiency anemia, unspecified: Secondary | ICD-10-CM | POA: Diagnosis not present

## 2016-09-26 MED ORDER — VEDOLIZUMAB 300 MG IV SOLR
300.0000 mg | Freq: Once | INTRAVENOUS | Status: AC
  Administered 2016-09-26: 300 mg via INTRAVENOUS
  Filled 2016-09-26: qty 5

## 2016-09-26 MED ORDER — SODIUM CHLORIDE 0.9 % IV SOLN
Freq: Once | INTRAVENOUS | Status: AC
  Administered 2016-09-26: 14:00:00 via INTRAVENOUS

## 2016-09-26 NOTE — Telephone Encounter (Signed)
Called spoke to Faith at [facility] short stay,  relayed Dr. Doyne Keel message that it's okay to proceed with Entyzio.    Manus Gunning, MD  Doristine Counter, RN        Yes she can have it. Should not affect sinus infection. Thanks   Previous Messages    ----- Message -----  From: Doristine Counter, RN  Sent: 09/26/2016  1:36 PM  To: Manus Gunning, MD   [facility] short stay just called and is there to have Entyzio, they were questioning if it was okay since she just finished a round of Cipro for a sinus infection. Last dose was Sunday.

## 2016-09-26 NOTE — BH Specialist Note (Signed)
Pt finished 10 day course of antibiotics on Oct.1 for a sinus infection.  Called Dr. Doyne Keel office to inquire if okay to receive Entyvio today.  Almyra Free called back and states Dr. Havery Moros states it is okay to receive Entyvio today.

## 2016-09-26 NOTE — Discharge Instructions (Signed)
Entyvio Vedolizumab injection solution What is this medicine? VEDOLIZUMAB (Ve doe LIZ you mab) is used to treat ulcerative colitis and Crohn's disease in adult patients. This medicine may be used for other purposes; ask your health care provider or pharmacist if you have questions. What should I tell my health care provider before I take this medicine? They need to know if you have any of these conditions: -diabetes -hepatitis B or history of hepatitis B infection -HIV or AIDS -immune system problems -infection or history of infections -liver disease -recently received or scheduled to receive a vaccine -scheduled to have surgery -tuberculosis, a positive skin test for tuberculosis or have recently been in close contact with someone who has tuberculosis - an unusual or allergic reaction to vedolizumab, other medicines, foods, dyes, or preservatives -pregnant or trying to get pregnant -breast-feeding How should I use this medicine? This medicine is for infusion into a vein. It is given by a health care professional in a hospital or clinic setting. A special MedGuide will be given to you by the pharmacist with each prescription and refill. Be sure to read this information carefully each time. Talk to your pediatrician regarding the use of this medicine in children. This medicine is not approved for use in children. Overdosage: If you think you have taken too much of this medicine contact a poison control center or emergency room at once. NOTE: This medicine is only for you. Do not share this medicine with others. What if I miss a dose? It is important not to miss your dose. Call your doctor or health care professional if you are unable to keep an appointment. What may interact with this medicine? -steroid medicines like prednisone or cortisone -TNF-alpha inhibitors like natalizumab, adalimumab, and infliximab -vaccines This list may not describe all possible interactions. Give your  health care provider a list of all the medicines, herbs, non-prescription drugs, or dietary supplements you use. Also tell them if you smoke, drink alcohol, or use illegal drugs. Some items may interact with your medicine. What should I watch for while using this medicine? Your condition will be monitored carefully while you are receiving this medicine. Visit your doctor for regular check ups. Tell your doctor or healthcare professional if your symptoms do not start to get better or if they get worse. Stay away from people who are sick. Call your doctor or health care professional for advice if you get a fever, chills or sore throat, or other symptoms of a cold or flu. Do not treat yourself. In some patients, this medicine may cause a serious brain infection that may cause death. If you have any problems seeing, thinking, speaking, walking, or standing, tell your doctor right away. If you cannot reach your doctor, get urgent medical care. What side effects may I notice from receiving this medicine? Side effects that you should report to your doctor or health care professional as soon as possible: -allergic reactions like skin rash, itching or hives, swelling of the face, lips, or tongue -breathing problems -changes in vision -chest pain -dark urine -depression, feelings of sadness -dizziness -general ill feeling or flu-like symptoms -irregular, missed, or painful menstrual periods -light-colored stools -loss of appetite, nausea -muscle weakness -problems with balance, talking, or walking -right upper belly pain -unusually weak or tired -yellowing of the eyes or skin Side effects that usually do not require medical attention (Report these to your doctor or health care professional if they continue or are bothersome.): -aches, pains -headache -stomach upset -  tiredness This list may not describe all possible side effects. Call your doctor for medical advice about side effects. You may report  side effects to FDA at 1-800-FDA-1088. Where should I keep my medicine? This drug is given in a hospital or clinic and will not be stored at home. NOTE: This sheet is a summary. It may not cover all possible information. If you have questions about this medicine, talk to your doctor, pharmacist, or health care provider.    2016, Elsevier/Gold Standard. (2013-05-14 12:32:57)

## 2016-09-26 NOTE — Progress Notes (Signed)
Patient just completed 10 day dose of antibiotics for sinus infection. Last dose 10/1. Called MD to inform and verify patient may receive Entyvio today. Awaiting return call.

## 2016-10-05 ENCOUNTER — Other Ambulatory Visit: Payer: Self-pay | Admitting: Orthopaedic Surgery

## 2016-10-05 DIAGNOSIS — M545 Low back pain: Secondary | ICD-10-CM

## 2016-10-07 ENCOUNTER — Other Ambulatory Visit: Payer: Self-pay | Admitting: Gastroenterology

## 2016-10-08 ENCOUNTER — Other Ambulatory Visit: Payer: Self-pay | Admitting: *Deleted

## 2016-10-08 ENCOUNTER — Ambulatory Visit
Admission: RE | Admit: 2016-10-08 | Discharge: 2016-10-08 | Disposition: A | Discharge status: Home / Self Care | Payer: BC Managed Care – PPO | Source: Ambulatory Visit | Attending: Orthopaedic Surgery | Admitting: Orthopaedic Surgery

## 2016-10-08 DIAGNOSIS — M545 Low back pain: Secondary | ICD-10-CM

## 2016-10-08 NOTE — Telephone Encounter (Signed)
Last office visit 09/14/2016.  Last refilled 09/06/2016 for #30 with no refills.

## 2016-10-09 ENCOUNTER — Other Ambulatory Visit: Payer: Self-pay

## 2016-10-09 DIAGNOSIS — K50119 Crohn's disease of large intestine with unspecified complications: Secondary | ICD-10-CM

## 2016-10-09 MED ORDER — ZOLPIDEM TARTRATE 10 MG PO TABS: 10.0000 mg | ORAL_TABLET | Freq: Every day | ORAL | 0 refills | Status: DC

## 2016-10-09 NOTE — Telephone Encounter (Signed)
Per Dr Sherian Maroon to refill. Pt needs a CBC and CMP. Pt contacted, notified and aware to have labs.

## 2016-10-09 NOTE — Telephone Encounter (Signed)
Pt last seen in May 2017. No current follow up. Please advise on refills

## 2016-10-09 NOTE — Telephone Encounter (Signed)
Zolpidem called into [place].

## 2016-10-12 DIAGNOSIS — M549 Dorsalgia, unspecified: Secondary | ICD-10-CM | POA: Insufficient documentation

## 2016-10-12 DIAGNOSIS — N390 Urinary tract infection, site not specified: Secondary | ICD-10-CM | POA: Insufficient documentation
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
## 2016-10-12 NOTE — Assessment & Plan Note (Signed)
Start flonase 2 sprays per nostril daily. Try xyzal instead of zyrtec.  Start home desensitization treatment for possible vertigo.

## 2016-10-12 NOTE — Assessment & Plan Note (Signed)
Complete course of cipro for possible partially treated UTI.  Go to ER if fever on antibiotics.

## 2016-10-28 ENCOUNTER — Telehealth: Payer: BC Managed Care – PPO | Admitting: Family

## 2016-10-28 DIAGNOSIS — J028 Acute pharyngitis due to other specified organisms: Secondary | ICD-10-CM

## 2016-10-28 DIAGNOSIS — B9689 Other specified bacterial agents as the cause of diseases classified elsewhere: Secondary | ICD-10-CM

## 2016-10-28 MED ORDER — DOXYCYCLINE HYCLATE 100 MG PO TABS: 100.0000 mg | ORAL_TABLET | Freq: Two times a day (BID) | ORAL | 0 refills | Status: DC

## 2016-10-28 MED ORDER — BENZONATATE 100 MG PO CAPS: 100.0000 mg | ORAL_CAPSULE | Freq: Three times a day (TID) | ORAL | 0 refills | Status: DC | PRN

## 2016-10-28 NOTE — Progress Notes (Signed)
We are sorry that you are not feeling well.  Here is how we plan to help!  Based on what you have shared with me it looks like you have upper respiratory tract inflammation that has resulted in a significant cough.  Inflammation and infection in the upper respiratory tract is commonly called bronchitis and has four common causes:  Allergies, Viral Infections, Acid Reflux and Bacterial Infections.  Allergies, viruses and acid reflux are treated by controlling symptoms or eliminating the cause. An example might be a cough caused by taking certain blood pressure medications. You stop the cough by changing the medication. Another example might be a cough caused by acid reflux. Controlling the reflux helps control the cough.  Based on your presentation I believe you most likely have A cough due to bacteria.  When patients have a fever and a productive cough with a change in color or increased sputum production, we are concerned about bacterial bronchitis.  If left untreated it can progress to pneumonia.  If your symptoms do not improve with your treatment plan it is important that you contact your provider.   I hve prescribed Doxycycline 100 mg twice a day for 7 days     In addition you may use A non-prescription cough medication called Mucinex DM: take 2 tablets every 12 hours. and A prescription cough medication called Tessalon Perles 175m. You may take 1-2 capsules every 8 hours as needed for your cough.  USE OF BRONCHODILATOR ("RESCUE") INHALERS: There is a risk from using your bronchodilator too frequently.  The risk is that over-reliance on a medication which only relaxes the muscles surrounding the breathing tubes can reduce the effectiveness of medications prescribed to reduce swelling and congestion of the tubes themselves.  Although you feel brief relief from the bronchodilator inhaler, your asthma may actually be worsening with the tubes becoming more swollen and filled with mucus.  This can delay  other crucial treatments, such as oral steroid medications. If you need to use a bronchodilator inhaler daily, several times per day, you should discuss this with your provider.  There are probably better treatments that could be used to keep your asthma under control.     HOME CARE . Only take medications as instructed by your medical team. . Complete the entire course of an antibiotic. . Drink plenty of fluids and get plenty of rest. . Avoid close contacts especially the very young and the elderly . Cover your mouth if you cough or cough into your sleeve. . Always remember to wash your hands . A steam or ultrasonic humidifier can help congestion.   GET HELP RIGHT AWAY IF: . You develop worsening fever. . You become short of breath . You cough up blood. . Your symptoms persist after you have completed your treatment plan MAKE SURE YOU   Understand these instructions.  Will watch your condition.  Will get help right away if you are not doing well or get worse.  Your e-visit answers were reviewed by a board certified advanced clinical practitioner to complete your personal care plan.  Depending on the condition, your plan could have included both over the counter or prescription medications. If there is a problem please reply  once you have received a response from your provider. Your safety is important to [place]  If you have drug allergies check your prescription carefully.    You can use MyChart to ask questions about today's visit, request a non-urgent call back, or ask for a  work or school excuse for 24 hours related to this e-Visit. If it has been greater than 24 hours you will need to follow up with your provider, or enter a new e-Visit to address those concerns. You will get an e-mail in the next two days asking about your experience.  I hope that your e-visit has been valuable and will speed your recovery. Thank you for using e-visits.

## 2016-10-29 ENCOUNTER — Other Ambulatory Visit: Payer: Self-pay | Admitting: *Deleted

## 2016-10-29 ENCOUNTER — Encounter: Payer: Self-pay | Admitting: Family Medicine

## 2016-10-29 NOTE — Telephone Encounter (Signed)
Last office visit 09/14/2016.  Last refilled 09/20/2016 #30 with no refills.  Ok to refill. Patient is also requesting refill on Hycodan cough syrup (see Mychart message from 10/29/2016.

## 2016-10-30 MED ORDER — ALPRAZOLAM 0.25 MG PO TABS: 0.5000 mg | ORAL_TABLET | Freq: Every day | ORAL | 1 refills | Status: DC | PRN

## 2016-10-30 MED ORDER — HYDROCODONE-HOMATROPINE 5-1.5 MG/5ML PO SYRP: 5.0000 mL | ORAL_SOLUTION | Freq: Three times a day (TID) | ORAL | 0 refills | Status: DC | PRN

## 2016-10-30 NOTE — Telephone Encounter (Signed)
Left message for [name] that her prescription for Hycodan cough syrup is ready to be picked up at the front desk.

## 2016-10-30 NOTE — Telephone Encounter (Signed)
Alprazolam called into [place], [place] Phone: [phone]

## 2016-11-07 ENCOUNTER — Telehealth: Payer: Self-pay | Admitting: Family Medicine

## 2016-11-07 ENCOUNTER — Other Ambulatory Visit: Payer: Self-pay | Admitting: Family Medicine

## 2016-11-07 NOTE — Telephone Encounter (Signed)
Mr Nordhoff left v/m; pt's daughter tested positive for flu and is presently taking tamiflu; due to Mrs Stare being immuno suppressed due to Chron's disease should Mrs Avera start taking tamiflu. Mr Clagg request cb.

## 2016-11-07 NOTE — Telephone Encounter (Signed)
Last office visit 09/14/2016. Last refilled 04/24/2016 for #30 with no refills.  Ok to refill?

## 2016-11-08 MED ORDER — OSELTAMIVIR PHOSPHATE 75 MG PO CAPS: 75.0000 mg | ORAL_CAPSULE | Freq: Every day | ORAL | 0 refills | Status: DC

## 2016-11-08 NOTE — Telephone Encounter (Signed)
Mr. Fromer notified as instructed by telephone.

## 2016-11-08 NOTE — Telephone Encounter (Signed)
Fioricet called into [place], [place]: [phone]

## 2016-11-08 NOTE — Telephone Encounter (Signed)
Sent in tamiflu. Need to start within 48 hours of contact.

## 2016-11-09 ENCOUNTER — Other Ambulatory Visit: Payer: Self-pay | Admitting: *Deleted

## 2016-11-09 MED ORDER — ZOLPIDEM TARTRATE 10 MG PO TABS: 10.0000 mg | ORAL_TABLET | Freq: Every day | ORAL | 0 refills | Status: DC

## 2016-11-09 NOTE — Telephone Encounter (Signed)
Last office visit 09/14/2016.  Last refilled 10/09/2016 for #30 with no refills.  Ok to refill?

## 2016-11-09 NOTE — Telephone Encounter (Signed)
Zolpidem called into [place].

## 2016-11-13 ENCOUNTER — Other Ambulatory Visit: Payer: Self-pay

## 2016-11-13 DIAGNOSIS — K5090 Crohn's disease, unspecified, without complications: Secondary | ICD-10-CM

## 2016-11-14 ENCOUNTER — Ambulatory Visit (INDEPENDENT_AMBULATORY_CARE_PROVIDER_SITE_OTHER): Payer: BC Managed Care – PPO | Admitting: Family Medicine

## 2016-11-14 ENCOUNTER — Encounter: Payer: Self-pay | Admitting: Family Medicine

## 2016-11-14 VITALS — BP 118/80 | HR 111 | Temp 98.7°F | Wt 147.0 lb

## 2016-11-14 DIAGNOSIS — J0101 Acute recurrent maxillary sinusitis: Secondary | ICD-10-CM | POA: Diagnosis not present

## 2016-11-14 DIAGNOSIS — J069 Acute upper respiratory infection, unspecified: Secondary | ICD-10-CM | POA: Diagnosis not present

## 2016-11-14 DIAGNOSIS — K50119 Crohn's disease of large intestine with unspecified complications: Secondary | ICD-10-CM

## 2016-11-14 LAB — COMPREHENSIVE METABOLIC PANEL
ALT: 15 U/L (ref 0–35)
AST: 14 U/L (ref 0–37)
Albumin: 4.2 g/dL (ref 3.5–5.2)
Alkaline Phosphatase: 58 U/L (ref 39–117)
BUN: 8 mg/dL (ref 6–23)
CALCIUM: 8.7 mg/dL (ref 8.4–10.5)
CHLORIDE: 106 meq/L (ref 96–112)
CO2: 26 meq/L (ref 19–32)
Creatinine, Ser: 0.63 mg/dL (ref 0.40–1.20)
GFR: 110.77 mL/min (ref >60.00)
GLUCOSE: 92 mg/dL (ref 70–99)
Potassium: 3.6 mEq/L (ref 3.5–5.1)
Sodium: 139 mEq/L (ref 135–145)
Total Bilirubin: 0.7 mg/dL (ref 0.2–1.2)
Total Protein: 6.6 g/dL (ref 6.0–8.3)

## 2016-11-14 LAB — CBC WITH DIFFERENTIAL/PLATELET
BASOS ABS: 0.1 10*3/uL (ref 0.0–0.1)
BASOS PCT: 0.4 % (ref 0.0–3.0)
EOS ABS: 0.6 10*3/uL (ref 0.0–0.7)
Eosinophils Relative: 4.6 % (ref 0.0–5.0)
HEMATOCRIT: 39.3 % (ref 36.0–46.0)
Hemoglobin: 13.2 g/dL (ref 12.0–15.0)
LYMPHS ABS: 1.9 10*3/uL (ref 0.7–4.0)
LYMPHS PCT: 14.1 % (ref 12.0–46.0)
MCHC: 33.5 g/dL (ref 30.0–36.0)
MCV: 89.2 fl (ref 78.0–100.0)
MONOS PCT: 5.2 % (ref 3.0–12.0)
Monocytes Absolute: 0.7 10*3/uL (ref 0.1–1.0)
NEUTROS ABS: 10.3 10*3/uL — AB (ref 1.4–7.7)
NEUTROS PCT: 75.7 % (ref 43.0–77.0)
PLATELETS: 323 10*3/uL (ref 150.0–400.0)
RBC: 4.41 Mil/uL (ref 3.87–5.11)
RDW: 15.3 % (ref 11.5–15.5)
WBC: 13.7 10*3/uL — ABNORMAL HIGH (ref 4.0–10.5)

## 2016-11-14 MED ORDER — AZITHROMYCIN 250 MG PO TABS: ORAL_TABLET | ORAL | 0 refills | Status: DC

## 2016-11-14 MED ORDER — ALBUTEROL SULFATE (2.5 MG/3ML) 0.083% IN NEBU: 2.5000 mg | INHALATION_SOLUTION | Freq: Four times a day (QID) | RESPIRATORY_TRACT | 1 refills | Status: DC | PRN

## 2016-11-14 MED ORDER — HYDROCODONE-HOMATROPINE 5-1.5 MG/5ML PO SYRP: 5.0000 mL | ORAL_SOLUTION | Freq: Three times a day (TID) | ORAL | 0 refills | Status: DC | PRN

## 2016-11-14 MED ORDER — PREDNISONE 20 MG PO TABS
ORAL_TABLET | ORAL | 0 refills | Status: DC
Start: 2016-11-14 — End: 2017-01-14

## 2016-11-14 NOTE — Patient Instructions (Signed)
Use albuterol neb or inhaler every 4-6 hours while awake today and tomorrow Consider changing your antihistamine. New is Xyzal.   Upper Respiratory Infection, Adult Most upper respiratory infections (URIs) are a viral infection of the air passages leading to the lungs. A URI affects the nose, throat, and upper air passages. The most common type of URI is nasopharyngitis and is typically referred to as "the common cold." URIs run their course and usually go away on their own. Most of the time, a URI does not require medical attention, but sometimes a bacterial infection in the upper airways can follow a viral infection. This is called a secondary infection. Sinus and middle ear infections are common types of secondary upper respiratory infections. Bacterial pneumonia can also complicate a URI. A URI can worsen asthma and chronic obstructive pulmonary disease (COPD). Sometimes, these complications can require emergency medical care and may be life threatening. What are the causes? Almost all URIs are caused by viruses. A virus is a type of germ and can spread from one person to another. What increases the risk? You may be at risk for a URI if:  You smoke.  You have chronic heart or lung disease.  You have a weakened defense (immune) system.  You are very young or very old.  You have nasal allergies or asthma.  You work in crowded or poorly ventilated areas.  You work in health care facilities or schools. What are the signs or symptoms? Symptoms typically develop 2-3 days after you come in contact with a cold virus. Most viral URIs last 7-10 days. However, viral URIs from the influenza virus (flu virus) can last 14-18 days and are typically more severe. Symptoms may include:  Runny or stuffy (congested) nose.  Sneezing.  Cough.  Sore throat.  Headache.  Fatigue.  Fever.  Loss of appetite.  Pain in your forehead, behind your eyes, and over your cheekbones (sinus  pain).  Muscle aches. How is this diagnosed? Your health care provider may diagnose a URI by:  Physical exam.  Tests to check that your symptoms are not due to another condition such as:  Strep throat.  Sinusitis.  Pneumonia.  Asthma. How is this treated? A URI goes away on its own with time. It cannot be cured with medicines, but medicines may be prescribed or recommended to relieve symptoms. Medicines may help:  Reduce your fever.  Reduce your cough.  Relieve nasal congestion. Follow these instructions at home:  Take medicines only as directed by your health care provider.  Gargle warm saltwater or take cough drops to comfort your throat as directed by your health care provider.  Use a warm mist humidifier or inhale steam from a shower to increase air moisture. This may make it easier to breathe.  Drink enough fluid to keep your urine clear or pale yellow.  Eat soups and other clear broths and maintain good nutrition.  Rest as needed.  Return to work when your temperature has returned to normal or as your health care provider advises. You may need to stay home longer to avoid infecting others. You can also use a face mask and careful hand washing to prevent spread of the virus.  Increase the usage of your inhaler if you have asthma.  Do not use any tobacco products, including cigarettes, chewing tobacco, or electronic cigarettes. If you need help quitting, ask your health care provider. How is this prevented? The best way to protect yourself from getting a cold is  to practice good hygiene.  Avoid oral or hand contact with people with cold symptoms.  Wash your hands often if contact occurs. There is no clear evidence that vitamin C, vitamin E, echinacea, or exercise reduces the chance of developing a cold. However, it is always recommended to get plenty of rest, exercise, and practice good nutrition. Contact a health care provider if:  You are getting worse  rather than better.  Your symptoms are not controlled by medicine.  You have chills.  You have worsening shortness of breath.  You have brown or red mucus.  You have yellow or brown nasal discharge.  You have pain in your face, especially when you bend forward.  You have a fever.  You have swollen neck glands.  You have pain while swallowing.  You have white areas in the back of your throat. Get help right away if:  You have severe or persistent:  Headache.  Ear pain.  Sinus pain.  Chest pain.  You have chronic lung disease and any of the following:  Wheezing.  Prolonged cough.  Coughing up blood.  A change in your usual mucus.  You have a stiff neck.  You have changes in your:  Vision.  Hearing.  Thinking.  Mood. This information is not intended to replace advice given to you by your health care provider. Make sure you discuss any questions you have with your health care provider. Document Released: 06/05/2001 Document Revised: 08/12/2016 Document Reviewed: 03/17/2014 Elsevier Interactive Patient Education  2017 [employer].

## 2016-11-14 NOTE — Progress Notes (Signed)
Subjective:    Patient ID: [name], female    DOB: 07/01/1976, 40 y.o.   MRN: [id number]  HPI This is a 40 yo female who presents today with cough, congestion and fever x several weeks. Symptoms started with drainage and cough productive of yellow mucus. Taking Tessalon perles with no relief. She finished doxycycline from an evisit 10/28/16, but never felt completely better. Has been running fever to 101.2. Taking tylenol and sudafed. Nasal drainage "feels like it is imploding." Chest feels bruised from coughing. Has had vomiting and urinary incontinence from frequent, hard coughing. Has been using albuterol inhaler at night. Does not have any more solution for nebulizer machine. Hears wheezing. Is able to take azithromycin and prednisone. Has been taking Zyrtec for many years, does not seem to dry her up like it used to. Was given prescription for hycodan from Dr. Diona Browner but her husband accidentally threw it away.   Her daughter had the flu and the patient started Tamiflu about 7 days ago.     Past Medical History:  Diagnosis Date  . Anginal pain ([place]) only on 09/22/2014  . Anxiety   . Asthma   . Chest pain    a. s/p intermediate risk nuclear stress test on 09/23/14 and normal LHC on 09/24/14   . Chronic nausea   . Crohn disease ([place]) 2000  . Dysautonomia    recurrent syncope s/p ILR by Dr Doreatha Lew  . Endometriosis   . GERD (gastroesophageal reflux disease)   . H/O hiatal hernia   . Migraine   . Palpitations   . Pulmonary embolism affecting pregnancy   . Vertigo    Past Surgical History:  Procedure Laterality Date  . APPENDECTOMY  ~ 1992  . CESAREAN SECTION  2011   emergency  CS due to placental abruption  . CESAREAN SECTION  2014   "preeclampsia"  . COLON SURGERY    . CYSTOSCOPY W/ STONE MANIPULATION  X 2  . ENDOSCOPIC RELEASE TRANSVERSE CARPAL LIGAMENT OF HAND Right 12/2010  . ILEOCECETOMY  2002 X 2  . KNEE ARTHROSCOPY Bilateral 1988-1990  . LAPAROSCOPY FOR ECTOPIC  PREGNANCY  11/2012  . LEFT HEART CATHETERIZATION WITH CORONARY ANGIOGRAM N/A 09/24/2014   Procedure: LEFT HEART CATHETERIZATION WITH CORONARY ANGIOGRAM;  Surgeon: Leonie Man, MD;  Location: [facility] CATH LAB;  Service: Cardiovascular;  Laterality: N/A;  . LOOP RECORDER IMPLANT  11/2009   by DR Doreatha Lew  . NASAL SINUS SURGERY  ~ 2007  . PARTIAL COLECTOMY  2002   partial removed due to crohns  . TONSILLECTOMY  ~ 1996  . US ECHOCARDIOGRAPHY  11/11/2009   EF 55-60%   Family History  Problem Relation Age of Onset  . Hypertension Mother   . Hyperlipidemia Mother   . Arthritis Mother   . Diabetes Father   . Coronary artery disease Father   . Heart disease Father 20    MI age 42  . Hyperlipidemia Brother   . Hypertension Brother   . Colon cancer Paternal Grandmother    Social History  Substance Use Topics  . Smoking status: Never Smoker  . Smokeless tobacco: Never Used  . Alcohol use Yes      Review of Systems Per HPI    Objective:   Physical Exam  Constitutional: She is oriented to person, place, and time. She appears well-developed and well-nourished.  HENT:  Head: Normocephalic and atraumatic.  Right Ear: Tympanic membrane, external ear and ear canal normal.  Left Ear:  Tympanic membrane, external ear and ear canal normal.  Nose: Mucosal edema and rhinorrhea present. Right sinus exhibits maxillary sinus tenderness and frontal sinus tenderness. Left sinus exhibits maxillary sinus tenderness and frontal sinus tenderness.  Mouth/Throat: Uvula is midline and oropharynx is clear and moist.  Post nasal drainage.   Cardiovascular: Regular rhythm and normal heart sounds.  Tachycardia present.   Pulmonary/Chest: Effort normal and breath sounds normal.  Neurological: She is alert and oriented to person, place, and time.  Skin: Skin is warm and dry.  Psychiatric: She has a normal mood and affect. Her behavior is normal. Judgment and thought content normal.  Vitals  reviewed.     BP 118/80   Pulse (!) 111   Temp 98.7 F (37.1 C)   Wt 147 lb (66.7 kg)   SpO2 99%   BMI 25.23 kg/m  Wt Readings from Last 3 Encounters:  11/14/16 147 lb (66.7 kg)  09/26/16 146 lb 8 oz (66.5 kg)  09/14/16 149 lb (67.6 kg)       Assessment & Plan:  1. Crohn's disease of colon with complication ([place]) - CBC w/Diff - Comp Met (CMET)  2. Acute recurrent maxillary sinusitis - predniSONE (DELTASONE) 20 MG tablet; Take 3 tabs x 3 days, then 2 tabs x 3 days, then 1 tab x 3 days  Dispense: 18 tablet; Refill: 0 - azithromycin (ZITHROMAX) 250 MG tablet; Take 2 tabs PO x 1 dose, then 1 tab PO QD x 4 days  Dispense: 6 tablet; Refill: 0  3. Acute upper respiratory infection - use albuterol every 4-6 hours while awake today and tomorrow then prn - albuterol (PROVENTIL) (2.5 MG/3ML) 0.083% nebulizer solution; Take 3 mLs (2.5 mg total) by nebulization every 6 (six) hours as needed for wheezing or shortness of breath.  Dispense: 150 mL; Refill: 1 - predniSONE (DELTASONE) 20 MG tablet; Take 3 tabs x 3 days, then 2 tabs x 3 days, then 1 tab x 3 days  Dispense: 18 tablet; Refill: 0 - azithromycin (ZITHROMAX) 250 MG tablet; Take 2 tabs PO x 1 dose, then 1 tab PO QD x 4 days  Dispense: 6 tablet; Refill: 0 - HYDROcodone-homatropine (HYCODAN) 5-1.5 MG/5ML syrup; Take 5 mLs by mouth every 8 (eight) hours as needed for cough.  Dispense: 120 mL; Refill: 0  - RTC precautions reviewed Clarene Reamer, FNP-BC  [facility], [employer]  11/14/2016 11:34 AM

## 2016-11-19 ENCOUNTER — Encounter (HOSPITAL_COMMUNITY): Payer: BC Managed Care – PPO

## 2016-11-21 ENCOUNTER — Encounter (HOSPITAL_COMMUNITY)
Admission: RE | Admit: 2016-11-21 | Discharge: 2016-11-21 | Disposition: A | Discharge status: Home / Self Care | Payer: BC Managed Care – PPO | Source: Ambulatory Visit | Attending: Gastroenterology | Admitting: Gastroenterology

## 2016-11-21 ENCOUNTER — Encounter (HOSPITAL_COMMUNITY): Payer: Self-pay

## 2016-11-21 DIAGNOSIS — D509 Iron deficiency anemia, unspecified: Secondary | ICD-10-CM | POA: Insufficient documentation

## 2016-11-21 DIAGNOSIS — K5090 Crohn's disease, unspecified, without complications: Secondary | ICD-10-CM

## 2016-11-21 MED ORDER — VEDOLIZUMAB 300 MG IV SOLR
300.0000 mg | Freq: Once | INTRAVENOUS | Status: AC
  Administered 2016-11-21: 300 mg via INTRAVENOUS
  Filled 2016-11-21: qty 5

## 2016-11-21 MED ORDER — SODIUM CHLORIDE 0.9 % IV SOLN
Freq: Once | INTRAVENOUS | Status: AC
  Administered 2016-11-21: 11:00:00 via INTRAVENOUS

## 2016-11-21 NOTE — Discharge Instructions (Signed)
Vedolizumab injection solution What is this medicine? VEDOLIZUMAB (Ve doe LIZ you mab) is used to treat ulcerative colitis and Crohn's disease in adult patients. COMMON BRAND NAME(S): Entyvio What should I tell my health care provider before I take this medicine? They need to know if you have any of these conditions: -diabetes -hepatitis B or history of hepatitis B infection -HIV or AIDS -immune system problems -infection or history of infections -liver disease -recently received or scheduled to receive a vaccine -scheduled to have surgery -tuberculosis, a positive skin test for tuberculosis or have recently been in close contact with someone who has tuberculosis - an unusual or allergic reaction to vedolizumab, other medicines, foods, dyes, or preservatives -pregnant or trying to get pregnant -breast-feeding How should I use this medicine? This medicine is for infusion into a vein. It is given by a health care professional in a hospital or clinic setting. A special MedGuide will be given to you by the pharmacist with each prescription and refill. Be sure to read this information carefully each time. Talk to your pediatrician regarding the use of this medicine in children. This medicine is not approved for use in children. What if I miss a dose? It is important not to miss your dose. Call your doctor or health care professional if you are unable to keep an appointment. What may interact with this medicine? -steroid medicines like prednisone or cortisone -TNF-alpha inhibitors like natalizumab, adalimumab, and infliximab -vaccines What should I watch for while using this medicine? Your condition will be monitored carefully while you are receiving this medicine. Visit your doctor for regular check ups. Tell your doctor or healthcare professional if your symptoms do not start to get better or if they get worse. Stay away from people who are sick. Call your doctor or health care  professional for advice if you get a fever, chills or sore throat, or other symptoms of a cold or flu. Do not treat yourself. In some patients, this medicine may cause a serious brain infection that may cause death. If you have any problems seeing, thinking, speaking, walking, or standing, tell your doctor right away. If you cannot reach your doctor, get urgent medical care. What side effects may I notice from receiving this medicine? Side effects that you should report to your doctor or health care professional as soon as possible: -allergic reactions like skin rash, itching or hives, swelling of the face, lips, or tongue -breathing problems -changes in vision -chest pain -dark urine -depression, feelings of sadness -dizziness -general ill feeling or flu-like symptoms -irregular, missed, or painful menstrual periods -light-colored stools -loss of appetite, nausea -muscle weakness -problems with balance, talking, or walking -right upper belly pain -unusually weak or tired -yellowing of the eyes or skin Side effects that usually do not require medical attention (report to your doctor or health care professional if they continue or are bothersome): -aches, pains -headache -stomach upset -tiredness Where should I keep my medicine? This drug is given in a hospital or clinic and will not be stored at home.  2017 Elsevier/Gold Standard (2016-01-12 08:36:51)

## 2016-11-22 NOTE — Progress Notes (Signed)
Letter mailed with lab results and appointment information.

## 2016-12-04 ENCOUNTER — Other Ambulatory Visit: Payer: Self-pay

## 2016-12-04 ENCOUNTER — Ambulatory Visit (INDEPENDENT_AMBULATORY_CARE_PROVIDER_SITE_OTHER)
Admission: RE | Admit: 2016-12-04 | Discharge: 2016-12-04 | Disposition: A | Discharge status: Home / Self Care | Payer: BC Managed Care – PPO | Source: Ambulatory Visit | Attending: Gastroenterology | Admitting: Gastroenterology

## 2016-12-04 ENCOUNTER — Telehealth: Payer: Self-pay | Admitting: Internal Medicine

## 2016-12-04 ENCOUNTER — Other Ambulatory Visit: Payer: Self-pay | Admitting: Gastroenterology

## 2016-12-04 ENCOUNTER — Telehealth: Payer: Self-pay

## 2016-12-04 ENCOUNTER — Other Ambulatory Visit (INDEPENDENT_AMBULATORY_CARE_PROVIDER_SITE_OTHER): Payer: BC Managed Care – PPO

## 2016-12-04 ENCOUNTER — Encounter: Payer: Self-pay | Admitting: Gastroenterology

## 2016-12-04 ENCOUNTER — Other Ambulatory Visit: Payer: Self-pay | Admitting: Rehabilitation

## 2016-12-04 DIAGNOSIS — M5013 Cervical disc disorder with radiculopathy, cervicothoracic region: Secondary | ICD-10-CM

## 2016-12-04 DIAGNOSIS — R1031 Right lower quadrant pain: Secondary | ICD-10-CM

## 2016-12-04 DIAGNOSIS — R197 Diarrhea, unspecified: Secondary | ICD-10-CM

## 2016-12-04 DIAGNOSIS — K50919 Crohn's disease, unspecified, with unspecified complications: Secondary | ICD-10-CM

## 2016-12-04 LAB — CBC WITH DIFFERENTIAL/PLATELET
BASOS ABS: 0 10*3/uL (ref 0.0–0.1)
BASOS PCT: 0.5 % (ref 0.0–3.0)
EOS ABS: 0.1 10*3/uL (ref 0.0–0.7)
Eosinophils Relative: 1.1 % (ref 0.0–5.0)
HCT: 38.2 % (ref 36.0–46.0)
HEMOGLOBIN: 12.9 g/dL (ref 12.0–15.0)
Lymphocytes Relative: 27.5 % (ref 12.0–46.0)
Lymphs Abs: 2.5 10*3/uL (ref 0.7–4.0)
MCHC: 33.7 g/dL (ref 30.0–36.0)
MCV: 89.7 fl (ref 78.0–100.0)
MONO ABS: 0.5 10*3/uL (ref 0.1–1.0)
Monocytes Relative: 5.5 % (ref 3.0–12.0)
Neutro Abs: 5.9 10*3/uL (ref 1.4–7.7)
Neutrophils Relative %: 65.4 % (ref 43.0–77.0)
Platelets: 333 10*3/uL (ref 150.0–400.0)
RBC: 4.26 Mil/uL (ref 3.87–5.11)
RDW: 14.8 % (ref 11.5–15.5)
WBC: 9 10*3/uL (ref 4.0–10.5)

## 2016-12-04 LAB — COMPREHENSIVE METABOLIC PANEL
ALBUMIN: 4.3 g/dL (ref 3.5–5.2)
ALT: 14 U/L (ref 0–35)
AST: 13 U/L (ref 0–37)
Alkaline Phosphatase: 55 U/L (ref 39–117)
BUN: 15 mg/dL (ref 6–23)
CHLORIDE: 106 meq/L (ref 96–112)
CO2: 26 mEq/L (ref 19–32)
CREATININE: 0.73 mg/dL (ref 0.40–1.20)
Calcium: 8.7 mg/dL (ref 8.4–10.5)
GFR: 93.42 mL/min (ref >60.00)
Glucose, Bld: 101 mg/dL — ABNORMAL HIGH (ref 70–99)
Potassium: 3.4 mEq/L — ABNORMAL LOW (ref 3.5–5.1)
SODIUM: 138 meq/L (ref 135–145)
Total Bilirubin: 0.4 mg/dL (ref 0.2–1.2)
Total Protein: 6.8 g/dL (ref 6.0–8.3)

## 2016-12-04 LAB — C-REACTIVE PROTEIN: CRP: 0.1 mg/dL — AB (ref 0.5–20.0)

## 2016-12-04 NOTE — Telephone Encounter (Signed)
Patient called requesting results. Chart reviewed. Told her labs ok with mild hypokalemia. X-ray negative. C. Diff pending. She seemed fine and I did not offer any new treatment.TOLD TO CALL FIRST THING IN AM WHEN OFFICE OPENS to get further instructions from Dr. Havery Moros

## 2016-12-04 NOTE — Telephone Encounter (Signed)
Spoke to patient, she is having intermittent vomiting, is able to tolerate po, but then has urgency to use bathroom. Patient states she always has diarrhea, denies having a fever.  Patient advised that Dr. Havery Moros wants to get a 2 view abdominal xray, blood work and check a stool sample for c-diff. She will come to our building after she gets out of work today, around 3:30 pm. She also was advised to start on a liquid diet for the next day or two to see how this makes her feel. Dr. Havery Moros updated on how patient is feeling and that she will come by today to get this diagnostic testing completed.

## 2016-12-05 ENCOUNTER — Other Ambulatory Visit: Payer: Self-pay

## 2016-12-05 ENCOUNTER — Encounter: Payer: Self-pay | Admitting: Gastroenterology

## 2016-12-05 DIAGNOSIS — K50919 Crohn's disease, unspecified, with unspecified complications: Secondary | ICD-10-CM

## 2016-12-05 LAB — C. DIFFICILE GDH AND TOXIN A/B
C. difficile GDH: NOT DETECTED
C. difficile Toxin A/B: NOT DETECTED

## 2016-12-05 MED ORDER — NA SULFATE-K SULFATE-MG SULF 17.5-3.13-1.6 GM/177ML PO SOLN: 1.0000 | Freq: Once | ORAL | 0 refills | Status: AC

## 2016-12-05 MED ORDER — DICYCLOMINE HCL 10 MG PO CAPS: 10.0000 mg | ORAL_CAPSULE | Freq: Three times a day (TID) | ORAL | 0 refills | Status: DC | PRN

## 2016-12-05 NOTE — Telephone Encounter (Signed)
Thanks [name] for taking her call, I will contact her this morning.

## 2016-12-09 ENCOUNTER — Ambulatory Visit
Admission: RE | Admit: 2016-12-09 | Discharge: 2016-12-09 | Disposition: A | Discharge status: Home / Self Care | Payer: BC Managed Care – PPO | Source: Ambulatory Visit | Attending: Rehabilitation | Admitting: Rehabilitation

## 2016-12-09 DIAGNOSIS — M5013 Cervical disc disorder with radiculopathy, cervicothoracic region: Secondary | ICD-10-CM

## 2016-12-10 ENCOUNTER — Other Ambulatory Visit: Payer: Self-pay | Admitting: Family Medicine

## 2016-12-10 NOTE — Telephone Encounter (Signed)
Last office visit 11/14/2016 with D. Carlean Purl.  Last refilled 11/09/2016 for #30 with no refills.

## 2016-12-11 NOTE — Telephone Encounter (Signed)
Zolpidem called into [facility], [place] Phone: [phone]

## 2016-12-14 ENCOUNTER — Ambulatory Visit (AMBULATORY_SURGERY_CENTER): Payer: BC Managed Care – PPO | Admitting: Gastroenterology

## 2016-12-14 ENCOUNTER — Encounter: Payer: Self-pay | Admitting: Gastroenterology

## 2016-12-14 VITALS — BP 148/94 | HR 74 | Temp 99.1°F | Resp 11 | Ht 64.0 in | Wt 147.0 lb

## 2016-12-14 DIAGNOSIS — D131 Benign neoplasm of stomach: Secondary | ICD-10-CM | POA: Diagnosis not present

## 2016-12-14 DIAGNOSIS — K317 Polyp of stomach and duodenum: Secondary | ICD-10-CM | POA: Diagnosis not present

## 2016-12-14 DIAGNOSIS — K219 Gastro-esophageal reflux disease without esophagitis: Secondary | ICD-10-CM | POA: Diagnosis not present

## 2016-12-14 DIAGNOSIS — K50019 Crohn's disease of small intestine with unspecified complications: Secondary | ICD-10-CM | POA: Diagnosis present

## 2016-12-14 MED ORDER — RIFAXIMIN 550 MG PO TABS: 550.0000 mg | ORAL_TABLET | Freq: Three times a day (TID) | ORAL | 0 refills | Status: DC

## 2016-12-14 MED ORDER — SODIUM CHLORIDE 0.9 % IV SOLN: 500.0000 mL | INTRAVENOUS | Status: DC

## 2016-12-14 NOTE — Op Note (Signed)
[facility] Patient Name: [name] Procedure Date: 12/14/2016 2:07 PM MRN: [id number] Endoscopist: Remo Lipps P. Armbruster MD, MD Age: 40 Referring MD:  Date of Birth: 1976/07/11 Gender: Female Account #: [id number] Procedure:                Upper GI endoscopy Indications:              Heartburn - persistent symptoms despite Dexilant                            and zantac daily Medicines:                Monitored Anesthesia Care Procedure:                Pre-Anesthesia Assessment:                           - Prior to the procedure, a History and Physical                            was performed, and patient medications and                            allergies were reviewed. The patient's tolerance of                            previous anesthesia was also reviewed. The risks                            and benefits of the procedure and the sedation                            options and risks were discussed with the patient.                            All questions were answered, and informed consent                            was obtained. Prior Anticoagulants: The patient has                            taken no previous anticoagulant or antiplatelet                            agents. ASA Grade Assessment: II - A patient with                            mild systemic disease. After reviewing the risks                            and benefits, the patient was deemed in                            satisfactory condition to undergo the procedure.  After obtaining informed consent, the endoscope was                            passed under direct vision. Throughout the                            procedure, the patient's blood pressure, pulse, and                            oxygen saturations were monitored continuously. The                            Model GIF-HQ190 [id number]) scope was introduced                            through the mouth, and advanced  to the second part                            of duodenum. The upper GI endoscopy was                            accomplished without difficulty. The patient                            tolerated the procedure well. Scope In: Scope Out: Findings:                 Esophagogastric landmarks were identified: the                            Z-line was found at 35 cm, the gastroesophageal                            junction was found at 35 cm and the upper extent of                            the gastric folds was found at 36 cm from the                            incisors.                           A 1 cm hiatal hernia was present.                           The exam of the esophagus was otherwise normal. No                            esophagitis appreciated.                           Multiple medium sessile polyps with no stigmata of                            recent bleeding were found  in the gastric fundus                            and in the gastric body. Most < 62m, a few larger                            than 521m the 2 largest polyps were removed with a                            hot snare. Resection and retrieval were complete.                           The exam of the stomach was otherwise normal.                           The duodenal bulb and second portion of the                            duodenum were normal. Complications:            No immediate complications. Estimated blood loss:                            Minimal. Estimated Blood Loss:     Estimated blood loss was minimal. Impression:               - Esophagogastric landmarks identified.                           - 1 cm hiatal hernia.                           - Normal esophagus otherwise - no inflammatory                            changes                           - Multiple gastric polyps. Resected and retrieved.                           - Normal duodenal bulb and second portion of the                             duodenum.                           Overall, suspect the patient has non-erosive reflux                            disease ([place]) despite high dose PPI. Recommend                            manometry and 24 HR pH study to further evaluate. Recommendation:           - Patient has a contact number available for  emergencies. The signs and symptoms of potential                            delayed complications were discussed with the                            patient. Return to normal activities tomorrow.                            Written discharge instructions were provided to the                            patient.                           - Resume previous diet.                           - Continue present medications.                           - Await pathology results.                           - Recommend esophageal manometry and 24 HR pH                            impedance testing on PPI if symptoms persist and                            this test has not been done before                           - Add gaviscon PRN for breakthrough symptoms Remo Lipps P. Armbruster MD, MD 12/14/2016 2:21:55 PM This report has been signed electronically.

## 2016-12-14 NOTE — Patient Instructions (Addendum)
YOU HAD AN ENDOSCOPIC PROCEDURE TODAY AT [facility]:   Refer to the procedure report that was given to you for any specific questions about what was found during the examination.  If the procedure report does not answer your questions, please call your gastroenterologist to clarify.  If you requested that your care partner not be given the details of your procedure findings, then the procedure report has been included in a sealed envelope for you to review at your convenience later.  YOU SHOULD EXPECT: Some feelings of bloating in the abdomen. Passage of more gas than usual.  Walking can help get rid of the air that was put into your GI tract during the procedure and reduce the bloating. If you had a lower endoscopy (such as a colonoscopy or flexible sigmoidoscopy) you may notice spotting of blood in your stool or on the toilet paper. If you underwent a bowel prep for your procedure, you may not have a normal bowel movement for a few days.  Please Note:  You might notice some irritation and congestion in your nose or some drainage.  This is from the oxygen used during your procedure.  There is no need for concern and it should clear up in a day or so.  SYMPTOMS TO REPORT IMMEDIATELY:   Following lower endoscopy (colonoscopy or flexible sigmoidoscopy):  Excessive amounts of blood in the stool  Significant tenderness or worsening of abdominal pains  Swelling of the abdomen that is new, acute  Fever of 100F or higher   Following upper endoscopy (EGD)  Vomiting of blood or coffee ground material  New chest pain or pain under the shoulder blades  Painful or persistently difficult swallowing  New shortness of breath  Fever of 100F or higher  Black, tarry-looking stools  For urgent or emergent issues, a gastroenterologist can be reached at any hour by calling [phone].   DIET:  We do recommend a small meal at first, but then you may proceed to your regular diet.  Drink  plenty of fluids but you should avoid alcoholic beverages for 24 hours.  ACTIVITY:  You should plan to take it easy for the rest of today and you should NOT DRIVE or use heavy machinery until tomorrow (because of the sedation medicines used during the test).    FOLLOW UP: Our staff will call the number listed on your records the next business day following your procedure to check on you and address any questions or concerns that you may have regarding the information given to you following your procedure. If we do not reach you, we will leave a message.  However, if you are feeling well and you are not experiencing any problems, there is no need to return our call.  We will assume that you have returned to your regular daily activities without incident.  Hiatal Hernia (handout given) Polyps (handout given) Await for biopsy results  Hemorrhoids (handout given)  If any biopsies were taken you will be contacted by phone or by letter within the next 1-3 weeks.  Please call us at [phone] if you have not heard about the biopsies in 3 weeks.    SIGNATURES/CONFIDENTIALITY: You and/or your care partner have signed paperwork which will be entered into your electronic medical record.  These signatures attest to the fact that that the information above on your After Visit Summary has been reviewed and is understood.  Full responsibility of the confidentiality of this discharge information lies with  you and/or your care-partner.YOU HAD AN ENDOSCOPIC PROCEDURE TODAY AT [facility]:   Refer to the procedure report that was given to you for any specific questions about what was found during the examination.  If the procedure report does not answer your questions, please call your gastroenterologist to clarify.  If you requested that your care partner not be given the details of your procedure findings, then the procedure report has been included in a sealed envelope for you to review at your  convenience later.  YOU SHOULD EXPECT: Some feelings of bloating in the abdomen. Passage of more gas than usual.  Walking can help get rid of the air that was put into your GI tract during the procedure and reduce the bloating. If you had a lower endoscopy (such as a colonoscopy or flexible sigmoidoscopy) you may notice spotting of blood in your stool or on the toilet paper. If you underwent a bowel prep for your procedure, you may not have a normal bowel movement for a few days.  Please Note:  You might notice some irritation and congestion in your nose or some drainage.  This is from the oxygen used during your procedure.  There is no need for concern and it should clear up in a day or so.  SYMPTOMS TO REPORT IMMEDIATELY:   Following lower endoscopy (colonoscopy or flexible sigmoidoscopy):  Excessive amounts of blood in the stool  Significant tenderness or worsening of abdominal pains  Swelling of the abdomen that is new, acute  Fever of 100F or higher   Following upper endoscopy (EGD)  Vomiting of blood or coffee ground material  New chest pain or pain under the shoulder blades  Painful or persistently difficult swallowing  New shortness of breath  Fever of 100F or higher  Black, tarry-looking stools  For urgent or emergent issues, a gastroenterologist can be reached at any hour by calling [phone].   DIET:  We do recommend a small meal at first, but then you may proceed to your regular diet.  Drink plenty of fluids but you should avoid alcoholic beverages for 24 hours.  ACTIVITY:  You should plan to take it easy for the rest of today and you should NOT DRIVE or use heavy machinery until tomorrow (because of the sedation medicines used during the test).    FOLLOW UP: Our staff will call the number listed on your records the next business day following your procedure to check on you and address any questions or concerns that you may have regarding the information given to  you following your procedure. If we do not reach you, we will leave a message.  However, if you are feeling well and you are not experiencing any problems, there is no need to return our call.  We will assume that you have returned to your regular daily activities without incident.  If any biopsies were taken you will be contacted by phone or by letter within the next 1-3 weeks.  Please call us at [phone] if you have not heard about the biopsies in 3 weeks.    SIGNATURES/CONFIDENTIALITY: You and/or your care partner have signed paperwork which will be entered into your electronic medical record.  These signatures attest to the fact that that the information above on your After Visit Summary has been reviewed and is understood.  Full responsibility of the confidentiality of this discharge information lies with you and/or your care-partner.  Polyps (handout given) Hiatal Hernia (handout given)  Hemorrhoids (handout given) Await for biopsy results Rifaximin 550 mg three times a day for 2 weeks no refills

## 2016-12-14 NOTE — Progress Notes (Signed)
Called to room to assist during endoscopic procedure.  Patient ID and intended procedure confirmed with present staff. Received instructions for my participation in the procedure from the performing physician.  

## 2016-12-14 NOTE — Op Note (Signed)
[place] Patient Name: [name] Procedure Date: 12/14/2016 1:36 PM MRN: [id number] Endoscopist: Remo Lipps P. Armbruster MD, MD Age: 40 Referring MD:  Date of Birth: 11/05/1976 Gender: Female Account #: [id number] Procedure:                Colonoscopy Indications:              Follow-up of Crohn's disease of the small bowel and                            colon - longstanding ileocolonic Crohns, now on                            Entyio, occasional abdominal pain and loose stools Medicines:                Monitored Anesthesia Care Procedure:                Pre-Anesthesia Assessment:                           - Prior to the procedure, a History and Physical                            was performed, and patient medications and                            allergies were reviewed. The patient's tolerance of                            previous anesthesia was also reviewed. The risks                            and benefits of the procedure and the sedation                            options and risks were discussed with the patient.                            All questions were answered, and informed consent                            was obtained. Prior Anticoagulants: The patient has                            taken no previous anticoagulant or antiplatelet                            agents. ASA Grade Assessment: II - A patient with                            mild systemic disease. After reviewing the risks                            and benefits, the patient was deemed in  satisfactory condition to undergo the procedure.                           After obtaining informed consent, the colonoscope                            was passed under direct vision. Throughout the                            procedure, the patient's blood pressure, pulse, and                            oxygen saturations were monitored continuously. The   Model PCF-H190L [id number]) scope was introduced                            through the anus and advanced to the the                            ileocolonic anastomosis. The colonoscopy was                            performed without difficulty. The patient tolerated                            the procedure well. The quality of the bowel                            preparation was adequate. The terminal ileum and                            the rectum were photographed. Scope In: 1:43:16 PM Scope Out: 2:04:25 PM Scope Withdrawal Time: 0 hours 16 minutes 4 seconds  Total Procedure Duration: 0 hours 21 minutes 9 seconds  Findings:                 The perianal and digital rectal examinations were                            normal.                           The terminal ileum appeared normal without any                            inflammatory changes                           There was evidence of a prior end-to-side                            ileo-colonic anastomosis in the ascending colon.                            The entrance to the ileum / anastomosis was  angulated a took a few minutes to intubate the                            ileum. The anastomosis was patent and was                            characterized by healthy appearing mucosa for the                            most part with one diminutive apthous ulceration,                            could be due to post surgical change.                           Internal hemorrhoids were found during retroflexion.                           The exam was otherwise without abnormality. No                            inflammatory changes of the colon                           Biopsies for histology were taken with a cold                            forceps from the right colon, left colon and                            transverse colon for surveillance purposes. Complications:            No immediate complications.  Estimated blood loss:                            Minimal. Estimated Blood Loss:     Estimated blood loss was minimal. Impression:               - The examined portion of the ileum was normal.                           - Patent end-to-side ileo-colonic anastomosis,                            characterized by healthy appearing mucosa.                           - Internal hemorrhoids.                           - The examination was otherwise normal.                           - Biopsies were taken with a cold forceps from the  right colon, left colon and transverse colon.                           Overall, excellent control of Crohns disease on                            Entyvio. It is possible the patient's symptoms are                            due to small bowel bacterial overgrowth (SIBO),                            patient is at risk for this with history of                            ileocecectomy. Recommendation:           - Patient has a contact number available for                            emergencies. The signs and symptoms of potential                            delayed complications were discussed with the                            patient. Return to normal activities tomorrow.                            Written discharge instructions were provided to the                            patient.                           - Resume previous diet.                           - Continue present medications.                           - Trial of Rifaximin 523m TID for 2 weeks to see                            if this helps symptoms                           - Await pathology results. SRemo LippsP. Armbruster MD, MD 12/14/2016 23:54:56PM This report has been signed electronically.

## 2016-12-18 ENCOUNTER — Telehealth: Payer: Self-pay | Admitting: *Deleted

## 2016-12-18 ENCOUNTER — Telehealth: Payer: Self-pay

## 2016-12-18 NOTE — Telephone Encounter (Signed)
  Follow up Call-  Call back number 12/14/2016 11/09/2015  Post procedure Call Back phone  # [phone]   Permission to leave phone message Yes Yes  Some recent data might be hidden   [facility]

## 2016-12-18 NOTE — Telephone Encounter (Signed)
-----   Message from Manus Gunning, MD sent at 12/14/2016  4:23 PM EST ----- Almyra Free next week can you contact Mrs. Caldeira and help schedule a manometry with 24 HR pH impedance study while ON her Dexilant? Thanks much Dr. Loni Muse

## 2016-12-19 ENCOUNTER — Other Ambulatory Visit: Payer: Self-pay

## 2016-12-19 DIAGNOSIS — K219 Gastro-esophageal reflux disease without esophagitis: Secondary | ICD-10-CM

## 2016-12-19 NOTE — Progress Notes (Signed)
Test ordered

## 2016-12-20 ENCOUNTER — Other Ambulatory Visit: Payer: Self-pay

## 2016-12-20 NOTE — Progress Notes (Signed)
Letter mailed

## 2016-12-20 NOTE — Telephone Encounter (Signed)
Patient advised, test scheduled for 12/31/16 at 8:30, [name]. Mailed information to patient, instructed to stay on Dexilant.

## 2016-12-26 ENCOUNTER — Encounter: Payer: Self-pay | Admitting: Gastroenterology

## 2016-12-31 ENCOUNTER — Ambulatory Visit (HOSPITAL_COMMUNITY)
Admission: RE | Admit: 2016-12-31 | Discharge: 2016-12-31 | Disposition: A | Discharge status: Home / Self Care | Payer: BC Managed Care – PPO | Source: Ambulatory Visit | Attending: Gastroenterology | Admitting: Gastroenterology

## 2016-12-31 ENCOUNTER — Telehealth: Payer: Self-pay | Admitting: Gastroenterology

## 2016-12-31 ENCOUNTER — Encounter (HOSPITAL_COMMUNITY)
Admission: RE | Disposition: A | Discharge status: Home / Self Care | Payer: Self-pay | Source: Ambulatory Visit | Attending: Gastroenterology

## 2016-12-31 DIAGNOSIS — K219 Gastro-esophageal reflux disease without esophagitis: Secondary | ICD-10-CM

## 2016-12-31 HISTORY — PX: ESOPHAGEAL MANOMETRY: SHX5429

## 2016-12-31 HISTORY — PX: 24 HOUR PH STUDY: SHX5419

## 2016-12-31 SURGERY — MANOMETRY, ESOPHAGUS

## 2016-12-31 MED ORDER — LIDOCAINE VISCOUS 2 % MT SOLN
OROMUCOSAL | Status: AC
  Filled 2016-12-31: qty 15

## 2016-12-31 SURGICAL SUPPLY — 2 items
FACESHIELD LNG OPTICON STERILE (SAFETY) IMPLANT
GLOVE BIO SURGEON STRL SZ8 (GLOVE) ×6 IMPLANT

## 2016-12-31 NOTE — Progress Notes (Signed)
Esophageal manometry  And PH impedence study done per protocol.  Pt tolerated both procedures well, without complications.  Report to be sent to Dr. Havery Moros.  Laverta Baltimore, RN

## 2016-12-31 NOTE — Telephone Encounter (Signed)
Esophageal manometry testing normal, without pathology to cause her symptoms. Patient will be contacted once results of 24 HR pH impedance testing is completed.

## 2017-01-01 ENCOUNTER — Encounter (HOSPITAL_COMMUNITY): Payer: Self-pay | Admitting: Gastroenterology

## 2017-01-02 ENCOUNTER — Encounter: Payer: Self-pay | Admitting: Gastroenterology

## 2017-01-03 ENCOUNTER — Other Ambulatory Visit: Payer: Self-pay | Admitting: Gastroenterology

## 2017-01-03 ENCOUNTER — Telehealth: Payer: Self-pay | Admitting: Gastroenterology

## 2017-01-03 DIAGNOSIS — J3489 Other specified disorders of nose and nasal sinuses: Secondary | ICD-10-CM

## 2017-01-03 DIAGNOSIS — K50019 Crohn's disease of small intestine with unspecified complications: Secondary | ICD-10-CM

## 2017-01-03 MED ORDER — METRONIDAZOLE 500 MG PO TABS: 500.0000 mg | ORAL_TABLET | Freq: Three times a day (TID) | ORAL | 0 refills | Status: AC

## 2017-01-03 MED ORDER — IPRATROPIUM BROMIDE 0.03 % NA SOLN: 2.0000 | Freq: Two times a day (BID) | NASAL | 3 refills | Status: DC

## 2017-01-03 NOTE — Telephone Encounter (Signed)
I called and left a message, no answer, will call back. Called both cell and work number

## 2017-01-03 NOTE — Telephone Encounter (Signed)
Called patient and reviewed results. I don't think reflux is causing her symptoms. When she describes symptoms further, main issue is frequent throat clearing and gagging. This is definitively worse when she does not take Dexilant, so she does think she benefits from it, but I don't think reflux is driving the process based on her [place] study.   I asked her about sinus issues and it appears she does have some post-nasal drip despite taking Zyrtec. I offered her a trial of atrovent nasal spray and will see if this helps. She wants to continue her Dexilant otherwise for now until her symptoms are better controlled.   Otherwise, she reports nausea to rifaximin which she has not tolerated. She has done well with flagyl in the past, will offer her this, 530m TID for 14 days for empiric therapy of SIBO to see if she has benefit. She will let me know how she is doing if not better. All questions answered. She will f/u in a few months otherwise.

## 2017-01-03 NOTE — Telephone Encounter (Signed)
The results of esophageal manometry and 24 HR pH testing are as follows:  Manometry is normal, no evidence of dysmotility  24 HR pH impedance testing on PPI shows the following: - Demeester score of 4.1 (14.72 = upper limit of normal) - normal, no pathologic acid reflux on PPI - 41 symptoms reported, only 5 episodes related to reflux. Symptom index of only 12% for reflux symptoms  Summary and Interpretation: - study consistent with well controlled reflux on PPI - reflux episodes do NOT correlate with reported symptoms - I suspect patient may have functional heartburn based on this result, along with normal esophageal manometry  Recommend: -consider a switch from Zoloft to TCA (Elavil) for functional heartburn, which may also help symptoms of diarrhea  -patient will be contacted with results and further recommendations

## 2017-01-07 ENCOUNTER — Telehealth: Payer: Self-pay | Admitting: *Deleted

## 2017-01-07 NOTE — Telephone Encounter (Signed)
Pt left voicemail at Triage. 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 There is a medical form that pt and spouse will need to have filled out (Spouse it Dr. Rometta Emery pt too). Pt wants to know if that's a form that they both can drop off and pick up or do they both need to schedule an appt with Dr. Diona Browner

## 2017-01-08 ENCOUNTER — Other Ambulatory Visit: Payer: Self-pay

## 2017-01-08 DIAGNOSIS — K50119 Crohn's disease of large intestine with unspecified complications: Secondary | ICD-10-CM

## 2017-01-08 NOTE — Telephone Encounter (Signed)
[name] had a physical in 05/2016.  [name] needs to schedule CPE prior to forms being completed.  [place] notified and CPE scheduled for 01/15/17 at 3:30 pm.  [name] will bring forms with her to that appointment.

## 2017-01-08 NOTE — Telephone Encounter (Signed)
If they have both had a CPX in last year.. I can fill out forms without seeing. Likely 25 $ charge per form but need to review.

## 2017-01-11 ENCOUNTER — Other Ambulatory Visit: Payer: Self-pay

## 2017-01-11 DIAGNOSIS — K50919 Crohn's disease, unspecified, with unspecified complications: Secondary | ICD-10-CM

## 2017-01-13 ENCOUNTER — Encounter: Payer: Self-pay | Admitting: Family Medicine

## 2017-01-14 ENCOUNTER — Encounter: Payer: Self-pay | Admitting: Gastroenterology

## 2017-01-14 ENCOUNTER — Telehealth: Payer: Self-pay | Admitting: Family Medicine

## 2017-01-14 ENCOUNTER — Encounter: Payer: Self-pay | Admitting: Family Medicine

## 2017-01-14 ENCOUNTER — Ambulatory Visit (INDEPENDENT_AMBULATORY_CARE_PROVIDER_SITE_OTHER): Payer: BC Managed Care – PPO | Admitting: Family Medicine

## 2017-01-14 ENCOUNTER — Telehealth: Payer: Self-pay | Admitting: Gastroenterology

## 2017-01-14 VITALS — BP 128/100 | HR 121 | Temp 99.1°F | Ht 64.0 in | Wt 146.2 lb

## 2017-01-14 DIAGNOSIS — D899 Disorder involving the immune mechanism, unspecified: Secondary | ICD-10-CM

## 2017-01-14 DIAGNOSIS — D849 Immunodeficiency, unspecified: Secondary | ICD-10-CM

## 2017-01-14 DIAGNOSIS — J111 Influenza due to unidentified influenza virus with other respiratory manifestations: Secondary | ICD-10-CM

## 2017-01-14 DIAGNOSIS — J4540 Moderate persistent asthma, uncomplicated: Secondary | ICD-10-CM

## 2017-01-14 MED ORDER — HYDROCODONE-HOMATROPINE 5-1.5 MG/5ML PO SYRP: ORAL_SOLUTION | ORAL | 0 refills | Status: DC

## 2017-01-14 MED ORDER — OSELTAMIVIR PHOSPHATE 75 MG PO CAPS: 75.0000 mg | ORAL_CAPSULE | Freq: Two times a day (BID) | ORAL | 0 refills | Status: DC

## 2017-01-14 MED ORDER — ZOLPIDEM TARTRATE 10 MG PO TABS: 10.0000 mg | ORAL_TABLET | Freq: Every day | ORAL | 1 refills | Status: DC

## 2017-01-14 NOTE — Telephone Encounter (Signed)
Pt called -  Insurance will not cover tamiflu.   Pt needs provider to call [facility] for Pt and spouse.  [phone].  If filling after 5:30, please [facility] [place] [facility].  Thanks

## 2017-01-14 NOTE — Telephone Encounter (Signed)
Spoke with pharmacist at [facility].  She states that [name] insurance plan will only cover Tamiflu once every 90 days.  Patient received Tamiflu in Nov 17.  PA completed on CoverMyMeds.  Sent for review.  [name] notified.

## 2017-01-14 NOTE — Progress Notes (Signed)
Dr. Frederico Hamman T. Alleta Avery, MD, [facility] [place], [address] Phone: [phone] Fax: [phone]  01/14/2017  Patient: [name], MRN: [id number], DOB: 02-07-1976, 41 y.o.  Primary Physician:  Eliezer Lofts, MD   Chief Complaint  Patient presents with  . Cough  . Nasal Congestion  . Fever  . Generalized Body Aches  . Headache   Subjective:   KELSAY HAGGARD presents with runny nose, sneezing, cough, sore throat, malaise, myalgias, arthralgia, chills, and fever. Sat. Started with a wet cough, 102.7 fever, legs and head hurt really bad.  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   The patent denies sore throat as the primary complaint. Denies sthortness of breath/wheezing, otalgia, facial pain, abdominal pain, changes in bowel or bladder.  Generally feels terrible  Tmax: 102.7  PMH, PHS, Allergies, Problem List, Medications, Family History, and Social History have all been reviewed.  Patient Active Problem List   Diagnosis Date Noted  . Acute back pain 10/12/2016  . UTI (urinary tract infection) 10/12/2016  . Headache 04/22/2016  . CSF leak 04/22/2016  . Serum sickness 04/18/2016  . Sepsis ([place]) 04/18/2016  . Pyrexia   . SIRS (systemic inflammatory response syndrome) (HCC)   . Acute sinus infection 03/27/2016  . Immunosuppressed status ([place]) 03/27/2016  . Acute right flank pain 10/14/2015  . Chronic pulmonary embolism ([place]) 09/14/2015  . Conjunctival hemorrhage of right eye 08/24/2015  . High risk medication use 07/19/2015  . Long-term (current) use of anticoagulants 07/19/2015  . Depression, major, recurrent, moderate ([place]) 06/10/2015  . Breast feeding status of mother 06/10/2015  . Severe hyperemesis gravidarum   . History of hyperemesis gravidarum 11/08/2014  . Abnormal nuclear stress test 09/24/2014  . Dysautonomia   . Palpitations   . HLD (hyperlipidemia)  09/23/2014  . Elevated LFTs 10/21/2012  . Generalized anxiety disorder 10/06/2012  . BPPV (benign paroxysmal positional vertigo) 03/15/2012  . Crohn's disease ([place]) 07/27/2011  . Reactive arthritis due to crohn's flares 07/27/2011  . Asthma, moderate persistent 07/27/2011  . Gastroesophageal reflux disease without esophagitis 07/27/2011  . Allergic rhinitis 07/27/2011  . Endometriosis 07/27/2011  . Migraine 07/27/2011  . Calcium nephrolithiasis 07/27/2011  . Vertigo 07/27/2011  . Syncope 11/10/2010    Past Medical History:  Diagnosis Date  . Anginal pain ([place]) only on 09/22/2014  . Anxiety   . Asthma   . Chest pain    a. s/p intermediate risk nuclear stress test on 09/23/14 and normal LHC on 09/24/14   . Chronic nausea   . Complication of anesthesia   . Crohn disease ([place]) 2000  . Dysautonomia    recurrent syncope s/p ILR by Dr Doreatha Lew  . Endometriosis   . GERD (gastroesophageal reflux disease)   . H/O hiatal hernia   . Migraine   . Palpitations   . PONV (postoperative nausea and vomiting)   . Pulmonary embolism affecting pregnancy   . Vertigo     Past Surgical History:  Procedure Laterality Date  . [place] STUDY N/A 12/31/2016   Procedure: [place] STUDY;  Surgeon: Manus Gunning, MD;  Location: [facility];  Service: Gastroenterology;  Laterality: N/A;  . APPENDECTOMY  ~ 1992  . CESAREAN SECTION  2011   emergency  CS due to placental abruption  . CESAREAN SECTION  2014   "preeclampsia"  . COLON SURGERY    . CYSTOSCOPY W/ STONE  MANIPULATION  X 2  . ENDOSCOPIC RELEASE TRANSVERSE CARPAL LIGAMENT OF HAND Right 12/2010  . ESOPHAGEAL MANOMETRY N/A 12/31/2016   Procedure: ESOPHAGEAL MANOMETRY (EM);  Surgeon: Manus Gunning, MD;  Location: [facility];  Service: Gastroenterology;  Laterality: N/A;  . ILEOCECETOMY  2002 X 2  . KNEE ARTHROSCOPY Bilateral 1988-1990  . LAPAROSCOPY FOR ECTOPIC PREGNANCY  11/2012  . LEFT HEART CATHETERIZATION WITH CORONARY  ANGIOGRAM N/A 09/24/2014   Procedure: LEFT HEART CATHETERIZATION WITH CORONARY ANGIOGRAM;  Surgeon: Leonie Man, MD;  Location: [facility] C[facility] LAB;  Service: Cardiovascular;  Laterality: N/A;  . LOOP RECORDER IMPLANT  11/2009   by DR Doreatha Lew  . NASAL SINUS SURGERY  ~ 2007  . PARTIAL COLECTOMY  2002   partial removed due to crohns  . TONSILLECTOMY  ~ 1996  . US ECHOCARDIOGRAPHY  11/11/2009   EF 55-60%    Social History   Social History  . Marital status: Married    Spouse name: Jonni Sanger  . Number of children: 2  . Years of education: college   Occupational History  .  Other    Airline pilot  .  [place] History Main Topics  . Smoking status: Never Smoker  . Smokeless tobacco: Never Used  . Alcohol use Yes  . Drug use: No  . Sexual activity: Yes    Birth control/ protection: Surgical   Other Topics Concern  . Not on file   Social History Narrative   Patient lives at home with her husband Jonni Sanger). Patient works full time for [employer].    Education [employer].   Right handed.   Caffeine one cup of coffee daily.    Family History  Problem Relation Age of Onset  . Hypertension Mother   . Hyperlipidemia Mother   . Arthritis Mother   . Diabetes Father   . Coronary artery disease Father   . Heart disease Father 46    MI age 53  . Hyperlipidemia Brother   . Hypertension Brother   . Colon cancer Paternal Grandmother     Allergies  Allergen Reactions  . Biaxin [Clarithromycin] Other (See Comments)    Other reaction(s): Bleeding Cant take due to Chrons disease  . Codeine Nausea And Vomiting and Other (See Comments)    Other reaction(s): Bleeding Dilaudid and demerol OK  . Compazine [Prochlorperazine Edisylate] Anaphylaxis  . Reglan [Metoclopramide] Anaphylaxis and Other (See Comments)    Other reaction(s): GI Upset (intolerance) Intensifies her Chron's  . Chlorhexidine Gluconate     Other reaction(s): Redness  . Other Rash     GuardIVa - PICC line antimicrobial dressing  Redness Resultant infection with PICC line x2 with irritation from adhesive "chloraprep"  . Oxycodone Nausea And Vomiting  . Sulfamethoxazole Rash and Other (See Comments)    Internal bleeding and kidney infection  . Sulfamethoxazole-Trimethoprim Other (See Comments)    "muscle twitches" Muscle spasms  . Chlorhexidine Other (See Comments)    Blisters after using a couple of times.   . Remicade [Infliximab] Other (See Comments)    Serum Sickness  Couldn't walk, open mouth, pain,   . Hydrocodone Nausea And Vomiting  . Macrobid [Nitrofurantoin] Nausea And Vomiting    Other reaction(s): GI Upset (intolerance)  . Phenergan [Promethazine Hcl] Other (See Comments)    Other reaction(s): Delusions (intolerance) Muscular seizures "muscle twitches"  . Promethazine Rash and Other (See Comments)    Muscular seizures  . Sulfa Antibiotics Hives and Other (See  Comments)    Hematemesis; documented while a young child    Medication list reviewed and updated in full in [place].  ROS as above, eating and drinking - tolerating PO. Urinating normally. No excessive vomitting or diarrhea.   Objective:   Blood pressure (!) 128/100, pulse (!) 121, temperature 99.1 F (37.3 C), temperature source Oral, height 5' 4"  (1.626 m), weight 146 lb 4 oz (66.3 kg), last menstrual period 01/09/2017, SpO2 97 %, currently breastfeeding.  Gen: WDWN, NAD; A & O x3, cooperative. Pleasant.Globally Non-toxic HEENT: Normocephalic and atraumatic. Throat clear, w/o exudate, R TM clear, L TM - good landmarks, No fluid present. rhinnorhea. No frontal or maxillary sinus T. MMM NECK: Anterior cervical  LAD is absent CV: RRR, No M/G/R, cap refill <2 sec PULM: Breathing comfortably in no respiratory distress. no wheezing, crackles, rhonchi ABD: S,NT,ND,+BS. No HSM. No rebound. EXT: No c/c/e PSYCH: Friendly, good eye contact MSK: Nml gait   Assessment and Plan:    Influenza with respiratory manifestation  Moderate persistent asthma without complication  Immunosuppressed status ([place])  High risk patient. Tamiflu + cough meds The patient's clinical exam and history is consistent with a diagnosis of influenza.  Supportive care, fluids, cough medicines as needed, and anti-pyretics. Infection control emphasized, including OOW or school until AF 24 hours.  Follow-up: No Follow-up on file.  New Prescriptions   HYDROCODONE-HOMATROPINE (HYCODAN) 5-1.5 MG/5ML SYRUP    1 tsp po at night before bed prn cough   OSELTAMIVIR (TAMIFLU) 75 MG CAPSULE    Take 1 capsule (75 mg total) by mouth 2 (two) times daily.   Modified Medications   Modified Medication Previous Medication   ZOLPIDEM (AMBIEN) 10 MG TABLET zolpidem (AMBIEN) 10 MG tablet      Take 1 tablet (10 mg total) by mouth at bedtime.    TAKE ONE TABLET BY MOUTH EVERY NIGHT AT BEDTIME    Signed,  Harshitha Fretz T. Nahlia Hellmann, MD   Patient's Medications  New Prescriptions   HYDROCODONE-HOMATROPINE (HYCODAN) 5-1.5 MG/5ML SYRUP    1 tsp po at night before bed prn cough   OSELTAMIVIR (TAMIFLU) 75 MG CAPSULE    Take 1 capsule (75 mg total) by mouth 2 (two) times daily.  Previous Medications   ACETAMINOPHEN (TYLENOL) 500 MG TABLET    Take 1,000 mg by mouth every 6 (six) hours as needed for headache.   ALBUTEROL (PROVENTIL HFA;VENTOLIN HFA) 108 (90 BASE) MCG/ACT INHALER    Inhale 2 puffs into the lungs every 6 (six) hours as needed for wheezing or shortness of breath.   ALBUTEROL (PROVENTIL) (2.5 MG/3ML) 0.083% NEBULIZER SOLUTION    Take 3 mLs (2.5 mg total) by nebulization every 6 (six) hours as needed for wheezing or shortness of breath.   ALPRAZOLAM (XANAX) 0.25 MG TABLET    Take 2 tablets (0.5 mg total) by mouth daily as needed for anxiety (Take as directed).   AZATHIOPRINE (IMURAN) 50 MG TABLET    TAKE 3 TABLETS BY MOUTH EVERY DAY AS DIRECTED.   BUTALBITAL-ACETAMINOPHEN-CAFFEINE (FIORICET, ESGIC) 50-325-40  MG TABLET    TAKE 2 TABLETS EVERY 6 HOURS AS NEEDED   CETIRIZINE HCL (ZYRTEC ALLERGY PO)    Take 1 tablet by mouth daily.    CYANOCOBALAMIN (,VITAMIN B-12,) 1000 MCG/ML INJECTION    Inject 1,000 mcg into the muscle every 21 ( twenty-one) days.   CYCLOBENZAPRINE (FLEXERIL) 10 MG TABLET    Take 0.5-1 tablets (5-10 mg total) by mouth 3 (three) times daily as  needed for muscle spasms.   DEXLANSOPRAZOLE (DEXILANT) 60 MG CAPSULE    Take 1 capsule (60 mg total) by mouth daily.   DICYCLOMINE (BENTYL) 10 MG CAPSULE    Take 1-2 capsules (10-20 mg total) by mouth every 8 (eight) hours as needed for spasms.   FLUDROCORTISONE (FLORINEF) 0.1 MG TABLET    Take 1 tablet (0.1 mg total) by mouth 2 (two) times daily. Take at 7am and 2pm   HYDROMORPHONE (DILAUDID) 2 MG TABLET    Take 1/2 to 1 tab every 4-6 hours as needed for pain.   IPRATROPIUM (ATROVENT) 0.03 % NASAL SPRAY    Place 2 sprays into both nostrils every 12 (twelve) hours.   METRONIDAZOLE (FLAGYL) 500 MG TABLET    Take 1 tablet (500 mg total) by mouth 3 (three) times daily.   RANITIDINE (ZANTAC) 150 MG TABLET    Take 1 tablet (150 mg total) by mouth every morning.   VEDOLIZUMAB (ENTYVIO IV)    Inject into the vein as directed.  Modified Medications   Modified Medication Previous Medication   ZOLPIDEM (AMBIEN) 10 MG TABLET zolpidem (AMBIEN) 10 MG tablet      Take 1 tablet (10 mg total) by mouth at bedtime.    TAKE ONE TABLET BY MOUTH EVERY NIGHT AT BEDTIME  Discontinued Medications   BENZONATATE (TESSALON PERLES) 100 MG CAPSULE    Take 1-2 capsules (100-200 mg total) by mouth every 8 (eight) hours as needed for cough.   HYDROCODONE-HOMATROPINE (HYCODAN) 5-1.5 MG/5ML SYRUP    Take 5 mLs by mouth every 8 (eight) hours as needed for cough.   ONDANSETRON (ZOFRAN) 4 MG TABLET    Take 1 tablet (4 mg total) by mouth every 8 (eight) hours as needed for nausea or vomiting.   PREDNISONE (DELTASONE) 20 MG TABLET    Take 3 tabs x 3 days, then 2 tabs x 3 days, then 1  tab x 3 days   PRENATAL VIT-FE FUMARATE-FA (PRENATAL PO)    Take 2 tablets by mouth every evening. Prenatal gummy vitamin.   SERTRALINE (ZOLOFT) 100 MG TABLET    TAKE 1 TABLET BY MOUTH DAILY

## 2017-01-14 NOTE — Telephone Encounter (Signed)
Patient had an office visit and infusion scheduled on 1/24 and just was diagnosed with the flu. It will be quite awhile before she can come back in here, do you want me to use a banding slot? Not sure those are even available. How long should she wait to get the [facility] rescheduled?

## 2017-01-14 NOTE — Telephone Encounter (Signed)
We will make our best effort. The patient has influenza. I cannot believe not covered.

## 2017-01-14 NOTE — Progress Notes (Signed)
Pre visit review using our clinic review tool, if applicable. No additional management support is needed unless otherwise documented below in the visit note. 

## 2017-01-14 NOTE — Telephone Encounter (Signed)
I just messaged her back. She needs to be afebrile for at least 24 hours prior to the infusion. The infusion appointments are completely separate from my appointments so a banding slot won't make a difference. If she can get an infusion within 2 weeks of her scheduled appointment that would be best. Thanks

## 2017-01-15 ENCOUNTER — Other Ambulatory Visit: Payer: Self-pay

## 2017-01-15 ENCOUNTER — Encounter: Payer: Self-pay | Admitting: Family Medicine

## 2017-01-15 ENCOUNTER — Encounter: Payer: BC Managed Care – PPO | Admitting: Family Medicine

## 2017-01-15 ENCOUNTER — Encounter: Payer: Self-pay | Admitting: Gastroenterology

## 2017-01-15 ENCOUNTER — Telehealth: Payer: Self-pay

## 2017-01-15 NOTE — Telephone Encounter (Signed)
Okay thanks for letting me know

## 2017-01-15 NOTE — Telephone Encounter (Signed)
Spoke to patient today, she still has a fever of 100.7 F, which is down from 103.0 F, therefore have rescheduled her Entyvio infusion from 1/24 to 1/26. Her appointment here is rescheduled to 2/27.

## 2017-01-15 NOTE — Telephone Encounter (Signed)
PA approved from 01/14/2017 thru 07/14/2017.  [place] notified by telephone.

## 2017-01-16 ENCOUNTER — Ambulatory Visit: Payer: BC Managed Care – PPO | Admitting: Gastroenterology

## 2017-01-16 ENCOUNTER — Encounter (HOSPITAL_COMMUNITY): Payer: BC Managed Care – PPO

## 2017-01-16 ENCOUNTER — Encounter (HOSPITAL_COMMUNITY): Admission: RE | Admit: 2017-01-16 | Payer: BC Managed Care – PPO | Source: Ambulatory Visit

## 2017-01-17 ENCOUNTER — Other Ambulatory Visit (HOSPITAL_COMMUNITY): Payer: Self-pay | Admitting: *Deleted

## 2017-01-18 ENCOUNTER — Ambulatory Visit (HOSPITAL_COMMUNITY)
Admission: RE | Admit: 2017-01-18 | Discharge: 2017-01-18 | Disposition: A | Discharge status: Home / Self Care | Payer: BC Managed Care – PPO | Source: Ambulatory Visit | Attending: Gastroenterology | Admitting: Gastroenterology

## 2017-01-18 ENCOUNTER — Telehealth: Payer: Self-pay | Admitting: Gastroenterology

## 2017-01-18 NOTE — Telephone Encounter (Signed)
Spoke to hospital and patient, according to hospital policy the patient needs to be 48 hours post Tamiflu and symptom free before she will be allowed to go there for infusion. Patient will finish her last dose of Tamiflu on 1/27, that puts earliest she can have infusion at 1/30.  Patient is concerned that this delay in receiving her infusion will set her back and she will have a flare up. She is also all out of time that she can take off of work. She had sent in a FMLA form to help with the required time off for the upcoming infusions, have you received this yet? Thanks.

## 2017-01-18 NOTE — Telephone Encounter (Signed)
Pt is currently waiting at the hospital for call back at phone number [phone].

## 2017-01-18 NOTE — Progress Notes (Signed)
Patient reports that she tested positive for flu and began Tamiflu on Monday. States she has been afebrile for 24 hours and that md had rescheduled her for today. Patient was informed by myself and charge nurse that she would not be able to receive her infusion today because the hospital is under flu restriction. Message left at Dr. Doyne Keel office that patient must be symptom free, post Tamiflu  and afebrile for 48 hours. Patient was concerned about the possibility of needing to restart loading dose. Message left at Dr. Doyne Keel to call patient and tell her what she is to do going forward. Patient is to call for appointment after she hears from physician and meets above criteria.  Patient verbalizes understanding of all of the above.

## 2017-01-21 ENCOUNTER — Telehealth: Payer: Self-pay

## 2017-01-21 NOTE — Telephone Encounter (Signed)
Unfortunately, patient cannot get scheduled back into short stay's schedule until 2/5 (monday) at [facility]. Due to patient's bad experience at [facility] last Friday, she did not want to go back there.

## 2017-01-21 NOTE — Telephone Encounter (Signed)
If we can reschedule to 1/30 that would be okay. The medication lasts in her system a very long time, I think it is okay to wait that long if that's what the hospital policy is we don't have a way around it, even though Entyvio should not increase her risk of infection.   I have the paperwork for FMLA on my desk, just seeing it, hope to have it done by the end of the day or tomorrow morning. Thanks

## 2017-01-21 NOTE — Telephone Encounter (Signed)
Okay, whichever is the first available that her preference is. Thanks

## 2017-01-21 NOTE — Telephone Encounter (Signed)
FMLA forms completed, faxed back. Original copy mailed back to patient. Copy sent to be scanned.

## 2017-01-21 NOTE — Telephone Encounter (Signed)
Patient advised that the new infusion date is 2/5 at 1:00. She has a conflict with work that day gave her the # to [facility] short stay scheduling.

## 2017-01-24 ENCOUNTER — Encounter: Payer: Self-pay | Admitting: Family Medicine

## 2017-01-25 ENCOUNTER — Encounter: Payer: Self-pay | Admitting: Family Medicine

## 2017-01-25 ENCOUNTER — Telehealth: Payer: BC Managed Care – PPO | Admitting: Nurse Practitioner

## 2017-01-25 DIAGNOSIS — J0100 Acute maxillary sinusitis, unspecified: Secondary | ICD-10-CM

## 2017-01-25 MED ORDER — DOXYCYCLINE HYCLATE 100 MG PO TABS: 100.0000 mg | ORAL_TABLET | Freq: Two times a day (BID) | ORAL | 0 refills | Status: DC

## 2017-01-25 NOTE — Progress Notes (Signed)

## 2017-01-28 ENCOUNTER — Encounter (HOSPITAL_COMMUNITY): Admission: RE | Admit: 2017-01-28 | Payer: BC Managed Care – PPO | Source: Ambulatory Visit

## 2017-01-31 ENCOUNTER — Ambulatory Visit (HOSPITAL_COMMUNITY)
Admission: RE | Admit: 2017-01-31 | Discharge: 2017-01-31 | Disposition: A | Discharge status: Home / Self Care | Payer: BC Managed Care – PPO | Source: Ambulatory Visit | Attending: Gastroenterology | Admitting: Gastroenterology

## 2017-01-31 ENCOUNTER — Ambulatory Visit (INDEPENDENT_AMBULATORY_CARE_PROVIDER_SITE_OTHER): Payer: BC Managed Care – PPO | Admitting: Family Medicine

## 2017-01-31 ENCOUNTER — Encounter: Payer: Self-pay | Admitting: Family Medicine

## 2017-01-31 VITALS — BP 144/96 | HR 82 | Temp 98.4°F | Ht 63.5 in | Wt 146.0 lb

## 2017-01-31 DIAGNOSIS — J4540 Moderate persistent asthma, uncomplicated: Secondary | ICD-10-CM

## 2017-01-31 DIAGNOSIS — D849 Immunodeficiency, unspecified: Secondary | ICD-10-CM

## 2017-01-31 DIAGNOSIS — F325 Major depressive disorder, single episode, in full remission: Secondary | ICD-10-CM | POA: Diagnosis not present

## 2017-01-31 DIAGNOSIS — D899 Disorder involving the immune mechanism, unspecified: Secondary | ICD-10-CM

## 2017-01-31 DIAGNOSIS — J3089 Other allergic rhinitis: Secondary | ICD-10-CM

## 2017-01-31 DIAGNOSIS — F411 Generalized anxiety disorder: Secondary | ICD-10-CM

## 2017-01-31 DIAGNOSIS — K5000 Crohn's disease of small intestine without complications: Secondary | ICD-10-CM

## 2017-01-31 DIAGNOSIS — K50119 Crohn's disease of large intestine with unspecified complications: Secondary | ICD-10-CM | POA: Diagnosis present

## 2017-01-31 MED ORDER — VEDOLIZUMAB 300 MG IV SOLR
300.0000 mg | Freq: Once | INTRAVENOUS | Status: AC
  Administered 2017-01-31: 300 mg via INTRAVENOUS
  Filled 2017-01-31: qty 5

## 2017-01-31 MED ORDER — MONTELUKAST SODIUM 10 MG PO TABS: 10.0000 mg | ORAL_TABLET | Freq: Every day | ORAL | 3 refills | Status: DC

## 2017-01-31 MED ORDER — SODIUM CHLORIDE 0.9 % IV SOLN
Freq: Once | INTRAVENOUS | Status: AC
  Administered 2017-01-31: 14:00:00 via INTRAVENOUS

## 2017-01-31 NOTE — Assessment & Plan Note (Signed)
On immunosuppressants for crohn's.

## 2017-01-31 NOTE — Progress Notes (Signed)
   Subjective:    Patient ID: [name], female    DOB: [birth year], 41 y.o.   MRN: [id number]  HPI    41 year old female presents for completion of adoption forms.  Recent flu has resolved.   Needs PPD but cannot get today on Thursday.   Asthma, moderate persistent, allergic rhinitis: Currently on zyrtec.Marland Kitchen She still has constant nasal drainage. Was under better control on singulair.. Will restart. No longer taking adsvair   Chron's disease: Stable on followed by GI,   In remission but controlled on  imuran and entivio infusions.  Depression, major moderate recurrent in remission, GAD:  She has been some stressed overall, not anxious but hard to relax. 5 She is no longer on zoloft, felt it made things worse.She is using alprazolam in limited fashion  (Occ 3 times a week to every other week).. Mainly for neurally mediated syncope.  Ambien helps sleep at night.  GAD7 5/21 PHQ    Has GYN Dr. Corinna Capra.  Review of Systems  Constitutional: Negative for fatigue and fever.  HENT: Negative for ear pain.   Eyes: Negative for pain.  Respiratory: Negative for chest tightness and shortness of breath.   Cardiovascular: Negative for chest pain, palpitations and leg swelling.  Gastrointestinal: Negative for abdominal pain.  Genitourinary: Negative for dysuria.       Objective:   Physical Exam  Constitutional: Vital signs are normal. She appears well-developed and well-nourished. She is cooperative.  Non-toxic appearance. She does not appear ill. No distress.  HENT:  Head: Normocephalic.  Right Ear: Hearing, tympanic membrane, external ear and ear canal normal. Tympanic membrane is not erythematous, not retracted and not bulging.  Left Ear: Hearing, tympanic membrane, external ear and ear canal normal. Tympanic membrane is not erythematous, not retracted and not bulging.  Nose: No mucosal edema or rhinorrhea. Right sinus exhibits no maxillary sinus tenderness and no frontal sinus  tenderness. Left sinus exhibits no maxillary sinus tenderness and no frontal sinus tenderness.  Mouth/Throat: Uvula is midline, oropharynx is clear and moist and mucous membranes are normal.  Eyes: Conjunctivae, EOM and lids are normal. Pupils are equal, round, and reactive to light. Lids are everted and swept, no foreign bodies found.  Neck: Trachea normal and normal range of motion. Neck supple. Carotid bruit is not present. No thyroid mass and no thyromegaly present.  Cardiovascular: Normal rate, regular rhythm, S1 normal, S2 normal, normal heart sounds, intact distal pulses and normal pulses.  Exam reveals no gallop and no friction rub.   No murmur heard. Pulmonary/Chest: Effort normal and breath sounds normal. No tachypnea. No respiratory distress. She has no decreased breath sounds. She has no wheezes. She has no rhonchi. She has no rales.  Abdominal: Soft. Normal appearance and bowel sounds are normal. There is no tenderness.  Neurological: She is alert.  Skin: Skin is warm, dry and intact. No rash noted.  Psychiatric: Her speech is normal and behavior is normal. Judgment and thought content normal. Her mood appears not anxious. Cognition and memory are normal. She does not exhibit a depressed mood.          Assessment & Plan:

## 2017-01-31 NOTE — Assessment & Plan Note (Signed)
Add leukotriene inhibitor to antihistamine.

## 2017-01-31 NOTE — Progress Notes (Signed)
Pre visit review using our clinic review tool, if applicable. No additional management support is needed unless otherwise documented below in the visit note. 

## 2017-01-31 NOTE — Assessment & Plan Note (Signed)
Will restart singulair for better control.

## 2017-01-31 NOTE — Assessment & Plan Note (Signed)
Stable in remission on immunosupressants.

## 2017-01-31 NOTE — Assessment & Plan Note (Signed)
Well controlled off antidepressant.

## 2017-01-31 NOTE — Patient Instructions (Addendum)
Return for PPD.  Restart singulair.

## 2017-01-31 NOTE — Assessment & Plan Note (Addendum)
Modeately well controlled off antidepressant. Using alprazolam prn. Recommended stress reduction and relaxation. Pt not interested in counseling at this time.

## 2017-02-05 ENCOUNTER — Encounter: Payer: Self-pay | Admitting: Family Medicine

## 2017-02-06 ENCOUNTER — Ambulatory Visit (INDEPENDENT_AMBULATORY_CARE_PROVIDER_SITE_OTHER): Payer: BC Managed Care – PPO | Admitting: Primary Care

## 2017-02-06 ENCOUNTER — Other Ambulatory Visit: Payer: Self-pay | Admitting: Gastroenterology

## 2017-02-06 ENCOUNTER — Encounter: Payer: Self-pay | Admitting: Primary Care

## 2017-02-06 VITALS — BP 130/86 | HR 79 | Temp 98.1°F | Ht 63.5 in | Wt 148.0 lb

## 2017-02-06 DIAGNOSIS — Z111 Encounter for screening for respiratory tuberculosis: Secondary | ICD-10-CM | POA: Diagnosis not present

## 2017-02-06 DIAGNOSIS — B9689 Other specified bacterial agents as the cause of diseases classified elsewhere: Secondary | ICD-10-CM | POA: Diagnosis not present

## 2017-02-06 DIAGNOSIS — J019 Acute sinusitis, unspecified: Secondary | ICD-10-CM | POA: Diagnosis not present

## 2017-02-06 MED ORDER — AMOXICILLIN-POT CLAVULANATE 875-125 MG PO TABS: 1.0000 | ORAL_TABLET | Freq: Two times a day (BID) | ORAL | 0 refills | Status: DC

## 2017-02-06 NOTE — Progress Notes (Signed)
Subjective:    Patient ID: [name], female    DOB: 08/26/1976, 41 y.o.   MRN: [id number]  HPI  [name] is a 41 year old female who presents today with a chief complaint of sinus pressure. Her sinus pressure has been present for the past 3 weeks. She completed an e-visit on 01/25/17 for sinus pressure, was diagnosed with acute bacterial sinusitis, and was prescribed Doxycycline. She took three total tablets of Doxycycline and stopped as she developed nausea and vomiting. Since she stopped the Doxycycline she's noticed a sudden return in and increase in sinus pressure, she is blowing thick green/yellow mucous from her nasal cavity. She has had fevers intermittently ranging up to 101. She denies cough.  Review of Systems  Constitutional: Positive for fatigue and fever.  HENT: Positive for congestion, ear pain, sinus pain and sinus pressure. Negative for sore throat.   Respiratory: Negative for cough and shortness of breath.   Neurological: Positive for headaches.       Past Medical History:  Diagnosis Date  . Anginal pain ([place]) only on 09/22/2014  . Anxiety   . Asthma   . Chest pain    a. s/p intermediate risk nuclear stress test on 09/23/14 and normal LHC on 09/24/14   . Chronic nausea   . Complication of anesthesia   . Crohn disease ([place]) 2000  . Dysautonomia    recurrent syncope s/p ILR by Dr Doreatha Lew  . Endometriosis   . GERD (gastroesophageal reflux disease)   . H/O hiatal hernia   . Migraine   . Palpitations   . PONV (postoperative nausea and vomiting)   . Pulmonary embolism affecting pregnancy   . Vertigo      Social History   Social History  . Marital status: Married    Spouse name: Jonni Sanger  . Number of children: 2  . Years of education: college   Occupational History  .  Other    Airline pilot  .  [place] History Main Topics  . Smoking status: Never Smoker  . Smokeless tobacco: Never Used  . Alcohol use Yes  . Drug use: No    . Sexual activity: Yes    Birth control/ protection: Surgical   Other Topics Concern  . Not on file   Social History Narrative   Patient lives at home with her husband Jonni Sanger). Patient works full time for [employer].    Education [employer].   Right handed.   Caffeine one cup of coffee daily.    Past Surgical History:  Procedure Laterality Date  . [place] STUDY N/A 12/31/2016   Procedure: [place] STUDY;  Surgeon: Manus Gunning, MD;  Location: [facility];  Service: Gastroenterology;  Laterality: N/A;  . APPENDECTOMY  ~ 1992  . CESAREAN SECTION  2011   emergency  CS due to placental abruption  . CESAREAN SECTION  2014   "preeclampsia"  . COLON SURGERY    . CYSTOSCOPY W/ STONE MANIPULATION  X 2  . ENDOSCOPIC RELEASE TRANSVERSE CARPAL LIGAMENT OF HAND Right 12/2010  . ESOPHAGEAL MANOMETRY N/A 12/31/2016   Procedure: ESOPHAGEAL MANOMETRY (EM);  Surgeon: Manus Gunning, MD;  Location: [facility];  Service: Gastroenterology;  Laterality: N/A;  . ILEOCECETOMY  2002 X 2  . KNEE ARTHROSCOPY Bilateral 1988-1990  . LAPAROSCOPY FOR ECTOPIC PREGNANCY  11/2012  . LEFT HEART CATHETERIZATION WITH CORONARY ANGIOGRAM N/A 09/24/2014   Procedure: LEFT HEART CATHETERIZATION WITH  CORONARY ANGIOGRAM;  Surgeon: Leonie Man, MD;  Location: [facility];  Service: Cardiovascular;  Laterality: N/A;  . LOOP RECORDER IMPLANT  11/2009   by DR Doreatha Lew  . NASAL SINUS SURGERY  ~ 2007  . PARTIAL COLECTOMY  2002   partial removed due to crohns  . TONSILLECTOMY  ~ 1996  . US ECHOCARDIOGRAPHY  11/11/2009   EF 55-60%    Family History  Problem Relation Age of Onset  . Hypertension Mother   . Hyperlipidemia Mother   . Arthritis Mother   . Diabetes Father   . Coronary artery disease Father   . Heart disease Father 110    MI age 41  . Hyperlipidemia Brother   . Hypertension Brother   . Colon cancer Paternal Grandmother     Allergies  Allergen Reactions  . Biaxin  [Clarithromycin] Other (See Comments)    Other reaction(s): Bleeding Cant take due to Chrons disease  . Codeine Nausea And Vomiting and Other (See Comments)    Other reaction(s): Bleeding Dilaudid and demerol OK  . Compazine [Prochlorperazine Edisylate] Anaphylaxis  . Reglan [Metoclopramide] Anaphylaxis and Other (See Comments)    Other reaction(s): GI Upset (intolerance) Intensifies her Chron's  . Chlorhexidine Gluconate     Other reaction(s): Redness  . Doxycycline Nausea And Vomiting  . Other Rash    GuardIVa - PICC line antimicrobial dressing  Redness Resultant infection with PICC line x2 with irritation from adhesive "chloraprep"  . Oxycodone Nausea And Vomiting  . Sulfamethoxazole Rash and Other (See Comments)    Internal bleeding and kidney infection  . Sulfamethoxazole-Trimethoprim Other (See Comments)    "muscle twitches" Muscle spasms  . Chlorhexidine Other (See Comments)    Blisters after using a couple of times.   . Remicade [Infliximab] Other (See Comments)    Serum Sickness  Couldn't walk, open mouth, pain,   . Hydrocodone Nausea And Vomiting  . Macrobid [Nitrofurantoin] Nausea And Vomiting    Other reaction(s): GI Upset (intolerance)  . Phenergan [Promethazine Hcl] Other (See Comments)    Other reaction(s): Delusions (intolerance) Muscular seizures "muscle twitches"  . Promethazine Rash and Other (See Comments)    Muscular seizures  . Sulfa Antibiotics Hives and Other (See Comments)    Hematemesis; documented while a young child    Current Outpatient Prescriptions on File Prior to Visit  Medication Sig Dispense Refill  . acetaminophen (TYLENOL) 500 MG tablet Take 1,000 mg by mouth every 6 (six) hours as needed for headache.    . albuterol (PROVENTIL HFA;VENTOLIN HFA) 108 (90 BASE) MCG/ACT inhaler Inhale 2 puffs into the lungs every 6 (six) hours as needed for wheezing or shortness of breath. 1 Inhaler 2  . albuterol (PROVENTIL) (2.5 MG/3ML) 0.083%  nebulizer solution Take 3 mLs (2.5 mg total) by nebulization every 6 (six) hours as needed for wheezing or shortness of breath. 150 mL 1  . ALPRAZolam (XANAX) 0.25 MG tablet Take 2 tablets (0.5 mg total) by mouth daily as needed for anxiety (Take as directed). 30 tablet 1  . azaTHIOprine (IMURAN) 50 MG tablet TAKE 3 TABLETS BY MOUTH EVERY DAY AS DIRECTED. 90 tablet 6  . butalbital-acetaminophen-caffeine (FIORICET, ESGIC) 50-325-40 MG tablet TAKE 2 TABLETS EVERY 6 HOURS AS NEEDED 20 tablet 1  . Cetirizine HCl (ZYRTEC ALLERGY PO) Take 1 tablet by mouth daily.     . cyanocobalamin (,VITAMIN B-12,) 1000 MCG/ML injection Inject 1,000 mcg into the muscle every 21 ( twenty-one) days.    . cyclobenzaprine (  FLEXERIL) 10 MG tablet Take 0.5-1 tablets (5-10 mg total) by mouth 3 (three) times daily as needed for muscle spasms. 30 tablet 0  . dexlansoprazole (DEXILANT) 60 MG capsule Take 1 capsule (60 mg total) by mouth daily. 30 capsule 5  . fludrocortisone (FLORINEF) 0.1 MG tablet Take 1 tablet (0.1 mg total) by mouth 2 (two) times daily. Take at 7am and 2pm 60 tablet 3  . HYDROmorphone (DILAUDID) 2 MG tablet Take 1/2 to 1 tab every 4-6 hours as needed for pain. 20 tablet 0  . montelukast (SINGULAIR) 10 MG tablet Take 1 tablet (10 mg total) by mouth at bedtime. 30 tablet 3  . ranitidine (ZANTAC) 150 MG tablet Take 1 tablet (150 mg total) by mouth every morning. 30 tablet 3  . Vedolizumab (ENTYVIO IV) Inject into the vein as directed.    . zolpidem (AMBIEN) 10 MG tablet Take 1 tablet (10 mg total) by mouth at bedtime. 30 tablet 1   Current Facility-Administered Medications on File Prior to Visit  Medication Dose Route Frequency Provider Last Rate Last Dose  . 0.9 %  sodium chloride infusion  500 mL Intravenous Continuous Manus Gunning, MD        BP 130/86   Pulse 79   Temp 98.1 F (36.7 C) (Oral)   Ht 5' 3.5" (1.613 m)   Wt 148 lb (67.1 kg)   LMP 01/09/2017   SpO2 99%   BMI 25.81 kg/m     Objective:   Physical Exam  Constitutional: She appears well-nourished.  HENT:  Right Ear: Tympanic membrane and ear canal normal.  Left Ear: Tympanic membrane and ear canal normal.  Nose: Mucosal edema present. Right sinus exhibits maxillary sinus tenderness and frontal sinus tenderness. Left sinus exhibits maxillary sinus tenderness and frontal sinus tenderness.  Mouth/Throat: Oropharynx is clear and moist.  Eyes: Conjunctivae are normal.  Neck: Neck supple.  Cardiovascular: Normal rate and regular rhythm.   Pulmonary/Chest: Effort normal and breath sounds normal. She has no wheezes. She has no rales.  Lymphadenopathy:    She has no cervical adenopathy.  Skin: Skin is warm and dry.          Assessment & Plan:  Acute Bacterial Sinusitis:  Symptoms present x 3 weeks. Experienced nausea and vomiting on Doxycycline that was prescribed in an e-visit. Side effect to Doxy updated to allergy list. Exam today suggestive of bacterial sinusitis. Treat with Augmentin course (tolerates Augmentin well). Continue nasal sprays, fluids, rest. Follow up PRN.  Sheral Flow, NP

## 2017-02-06 NOTE — Patient Instructions (Signed)
Start Augmentin antibiotics. Take 1 tablet by mouth twice daily for 10 days.  Please call me if you experience a reaction to this medication.  Ensure you are staying hydrated with water.  It was a pleasure meeting you!   Sinusitis, Adult Sinusitis is soreness and inflammation of your sinuses. Sinuses are hollow spaces in the bones around your face. Your sinuses are located:  Around your eyes.  In the middle of your forehead.  Behind your nose.  In your cheekbones. Your sinuses and nasal passages are lined with a stringy fluid (mucus). Mucus normally drains out of your sinuses. When your nasal tissues become inflamed or swollen, the mucus can become trapped or blocked so air cannot flow through your sinuses. This allows bacteria, viruses, and funguses to grow, which leads to infection. Sinusitis can develop quickly and last for 7?10 days (acute) or for more than 12 weeks (chronic). Sinusitis often develops after a cold. What are the causes? This condition is caused by anything that creates swelling in the sinuses or stops mucus from draining, including:  Allergies.  Asthma.  Bacterial or viral infection.  Abnormally shaped bones between the nasal passages.  Nasal growths that contain mucus (nasal polyps).  Narrow sinus openings.  Pollutants, such as chemicals or irritants in the air.  A foreign object stuck in the nose.  A fungal infection. This is rare. What increases the risk? The following factors may make you more likely to develop this condition:  Having allergies or asthma.  Having had a recent cold or respiratory tract infection.  Having structural deformities or blockages in your nose or sinuses.  Having a weak immune system.  Doing a lot of swimming or diving.  Overusing nasal sprays.  Smoking. What are the signs or symptoms? The main symptoms of this condition are pain and a feeling of pressure around the affected sinuses. Other symptoms  include:  Upper toothache.  Earache.  Headache.  Bad breath.  Decreased sense of smell and taste.  A cough that may get worse at night.  Fatigue.  Fever.  Thick drainage from your nose. The drainage is often green and it may contain pus (purulent).  Stuffy nose or congestion.  Postnasal drip. This is when extra mucus collects in the throat or back of the nose.  Swelling and warmth over the affected sinuses.  Sore throat.  Sensitivity to light. How is this diagnosed? This condition is diagnosed based on symptoms, a medical history, and a physical exam. To find out if your condition is acute or chronic, your health care provider may:  Look in your nose for signs of nasal polyps.  Tap over the affected sinus to check for signs of infection.  View the inside of your sinuses using an imaging device that has a light attached (endoscope). If your health care provider suspects that you have chronic sinusitis, you may also:  Be tested for allergies.  Have a sample of mucus taken from your nose (nasal culture) and checked for bacteria.  Have a mucus sample examined to see if your sinusitis is related to an allergy. If your sinusitis does not respond to treatment and it lasts longer than 8 weeks, you may have an MRI or CT scan to check your sinuses. These scans also help to determine how severe your infection is. In rare cases, a bone biopsy may be done to rule out more serious types of fungal sinus disease. How is this treated? Treatment for sinusitis depends on the  cause and whether your condition is chronic or acute. If a virus is causing your sinusitis, your symptoms will go away on their own within 10 days. You may be given medicines to relieve your symptoms, including:  Topical nasal decongestants. They shrink swollen nasal passages and let mucus drain from your sinuses.  Antihistamines. These drugs block inflammation that is triggered by allergies. This can help to ease  swelling in your nose and sinuses.  Topical nasal corticosteroids. These are nasal sprays that ease inflammation and swelling in your nose and sinuses.  Nasal saline washes. These rinses can help to get rid of thick mucus in your nose. If your condition is caused by bacteria, you will be given an antibiotic medicine. If your condition is caused by a fungus, you will be given an antifungal medicine. Surgery may be needed to correct underlying conditions, such as narrow nasal passages. Surgery may also be needed to remove polyps. Follow these instructions at home: Medicines  Take, use, or apply over-the-counter and prescription medicines only as told by your health care provider. These may include nasal sprays.  If you were prescribed an antibiotic medicine, take it as told by your health care provider. Do not stop taking the antibiotic even if you start to feel better. Hydrate and Humidify  Drink enough water to keep your urine clear or pale yellow. Staying hydrated will help to thin your mucus.  Use a cool mist humidifier to keep the humidity level in your home above 50%.  Inhale steam for 10-15 minutes, 3-4 times a day or as told by your health care provider. You can do this in the bathroom while a hot shower is running.  Limit your exposure to cool or dry air. Rest  Rest as much as possible.  Sleep with your head raised (elevated).  Make sure to get enough sleep each night. General instructions  Apply a warm, moist washcloth to your face 3-4 times a day or as told by your health care provider. This will help with discomfort.  Wash your hands often with soap and water to reduce your exposure to viruses and other germs. If soap and water are not available, use hand sanitizer.  Do not smoke. Avoid being around people who are smoking (secondhand smoke).  Keep all follow-up visits as told by your health care provider. This is important. Contact a health care provider if:  You  have a fever.  Your symptoms get worse.  Your symptoms do not improve within 10 days. Get help right away if:  You have a severe headache.  You have persistent vomiting.  You have pain or swelling around your face or eyes.  You have vision problems.  You develop confusion.  Your neck is stiff.  You have trouble breathing. This information is not intended to replace advice given to you by your health care provider. Make sure you discuss any questions you have with your health care provider. Document Released: 12/10/2005 Document Revised: 08/05/2016 Document Reviewed: 10/05/2015 Elsevier Interactive Patient Education  2017 [employer].

## 2017-02-06 NOTE — Progress Notes (Signed)
Pre visit review using our clinic review tool, if applicable. No additional management support is needed unless otherwise documented below in the visit note. 

## 2017-02-07 NOTE — Telephone Encounter (Signed)
Yes that's okay if it helps her symptoms. thanks

## 2017-02-07 NOTE — Telephone Encounter (Signed)
Are you ok with sending in [place]. I didn't see where you have Rx this in the past.

## 2017-02-08 LAB — TB SKIN TEST
INDURATION: 0 mm
TB SKIN TEST: NEGATIVE

## 2017-02-15 ENCOUNTER — Encounter: Payer: Self-pay | Admitting: Internal Medicine

## 2017-02-15 ENCOUNTER — Ambulatory Visit (INDEPENDENT_AMBULATORY_CARE_PROVIDER_SITE_OTHER): Payer: BC Managed Care – PPO | Admitting: Internal Medicine

## 2017-02-15 ENCOUNTER — Encounter: Payer: Self-pay | Admitting: Family Medicine

## 2017-02-15 VITALS — BP 138/92 | HR 80 | Temp 98.4°F | Wt 146.0 lb

## 2017-02-15 DIAGNOSIS — R030 Elevated blood-pressure reading, without diagnosis of hypertension: Secondary | ICD-10-CM | POA: Diagnosis not present

## 2017-02-15 MED ORDER — AMLODIPINE BESYLATE 5 MG PO TABS: 5.0000 mg | ORAL_TABLET | Freq: Every day | ORAL | 0 refills | Status: DC

## 2017-02-15 NOTE — Telephone Encounter (Signed)
Labs were normal in 11/2017 but you should probably have further evaluation of this and we may need to start a medication. Make an appt to be seen today with me or one of my partners, if we have nothing available I would consider [facility] urgent care today or the walk in clinic at [facility] on Saturday.  I will route this to our front office so they can help you make an appointment.

## 2017-02-15 NOTE — Progress Notes (Signed)
Subjective:    Patient ID: [name], female    DOB: 1976/12/08, 41 y.o.   MRN: [id number]  HPI  Pt presents to the clinic today, concerned with elevated blood pressure. She reports over the last 10 days, her BP has been running 155/104, 127/102, 136/102. She has been having intermittent headaches and chest tightness but denies chest pain or shortness of breath. She was on Florinef for low blood pressure, which she stopped 1 week ago when she noticed that her blood pressure was high. Her BP today was 137/92. She has had a stress test and heart cath in 2015. She does have a history of dysautonomia, managed by Dr. Doreatha Lew.   Review of Systems  Past Medical History:  Diagnosis Date  . Anginal pain ([place]) only on 09/22/2014  . Anxiety   . Asthma   . Chest pain    a. s/p intermediate risk nuclear stress test on 09/23/14 and normal LHC on 09/24/14   . Chronic nausea   . Complication of anesthesia   . Crohn disease ([place]) 2000  . Dysautonomia    recurrent syncope s/p ILR by Dr Doreatha Lew  . Endometriosis   . GERD (gastroesophageal reflux disease)   . H/O hiatal hernia   . Migraine   . Palpitations   . PONV (postoperative nausea and vomiting)   . Pulmonary embolism affecting pregnancy   . Vertigo     Current Outpatient Prescriptions  Medication Sig Dispense Refill  . acetaminophen (TYLENOL) 500 MG tablet Take 1,000 mg by mouth every 6 (six) hours as needed for headache.    . albuterol (PROVENTIL HFA;VENTOLIN HFA) 108 (90 BASE) MCG/ACT inhaler Inhale 2 puffs into the lungs every 6 (six) hours as needed for wheezing or shortness of breath. 1 Inhaler 2  . albuterol (PROVENTIL) (2.5 MG/3ML) 0.083% nebulizer solution Take 3 mLs (2.5 mg total) by nebulization every 6 (six) hours as needed for wheezing or shortness of breath. 150 mL 1  . ALPRAZolam (XANAX) 0.25 MG tablet Take 2 tablets (0.5 mg total) by mouth daily as needed for anxiety (Take as directed). 30 tablet 1  . amoxicillin-clavulanate  (AUGMENTIN) 875-125 MG tablet Take 1 tablet by mouth 2 (two) times daily. 20 tablet 0  . azaTHIOprine (IMURAN) 50 MG tablet TAKE 3 TABLETS BY MOUTH EVERY DAY AS DIRECTED. 90 tablet 6  . butalbital-acetaminophen-caffeine (FIORICET, ESGIC) 50-325-40 MG tablet TAKE 2 TABLETS EVERY 6 HOURS AS NEEDED 20 tablet 1  . Cetirizine HCl (ZYRTEC ALLERGY PO) Take 1 tablet by mouth daily.     . cyanocobalamin (,VITAMIN B-12,) 1000 MCG/ML injection Inject 1,000 mcg into the muscle every 21 ( twenty-one) days.    . cyclobenzaprine (FLEXERIL) 10 MG tablet Take 0.5-1 tablets (5-10 mg total) by mouth 3 (three) times daily as needed for muscle spasms. 30 tablet 0  . DEXILANT 60 MG capsule TAKE ONE (1) CAPSULE BY MOUTH EACH DAY 90 capsule 3  . dexlansoprazole (DEXILANT) 60 MG capsule Take 1 capsule (60 mg total) by mouth daily. 30 capsule 5  . fludrocortisone (FLORINEF) 0.1 MG tablet Take 1 tablet (0.1 mg total) by mouth 2 (two) times daily. Take at 7am and 2pm 60 tablet 3  . HYDROmorphone (DILAUDID) 2 MG tablet Take 1/2 to 1 tab every 4-6 hours as needed for pain. 20 tablet 0  . montelukast (SINGULAIR) 10 MG tablet Take 1 tablet (10 mg total) by mouth at bedtime. 30 tablet 3  . ranitidine (ZANTAC) 150 MG tablet Take  1 tablet (150 mg total) by mouth every morning. 30 tablet 3  . Vedolizumab (ENTYVIO IV) Inject into the vein as directed.    . zolpidem (AMBIEN) 10 MG tablet Take 1 tablet (10 mg total) by mouth at bedtime. 30 tablet 1   Current Facility-Administered Medications  Medication Dose Route Frequency Provider Last Rate Last Dose  . 0.9 %  sodium chloride infusion  500 mL Intravenous Continuous Manus Gunning, MD        Allergies  Allergen Reactions  . Biaxin [Clarithromycin] Other (See Comments)    Other reaction(s): Bleeding Cant take due to Chrons disease  . Codeine Nausea And Vomiting and Other (See Comments)    Other reaction(s): Bleeding Dilaudid and demerol OK  . Compazine  [Prochlorperazine Edisylate] Anaphylaxis  . Reglan [Metoclopramide] Anaphylaxis and Other (See Comments)    Other reaction(s): GI Upset (intolerance) Intensifies her Chron's  . Chlorhexidine Gluconate     Other reaction(s): Redness  . Doxycycline Nausea And Vomiting  . Other Rash    GuardIVa - PICC line antimicrobial dressing  Redness Resultant infection with PICC line x2 with irritation from adhesive "chloraprep"  . Oxycodone Nausea And Vomiting  . Sulfamethoxazole Rash and Other (See Comments)    Internal bleeding and kidney infection  . Sulfamethoxazole-Trimethoprim Other (See Comments)    "muscle twitches" Muscle spasms  . Chlorhexidine Other (See Comments)    Blisters after using a couple of times.   . Remicade [Infliximab] Other (See Comments)    Serum Sickness  Couldn't walk, open mouth, pain,   . Hydrocodone Nausea And Vomiting  . Macrobid [Nitrofurantoin] Nausea And Vomiting    Other reaction(s): GI Upset (intolerance)  . Phenergan [Promethazine Hcl] Other (See Comments)    Other reaction(s): Delusions (intolerance) Muscular seizures "muscle twitches"  . Promethazine Rash and Other (See Comments)    Muscular seizures  . Sulfa Antibiotics Hives and Other (See Comments)    Hematemesis; documented while a young child    Family History  Problem Relation Age of Onset  . Hypertension Mother   . Hyperlipidemia Mother   . Arthritis Mother   . Diabetes Father   . Coronary artery disease Father   . Heart disease Father 70    MI age 38  . Hyperlipidemia Brother   . Hypertension Brother   . Colon cancer Paternal Grandmother     Social History   Social History  . Marital status: Married    Spouse name: Jonni Sanger  . Number of children: 2  . Years of education: college   Occupational History  .  Other    Airline pilot  .  [place] History Main Topics  . Smoking status: Never Smoker  . Smokeless tobacco: Never Used  . Alcohol use Yes    . Drug use: No  . Sexual activity: Yes    Birth control/ protection: Surgical   Other Topics Concern  . Not on file   Social History Narrative   Patient lives at home with her husband Jonni Sanger). Patient works full time for [employer].    Education [employer].   Right handed.   Caffeine one cup of coffee daily.     Constitutional: Pt reports intermittent headaches. Denies fever, malaise, fatigue, or abrupt weight changes.  Respiratory: Denies difficulty breathing, shortness of breath, cough or sputum production.   Cardiovascular: Pt reports intermittent chest tightness. Denies chest pain, palpitations or swelling in the hands or feet.  Neurological: Denies  dizziness, difficulty with memory, difficulty with speech or problems with balance and coordination.  Psych: Denies anxiety, depression, SI/HI.  No other specific complaints in a complete review of systems (except as listed in HPI above).     Objective:   Physical Exam  BP (!) 138/92   Pulse 80   Temp 98.4 F (36.9 C) (Oral)   Wt 146 lb (66.2 kg)   SpO2 98%   BMI 25.46 kg/m  Wt Readings from Last 3 Encounters:  02/15/17 146 lb (66.2 kg)  02/06/17 148 lb (67.1 kg)  01/31/17 147 lb (66.7 kg)    General: Appears her stated age, in NAD. Cardiovascular: Normal rate and rhythm. S1,S2 noted.  No murmur, rubs or gallops noted. No JVD or BLE edema. No carotid bruits noted. Pulmonary/Chest: Normal effort and positive vesicular breath sounds. No respiratory distress. No wheezes, rales or ronchi noted.  Neurological: Alert and oriented. Psychiatric: She is mildly anxious appearing today.  BMET    Component Value Date/Time   NA 138 12/04/2016 1623   K 3.4 (L) 12/04/2016 1623   CL 106 12/04/2016 1623   CO2 26 12/04/2016 1623   GLUCOSE 101 (H) 12/04/2016 1623   BUN 15 12/04/2016 1623   CREATININE 0.73 12/04/2016 1623   CALCIUM 8.7 12/04/2016 1623   GFRNONAA >60 04/23/2016 0737   GFRAA >60 04/23/2016 0737     Lipid Panel     Component Value Date/Time   CHOL 186 09/23/2014 0405   TRIG 165 (H) 09/23/2014 0405   HDL 44 09/23/2014 0405   CHOLHDL 4.2 09/23/2014 0405   VLDL 33 09/23/2014 0405   LDLCALC 109 (H) 09/23/2014 0405    CBC    Component Value Date/Time   WBC 9.0 12/04/2016 1623   RBC 4.26 12/04/2016 1623   HGB 12.9 12/04/2016 1623   HCT 38.2 12/04/2016 1623   PLT 333.0 12/04/2016 1623   MCV 89.7 12/04/2016 1623   MCH 28.1 04/24/2016 0538   MCHC 33.7 12/04/2016 1623   RDW 14.8 12/04/2016 1623   LYMPHSABS 2.5 12/04/2016 1623   MONOABS 0.5 12/04/2016 1623   EOSABS 0.1 12/04/2016 1623   BASOSABS 0.0 12/04/2016 1623    Hgb A1C Lab Results  Component Value Date   HGBA1C 5.6 09/23/2014            Assessment & Plan:   Elevated blood pressure:  D/C Florinef Start Amlodipine 5 mg daily  Follow up with PCP in 2 weeks, sooner if needed Webb Silversmith, NP

## 2017-02-15 NOTE — Patient Instructions (Signed)
Hypertension Hypertension is another name for high blood pressure. High blood pressure forces your heart to work harder to pump blood. A blood pressure reading has two numbers, which includes a higher number over a lower number (example: 110/72). Follow these instructions at home:  Have your blood pressure rechecked by your doctor.  Only take medicine as told by your doctor. Follow the directions carefully. The medicine does not work as well if you skip doses. Skipping doses also puts you at risk for problems.  Do not smoke.  Monitor your blood pressure at home as told by your doctor. Contact a doctor if:  You think you are having a reaction to the medicine you are taking.  You have repeat headaches or feel dizzy.  You have puffiness (swelling) in your ankles.  You have trouble with your vision. Get help right away if:  You get a very bad headache and are confused.  You feel weak, numb, or faint.  You get chest or belly (abdominal) pain.  You throw up (vomit).  You cannot breathe very well. This information is not intended to replace advice given to you by your health care provider. Make sure you discuss any questions you have with your health care provider. Document Released: 05/28/2008 Document Revised: 05/17/2016 Document Reviewed: 10/02/2013 Elsevier Interactive Patient Education  2017 Elsevier Inc.  

## 2017-02-18 ENCOUNTER — Encounter: Payer: Self-pay | Admitting: Family Medicine

## 2017-02-19 ENCOUNTER — Encounter: Payer: Self-pay | Admitting: Gastroenterology

## 2017-02-19 ENCOUNTER — Ambulatory Visit (INDEPENDENT_AMBULATORY_CARE_PROVIDER_SITE_OTHER): Payer: BC Managed Care – PPO | Admitting: Gastroenterology

## 2017-02-19 VITALS — BP 124/80 | HR 72 | Ht 63.5 in | Wt 144.6 lb

## 2017-02-19 DIAGNOSIS — K50818 Crohn's disease of both small and large intestine with other complication: Secondary | ICD-10-CM

## 2017-02-19 DIAGNOSIS — K219 Gastro-esophageal reflux disease without esophagitis: Secondary | ICD-10-CM | POA: Diagnosis not present

## 2017-02-19 NOTE — Patient Instructions (Addendum)
If you are age 41 or older, your body mass index should be between 23-30. Your Body mass index is 25.21 kg/m. If this is out of the aforementioned range listed, please consider follow up with your Primary Care Provider.  If you are age 69 or younger, your body mass index should be between 19-25. Your Body mass index is 25.21 kg/m. If this is out of the aformentioned range listed, please consider follow up with your Primary Care Provider.   Your physician has requested that you go to the basement for the following lab work in 1 month:  CBC, CMET, Vitamin D.  You have been scheduled for a skin exam with Dr. Allyson Sabal on March 8th at 11:10am. You will need to arrive at 11:00am. If you need to reschedule please call their office at [phone].  Thank You.

## 2017-02-19 NOTE — Progress Notes (Signed)
HPI :  CROHNS HISTORY 41 y/o female with ileocolonic stricturing / perforating Crohn's disease  Ileocolonic Crohns - diagnosed in 2002. She reported she presented with a bowel obstruction with perforation and had surgery upon diagnosis and had 2 surgeries within the first year of diagnosis. She reports a history of abscess formation and fistula development (enteroenteral fistulas?). She was eventually placed on Remicade, only for a short period of time, perhaps 1-2 infusions - she was uncertain if it worked or not, but stopped it for unclear reasons. She had been on 6MP or steroids over time otherwise, she states she has intermittently been on 6MP. She has been on mesalamines in the past which did not help. She has been treated for bacterial overgrowth over time given ileocecectomy. She has had 2 surgeries total for her Crohns. She has been previously treated with Humira which appeared to put her disease into remission, however developed severe arthralgias, evaluated by Rheumatology who thought it was related to Humira, which was stopped and her symptoms resolved. She was then given a dose of Remicade and developed a serum sickness which led to hospital admission, treated with steroids.   She otherwise has a history of uveitis and iritis with Cronhns flares. She is seeing opthalmology in [facility] for this. She reports she also has significant back pain and arthralgias due to Crohns flares. She takes tramadol for pain, does not NSAIDs. Her pregnancy was complicated by PE x 2. Was on coumadin but now off all anticoagulants. She has been on IV iron in the past.   SINCE THE LAST VISIT:  Started on Entyvio for Crohn's disease June 2017 EGD and colon as below. Treated for SIBO with Rifaximin, did not tolerate thus given course of Flagyl but did not take it. Manometry and pH testing as below for symptoms of reflux and "gagging". Offered her trial of atrovent nasal spray for rhinitis, postnasal  drip, she did not take it yet.   He reports doing well on Entyvio and is tolerating it well.. She is also taking Imuran 137m daily which can cause some nausea. She reports anywhere from 3-5 BMs per day. She often has some urgency to use the bathroom after eating. No rectal bleeding. She reports pains generally well controlled. She states she had not taken flagyl as previously recommended for trial of SIBO treatment. She had the flu this season as well.   She has not seen dermatology in the past 1-2 years, overdue for skin exam. She is taking vitamin D. TB testing negative 01/2017.   She has not used the atrovent nasal spray. Singulair has helped. Heartburn is pretty well controlled on Dexilant and she likes how it works. We discussed long term regimen for reflux.   EGD 12/14/16 - 1cm hiatal hernia, otherwise normal exam, benign stomach polyps fundic gland Colonoscopy 12/14/16 - normal colon and ileum, few small ulcers at anastomosis - normal colon bx Esophageal manometry 12/2016 - normal 24 HR pH impedance testing - Demeester score of 4.1, 41 symptoms reported, symptom index of 12% for reflux   IBD Health Care Maintenance: Annual Flu Vaccine - 2017 UTD  Pneumococcal Vaccine if receiving immunosuppression: - 2016 Skin check / derm eval for those on biologic / thiopuriner, yearly: Date : DUE for this TB testing if on anti-TNF, yearly - 01/2017 Vitamin D screening -- due Last Colonoscopy - 11/2016    Past Medical History:  Diagnosis Date  . Anginal pain ([place] only on 09/22/2014  . Anxiety   .  Asthma   . Chest pain    a. s/p intermediate risk nuclear stress test on 09/23/14 and normal LHC on 09/24/14   . Chronic nausea   . Complication of anesthesia   . Crohn disease ([place]) 2000  . Dysautonomia    recurrent syncope s/p ILR by Dr Doreatha Lew  . Endometriosis   . GERD (gastroesophageal reflux disease)   . H/O hiatal hernia   . Migraine   . Palpitations   . PONV (postoperative nausea and  vomiting)   . Pulmonary embolism affecting pregnancy   . Vertigo      Past Surgical History:  Procedure Laterality Date  . [place] STUDY N/A 12/31/2016   Procedure: [place] STUDY;  Surgeon: Manus Gunning, MD;  Location: [facility] END[facility]COPY;  Service: Gastroenterology;  Laterality: N/A;  . APPENDECTOMY  ~ 1992  . CESAREAN SECTION  2011   emergency  CS due to placental abruption  . CESAREAN SECTION  2014   "preeclampsia"  . COLON SURGERY    . CYSTOSCOPY W/ STONE MANIPULATION  X 2  . ENDOSCOPIC RELEASE TRANSVERSE CARPAL LIGAMENT OF HAND Right 12/2010  . ESOPHAGEAL MANOMETRY N/A 12/31/2016   Procedure: ESOPHAGEAL MANOMETRY (EM);  Surgeon: Manus Gunning, MD;  Location: [facility] END[facility];  Service: Gastroenterology;  Laterality: N/A;  . ILEOCECETOMY  2002 X 2  . KNEE ARTHROSCOPY Bilateral 1988-1990  . LAPAROSCOPY FOR ECTOPIC PREGNANCY  11/2012  . LEFT HEART CATHETERIZATION WITH CORONARY ANGIOGRAM N/A 09/24/2014   Procedure: LEFT HEART CATHETERIZATION WITH CORONARY ANGIOGRAM;  Surgeon: Leonie Man, MD;  Location[facility] C[facility] LAB;  Service: Cardiovascular;  Laterality: N/A;  . LOOP RECORDER IMPLANT  11/2009   by DR Doreatha Lew  . NASAL SINUS SURGERY  ~ 2007  . PARTIAL COLECTOMY  2002   partial removed due to crohns  . TONSILLECTOMY  ~ 1996  . US ECHOCARDIOGRAPHY  11/11/2009   EF 55-60%   Family History  Problem Relation Age of Onset  . Hypertension Mother   . Hyperlipidemia Mother   . Arthritis Mother   . Diabetes Father   . Coronary artery disease Father   . Heart disease Father 31    MI age 31  . Hyperlipidemia Brother   . Hypertension Brother   . Colon cancer Paternal Grandmother    Social History  Substance Use Topics  . Smoking status: Never Smoker  . Smokeless tobacco: Never Used  . Alcohol use Yes   Current Outpatient Prescriptions  Medication Sig Dispense Refill  . acetaminophen (TYLENOL) 500 MG tablet Take 1,000 mg by mouth every 6 (six) hours as needed for  headache.    . albuterol (PROVENTIL HFA;VENTOLIN HFA) 108 (90 BASE) MCG/ACT inhaler Inhale 2 puffs into the lungs every 6 (six) hours as needed for wheezing or shortness of breath. 1 Inhaler 2  . albuterol (PROVENTIL) (2.5 MG/3ML) 0.083% nebulizer solution Take 3 mLs (2.5 mg total) by nebulization every 6 (six) hours as needed for wheezing or shortness of breath. 150 mL 1  . ALPRAZolam (XANAX) 0.25 MG tablet Take 2 tablets (0.5 mg total) by mouth daily as needed for anxiety (Take as directed). 30 tablet 1  . amLODipine (NORVASC) 5 MG tablet Take 1 tablet (5 mg total) by mouth daily. 30 tablet 0  . azaTHIOprine (IMURAN) 50 MG tablet TAKE 3 TABLETS BY MOUTH EVERY DAY AS DIRECTED. 90 tablet 6  . butalbital-acetaminophen-caffeine (FIORICET, ESGIC) 50-325-40 MG tablet TAKE 2 TABLETS EVERY 6 HOURS AS NEEDED 20  tablet 1  . Cetirizine HCl (ZYRTEC ALLERGY PO) Take 1 tablet by mouth daily.     . cyanocobalamin (,VITAMIN B-12,) 1000 MCG/ML injection Inject 1,000 mcg into the muscle every 21 ( twenty-one) days.    . cyclobenzaprine (FLEXERIL) 10 MG tablet Take 0.5-1 tablets (5-10 mg total) by mouth 3 (three) times daily as needed for muscle spasms. 30 tablet 0  . DEXILANT 60 MG capsule TAKE ONE (1) CAPSULE BY MOUTH EACH DAY 90 capsule 3  . dexlansoprazole (DEXILANT) 60 MG capsule Take 1 capsule (60 mg total) by mouth daily. 30 capsule 5  . fludrocortisone (FLORINEF) 0.1 MG tablet Take 1 tablet (0.1 mg total) by mouth 2 (two) times daily. Take at 7am and 2pm 60 tablet 3  . HYDROmorphone (DILAUDID) 2 MG tablet Take 1/2 to 1 tab every 4-6 hours as needed for pain. 20 tablet 0  . montelukast (SINGULAIR) 10 MG tablet Take 1 tablet (10 mg total) by mouth at bedtime. 30 tablet 3  . ranitidine (ZANTAC) 150 MG tablet Take 1 tablet (150 mg total) by mouth every morning. 30 tablet 3  . Vedolizumab (ENTYVIO IV) Inject into the vein as directed.    . zolpidem (AMBIEN) 10 MG tablet Take 1 tablet (10 mg total) by mouth at  bedtime. 30 tablet 1   Current Facility-Administered Medications  Medication Dose Route Frequency Provider Last Rate Last Dose  . 0.9 %  sodium chloride infusion  500 mL Intravenous Continuous Manus Gunning, MD       Allergies  Allergen Reactions  . Biaxin [Clarithromycin] Other (See Comments)    Other reaction(s): Bleeding Cant take due to Chrons disease  . Codeine Nausea And Vomiting and Other (See Comments)    Other reaction(s): Bleeding Dilaudid and demerol OK  . Compazine [Prochlorperazine Edisylate] Anaphylaxis  . Reglan [Metoclopramide] Anaphylaxis and Other (See Comments)    Other reaction(s): GI Upset (intolerance) Intensifies her Chron's  . Chlorhexidine Gluconate     Other reaction(s): Redness  . Doxycycline Nausea And Vomiting  . Other Rash    GuardIVa - PICC line antimicrobial dressing  Redness Resultant infection with PICC line x2 with irritation from adhesive "chloraprep"  . Oxycodone Nausea And Vomiting  . Sulfamethoxazole Rash and Other (See Comments)    Internal bleeding and kidney infection  . Sulfamethoxazole-Trimethoprim Other (See Comments)    "muscle twitches" Muscle spasms  . Chlorhexidine Other (See Comments)    Blisters after using a couple of times.   . Remicade [Infliximab] Other (See Comments)    Serum Sickness  Couldn't walk, open mouth, pain,   . Hydrocodone Nausea And Vomiting  . Macrobid [Nitrofurantoin] Nausea And Vomiting    Other reaction(s): GI Upset (intolerance)  . Phenergan [Promethazine Hcl] Other (See Comments)    Other reaction(s): Delusions (intolerance) Muscular seizures "muscle twitches"  . Promethazine Rash and Other (See Comments)    Muscular seizures  . Sulfa Antibiotics Hives and Other (See Comments)    Hematemesis; documented while a young child     Review of Systems: All systems reviewed and negative except where noted in HPI.   Lab Results  Component Value Date   WBC 9.0 12/04/2016   HGB 12.9  12/04/2016   HCT 38.2 12/04/2016   MCV 89.7 12/04/2016   PLT 333.0 12/04/2016    Lab Results  Component Value Date   CREATININE 0.73 12/04/2016   BUN 15 12/04/2016   NA 138 12/04/2016   K 3.4 (L) 12/04/2016  CL 106 12/04/2016   CO2 26 12/04/2016    Lab Results  Component Value Date   ALT 14 12/04/2016   AST 13 12/04/2016   ALKPHOS 55 12/04/2016   BILITOT 0.4 12/04/2016     Physical Exam: BP 124/80   Pulse 72   Ht 5' 3.5" (1.613 m)   Wt 144 lb 9.6 oz (65.6 kg)   LMP 02/07/2017 (Approximate)   BMI 25.21 kg/m  Constitutional: Pleasant,well-developed,female in no acute distress. HEENT: Normocephalic and atraumatic. Conjunctivae are normal. No scleral icterus. Neck supple.  Cardiovascular: Normal rate, regular rhythm.  Pulmonary/chest: Effort normal and breath sounds normal. No wheezing, rales or rhonchi. Abdominal: Soft, nondistended, nontender.  There are no masses palpable. No hepatomegaly. Extremities: no edema Lymphadenopathy: No cervical adenopathy noted. Neurological: Alert and oriented to person place and time. Skin: Skin is warm and dry. No rashes noted. Psychiatric: Normal mood and affect. Behavior is normal.   ASSESSMENT AND PLAN: 41 year old female with stricturing and penetrating ileocolonic Crohn's disease, history of uveitis, here for follow-up visit.   Crohn's disease - see extensive history as outlined above. Since I last saw in clinic we start her on Entyvio, she's had an excellent response to this regimen so far, her last colonoscopy was essentially normal in December and she is feeling generally pretty well. She is at risk for SIBO given ileocecectomy and have treated her in the past for this. Generally doing well right now on this regimen including Imuran 150 mg a day (2-2.5  mg/kg) which I would continue. She is due for skin exam by dermatology, also due for basic labs next month including vitamin D level. TB testing up-to-date. The other issue we  discussed today with the cost associated with her Entyvio infusion. We discussed potentially arranging for home IV infusion which may be much cheaper. I will discuss this with our staff to see if this is feasible. Otherwise follow-up in 6 months.  GERD - on high-dose Dexilant with good control of reflux on 24 hour pH testing. I suspect her previous symptoms were due to postnasal drip. I discussed long-term risks and benefits of PPIs with her and recommend lowest-dose dose of PPI needed to control symptoms or use for an alternative such as Zantac. Following this discussion she feels strongly that she wants to continue Dexilant at present dosing given she feels much better on it at present. I will continue to reassess this issue with her.  Fontanelle Cellar, MD [facility] Gastroenterology Pager [phone]

## 2017-02-21 ENCOUNTER — Emergency Department (HOSPITAL_COMMUNITY): Payer: BC Managed Care – PPO

## 2017-02-21 ENCOUNTER — Ambulatory Visit: Payer: BC Managed Care – PPO | Admitting: Family Medicine

## 2017-02-21 ENCOUNTER — Emergency Department (HOSPITAL_COMMUNITY)
Admission: EM | Admit: 2017-02-21 | Discharge: 2017-02-21 | Disposition: A | Discharge status: Home / Self Care | Payer: BC Managed Care – PPO | Attending: Emergency Medicine | Admitting: Emergency Medicine

## 2017-02-21 ENCOUNTER — Encounter (HOSPITAL_COMMUNITY): Payer: Self-pay

## 2017-02-21 DIAGNOSIS — Z79899 Other long term (current) drug therapy: Secondary | ICD-10-CM | POA: Insufficient documentation

## 2017-02-21 DIAGNOSIS — J45909 Unspecified asthma, uncomplicated: Secondary | ICD-10-CM | POA: Insufficient documentation

## 2017-02-21 DIAGNOSIS — I10 Essential (primary) hypertension: Secondary | ICD-10-CM | POA: Diagnosis not present

## 2017-02-21 DIAGNOSIS — R079 Chest pain, unspecified: Secondary | ICD-10-CM

## 2017-02-21 DIAGNOSIS — R0789 Other chest pain: Secondary | ICD-10-CM | POA: Insufficient documentation

## 2017-02-21 LAB — I-STAT TROPONIN, ED
Troponin i, poc: 0 ng/mL (ref 0.00–0.08)
Troponin i, poc: 0 ng/mL (ref 0.00–0.08)

## 2017-02-21 LAB — CBC
HEMATOCRIT: 42.3 % (ref 36.0–46.0)
HEMOGLOBIN: 14.3 g/dL (ref 12.0–15.0)
MCH: 30.4 pg (ref 26.0–34.0)
MCHC: 33.8 g/dL (ref 30.0–36.0)
MCV: 90 fL (ref 78.0–100.0)
Platelets: 357 10*3/uL (ref 150–400)
RBC: 4.7 MIL/uL (ref 3.87–5.11)
RDW: 13.7 % (ref 11.5–15.5)
WBC: 7.2 10*3/uL (ref 4.0–10.5)

## 2017-02-21 LAB — BASIC METABOLIC PANEL
ANION GAP: 7 (ref 5–15)
BUN: 11 mg/dL (ref 6–20)
CHLORIDE: 107 mmol/L (ref 101–111)
CO2: 25 mmol/L (ref 22–32)
Calcium: 9.1 mg/dL (ref 8.9–10.3)
Creatinine, Ser: 0.63 mg/dL (ref 0.44–1.00)
GFR calc non Af Amer: >60 mL/min (ref >60)
GLUCOSE: 95 mg/dL (ref 65–99)
Potassium: 4.2 mmol/L (ref 3.5–5.1)
Sodium: 139 mmol/L (ref 135–145)

## 2017-02-21 LAB — D-DIMER, QUANTITATIVE (NOT AT ARMC)

## 2017-02-21 NOTE — ED Notes (Signed)
Got patient into a gown did ekg shown to Dr Tomi Bamberger

## 2017-02-21 NOTE — ED Triage Notes (Addendum)
Pt presents via [facility] for evaluation of near syncopal event today at work. Pt reports elevation in BP when standing. States has had R neck pain x 2 weeks and R CP intermittently x 1 week. States pain in chest and neck worsen with standing. Recently placed on norvasc x 1 week. Pt AxO x4. Pt received 324 ASA and 1 Nitro.

## 2017-02-21 NOTE — ED Provider Notes (Signed)
[facility] DEPT Provider Note   CSN: [id number] Arrival date & time: 02/21/17  1057     History   Chief Complaint Chief Complaint  Patient presents with  . Chest Pain  . Near Syncope    HPI [name] is a 41 y.o. female.  HPI Pt was at work today.  She started experiencing chest tightness and pain on the right side.  She also felt her heart pumping excessively.  Her face started tingling.  She felt like she was going to pass out.  She went to the office.  They checked her BP.  It was 158/118.  911 was called.  She was recently started on norvasc for HTN.  She has been having issues with neck pain with her elevated BP.  No leg swelling.  No history of heart disease.  She does have history of PE during pregnancy.    Past Medical History:  Diagnosis Date  . Anginal pain ([place]) only on 09/22/2014  . Anxiety   . Asthma   . Chest pain    a. s/p intermediate risk nuclear stress test on 09/23/14 and normal LHC on 09/24/14   . Chronic nausea   . Complication of anesthesia   . Crohn disease ([place]) 2000  . Dysautonomia    recurrent syncope s/p ILR by Dr Doreatha Lew  . Endometriosis   . GERD (gastroesophageal reflux disease)   . H/O hiatal hernia   . Migraine   . Palpitations   . PONV (postoperative nausea and vomiting)   . Pulmonary embolism affecting pregnancy   . Vertigo     Patient Active Problem List   Diagnosis Date Noted  . Immunosuppressed status ([place]) 03/27/2016  . High risk medication use 07/19/2015  . Long-term (current) use of anticoagulants 07/19/2015  . Depression, major, in remission ([place]) 06/10/2015  . History of hyperemesis gravidarum 11/08/2014  . Dysautonomia   . Palpitations   . HLD (hyperlipidemia) 09/23/2014  . Elevated LFTs 10/21/2012  . Generalized anxiety disorder 10/06/2012  . BPPV (benign paroxysmal positional vertigo) 03/15/2012  . Crohn's disease ([place]) 07/27/2011  . Reactive arthritis due to crohn's flares 07/27/2011  . Asthma, moderate  persistent 07/27/2011  . Gastroesophageal reflux disease without esophagitis 07/27/2011  . Allergic rhinitis 07/27/2011  . Endometriosis 07/27/2011  . Migraine 07/27/2011  . Calcium nephrolithiasis 07/27/2011    Past Surgical History:  Procedure Laterality Date  . [place] STUDY N/A 12/31/2016   Procedure: [place] STUDY;  Surgeon: Manus Gunning, MD;  Location: [facility];  Service: Gastroenterology;  Laterality: N/A;  . APPENDECTOMY  ~ 1992  . CESAREAN SECTION  2011   emergency  CS due to placental abruption  . CESAREAN SECTION  2014   "preeclampsia"  . COLON SURGERY    . CYSTOSCOPY W/ STONE MANIPULATION  X 2  . ENDOSCOPIC RELEASE TRANSVERSE CARPAL LIGAMENT OF HAND Right 12/2010  . ESOPHAGEAL MANOMETRY N/A 12/31/2016   Procedure: ESOPHAGEAL MANOMETRY (EM);  Surgeon: Manus Gunning, MD;  Location: [facility];  Service: Gastroenterology;  Laterality: N/A;  . ILEOCECETOMY  2002 X 2  . KNEE ARTHROSCOPY Bilateral 1988-1990  . LAPAROSCOPY FOR ECTOPIC PREGNANCY  11/2012  . LEFT HEART CATHETERIZATION WITH CORONARY ANGIOGRAM N/A 09/24/2014   Procedure: LEFT HEART CATHETERIZATION WITH CORONARY ANGIOGRAM;  Surgeon: Leonie Man, MD;  Location: [facility] CATH LAB;  Service: Cardiovascular;  Laterality: N/A;  . LOOP RECORDER IMPLANT  11/2009   by DR Doreatha Lew  . NASAL SINUS SURGERY  ~  2007  . PARTIAL COLECTOMY  2002   partial removed due to crohns  . TONSILLECTOMY  ~ 1996  . US ECHOCARDIOGRAPHY  11/11/2009   EF 55-60%    OB History    Gravida Para Term Preterm AB Living   4 2 0 2 1 2    SAB TAB Ectopic Multiple Live Births   0 0 1 0 2       Home Medications    Prior to Admission medications   Medication Sig Start Date End Date Taking? Authorizing Provider  acetaminophen (TYLENOL) 500 MG tablet Take 1,000 mg by mouth every 6 (six) hours as needed for headache.    Historical Provider, MD  albuterol (PROVENTIL HFA;VENTOLIN HFA) 108 (90 BASE) MCG/ACT inhaler Inhale 2  puffs into the lungs every 6 (six) hours as needed for wheezing or shortness of breath. 07/25/15   Jearld Fenton, NP  albuterol (PROVENTIL) (2.5 MG/3ML) 0.083% nebulizer solution Take 3 mLs (2.5 mg total) by nebulization every 6 (six) hours as needed for wheezing or shortness of breath. 11/14/16   Elby Beck, FNP  ALPRAZolam Duanne Moron) 0.25 MG tablet Take 2 tablets (0.5 mg total) by mouth daily as needed for anxiety (Take as directed). 10/30/16   Amy Cletis Athens, MD  amLODipine (NORVASC) 5 MG tablet Take 1 tablet (5 mg total) by mouth daily. 02/15/17   Jearld Fenton, NP  azaTHIOprine (IMURAN) 50 MG tablet TAKE 3 TABLETS BY MOUTH EVERY DAY AS DIRECTED. 10/09/16   Manus Gunning, MD  butalbital-acetaminophen-caffeine (FIORICET, ESGIC) [phone] MG tablet TAKE 2 TABLETS EVERY 6 HOURS AS NEEDED 11/08/16   Amy Cletis [place], MD  Cetirizine HCl (ZYRTEC ALLERGY PO) Take 1 tablet by mouth daily.     Historical Provider, MD  cyanocobalamin (,VITAMIN B-12,) 1000 MCG/ML injection Inject 1,000 mcg into the muscle every 21 ( twenty-one) days.    Historical Provider, MD  cyclobenzaprine (FLEXERIL) 10 MG tablet Take 0.5-1 tablets (5-10 mg total) by mouth 3 (three) times daily as needed for muscle spasms. 04/24/16   Maryann Mikhail, DO  DEXILANT 60 MG capsule TAKE ONE (1) CAPSULE BY MOUTH EACH DAY 02/07/17   Manus Gunning, MD  dexlansoprazole (DEXILANT) 60 MG capsule Take 1 capsule (60 mg total) by mouth daily. 09/08/15   Amy Cletis [place], MD  fludrocortisone (FLORINEF) 0.1 MG tablet Take 1 tablet (0.1 mg total) by mouth 2 (two) times daily. Take at 7am and 2pm 09/11/16   Evans Lance, MD  HYDROmorphone (DILAUDID) 2 MG tablet Take 1/2 to 1 tab every 4-6 hours as needed for pain. 09/14/16 03/10/17  Tonia Ghent, MD  montelukast (SINGULAIR) 10 MG tablet Take 1 tablet (10 mg total) by mouth at bedtime. 01/31/17   Amy Cletis [place], MD  ranitidine (ZANTAC) 150 MG tablet Take 1 tablet (150 mg total) by mouth every  morning. 01/17/16   Manus Gunning, MD  Vedolizumab (ENTYVIO IV) Inject into the vein as directed.    Historical Provider, MD  zolpidem (AMBIEN) 10 MG tablet Take 1 tablet (10 mg total) by mouth at bedtime. 01/14/17   Owens Loffler, MD    Family History Family History  Problem Relation Age of Onset  . Hypertension Mother   . Hyperlipidemia Mother   . Arthritis Mother   . Diabetes Father   . Coronary artery disease Father   . Heart disease Father 56    MI age 67  . Hyperlipidemia Brother   . Hypertension Brother   .  Colon cancer Paternal Grandmother     Social History Social History  Substance Use Topics  . Smoking status: Never Smoker  . Smokeless tobacco: Never Used  . Alcohol use Yes     Allergies   Biaxin [clarithromycin]; Codeine; Compazine [prochlorperazine edisylate]; Reglan [metoclopramide]; Chlorhexidine gluconate; Doxycycline; Other; Oxycodone; Sulfamethoxazole; Sulfamethoxazole-trimethoprim; Chlorhexidine; Remicade [infliximab]; Hydrocodone; Macrobid [nitrofurantoin]; Phenergan [promethazine hcl]; Promethazine; and Sulfa antibiotics   Review of Systems Review of Systems  All other systems reviewed and are negative.    Physical Exam Updated Vital Signs BP 144/99 (BP Location: Left Arm)   Pulse 99   Temp 98.5 F (36.9 C) (Oral)   Resp 20   Ht 5' 5"  (1.651 m)   Wt 65.8 kg   LMP 02/07/2017 (Approximate)   SpO2 100%   BMI 24.13 kg/m   Physical Exam  Constitutional: She appears well-developed and well-nourished. No distress.  HENT:  Head: Normocephalic and atraumatic.  Right Ear: External ear normal.  Left Ear: External ear normal.  Eyes: Conjunctivae are normal. Right eye exhibits no discharge. Left eye exhibits no discharge. No scleral icterus.  Neck: Neck supple. No tracheal deviation present.  Cardiovascular: Normal rate, regular rhythm and intact distal pulses.   Pulmonary/Chest: Effort normal and breath sounds normal. No stridor. No  respiratory distress. She has no wheezes. She has no rales.  Abdominal: Soft. Bowel sounds are normal. She exhibits no distension. There is no tenderness. There is no rebound and no guarding.  Musculoskeletal: She exhibits no edema or tenderness.  Neurological: She is alert. She has normal strength. No cranial nerve deficit (no facial droop, extraocular movements intact, no slurred speech) or sensory deficit. She exhibits normal muscle tone. She displays no seizure activity. Coordination normal.  Skin: Skin is warm and dry. No rash noted.  Psychiatric: She has a normal mood and affect.  Nursing note and vitals reviewed.    ED Treatments / Results  Labs (all labs ordered are listed, but only abnormal results are displayed) Labs Reviewed  CBC  BASIC METABOLIC PANEL  D-DIMER, QUANTITATIVE (NOT AT [facility])  Randolm Idol, ED  Randolm Idol, ED    EKG  EKG Interpretation  Date/Time:  Thursday February 21 2017 11:02:24 EST Ventricular Rate:  89 PR Interval:    QRS Duration: 82 QT Interval:  401 QTC Calculation: 488 R Axis:   79 Text Interpretation:  Sinus rhythm Borderline T wave abnormalities , new since last tracing Borderline prolonged QT interval Confirmed by Jibreel Fedewa  MD-J, Yuritzy Zehring ([id number]) on 02/21/2017 11:14:32 AM       Radiology Dg Chest 2 View  Result Date: 02/21/2017 CLINICAL DATA:  Seven onset of dizziness, hypertension, and chest and neck pain. History of asthma, pulmonary embolism, began antihypertensive medication last week. EXAM: CHEST  2 VIEW COMPARISON:  Portable chest x-ray of April 18, 2016 FINDINGS: The lungs are well-expanded. There is no focal infiltrate. There is no pleural effusion or pneumothorax. The heart and pulmonary vascularity are normal. The mediastinum is normal in width. The bony thorax exhibits no acute abnormality. A cardiac rhythm loop recorder is present on the left. IMPRESSION: There is no active cardiopulmonary disease. Electronically Signed   By: David   [place] M.D.   On: 02/21/2017 12:16    Procedures Procedures (including critical care time)  Medications Ordered in ED Medications - No data to display   Initial Impression / Assessment and Plan / ED Course  I have reviewed the triage vital signs and the nursing notes.  Pertinent  labs & imaging results that were available during my care of the patient were reviewed by me and considered in my medical decision making (see chart for details).  Clinical Course as of Feb 22 1603  Thu Feb 21, 2017  1115 Underwent LHC on 09/24/14 which revealed:   Angiographically normal coronary arteries with a right dominant system.   No evidence of any culprit lesion to explain the abnormal Myoview. This would be considered a false positive Myoview stress test likely related to breast attenuation.   Normal LV function with normal LVEDP  [JK]    Clinical Course User Index [JK] Dorie Rank, MD    Pt is low risk for ACS.  She also has a history of normal coronary anatomy on a heart cath in 2015.  Pt presented with right sided chest pain, near syncope htn.  Sx improved in the ED.  Doubt aortic dissection.   D dimer negative.  Doubt PE.  Serial troponins negative.  Doubt ACS.  Etiology of her labile htn unclear. However  At this time there does not appear to be any evidence of an acute emergency medical condition and the patient appears stable for discharge with appropriate outpatient follow up.   Final Clinical Impressions(s) / ED Diagnoses   Final diagnoses:  Chest pain, unspecified type  Hypertension, unspecified type    New Prescriptions New Prescriptions   No medications on file     Dorie Rank, MD 02/21/17 1607

## 2017-02-21 NOTE — Discharge Instructions (Signed)
Follow-up with your cardiologist tomorrow as planned. Continue your current medications.

## 2017-02-22 ENCOUNTER — Ambulatory Visit (INDEPENDENT_AMBULATORY_CARE_PROVIDER_SITE_OTHER): Payer: BC Managed Care – PPO | Admitting: Physician Assistant

## 2017-02-22 ENCOUNTER — Encounter: Payer: Self-pay | Admitting: Physician Assistant

## 2017-02-22 VITALS — Ht 64.5 in | Wt 142.4 lb

## 2017-02-22 DIAGNOSIS — G909 Disorder of the autonomic nervous system, unspecified: Secondary | ICD-10-CM

## 2017-02-22 DIAGNOSIS — R079 Chest pain, unspecified: Secondary | ICD-10-CM

## 2017-02-22 DIAGNOSIS — R002 Palpitations: Secondary | ICD-10-CM

## 2017-02-22 DIAGNOSIS — I10 Essential (primary) hypertension: Secondary | ICD-10-CM

## 2017-02-22 DIAGNOSIS — G901 Familial dysautonomia [Riley-Day]: Secondary | ICD-10-CM

## 2017-02-22 MED ORDER — METOPROLOL TARTRATE 25 MG PO TABS: 25.0000 mg | ORAL_TABLET | Freq: Two times a day (BID) | ORAL | 3 refills | Status: DC

## 2017-02-22 NOTE — Patient Instructions (Addendum)
Medication Instructions:  1. STOP NORVASC 2. START METOPROLOL TARTRATE 25 MG TWICE DAILY; YOU WILL START THIS TONIGHT 02/22/17   Labwork: 1. TODAY TSH, FREE T4  Testing/Procedures: 1. Your physician has requested that you have an echocardiogram. Echocardiography is a painless test that uses sound waves to create images of your heart. It provides your doctor with information about the size and shape of your heart and how well your heart's chambers and valves are working. This procedure takes approximately one hour. There are no restrictions for this procedure.  2. Your physician has requested that you have an exercise tolerance test. For further information please visit [email or web address]. Please also follow instruction sheet, as given.  Follow-Up: DR. Lovena Le IN 3-4 WEEKS   Any Other Special Instructions Will Be Listed Below (If Applicable).  If you need a refill on your cardiac medications before your next appointment, please call your pharmacy.

## 2017-02-22 NOTE — Progress Notes (Signed)
Cardiology Office Note:    Date:  02/22/2017   ID:  [name], DOB [birth year], MRN [id number]  PCP:  Eliezer Lofts, MD  Cardiologist/Electrophysiologist:  Dr. Cristopher Peru   Referring MD: Jinny Sanders, MD   Chief Complaint  Patient presents with  . Hospitalization Follow-up    ED visit for chest pain    History of Present Illness:    [name] is a 41 y.o. female with a hx of autonomic dysfunction, s/p ILR for recurrent syncope, Crohn's disease, hyperemesis gravidarum with prior pregnancies, prior pulmonary embolism during pregnancy.  Last seen by Dr. Cristopher Peru in 9/17.  Florinef was resumed due to resurgent symptoms of near syncope in the setting of autonomic dysfunction.    She went to the ED on 02/21/17 with chest pain and markedly elevated BP.  POC Troponin was neg x 2, DDimer was neg, Hgb 14.3, SCr 0.63, CXR was unremarkable; EKG demonstrated NSR with NSSTTW changes and QTc 488 ms.  She returns for further evaluation. Over the past couple of months, she's started to have elevated blood pressures and rapid palpitations. This always occurs with going from a lying position to sitting or standing. She has associated right-sided chest discomfort and right neck discomfort. Her symptoms resolve with lying down flat. She denies significant dyspnea. She denies exertional discomfort aside from what she experiences with her rapid heart rate. She denies syncope but has noted near-syncope at times. She denies orthopnea, PND or edema. She denies fevers, chills, cough, vomiting, melena, hematochezia, hematuria. She has chronic diarrhea from Crohn's. Essentially, her Crohn's is in remission. She has noted a 14 pound weight loss. She was taken to the hospital by EMS. She tells me that the paramedics tried to do orthostatics. Her blood pressure continued to spike every time they sat her up.  Prior CV studies:   The following studies were reviewed today:   Event Monitor 10/15 NSR, Sinus  Tachy  LHC (10/15):   EF 55-60%, normal coronary arteries  Myoview 10/15 IMPRESSION: 1. Intermediate risk study with small size, moderate severity reversible defect in mid LAD territory. The interpretation of this study might be affected by significant extracardiac activity, however the presence of transient ischemic dilatation increases the suspicion for possible ischemia. 2.  Hyperdynamic LVEF > 70%, no regional wall motion abnormalities.  ETT-Echo (11/10):   normal  Past Medical History:  Diagnosis Date  . Anxiety   . Asthma   . Chest pain    a. s/p intermediate risk nuclear stress test on 09/23/14 and normal LHC on 09/24/14   . Chronic nausea   . Crohn disease ([place]) 2000  . Dysautonomia    recurrent syncope s/p ILR by Dr Doreatha Lew  . Endometriosis   . GERD (gastroesophageal reflux disease)   . H/O hiatal hernia   . Migraine   . Palpitations   . Pulmonary embolism affecting pregnancy   . Vertigo     Past Surgical History:  Procedure Laterality Date  . [place] STUDY N/A 12/31/2016   Procedure: [place] STUDY;  Surgeon: Manus Gunning, MD;  Location: [facility];  Service: Gastroenterology;  Laterality: N/A;  . APPENDECTOMY  ~ 1992  . CESAREAN SECTION  2011   emergency  CS due to placental abruption  . CESAREAN SECTION  2014   "preeclampsia"  . COLON SURGERY    . CYSTOSCOPY W/ STONE MANIPULATION  X 2  . ENDOSCOPIC RELEASE TRANSVERSE CARPAL LIGAMENT OF HAND Right  12/2010  . ESOPHAGEAL MANOMETRY N/A 12/31/2016   Procedure: ESOPHAGEAL MANOMETRY (EM);  Surgeon: Manus Gunning, MD;  Location: [facility];  Service: Gastroenterology;  Laterality: N/A;  . ILEOCECETOMY  2002 X 2  . KNEE ARTHROSCOPY Bilateral 1988-1990  . LAPAROSCOPY FOR ECTOPIC PREGNANCY  11/2012  . LEFT HEART CATHETERIZATION WITH CORONARY ANGIOGRAM N/A 09/24/2014   Procedure: LEFT HEART CATHETERIZATION WITH CORONARY ANGIOGRAM;  Surgeon: Leonie Man, MD;  [facility]: [facility]ATH LAB;   Service: Cardiovascular;  Laterality: N/A;  . LOOP RECORDER IMPLANT  11/2009   by DR Doreatha Lew  . NASAL SINUS SURGERY  ~ 2007  . PARTIAL COLECTOMY  2002   partial removed due to crohns  . TONSILLECTOMY  ~ 1996  . US ECHOCARDIOGRAPHY  11/11/2009   EF 55-60%    Current Medications: Current Meds  Medication Sig  . acetaminophen (TYLENOL) 500 MG tablet Take 1,000 mg by mouth every 6 (six) hours as needed for headache.  . albuterol (PROVENTIL HFA;VENTOLIN HFA) 108 (90 BASE) MCG/ACT inhaler Inhale 2 puffs into the lungs every 6 (six) hours as needed for wheezing or shortness of breath.  Marland Kitchen albuterol (PROVENTIL) (2.5 MG/3ML) 0.083% nebulizer solution Take 3 mLs (2.5 mg total) by nebulization every 6 (six) hours as needed for wheezing or shortness of breath.  . ALPRAZolam (XANAX) 0.25 MG tablet Take 2 tablets (0.5 mg total) by mouth daily as needed for anxiety (Take as directed).  Marland Kitchen azaTHIOprine (IMURAN) 50 MG tablet TAKE 3 TABLETS BY MOUTH EVERY DAY AS DIRECTED.  Marland Kitchen butalbital-acetaminophen-caffeine (FIORICET, ESGIC) 50-325-40 MG tablet TAKE 2 TABLETS EVERY 6 HOURS AS NEEDED  . Cetirizine HCl (ZYRTEC ALLERGY PO) Take 1 tablet by mouth daily.   . cyanocobalamin (,VITAMIN B-12,) 1000 MCG/ML injection Inject 1,000 mcg into the muscle every 21 ( twenty-one) days.  . cyclobenzaprine (FLEXERIL) 10 MG tablet Take 0.5-1 tablets (5-10 mg total) by mouth 3 (three) times daily as needed for muscle spasms.  Marland Kitchen dexlansoprazole (DEXILANT) 60 MG capsule Take 1 capsule (60 mg total) by mouth daily.  Marland Kitchen HYDROmorphone (DILAUDID) 2 MG tablet Take 1/2 to 1 tab every 4-6 hours as needed for pain.  . metoprolol tartrate (LOPRESSOR) 25 MG tablet Take 1 tablet (25 mg total) by mouth 2 (two) times daily.  . montelukast (SINGULAIR) 10 MG tablet Take 1 tablet (10 mg total) by mouth at bedtime.  . ranitidine (ZANTAC) 150 MG tablet Take 1 tablet (150 mg total) by mouth every morning.  . Vedolizumab (ENTYVIO IV) Inject into the  vein as directed.  . zolpidem (AMBIEN) 10 MG tablet Take 1 tablet (10 mg total) by mouth at bedtime.  . [DISCONTINUED] amLODipine (NORVASC) 5 MG tablet Take 1 tablet (5 mg total) by mouth daily.  . [DISCONTINUED] fludrocortisone (FLORINEF) 0.1 MG tablet Take 1 tablet (0.1 mg total) by mouth 2 (two) times daily. Take at 7am and 2pm   Current Facility-Administered Medications for the 02/22/17 encounter (Office Visit) with Liliane Shi, PA-C  Medication  . 0.9 %  sodium chloride infusion     Allergies:   Biaxin [clarithromycin]; Codeine; Compazine [prochlorperazine edisylate]; Reglan [metoclopramide]; Chlorhexidine gluconate; Doxycycline; Other; Oxycodone; Sulfamethoxazole; Sulfamethoxazole-trimethoprim; Chlorhexidine; Remicade [infliximab]; Hydrocodone; Macrobid [nitrofurantoin]; Phenergan [promethazine hcl]; Promethazine; and Sulfa antibiotics   Social History   Social History  . Marital status: Married    Spouse name: Jonni Sanger  . Number of children: 2  . Years of education: college   Occupational History  .  Other    circulumn  Mudlogger  .  [employer] History Main Topics  . Smoking status: Never Smoker  . Smokeless tobacco: Never Used  . Alcohol use Yes  . Drug use: No  . Sexual activity: Yes    Birth control/ protection: Surgical   Other Topics Concern  . None   Social History Narrative   Patient lives at home with her husband Jonni Sanger). Patient works full time for [employer].    Education [employer].   Right handed.   Caffeine one cup of coffee daily.     Family History  Problem Relation Age of Onset  . Hypertension Mother   . Hyperlipidemia Mother   . Arthritis Mother   . Diabetes Father   . Coronary artery disease Father   . Heart disease Father 39    MI age 76  . Hyperlipidemia Brother   . Hypertension Brother   . Colon cancer Paternal Grandmother      ROS:   Please see the history of present illness.    Review of Systems   Cardiovascular: Positive for chest pain and dyspnea on exertion.  Musculoskeletal: Positive for joint pain.  Neurological: Positive for dizziness and headaches.   All other systems reviewed and are negative.   EKGs/Labs/Other Test Reviewed:    EKG:  EKG is not ordered today.  The ekg ordered today demonstrates n/a  Recent Labs: 05/15/2016: Magnesium 1.7 12/04/2016: ALT 14 02/21/2017: BUN 11; Creatinine, Ser 0.63; Hemoglobin 14.3; Platelets 357; Potassium 4.2; Sodium 139   Recent Lipid Panel    Component Value Date/Time   CHOL 186 09/23/2014 0405   TRIG 165 (H) 09/23/2014 0405   HDL 44 09/23/2014 0405   CHOLHDL 4.2 09/23/2014 0405   VLDL 33 09/23/2014 0405   LDLCALC 109 (H) 09/23/2014 0405     Physical Exam:    VS:  Ht 5' 4.5" (1.638 m)   Wt 142 lb 6.4 oz (64.6 kg)   LMP 02/07/2017 (Approximate)   SpO2 98%   BMI 24.07 kg/m     Orthostatic VS for the past 24 hrs (Last 3 readings):  BP- Lying Pulse- Lying BP- Sitting Pulse- Sitting BP- Standing at 0 minutes Pulse- Standing at 0 minutes BP- Standing at 3 minutes Pulse- Standing at 3 minutes  02/22/17 1325 122/83 85 128/87 94 117/88 109 123/88 106   Wt Readings from Last 3 Encounters:  02/22/17 142 lb 6.4 oz (64.6 kg)  02/21/17 145 lb (65.8 kg)  02/19/17 144 lb 9.6 oz (65.6 kg)     Physical Exam  Constitutional: She is oriented to person, place, and time. She appears well-developed and well-nourished. No distress.  HENT:  Head: Normocephalic and atraumatic.  Eyes: No scleral icterus.  Neck: Normal range of motion. No JVD present. Carotid bruit is not present. No thyromegaly present.  Cardiovascular: Normal rate, regular rhythm, S1 normal, S2 normal and normal heart sounds.   No murmur heard. Pulmonary/Chest: Breath sounds normal. She has no wheezes. She has no rhonchi. She has no rales.  Abdominal: Soft. There is no tenderness.  Musculoskeletal: She exhibits no edema.  Neurological: She is alert and oriented to  person, place, and time.  Skin: Skin is warm and dry.  Psychiatric: She has a normal mood and affect.    ASSESSMENT:    1. Chest pain, unspecified type   2. Essential hypertension   3. Palpitations    PLAN:    In order of problems listed above:  1. Chest pain -  She has atypical chest pain that seems to be related to rapid palpitations with assoc hypertension.  Overall, I am concerned that she is hyperthyroid.  She has lost weight without trying.  She has previously had low BP and orthostatic intolerance requiring Florinef.  Now she is off of Florinef and requiring BP medication with Norvasc.  Therefore, secondary causes for HTN must be considered.  She had a normal cath in 2015.  It is doubtful that she would have developed significant CAD in that amount of time.  I think a GXT would be helpful to document her BP and HR with exercise.    -  Schedule GXT - will do on beta-blocker therapy (see below).  -  Schedule echocardiogram   2. HTN - As noted, will need to assess for secondary HTN.  She has normal electrolytes and renal function on recent BMET.  Will obtain TSH, Free T4 today.  If workup completely normal, consider 24 hour urine for catecholamines and metanephrines.  DC Amlodipine.  Start Metoprolol tartrate 25 mg Twice daily.  3. Palpitations - I suspect she is having sinus tachycardia.  At this point, I do not think that she needs a heart monitor. Obtain TSH and FT4 as noted.  Start beta-blocker therapy.    4. Autonomic dysfunction - Orthostatic VS today show normal BP with standing but her HR increases > 20 bpm.  Hopefully, beta-blocker therapy will help blunt her HR response and help her symptoms.   Medication Adjustments/Labs and Tests Ordered: Current medicines are reviewed at length with the patient today.  Concerns regarding medicines are outlined above.  Medication changes, Labs and Tests ordered today are outlined in the Patient Instructions noted below. Patient Instructions   Medication Instructions:  1. STOP NORVASC 2. START METOPROLOL TARTRATE 25 MG TWICE DAILY; YOU WILL START THIS TONIGHT 02/22/17   Labwork: 1. TODAY TSH, FREE T4  Testing/Procedures: 1. Your physician has requested that you have an echocardiogram. Echocardiography is a painless test that uses sound waves to create images of your heart. It provides your doctor with information about the size and shape of your heart and how well your heart's chambers and valves are working. This procedure takes approximately one hour. There are no restrictions for this procedure.  2. Your physician has requested that you have an exercise tolerance test. For further information please visit [email or web address]. Please also follow instruction sheet, as given.  Follow-Up: DR. Lovena Le IN 3-4 WEEKS   Any Other Special Instructions Will Be Listed Below (If Applicable).  If you need a refill on your cardiac medications before your next appointment, please call your pharmacy.   Return in about 4 weeks (around 03/22/2017) for Follow up after testing with Dr. Lovena Le.   Signed, Richardson Dopp, PA-C  02/22/2017 1:29 PM    [facility] [place] [place], [place], [place]  [address] Phone: [phone]; Fax: [phone]

## 2017-02-23 LAB — TSH: TSH: 1.63 u[IU]/mL (ref 0.450–4.500)

## 2017-02-23 LAB — T4, FREE: Free T4: 1.51 ng/dL (ref 0.82–1.77)

## 2017-02-25 ENCOUNTER — Other Ambulatory Visit: Payer: Self-pay | Admitting: *Deleted

## 2017-02-25 DIAGNOSIS — I10 Essential (primary) hypertension: Secondary | ICD-10-CM

## 2017-02-28 ENCOUNTER — Encounter: Payer: Self-pay | Admitting: Family Medicine

## 2017-02-28 ENCOUNTER — Ambulatory Visit (INDEPENDENT_AMBULATORY_CARE_PROVIDER_SITE_OTHER): Payer: BC Managed Care – PPO | Admitting: Family Medicine

## 2017-02-28 DIAGNOSIS — I998 Other disorder of circulatory system: Secondary | ICD-10-CM | POA: Diagnosis not present

## 2017-02-28 NOTE — Assessment & Plan Note (Signed)
Currently on BBlocker and followed by Cardiology. In process of eval for pheo and ECHO/Stress test to eval heart function and for new CAD.  If neg eval.. Consider renal artery eval.

## 2017-02-28 NOTE — Progress Notes (Signed)
   Subjective:    Patient ID: [name], female    DOB: 1976/02/04, 41 y.o.   MRN: [id number]  HPI  Presents  for follow up blood pressure.  BPs elevated, flushing, SOB, dizziness. Saw regina Baity on 02/15/2107   Now off fluorinef,  Started on amlodipine 5 mg daily  BP Readings from Last 3 Encounters:  02/28/17 120/76  02/21/17 121/83  02/19/17 124/80    ED visit on 3/1 for chest pain, syncope, right neck pain  EKG borderline prolongued QT. Cbc, BMET, serial troponin unremarkable  CXR: neg  D dimer neg  Saw cardiology 3/2 stopped amlodipine and changed to BBlocker. Plan ECHO and stress test palnned with Dr. Lovena Le soon. Eval TSH and free t3, t4.. nml Nml heart cath in 2015 Has flushing, chest pain and neck pain with  BP increases. Now spikes of BP occurring daily, last < 1 hours, BBlocker has kept BP from being to high.  Concern for pheochromocytoma. She is currently collecting sample for catecholamines and VMA.   Review of Systems  Constitutional: Positive for fatigue.       Secondary to BBlocker  HENT: Negative for ear pain.   Eyes: Negative for pain.  Respiratory: Negative for cough, shortness of breath and wheezing.   Cardiovascular: Negative for chest pain.  Gastrointestinal: Negative for abdominal distention.       Objective:   Physical Exam  Constitutional: Vital signs are normal. She appears well-developed and well-nourished. She is cooperative.  Non-toxic appearance. She does not appear ill. No distress.  HENT:  Head: Normocephalic.  Right Ear: Hearing, tympanic membrane, external ear and ear canal normal. Tympanic membrane is not erythematous, not retracted and not bulging.  Left Ear: Hearing, tympanic membrane, external ear and ear canal normal. Tympanic membrane is not erythematous, not retracted and not bulging.  Nose: No mucosal edema or rhinorrhea. Right sinus exhibits no maxillary sinus tenderness and no frontal sinus tenderness. Left sinus exhibits no  maxillary sinus tenderness and no frontal sinus tenderness.  Mouth/Throat: Uvula is midline, oropharynx is clear and moist and mucous membranes are normal.  Eyes: Conjunctivae, EOM and lids are normal. Pupils are equal, round, and reactive to light. Lids are everted and swept, no foreign bodies found.  Neck: Trachea normal and normal range of motion. Neck supple. Carotid bruit is not present. No thyroid mass and no thyromegaly present.  Cardiovascular: Normal rate, regular rhythm, S1 normal, S2 normal, normal heart sounds, intact distal pulses and normal pulses.  Exam reveals no gallop and no friction rub.   No murmur heard. Pulmonary/Chest: Effort normal and breath sounds normal. No tachypnea. No respiratory distress. She has no decreased breath sounds. She has no wheezes. She has no rhonchi. She has no rales.  Abdominal: Soft. Normal appearance and bowel sounds are normal. There is no tenderness.  Neurological: She is alert.  Skin: Skin is warm, dry and intact. No rash noted.  Psychiatric: Her speech is normal and behavior is normal. Judgment and thought content normal. Her mood appears not anxious. Cognition and memory are normal. She does not exhibit a depressed mood.          Assessment & Plan:

## 2017-02-28 NOTE — Progress Notes (Signed)
Pre visit review using our clinic review tool, if applicable. No additional management support is needed unless otherwise documented below in the visit note. 

## 2017-03-01 ENCOUNTER — Ambulatory Visit: Payer: BC Managed Care – PPO | Admitting: Family Medicine

## 2017-03-03 ENCOUNTER — Encounter: Payer: Self-pay | Admitting: Family Medicine

## 2017-03-04 ENCOUNTER — Encounter: Payer: Self-pay | Admitting: Family Medicine

## 2017-03-04 ENCOUNTER — Ambulatory Visit: Payer: BC Managed Care – PPO | Admitting: Internal Medicine

## 2017-03-04 ENCOUNTER — Other Ambulatory Visit: Payer: BC Managed Care – PPO

## 2017-03-04 ENCOUNTER — Ambulatory Visit (INDEPENDENT_AMBULATORY_CARE_PROVIDER_SITE_OTHER): Payer: BC Managed Care – PPO | Admitting: Family Medicine

## 2017-03-04 VITALS — BP 104/80 | HR 82 | Temp 98.2°F | Wt 146.0 lb

## 2017-03-04 DIAGNOSIS — H6691 Otitis media, unspecified, right ear: Secondary | ICD-10-CM | POA: Diagnosis not present

## 2017-03-04 DIAGNOSIS — I10 Essential (primary) hypertension: Secondary | ICD-10-CM

## 2017-03-04 MED ORDER — AMOXICILLIN-POT CLAVULANATE 875-125 MG PO TABS: 1.0000 | ORAL_TABLET | Freq: Two times a day (BID) | ORAL | 0 refills | Status: AC

## 2017-03-04 NOTE — Progress Notes (Signed)
SUBJECTIVE: [name] is a 41 y.o. female pt of Dr. Diona Browner, new to me,  with 2 day(s) history of pain  right ear, and coryza, congestion and dry cough. Temperature not measured at home.   Current Outpatient Prescriptions on File Prior to Visit  Medication Sig Dispense Refill  . acetaminophen (TYLENOL) 500 MG tablet Take 1,000 mg by mouth every 6 (six) hours as needed for headache.    . albuterol (PROVENTIL HFA;VENTOLIN HFA) 108 (90 BASE) MCG/ACT inhaler Inhale 2 puffs into the lungs every 6 (six) hours as needed for wheezing or shortness of breath. 1 Inhaler 2  . albuterol (PROVENTIL) (2.5 MG/3ML) 0.083% nebulizer solution Take 3 mLs (2.5 mg total) by nebulization every 6 (six) hours as needed for wheezing or shortness of breath. 150 mL 1  . ALPRAZolam (XANAX) 0.25 MG tablet Take 2 tablets (0.5 mg total) by mouth daily as needed for anxiety (Take as directed). 30 tablet 1  . azaTHIOprine (IMURAN) 50 MG tablet TAKE 3 TABLETS BY MOUTH EVERY DAY AS DIRECTED. 90 tablet 6  . butalbital-acetaminophen-caffeine (FIORICET, ESGIC) 50-325-40 MG tablet TAKE 2 TABLETS EVERY 6 HOURS AS NEEDED 20 tablet 1  . Cetirizine HCl (ZYRTEC ALLERGY PO) Take 1 tablet by mouth daily.     . cyanocobalamin (,VITAMIN B-12,) 1000 MCG/ML injection Inject 1,000 mcg into the muscle every 21 ( twenty-one) days.    . cyclobenzaprine (FLEXERIL) 10 MG tablet Take 0.5-1 tablets (5-10 mg total) by mouth 3 (three) times daily as needed for muscle spasms. 30 tablet 0  . dexlansoprazole (DEXILANT) 60 MG capsule Take 1 capsule (60 mg total) by mouth daily. 30 capsule 5  . HYDROmorphone (DILAUDID) 2 MG tablet Take 1/2 to 1 tab every 4-6 hours as needed for pain. 20 tablet 0  . metoprolol tartrate (LOPRESSOR) 25 MG tablet Take 1 tablet (25 mg total) by mouth 2 (two) times daily. 180 tablet 3  . montelukast (SINGULAIR) 10 MG tablet Take 1 tablet (10 mg total) by mouth at bedtime. 30 tablet 3  . ranitidine (ZANTAC) 150 MG tablet Take 1  tablet (150 mg total) by mouth every morning. 30 tablet 3  . Vedolizumab (ENTYVIO IV) Inject into the vein as directed.    . zolpidem (AMBIEN) 10 MG tablet Take 1 tablet (10 mg total) by mouth at bedtime. 30 tablet 1   Current Facility-Administered Medications on File Prior to Visit  Medication Dose Route Frequency Provider Last Rate Last Dose  . 0.9 %  sodium chloride infusion  500 mL Intravenous Continuous Manus Gunning, MD        Allergies  Allergen Reactions  . Biaxin [Clarithromycin] Other (See Comments)    Other reaction(s): Bleeding Cant take due to Chrons disease  . Codeine Nausea And Vomiting and Other (See Comments)    Other reaction(s): Bleeding Dilaudid and demerol OK  . Compazine [Prochlorperazine Edisylate] Anaphylaxis  . Reglan [Metoclopramide] Anaphylaxis and Other (See Comments)    Other reaction(s): GI Upset (intolerance) Intensifies her Chron's  . Chlorhexidine Gluconate     Other reaction(s): Redness  . Doxycycline Nausea And Vomiting  . Other Rash    GuardIVa - PICC line antimicrobial dressing  Redness Resultant infection with PICC line x2 with irritation from adhesive "chloraprep"  . Oxycodone Nausea And Vomiting  . Sulfamethoxazole Rash and Other (See Comments)    Internal bleeding and kidney infection  . Sulfamethoxazole-Trimethoprim Other (See Comments)    "muscle twitches" Muscle spasms  . Chlorhexidine Other (See  Comments)    Blisters after using a couple of times.   . Remicade [Infliximab] Other (See Comments)    Serum Sickness  Couldn't walk, open mouth, pain,   . Hydrocodone Nausea And Vomiting  . Macrobid [Nitrofurantoin] Nausea And Vomiting    Other reaction(s): GI Upset (intolerance)  . Phenergan [Promethazine Hcl] Other (See Comments)    Other reaction(s): Delusions (intolerance) Muscular seizures "muscle twitches"  . Promethazine Rash and Other (See Comments)    Muscular seizures  . Sulfa Antibiotics Hives and Other (See  Comments)    Hematemesis; documented while a young child    Past Medical History:  Diagnosis Date  . Anxiety   . Asthma   . Chest pain    a. s/p intermediate risk nuclear stress test on 09/23/14 and normal LHC on 09/24/14   . Chronic nausea   . Crohn disease ([place]) 2000  . Dysautonomia    recurrent syncope s/p ILR by Dr Doreatha Lew  . Endometriosis   . GERD (gastroesophageal reflux disease)   . H/O hiatal hernia   . Migraine   . Palpitations   . Pulmonary embolism affecting pregnancy   . Vertigo     Past Surgical History:  Procedure Laterality Date  . [place] STUDY N/A 12/31/2016   Procedure: [place] STUDY;  Surgeon: Manus Gunning, MD;  Location: [facility];  Service: Gastroenterology;  Laterality: N/A;  . APPENDECTOMY  ~ 1992  . CESAREAN SECTION  2011   emergency  CS due to placental abruption  . CESAREAN SECTION  2014   "preeclampsia"  . COLON SURGERY    . CYSTOSCOPY W/ STONE MANIPULATION  X 2  . ENDOSCOPIC RELEASE TRANSVERSE CARPAL LIGAMENT OF HAND Right 12/2010  . ESOPHAGEAL MANOMETRY N/A 12/31/2016   Procedure: ESOPHAGEAL MANOMETRY (EM);  Surgeon: Manus Gunning, MD;  Location: [facility];  Service: Gastroenterology;  Laterality: N/A;  . ILEOCECETOMY  2002 X 2  . KNEE ARTHROSCOPY Bilateral 1988-1990  . LAPAROSCOPY FOR ECTOPIC PREGNANCY  11/2012  . LEFT HEART CATHETERIZATION WITH CORONARY ANGIOGRAM N/A 09/24/2014   Procedure: LEFT HEART CATHETERIZATION WITH CORONARY ANGIOGRAM;  Surgeon: Leonie Man, MD;  Location[facility] C[facility];  Service: Cardiovascular;  Laterality: N/A;  . LOOP RECORDER IMPLANT  11/2009   by DR Doreatha Lew  . NASAL SINUS SURGERY  ~ 2007  . PARTIAL COLECTOMY  2002   partial removed due to crohns  . TONSILLECTOMY  ~ 1996  . US ECHOCARDIOGRAPHY  11/11/2009   EF 55-60%    Family History  Problem Relation Age of Onset  . Hypertension Mother   . Hyperlipidemia Mother   . Arthritis Mother   . Diabetes Father   . Coronary artery  disease Father   . Heart disease Father 68    MI age 30  . Hyperlipidemia Brother   . Hypertension Brother   . Colon cancer Paternal Grandmother     Social History   Social History  . Marital status: Married    Spouse name: [name]  . Number of children: 2  . Years of education: college   Occupational History  .  Other    Airline pilot  .  [place] History Main Topics  . Smoking status: Never Smoker  . Smokeless tobacco: Never Used  . Alcohol use Yes  . Drug use: No  . Sexual activity: Yes    Birth control/ protection: Surgical   Other Topics Concern  . Not  on file   Social History Narrative   Patient lives at home with her husband [name]). Patient works full time for [employer].    Education [employer].   Right handed.   Caffeine one cup of coffee daily.   The PMH, PSH, Social History, Family History, Medications, and allergies have been reviewed in [facility], and have been updated if relevant.  OBJECTIVE: LMP 02/07/2017 (Approximate)   LMP 02/07/2017 (Approximate)  General appearance: alert, well appearing, and in no distress.   Ears: right TM red, dull, bulging Nose: normal and patent, no erythema, discharge or polyps Oropharynx: mucous membranes moist, pharynx normal without lesions Neck: supple, no significant adenopathy Lungs: clear to auscultation, no wheezes, rales or rhonchi, symmetric air entry  ASSESSMENT: Otitis Media  PLAN: 1) See orders for this visit as documented in the electronic medical record. 2) Symptomatic therapy suggested: use acetaminophen, ibuprofen prn.  3) Call or return to clinic prn if these symptoms worsen or fail to improve as anticipated.

## 2017-03-04 NOTE — Telephone Encounter (Signed)
[name] was seen by Dr. Deborra Medina today 03/04/2017.

## 2017-03-04 NOTE — Patient Instructions (Signed)
Great to see you.  Please take Augmentin as directed- 1 tablet twice daily x 10 days.

## 2017-03-04 NOTE — Progress Notes (Signed)
Pre visit review using our clinic review tool, if applicable. No additional management support is needed unless otherwise documented below in the visit note. 

## 2017-03-04 NOTE — Telephone Encounter (Signed)
Please help her set up an appt. See mychart message

## 2017-03-05 ENCOUNTER — Telehealth: Payer: Self-pay

## 2017-03-05 ENCOUNTER — Encounter: Payer: Self-pay | Admitting: Internal Medicine

## 2017-03-05 ENCOUNTER — Other Ambulatory Visit: Payer: Self-pay

## 2017-03-05 ENCOUNTER — Telehealth: Payer: Self-pay | Admitting: Gastroenterology

## 2017-03-05 DIAGNOSIS — K5090 Crohn's disease, unspecified, without complications: Secondary | ICD-10-CM

## 2017-03-05 NOTE — Telephone Encounter (Signed)
Faxed referral to [facility] to start having infusions done there instead of hospital setting. Patient had received letter in the mail that states they will not pay if done at hospital setting. Amb ref placed. Let patient know that she should expect a call from them to set up her next infusion, she will cancel hospital infusion.

## 2017-03-05 NOTE — Telephone Encounter (Signed)
See other notes about referral to [facility].

## 2017-03-06 ENCOUNTER — Encounter: Payer: Self-pay | Admitting: Physician Assistant

## 2017-03-07 ENCOUNTER — Telehealth: Payer: Self-pay | Admitting: Physician Assistant

## 2017-03-07 DIAGNOSIS — I10 Essential (primary) hypertension: Secondary | ICD-10-CM

## 2017-03-07 LAB — CATECHOLAMINES, FRACTIONATED, URINE, 24 HOUR
DOPAMINE RANDOM UR: 333 ug/L
Dopamine , 24H Ur: 416 ug/24 hr (ref 0–510)
EPINEPHRINE RAND UR: 2 ug/L
Epinephrine, 24H Ur: 3 ug/24 hr (ref 0–20)
NOREPINEPH RAND UR: 43 ug/L
NOREPINEPHRINE 24H UR: 54 ug/(24.h) (ref 0–135)

## 2017-03-07 LAB — METANEPHRINES, URINE, 24 HOUR
Metaneph Total, Ur: 88 ug/L
Metanephrines, 24H Ur: 110 ug/24 hr (ref 45–290)
NORMETANEPHRINE UR: 299 ug/L
Normetanephrine, 24H Ur: 374 ug/24 hr (ref 82–500)

## 2017-03-07 NOTE — Telephone Encounter (Signed)
Please arrange renal artery duplex and call patient to schedule. Dx: Hypertension. I entered order already.  Richardson Dopp, PA-C   03/07/2017 2:41 PM

## 2017-03-07 NOTE — Telephone Encounter (Signed)
Patient was notified that she was scheduled for a renal artery duplex on 03/15/17 at 10:00 am at the [facility] office. Patient verbalized understanding.

## 2017-03-07 NOTE — Telephone Encounter (Signed)
Left message for patient to call back  

## 2017-03-07 NOTE — Telephone Encounter (Signed)
Patient was notified of Trinidad Curet recommendation to set up a renal artery duplex. A message was sent to [facility] to schedule this. Patient was notified that she would be receiving a call to schedule this. Patient verbalized understanding.

## 2017-03-11 ENCOUNTER — Other Ambulatory Visit: Payer: Self-pay | Admitting: Family Medicine

## 2017-03-11 NOTE — Telephone Encounter (Addendum)
Butalbital-APAP-Caffeine called into [facility].

## 2017-03-11 NOTE — Telephone Encounter (Signed)
Last office visit 03/04/2017 with Dr. Deborra Medina.  Last refilled 11/08/2016 for #20 with 1 refill.  Ok to refill?

## 2017-03-14 ENCOUNTER — Other Ambulatory Visit: Payer: Self-pay

## 2017-03-14 ENCOUNTER — Ambulatory Visit (INDEPENDENT_AMBULATORY_CARE_PROVIDER_SITE_OTHER): Payer: BC Managed Care – PPO

## 2017-03-14 ENCOUNTER — Telehealth: Payer: Self-pay | Admitting: *Deleted

## 2017-03-14 ENCOUNTER — Ambulatory Visit (HOSPITAL_COMMUNITY): Payer: BC Managed Care – PPO | Attending: Cardiovascular Disease

## 2017-03-14 DIAGNOSIS — R079 Chest pain, unspecified: Secondary | ICD-10-CM | POA: Diagnosis not present

## 2017-03-14 LAB — EXERCISE TOLERANCE TEST
CHL CUP MPHR: 179 {beats}/min
CSEPHR: 90 %
Estimated workload: 10.1 METS
Exercise duration (min): 9 min
Exercise duration (sec): 0 s
Peak HR: 162 {beats}/min
RPE: 16
Rest HR: 63 {beats}/min

## 2017-03-14 NOTE — Telephone Encounter (Signed)
I s/w DOD Dr. Meda Coffee in regards to syncopal event Saturday 03/09/17 when the pt states she missed her morning dose of Metoprolol. Per Dr. Meda Coffee DOD pt advised to be compliant with her Metoprolol.   I then called pt and went over recommendations per Dr. Meda Coffee DOD to make sure she is taking her Metoprolol tartrate 25 mg BID. Pt is agreeable to plan of care and is making sure she is taking her medication.

## 2017-03-14 NOTE — Telephone Encounter (Signed)
Pt notified of GXT results by phone with verbal understanding. Pt advised to keep her appt with Dr. Lovena Le 03/20/17 to discuss results further as well as possibly may need to increase beat blocker for better HR and BP control per Truitt Merle, NP. Pt states to me she wanted to let Dr. Lovena Le know that she had a syncopal episode on Saturday. Pt asked if her echo results were in yet. I replied not yet, though once read we will call with results. Pt is scheduled for renal ultrasound tomorrow. I did advise pt to try to limit caffeine and to make sure she is staying hydrated until she sees Dr. Lovena Le. I did also advise pt if she passes out again she needs to call the office or if on the weekend to call the office # and s/w on-call provider. Pt is agreeable to plan of care.

## 2017-03-14 NOTE — Telephone Encounter (Signed)
-----   Message from Burtis Junes, NP sent at 03/14/2017  2:08 PM EDT ----- Ok to report.  Reviewed for Richardson Dopp, PA Her GXT is normal.  Would keep her follow up with Dr. Lovena Le as planned for further discussion.  May need increase in her beta blocker based on peak HR and BP.

## 2017-03-15 ENCOUNTER — Encounter (HOSPITAL_COMMUNITY): Admission: RE | Admit: 2017-03-15 | Payer: BC Managed Care – PPO | Source: Ambulatory Visit

## 2017-03-15 ENCOUNTER — Ambulatory Visit (HOSPITAL_COMMUNITY)
Admission: RE | Admit: 2017-03-15 | Discharge: 2017-03-15 | Disposition: A | Discharge status: Home / Self Care | Payer: BC Managed Care – PPO | Source: Ambulatory Visit | Attending: Cardiology | Admitting: Cardiology

## 2017-03-15 DIAGNOSIS — I10 Essential (primary) hypertension: Secondary | ICD-10-CM | POA: Diagnosis present

## 2017-03-15 IMAGING — XA IR FLUORO GUIDE SPINAL/SI JT INJ*L*
1 series · 4 of 4 positions shown · non-contrast
Comparison: none

CLINICAL DATA: Positional headache consistent with spinal headache.
Recent lumbar puncture performed at the bedside.

[Series 300: line placements · 4 of 4 slices shown]
[im 1/4]
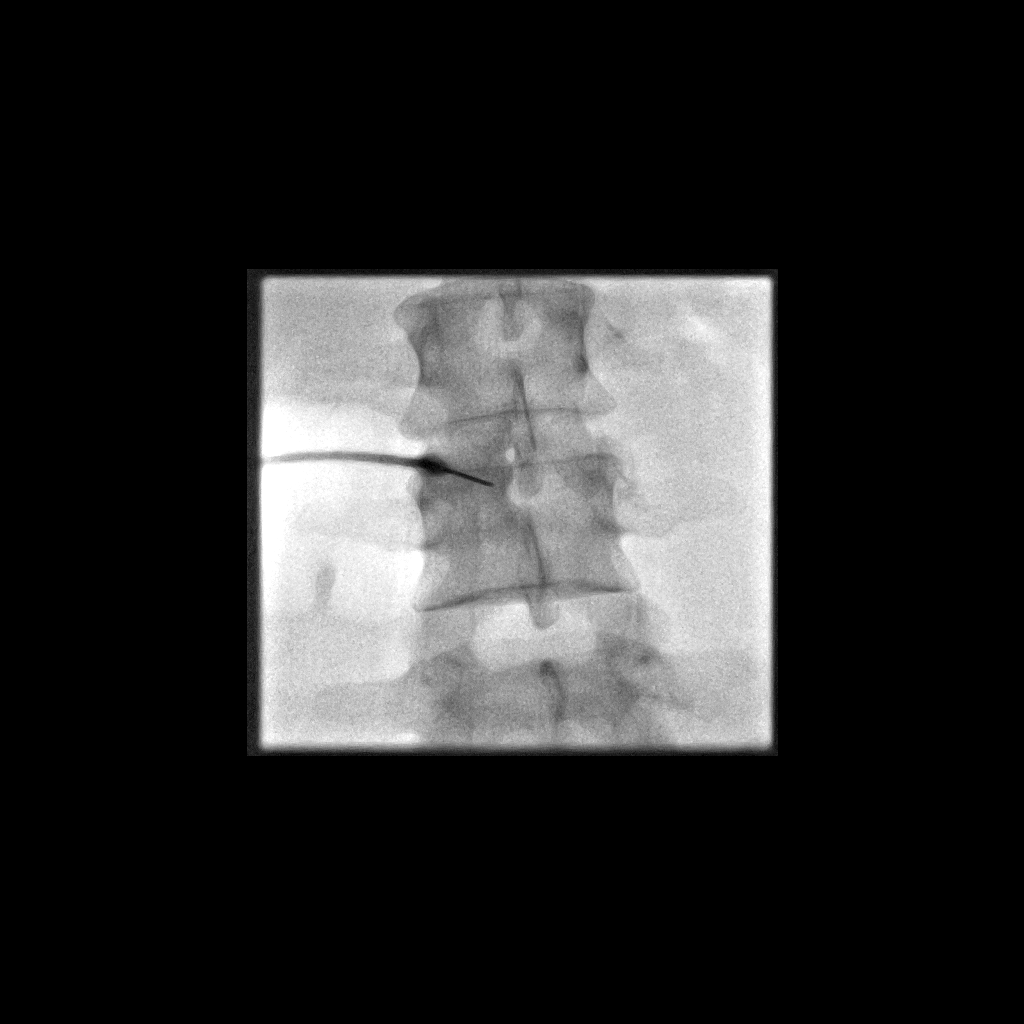
[im 2/4]
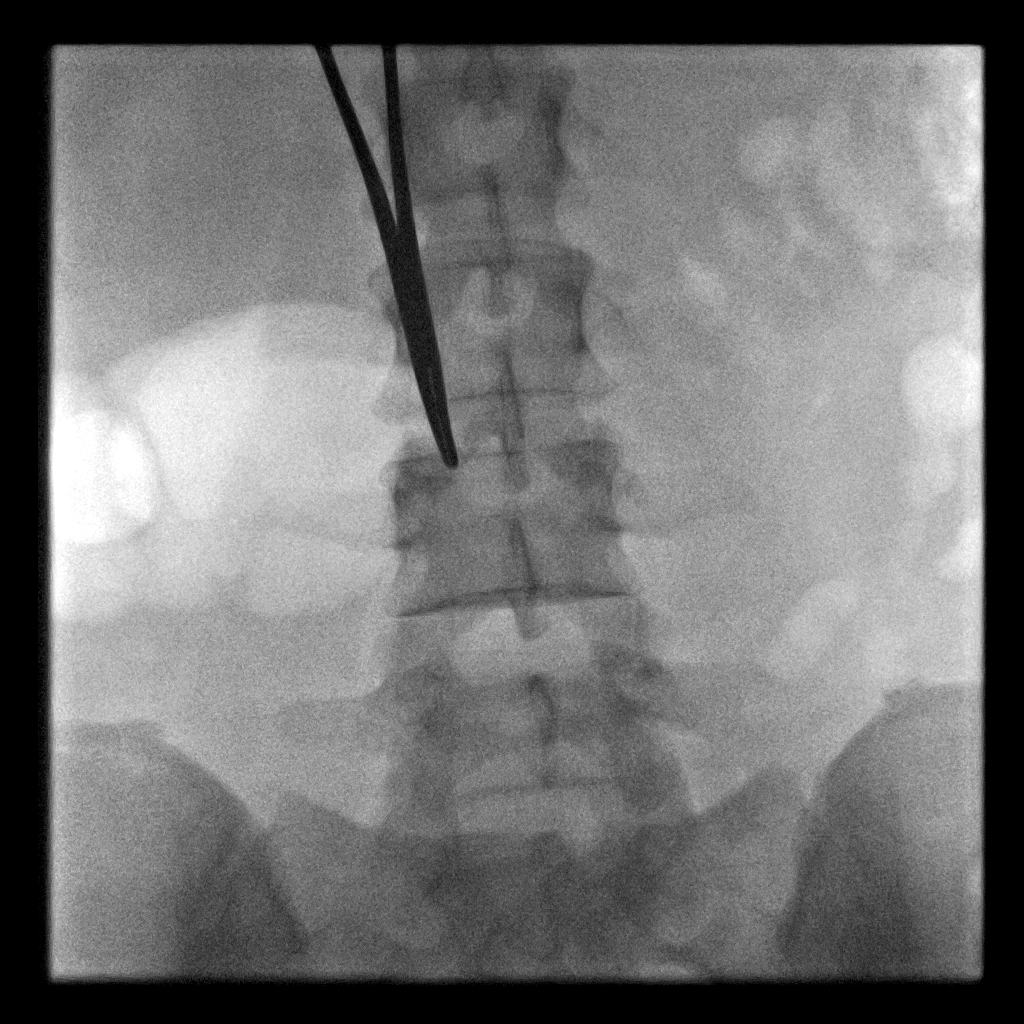
[im 3/4]
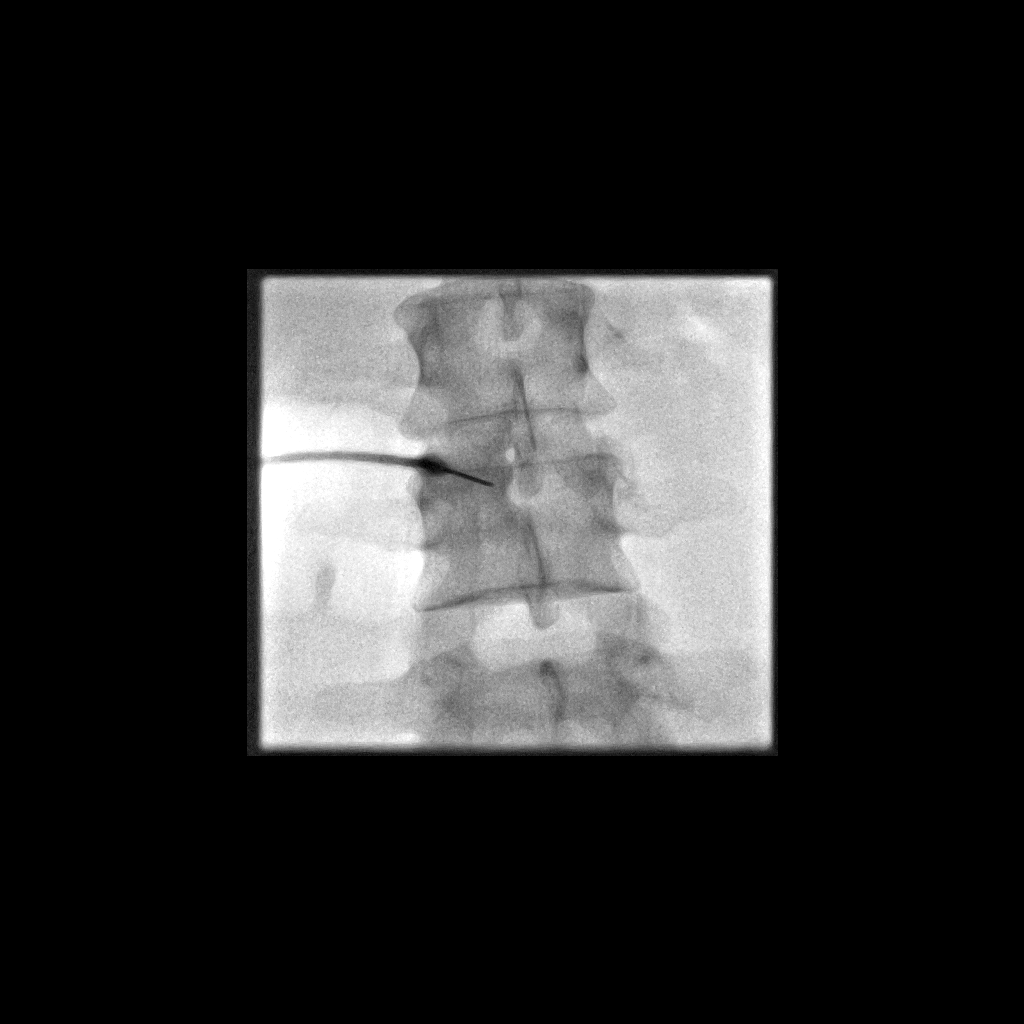
[im 4/4]
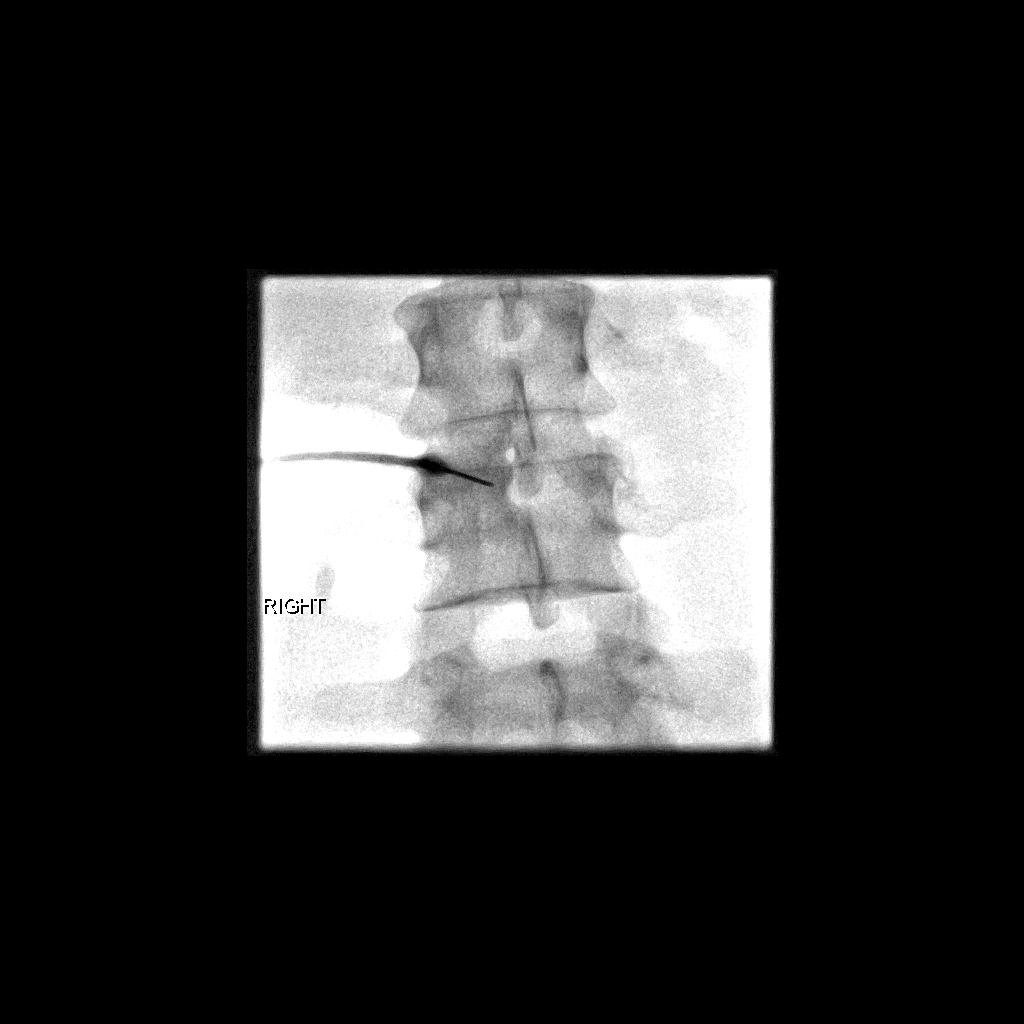

[4 of 4 positions shown; findings below may reference images not displayed]

FLUOROSCOPY TIME:  6 second.  One mGy.

PROCEDURE:
EPIDURAL BLOOD PATCH

The procedure, risks, benefits, and alternatives were explained to
the patient. Questions regarding the procedure were encouraged and
answered. The patient understands and consents to the procedure.

15 cc of blood were withdrawn from the patient's left antecubital
fossa utilizing strict sterile technique and Betadine prep. A
puncture site was localized in the mid low back. Fluoroscopic
imaging demonstrates that this is at the L3-4 level. An epidural
approach was taken on the left at L3-4 using a 20 gauge Crawford
epidural needle. Epidural positioning was confirmed by injecting a
small amount of Omnipaque 180. There was no vascular communication.
15 cc of the patient's blood was slowly injected into the epidural
space in this location. The procedure was well-tolerated and she was
discharged in good condition with instructions to lie down for
additional day.

COMPLICATIONS:
None
IMPRESSION: Lumbar epidural blood patch on the left at L3-4.

## 2017-03-17 ENCOUNTER — Encounter: Payer: Self-pay | Admitting: Physician Assistant

## 2017-03-17 DIAGNOSIS — I10 Essential (primary) hypertension: Secondary | ICD-10-CM

## 2017-03-17 HISTORY — DX: Essential (primary) hypertension: I10

## 2017-03-18 ENCOUNTER — Telehealth: Payer: Self-pay | Admitting: *Deleted

## 2017-03-18 ENCOUNTER — Encounter: Payer: Self-pay | Admitting: Family Medicine

## 2017-03-18 NOTE — Telephone Encounter (Signed)
Pt notified of normal Renal US results by phone with verbal understanding. I will fax a copy of results to PCP as well. Pt advised to keep her upcoming appt with Dr. Junious Silk 03/20/17. Pt is agreeable to plan of care. Pt is anxious as to what is making her heart race causing her to pass out. She states she has asked her for her PCP to give her something to help her anxiety. Pt states she is afraid going out anywhere now and that she might pass out.

## 2017-03-20 ENCOUNTER — Encounter: Payer: Self-pay | Admitting: Physician Assistant

## 2017-03-20 ENCOUNTER — Other Ambulatory Visit: Payer: Self-pay | Admitting: Family Medicine

## 2017-03-20 ENCOUNTER — Ambulatory Visit (INDEPENDENT_AMBULATORY_CARE_PROVIDER_SITE_OTHER): Payer: BC Managed Care – PPO | Admitting: Internal Medicine

## 2017-03-20 ENCOUNTER — Encounter: Payer: Self-pay | Admitting: Internal Medicine

## 2017-03-20 DIAGNOSIS — I10 Essential (primary) hypertension: Secondary | ICD-10-CM | POA: Diagnosis not present

## 2017-03-20 MED ORDER — METOPROLOL TARTRATE 25 MG PO TABS: 12.5000 mg | ORAL_TABLET | Freq: Two times a day (BID) | ORAL | 3 refills | Status: DC

## 2017-03-20 MED ORDER — MIDODRINE HCL 5 MG PO TABS: 5.0000 mg | ORAL_TABLET | Freq: Two times a day (BID) | ORAL | 11 refills | Status: DC

## 2017-03-20 MED ORDER — PAROXETINE HCL 20 MG PO TABS: 20.0000 mg | ORAL_TABLET | Freq: Every day | ORAL | 3 refills | Status: DC

## 2017-03-20 NOTE — Progress Notes (Signed)
HPI [name] returns today for followup of autonomic dysfunction. She is a very pleasant 41 year old woman with a history of autonomic dysfunction, who is also known to have hyperemesis gravidarum. She has had worsening in her syncopal episodes. 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 Her Crohn disease is quiet at present. She apparently developed serum sickness on Remicade. Previously she had been on florinef but developed hypertension. She was placed on amlodipine and started passing out. She was switched to metoprolol. She has continued to pass out. She has undergone an extensive workup including a 2D echo, stress test, urine for metanephrines and ultrasound of her renal arteries, all of which have been negative. The patient notes palpitations and has had some elevation in her blood pressure. She has passed out on multiple occaisions.   Allergies  Allergen Reactions  . Biaxin [Clarithromycin] Other (See Comments)    Other reaction(s): Bleeding Cant take due to Chrons disease  . Codeine Nausea And Vomiting and Other (See Comments)    Other reaction(s): Bleeding Dilaudid and demerol OK  . Compazine [Prochlorperazine Edisylate] Anaphylaxis  . Reglan [Metoclopramide] Anaphylaxis and Other (See Comments)    Other reaction(s): GI Upset (intolerance) Intensifies her Chron's  . Chlorhexidine Gluconate     Other reaction(s): Redness  . Doxycycline Nausea And Vomiting  . Other Rash    GuardIVa - PICC line antimicrobial dressing  Redness Resultant infection with PICC line x2 with irritation from adhesive "chloraprep"  . Oxycodone Nausea And Vomiting  . Sulfamethoxazole Rash and Other (See Comments)    Internal bleeding and kidney infection  . Sulfamethoxazole-Trimethoprim Other (See Comments)    "muscle twitches" Muscle spasms  . Chlorhexidine Other (See Comments)    Blisters after using a couple of times.   . Remicade [Infliximab] Other  (See Comments)    Serum Sickness  Couldn't walk, open mouth, pain,   . Hydrocodone Nausea And Vomiting  . Macrobid [Nitrofurantoin] Nausea And Vomiting    Other reaction(s): GI Upset (intolerance)  . Phenergan [Promethazine Hcl] Other (See Comments)    Other reaction(s): Delusions (intolerance) Muscular seizures "muscle twitches"  . Promethazine Rash and Other (See Comments)    Muscular seizures  . Sulfa Antibiotics Hives and Other (See Comments)    Hematemesis; documented while a young child     Current Outpatient Prescriptions  Medication Sig Dispense Refill  . acetaminophen (TYLENOL) 500 MG tablet Take 1,000 mg by mouth every 6 (six) hours as needed for headache.    . albuterol (PROVENTIL HFA;VENTOLIN HFA) 108 (90 BASE) MCG/ACT inhaler Inhale 2 puffs into the lungs every 6 (six) hours as needed for wheezing or shortness of breath. 1 Inhaler 2  . albuterol (PROVENTIL) (2.5 MG/3ML) 0.083% nebulizer solution Take 3 mLs (2.5 mg total) by nebulization every 6 (six) hours as needed for wheezing or shortness of breath. 150 mL 1  . ALPRAZolam (XANAX) 0.25 MG tablet Take 2 tablets (0.5 mg total) by mouth daily as needed for anxiety (Take as directed). 30 tablet 1  . azaTHIOprine (IMURAN) 50 MG tablet TAKE 3 TABLETS BY MOUTH EVERY DAY AS DIRECTED. 90 tablet 6  . butalbital-acetaminophen-caffeine (FIORICET, ESGIC) 50-325-40 MG tablet TAKE TWO CAPSULES BY MOUTH EVERY 6 HOURSAS NEEDED. 20 tablet 0  . Cetirizine HCl (ZYRTEC ALLERGY PO) Take 1 tablet by mouth daily.     . cyanocobalamin (,VITAMIN B-12,) 1000 MCG/ML injection Inject 1,000 mcg into the muscle every 21 (  twenty-one) days.    . cyclobenzaprine (FLEXERIL) 10 MG tablet Take 0.5-1 tablets (5-10 mg total) by mouth 3 (three) times daily as needed for muscle spasms. 30 tablet 0  . dexlansoprazole (DEXILANT) 60 MG capsule Take 1 capsule (60 mg total) by mouth daily. 30 capsule 5  . metoprolol tartrate (LOPRESSOR) 25 MG tablet Take 1 tablet (25  mg total) by mouth 2 (two) times daily. 180 tablet 3  . montelukast (SINGULAIR) 10 MG tablet Take 1 tablet (10 mg total) by mouth at bedtime. 30 tablet 3  . ranitidine (ZANTAC) 150 MG tablet Take 1 tablet (150 mg total) by mouth every morning. 30 tablet 3  . Vedolizumab (ENTYVIO IV) Inject into the vein as directed.    . zolpidem (AMBIEN) 10 MG tablet Take 1 tablet (10 mg total) by mouth at bedtime. 30 tablet 1   Current Facility-Administered Medications  Medication Dose Route Frequency Provider Last Rate Last Dose  . 0.9 %  sodium chloride infusion  500 mL Intravenous Continuous Manus Gunning, MD         Past Medical History:  Diagnosis Date  . Anxiety   . Asthma   . Chest pain    a. s/p intermediate risk nuclear stress test on 09/23/14 and normal LHC on 09/24/14   . Chronic nausea   . Crohn disease ([place]) 2000  . Dysautonomia    recurrent syncope s/p ILR by Dr Doreatha Lew  . Endometriosis   . Essential hypertension 03/17/2017   a - Renal artery [name] 3/18: normal    . GERD (gastroesophageal reflux disease)   . H/O hiatal hernia   . Migraine   . Palpitations   . Pulmonary embolism affecting pregnancy   . Vertigo     ROS:   All systems reviewed and negative except as noted in the HPI.   Past Surgical History:  Procedure Laterality Date  . [place] STUDY N/A 12/31/2016   Procedure: [place] STUDY;  Surgeon: Manus Gunning, MD;  Location: [facility];  Service: Gastroenterology;  Laterality: N/A;  . APPENDECTOMY  ~ 1992  . CESAREAN SECTION  2011   emergency  CS due to placental abruption  . CESAREAN SECTION  2014   "preeclampsia"  . COLON SURGERY    . CYSTOSCOPY W/ STONE MANIPULATION  X 2  . ENDOSCOPIC RELEASE TRANSVERSE CARPAL LIGAMENT OF HAND Right 12/2010  . ESOPHAGEAL MANOMETRY N/A 12/31/2016   Procedure: ESOPHAGEAL MANOMETRY (EM);  Surgeon: Manus Gunning, MD;  Location: [facility];  Service: Gastroenterology;  Laterality: N/A;  . ILEOCECETOMY   2002 X 2  . KNEE ARTHROSCOPY Bilateral 1988-1990  . LAPAROSCOPY FOR ECTOPIC PREGNANCY  11/2012  . LEFT HEART CATHETERIZATION WITH CORONARY ANGIOGRAM N/A 09/24/2014   Procedure: LEFT HEART CATHETERIZATION WITH CORONARY ANGIOGRAM;  Surgeon: Leonie Man, MD;  [facility] C[facility] LAB;  Service: Cardiovascular;  Laterality: N/A;  . LOOP RECORDER IMPLANT  11/2009   by DR Doreatha Lew  . NASAL SINUS SURGERY  ~ 2007  . PARTIAL COLECTOMY  2002   partial removed due to crohns  . TONSILLECTOMY  ~ 1996  . US ECHOCARDIOGRAPHY  11/11/2009   EF 55-60%     Family History  Problem Relation Age of Onset  . Hypertension Mother   . Hyperlipidemia Mother   . Arthritis Mother   . Diabetes Father   . Coronary artery disease Father   . Heart disease Father 64    MI age 51  .  Hyperlipidemia Brother   . Hypertension Brother   . Colon cancer Paternal Grandmother      Social History   Social History  . Marital status: Married    Spouse name: Jonni Sanger  . Number of children: 2  . Years of education: college   Occupational History  .  Other    Airline pilot  .  [place] History Main Topics  . Smoking status: Never Smoker  . Smokeless tobacco: Never Used  . Alcohol use Yes  . Drug use: No  . Sexual activity: Yes    Birth control/ protection: Surgical   Other Topics Concern  . Not on file   Social History Narrative   Patient lives at home with her husband Jonni Sanger). Patient works full time for [employer].    Education [employer].   Right handed.   Caffeine one cup of coffee daily.     BP 102/84   Pulse 100   Ht 5' 4.5" (1.638 m)   Wt 145 lb 3.2 oz (65.9 kg)   SpO2 97%   BMI 24.54 kg/m   Physical Exam:  Stable appearing 41 year old woman, NAD HEENT: Unremarkable Neck:  6 cm JVD, no thyromegally Back:  No CVA tenderness Lungs:  Clear with no wheezes, rales, or rhonchi. Indwelling PICC line in left arm. HEART:  Regular rate rhythm, no murmurs, no  rubs, no clicks Abd:  soft, positive bowel sounds, no organomegally, no rebound, no guarding Ext:  2 plus pulses, no edema, no cyanosis, no clubbing Skin:  No rashes no nodules Neuro:  CN II through XII intact, motor grossly intact   Assess/Plan: 1. Recurrent syncope in the setting of known autonomic dysfunction - I have asked her to reduce her dose of metoprolol and start midodrine 5 mg twice daily. If she cannot tolerate or blood pressure is too high, will reduce dose or stop metoprolol.  2. HTN - her blood pressure was high on florinef. I have instructed her that I would like for her pressures to trend higher, at least in the short term. 3. H/o pulmonary embolism - she has had none of the same symptoms she had with her PE. Will not plan any additional eval at this time. She had a d-dimer when she was seen in the ER and it was negative. 4. Crohn disease - her symptoms have been fairly well controlled on with Entyvio.  Mikle Bosworth.D.

## 2017-03-20 NOTE — Patient Instructions (Addendum)
Your physician has recommended you make the following change in your medication:  1.) midodrine 5 mg --1 tablet two times a day 2.) decrease metoprolol to 12.5 mg twice daily  Your physician recommends that you schedule a follow-up appointment in: 8 weeks with Dr. Lovena Le, afternoon please.

## 2017-03-26 ENCOUNTER — Encounter: Payer: Self-pay | Admitting: Family Medicine

## 2017-03-28 ENCOUNTER — Other Ambulatory Visit: Payer: Self-pay | Admitting: Family Medicine

## 2017-03-28 NOTE — Telephone Encounter (Signed)
Alprazolam called into [facility], [place]: [phone].

## 2017-03-28 NOTE — Telephone Encounter (Signed)
Last office visit 03/13/17 with Dr. Deborra Medina.  Last refilled 10/30/16 for #30 with 1 refill.  Ok to refill?

## 2017-03-29 ENCOUNTER — Encounter (HOSPITAL_COMMUNITY): Payer: BC Managed Care – PPO

## 2017-04-05 ENCOUNTER — Encounter (HOSPITAL_COMMUNITY): Payer: BC Managed Care – PPO

## 2017-04-18 ENCOUNTER — Other Ambulatory Visit: Payer: Self-pay | Admitting: Family Medicine

## 2017-04-19 NOTE — Telephone Encounter (Signed)
Zolpidem called into [facility] [place] [facility] Phone: [phone]

## 2017-04-19 NOTE — Telephone Encounter (Signed)
Last office visit 03/04/2017 with Dr. Deborra Medina.  Last refilled 01/14/2017 jfor #30 with 1 refill. Ok to refill?

## 2017-04-29 ENCOUNTER — Ambulatory Visit (INDEPENDENT_AMBULATORY_CARE_PROVIDER_SITE_OTHER): Payer: BC Managed Care – PPO | Admitting: Internal Medicine

## 2017-04-29 ENCOUNTER — Encounter: Payer: Self-pay | Admitting: Internal Medicine

## 2017-04-29 VITALS — BP 130/88 | HR 98 | Temp 98.3°F | Wt 148.0 lb

## 2017-04-29 DIAGNOSIS — R350 Frequency of micturition: Secondary | ICD-10-CM

## 2017-04-29 DIAGNOSIS — R3915 Urgency of urination: Secondary | ICD-10-CM | POA: Diagnosis not present

## 2017-04-29 DIAGNOSIS — R300 Dysuria: Secondary | ICD-10-CM

## 2017-04-29 LAB — POC URINALSYSI DIPSTICK (AUTOMATED)
Glucose, UA: NEGATIVE
Ketones, UA: NEGATIVE
LEUKOCYTES UA: NEGATIVE
NITRITE UA: NEGATIVE
PH UA: 6 (ref 5.0–8.0)
Protein, UA: NEGATIVE
RBC UA: NEGATIVE
Spec Grav, UA: >=1.03 — AB (ref 1.010–1.025)
Urobilinogen, UA: 0.2 E.U./dL

## 2017-04-29 NOTE — Progress Notes (Signed)
Pre visit review using our clinic review tool, if applicable. No additional management support is needed unless otherwise documented below in the visit note. 

## 2017-04-29 NOTE — Patient Instructions (Signed)
Dysuria Dysuria is pain or discomfort while urinating. The pain or discomfort may be felt in the tube that carries urine out of the bladder (urethra) or in the surrounding tissue of the genitals. The pain may also be felt in the groin area, lower abdomen, and lower back. You may have to urinate frequently or have the sudden feeling that you have to urinate (urgency). Dysuria can affect both men and women, but is more common in women. Dysuria can be caused by many different things, including:  Urinary tract infection in women.  Infection of the kidney or bladder.  Kidney stones or bladder stones.  Certain sexually transmitted infections (STIs), such as chlamydia.  Dehydration.  Inflammation of the vagina.  Use of certain medicines.  Use of certain soaps or scented products that cause irritation. Follow these instructions at home: Watch your dysuria for any changes. The following actions may help to reduce any discomfort you are feeling:  Drink enough fluid to keep your urine clear or pale yellow.  Empty your bladder often. Avoid holding urine for long periods of time.  After a bowel movement or urination, women should cleanse from front to back, using each tissue only once.  Empty your bladder after sexual intercourse.  Take medicines only as directed by your health care provider.  If you were prescribed an antibiotic medicine, finish it all even if you start to feel better.  Avoid caffeine, tea, and alcohol. They can irritate the bladder and make dysuria worse. In men, alcohol may irritate the prostate.  Keep all follow-up visits as directed by your health care provider. This is important.  If you had any tests done to find the cause of dysuria, it is your responsibility to obtain your test results. Ask the lab or department performing the test when and how you will get your results. Talk with your health care provider if you have any questions about your results. Contact a  health care provider if:  You develop pain in your back or sides.  You have a fever.  You have nausea or vomiting.  You have blood in your urine.  You are not urinating as often as you usually do. Get help right away if:  You pain is severe and not relieved with medicines.  You are unable to hold down any fluids.  You or someone else notices a change in your mental function.  You have a rapid heartbeat at rest.  You have shaking or chills.  You feel extremely weak. This information is not intended to replace advice given to you by your health care provider. Make sure you discuss any questions you have with your health care provider. Document Released: 09/07/2004 Document Revised: 05/17/2016 Document Reviewed: 08/05/2014 Elsevier Interactive Patient Education  2017 [employer].

## 2017-04-29 NOTE — Progress Notes (Signed)
HPI  Pt presents to the clinic today with c/o urinary urgency, frequency, dysuria and low back pain. This started 1 week. She denies fever, chills, nausea or blood in her urine. She has not taken anything OTC for this. She denies vaginal complaints. She has a history of kidney stones, but reports this feel different.   Review of Systems  Past Medical History:  Diagnosis Date  . Anxiety   . Asthma   . Chest pain    a. s/p intermediate risk nuclear stress test on 09/23/14 and normal LHC on 09/24/14   . Chronic nausea   . Crohn disease ([place]) 2000  . Dysautonomia    recurrent syncope s/p ILR by Dr Doreatha Lew  . Endometriosis   . Essential hypertension 03/17/2017   a - Renal artery [name] 3/18: normal    . GERD (gastroesophageal reflux disease)   . H/O hiatal hernia   . Migraine   . Palpitations   . Pulmonary embolism affecting pregnancy   . Vertigo     Family History  Problem Relation Age of Onset  . Hypertension Mother   . Hyperlipidemia Mother   . Arthritis Mother   . Diabetes Father   . Coronary artery disease Father   . Heart disease Father 75    MI age 62  . Hyperlipidemia Brother   . Hypertension Brother   . Colon cancer Paternal Grandmother     Social History   Social History  . Marital status: Married    Spouse name: Jonni Sanger  . Number of children: 2  . Years of education: college   Occupational History  .  Other    Airline pilot  .  [place] History Main Topics  . Smoking status: Never Smoker  . Smokeless tobacco: Never Used  . Alcohol use Yes  . Drug use: No  . Sexual activity: Yes    Birth control/ protection: Surgical   Other Topics Concern  . Not on file   Social History Narrative   Patient lives at home with her husband Jonni Sanger). Patient works full time for [employer].    Education [employer].   Right handed.   Caffeine one cup of coffee daily.    Allergies  Allergen Reactions  . Biaxin [Clarithromycin] Other  (See Comments)    Other reaction(s): Bleeding Cant take due to Chrons disease  . Codeine Nausea And Vomiting and Other (See Comments)    Other reaction(s): Bleeding Dilaudid and demerol OK  . Compazine [Prochlorperazine Edisylate] Anaphylaxis  . Reglan [Metoclopramide] Anaphylaxis and Other (See Comments)    Other reaction(s): GI Upset (intolerance) Intensifies her Chron's  . Chlorhexidine Gluconate     Other reaction(s): Redness  . Doxycycline Nausea And Vomiting  . Other Rash    GuardIVa - PICC line antimicrobial dressing  Redness Resultant infection with PICC line x2 with irritation from adhesive "chloraprep"  . Oxycodone Nausea And Vomiting  . Sulfamethoxazole Rash and Other (See Comments)    Internal bleeding and kidney infection  . Sulfamethoxazole-Trimethoprim Other (See Comments)    "muscle twitches" Muscle spasms  . Chlorhexidine Other (See Comments)    Blisters after using a couple of times.   . Remicade [Infliximab] Other (See Comments)    Serum Sickness  Couldn't walk, open mouth, pain,   . Hydrocodone Nausea And Vomiting  . Macrobid [Nitrofurantoin] Nausea And Vomiting    Other reaction(s): GI Upset (intolerance)  . Phenergan [Promethazine Hcl] Other (See Comments)  Other reaction(s): Delusions (intolerance) Muscular seizures "muscle twitches"  . Promethazine Rash and Other (See Comments)    Muscular seizures  . Sulfa Antibiotics Hives and Other (See Comments)    Hematemesis; documented while a young child     Constitutional: Denies fever, malaise, fatigue, headache or abrupt weight changes.   GU: Pt reports urgency, frequency and pain with urination. Denies burning sensation, blood in urine, odor or discharge.   No other specific complaints in a complete review of systems (except as listed in HPI above).    Objective:   Physical Exam  BP 130/88 (BP Location: Left Arm, Patient Position: Sitting)   Pulse 98   Temp 98.3 F (36.8 C) (Oral)   Wt 148  lb (67.1 kg)   SpO2 97%   BMI 25.01 kg/m   Wt Readings from Last 3 Encounters:  03/20/17 145 lb 3.2 oz (65.9 kg)  03/04/17 146 lb (66.2 kg)  02/28/17 144 lb 8 oz (65.5 kg)    General: Appears her stated age, well developed, well nourished in NAD. Abdomen: Soft. Normal bowel sounds. No distention or masses noted.  No CVA tenderness.       Assessment & Plan:   Urgency, Frequency, Dysuria:  ? Interstitial Cystitis Urinalysis: normal Will send urine culture OK to take AZO OTC Drink plenty of fluids  RTC as needed or if symptoms persist. Webb Silversmith, NP

## 2017-04-29 NOTE — Addendum Note (Signed)
Addended by: Magdalen Spatz C on: 04/29/2017 04:49 PM   Modules accepted: Orders

## 2017-05-01 LAB — URINE CULTURE: Organism ID, Bacteria: NO GROWTH

## 2017-05-10 ENCOUNTER — Emergency Department: Payer: BC Managed Care – PPO

## 2017-05-10 ENCOUNTER — Emergency Department
Admission: EM | Admit: 2017-05-10 | Discharge: 2017-05-10 | Disposition: A | Discharge status: Home / Self Care | Payer: BC Managed Care – PPO | Attending: Emergency Medicine | Admitting: Emergency Medicine

## 2017-05-10 DIAGNOSIS — Y939 Activity, unspecified: Secondary | ICD-10-CM | POA: Diagnosis not present

## 2017-05-10 DIAGNOSIS — S39012A Strain of muscle, fascia and tendon of lower back, initial encounter: Secondary | ICD-10-CM | POA: Insufficient documentation

## 2017-05-10 DIAGNOSIS — J45909 Unspecified asthma, uncomplicated: Secondary | ICD-10-CM | POA: Diagnosis not present

## 2017-05-10 DIAGNOSIS — S161XXA Strain of muscle, fascia and tendon at neck level, initial encounter: Secondary | ICD-10-CM | POA: Diagnosis not present

## 2017-05-10 DIAGNOSIS — S0990XA Unspecified injury of head, initial encounter: Secondary | ICD-10-CM | POA: Diagnosis present

## 2017-05-10 DIAGNOSIS — Y92410 Unspecified street and highway as the place of occurrence of the external cause: Secondary | ICD-10-CM | POA: Insufficient documentation

## 2017-05-10 DIAGNOSIS — M79675 Pain in left toe(s): Secondary | ICD-10-CM | POA: Diagnosis not present

## 2017-05-10 DIAGNOSIS — I10 Essential (primary) hypertension: Secondary | ICD-10-CM | POA: Insufficient documentation

## 2017-05-10 DIAGNOSIS — R0789 Other chest pain: Secondary | ICD-10-CM | POA: Diagnosis not present

## 2017-05-10 DIAGNOSIS — S060X1A Concussion with loss of consciousness of 30 minutes or less, initial encounter: Secondary | ICD-10-CM | POA: Insufficient documentation

## 2017-05-10 DIAGNOSIS — Y999 Unspecified external cause status: Secondary | ICD-10-CM | POA: Insufficient documentation

## 2017-05-10 DIAGNOSIS — Z79899 Other long term (current) drug therapy: Secondary | ICD-10-CM | POA: Diagnosis not present

## 2017-05-10 MED ORDER — FENTANYL CITRATE (PF) 100 MCG/2ML IJ SOLN
50.0000 ug | Freq: Once | INTRAMUSCULAR | Status: AC
  Administered 2017-05-10: 50 ug via INTRAVENOUS
  Filled 2017-05-10: qty 2

## 2017-05-10 MED ORDER — TRAMADOL HCL 50 MG PO TABS: 50.0000 mg | ORAL_TABLET | Freq: Four times a day (QID) | ORAL | 0 refills | Status: DC | PRN

## 2017-05-10 NOTE — ED Notes (Signed)
ED Provider at bedside. 

## 2017-05-10 NOTE — ED Notes (Signed)
Pt returned from CT/XR
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
## 2017-05-14 ENCOUNTER — Ambulatory Visit: Payer: BC Managed Care – PPO | Admitting: Internal Medicine

## 2017-05-14 ENCOUNTER — Encounter: Payer: Self-pay | Admitting: Internal Medicine

## 2017-05-15 ENCOUNTER — Other Ambulatory Visit: Payer: Self-pay | Admitting: Gastroenterology

## 2017-05-16 NOTE — Telephone Encounter (Signed)
Yes you can refill it. She is due for repeat labs in June - July time frame (CBC, CMET). Thanks

## 2017-05-16 NOTE — Telephone Encounter (Signed)
Pt was last seen in clinic this past Feb. She does not have a f/u scheduled at this time. Are you ok to refill Imuran at this time?

## 2017-05-17 ENCOUNTER — Other Ambulatory Visit: Payer: Self-pay

## 2017-05-17 ENCOUNTER — Telehealth: Payer: Self-pay

## 2017-05-17 DIAGNOSIS — K50119 Crohn's disease of large intestine with unspecified complications: Secondary | ICD-10-CM

## 2017-05-17 NOTE — Telephone Encounter (Signed)
Imuran refilled today. CBC, CMET order placed. Reminder letter mailed to pt to come for lab work in June or July this year.

## 2017-05-17 NOTE — Progress Notes (Unsigned)
cbc

## 2017-05-29 ENCOUNTER — Other Ambulatory Visit: Payer: Self-pay | Admitting: Family Medicine

## 2017-05-30 NOTE — Telephone Encounter (Signed)
Last office visit 04/29/2017 with R. Baity.  Last refilled 03/28/2017 for #30 with no refills.  Ok to refill?

## 2017-05-31 ENCOUNTER — Encounter: Payer: Self-pay | Admitting: Family Medicine

## 2017-05-31 ENCOUNTER — Encounter (HOSPITAL_COMMUNITY): Payer: BC Managed Care – PPO

## 2017-05-31 NOTE — Telephone Encounter (Signed)
Alprazolam called into [facility], [place] Phone: [phone]

## 2017-06-06 ENCOUNTER — Encounter: Payer: Self-pay | Admitting: Internal Medicine

## 2017-06-06 ENCOUNTER — Ambulatory Visit (INDEPENDENT_AMBULATORY_CARE_PROVIDER_SITE_OTHER): Payer: BC Managed Care – PPO | Admitting: Internal Medicine

## 2017-06-06 VITALS — BP 106/80 | HR 84 | Ht 64.0 in | Wt 147.6 lb

## 2017-06-06 DIAGNOSIS — R55 Syncope and collapse: Secondary | ICD-10-CM | POA: Diagnosis not present

## 2017-06-06 NOTE — Progress Notes (Signed)
HPI [name] returns today for followup of autonomic dysfunction. She is a very pleasant 41 year old woman with a history of autonomic dysfunction, who is also known to have hyperemesis gravidarum. She has a h/o PE, and her autonomic dysfunction has been reasonably well controlled with midodrine. Previously she had an ILR placed and she would like to have it removed. She is moving to [place] and will be living in [place] beginning in October.    Allergies  Allergen Reactions  . Biaxin [Clarithromycin] Other (See Comments)    Other reaction(s): Bleeding Cant take due to Chrons disease  . Codeine Nausea And Vomiting and Other (See Comments)    Other reaction(s): Bleeding Dilaudid and demerol OK  . Compazine [Prochlorperazine Edisylate] Anaphylaxis  . Reglan [Metoclopramide] Anaphylaxis and Other (See Comments)    Other reaction(s): GI Upset (intolerance) Intensifies her Chron's  . Chlorhexidine Gluconate     Other reaction(s): Redness  . Doxycycline Nausea And Vomiting  . Other Rash    GuardIVa - PICC line antimicrobial dressing  Redness Resultant infection with PICC line x2 with irritation from adhesive "chloraprep"  . Oxycodone Nausea And Vomiting  . Sulfamethoxazole Rash and Other (See Comments)    Internal bleeding and kidney infection  . Sulfamethoxazole-Trimethoprim Other (See Comments)    "muscle twitches" Muscle spasms  . Chlorhexidine Other (See Comments)    Blisters after using a couple of times.   . Remicade [Infliximab] Other (See Comments)    Serum Sickness  Couldn't walk, open mouth, pain,   . Hydrocodone Nausea And Vomiting  . Macrobid [Nitrofurantoin] Nausea And Vomiting    Other reaction(s): GI Upset (intolerance)  . Phenergan [Promethazine Hcl] Other (See Comments)    Other reaction(s): Delusions (intolerance) Muscular seizures "muscle twitches"  . Promethazine Rash and Other (See Comments)    Muscular seizures  . Sulfa Antibiotics Hives and Other  (See Comments)    Hematemesis; documented while a young child     Current Outpatient Prescriptions  Medication Sig Dispense Refill  . acetaminophen (TYLENOL) 500 MG tablet Take 1,000 mg by mouth every 6 (six) hours as needed for headache.    . albuterol (PROVENTIL HFA;VENTOLIN HFA) 108 (90 BASE) MCG/ACT inhaler Inhale 2 puffs into the lungs every 6 (six) hours as needed for wheezing or shortness of breath. 1 Inhaler 2  . albuterol (PROVENTIL) (2.5 MG/3ML) 0.083% nebulizer solution Take 3 mLs (2.5 mg total) by nebulization every 6 (six) hours as needed for wheezing or shortness of breath. 150 mL 1  . ALPRAZolam (XANAX) 0.25 MG tablet TAKE 2 TABLETS BY MOUTH EVERY DAY AS NEEDED FOR ANXIETY 30 tablet 0  . azaTHIOprine (IMURAN) 50 MG tablet TAKE THREE TABLETS BY MOUTH DAILY AS DIRECTED 90 tablet 1  . butalbital-acetaminophen-caffeine (FIORICET, ESGIC) 50-325-40 MG tablet TAKE TWO CAPSULES BY MOUTH EVERY 6 HOURSAS NEEDED. 20 tablet 0  . Cetirizine HCl (ZYRTEC ALLERGY PO) Take 1 tablet by mouth daily.     . cyanocobalamin (,VITAMIN B-12,) 1000 MCG/ML injection Inject 1,000 mcg into the muscle every 21 ( twenty-one) days.    . cyclobenzaprine (FLEXERIL) 10 MG tablet Take 0.5-1 tablets (5-10 mg total) by mouth 3 (three) times daily as needed for muscle spasms. 30 tablet 0  . dexlansoprazole (DEXILANT) 60 MG capsule Take 1 capsule (60 mg total) by mouth daily. 30 capsule 5  . metoprolol tartrate (LOPRESSOR) 25 MG tablet Take 0.5 tablets (12.5 mg total) by mouth 2 (two) times daily. 180 tablet 3  .  midodrine (PROAMATINE) 5 MG tablet Take 1 tablet (5 mg total) by mouth 2 (two) times daily with a meal. 60 tablet 11  . montelukast (SINGULAIR) 10 MG tablet Take 1 tablet (10 mg total) by mouth at bedtime. 30 tablet 3  . PARoxetine (PAXIL) 20 MG tablet Take 1 tablet (20 mg total) by mouth daily. 30 tablet 3  . ranitidine (ZANTAC) 150 MG tablet Take 1 tablet (150 mg total) by mouth every morning. 30 tablet 3  .  traMADol (ULTRAM) 50 MG tablet Take 50 mg by mouth every 6 (six) hours as needed (pain).    . Vedolizumab (ENTYVIO IV) Inject into the vein as directed.    . zolpidem (AMBIEN) 10 MG tablet TAKE ONE TABLET BY MOUTH EVERY NIGHT AT BEDTIME 30 tablet 1   Current Facility-Administered Medications  Medication Dose Route Frequency Provider Last Rate Last Dose  . 0.9 %  sodium chloride infusion  500 mL Intravenous Continuous Armbruster, Renelda Loma, MD         Past Medical History:  Diagnosis Date  . Anxiety   . Asthma   . Chest pain    a. s/p intermediate risk nuclear stress test on 09/23/14 and normal LHC on 09/24/14   . Chronic nausea   . Crohn disease ([place]) 2000  . Dysautonomia    recurrent syncope s/p ILR by Dr Doreatha Lew  . Endometriosis   . Essential hypertension 03/17/2017   a - Renal artery [name] 3/18: normal    . GERD (gastroesophageal reflux disease)   . H/O hiatal hernia   . Migraine   . Palpitations   . Pulmonary embolism affecting pregnancy   . Vertigo     ROS:   All systems reviewed and negative except as noted in the HPI.   Past Surgical History:  Procedure Laterality Date  . [place] STUDY N/A 12/31/2016   Procedure: [place] STUDY;  Surgeon: Manus Gunning, MD;  [facility];  Service: Gastroenterology;  Laterality: N/A;  . APPENDECTOMY  ~ 1992  . CESAREAN SECTION  2011   emergency  CS due to placental abruption  . CESAREAN SECTION  2014   "preeclampsia"  . COLON SURGERY    . CYSTOSCOPY W/ STONE MANIPULATION  X 2  . ENDOSCOPIC RELEASE TRANSVERSE CARPAL LIGAMENT OF HAND Right 12/2010  . ESOPHAGEAL MANOMETRY N/A 12/31/2016   Procedure: ESOPHAGEAL MANOMETRY (EM);  Surgeon: Manus Gunning, MD;  Location: [facility];  Service: Gastroenterology;  Laterality: N/A;  . ILEOCECETOMY  2002 X 2  . KNEE ARTHROSCOPY Bilateral 1988-1990  . LAPAROSCOPY FOR ECTOPIC PREGNANCY  11/2012  . LEFT HEART CATHETERIZATION WITH CORONARY ANGIOGRAM N/A 09/24/2014    Procedure: LEFT HEART CATHETERIZATION WITH CORONARY ANGIOGRAM;  Surgeon: Leonie Man, MD;  Location: [facility] CATH LAB;  Service: Cardiovascular;  Laterality: N/A;  . LOOP RECORDER IMPLANT  11/2009   by DR Doreatha Lew  . NASAL SINUS SURGERY  ~ 2007  . PARTIAL COLECTOMY  2002   partial removed due to crohns  . TONSILLECTOMY  ~ 1996  . US ECHOCARDIOGRAPHY  11/11/2009   EF 55-60%     Family History  Problem Relation Age of Onset  . Hypertension Mother   . Hyperlipidemia Mother   . Arthritis Mother   . Diabetes Father   . Coronary artery disease Father   . Heart disease Father 65       MI age 24  . Hyperlipidemia Brother   . Hypertension Brother   .  Colon cancer Paternal Grandmother      Social History   Social History  . Marital status: Married    Spouse name: [name]  . Number of children: 2  . Years of education: college   Occupational History  .  Other    Airline pilot  .  [place] History Main Topics  . Smoking status: Never Smoker  . Smokeless tobacco: Never Used  . Alcohol use Yes  . Drug use: No  . Sexual activity: Yes    Birth control/ protection: Surgical   Other Topics Concern  . Not on file   Social History Narrative   Patient lives at home with her husband [name]). Patient works full time for [employer].    Education [employer].   Right handed.   Caffeine one cup of coffee daily.     BP 106/80   Pulse 84   Ht 5' 4"  (1.626 m)   Wt 147 lb 9.6 oz (67 kg)   LMP 05/09/2017   SpO2 99%   BMI 25.34 kg/m   Physical Exam:  Stable appearing 41 year old woman, NAD HEENT: Unremarkable Neck:  6 cm JVD, no thyromegally Back:  No CVA tenderness Lungs:  Clear with no wheezes, rales, or rhonchi. Indwelling PICC line in left arm. HEART:  Regular rate rhythm, no murmurs, no rubs, no clicks Abd:  soft, positive bowel sounds, no organomegally, no rebound, no guarding Ext:  2 plus pulses, no edema, no cyanosis, no  clubbing Skin:  No rashes no nodules Neuro:  CN II through XII intact, motor grossly intact   Assess/Plan: 1. Recurrent syncope in the setting of known autonomic dysfunction - She will continue her midodrine. She has done very well over the past 3 months despite increased stress 2. HTN - her blood pressure was high on florinef. It is much better on midodrine 3. H/o pulmonary embolism - she has had none of the same symptoms she had with her PE. Will not plan any additional eval at this time.  4. Crohn disease - her symptoms have been fairly well controlled on with Entyvio.  Mikle Bosworth.D.
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
## 2017-06-07 ENCOUNTER — Encounter: Payer: Self-pay | Admitting: Family Medicine

## 2017-06-11 ENCOUNTER — Encounter: Payer: Self-pay | Admitting: Internal Medicine

## 2017-06-12 ENCOUNTER — Other Ambulatory Visit: Payer: Self-pay | Admitting: Neurosurgery

## 2017-06-12 ENCOUNTER — Other Ambulatory Visit: Payer: Self-pay | Admitting: Family Medicine

## 2017-06-17 ENCOUNTER — Other Ambulatory Visit: Payer: Self-pay | Admitting: Family Medicine

## 2017-06-17 NOTE — Telephone Encounter (Signed)
Received refill electronically Last refill 04/19/17 #30/1 Last office visit 05/09/17/acute

## 2017-06-18 ENCOUNTER — Telehealth: Payer: Self-pay

## 2017-06-18 NOTE — Telephone Encounter (Signed)
Received incoming call. Pt wants to cancel LINQ removal on Friday (06/21/17). Pt wants to reschedule for 07/05/17.   Rescheduled LINQ removal for 07/05/17 at 9:30 AM, pt arriving to the hospital at 8:30 AM. Pt verbalized understanding and thanked me for my assistance.

## 2017-06-19 NOTE — Telephone Encounter (Signed)
Rx called to pharmacy as instructed. 

## 2017-06-28 ENCOUNTER — Encounter: Payer: Self-pay | Admitting: Internal Medicine

## 2017-07-03 ENCOUNTER — Other Ambulatory Visit: Payer: Self-pay | Admitting: Family Medicine

## 2017-07-03 NOTE — Telephone Encounter (Signed)
Last office visit 04/29/2017 with R. Baity.  Last refilled 05/31/2017 for #30 with no refills.  Ok to refill?

## 2017-07-04 NOTE — Telephone Encounter (Signed)
Alprazolam called into [place], [place] A Phone: [phone]

## 2017-07-05 ENCOUNTER — Encounter (HOSPITAL_COMMUNITY): Payer: Self-pay | Admitting: *Deleted

## 2017-07-05 ENCOUNTER — Ambulatory Visit (HOSPITAL_COMMUNITY)
Admission: RE | Admit: 2017-07-05 | Discharge: 2017-07-05 | Disposition: A | Discharge status: Home / Self Care | Payer: BC Managed Care – PPO | Source: Ambulatory Visit | Attending: Internal Medicine | Admitting: Internal Medicine

## 2017-07-05 ENCOUNTER — Encounter (HOSPITAL_COMMUNITY)
Admission: RE | Disposition: A | Discharge status: Home / Self Care | Payer: Self-pay | Source: Ambulatory Visit | Attending: Internal Medicine

## 2017-07-05 DIAGNOSIS — Z833 Family history of diabetes mellitus: Secondary | ICD-10-CM | POA: Diagnosis not present

## 2017-07-05 DIAGNOSIS — Z888 Allergy status to other drugs, medicaments and biological substances status: Secondary | ICD-10-CM | POA: Insufficient documentation

## 2017-07-05 DIAGNOSIS — Z7951 Long term (current) use of inhaled steroids: Secondary | ICD-10-CM | POA: Diagnosis not present

## 2017-07-05 DIAGNOSIS — I10 Essential (primary) hypertension: Secondary | ICD-10-CM | POA: Insufficient documentation

## 2017-07-05 DIAGNOSIS — G901 Familial dysautonomia [Riley-Day]: Secondary | ICD-10-CM | POA: Diagnosis not present

## 2017-07-05 DIAGNOSIS — Z9049 Acquired absence of other specified parts of digestive tract: Secondary | ICD-10-CM | POA: Insufficient documentation

## 2017-07-05 DIAGNOSIS — K5090 Crohn's disease, unspecified, without complications: Secondary | ICD-10-CM | POA: Insufficient documentation

## 2017-07-05 DIAGNOSIS — Z8261 Family history of arthritis: Secondary | ICD-10-CM | POA: Insufficient documentation

## 2017-07-05 DIAGNOSIS — Z885 Allergy status to narcotic agent status: Secondary | ICD-10-CM | POA: Insufficient documentation

## 2017-07-05 DIAGNOSIS — F419 Anxiety disorder, unspecified: Secondary | ICD-10-CM | POA: Insufficient documentation

## 2017-07-05 DIAGNOSIS — K449 Diaphragmatic hernia without obstruction or gangrene: Secondary | ICD-10-CM | POA: Diagnosis not present

## 2017-07-05 DIAGNOSIS — Z882 Allergy status to sulfonamides status: Secondary | ICD-10-CM | POA: Insufficient documentation

## 2017-07-05 DIAGNOSIS — K219 Gastro-esophageal reflux disease without esophagitis: Secondary | ICD-10-CM | POA: Insufficient documentation

## 2017-07-05 DIAGNOSIS — Z86711 Personal history of pulmonary embolism: Secondary | ICD-10-CM | POA: Insufficient documentation

## 2017-07-05 DIAGNOSIS — Z4509 Encounter for adjustment and management of other cardiac device: Secondary | ICD-10-CM | POA: Diagnosis present

## 2017-07-05 DIAGNOSIS — Z8349 Family history of other endocrine, nutritional and metabolic diseases: Secondary | ICD-10-CM | POA: Diagnosis not present

## 2017-07-05 DIAGNOSIS — Z9889 Other specified postprocedural states: Secondary | ICD-10-CM | POA: Diagnosis not present

## 2017-07-05 DIAGNOSIS — J45909 Unspecified asthma, uncomplicated: Secondary | ICD-10-CM | POA: Insufficient documentation

## 2017-07-05 DIAGNOSIS — Z8249 Family history of ischemic heart disease and other diseases of the circulatory system: Secondary | ICD-10-CM | POA: Insufficient documentation

## 2017-07-05 DIAGNOSIS — Z87442 Personal history of urinary calculi: Secondary | ICD-10-CM | POA: Diagnosis not present

## 2017-07-05 DIAGNOSIS — G43909 Migraine, unspecified, not intractable, without status migrainosus: Secondary | ICD-10-CM | POA: Diagnosis not present

## 2017-07-05 DIAGNOSIS — Z800 Family history of malignant neoplasm of digestive organs: Secondary | ICD-10-CM | POA: Insufficient documentation

## 2017-07-05 DIAGNOSIS — Z79899 Other long term (current) drug therapy: Secondary | ICD-10-CM | POA: Insufficient documentation

## 2017-07-05 DIAGNOSIS — R55 Syncope and collapse: Secondary | ICD-10-CM | POA: Diagnosis not present

## 2017-07-05 DIAGNOSIS — Z881 Allergy status to other antibiotic agents status: Secondary | ICD-10-CM | POA: Insufficient documentation

## 2017-07-05 HISTORY — PX: LOOP RECORDER REMOVAL: EP1215

## 2017-07-05 SURGERY — LOOP RECORDER REMOVAL

## 2017-07-05 MED ORDER — LIDOCAINE-EPINEPHRINE 1 %-1:100000 IJ SOLN
INTRAMUSCULAR | Status: DC | PRN
  Administered 2017-07-05: 30 mL

## 2017-07-05 MED ORDER — LIDOCAINE-EPINEPHRINE 1 %-1:100000 IJ SOLN
INTRAMUSCULAR | Status: AC
  Filled 2017-07-05: qty 1

## 2017-07-05 SURGICAL SUPPLY — 1 items: PACK LOOP INSERTION (CUSTOM PROCEDURE TRAY) ×3 IMPLANT

## 2017-07-05 NOTE — Interval H&P Note (Signed)
History and Physical Interval Note:  07/05/2017 12:19 PM  [name]  has presented today for surgery, with the diagnosis of rrt  The various methods of treatment have been discussed with the patient and family. After consideration of risks, benefits and other options for treatment, the patient has consented to  Procedure(s): Loop Recorder Removal (N/A) as a surgical intervention .  The patient's history has been reviewed, patient examined, no change in status, stable for surgery.  I have reviewed the patient's chart and labs.  Questions were answered to the patient's satisfaction.     [name]

## 2017-07-05 NOTE — H&P (View-Only) (Signed)
HPI [name] returns today for followup of autonomic dysfunction. She is a very pleasant 41 year old woman with a history of autonomic dysfunction, who is also known to have hyperemesis gravidarum. She has a h/o PE, and her autonomic dysfunction has been reasonably well controlled with midodrine. Previously she had an ILR placed and she would like to have it removed. She is moving to [place] and will be living in [place] beginning in October.    Allergies  Allergen Reactions  . Biaxin [Clarithromycin] Other (See Comments)    Other reaction(s): Bleeding Cant take due to Chrons disease  . Codeine Nausea And Vomiting and Other (See Comments)    Other reaction(s): Bleeding Dilaudid and demerol OK  . Compazine [Prochlorperazine Edisylate] Anaphylaxis  . Reglan [Metoclopramide] Anaphylaxis and Other (See Comments)    Other reaction(s): GI Upset (intolerance) Intensifies her Chron's  . Chlorhexidine Gluconate     Other reaction(s): Redness  . Doxycycline Nausea And Vomiting  . Other Rash    GuardIVa - PICC line antimicrobial dressing  Redness Resultant infection with PICC line x2 with irritation from adhesive "chloraprep"  . Oxycodone Nausea And Vomiting  . Sulfamethoxazole Rash and Other (See Comments)    Internal bleeding and kidney infection  . Sulfamethoxazole-Trimethoprim Other (See Comments)    "muscle twitches" Muscle spasms  . Chlorhexidine Other (See Comments)    Blisters after using a couple of times.   . Remicade [Infliximab] Other (See Comments)    Serum Sickness  Couldn't walk, open mouth, pain,   . Hydrocodone Nausea And Vomiting  . Macrobid [Nitrofurantoin] Nausea And Vomiting    Other reaction(s): GI Upset (intolerance)  . Phenergan [Promethazine Hcl] Other (See Comments)    Other reaction(s): Delusions (intolerance) Muscular seizures "muscle twitches"  . Promethazine Rash and Other (See Comments)    Muscular seizures  . Sulfa Antibiotics Hives and Other  (See Comments)    Hematemesis; documented while a young child     Current Outpatient Prescriptions  Medication Sig Dispense Refill  . acetaminophen (TYLENOL) 500 MG tablet Take 1,000 mg by mouth every 6 (six) hours as needed for headache.    . albuterol (PROVENTIL HFA;VENTOLIN HFA) 108 (90 BASE) MCG/ACT inhaler Inhale 2 puffs into the lungs every 6 (six) hours as needed for wheezing or shortness of breath. 1 Inhaler 2  . albuterol (PROVENTIL) (2.5 MG/3ML) 0.083% nebulizer solution Take 3 mLs (2.5 mg total) by nebulization every 6 (six) hours as needed for wheezing or shortness of breath. 150 mL 1  . ALPRAZolam (XANAX) 0.25 MG tablet TAKE 2 TABLETS BY MOUTH EVERY DAY AS NEEDED FOR ANXIETY 30 tablet 0  . azaTHIOprine (IMURAN) 50 MG tablet TAKE THREE TABLETS BY MOUTH DAILY AS DIRECTED 90 tablet 1  . butalbital-acetaminophen-caffeine (FIORICET, ESGIC) 50-325-40 MG tablet TAKE TWO CAPSULES BY MOUTH EVERY 6 HOURSAS NEEDED. 20 tablet 0  . Cetirizine HCl (ZYRTEC ALLERGY PO) Take 1 tablet by mouth daily.     . cyanocobalamin (,VITAMIN B-12,) 1000 MCG/ML injection Inject 1,000 mcg into the muscle every 21 ( twenty-one) days.    . cyclobenzaprine (FLEXERIL) 10 MG tablet Take 0.5-1 tablets (5-10 mg total) by mouth 3 (three) times daily as needed for muscle spasms. 30 tablet 0  . dexlansoprazole (DEXILANT) 60 MG capsule Take 1 capsule (60 mg total) by mouth daily. 30 capsule 5  . metoprolol tartrate (LOPRESSOR) 25 MG tablet Take 0.5 tablets (12.5 mg total) by mouth 2 (two) times daily. 180 tablet 3  .  midodrine (PROAMATINE) 5 MG tablet Take 1 tablet (5 mg total) by mouth 2 (two) times daily with a meal. 60 tablet 11  . montelukast (SINGULAIR) 10 MG tablet Take 1 tablet (10 mg total) by mouth at bedtime. 30 tablet 3  . PARoxetine (PAXIL) 20 MG tablet Take 1 tablet (20 mg total) by mouth daily. 30 tablet 3  . ranitidine (ZANTAC) 150 MG tablet Take 1 tablet (150 mg total) by mouth every morning. 30 tablet 3  .  traMADol (ULTRAM) 50 MG tablet Take 50 mg by mouth every 6 (six) hours as needed (pain).    . Vedolizumab (ENTYVIO IV) Inject into the vein as directed.    . zolpidem (AMBIEN) 10 MG tablet TAKE ONE TABLET BY MOUTH EVERY NIGHT AT BEDTIME 30 tablet 1   Current Facility-Administered Medications  Medication Dose Route Frequency Provider Last Rate Last Dose  . 0.9 %  sodium chloride infusion  500 mL Intravenous Continuous Armbruster, Renelda Loma, MD         Past Medical History:  Diagnosis Date  . Anxiety   . Asthma   . Chest pain    a. s/p intermediate risk nuclear stress test on 09/23/14 and normal LHC on 09/24/14   . Chronic nausea   . Crohn disease ([place]) 2000  . Dysautonomia    recurrent syncope s/p ILR by Dr Doreatha Lew  . Endometriosis   . Essential hypertension 03/17/2017   a - Renal artery [name] 3/18: normal    . GERD (gastroesophageal reflux disease)   . H/O hiatal hernia   . Migraine   . Palpitations   . Pulmonary embolism affecting pregnancy   . Vertigo     ROS:   All systems reviewed and negative except as noted in the HPI.   Past Surgical History:  Procedure Laterality Date  . [place] STUDY N/A 12/31/2016   Procedure: [place] STUDY;  Surgeon: Manus Gunning, MD;  Location: [facility];  Service: Gastroenterology;  Laterality: N/A;  . APPENDECTOMY  ~ 1992  . CESAREAN SECTION  2011   emergency  CS due to placental abruption  . CESAREAN SECTION  2014   "preeclampsia"  . COLON SURGERY    . CYSTOSCOPY W/ STONE MANIPULATION  X 2  . ENDOSCOPIC RELEASE TRANSVERSE CARPAL LIGAMENT OF HAND Right 12/2010  . ESOPHAGEAL MANOMETRY N/A 12/31/2016   Procedure: ESOPHAGEAL MANOMETRY (EM);  Surgeon: Manus Gunning, MD;  Location: [facility];  Service: Gastroenterology;  Laterality: N/A;  . ILEOCECETOMY  2002 X 2  . KNEE ARTHROSCOPY Bilateral 1988-1990  . LAPAROSCOPY FOR ECTOPIC PREGNANCY  11/2012  . LEFT HEART CATHETERIZATION WITH CORONARY ANGIOGRAM N/A 09/24/2014    Procedure: LEFT HEART CATHETERIZATION WITH CORONARY ANGIOGRAM;  Surgeon: Leonie Man, MD;  Location: [facility] CATH LAB;  Service: Cardiovascular;  Laterality: N/A;  . LOOP RECORDER IMPLANT  11/2009   by DR Doreatha Lew  . NASAL SINUS SURGERY  ~ 2007  . PARTIAL COLECTOMY  2002   partial removed due to crohns  . TONSILLECTOMY  ~ 1996  . US ECHOCARDIOGRAPHY  11/11/2009   EF 55-60%     Family History  Problem Relation Age of Onset  . Hypertension Mother   . Hyperlipidemia Mother   . Arthritis Mother   . Diabetes Father   . Coronary artery disease Father   . Heart disease Father 33       MI age 70  . Hyperlipidemia Brother   . Hypertension Brother   .  Colon cancer Paternal Grandmother      Social History   Social History  . Marital status: Married    Spouse name: Jonni Sanger  . Number of children: 2  . Years of education: college   Occupational History  .  Other    Airline pilot  .  [place] History Main Topics  . Smoking status: Never Smoker  . Smokeless tobacco: Never Used  . Alcohol use Yes  . Drug use: No  . Sexual activity: Yes    Birth control/ protection: Surgical   Other Topics Concern  . Not on file   Social History Narrative   Patient lives at home with her husband Jonni Sanger). Patient works full time for [employer].    Education [employer].   Right handed.   Caffeine one cup of coffee daily.     BP 106/80   Pulse 84   Ht 5' 4"  (1.626 m)   Wt 147 lb 9.6 oz (67 kg)   LMP 05/09/2017   SpO2 99%   BMI 25.34 kg/m   Physical Exam:  Stable appearing 41 year old woman, NAD HEENT: Unremarkable Neck:  6 cm JVD, no thyromegally Back:  No CVA tenderness Lungs:  Clear with no wheezes, rales, or rhonchi. Indwelling PICC line in left arm. HEART:  Regular rate rhythm, no murmurs, no rubs, no clicks Abd:  soft, positive bowel sounds, no organomegally, no rebound, no guarding Ext:  2 plus pulses, no edema, no cyanosis, no  clubbing Skin:  No rashes no nodules Neuro:  CN II through XII intact, motor grossly intact   Assess/Plan: 1. Recurrent syncope in the setting of known autonomic dysfunction - She will continue her midodrine. She has done very well over the past 3 months despite increased stress 2. HTN - her blood pressure was high on florinef. It is much better on midodrine 3. H/o pulmonary embolism - she has had none of the same symptoms she had with her PE. Will not plan any additional eval at this time.  4. Crohn disease - her symptoms have been fairly well controlled on with Entyvio.  Mikle Bosworth.D.

## 2017-07-08 ENCOUNTER — Encounter: Payer: Self-pay | Admitting: Gastroenterology

## 2017-07-11 ENCOUNTER — Other Ambulatory Visit (HOSPITAL_COMMUNITY): Payer: Self-pay | Admitting: *Deleted

## 2017-07-15 ENCOUNTER — Other Ambulatory Visit: Payer: Self-pay | Admitting: Family Medicine

## 2017-07-15 ENCOUNTER — Other Ambulatory Visit: Payer: Self-pay | Admitting: Gastroenterology

## 2017-07-16 ENCOUNTER — Encounter (HOSPITAL_COMMUNITY): Payer: Self-pay

## 2017-07-16 ENCOUNTER — Encounter (HOSPITAL_COMMUNITY)
Admission: RE | Admit: 2017-07-16 | Discharge: 2017-07-16 | Disposition: A | Discharge status: Home / Self Care | Payer: BC Managed Care – PPO | Source: Ambulatory Visit | Attending: Neurosurgery | Admitting: Neurosurgery

## 2017-07-16 DIAGNOSIS — F419 Anxiety disorder, unspecified: Secondary | ICD-10-CM

## 2017-07-16 DIAGNOSIS — G901 Familial dysautonomia [Riley-Day]: Secondary | ICD-10-CM | POA: Insufficient documentation

## 2017-07-16 DIAGNOSIS — I10 Essential (primary) hypertension: Secondary | ICD-10-CM | POA: Diagnosis not present

## 2017-07-16 DIAGNOSIS — M50122 Cervical disc disorder at C5-C6 level with radiculopathy: Secondary | ICD-10-CM | POA: Diagnosis not present

## 2017-07-16 DIAGNOSIS — K5090 Crohn's disease, unspecified, without complications: Secondary | ICD-10-CM

## 2017-07-16 DIAGNOSIS — K219 Gastro-esophageal reflux disease without esophagitis: Secondary | ICD-10-CM

## 2017-07-16 DIAGNOSIS — M542 Cervicalgia: Secondary | ICD-10-CM | POA: Diagnosis present

## 2017-07-16 DIAGNOSIS — M4722 Other spondylosis with radiculopathy, cervical region: Secondary | ICD-10-CM | POA: Diagnosis not present

## 2017-07-16 DIAGNOSIS — K449 Diaphragmatic hernia without obstruction or gangrene: Secondary | ICD-10-CM

## 2017-07-16 DIAGNOSIS — M4802 Spinal stenosis, cervical region: Secondary | ICD-10-CM | POA: Diagnosis not present

## 2017-07-16 DIAGNOSIS — J45909 Unspecified asthma, uncomplicated: Secondary | ICD-10-CM

## 2017-07-16 DIAGNOSIS — Z01812 Encounter for preprocedural laboratory examination: Secondary | ICD-10-CM | POA: Insufficient documentation

## 2017-07-16 DIAGNOSIS — Z79899 Other long term (current) drug therapy: Secondary | ICD-10-CM | POA: Diagnosis not present

## 2017-07-16 LAB — CBC
HEMATOCRIT: 38.4 % (ref 36.0–46.0)
Hemoglobin: 12.6 g/dL (ref 12.0–15.0)
MCH: 29.5 pg (ref 26.0–34.0)
MCHC: 32.8 g/dL (ref 30.0–36.0)
MCV: 89.9 fL (ref 78.0–100.0)
PLATELETS: 378 10*3/uL (ref 150–400)
RBC: 4.27 MIL/uL (ref 3.87–5.11)
RDW: 13.1 % (ref 11.5–15.5)
WBC: 8.5 10*3/uL (ref 4.0–10.5)

## 2017-07-16 LAB — BASIC METABOLIC PANEL
Anion gap: 6 (ref 5–15)
BUN: 9 mg/dL (ref 6–20)
CHLORIDE: 106 mmol/L (ref 101–111)
CO2: 26 mmol/L (ref 22–32)
CREATININE: 0.64 mg/dL (ref 0.44–1.00)
Calcium: 9 mg/dL (ref 8.9–10.3)
GFR calc Af Amer: >60 mL/min (ref >60)
GFR calc non Af Amer: >60 mL/min (ref >60)
Glucose, Bld: 110 mg/dL — ABNORMAL HIGH (ref 65–99)
Potassium: 3.8 mmol/L (ref 3.5–5.1)
Sodium: 138 mmol/L (ref 135–145)

## 2017-07-16 LAB — TYPE AND SCREEN
ABO/RH(D): A POS
ANTIBODY SCREEN: NEGATIVE

## 2017-07-16 LAB — HCG, SERUM, QUALITATIVE: Preg, Serum: NEGATIVE

## 2017-07-16 LAB — SURGICAL PCR SCREEN
MRSA, PCR: NEGATIVE
Staphylococcus aureus: NEGATIVE

## 2017-07-16 LAB — ABO/RH: ABO/RH(D): A POS

## 2017-07-16 NOTE — Progress Notes (Signed)
PCP: Dr. Diona Browner Cardiologist: Dr. Lovena Le  EKG: 04/2017 ECHO: 02/2017 Stress Test: 02/2017 Cardiac Cath: 09/2014  Instructed patient to shower night before surgery and morning of surgery with Dial soap due to severe CHG allergy.  Patient denies shortness of breath, fever, cough, and chest pain at PAT appointment.  Patient verbalized understanding of instructions provided today at the PAT appointment.  Patient asked to review instructions at home and day of surgery.

## 2017-07-16 NOTE — Pre-Procedure Instructions (Addendum)
[name]  07/16/2017      [facility] - [place], [place] - [address] [name] [address] Phone: [phone] Fax: [phone]  [place], [place] [address] Phone: [phone] Fax: [phone]  [facility] - [place], [place] [facility] [address] [place] [address] Phone: [phone] Fax: [phone]    Your procedure is scheduled on July 19, 2017.  Report to [facility] Admitting at 10:30 A.M.  Call this number if you have problems the morning of surgery:  [phone]   Call [phone] if you have any questions prior to your surgery date Monday-Friday 8am-4pm   Remember:  Do not eat food or drink liquids after midnight.  Take these medicines the morning of surgery with A SIP OF WATER cetirizine (Zyrtec), metoprolol (Lopressor), alprazolam (Xanax) if needed, acetaminophen (Tylenol) if needed, albuterol inhaler if needed (bring with you), midodrine (Proamatine), paroxetine (Paxil)  7 days prior to surgery STOP taking any Aspirin, Aleve, Naproxen, Ibuprofen, Motrin, Advil, Goody's, BC's, all herbal medications, fish oil, and all vitamins   Do not wear jewelry, make-up or nail polish.  Do not wear lotions, powders, or perfumes, or deoderant.  Do not shave 48 hours prior to surgery.   Do not bring valuables to the hospital.   [facility] is not responsible for any belongings or valuables.  Contacts, dentures or bridgework may not be worn into surgery.  Leave your suitcase in the car.  After surgery it may be brought to your room.  For patients admitted to the hospital, discharge time will be determined by your treatment team.  Patients discharged the day of surgery will not be allowed to drive home.   Special instructions:   [facility]- Preparing For Surgery  Before surgery, you can play an  important role. Because skin is not sterile, your skin needs to be as free of germs as possible. You can reduce the number of germs on your skin by washing with CHG (chlorahexidine gluconate) Soap before surgery.  CHG is an antiseptic cleaner which kills germs and bonds with the skin to continue killing germs even after washing.  Please do not use if you have an allergy to CHG or antibacterial soaps. If your skin becomes reddened/irritated stop using the CHG.  Do not shave (including legs and underarms) for at least 48 hours prior to first CHG shower. It is OK to shave your face.  Please follow these instructions carefully.   1. Shower the NIGHT BEFORE SURGERY and the MORNING OF SURGERY with CHG.   2. If you chose to wash your hair, wash your hair first as usual with your normal shampoo.  3. After you shampoo, rinse your hair and body thoroughly to remove the shampoo.  4. Use CHG as you would any other liquid soap. You can apply CHG directly to the skin and wash gently with a scrungie or a clean washcloth.   5. Apply the CHG Soap to your body ONLY FROM THE NECK DOWN.  Do not use on open wounds or open sores. Avoid contact with your eyes, ears, mouth and genitals (private parts). Wash genitals (private parts) with your normal soap.  6. Wash thoroughly, paying special attention to the area where your surgery will be performed.  7. Thoroughly rinse your body with warm water from the neck down.  8.  DO NOT shower/wash with your normal soap after using and rinsing off the CHG Soap.  9. Pat yourself dry with a CLEAN TOWEL.   10. Wear CLEAN PAJAMAS   11. Place CLEAN SHEETS on your bed the night of your first shower and DO NOT SLEEP WITH PETS.    Day of Surgery: Do not apply any deodorants/lotions. Please wear clean clothes to the hospital/surgery center.      Please read over the following fact sheets that you were given. Pain Booklet, Coughing and Deep Breathing, MRSA Information and  Surgical Site Infection Prevention

## 2017-07-16 NOTE — Telephone Encounter (Signed)
Are you ok to refill Imuran?

## 2017-07-16 NOTE — Telephone Encounter (Signed)
Zolpidem called into [place], [place] A Phone: [phone]

## 2017-07-16 NOTE — Telephone Encounter (Signed)
Last office visit 04/29/2017 with R. Baity.  Last refilled 06/18/2017 for #30 with no refills.  Ok to refill?

## 2017-07-17 ENCOUNTER — Other Ambulatory Visit: Payer: Self-pay

## 2017-07-17 DIAGNOSIS — K50119 Crohn's disease of large intestine with unspecified complications: Secondary | ICD-10-CM

## 2017-07-17 NOTE — Progress Notes (Unsigned)
lft

## 2017-07-17 NOTE — Progress Notes (Signed)
Anesthesia chart review: Patient is a 41 year old female scheduled for C5-6 ACDF on 07/19/2017 by Dr. Vertell Limber.  History includes never smoker, postoperative N/V, Crohn's disease s/p ileocecectomy X2 '02, chronic nausea, GERD, hiatal hernia, hypertension, PE, asthma, dysautonomia with recurrent syncope (s/p loop recorder 12/13/09; removal 07/05/17 by Dr. Cristopher Peru), palpitations, endometriosis, anxiety, vertigo, migraines, chest pain 2015 (normal cath), appendectomy, tonsillectomy. Notes indicate that she will be moving to [place] in October.  PCP is listed as Dr. Eliezer Lofts.  Meds include albuterol, Xanax, Fioricet, Zyrtec, Dexilant, Lopressor, midodrine (for autonomic dysfunction), Singulair, Paxil, Ambien, vedolizumab.   BP (!) 139/92   Pulse 77   Temp 36.5 C   Resp 18   Ht 5' 4.5" (1.638 m)   Wt 149 lb 6.4 oz (67.8 kg)   LMP 07/02/2017 (Exact Date)   SpO2 100%   BMI 25.25 kg/m   EKG 05/20/17: SR.  ETT 03/14/17:  Blood pressure demonstrated a normal response to exercise.  There was no ST segment deviation noted during stress. Normal ETT  Echo 03/14/17: Study Conclusions - Left ventricle: The cavity size was normal. Systolic function was   normal. The estimated ejection fraction was in the range of 55%   to 60%. Wall motion was normal; there were no regional wall   motion abnormalities. Left ventricular diastolic function   parameters were normal.  LHC 09/24/14:   Angiographically normal coronary arteries with a right dominant system.  No evidence of any culprit lesion to explain the abnormal Myoview. This would be considered a false positive Myoview stress test likely related to breast attenuation.  Normal LV function with normal LVEDP.  Event monitor 09/2014 showed SR, ST.  CXR 05/10/17: IMPRESSION: No active cardiopulmonary disease.  Preoperative labs noted. Serum pregnancy test negative.   If no acute changes then I anticipate that she can proceed. She does have  known autonomic dysfunction.  George Hugh [facility] Short Stay Center/Anesthesiology Phone [phone] 07/17/2017 6:07 PM

## 2017-07-17 NOTE — Telephone Encounter (Signed)
Yes you can refill but she is in need of LFTs the next time she goes to the lab if you can help order. thanks

## 2017-07-18 MED ORDER — CEFAZOLIN SODIUM-DEXTROSE 2-4 GM/100ML-% IV SOLN
2.0000 g | INTRAVENOUS | Status: AC
  Administered 2017-07-19: 2 g via INTRAVENOUS
  Filled 2017-07-18: qty 100
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
## 2017-07-19 ENCOUNTER — Encounter (HOSPITAL_COMMUNITY): Payer: Self-pay

## 2017-07-19 ENCOUNTER — Encounter (HOSPITAL_COMMUNITY)
Admission: AD | Disposition: A | Discharge status: Home / Self Care | Payer: Self-pay | Source: Ambulatory Visit | Attending: Neurosurgery

## 2017-07-19 ENCOUNTER — Ambulatory Visit (HOSPITAL_COMMUNITY): Payer: BC Managed Care – PPO

## 2017-07-19 ENCOUNTER — Observation Stay (HOSPITAL_COMMUNITY)
Admission: AD | Admit: 2017-07-19 | Discharge: 2017-07-20 | DRG: 472 | Disposition: A | Discharge status: Home / Self Care | Payer: BC Managed Care – PPO | Source: Ambulatory Visit | Attending: Neurosurgery | Admitting: Neurosurgery

## 2017-07-19 ENCOUNTER — Ambulatory Visit (HOSPITAL_COMMUNITY): Payer: BC Managed Care – PPO | Admitting: Vascular Surgery

## 2017-07-19 ENCOUNTER — Ambulatory Visit (HOSPITAL_COMMUNITY): Payer: BC Managed Care – PPO | Admitting: Anesthesiology

## 2017-07-19 DIAGNOSIS — M50122 Cervical disc disorder at C5-C6 level with radiculopathy: Principal | ICD-10-CM | POA: Insufficient documentation

## 2017-07-19 DIAGNOSIS — M5020 Other cervical disc displacement, unspecified cervical region: Secondary | ICD-10-CM | POA: Diagnosis present

## 2017-07-19 DIAGNOSIS — M4802 Spinal stenosis, cervical region: Secondary | ICD-10-CM | POA: Insufficient documentation

## 2017-07-19 DIAGNOSIS — M4722 Other spondylosis with radiculopathy, cervical region: Secondary | ICD-10-CM | POA: Insufficient documentation

## 2017-07-19 DIAGNOSIS — J45909 Unspecified asthma, uncomplicated: Secondary | ICD-10-CM | POA: Insufficient documentation

## 2017-07-19 DIAGNOSIS — K5090 Crohn's disease, unspecified, without complications: Secondary | ICD-10-CM | POA: Insufficient documentation

## 2017-07-19 DIAGNOSIS — Z79899 Other long term (current) drug therapy: Secondary | ICD-10-CM | POA: Insufficient documentation

## 2017-07-19 DIAGNOSIS — Z419 Encounter for procedure for purposes other than remedying health state, unspecified: Secondary | ICD-10-CM

## 2017-07-19 DIAGNOSIS — I10 Essential (primary) hypertension: Secondary | ICD-10-CM | POA: Insufficient documentation

## 2017-07-19 HISTORY — PX: ANTERIOR CERVICAL DECOMP/DISCECTOMY FUSION: SHX1161

## 2017-07-19 SURGERY — ANTERIOR CERVICAL DECOMPRESSION/DISCECTOMY FUSION 1 LEVEL
Anesthesia: General

## 2017-07-19 MED ORDER — SODIUM CHLORIDE 0.9% FLUSH: 3.0000 mL | Freq: Two times a day (BID) | INTRAVENOUS | Status: DC

## 2017-07-19 MED ORDER — ONDANSETRON HCL 4 MG/2ML IJ SOLN
4.0000 mg | Freq: Once | INTRAMUSCULAR | Status: AC | PRN
  Administered 2017-07-19: 4 mg via INTRAVENOUS

## 2017-07-19 MED ORDER — MONTELUKAST SODIUM 10 MG PO TABS
10.0000 mg | ORAL_TABLET | Freq: Every day | ORAL | Status: DC
  Administered 2017-07-19: 10 mg via ORAL
  Filled 2017-07-19: qty 1

## 2017-07-19 MED ORDER — METHOCARBAMOL 500 MG PO TABS
500.0000 mg | ORAL_TABLET | Freq: Four times a day (QID) | ORAL | Status: DC | PRN
  Administered 2017-07-19: 500 mg via ORAL

## 2017-07-19 MED ORDER — KCL IN DEXTROSE-NACL 20-5-0.45 MEQ/L-%-% IV SOLN: INTRAVENOUS | Status: DC

## 2017-07-19 MED ORDER — ZOLPIDEM TARTRATE 5 MG PO TABS
10.0000 mg | ORAL_TABLET | Freq: Every day | ORAL | Status: DC
  Administered 2017-07-19: 10 mg via ORAL
  Filled 2017-07-19: qty 2

## 2017-07-19 MED ORDER — MIDAZOLAM HCL 2 MG/2ML IJ SOLN
INTRAMUSCULAR | Status: AC
  Filled 2017-07-19: qty 2

## 2017-07-19 MED ORDER — ONDANSETRON HCL 4 MG/2ML IJ SOLN
INTRAMUSCULAR | Status: AC
  Filled 2017-07-19: qty 2

## 2017-07-19 MED ORDER — MORPHINE SULFATE (PF) 4 MG/ML IV SOLN
1.0000 mg | INTRAVENOUS | Status: DC | PRN
  Administered 2017-07-19 (×2): 1 mg via INTRAVENOUS
  Filled 2017-07-19 (×2): qty 1

## 2017-07-19 MED ORDER — ONDANSETRON HCL 4 MG/2ML IJ SOLN: 4.0000 mg | Freq: Four times a day (QID) | INTRAMUSCULAR | Status: DC | PRN

## 2017-07-19 MED ORDER — SODIUM CHLORIDE 0.9 % IV SOLN: 250.0000 mL | INTRAVENOUS | Status: DC

## 2017-07-19 MED ORDER — LACTATED RINGERS IV SOLN
INTRAVENOUS | Status: DC
  Administered 2017-07-19 (×2): via INTRAVENOUS

## 2017-07-19 MED ORDER — FENTANYL CITRATE (PF) 100 MCG/2ML IJ SOLN
INTRAMUSCULAR | Status: AC
  Administered 2017-07-19: 50 ug via INTRAVENOUS
  Filled 2017-07-19: qty 2

## 2017-07-19 MED ORDER — PHENOL 1.4 % MT LIQD: 1.0000 | OROMUCOSAL | Status: DC | PRN

## 2017-07-19 MED ORDER — GELATIN ABSORBABLE MT POWD
OROMUCOSAL | Status: DC | PRN
  Administered 2017-07-19: 15:00:00 via TOPICAL

## 2017-07-19 MED ORDER — BUPIVACAINE HCL (PF) 0.5 % IJ SOLN
INTRAMUSCULAR | Status: AC
  Filled 2017-07-19: qty 30

## 2017-07-19 MED ORDER — ALUM & MAG HYDROXIDE-SIMETH 200-200-20 MG/5ML PO SUSP: 30.0000 mL | Freq: Four times a day (QID) | ORAL | Status: DC | PRN

## 2017-07-19 MED ORDER — HYDROMORPHONE HCL 2 MG PO TABS
2.0000 mg | ORAL_TABLET | ORAL | Status: DC | PRN
  Administered 2017-07-19 – 2017-07-20 (×4): 2 mg via ORAL
  Filled 2017-07-19 (×4): qty 1

## 2017-07-19 MED ORDER — ALBUTEROL SULFATE (2.5 MG/3ML) 0.083% IN NEBU: 3.0000 mL | INHALATION_SOLUTION | Freq: Four times a day (QID) | RESPIRATORY_TRACT | Status: DC | PRN

## 2017-07-19 MED ORDER — PAROXETINE HCL 20 MG PO TABS
20.0000 mg | ORAL_TABLET | Freq: Every day | ORAL | Status: DC
  Administered 2017-07-19: 20 mg via ORAL
  Filled 2017-07-19: qty 1

## 2017-07-19 MED ORDER — CYCLOBENZAPRINE HCL 10 MG PO TABS
10.0000 mg | ORAL_TABLET | Freq: Three times a day (TID) | ORAL | Status: DC | PRN
  Administered 2017-07-19 – 2017-07-20 (×2): 10 mg via ORAL
  Filled 2017-07-19 (×2): qty 1

## 2017-07-19 MED ORDER — ACETAMINOPHEN 325 MG PO TABS: 650.0000 mg | ORAL_TABLET | ORAL | Status: DC | PRN

## 2017-07-19 MED ORDER — MIDODRINE HCL 5 MG PO TABS
5.0000 mg | ORAL_TABLET | Freq: Two times a day (BID) | ORAL | Status: DC
  Filled 2017-07-19 (×2): qty 1

## 2017-07-19 MED ORDER — SENNOSIDES-DOCUSATE SODIUM 8.6-50 MG PO TABS: 1.0000 | ORAL_TABLET | Freq: Every evening | ORAL | Status: DC | PRN

## 2017-07-19 MED ORDER — DOCUSATE SODIUM 100 MG PO CAPS
100.0000 mg | ORAL_CAPSULE | Freq: Two times a day (BID) | ORAL | Status: DC
  Administered 2017-07-19 – 2017-07-20 (×2): 100 mg via ORAL
  Filled 2017-07-19 (×2): qty 1

## 2017-07-19 MED ORDER — FENTANYL CITRATE (PF) 250 MCG/5ML IJ SOLN
INTRAMUSCULAR | Status: DC | PRN
  Administered 2017-07-19 (×2): 50 ug via INTRAVENOUS
  Administered 2017-07-19: 100 ug via INTRAVENOUS
  Administered 2017-07-19: 50 ug via INTRAVENOUS

## 2017-07-19 MED ORDER — ZOLPIDEM TARTRATE 5 MG PO TABS: 5.0000 mg | ORAL_TABLET | Freq: Every evening | ORAL | Status: DC | PRN

## 2017-07-19 MED ORDER — THROMBIN 5000 UNITS EX SOLR
CUTANEOUS | Status: AC
  Filled 2017-07-19: qty 15000

## 2017-07-19 MED ORDER — PANTOPRAZOLE SODIUM 40 MG IV SOLR: 40.0000 mg | Freq: Every day | INTRAVENOUS | Status: DC

## 2017-07-19 MED ORDER — ONDANSETRON HCL 4 MG/2ML IJ SOLN
INTRAMUSCULAR | Status: DC | PRN
  Administered 2017-07-19: 4 mg via INTRAVENOUS

## 2017-07-19 MED ORDER — METHOCARBAMOL 1000 MG/10ML IJ SOLN: 500.0000 mg | Freq: Four times a day (QID) | INTRAMUSCULAR | Status: DC | PRN

## 2017-07-19 MED ORDER — 0.9 % SODIUM CHLORIDE (POUR BTL) OPTIME
TOPICAL | Status: DC | PRN
  Administered 2017-07-19: 1000 mL

## 2017-07-19 MED ORDER — PROPOFOL 10 MG/ML IV BOLUS
INTRAVENOUS | Status: DC | PRN
  Administered 2017-07-19: 170 mg via INTRAVENOUS

## 2017-07-19 MED ORDER — BUTALBITAL-APAP-CAFFEINE 50-325-40 MG PO TABS: 1.0000 | ORAL_TABLET | ORAL | Status: DC | PRN

## 2017-07-19 MED ORDER — THROMBIN 5000 UNITS EX SOLR
CUTANEOUS | Status: DC | PRN
  Administered 2017-07-19: 10000 [IU] via TOPICAL

## 2017-07-19 MED ORDER — METHOCARBAMOL 500 MG PO TABS
ORAL_TABLET | ORAL | Status: AC
  Administered 2017-07-19: 500 mg via ORAL
  Filled 2017-07-19: qty 1

## 2017-07-19 MED ORDER — BISACODYL 10 MG RE SUPP: 10.0000 mg | Freq: Every day | RECTAL | Status: DC | PRN

## 2017-07-19 MED ORDER — LIDOCAINE-EPINEPHRINE 1 %-1:100000 IJ SOLN
INTRAMUSCULAR | Status: DC | PRN
  Administered 2017-07-19: 3.5 mL

## 2017-07-19 MED ORDER — MENTHOL 3 MG MT LOZG: 1.0000 | LOZENGE | OROMUCOSAL | Status: DC | PRN

## 2017-07-19 MED ORDER — CYANOCOBALAMIN 1000 MCG/ML IJ SOLN: 1000.0000 ug | INTRAMUSCULAR | Status: DC

## 2017-07-19 MED ORDER — CEFAZOLIN SODIUM-DEXTROSE 2-4 GM/100ML-% IV SOLN
2.0000 g | Freq: Three times a day (TID) | INTRAVENOUS | Status: AC
  Administered 2017-07-19 – 2017-07-20 (×2): 2 g via INTRAVENOUS
  Filled 2017-07-19 (×2): qty 100

## 2017-07-19 MED ORDER — FENTANYL CITRATE (PF) 100 MCG/2ML IJ SOLN
25.0000 ug | INTRAMUSCULAR | Status: DC | PRN
  Administered 2017-07-19 (×3): 50 ug via INTRAVENOUS

## 2017-07-19 MED ORDER — LIDOCAINE-EPINEPHRINE 1 %-1:100000 IJ SOLN
INTRAMUSCULAR | Status: AC
  Filled 2017-07-19: qty 1

## 2017-07-19 MED ORDER — SODIUM CHLORIDE 0.9% FLUSH: 3.0000 mL | INTRAVENOUS | Status: DC | PRN

## 2017-07-19 MED ORDER — ROCURONIUM BROMIDE 10 MG/ML (PF) SYRINGE
PREFILLED_SYRINGE | INTRAVENOUS | Status: DC | PRN
  Administered 2017-07-19 (×2): 50 mg via INTRAVENOUS

## 2017-07-19 MED ORDER — FLEET ENEMA 7-19 GM/118ML RE ENEM: 1.0000 | ENEMA | Freq: Once | RECTAL | Status: DC | PRN

## 2017-07-19 MED ORDER — ACETAMINOPHEN 500 MG PO TABS: 1000.0000 mg | ORAL_TABLET | Freq: Four times a day (QID) | ORAL | Status: DC | PRN

## 2017-07-19 MED ORDER — FENTANYL CITRATE (PF) 250 MCG/5ML IJ SOLN
INTRAMUSCULAR | Status: AC
  Filled 2017-07-19: qty 5

## 2017-07-19 MED ORDER — LORATADINE 10 MG PO TABS
10.0000 mg | ORAL_TABLET | Freq: Every day | ORAL | Status: DC
  Administered 2017-07-20: 10 mg via ORAL
  Filled 2017-07-19: qty 1

## 2017-07-19 MED ORDER — DEXAMETHASONE SODIUM PHOSPHATE 10 MG/ML IJ SOLN
INTRAMUSCULAR | Status: DC | PRN
  Administered 2017-07-19: 10 mg via INTRAVENOUS

## 2017-07-19 MED ORDER — HEMOSTATIC AGENTS (NO CHARGE) OPTIME
TOPICAL | Status: DC | PRN
  Administered 2017-07-19: 1 via TOPICAL

## 2017-07-19 MED ORDER — MIDAZOLAM HCL 2 MG/2ML IJ SOLN
INTRAMUSCULAR | Status: DC | PRN
  Administered 2017-07-19: 2 mg via INTRAVENOUS

## 2017-07-19 MED ORDER — ALPRAZOLAM 0.25 MG PO TABS: 0.2500 mg | ORAL_TABLET | Freq: Two times a day (BID) | ORAL | Status: DC | PRN

## 2017-07-19 MED ORDER — ONDANSETRON HCL 4 MG PO TABS
4.0000 mg | ORAL_TABLET | Freq: Four times a day (QID) | ORAL | Status: DC | PRN
Start: 2017-07-19 — End: 2017-07-20

## 2017-07-19 MED ORDER — PHENYLEPHRINE HCL 10 MG/ML IJ SOLN
INTRAVENOUS | Status: DC | PRN
  Administered 2017-07-19: 20 ug/min via INTRAVENOUS

## 2017-07-19 MED ORDER — ALBUTEROL SULFATE (2.5 MG/3ML) 0.083% IN NEBU: 2.5000 mg | INHALATION_SOLUTION | Freq: Four times a day (QID) | RESPIRATORY_TRACT | Status: DC | PRN

## 2017-07-19 MED ORDER — LIDOCAINE 2% (20 MG/ML) 5 ML SYRINGE
INTRAMUSCULAR | Status: DC | PRN
  Administered 2017-07-19: 40 mg via INTRAVENOUS

## 2017-07-19 MED ORDER — PANTOPRAZOLE SODIUM 40 MG PO TBEC
40.0000 mg | DELAYED_RELEASE_TABLET | Freq: Every day | ORAL | Status: DC
  Administered 2017-07-20: 40 mg via ORAL
  Filled 2017-07-19: qty 1

## 2017-07-19 MED ORDER — BUPIVACAINE HCL (PF) 0.5 % IJ SOLN
INTRAMUSCULAR | Status: DC | PRN
  Administered 2017-07-19: 3.5 mL

## 2017-07-19 MED ORDER — SUGAMMADEX SODIUM 200 MG/2ML IV SOLN
INTRAVENOUS | Status: DC | PRN
  Administered 2017-07-19: 300 mg via INTRAVENOUS

## 2017-07-19 MED ORDER — ACETAMINOPHEN 650 MG RE SUPP: 650.0000 mg | RECTAL | Status: DC | PRN

## 2017-07-19 SURGICAL SUPPLY — 70 items
ADH SKN CLS APL DERMABOND .7 (GAUZE/BANDAGES/DRESSINGS) ×1
BASKET BONE COLLECTION (BASKET) ×3 IMPLANT
BIT DRILL 12X2.5XAVTR (BIT) IMPLANT
BIT DRILL AVIATOR 12 (BIT) ×2
BIT DRILL AVIATOR 12MM (BIT) ×1
BIT DRILL NEURO 2X3.1 SFT TUCH (MISCELLANEOUS) ×1 IMPLANT
BIT DRL 12X2.5XAVTR (BIT) ×1
BLADE ULTRA TIP 2M (BLADE) IMPLANT
BNDG GAUZE ELAST 4 BULKY (GAUZE/BANDAGES/DRESSINGS) IMPLANT
BUR BARREL STRAIGHT FLUTE 4.0 (BURR) ×3 IMPLANT
CANISTER SUCT 3000ML PPV (MISCELLANEOUS) ×3 IMPLANT
CARTRIDGE OIL MAESTRO DRILL (MISCELLANEOUS) ×1 IMPLANT
COVER MAYO STAND STRL (DRAPES) ×3 IMPLANT
DECANTER SPIKE VIAL GLASS SM (MISCELLANEOUS) ×3 IMPLANT
DERMABOND ADVANCED (GAUZE/BANDAGES/DRESSINGS) ×2
DERMABOND ADVANCED .7 DNX12 (GAUZE/BANDAGES/DRESSINGS) ×1 IMPLANT
DIFFUSER DRILL AIR PNEUMATIC (MISCELLANEOUS) ×3 IMPLANT
DRAPE HALF SHEET 40X57 (DRAPES) IMPLANT
DRAPE LAPAROTOMY 100X72 PEDS (DRAPES) ×3 IMPLANT
DRAPE MICROSCOPE LEICA (MISCELLANEOUS) ×3 IMPLANT
DRAPE POUCH INSTRU U-SHP 10X18 (DRAPES) ×3 IMPLANT
DRILL NEURO 2X3.1 SOFT TOUCH (MISCELLANEOUS) ×3
DRSG OPSITE POSTOP 3X4 (GAUZE/BANDAGES/DRESSINGS) ×2 IMPLANT
DURAPREP 6ML APPLICATOR 50/CS (WOUND CARE) ×3 IMPLANT
ELECT COATED BLADE 2.86 ST (ELECTRODE) ×3 IMPLANT
ELECT REM PT RETURN 9FT ADLT (ELECTROSURGICAL) ×3
ELECTRODE REM PT RTRN 9FT ADLT (ELECTROSURGICAL) ×1 IMPLANT
GAUZE SPONGE 4X4 12PLY STRL (GAUZE/BANDAGES/DRESSINGS) IMPLANT
GAUZE SPONGE 4X4 16PLY XRAY LF (GAUZE/BANDAGES/DRESSINGS) IMPLANT
GLOVE BIO SURGEON STRL SZ8 (GLOVE) ×3 IMPLANT
GLOVE BIOGEL PI IND STRL 8 (GLOVE) ×1 IMPLANT
GLOVE BIOGEL PI IND STRL 8.5 (GLOVE) ×1 IMPLANT
GLOVE BIOGEL PI INDICATOR 8 (GLOVE) ×2
GLOVE BIOGEL PI INDICATOR 8.5 (GLOVE) ×2
GLOVE ECLIPSE 8.0 STRL XLNG CF (GLOVE) ×3 IMPLANT
GLOVE EXAM NITRILE LRG STRL (GLOVE) IMPLANT
GLOVE EXAM NITRILE XL STR (GLOVE) IMPLANT
GLOVE EXAM NITRILE XS STR PU (GLOVE) IMPLANT
GOWN STRL REUS W/ TWL LRG LVL3 (GOWN DISPOSABLE) IMPLANT
GOWN STRL REUS W/ TWL XL LVL3 (GOWN DISPOSABLE) IMPLANT
GOWN STRL REUS W/TWL 2XL LVL3 (GOWN DISPOSABLE) IMPLANT
GOWN STRL REUS W/TWL LRG LVL3 (GOWN DISPOSABLE)
GOWN STRL REUS W/TWL XL LVL3 (GOWN DISPOSABLE)
HALTER HD/CHIN CERV TRACTION D (MISCELLANEOUS) ×3 IMPLANT
HEMOSTAT POWDER KIT SURGIFOAM (HEMOSTASIS) ×2 IMPLANT
KIT BASIN OR (CUSTOM PROCEDURE TRAY) ×3 IMPLANT
KIT ROOM TURNOVER OR (KITS) ×3 IMPLANT
NDL HYPO 18GX1.5 BLUNT FILL (NEEDLE) IMPLANT
NDL HYPO 25X1 1.5 SAFETY (NEEDLE) ×1 IMPLANT
NDL SPNL 22GX3.5 QUINCKE BK (NEEDLE) ×1 IMPLANT
NEEDLE HYPO 18GX1.5 BLUNT FILL (NEEDLE) IMPLANT
NEEDLE HYPO 25X1 1.5 SAFETY (NEEDLE) ×3 IMPLANT
NEEDLE SPNL 22GX3.5 QUINCKE BK (NEEDLE) ×3 IMPLANT
NS IRRIG 1000ML POUR BTL (IV SOLUTION) ×3 IMPLANT
OIL CARTRIDGE MAESTRO DRILL (MISCELLANEOUS) ×3
PACK LAMINECTOMY NEURO (CUSTOM PROCEDURE TRAY) ×3 IMPLANT
PAD ARMBOARD 7.5X6 YLW CONV (MISCELLANEOUS) ×7 IMPLANT
PEEK AVS AS 6X12X4 (Peek) ×2 IMPLANT
PIN DISTRACTION 14MM (PIN) ×6 IMPLANT
PLATE AVIATOR ASSY 1LVL SZ 12 (Plate) ×2 IMPLANT
RUBBERBAND STERILE (MISCELLANEOUS) ×6 IMPLANT
SCREW AVIATOR VAR SELFTAP 4X12 (Screw) ×8 IMPLANT
SPONGE INTESTINAL PEANUT (DISPOSABLE) ×3 IMPLANT
SPONGE SURGIFOAM ABS GEL SZ50 (HEMOSTASIS) ×3 IMPLANT
STAPLER SKIN PROX WIDE 3.9 (STAPLE) IMPLANT
SUT VIC AB 3-0 SH 8-18 (SUTURE) ×6 IMPLANT
SYR 3ML LL SCALE MARK (SYRINGE) IMPLANT
TOWEL GREEN STERILE (TOWEL DISPOSABLE) ×3 IMPLANT
TOWEL GREEN STERILE FF (TOWEL DISPOSABLE) ×3 IMPLANT
WATER STERILE IRR 1000ML POUR (IV SOLUTION) ×3 IMPLANT

## 2017-07-19 NOTE — Interval H&P Note (Signed)
History and Physical Interval Note:  07/19/2017 12:24 PM  [name]  has presented today for surgery, with the diagnosis of Cervical herniated disc  The various methods of treatment have been discussed with the patient and family. After consideration of risks, benefits and other options for treatment, the patient has consented to  Procedure(s) with comments: C5-6 Anterior cervical decompression/discectomy/fusion (N/A) - C5-6 Anterior cervical decompression/discectomy/fusion as a surgical intervention .  The patient's history has been reviewed, patient examined, no change in status, stable for surgery.  I have reviewed the patient's chart and labs.  Questions were answered to the patient's satisfaction.     [name] D

## 2017-07-19 NOTE — Progress Notes (Signed)
Orthopedic Tech Progress Note Patient Details:  [name] 1976-01-02 [id number]  Ortho Devices Type of Ortho Device: Soft collar Ortho Device/Splint Location: neck Ortho Device/Splint Interventions: Ordered, Application   Braulio Bosch 07/19/2017, 6:01 PM

## 2017-07-19 NOTE — Progress Notes (Signed)
Awake, alert, conversant.  MAEW with full strength bilateral biceps, triceps, HI's.  Doing well.  MInimal pain.

## 2017-07-19 NOTE — Brief Op Note (Signed)
07/19/2017  3:29 PM  PATIENT:  [name]  41 y.o. female  PRE-OPERATIVE DIAGNOSIS:  Cervical herniated disc C 56 with radiculopathy, spondylosis, stenosis, cervicalgia  POST-OPERATIVE DIAGNOSIS:   Cervical herniated disc C 56 with radiculopathy, spondylosis, stenosis, cervicalgia  PROCEDURE:  Procedure(s) with comments: Cervical five-six Anterior cervical decompression/discectomy/fusion (N/A) - C5-6 Anterior cervical decompression/discectomy/fusion with PEEK cage, autograft, plate  SURGEON:  Surgeon(s) and Role:    Erline Levine, MD - Primary  PHYSICIAN ASSISTANT:   ASSISTANTS: Poteat, RN   ANESTHESIA:   general  EBL:  Total I/O In: 1000 [I.V.:1000] Out: 30 [Blood:30]  BLOOD ADMINISTERED:none  DRAINS: none   LOCAL MEDICATIONS USED:  MARCAINE    and LIDOCAINE   SPECIMEN:  No Specimen  DISPOSITION OF SPECIMEN:  N/A  COUNTS:  YES  TOURNIQUET:  * No tourniquets in log *  DICTATION: Patient is 41 year old female with herniated cervical disc C 56, stenosis, cervical radiculopathy, cervicalgia.  It was elected to take her to surgery for anterior cervical decompression and fusion C 56 level.  PROCEDURE: Patient was brought to operating room and following the smooth and uncomplicated induction of general endotracheal anesthesia her head was placed on a horseshoe head holder she was placed in 5 pounds of Holter traction and her anterior neck was prepped and draped in usual sterile fashion. An incision was made on the left side of midline after infiltrating the skin and subcutaneous tissues with local lidocaine. The platysmal layer was incised and subplatysmal dissection was performed exposing the anterior border sternocleidomastoid muscle. Using blunt dissection the carotid sheath was kept lateral and trachea and esophagus kept medial exposing the anterior cervical spine. A bent spinal needle was placed it was felt to be the C 56 level and this was confirmed on intraoperative  x-ray. Longus coli muscles were taken down from the anterior cervical spine using electrocautery and key elevator and self-retaining retractor was placed exposing the C 56 level. The interspace was incised and a thorough discectomy was performed. Distraction pins were placed. Uncinate spurs and central spondylitic ridges were drilled down with a high-speed drill. The spinal cord dura and both C 6 nerve roots were widely decompressed. Hemostasis was assured. After trial sizing a 6 mm lordotic small footprint PEEK cage was selected and packed with local autograft. This was tamped into position and countersunk appropriately. Distraction weight was removed. A 12 mm Aviator anterior cervical plate was affixed to the cervical spine with 12 mm variable-angle screws 2 at C5, 2 at C6. All screws were well-positioned and locking mechanisms were engaged. A final X ray was obtained which showed well positioned graft and anterior plate without complicating features. Soft tissues were inspected and found to be in good repair. The wound was irrigated. The platysma layer was closed with 3-0 Vicryl stitches and the skin was reapproximated with 3-0 Vicryl subcuticular stitches. The wound was dressed with Dermabond. Counts were correct at the end of the case. Patient was extubated and taken to recovery in stable and satisfactory condition.   PLAN OF CARE: Admit to inpatient   PATIENT DISPOSITION:  PACU - hemodynamically stable.   Delay start of Pharmacological VTE agent (>24hrs) due to surgical blood loss or risk of bleeding: yes

## 2017-07-19 NOTE — Op Note (Signed)
07/19/2017  3:29 PM  PATIENT:  [name]  41 y.o. female  PRE-OPERATIVE DIAGNOSIS:  Cervical herniated disc C 56 with radiculopathy, spondylosis, stenosis, cervicalgia  POST-OPERATIVE DIAGNOSIS:   Cervical herniated disc C 56 with radiculopathy, spondylosis, stenosis, cervicalgia  PROCEDURE:  Procedure(s) with comments: Cervical five-six Anterior cervical decompression/discectomy/fusion (N/A) - C5-6 Anterior cervical decompression/discectomy/fusion with PEEK cage, autograft, plate  SURGEON:  Surgeon(s) and Role:    Erline Levine, MD - Primary  PHYSICIAN ASSISTANT:   ASSISTANTS: Poteat, RN   ANESTHESIA:   general  EBL:  Total I/O In: 1000 [I.V.:1000] Out: 30 [Blood:30]  BLOOD ADMINISTERED:none  DRAINS: none   LOCAL MEDICATIONS USED:  MARCAINE    and LIDOCAINE   SPECIMEN:  No Specimen  DISPOSITION OF SPECIMEN:  N/A  COUNTS:  YES  TOURNIQUET:  * No tourniquets in log *  DICTATION: Patient is 41 year old female with herniated cervical disc C 56, stenosis, cervical radiculopathy, cervicalgia.  It was elected to take her to surgery for anterior cervical decompression and fusion C 56 level.  PROCEDURE: Patient was brought to operating room and following the smooth and uncomplicated induction of general endotracheal anesthesia her head was placed on a horseshoe head holder she was placed in 5 pounds of Holter traction and her anterior neck was prepped and draped in usual sterile fashion. An incision was made on the left side of midline after infiltrating the skin and subcutaneous tissues with local lidocaine. The platysmal layer was incised and subplatysmal dissection was performed exposing the anterior border sternocleidomastoid muscle. Using blunt dissection the carotid sheath was kept lateral and trachea and esophagus kept medial exposing the anterior cervical spine. A bent spinal needle was placed it was felt to be the C 56 level and this was confirmed on intraoperative  x-ray. Longus coli muscles were taken down from the anterior cervical spine using electrocautery and key elevator and self-retaining retractor was placed exposing the C 56 level. The interspace was incised and a thorough discectomy was performed. Distraction pins were placed. Uncinate spurs and central spondylitic ridges were drilled down with a high-speed drill. The spinal cord dura and both C 6 nerve roots were widely decompressed. Hemostasis was assured. After trial sizing a 6 mm lordotic small footprint PEEK cage was selected and packed with local autograft. This was tamped into position and countersunk appropriately. Distraction weight was removed. A 12 mm Aviator anterior cervical plate was affixed to the cervical spine with 12 mm variable-angle screws 2 at C5, 2 at C6. All screws were well-positioned and locking mechanisms were engaged. A final X ray was obtained which showed well positioned graft and anterior plate without complicating features. Soft tissues were inspected and found to be in good repair. The wound was irrigated. The platysma layer was closed with 3-0 Vicryl stitches and the skin was reapproximated with 3-0 Vicryl subcuticular stitches. The wound was dressed with Dermabond. Counts were correct at the end of the case. Patient was extubated and taken to recovery in stable and satisfactory condition.   PLAN OF CARE: Admit to inpatient   PATIENT DISPOSITION:  PACU - hemodynamically stable.   Delay start of Pharmacological VTE agent (>24hrs) due to surgical blood loss or risk of bleeding: yes

## 2017-07-19 NOTE — Anesthesia Procedure Notes (Signed)
Procedure Name: Intubation Date/Time: 07/19/2017 2:06 PM Performed by: Mervyn Gay Pre-anesthesia Checklist: Patient identified, Patient being monitored, Timeout performed, Emergency Drugs available and Suction available Patient Re-evaluated:Patient Re-evaluated prior to induction Oxygen Delivery Method: Circle System Utilized Preoxygenation: Pre-oxygenation with 100% oxygen Induction Type: IV induction Ventilation: Mask ventilation without difficulty Laryngoscope Size: Miller and 3 Grade View: Grade I Tube type: Oral Tube size: 7.0 mm Number of attempts: 1 Airway Equipment and Method: Stylet Placement Confirmation: ETT inserted through vocal cords under direct vision,  positive ETCO2 and breath sounds checked- equal and bilateral Secured at: 21 cm Tube secured with: Tape Dental Injury: Teeth and Oropharynx as per pre-operative assessment

## 2017-07-19 NOTE — Transfer of Care (Signed)
Immediate Anesthesia Transfer of Care Note  Patient: [name]  Procedure(s) Performed: Procedure(s) with comments: Cervical five-six Anterior cervical decompression/discectomy/fusion (N/A) - C5-6 Anterior cervical decompression/discectomy/fusion  Patient Location: PACU  Anesthesia Type:General  Level of Consciousness: awake, alert  and oriented  Airway & Oxygen Therapy: Patient Spontanous Breathing and Patient connected to face mask oxygen  Post-op Assessment: Report given to RN and Post -op Vital signs reviewed and stable  Post vital signs: Reviewed and stable  Last Vitals:  Vitals:   07/19/17 1048  BP: (!) 164/95  Pulse: 81  Resp: 18  Temp: 37 C    Last Pain:  Vitals:   07/19/17 1541  TempSrc:   PainSc: (P) Asleep      Patients Stated Pain Goal: 3 (93/11/21 6244)  Complications: No apparent anesthesia complications

## 2017-07-19 NOTE — Anesthesia Preprocedure Evaluation (Signed)
Anesthesia Evaluation  Patient identified by MRN, date of birth, ID band Patient awake    Reviewed: Allergy & Precautions, NPO status , Patient's Chart, lab work & pertinent test results  Airway Mallampati: II  TM Distance: >3 FB Neck ROM: Full    Dental  (+) Teeth Intact, Dental Advisory Given   Pulmonary    breath sounds clear to auscultation       Cardiovascular hypertension,  Rhythm:Regular Rate:Normal     Neuro/Psych    GI/Hepatic   Endo/Other    Renal/GU      Musculoskeletal   Abdominal   Peds  Hematology   Anesthesia Other Findings   Reproductive/Obstetrics                             Anesthesia Physical Anesthesia Plan  ASA: III  Anesthesia Plan: General   Post-op Pain Management:    Induction: Intravenous  PONV Risk Score and Plan: 1 and Ondansetron and Dexamethasone  Airway Management Planned: Oral ETT  Additional Equipment:   Intra-op Plan:   Post-operative Plan:   Informed Consent: I have reviewed the patients History and Physical, chart, labs and discussed the procedure including the risks, benefits and alternatives for the proposed anesthesia with the patient or authorized representative who has indicated his/her understanding and acceptance.   Dental advisory given  Plan Discussed with: CRNA and Anesthesiologist  Anesthesia Plan Comments:         Anesthesia Quick Evaluation

## 2017-07-20 DIAGNOSIS — M50122 Cervical disc disorder at C5-C6 level with radiculopathy: Secondary | ICD-10-CM | POA: Diagnosis not present

## 2017-07-20 MED ORDER — HYDROMORPHONE HCL 2 MG PO TABS: 2.0000 mg | ORAL_TABLET | Freq: Four times a day (QID) | ORAL | 0 refills | Status: DC | PRN

## 2017-07-20 MED ORDER — METHOCARBAMOL 500 MG PO TABS: 500.0000 mg | ORAL_TABLET | Freq: Four times a day (QID) | ORAL | 1 refills | Status: DC | PRN

## 2017-07-20 NOTE — Evaluation (Signed)
Physical Therapy Evaluation Patient Details Name: [name] MRN: [id number] DOB: Sep 12, 1976 Today's Date: 07/20/2017   History of Present Illness  41 yo female. 07/19/2017 and taken to the operating room where the patient underwent ACDF  Clinical Impression  Patient seen for mobility assessment s/p spinal surgery. Mobilizing well. Educated patient on precautions, mobility expectations, safety and car transfers. No further acute PT needs. Will sign off.     Follow Up Recommendations No PT follow up    Equipment Recommendations  None recommended by PT    Recommendations for Other Services       Precautions / Restrictions Precautions Precautions: Cervical Precaution Comments: handout provided Required Braces or Orthoses: Cervical Brace Cervical Brace: Soft collar Restrictions Weight Bearing Restrictions: No      Mobility  Bed Mobility Overal bed mobility: Modified Independent             General bed mobility comments: independent, good technique, increased time to perform  Transfers Overall transfer level: Modified independent Equipment used: None             General transfer comment: increased time to elevate to standing  Ambulation/Gait Ambulation/Gait assistance: Independent Ambulation Distance (Feet): 410 Feet Assistive device: None Gait Pattern/deviations: WFL(Within Functional Limits)        Stairs Stairs: Yes Stairs assistance: Supervision Stair Management: One rail Left;Step to pattern;Forwards Number of Stairs: 5 General stair comments: VCs for technique and sequencing  Wheelchair Mobility    Modified Rankin (Stroke Patients Only)       Balance Overall balance assessment: Independent                                           Pertinent Vitals/Pain Pain Assessment: Faces Faces Pain Scale: Hurts little more Pain Location: neck and operative site Pain Descriptors / Indicators: Sore Pain Intervention(s):  Monitored during session    Home Living Family/patient expects to be discharged to:: Private residence Living Arrangements: Spouse/significant other Available Help at Discharge: Family Type of Home: House Home Access: Level entry     Home Layout: Two level Home Equipment: None      Prior Function Level of Independence: Independent         Comments: waorks as a Engineer, technical sales Dominance   Dominant Hand: Right    Extremity/Trunk Assessment   Upper Extremity Assessment Upper Extremity Assessment: Defer to OT evaluation    Lower Extremity Assessment Lower Extremity Assessment: Overall WFL for tasks assessed    Cervical / Trunk Assessment Cervical / Trunk Assessment:  (s/p spinal surgery)  Communication   Communication: No difficulties  Cognition Arousal/Alertness: Awake/alert Behavior During Therapy: WFL for tasks assessed/performed Overall Cognitive Status: Within Functional Limits for tasks assessed                                        General Comments      Exercises     Assessment/Plan    PT Assessment Patent does not need any further PT services  PT Problem List         PT Treatment Interventions      PT Goals (Current goals can be found in the Care Plan section)  Acute Rehab PT Goals PT Goal Formulation: All assessment and education complete, DC therapy  Frequency     Barriers to discharge        Co-evaluation               AM-PAC PT "6 Clicks" Daily Activity  Outcome Measure Difficulty turning over in bed (including adjusting bedclothes, sheets and blankets)?: None Difficulty moving from lying on back to sitting on the side of the bed? : None Difficulty sitting down on and standing up from a chair with arms (e.g., wheelchair, bedside commode, etc,.)?: None Help needed moving to and from a bed to chair (including a wheelchair)?: None Help needed walking in hospital room?: None Help needed climbing 3-5 steps  with a railing? : None 6 Click Score: 24    End of Session Equipment Utilized During Treatment: Cervical collar Activity Tolerance: Patient tolerated treatment well Patient left: in bed;with call bell/phone within reach (sitting EOb) Nurse Communication: Mobility status PT Visit Diagnosis: Pain Pain - part of body:  (neck)    Time: 0518-3358 PT Time Calculation (min) (ACUTE ONLY): 19 min   Charges:   PT Evaluation $PT Eval Low Complexity: 1 Procedure     PT G Codes:   PT G-Codes **NOT FOR INPATIENT CLASS** Functional Assessment Tool Used: Clinical judgement Functional Limitation: Mobility: Walking and moving around Mobility: Walking and Moving Around Current Status (I5189): 0 percent impaired, limited or restricted Mobility: Walking and Moving Around Goal Status (Q4210): 0 percent impaired, limited or restricted Mobility: Walking and Moving Around Discharge Status (Z1281): 0 percent impaired, limited or restricted    Alben Deeds, PT DPT  Board Certified Neurologic Specialist [phone]   Duncan Dull 07/20/2017, 8:07 AM

## 2017-07-20 NOTE — Progress Notes (Signed)
Patient alert and oriented, mae's well, voiding adequate amount of urine, swallowing without difficulty, c/o mild pain at time of discharge. Patient discharged home with family. Script and discharged instructions given to patient. Patient and family stated understanding of instructions given. Patient has an appointment with Dr. Vertell Limber.

## 2017-07-20 NOTE — Evaluation (Signed)
Occupational Therapy Evaluation and Discharge Patient Details Name: [name] MRN: [id number] DOB: 1976-02-26 Today's Date: 07/20/2017    History of Present Illness 41 yo female. 07/19/2017 and taken to the operating room where the patient underwent ACDF   Clinical Impression   PTA Pt independent in ADL and mobility. Pt currently modified independent in ADL and mobility. Cervical handout provided and reviewed adls in detail. Pt educated on: donning/doff brace, sequencing for dressing, set an alarm at night for medication, correct bed positioning for sleeping, correct sequence for bed mobility, AE to assist with LB bathing, and never wash directly over incision. (See ADL section for addditional information) All education is complete and patient indicates understanding. OT to sign off, thank you for this referral.       Follow Up Recommendations  No OT follow up;Supervision - Intermittent    Equipment Recommendations  Other (comment) (long handle sponge)    Recommendations for Other Services       Precautions / Restrictions Precautions Precautions: Cervical Precaution Comments: handout provided and reviewed in full Required Braces or Orthoses: Cervical Brace Cervical Brace: Soft collar Restrictions Weight Bearing Restrictions: No      Mobility Bed Mobility Overal bed mobility: Modified Independent             General bed mobility comments: independent, good technique, increased time to perform  Transfers Overall transfer level: Modified independent Equipment used: None             General transfer comment: increased time to elevate to standing    Balance Overall balance assessment: Independent                                         ADL either performed or assessed with clinical judgement   ADL Overall ADL's : Modified independent                                       General ADL Comments: Pt educated in compensatory  strategies with ADL - bathing and dressing - cup method for oral care, safety in shower, setting up at counter height level, mechanics for toileting/kitchen, Pt verbalized understanding for all.     Vision Patient Visual Report: No change from baseline Vision Assessment?: No apparent visual deficits     Perception     Praxis      Pertinent Vitals/Pain Pain Assessment: 0-10 Pain Score: 4  Faces Pain Scale: Hurts little more Pain Location: lower neck and shoulders Pain Descriptors / Indicators: Sore Pain Intervention(s): Monitored during session;Repositioned;RN gave pain meds during session     Hand Dominance Right   Extremity/Trunk Assessment Upper Extremity Assessment Upper Extremity Assessment: Overall WFL for tasks assessed   Lower Extremity Assessment Lower Extremity Assessment: Defer to PT evaluation   Cervical / Trunk Assessment Cervical / Trunk Assessment:  (s/p spinal surgery)   Communication Communication Communication: No difficulties   Cognition Arousal/Alertness: Awake/alert Behavior During Therapy: WFL for tasks assessed/performed Overall Cognitive Status: Within Functional Limits for tasks assessed                                     General Comments  Pt asking about driving.     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 Problem List: Pain;Decreased range of motion;Decreased  knowledge of precautions      OT Treatment/Interventions:      OT Goals(Current goals can be found in the care plan section) Acute Rehab OT Goals Patient Stated Goal: to get better for move to [place] OT Goal Formulation: With patient Time For Goal Achievement: 08/03/17 Potential to Achieve Goals: Good  OT Frequency:     Barriers to D/C:            Co-evaluation              AM-PAC PT "6 Clicks" Daily Activity     Outcome Measure Help from another person eating meals?: None Help from another person taking care of personal grooming?: None Help from another person toileting, which includes using toliet, bedpan, or urinal?: None Help from another person bathing (including washing, rinsing, drying)?: A Little Help from another person to put on and taking off regular upper body clothing?: A Little Help from another person to put on and taking off regular lower body clothing?: A Little 6 Click Score: 21   End of Session Equipment Utilized During Treatment: Cervical collar Nurse Communication: Mobility status  Activity Tolerance: Patient tolerated treatment well Patient left: in bed;with call bell/phone within reach;with family/visitor present;with nursing/sitter in room  OT Visit Diagnosis: Pain Pain - Right/Left: Right Pain - part of body: Shoulder (neck)                Time: 4268-3419 OT Time Calculation (min): 24 min Charges:  OT General Charges $OT Visit: 1 Procedure OT Evaluation $OT Eval Low Complexity: 1 Procedure OT Treatments $Self Care/Home Management : 8-22 mins G-Codes: OT G-codes **NOT FOR INPATIENT CLASS** Functional Assessment Tool Used: Clinical judgement Functional Limitation: Self care Self Care Current Status (Q2229): At least 1 percent but less than 20 percent impaired, limited or restricted Self Care Goal Status (N9892): 0 percent impaired, limited or restricted Self Care Discharge Status [phone]): At least 1 percent but less than 20 percent  impaired, limited or restricted   Hulda Humphrey OTR/L [place] 07/20/2017, 8:57 AM

## 2017-07-20 NOTE — Anesthesia Postprocedure Evaluation (Signed)
Anesthesia Post Note  Patient: [name]  Procedure(s) Performed: Procedure(s) (LRB): Cervical five-six Anterior cervical decompression/discectomy/fusion (N/A)     Patient location during evaluation: PACU Anesthesia Type: General Level of consciousness: awake, awake and alert and oriented Pain management: pain level controlled Vital Signs Assessment: post-procedure vital signs reviewed and stable Respiratory status: spontaneous breathing, nonlabored ventilation and respiratory function stable Cardiovascular status: blood pressure returned to baseline Anesthetic complications: no    Last Vitals:  Vitals:   07/20/17 0400 07/20/17 0739  BP: (!) 154/100 135/88  Pulse: 96 90  Resp: 18 18  Temp: 37.1 C 37 C    Last Pain:  Vitals:   07/20/17 0848  TempSrc:   PainSc: 6                  Bryli Mantey COKER

## 2017-07-20 NOTE — Discharge Summary (Signed)
Physician Discharge Summary  Patient ID: [name] MRN: [id number] DOB/AGE: 41-Jul-1977 41 y.o.  Admit date: 07/19/2017 Discharge date: 07/20/2017  Admission Diagnoses: cervical HNP    Discharge Diagnoses: same   Discharged Condition: good  Hospital Course: The patient was admitted on 07/19/2017 and taken to the operating room where the patient underwent ACDF. The patient tolerated the procedure well and was taken to the recovery room and then to the floor in stable condition. The hospital course was routine. There were no complications. The wound remained clean dry and intact. Pt had appropriate neck soreness. No complaints of arm pain or new N/T/W. The patient remained afebrile with stable vital signs, and tolerated a regular diet. The patient continued to increase activities, and pain was well controlled with oral pain medications.   Consults: None  Significant Diagnostic Studies:  Results for orders placed or performed during the hospital encounter of 07/16/17  Surgical pcr screen  Result Value Ref Range   MRSA, PCR NEGATIVE NEGATIVE   Staphylococcus aureus NEGATIVE NEGATIVE  Basic metabolic panel  Result Value Ref Range   Sodium 138 135 - 145 mmol/L   Potassium 3.8 3.5 - 5.1 mmol/L   Chloride 106 101 - 111 mmol/L   CO2 26 22 - 32 mmol/L   Glucose, Bld 110 (H) 65 - 99 mg/dL   BUN 9 6 - 20 mg/dL   Creatinine, Ser 0.64 0.44 - 1.00 mg/dL   Calcium 9.0 8.9 - 10.3 mg/dL   GFR calc non Af Amer >60 >60 mL/min   GFR calc Af Amer >60 >60 mL/min   Anion gap 6 5 - 15  CBC  Result Value Ref Range   WBC 8.5 4.0 - 10.5 K/uL   RBC 4.27 3.87 - 5.11 MIL/uL   Hemoglobin 12.6 12.0 - 15.0 g/dL   HCT 38.4 36.0 - 46.0 %   MCV 89.9 78.0 - 100.0 fL   MCH 29.5 26.0 - 34.0 pg   MCHC 32.8 30.0 - 36.0 g/dL   RDW 13.1 11.5 - 15.5 %   Platelets 378 150 - 400 K/uL  hCG, serum, qualitative  Result Value Ref Range   Preg, Serum NEGATIVE NEGATIVE  Type and screen  Result Value Ref Range   ABO/RH(D) A POS    Antibody Screen NEG    Sample Expiration 07/30/2017    Extend sample reason NO TRANSFUSIONS OR PREGNANCY IN THE PAST 3 MONTHS   ABO/Rh  Result Value Ref Range   ABO/RH(D) A POS     Dg Cervical Spine 2-3 Views  Result Date: 07/19/2017 CLINICAL DATA:  ACDF C5-6 EXAM: CERVICAL SPINE - 2-3 VIEW COMPARISON:  CT cervical spine 05/10/2017 FINDINGS: Image 1 demonstrates needle localization of the C5-6 disc space. Image 2 demonstrates ACDF with anterior plate and screws at L8-9. IMPRESSION: Negative cervical spine radiographs. Electronically Signed   By: Franchot Gallo M.D.   On: 07/19/2017 15:22    Antibiotics:  Anti-infectives    Start     Dose/Rate Route Frequency Ordered Stop   07/19/17 2200  ceFAZolin (ANCEF) IVPB 2g/100 mL premix     2 g 200 mL/hr over 30 Minutes Intravenous Every 8 hours 07/19/17 1745 07/20/17 0531   07/19/17 1000  ceFAZolin (ANCEF) IVPB 2g/100 mL premix     2 g 200 mL/hr over 30 Minutes Intravenous To [facility] 07/18/17 0812 07/19/17 1419      Discharge Exam: Blood pressure (!) 154/100, pulse 96, temperature 98.7 F (37.1 C), temperature source Oral,  resp. rate 18, height 5' 4.5" (1.638 m), weight 67.6 kg (149 lb), last menstrual period 07/02/2017, SpO2 100 %. Neurologic: Grossly normal Dressing dry  Discharge Medications:   Allergies as of 07/20/2017      Reactions   Biaxin [clarithromycin] Other (See Comments)   Other reaction(s): Bleeding Cant take due to Chrons disease   Codeine Nausea And Vomiting, Other (See Comments)   Other reaction(s): Bleeding Dilaudid and demerol OK   Compazine [prochlorperazine Edisylate] Anaphylaxis   Reglan [metoclopramide] Anaphylaxis, Other (See Comments)   Other reaction(s): GI Upset (intolerance) Intensifies her Chron's   Remicade [infliximab] Other (See Comments)   Serum Sickness  Couldn't walk, open mouth, pain,    Sulfamethoxazole Rash, Other (See Comments)   Internal bleeding and kidney  infection   Chlorhexidine Gluconate    Other reaction(s): Redness   Doxycycline Nausea And Vomiting   Other Rash   GuardIVa - PICC line antimicrobial dressing  Redness Resultant infection with PICC line x2 with irritation from adhesive "chloraprep"   Oxycodone Nausea And Vomiting   Promethazine Rash, Other (See Comments)   Muscular seizures   Hydrocodone Nausea And Vomiting   Macrobid [nitrofurantoin] Nausea And Vomiting   Other reaction(s): GI Upset (intolerance)      Medication List    TAKE these medications   acetaminophen 500 MG tablet Commonly known as:  TYLENOL Take 1,000 mg by mouth every 6 (six) hours as needed for headache.   albuterol 108 (90 Base) MCG/ACT inhaler Commonly known as:  PROVENTIL HFA;VENTOLIN HFA Inhale 2 puffs into the lungs every 6 (six) hours as needed for wheezing or shortness of breath.   albuterol (2.5 MG/3ML) 0.083% nebulizer solution Commonly known as:  PROVENTIL Take 3 mLs (2.5 mg total) by nebulization every 6 (six) hours as needed for wheezing or shortness of breath.   ALPRAZolam 0.25 MG tablet Commonly known as:  XANAX TAKE 2 TABLETS BY MOUTH EVERY DAY AS NEEDED What changed:  See the new instructions.   azaTHIOprine 50 MG tablet Commonly known as:  IMURAN TAKE THREE TABLETS BY MOUTH DAILY AS DIRECTED   butalbital-acetaminophen-caffeine 50-325-40 MG tablet Commonly known as:  FIORICET, ESGIC TAKE TWO CAPSULES BY MOUTH EVERY 6 HOURSAS NEEDED. What changed:  See the new instructions.   cetirizine 10 MG tablet Commonly known as:  ZYRTEC Take 10 mg by mouth at bedtime.   cyanocobalamin 1000 MCG/ML injection Commonly known as:  (VITAMIN B-12) Inject 1,000 mcg into the muscle every 21 ( twenty-one) days.   dexlansoprazole 60 MG capsule Commonly known as:  DEXILANT Take 1 capsule (60 mg total) by mouth daily. What changed:  when to take this   ENTYVIO IV Inject into the vein as directed.   HYDROmorphone 2 MG tablet Commonly  known as:  DILAUDID Take 1 tablet (2 mg total) by mouth every 6 (six) hours as needed for moderate pain.   methocarbamol 500 MG tablet Commonly known as:  ROBAXIN Take 1 tablet (500 mg total) by mouth every 6 (six) hours as needed for muscle spasms.   metoprolol tartrate 25 MG tablet Commonly known as:  LOPRESSOR Take 0.5 tablets (12.5 mg total) by mouth 2 (two) times daily.   midodrine 5 MG tablet Commonly known as:  PROAMATINE Take 1 tablet (5 mg total) by mouth 2 (two) times daily with a meal.   montelukast 10 MG tablet Commonly known as:  SINGULAIR TAKE ONE TABLET BY MOUTH EVERY NIGHT AT BEDTIME   PARoxetine 20 MG tablet Commonly  known as:  PAXIL TAKE 1 TABLET BY MOUTH DAILY   zolpidem 10 MG tablet Commonly known as:  AMBIEN TAKE ONE TABLET BY MOUTH EVERY NIGHT AT BEDTIME       Disposition: home   Final Dx: ACDF  Discharge Instructions     Remove dressing in 72 hours    Complete by:  As directed    Call MD for:  difficulty breathing, headache or visual disturbances    Complete by:  As directed    Call MD for:  persistant nausea and vomiting    Complete by:  As directed    Call MD for:  redness, tenderness, or signs of infection (pain, swelling, redness, odor or green/yellow discharge around incision site)    Complete by:  As directed    Call MD for:  severe uncontrolled pain    Complete by:  As directed    Call MD for:  temperature >100.4    Complete by:  As directed    Diet - low sodium heart healthy    Complete by:  As directed    Increase activity slowly    Complete by:  As directed          Signed: Teliyah Royal S 07/20/2017, 7:19 AM

## 2017-07-22 ENCOUNTER — Encounter (HOSPITAL_COMMUNITY): Payer: Self-pay | Admitting: Neurosurgery

## 2017-07-22 ENCOUNTER — Encounter: Payer: Self-pay | Admitting: Gastroenterology

## 2017-08-15 ENCOUNTER — Telehealth: Payer: Self-pay | Admitting: Gastroenterology

## 2017-08-15 ENCOUNTER — Other Ambulatory Visit: Payer: Self-pay

## 2017-08-15 MED ORDER — VEDOLIZUMAB 300 MG IV SOLR: 300.0000 mg | INTRAVENOUS | 6 refills | Status: DC

## 2017-08-15 NOTE — Telephone Encounter (Signed)
MADE IN ERROR

## 2017-08-19 ENCOUNTER — Other Ambulatory Visit: Payer: Self-pay | Admitting: Family Medicine

## 2017-08-19 NOTE — Telephone Encounter (Signed)
Last office visit 04/29/2017 with R. Garnette Gunner for Urinary Urgency.  Last refilled Zolpidem 07/16/2017 for #30 with no refills.  Alprazolam 07/04/2017 for #30 with no refills.  Ok to refill?

## 2017-08-20 NOTE — Telephone Encounter (Signed)
Alprazolam & Zolpidem called in : [place], [place] Phone: [phone]

## 2017-09-16 ENCOUNTER — Encounter: Payer: Self-pay | Admitting: Primary Care

## 2017-09-16 ENCOUNTER — Telehealth: Payer: Self-pay | Admitting: Gastroenterology

## 2017-09-16 ENCOUNTER — Ambulatory Visit (INDEPENDENT_AMBULATORY_CARE_PROVIDER_SITE_OTHER): Payer: BC Managed Care – PPO | Admitting: Primary Care

## 2017-09-16 ENCOUNTER — Other Ambulatory Visit: Payer: Self-pay | Admitting: Family Medicine

## 2017-09-16 ENCOUNTER — Other Ambulatory Visit: Payer: Self-pay

## 2017-09-16 VITALS — BP 140/80 | HR 87 | Temp 98.4°F | Ht 64.0 in | Wt 153.1 lb

## 2017-09-16 DIAGNOSIS — J45909 Unspecified asthma, uncomplicated: Secondary | ICD-10-CM | POA: Diagnosis not present

## 2017-09-16 DIAGNOSIS — J069 Acute upper respiratory infection, unspecified: Secondary | ICD-10-CM

## 2017-09-16 MED ORDER — ALBUTEROL SULFATE HFA 108 (90 BASE) MCG/ACT IN AERS: 2.0000 | INHALATION_SPRAY | Freq: Four times a day (QID) | RESPIRATORY_TRACT | 0 refills | Status: DC | PRN

## 2017-09-16 MED ORDER — GUAIFENESIN-CODEINE 100-10 MG/5ML PO SOLN: 5.0000 mL | Freq: Every evening | ORAL | 0 refills | Status: DC | PRN

## 2017-09-16 MED ORDER — AZITHROMYCIN 250 MG PO TABS: ORAL_TABLET | ORAL | 0 refills | Status: DC

## 2017-09-16 MED ORDER — PREDNISONE 20 MG PO TABS
ORAL_TABLET | ORAL | 0 refills | Status: DC
Start: 2017-09-16 — End: 2018-07-22

## 2017-09-16 MED ORDER — MONTELUKAST SODIUM 10 MG PO TABS: 10.0000 mg | ORAL_TABLET | Freq: Every day | ORAL | 0 refills | Status: DC

## 2017-09-16 MED ORDER — DEXLANSOPRAZOLE 60 MG PO CPDR: 60.0000 mg | DELAYED_RELEASE_CAPSULE | Freq: Every day | ORAL | 11 refills | Status: DC

## 2017-09-16 NOTE — Progress Notes (Signed)
Subjective:    Patient ID: [name], female    DOB: Jun 01, 1976, 41 y.o.   MRN: [id number]  HPI  [name] is a 41 year old female with a history of allergic rhinitis, asthma, GERD who presents today with a chief complaint of cough. She also reports bilateral ear pain, fevers, sinus pressure, nasal congestion.  Her symptoms began two days ago. She has been around her four children who were all treated with bacterial infections and asthma exacerbations with prednisone and Zpak's. She's not used her albuterol inhaler. She's compliant to her Zyrtec and Singulair. She's taken guaifenesin, Rx cough syrup with codeine, and tylenol with temporary improvement.   She is leaving to move [place] in 5 days and will not return until July 2019. She is worried that her symptoms will progress.   Review of Systems  Constitutional: Positive for chills, fatigue and fever.  HENT: Positive for congestion and ear pain.   Respiratory: Positive for chest tightness and shortness of breath. Negative for wheezing.   Gastrointestinal: Negative for nausea.       Past Medical History:  Diagnosis Date  . Anxiety   . Asthma   . Chest pain    a. s/p intermediate risk nuclear stress test on 09/23/14 and normal LHC on 09/24/14   . Chronic nausea   . Crohn disease ([place]) 2000  . Dysautonomia    recurrent syncope s/p ILR by Dr Doreatha Lew  . Endometriosis   . Essential hypertension 03/17/2017   a - Renal artery [name] 3/18: normal    . GERD (gastroesophageal reflux disease)   . H/O hiatal hernia   . Migraine   . Palpitations   . PONV (postoperative nausea and vomiting)   . Pulmonary embolism affecting pregnancy   . Vertigo      Social History   Social History  . Marital status: Married    Spouse name: Jonni Sanger  . Number of children: 2  . Years of education: college   Occupational History  .  Other    Airline pilot  .  [place] History Main Topics  . Smoking status: Never Smoker  .  Smokeless tobacco: Never Used  . Alcohol use 0.6 oz/week    1 Glasses of wine per week     Comment: 1 glass per week  . Drug use: No  . Sexual activity: Yes    Birth control/ protection: Surgical   Other Topics Concern  . Not on file   Social History Narrative   Patient lives at home with her husband Jonni Sanger). Patient works full time for [employer].    Education [employer].   Right handed.   Caffeine one cup of coffee daily.    Past Surgical History:  Procedure Laterality Date  . [place] STUDY N/A 12/31/2016   Procedure: [place] STUDY;  Surgeon: Manus Gunning, MD;  Location: [facility];  Service: Gastroenterology;  Laterality: N/A;  . ANTERIOR CERVICAL DECOMP/DISCECTOMY FUSION N/A 07/19/2017   Procedure: Cervical five-six Anterior cervical decompression/discectomy/fusion;  Surgeon: Erline Levine, MD;  Location: [facility];  Service: Neurosurgery;  Laterality: N/A;  C5-6 Anterior cervical decompression/discectomy/fusion  . APPENDECTOMY  ~ 1992  . CARDIAC CATHETERIZATION    . CESAREAN SECTION  2011   emergency  CS due to placental abruption  . CESAREAN SECTION  2014   "preeclampsia"  . COLON SURGERY    . CYSTOSCOPY W/ STONE MANIPULATION  X 2  .  ENDOSCOPIC RELEASE TRANSVERSE CARPAL LIGAMENT OF HAND Right 12/2010  . ESOPHAGEAL MANOMETRY N/A 12/31/2016   Procedure: ESOPHAGEAL MANOMETRY (EM);  Surgeon: Manus Gunning, MD;  Location: [facility];  Service: Gastroenterology;  Laterality: N/A;  . ILEOCECETOMY  2002 X 2  . KNEE ARTHROSCOPY Bilateral 1988-1990  . LAPAROSCOPY FOR ECTOPIC PREGNANCY  11/2012  . LEFT HEART CATHETERIZATION WITH CORONARY ANGIOGRAM N/A 09/24/2014   Procedure: LEFT HEART CATHETERIZATION WITH CORONARY ANGIOGRAM;  Surgeon: Leonie Man, MD;  Location: [facility];  Service: Cardiovascular;  Laterality: N/A;  . LOOP RECORDER IMPLANT  11/2009   by DR Doreatha Lew  . LOOP RECORDER REMOVAL N/A 07/05/2017   Procedure: Loop Recorder Removal;   Surgeon: Evans Lance, MD;  Location: [place] CV LAB;  Service: Cardiovascular;  Laterality: N/A;  . NASAL SINUS SURGERY  ~ 2007  . PARTIAL COLECTOMY  2002   partial removed due to crohns  . TONSILLECTOMY  ~ 1996  . US ECHOCARDIOGRAPHY  11/11/2009   EF 55-60%    Family History  Problem Relation Age of Onset  . Hypertension Mother   . Hyperlipidemia Mother   . Arthritis Mother   . Diabetes Father   . Coronary artery disease Father   . Heart disease Father 29       MI age 51  . Hyperlipidemia Brother   . Hypertension Brother   . Colon cancer Paternal Grandmother     Allergies  Allergen Reactions  . Biaxin [Clarithromycin] Other (See Comments)    Other reaction(s): Bleeding Cant take due to Chrons disease  . Codeine Nausea And Vomiting and Other (See Comments)    Other reaction(s): Bleeding Dilaudid and demerol OK  . Compazine [Prochlorperazine Edisylate] Anaphylaxis  . Reglan [Metoclopramide] Anaphylaxis and Other (See Comments)    Other reaction(s): GI Upset (intolerance) Intensifies her Chron's  . Remicade [Infliximab] Other (See Comments)    Serum Sickness  Couldn't walk, open mouth, pain,   . Sulfamethoxazole Rash and Other (See Comments)    Internal bleeding and kidney infection  . Chlorhexidine Gluconate     Other reaction(s): Redness  . Doxycycline Nausea And Vomiting  . Other Rash    GuardIVa - PICC line antimicrobial dressing  Redness Resultant infection with PICC line x2 with irritation from adhesive "chloraprep"  . Oxycodone Nausea And Vomiting  . Promethazine Rash and Other (See Comments)    Muscular seizures  . Hydrocodone Nausea And Vomiting  . Macrobid [Nitrofurantoin] Nausea And Vomiting    Other reaction(s): GI Upset (intolerance)    Current Outpatient Prescriptions on File Prior to Visit  Medication Sig Dispense Refill  . acetaminophen (TYLENOL) 500 MG tablet Take 1,000 mg by mouth every 6 (six) hours as needed for headache.    .  albuterol (PROVENTIL) (2.5 MG/3ML) 0.083% nebulizer solution Take 3 mLs (2.5 mg total) by nebulization every 6 (six) hours as needed for wheezing or shortness of breath. 150 mL 1  . ALPRAZolam (XANAX) 0.25 MG tablet TAKE 2 TABLETS BY MOUTH EVERY DAY AS NEEDED 30 tablet 1  . azaTHIOprine (IMURAN) 50 MG tablet TAKE THREE TABLETS BY MOUTH DAILY AS DIRECTED 90 tablet 3  . butalbital-acetaminophen-caffeine (FIORICET, ESGIC) 50-325-40 MG tablet TAKE TWO CAPSULES BY MOUTH EVERY 6 HOURSAS NEEDED. (Patient taking differently: TAKE TWO CAPSULES BY MOUTH EVERY 6 HOURS AS NEEDED FOR MIGRAINES) 20 tablet 0  . cetirizine (ZYRTEC) 10 MG tablet Take 10 mg by mouth at bedtime.    . cyanocobalamin (,VITAMIN B-12,) 1000  MCG/ML injection Inject 1,000 mcg into the muscle every 21 ( twenty-one) days.    Mar[place] Kitchen dexlansoprazole (DEXILANT) 60 MG capsule Take 1 capsule (60 mg total) by mouth daily. (Patient taking differently: Take 60 mg by mouth at bedtime. ) 30 capsule 5  . metoprolol tartrate (LOPRESSOR) 25 MG tablet Take 0.5 tablets (12.5 mg total) by mouth 2 (two) times daily. 180 tablet 3  . midodrine (PROAMATINE) 5 MG tablet Take 1 tablet (5 mg total) by mouth 2 (two) times daily with a meal. 60 tablet 11  . PARoxetine (PAXIL) 20 MG tablet TAKE 1 TABLET BY MOUTH DAILY 90 tablet 1  . sodium chloride 0.9 % SOLN 250 mL with vedolizumab 300 MG SOLR 300 mg Inject 300 mg into the vein every 8 (eight) weeks. 300 mg 6  . Vedolizumab (ENTYVIO IV) Inject into the vein as directed.    . zolpidem (AMBIEN) 10 MG tablet TAKE ONE TABLET BY MOUTH EVERY NIGHT AT BEDTIME 30 tablet 1   Current Facility-Administered Medications on File Prior to Visit  Medication Dose Route Frequency Provider Last Rate Last Dose  . 0.9 %  sodium chloride infusion  500 mL Intravenous Continuous Armbruster, Carlota Raspberry, MD        BP 140/80   Pulse 87   Temp 98.4 F (36.9 C) (Oral)   Ht 5' 4"  (1.626 m)   Wt 153 lb 1.9 oz (69.5 kg)   LMP 09/01/2017   SpO2  98%   BMI 26.28 kg/m    Objective:   Physical Exam  Constitutional: She appears well-nourished. She appears ill.  HENT:  Right Ear: Tympanic membrane and ear canal normal.  Left Ear: Tympanic membrane and ear canal normal.  Nose: Mucosal edema present. Right sinus exhibits maxillary sinus tenderness. Right sinus exhibits no frontal sinus tenderness. Left sinus exhibits maxillary sinus tenderness. Left sinus exhibits no frontal sinus tenderness.  Mouth/Throat: Oropharynx is clear and moist.  Eyes: Conjunctivae are normal.  Neck: Neck supple.  Cardiovascular: Normal rate and regular rhythm.   Pulmonary/Chest: Effort normal and breath sounds normal. She has no wheezes. She has no rales.  Lymphadenopathy:    She has no cervical adenopathy.  Skin: Skin is warm and dry.          Assessment & Plan:  URI vs acute asthma exacerbation:  Cough, congestion, shortness of breath, low grad fever x 2 days. All four children recently treated for respiratory bacterial infections. Exam today overall stable, does appear acutely ill. Given that she's leaving for [place] in 5 days, will prepare her in case she needs treatment. Rx for Zpak printed to fill in 3-4 days if symptoms do not improve. Rx for prednisone burst to start now for early asthma flare. Refilled albuterol and Singulair. Rx for Robitussin AC provided to use HS PRN. Fluids, rest, follow up PRN.  Sheral Flow, NP

## 2017-09-16 NOTE — Telephone Encounter (Signed)
Routed to Dr. Armbruster. 

## 2017-09-16 NOTE — Telephone Encounter (Signed)
Patient was seen on 09/16/2017 for an acute. Patient request to have these medication refill.  Xanax 0.25 mg tablet  Ambien 10 mg tablet  Last prescribed on 08/20/2017. Last see by Dr Diona Browner on 01/31/2017

## 2017-09-16 NOTE — Telephone Encounter (Signed)
Left message for patient that I will print out an Rx for Dexilant, but have questions about who she is seeing and following who is will be monitoring this overseas. Asked patient to call back with this information.

## 2017-09-16 NOTE — Patient Instructions (Signed)
Start prednisone tablets for chest tightness and shortness of breath. Take 2 tablets daily for 5 days.  Shortness of Breath/Wheezing/Cough: Use the albuterol inhaler. Inhale 2 puffs into the lungs every 6 to 8 hours as needed for wheezing and/or shortness of breath.   Nasal Congestion/Ear Pressure: Try using Flonase (fluticasone) nasal spray. Instill 1 spray in each nostril twice daily.   You may take the cough suppressant at bedtime as needed for cough and rest. Caution this medication contains codeine and will make you feel drowsy.  If no improvement in 3-4 days then please start Azithromycin antibiotics. Take 2 tablets by mouth today, then 1 tablet daily for 4 additional days.  Ensure you are staying hydrated with water and rest.  It was a pleasure to see you today!

## 2017-09-16 NOTE — Telephone Encounter (Signed)
We can give her a script for Dexilant. The [place] is much more complicated. She needs to be monitored on this medication by someone who is familiar with the drug and colitis. She needs to have a provider who is already set up to administer the medication. Has she done this? I am not comfortable with writing a prescription for this medication overseas without plans set up in advance for where she will get this and who will monitor it. Can you help clarify.

## 2017-09-17 ENCOUNTER — Telehealth: Payer: Self-pay

## 2017-09-17 ENCOUNTER — Other Ambulatory Visit: Payer: Self-pay | Admitting: *Deleted

## 2017-09-17 DIAGNOSIS — J45909 Unspecified asthma, uncomplicated: Secondary | ICD-10-CM

## 2017-09-17 DIAGNOSIS — J069 Acute upper respiratory infection, unspecified: Secondary | ICD-10-CM

## 2017-09-17 MED ORDER — ZOLPIDEM TARTRATE 10 MG PO TABS: 10.0000 mg | ORAL_TABLET | Freq: Every day | ORAL | 1 refills | Status: DC

## 2017-09-17 MED ORDER — ALPRAZOLAM 0.25 MG PO TABS: ORAL_TABLET | ORAL | 1 refills | Status: DC

## 2017-09-17 MED ORDER — PAROXETINE HCL 20 MG PO TABS: 20.0000 mg | ORAL_TABLET | Freq: Every day | ORAL | 3 refills | Status: DC

## 2017-09-17 MED ORDER — BUTALBITAL-APAP-CAFFEINE 50-325-40 MG PO TABS: ORAL_TABLET | ORAL | 1 refills | Status: DC

## 2017-09-17 NOTE — Telephone Encounter (Signed)
Called in medication to the pharmacy as instructed. 

## 2017-09-17 NOTE — Telephone Encounter (Signed)
Mailed patient prescription for Dexilant for one year. Enclosed a note asking her to contact our office about who will be handling her Entyvio and following up with her while overseas.

## 2017-09-17 NOTE — Telephone Encounter (Signed)
I will not prescribe butabital, alprazolam and ambien for 1 year at a time.. 6 months only max. Will need [place] be seen [place] 6 [place] for refills if cannot come [place] to the states in 6 month for follow up.

## 2017-09-17 NOTE — Telephone Encounter (Signed)
Pt left v/m; pt seen 09/16/17; pt thinks pharmacy faxed a list of inhalers that are approved by ins co. Pt request inhaler for asthma sent to [name]. I spoke with Marjory Lies at [employer] and he will get albuterol inhaler ready for pick up. Pt notified and voiced understanding.

## 2017-09-18 NOTE — Telephone Encounter (Signed)
[name] notified as instructed by telephone.  While one phone she states she need Rx for all her medications.  Ok to refill the ones listed?

## 2017-09-18 NOTE — Addendum Note (Signed)
Addended by: Carter Kitten on: 09/18/2017 11:03 AM   Modules accepted: Orders

## 2017-09-19 ENCOUNTER — Telehealth: Payer: Self-pay | Admitting: Primary Care

## 2017-09-19 ENCOUNTER — Other Ambulatory Visit: Payer: Self-pay | Admitting: Primary Care

## 2017-09-19 MED ORDER — CYCLOBENZAPRINE HCL 5 MG PO TABS: 5.0000 mg | ORAL_TABLET | Freq: Every day | ORAL | 1 refills | Status: DC

## 2017-09-19 MED ORDER — ALBUTEROL SULFATE HFA 108 (90 BASE) MCG/ACT IN AERS: 2.0000 | INHALATION_SPRAY | Freq: Four times a day (QID) | RESPIRATORY_TRACT | 3 refills | Status: DC | PRN

## 2017-09-19 MED ORDER — MIDODRINE HCL 5 MG PO TABS: 5.0000 mg | ORAL_TABLET | Freq: Two times a day (BID) | ORAL | 3 refills | Status: DC

## 2017-09-19 MED ORDER — CYANOCOBALAMIN 1000 MCG/ML IJ SOLN: 1000.0000 ug | INTRAMUSCULAR | 3 refills | Status: DC

## 2017-09-19 MED ORDER — ALBUTEROL SULFATE (2.5 MG/3ML) 0.083% IN NEBU: 2.5000 mg | INHALATION_SOLUTION | Freq: Four times a day (QID) | RESPIRATORY_TRACT | 3 refills | Status: DC | PRN

## 2017-09-19 MED ORDER — MONTELUKAST SODIUM 10 MG PO TABS: 10.0000 mg | ORAL_TABLET | Freq: Every day | ORAL | 3 refills | Status: DC

## 2017-09-19 MED ORDER — DEXLANSOPRAZOLE 60 MG PO CPDR: 60.0000 mg | DELAYED_RELEASE_CAPSULE | Freq: Every day | ORAL | 3 refills | Status: DC

## 2017-09-19 MED ORDER — CETIRIZINE HCL 10 MG PO TABS: 10.0000 mg | ORAL_TABLET | Freq: Every day | ORAL | 3 refills | Status: DC

## 2017-09-19 MED ORDER — AZATHIOPRINE 50 MG PO TABS: ORAL_TABLET | ORAL | 3 refills | Status: DC

## 2017-09-19 NOTE — Telephone Encounter (Signed)
Please ask patient if she picked up the Ventolin inhaler. Her insurance is requesting we change it to ProAir. If she's not picked it up then i'll make the switch. Let me know. Thanks.

## 2017-09-19 NOTE — Telephone Encounter (Signed)
Prescriptions placed in Dr. Rometta Emery in box for signature.

## 2017-09-19 NOTE — Telephone Encounter (Signed)
Lm on pts vm and requested a call back

## 2017-09-20 ENCOUNTER — Other Ambulatory Visit: Payer: Self-pay | Admitting: Primary Care

## 2017-09-20 MED ORDER — ALBUTEROL SULFATE HFA 108 (90 BASE) MCG/ACT IN AERS: 2.0000 | INHALATION_SPRAY | RESPIRATORY_TRACT | 0 refills | Status: DC | PRN

## 2017-09-20 NOTE — Telephone Encounter (Signed)
Patient says she picked up the Rx and it was ProAir.

## 2017-09-20 NOTE — Telephone Encounter (Signed)
Noted  

## 2017-09-20 NOTE — Telephone Encounter (Signed)
Sammy notified prescriptions and Snap Shot with diagnosis are ready for her to pick up at the front desk.

## 2017-10-04 ENCOUNTER — Encounter: Payer: Self-pay | Admitting: Gastroenterology

## 2017-10-08 ENCOUNTER — Encounter: Payer: Self-pay | Admitting: Primary Care

## 2017-10-13 ENCOUNTER — Other Ambulatory Visit: Payer: Self-pay | Admitting: Primary Care

## 2017-10-13 DIAGNOSIS — J45909 Unspecified asthma, uncomplicated: Secondary | ICD-10-CM

## 2017-12-26 ENCOUNTER — Ambulatory Visit: Payer: Self-pay | Admitting: General Practice

## 2018-06-20 ENCOUNTER — Telehealth: Payer: Self-pay | Admitting: Internal Medicine

## 2018-06-20 ENCOUNTER — Encounter (INDEPENDENT_AMBULATORY_CARE_PROVIDER_SITE_OTHER): Payer: Self-pay

## 2018-06-20 NOTE — Telephone Encounter (Signed)
New Message:    Pt is in [place] and is coming in on 07-18-18 and will only be he here until 07-22-18 until 12:00. She wants to know if there is any way possible you could work her in early on 07-22-18. She said Dr Lovena Le is aware that she moved to [place] and would still like to see him when she is [place] the states, Thank you so much for your help.

## 2018-06-23 NOTE — Telephone Encounter (Signed)
Appt scheduled per Pt request by scheduling. No action needed by this nurse.

## 2018-06-27 ENCOUNTER — Other Ambulatory Visit: Payer: Self-pay | Admitting: Neurosurgery

## 2018-06-27 DIAGNOSIS — M5416 Radiculopathy, lumbar region: Secondary | ICD-10-CM

## 2018-07-18 ENCOUNTER — Ambulatory Visit (HOSPITAL_BASED_OUTPATIENT_CLINIC_OR_DEPARTMENT_OTHER): Payer: 59 | Admitting: Anesthesiology

## 2018-07-18 ENCOUNTER — Encounter (HOSPITAL_BASED_OUTPATIENT_CLINIC_OR_DEPARTMENT_OTHER): Payer: Self-pay | Admitting: Emergency Medicine

## 2018-07-18 ENCOUNTER — Ambulatory Visit (HOSPITAL_BASED_OUTPATIENT_CLINIC_OR_DEPARTMENT_OTHER)
Admission: RE | Admit: 2018-07-18 | Discharge: 2018-07-18 | Disposition: A | Discharge status: Home / Self Care | Payer: 59 | Source: Ambulatory Visit | Attending: Orthopedic Surgery | Admitting: Orthopedic Surgery

## 2018-07-18 ENCOUNTER — Encounter (HOSPITAL_BASED_OUTPATIENT_CLINIC_OR_DEPARTMENT_OTHER)
Admission: RE | Disposition: A | Discharge status: Home / Self Care | Payer: Self-pay | Source: Ambulatory Visit | Attending: Orthopedic Surgery

## 2018-07-18 ENCOUNTER — Other Ambulatory Visit: Payer: Self-pay

## 2018-07-18 DIAGNOSIS — Z79899 Other long term (current) drug therapy: Secondary | ICD-10-CM | POA: Insufficient documentation

## 2018-07-18 DIAGNOSIS — S93324A Dislocation of tarsometatarsal joint of right foot, initial encounter: Secondary | ICD-10-CM

## 2018-07-18 DIAGNOSIS — G5761 Lesion of plantar nerve, right lower limb: Secondary | ICD-10-CM | POA: Diagnosis not present

## 2018-07-18 DIAGNOSIS — Z86711 Personal history of pulmonary embolism: Secondary | ICD-10-CM | POA: Insufficient documentation

## 2018-07-18 DIAGNOSIS — J45909 Unspecified asthma, uncomplicated: Secondary | ICD-10-CM | POA: Diagnosis not present

## 2018-07-18 DIAGNOSIS — I10 Essential (primary) hypertension: Secondary | ICD-10-CM | POA: Diagnosis not present

## 2018-07-18 DIAGNOSIS — S93325A Dislocation of tarsometatarsal joint of left foot, initial encounter: Secondary | ICD-10-CM | POA: Insufficient documentation

## 2018-07-18 DIAGNOSIS — M79671 Pain in right foot: Secondary | ICD-10-CM | POA: Diagnosis present

## 2018-07-18 DIAGNOSIS — G43909 Migraine, unspecified, not intractable, without status migrainosus: Secondary | ICD-10-CM | POA: Diagnosis not present

## 2018-07-18 DIAGNOSIS — Z9049 Acquired absence of other specified parts of digestive tract: Secondary | ICD-10-CM | POA: Insufficient documentation

## 2018-07-18 DIAGNOSIS — F419 Anxiety disorder, unspecified: Secondary | ICD-10-CM | POA: Diagnosis not present

## 2018-07-18 DIAGNOSIS — K219 Gastro-esophageal reflux disease without esophagitis: Secondary | ICD-10-CM | POA: Insufficient documentation

## 2018-07-18 HISTORY — PX: OPEN REDUCTION INTERNAL FIXATION (ORIF) FOOT LISFRANC FRACTURE: SHX5990

## 2018-07-18 LAB — POCT I-STAT, CHEM 8
BUN: 9 mg/dL (ref 6–20)
CALCIUM ION: 1.12 mmol/L — AB (ref 1.15–1.40)
Chloride: 106 mmol/L (ref 98–111)
Creatinine, Ser: 0.7 mg/dL (ref 0.44–1.00)
GLUCOSE: 93 mg/dL (ref 70–99)
HCT: 41 % (ref 36.0–46.0)
HEMOGLOBIN: 13.9 g/dL (ref 12.0–15.0)
Potassium: 3.8 mmol/L (ref 3.5–5.1)
SODIUM: 139 mmol/L (ref 135–145)
TCO2: 23 mmol/L (ref 22–32)

## 2018-07-18 SURGERY — OPEN REDUCTION INTERNAL FIXATION (ORIF) FOOT LISFRANC FRACTURE
Anesthesia: General | Site: Foot | Laterality: Right

## 2018-07-18 MED ORDER — LACTATED RINGERS IV SOLN
INTRAVENOUS | Status: DC
  Administered 2018-07-18 (×2): via INTRAVENOUS

## 2018-07-18 MED ORDER — PHENYLEPHRINE HCL 10 MG/ML IJ SOLN
INTRAMUSCULAR | Status: DC | PRN
  Administered 2018-07-18: 80 ug via INTRAVENOUS

## 2018-07-18 MED ORDER — SCOPOLAMINE 1 MG/3DAYS TD PT72
MEDICATED_PATCH | TRANSDERMAL | Status: AC
  Filled 2018-07-18: qty 1

## 2018-07-18 MED ORDER — DEXAMETHASONE SODIUM PHOSPHATE 10 MG/ML IJ SOLN
INTRAMUSCULAR | Status: DC | PRN
  Administered 2018-07-18: 10 mg via INTRAVENOUS

## 2018-07-18 MED ORDER — CEFAZOLIN SODIUM 1 G IJ SOLR
INTRAMUSCULAR | Status: AC
  Filled 2018-07-18: qty 20

## 2018-07-18 MED ORDER — HYDROMORPHONE HCL 2 MG PO TABS: 2.0000 mg | ORAL_TABLET | Freq: Four times a day (QID) | ORAL | 0 refills | Status: AC | PRN

## 2018-07-18 MED ORDER — FENTANYL CITRATE (PF) 100 MCG/2ML IJ SOLN
INTRAMUSCULAR | Status: AC
  Filled 2018-07-18: qty 2

## 2018-07-18 MED ORDER — SCOPOLAMINE 1 MG/3DAYS TD PT72
1.0000 | MEDICATED_PATCH | Freq: Once | TRANSDERMAL | Status: DC | PRN
  Administered 2018-07-18: 1.5 mg via TRANSDERMAL

## 2018-07-18 MED ORDER — ACETAMINOPHEN 500 MG PO TABS: 1000.0000 mg | ORAL_TABLET | Freq: Three times a day (TID) | ORAL | 0 refills | Status: AC

## 2018-07-18 MED ORDER — EPHEDRINE SULFATE 50 MG/ML IJ SOLN
INTRAMUSCULAR | Status: DC | PRN
  Administered 2018-07-18 (×2): 10 mg via INTRAVENOUS

## 2018-07-18 MED ORDER — CEFAZOLIN SODIUM-DEXTROSE 2-3 GM-%(50ML) IV SOLR
INTRAVENOUS | Status: DC | PRN
  Administered 2018-07-18: 2 g via INTRAVENOUS

## 2018-07-18 MED ORDER — ROPIVACAINE HCL 7.5 MG/ML IJ SOLN
INTRAMUSCULAR | Status: DC | PRN
  Administered 2018-07-18: 30 mL via PERINEURAL
  Administered 2018-07-18: 10 mL via PERINEURAL

## 2018-07-18 MED ORDER — PROPOFOL 10 MG/ML IV BOLUS
INTRAVENOUS | Status: DC | PRN
  Administered 2018-07-18: 200 mg via INTRAVENOUS

## 2018-07-18 MED ORDER — ONDANSETRON HCL 4 MG PO TABS: 4.0000 mg | ORAL_TABLET | Freq: Three times a day (TID) | ORAL | 0 refills | Status: DC | PRN

## 2018-07-18 MED ORDER — METHOCARBAMOL 500 MG PO TABS: 500.0000 mg | ORAL_TABLET | Freq: Four times a day (QID) | ORAL | 0 refills | Status: DC | PRN

## 2018-07-18 MED ORDER — MIDAZOLAM HCL 2 MG/2ML IJ SOLN
1.0000 mg | INTRAMUSCULAR | Status: DC | PRN
  Administered 2018-07-18: 2 mg via INTRAVENOUS
  Administered 2018-07-18: 1 mg via INTRAVENOUS

## 2018-07-18 MED ORDER — GABAPENTIN 300 MG PO CAPS: 300.0000 mg | ORAL_CAPSULE | Freq: Two times a day (BID) | ORAL | 0 refills | Status: DC

## 2018-07-18 MED ORDER — MIDAZOLAM HCL 2 MG/2ML IJ SOLN
INTRAMUSCULAR | Status: AC
  Filled 2018-07-18: qty 2

## 2018-07-18 MED ORDER — METHYLPREDNISOLONE ACETATE 40 MG/ML IJ SUSP
INTRAMUSCULAR | Status: AC
  Filled 2018-07-18: qty 1

## 2018-07-18 MED ORDER — FENTANYL CITRATE (PF) 100 MCG/2ML IJ SOLN
50.0000 ug | INTRAMUSCULAR | Status: DC | PRN
  Administered 2018-07-18: 100 ug via INTRAVENOUS

## 2018-07-18 MED ORDER — DOCUSATE SODIUM 100 MG PO CAPS: 100.0000 mg | ORAL_CAPSULE | Freq: Two times a day (BID) | ORAL | 0 refills | Status: DC

## 2018-07-18 MED ORDER — FENTANYL CITRATE (PF) 100 MCG/2ML IJ SOLN: 25.0000 ug | INTRAMUSCULAR | Status: DC | PRN

## 2018-07-18 MED ORDER — METHYLPREDNISOLONE ACETATE 40 MG/ML IJ SUSP
INTRAMUSCULAR | Status: DC | PRN
  Administered 2018-07-18: 20 mg

## 2018-07-18 MED ORDER — BUPIVACAINE HCL (PF) 0.5 % IJ SOLN
INTRAMUSCULAR | Status: DC | PRN
  Administered 2018-07-18: 3 mL

## 2018-07-18 MED ORDER — ASPIRIN EC 81 MG PO TBEC: 81.0000 mg | DELAYED_RELEASE_TABLET | Freq: Two times a day (BID) | ORAL | 0 refills | Status: DC

## 2018-07-18 MED ORDER — MEPERIDINE HCL 25 MG/ML IJ SOLN: 6.2500 mg | INTRAMUSCULAR | Status: DC | PRN

## 2018-07-18 MED ORDER — IBUPROFEN 600 MG PO TABS: 600.0000 mg | ORAL_TABLET | Freq: Three times a day (TID) | ORAL | 0 refills | Status: DC

## 2018-07-18 SURGICAL SUPPLY — 87 items
BANDAGE ACE 3X5.8 VEL STRL LF (GAUZE/BANDAGES/DRESSINGS) IMPLANT
BANDAGE ACE 4X5 VEL STRL LF (GAUZE/BANDAGES/DRESSINGS) ×3 IMPLANT
BANDAGE ACE 6X5 VEL STRL LF (GAUZE/BANDAGES/DRESSINGS) ×3 IMPLANT
BANDAGE ESMARK 6X9 LF (GAUZE/BANDAGES/DRESSINGS) IMPLANT
BIT DRILL 2.9 CANN QC NONSTRL (BIT) ×2 IMPLANT
BLADE SURG 15 STRL LF DISP TIS (BLADE) ×1 IMPLANT
BLADE SURG 15 STRL SS (BLADE) ×3
BNDG CMPR 9X4 STRL LF SNTH (GAUZE/BANDAGES/DRESSINGS)
BNDG CMPR 9X6 STRL LF SNTH (GAUZE/BANDAGES/DRESSINGS)
BNDG COHESIVE 4X5 TAN STRL (GAUZE/BANDAGES/DRESSINGS) ×3 IMPLANT
BNDG ESMARK 4X9 LF (GAUZE/BANDAGES/DRESSINGS) IMPLANT
BNDG ESMARK 6X9 LF (GAUZE/BANDAGES/DRESSINGS)
CHLORAPREP W/TINT 26ML (MISCELLANEOUS) ×1 IMPLANT
CLOSURE STERI-STRIP 1/2X4 (GAUZE/BANDAGES/DRESSINGS)
CLSR STERI-STRIP ANTIMIC 1/2X4 (GAUZE/BANDAGES/DRESSINGS) IMPLANT
COVER BACK TABLE 60X90IN (DRAPES) ×3 IMPLANT
COVER MAYO STAND STRL (DRAPES) IMPLANT
CUFF TOURNIQUET SINGLE 18IN (TOURNIQUET CUFF) IMPLANT
CUFF TOURNIQUET SINGLE 24IN (TOURNIQUET CUFF) IMPLANT
CUFF TOURNIQUET SINGLE 34IN LL (TOURNIQUET CUFF) ×2 IMPLANT
DECANTER SPIKE VIAL GLASS SM (MISCELLANEOUS) IMPLANT
DRAPE EXTREMITY T 121X128X90 (DRAPE) ×3 IMPLANT
DRAPE IMP U-DRAPE 54X76 (DRAPES) ×3 IMPLANT
DRAPE OEC MINIVIEW 54X84 (DRAPES) ×3 IMPLANT
DRAPE SURG 17X23 STRL (DRAPES) IMPLANT
DRAPE U-SHAPE 47X51 STRL (DRAPES) ×3 IMPLANT
DRILL CANN MAX VPC 3.2MM (DRILL) IMPLANT
DRSG EMULSION OIL 3X3 NADH (GAUZE/BANDAGES/DRESSINGS) ×3 IMPLANT
DURAPREP 26ML APPLICATOR (WOUND CARE) ×2 IMPLANT
ELECT REM PT RETURN 9FT ADLT (ELECTROSURGICAL) ×3
ELECTRODE REM PT RTRN 9FT ADLT (ELECTROSURGICAL) ×1 IMPLANT
GAUZE SPONGE 4X4 12PLY STRL (GAUZE/BANDAGES/DRESSINGS) ×3 IMPLANT
GAUZE XEROFORM 1X8 LF (GAUZE/BANDAGES/DRESSINGS) IMPLANT
GLOVE BIO SURGEON STRL SZ7.5 (GLOVE) ×6 IMPLANT
GLOVE BIOGEL M STRL SZ7.5 (GLOVE) ×2 IMPLANT
GLOVE BIOGEL PI IND STRL 8 (GLOVE) ×2 IMPLANT
GLOVE BIOGEL PI IND STRL 9 (GLOVE) IMPLANT
GLOVE BIOGEL PI INDICATOR 8 (GLOVE) ×6
GLOVE BIOGEL PI INDICATOR 9 (GLOVE) ×2
GOWN STRL REUS W/ TWL LRG LVL3 (GOWN DISPOSABLE) ×1 IMPLANT
GOWN STRL REUS W/ TWL XL LVL3 (GOWN DISPOSABLE) ×1 IMPLANT
GOWN STRL REUS W/TWL LRG LVL3 (GOWN DISPOSABLE) ×3
GOWN STRL REUS W/TWL XL LVL3 (GOWN DISPOSABLE) ×8 IMPLANT
K-WIRE ACE 1.6X6 (WIRE) ×3
K-WIRE COCR 1.4 X 127 (WIRE) ×3
KWIRE ACE 1.6X6 (WIRE) IMPLANT
KWIRE COCR 1.4 X 127 (WIRE) IMPLANT
MAX VPC CANN DRILL 3.2MM (DRILL) ×3
NDL HYPO 25X1 1.5 SAFETY (NEEDLE) ×1 IMPLANT
NEEDLE HYPO 25X1 1.5 SAFETY (NEEDLE) ×3 IMPLANT
NS IRRIG 1000ML POUR BTL (IV SOLUTION) ×3 IMPLANT
PACK BASIN DAY SURGERY FS (CUSTOM PROCEDURE TRAY) ×3 IMPLANT
PAD CAST 3X4 CTTN HI CHSV (CAST SUPPLIES) IMPLANT
PAD CAST 4YDX4 CTTN HI CHSV (CAST SUPPLIES) ×1 IMPLANT
PADDING CAST ABS 4INX4YD NS (CAST SUPPLIES) ×2
PADDING CAST ABS 6INX4YD NS (CAST SUPPLIES) ×4
PADDING CAST ABS COTTON 4X4 ST (CAST SUPPLIES) IMPLANT
PADDING CAST ABS COTTON 6X4 NS (CAST SUPPLIES) ×2 IMPLANT
PADDING CAST COTTON 3X4 STRL (CAST SUPPLIES)
PADDING CAST COTTON 4X4 STRL (CAST SUPPLIES) ×3
PENCIL BUTTON HOLSTER BLD 10FT (ELECTRODE) ×3 IMPLANT
SCREW ACE CAN 4.0 30M (Screw) ×2 IMPLANT
SCREW VPC 4.0X22 (Screw) ×2 IMPLANT
SHEET MEDIUM DRAPE 40X70 STRL (DRAPES) ×2 IMPLANT
SLEEVE SCD COMPRESS KNEE MED (MISCELLANEOUS) ×2 IMPLANT
SPLINT FAST PLASTER 5X30 (CAST SUPPLIES) ×40
SPLINT PLASTER CAST FAST 5X30 (CAST SUPPLIES) IMPLANT
SPONGE LAP 18X18 X RAY DECT (DISPOSABLE) ×2 IMPLANT
SPONGE LAP 4X18 RFD (DISPOSABLE) ×3 IMPLANT
STOCKINETTE 6  STRL (DRAPES)
STOCKINETTE 6 STRL (DRAPES) IMPLANT
SUCTION FRAZIER HANDLE 10FR (MISCELLANEOUS) ×2
SUCTION TUBE FRAZIER 10FR DISP (MISCELLANEOUS) IMPLANT
SUT ETHILON 3 0 PS 1 (SUTURE) ×4 IMPLANT
SUT MON AB 2-0 CT1 36 (SUTURE) IMPLANT
SUT MON AB 4-0 PC3 18 (SUTURE) IMPLANT
SUT PROLENE 3 0 PS 2 (SUTURE) IMPLANT
SUT VIC AB 2-0 SH 27 (SUTURE)
SUT VIC AB 2-0 SH 27XBRD (SUTURE) IMPLANT
SUT VIC AB 3-0 FS2 27 (SUTURE) IMPLANT
SYR 20CC LL (SYRINGE) IMPLANT
SYR BULB 3OZ (MISCELLANEOUS) ×3 IMPLANT
TOWEL GREEN STERILE FF (TOWEL DISPOSABLE) ×3 IMPLANT
TOWEL OR NON WOVEN STRL DISP B (DISPOSABLE) ×3 IMPLANT
TUBE CONNECTING 20'X1/4 (TUBING) ×1
TUBE CONNECTING 20X1/4 (TUBING) ×1 IMPLANT
UNDERPAD 30X30 (UNDERPADS AND DIAPERS) ×3 IMPLANT

## 2018-07-18 NOTE — Anesthesia Postprocedure Evaluation (Signed)
Anesthesia Post Note  Patient: [name]  Procedure(s) Performed: OPEN REDUCTION INTERNAL FIXATION (ORIF) RIGHT TARSAL METATARSAL FRACTURE and DEPO INJECTION (Right Foot)     Patient location during evaluation: PACU Anesthesia Type: General Level of consciousness: awake and alert Pain management: pain level controlled Vital Signs Assessment: post-procedure vital signs reviewed and stable Respiratory status: spontaneous breathing, nonlabored ventilation and respiratory function stable Cardiovascular status: blood pressure returned to baseline and stable Postop Assessment: no apparent nausea or vomiting Anesthetic complications: no    Last Vitals:  Vitals:   07/18/18 1700 07/18/18 1715  BP: 136/87 140/88  Pulse: (!) 107 (!) 112  Resp: (!) 25 20  Temp:  36.7 C  SpO2: 100% 100%    Last Pain:  Vitals:   07/18/18 1715  TempSrc:   PainSc: 0-No pain                 Catalina Gravel

## 2018-07-18 NOTE — Anesthesia Procedure Notes (Signed)
Procedure Name: LMA Insertion Date/Time: 07/18/2018 3:41 PM Performed by: Willa Frater, CRNA Pre-anesthesia Checklist: Patient identified, Emergency Drugs available, Suction available and Patient being monitored Patient Re-evaluated:Patient Re-evaluated prior to induction Oxygen Delivery Method: Circle system utilized Preoxygenation: Pre-oxygenation with 100% oxygen Induction Type: IV induction Ventilation: Mask ventilation without difficulty LMA: LMA inserted LMA Size: 4.0 Number of attempts: 1 Airway Equipment and Method: Bite block Placement Confirmation: positive ETCO2 Tube secured with: Tape Dental Injury: Teeth and Oropharynx as per pre-operative assessment
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
## 2018-07-18 NOTE — Discharge Instructions (Signed)
Elevate leg - Toes above nose as much as possible to reduce pain / swelling.  You may loosen and re-apply ace wrap if it feels too tight.  Weight Bearing:  Non weight bearing affected leg.  Diet: As you were doing prior to hospitalization   Shower:  You have a splint on, leave the splint in place and keep the splint dry with a plastic bag.  Dressing:  You have a splint. Leave the splint in place and we will change your bandages during your first follow-up appointment.    Activity:  Increase activity slowly as tolerated, but follow the weight bearing instructions below.  The rules on driving is that you can not be taking narcotics while you drive, and you must feel in control of the vehicle.    To prevent constipation:  Narcotic medicines cause constipation.  Wean these as soon as is appropriate.   You may use a stool softener such as -  Colace (over the counter) 100 mg by mouth twice a day  Drink plenty of fluids (prune juice may be helpful) and high fiber foods Miralax (over the counter) for constipation as needed.    Itching:  If you experience itching with your medications, try taking only a single pain pill, or even half a pain pill at a time.  You can also use benadryl over the counter for itching or also to help with sleep.   Precautions:  If you experience chest pain or shortness of breath - call 911 immediately for transfer to the hospital emergency department!!  If you develop a fever greater that 101 F, purulent drainage from wound, increased redness or drainage from wound, or calf pain -- Call the office at [phone]                                                 Follow- Up Appointment:  Monday in the office   Post Anesthesia Home Care Instructions  Activity: Get plenty of rest for the remainder of the day. A responsible individual must stay with you for 24 hours following the procedure.  For the next 24 hours, DO NOT: -Drive a car -Paediatric nurse -Drink  alcoholic beverages -Take any medication unless instructed by your physician -Make any legal decisions or sign important papers.  Meals: Start with liquid foods such as gelatin or soup. Progress to regular foods as tolerated. Avoid greasy, spicy, heavy foods. If nausea and/or vomiting occur, drink only clear liquids until the nausea and/or vomiting subsides. Call your physician if vomiting continues.  Special Instructions/Symptoms: Your throat may feel dry or sore from the anesthesia or the breathing tube placed in your throat during surgery. If this causes discomfort, gargle with warm salt water. The discomfort should disappear within 24 hours.  If you had a scopolamine patch placed behind your ear for the management of post- operative nausea and/or vomiting:  1. The medication in the patch is effective for 72 hours, after which it should be removed.  Wrap patch in a tissue and discard in the trash. Wash hands thoroughly with soap and water. 2. You may remove the patch earlier than 72 hours if you experience unpleasant side effects which may include dry mouth, dizziness or visual disturbances. 3. Avoid touching the patch. Wash your hands with soap and water after contact with the patch.  Regional Anesthesia Blocks  1. Numbness or the inability to move the "blocked" extremity may last from 3-48 hours after placement. The length of time depends on the medication injected and your individual response to the medication. If the numbness is not going away after 48 hours, call your surgeon.  2. The extremity that is blocked will need to be protected until the numbness is gone and the  Strength has returned. Because you cannot feel it, you will need to take extra care to avoid injury. Because it may be weak, you may have difficulty moving it or using it. You may not know what position it is in without looking at it while the block is in effect.  3. For blocks in the legs and feet, returning to  weight bearing and walking needs to be done carefully. You will need to wait until the numbness is entirely gone and the strength has returned. You should be able to move your leg and foot normally before you try and bear weight or walk. You will need someone to be with you when you first try to ensure you do not fall and possibly risk injury.  4. Bruising and tenderness at the needle site are common side effects and will resolve in a few days.  5. Persistent numbness or new problems with movement should be communicated to the surgeon or the [facility] [phone]).

## 2018-07-18 NOTE — Anesthesia Preprocedure Evaluation (Addendum)
Anesthesia Evaluation  Patient identified by MRN, date of birth, ID band Patient awake    Reviewed: Allergy & Precautions, NPO status , Patient's Chart, lab work & pertinent test results  History of Anesthesia Complications (+) PONV and history of anesthetic complications  Airway Mallampati: II  TM Distance: >3 FB Neck ROM: Full    Dental  (+) Teeth Intact, Dental Advisory Given   Pulmonary asthma ,    breath sounds clear to auscultation       Cardiovascular hypertension,  Rhythm:Regular Rate:Normal  Dysautonomia    Neuro/Psych  Headaches, PSYCHIATRIC DISORDERS Anxiety Depression    GI/Hepatic Neg liver ROS, hiatal hernia, GERD  Medicated,  Endo/Other  negative endocrine ROS  Renal/GU Renal disease     Musculoskeletal  (+) Arthritis ,   Abdominal   Peds  Hematology negative hematology ROS (+)   Anesthesia Other Findings   Reproductive/Obstetrics negative OB ROS                            Anesthesia Physical  Anesthesia Plan  ASA: III  Anesthesia Plan: General   Post-op Pain Management:  Regional for Post-op pain   Induction: Intravenous  PONV Risk Score and Plan: 4 or greater and Ondansetron, Dexamethasone, Midazolam and Scopolamine patch - Pre-op  Airway Management Planned: LMA  Additional Equipment:   Intra-op Plan:   Post-operative Plan: Extubation in OR  Informed Consent: I have reviewed the patients History and Physical, chart, labs and discussed the procedure including the risks, benefits and alternatives for the proposed anesthesia with the patient or authorized representative who has indicated his/her understanding and acceptance.   Dental advisory given  Plan Discussed with: CRNA  Anesthesia Plan Comments:       Anesthesia Quick Evaluation

## 2018-07-18 NOTE — Transfer of Care (Signed)
Immediate Anesthesia Transfer of Care Note  Patient: [name]  Procedure(s) Performed: OPEN REDUCTION INTERNAL FIXATION (ORIF) RIGHT TARSAL METATARSAL FRACTURE and DEPO INJECTION (Right Foot)  Patient Location: PACU  Anesthesia Type:GA combined with regional for post-op pain  Level of Consciousness: awake, alert , oriented and drowsy  Airway & Oxygen Therapy: Patient Spontanous Breathing and Patient connected to face mask oxygen  Post-op Assessment: Report given to RN and Post -op Vital signs reviewed and unstable, Anesthesiologist notified  Post vital signs: Reviewed and stable  Last Vitals:  Vitals Value Taken Time  BP 148/95 07/18/2018  4:38 PM  Temp    Pulse 119 07/18/2018  4:39 PM  Resp 17 07/18/2018  4:39 PM  SpO2 100 % 07/18/2018  4:39 PM  Vitals shown include unvalidated device data.  Last Pain:  Vitals:   07/18/18 1318  TempSrc: Oral  PainSc: 6       Patients Stated Pain Goal: 1 (61/60/73 7106)  Complications: No apparent anesthesia complications

## 2018-07-18 NOTE — Anesthesia Procedure Notes (Addendum)
Anesthesia Regional Block: Popliteal block   Pre-Anesthetic Checklist: ,, timeout performed, Correct Patient, Correct Site, Correct Laterality, Correct Procedure, Correct Position, site marked, Risks and benefits discussed,  Surgical consent,  Pre-op evaluation,  At surgeon's request and post-op pain management  Laterality: Right  Prep: alcohol swabs       Needles:  Injection technique: Single-shot  Needle Type: Echogenic Needle     Needle Length: 9cm  Needle Gauge: 21     Additional Needles:   Procedures:,,,, ultrasound used (permanent image in chart),,,,  Narrative:  Start time: 07/18/2018 2:18 PM End time: 07/18/2018 2:25 PM Injection made incrementally with aspirations every 5 mL.  Performed by: Personally  Anesthesiologist: Catalina Gravel, MD  Additional Notes: No pain on injection. No increased resistance to injection. Injection made in 5cc increments.  Good needle visualization.  Patient tolerated procedure well.  COMBINED RIGHT POPLITEAL/SAPHENOUS NERVE BLOCK.

## 2018-07-18 NOTE — Progress Notes (Signed)
Assisted Dr. Gifford Shave with right, ultrasound guided, popliteal/saphenous block. Side rails up, monitors on throughout procedure. See vital signs in flow sheet. Tolerated Procedure well.

## 2018-07-19 ENCOUNTER — Encounter: Payer: Self-pay | Admitting: Family Medicine

## 2018-07-20 ENCOUNTER — Ambulatory Visit
Admission: RE | Admit: 2018-07-20 | Discharge: 2018-07-20 | Disposition: A | Discharge status: Home / Self Care | Payer: 59 | Source: Ambulatory Visit | Attending: Neurosurgery | Admitting: Neurosurgery

## 2018-07-20 DIAGNOSIS — M5416 Radiculopathy, lumbar region: Secondary | ICD-10-CM

## 2018-07-21 ENCOUNTER — Other Ambulatory Visit: Payer: Self-pay | Admitting: Neurosurgery

## 2018-07-21 ENCOUNTER — Ambulatory Visit
Admission: RE | Admit: 2018-07-21 | Discharge: 2018-07-21 | Disposition: A | Discharge status: Home / Self Care | Payer: 59 | Source: Ambulatory Visit | Attending: Neurosurgery | Admitting: Neurosurgery

## 2018-07-21 ENCOUNTER — Other Ambulatory Visit (HOSPITAL_COMMUNITY): Payer: Self-pay | Admitting: Neurosurgery

## 2018-07-21 DIAGNOSIS — M5412 Radiculopathy, cervical region: Secondary | ICD-10-CM

## 2018-07-21 DIAGNOSIS — M5414 Radiculopathy, thoracic region: Secondary | ICD-10-CM

## 2018-07-21 DIAGNOSIS — G959 Disease of spinal cord, unspecified: Secondary | ICD-10-CM | POA: Diagnosis not present

## 2018-07-21 NOTE — Op Note (Signed)
07/18/2018  8:17 AM  PATIENT:  [name]    PRE-OPERATIVE DIAGNOSIS:  FRACTURE RIGHT TARSAL METATARSAL  POST-OPERATIVE DIAGNOSIS:  Same  PROCEDURE:  OPEN REDUCTION INTERNAL FIXATION (ORIF) RIGHT TARSAL METATARSAL FRACTURE and DEPO INJECTION  SURGEON:  Eryca Bolte, Ernesta Amble, MD  ASSISTANT:   Roxan Hockey, PA-C, he was present and scrubbed throughout the case, critical for completion in a timely fashion, and for retraction, instrumentation, and closure.  CO Surgeon: Meridee Score MD  ANESTHESIA:   gen  PREOPERATIVE INDICATIONS:  [name] is a  42 y.o. female with a diagnosis of [place] who failed conservative measures and elected for surgical management.    The risks benefits and alternatives were discussed with the patient preoperatively including but not limited to the risks of infection, bleeding, nerve injury, cardiopulmonary complications, the need for revision surgery, among others, and the patient was willing to proceed.  OPERATIVE IMPLANTS: Biomet headless and headed cannulated screws  OPERATIVE FINDINGS: unstable lis franc and unstable 1st TMT joint  BLOOD LOSS: min  COMPLICATIONS: none  TOURNIQUET TIME: none  OPERATIVE PROCEDURE:  Patient was identified in the preoperative holding area and site was marked by me She was transported to the operating theater and placed on the table in supine position taking care to pad all bony prominences. After a preincinduction time out anesthesia was induced. The right lower extremity was prepped and draped in normal sterile fashion and a pre-incision timeout was performed. She received ancef for preoperative antibiotics.   I made a dorsal incision over her first metatarsal and TMT joint.  I obtained good hemostasis made a incision through the extensor tendon sheath I retracted this tendon and protected throughout the remainder of the case.  Identified Hort her first metatarsal the base of her first  metatarsal had a slightly abnormal appearance consistent with possible prolonged injury or disuse osteopenia.  I stressed her first TMT joint it was unstable so I elected to stabilize this at brace placed a K wire across it while holding in a reduced position.  Next I measured the depth and placed a headless screw across this joint.  I was happy with the position of this on fluoroscopy  Next side examine her Lisfranc joint there was scar tissue in the joint and it was unstable I debrided the scar tissue and it was able to be held in a clot in a reduced position.  I placed a K wire across this and then placed a headed partially-threaded lag screw across the medial cuneiform and second metatarsal base as happy with the reduction of the Lisfranc joint here.  Took multiple x-rays as the happy with the reduction and stability of all first and second TMT joints as well as the placement of all hardware.  I thoroughly irrigated her wounds I closed her skin with a nylon stitch.  I injected 20 mg of Depo-Medrol into her third fourth intermetatarsal space around her Morton's neuroma.  Sterile dressings were applied she was awoken and taken the PACU in stable condition  POST OPERATIVE PLAN: NWB, ASA for dvt px

## 2018-07-22 ENCOUNTER — Ambulatory Visit (INDEPENDENT_AMBULATORY_CARE_PROVIDER_SITE_OTHER): Payer: 59 | Admitting: Internal Medicine

## 2018-07-22 ENCOUNTER — Encounter: Payer: Self-pay | Admitting: Internal Medicine

## 2018-07-22 VITALS — BP 122/72 | HR 82 | Ht 64.0 in

## 2018-07-22 DIAGNOSIS — R55 Syncope and collapse: Secondary | ICD-10-CM

## 2018-07-22 DIAGNOSIS — G901 Familial dysautonomia [Riley-Day]: Secondary | ICD-10-CM | POA: Diagnosis not present

## 2018-07-22 DIAGNOSIS — I10 Essential (primary) hypertension: Secondary | ICD-10-CM

## 2018-07-22 NOTE — Progress Notes (Signed)
HPI [name] returns today for followup of autonomic dysfunction. She is a very pleasant 42 year old woman with a history of autonomic dysfunction, who is also known to have hyperemesis gravidarum. She has a h/o PE, and her autonomic dysfunction has been reasonably well controlled with midodrine. After she moved to [place], she developed more HTN and came off of her midodrine. She has done well from the perspective of her autonomic dysfunction. She had surgery on her right leg and is still in a cast and crutches. She is traveling back to [place] where she lives with her husband and family later today.  Allergies  Allergen Reactions  . Clarithromycin Other (See Comments)    Other reaction(s): Bleeding Cant take due to Chrons disease Other reaction(s): Bleeding Other reaction(s): Bleeding   . Codeine Nausea And Vomiting and Other (See Comments)    Other reaction(s): Bleeding Dilaudid and demerol OK Other reaction(s): Bleeding Other reaction(s): Bleeding   . Metoclopramide Anaphylaxis and Other (See Comments)    Other reaction(s): GI Upset (intolerance) Intensifies her Chron's Other reaction(s): Bleeding Chron's disease reaction  . Prochlorperazine Edisylate Anaphylaxis and Swelling  . Remicade [Infliximab] Other (See Comments)    Serum Sickness  Couldn't walk, open mouth, pain,   . Sulfa Antibiotics Hives and Other (See Comments)    Hematemesis; documented while a young child Other reaction(s): GI Intolerance Hematemesis; documented while a young child  Other reaction(s): Bleeding  . Sulfamethoxazole Rash and Other (See Comments)    Internal bleeding and kidney infection  . Chlorhexidine Gluconate     Other reaction(s): Redness Other reaction(s): Redness  . Doxycycline Nausea And Vomiting  . Hydrocodone Nausea And Vomiting  . Nitrofurantoin Nausea And Vomiting    Other reaction(s): GI Upset (intolerance)  . Other Rash    GuardIVa - PICC line antimicrobial dressing    Redness Resultant infection with PICC line x2 with irritation from adhesive "chloraprep" Other reaction(s): Redness Resultant infection with PICC line x2 with irritation from adhesive Other reaction(s): Redness GuardIVa - PICC line antimicrobial dressing   . Oxycodone Nausea And Vomiting  . Promethazine Rash and Other (See Comments)    Muscular seizures Other reaction(s): Other "muscle twitches" Muscle spasms     Current Outpatient Medications  Medication Sig Dispense Refill  . acetaminophen (TYLENOL) 500 MG tablet Take 2 tablets (1,000 mg total) by mouth every 8 (eight) hours for 14 days. For Pain. 84 tablet 0  . ALPRAZolam (XANAX) 0.25 MG tablet TAKE 2 TABLETS BY MOUTH EVERY DAY AS NEEDED 180 tablet 1  . amLODipine (NORVASC) 5 MG tablet Take 5 mg by mouth daily.    Marland Kitchen aspirin EC 81 MG tablet Take 1 tablet (81 mg total) by mouth 2 (two) times daily. For DVT prophylaxis 60 tablet 0  . benazepril (LOTENSIN) 10 MG tablet Take 10 mg by mouth daily.    . butalbital-acetaminophen-caffeine (FIORICET, ESGIC) 50-325-40 MG tablet TAKE TWO CAPSULES BY MOUTH EVERY 6 HOURS AS NEEDED FOR MIGRAINES 60 tablet 1  . cetirizine (ZYRTEC) 10 MG tablet Take 1 tablet (10 mg total) by mouth at bedtime. 90 tablet 3  . cyanocobalamin (,VITAMIN B-12,) 1000 MCG/ML injection Inject 1 mL (1,000 mcg total) into the muscle every 14 (fourteen) days. 6 mL 3  . cyclobenzaprine (FLEXERIL) 5 MG tablet Take 1 tablet (5 mg total) by mouth at bedtime. 90 tablet 1  . dexlansoprazole (DEXILANT) 60 MG capsule Take 1 capsule (60 mg total) by mouth daily. 90 capsule 3  .  docusate sodium (COLACE) 100 MG capsule Take 1 capsule (100 mg total) by mouth 2 (two) times daily. To prevent constipation while taking pain medication. 60 capsule 0  . gabapentin (NEURONTIN) 300 MG capsule Take 1 capsule (300 mg total) by mouth 2 (two) times daily for 14 days. For 2 weeks post op for pain. 28 capsule 0  . guaiFENesin-codeine 100-10 MG/5ML  syrup Take 5 mLs by mouth at bedtime as needed for cough. 50 mL 0  . HYDROmorphone (DILAUDID) 2 MG tablet Take 1 tablet (2 mg total) by mouth every 6 (six) hours as needed for up to 7 days for severe pain (For Breakthrough pain only). 30 tablet 0  . ibuprofen (ADVIL,MOTRIN) 600 MG tablet Take 1 tablet (600 mg total) by mouth 3 (three) times daily. For pain / inflammation. 40 tablet 0  . methocarbamol (ROBAXIN) 500 MG tablet Take 1 tablet (500 mg total) by mouth every 6 (six) hours as needed for muscle spasms. 40 tablet 0  . montelukast (SINGULAIR) 10 MG tablet Take 1 tablet (10 mg total) by mouth at bedtime. 90 tablet 3  . ondansetron (ZOFRAN) 4 MG tablet Take 1 tablet (4 mg total) by mouth every 8 (eight) hours as needed for nausea or vomiting. 40 tablet 0  . PARoxetine (PAXIL) 20 MG tablet Take 1 tablet (20 mg total) by mouth daily. 90 tablet 3  . zolpidem (AMBIEN) 10 MG tablet Take 1 tablet (10 mg total) by mouth at bedtime. 90 tablet 1  . albuterol (PROAIR HFA) 108 (90 Base) MCG/ACT inhaler Inhale 2 puffs into the lungs every 4 (four) hours as needed for wheezing or shortness of breath. (Patient not taking: Reported on 07/22/2018) 1 Inhaler 0  . albuterol (PROVENTIL) (2.5 MG/3ML) 0.083% nebulizer solution Take 3 mLs (2.5 mg total) by nebulization every 6 (six) hours as needed for wheezing or shortness of breath. (Patient not taking: Reported on 07/22/2018) 450 mL 3   Current Facility-Administered Medications  Medication Dose Route Frequency Provider Last Rate Last Dose  . 0.9 %  sodium chloride infusion  500 mL Intravenous Continuous Armbruster, Carlota Raspberry, MD         Past Medical History:  Diagnosis Date  . Anxiety   . Asthma   . Chest pain    a. s/p intermediate risk nuclear stress test on 09/23/14 and normal LHC on 09/24/14   . Chronic nausea   . Crohn disease ([place]) 2000  . Dysautonomia ([place])    recurrent syncope s/p ILR by Dr Doreatha Lew  . Endometriosis   . Essential hypertension 03/17/2017    a - Renal artery [name] 3/18: normal    . GERD (gastroesophageal reflux disease)   . H/O hiatal hernia   . Migraine   . Palpitations   . PONV (postoperative nausea and vomiting)   . Pulmonary embolism affecting pregnancy   . Vertigo     ROS:   All systems reviewed and negative except as noted in the HPI.   Past Surgical History:  Procedure Laterality Date  . [place] STUDY N/A 12/31/2016   Procedure: [place] STUDY;  Surgeon: Manus Gunning, MD;  Location: [facility];  Service: Gastroenterology;  Laterality: N/A;  . ANTERIOR CERVICAL DECOMP/DISCECTOMY FUSION N/A 07/19/2017   Procedure: Cervical five-six Anterior cervical decompression/discectomy/fusion;  Surgeon: Erline Levine, MD;  Location: [facility];  Service: Neurosurgery;  Laterality: N/A;  C5-6 Anterior cervical decompression/discectomy/fusion  . APPENDECTOMY  ~ 1992  . CARDIAC CATHETERIZATION    .  CESAREAN SECTION  2011   emergency  CS due to placental abruption  . CESAREAN SECTION  2014   "preeclampsia"  . COLON SURGERY    . CYSTOSCOPY W/ STONE MANIPULATION  X 2  . ENDOSCOPIC RELEASE TRANSVERSE CARPAL LIGAMENT OF HAND Right 12/2010  . ESOPHAGEAL MANOMETRY N/A 12/31/2016   Procedure: ESOPHAGEAL MANOMETRY (EM);  Surgeon: Manus Gunning, MD;  Location: [facility];  Service: Gastroenterology;  Laterality: N/A;  . ILEOCECETOMY  2002 X 2  . KNEE ARTHROSCOPY Bilateral 1988-1990  . LAPAROSCOPY FOR ECTOPIC PREGNANCY  11/2012  . LEFT HEART CATHETERIZATION WITH CORONARY ANGIOGRAM N/A 09/24/2014   Procedure: LEFT HEART CATHETERIZATION WITH CORONARY ANGIOGRAM;  Surgeon: Leonie Man, MD;  Location: [facility] CATH [facility];  Service: Cardiovascular;  Laterality: N/A;  . LOOP RECORDER IMPLANT  11/2009   by DR Doreatha Lew  . LOOP RECORDER REMOVAL N/A 07/05/2017   Procedure: Loop Recorder Removal;  Surgeon: Evans Lance, MD;  Location: [facility];  Service: Cardiovascular;  Laterality: N/A;  . NASAL SINUS SURGERY  ~ 2007  .  PARTIAL COLECTOMY  2002   partial removed due to crohns  . TONSILLECTOMY  ~ 1996  . US ECHOCARDIOGRAPHY  11/11/2009   EF 55-60%     Family History  Problem Relation Age of Onset  . Hypertension Mother   . Hyperlipidemia Mother   . Arthritis Mother   . Diabetes Father   . Coronary artery disease Father   . Heart disease Father 17       MI age 30  . Hyperlipidemia Brother   . Hypertension Brother   . Colon cancer Paternal Grandmother      Social History   Socioeconomic History  . Marital status: Married    Spouse name: Jonni Sanger  . Number of children: 2  . Years of education: college  . Highest education level: Not on file  Occupational History    Employer: OTHER    Comment: Airline pilot    Employer: [employer]  . Financial resource strain: Not on file  . Food insecurity:    Worry: Not on file    Inability: Not on file  . Transportation needs:    Medical: Not on file    Non-medical: Not on file  Tobacco Use  . Smoking status: Never Smoker  . Smokeless tobacco: Never Used  Substance and Sexual Activity  . Alcohol use: Yes    Alcohol/week: 0.6 oz    Types: 1 Glasses of wine per week    Comment: 1 glass per week  . Drug use: No  . Sexual activity: Yes    Birth control/protection: Surgical  Lifestyle  . Physical activity:    Days per week: Not on file    Minutes per session: Not on file  . Stress: Not on file  Relationships  . Social connections:    Talks on phone: Not on file    Gets together: Not on file    Attends religious service: Not on file    Active member of club or organization: Not on file    Attends meetings of clubs or organizations: Not on file    Relationship status: Not on file  . Intimate partner violence:    Fear of current or ex partner: Not on file    Emotionally abused: Not on file    Physically abused: Not on file    Forced sexual activity: Not on file  Other Topics Concern  .  Not on file  Social  History Narrative   Patient lives at home with her husband Jonni Sanger). Patient works full time for [employer].    Education [employer].   Right handed.   Caffeine one cup of coffee daily.     BP 122/72   Pulse 82   Ht 5' 4"  (1.626 m)   SpO2 99%   BMI 26.78 kg/m   Physical Exam:  Well appearing 42 yo woman, NAD HEENT: Unremarkable Neck:  6 cm JVD, no thyromegally Lymphatics:  No adenopathy Back:  No CVA tenderness Lungs:  Clear with no wheezes HEART:  Regular rate rhythm, no murmurs, no rubs, no clicks Abd:  soft, positive bowel sounds, no organomegally, no rebound, no guarding Ext:  2 plus pulses, no edema, no cyanosis, no clubbing, right leg in a cast Skin:  No rashes no nodules Neuro:  CN II through XII intact, motor grossly intact  EKG - NSR with no abnormalities   Assess/Plan: 1. Syncope/autonomic dysfunction - her symptoms are fairly stable. She will undergo watchful waiting. She will avoid dehydration and diuretic therapy. 2. HTN - she appears to be doing well with amlodipine and an ACE inhibitor.  Mikle Bosworth.D.

## 2018-07-22 NOTE — Patient Instructions (Signed)

## 2019-06-12 IMAGING — MR MR THORACIC SPINE W/O CM
9 of 11 series · 39 of 48 positions shown · non-contrast
Comparison: None.

CLINICAL DATA: Sudden right-sided paralysis.

EXAM:
MRI CERVICAL SPINE WITHOUT CONTRAST
MRI THORACIC SPINE WITHOUT CONTRAST
TECHNIQUE: Multiplanar and multiecho pulse sequences of the cervical and
thoracic spine were obtained without intravenous contrast.

[Series 3: T2 · sagittal · 3.0mm · 0.70mm/px · 4 of 15 slices shown (1 of 4)]
[im 1/15]
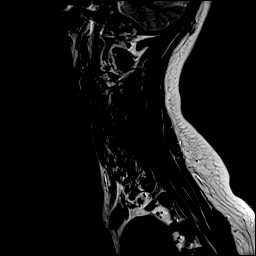
[im 5/15]
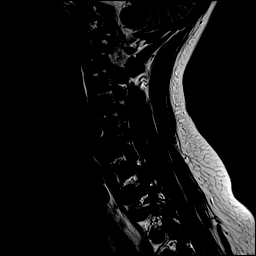
[im 10/15]
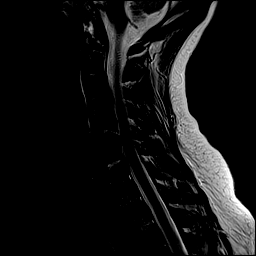
[im 15/15]
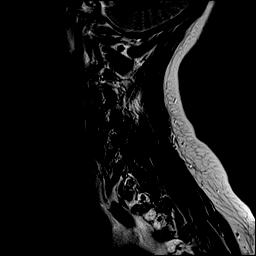

[Series 4: T1 · sagittal · 3.0mm · 0.70mm/px · 3 of 15 slices shown (1 of 2)]
[im 1/15]
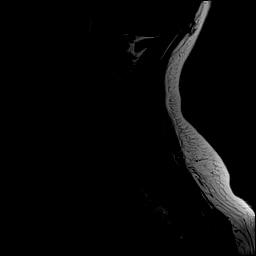
[im 8/15]
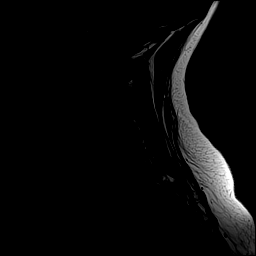
[im 15/15]
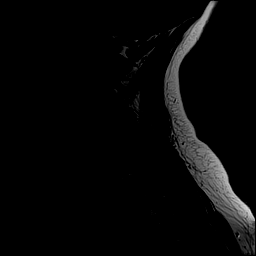

[Series 5: STIR · sagittal · 3.0mm · 0.35mm/px · 3 of 15 slices shown (1 of 2)]
[im 1/15]
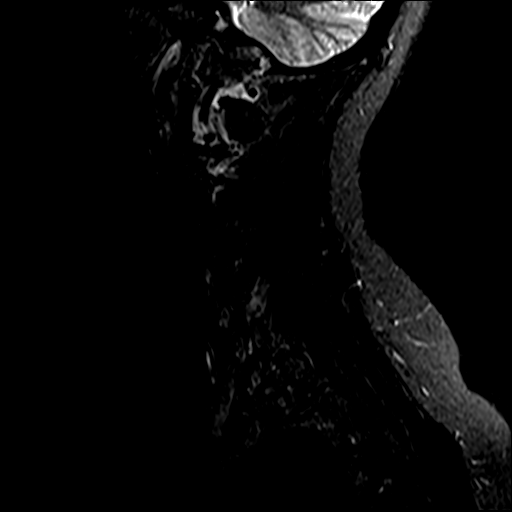
[im 8/15]
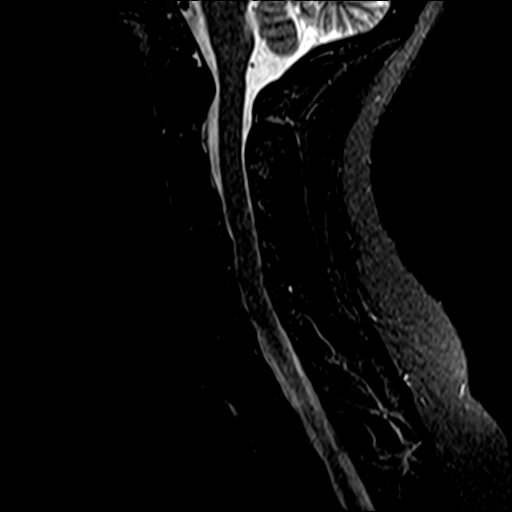
[im 15/15]
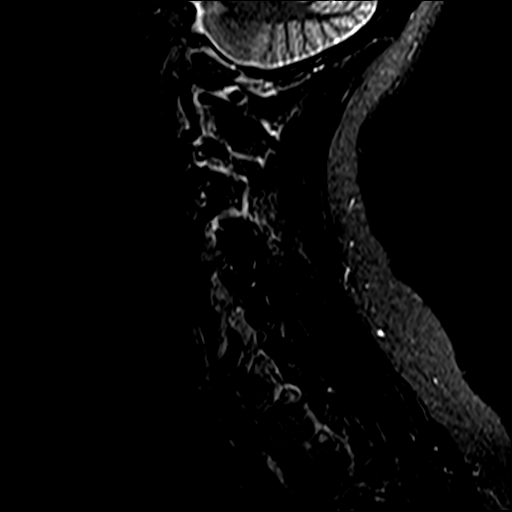

[Series 6: T2 · axial · 3.0mm · 0.70mm/px · z∈[-25,+75]mm · 6 of 28 slices shown (2 of 4)]
[im 1/28]
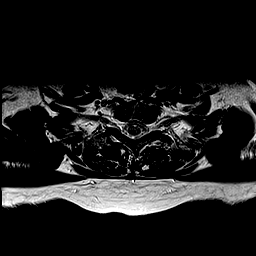
[im 6/28]
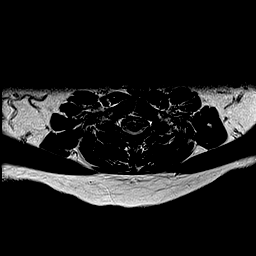
[im 11/28]
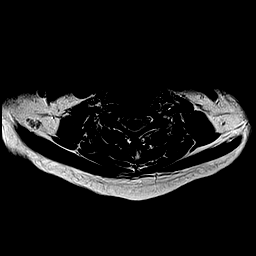
[im 17/28]
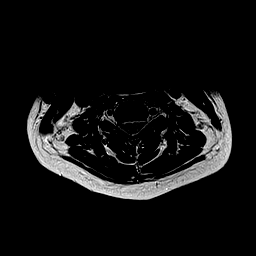
[im 22/28]
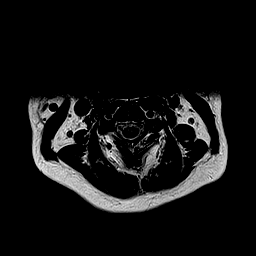
[im 28/28]
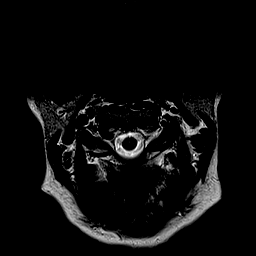

[Series 7: mpgr ax · axial · 3.0mm · 0.35mm/px · z∈[-25,+75]mm · 6 of 28 slices shown]
[im 1/28]
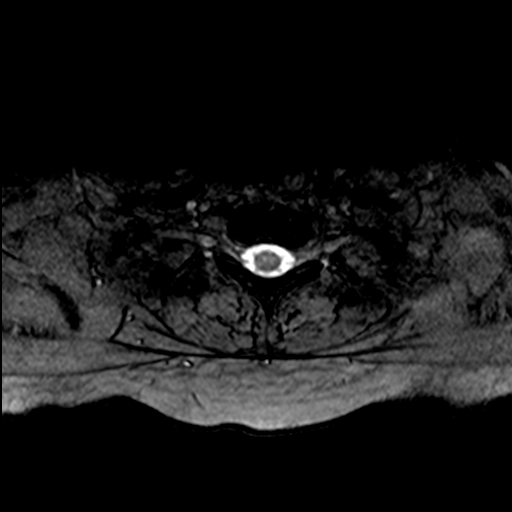
[im 6/28]
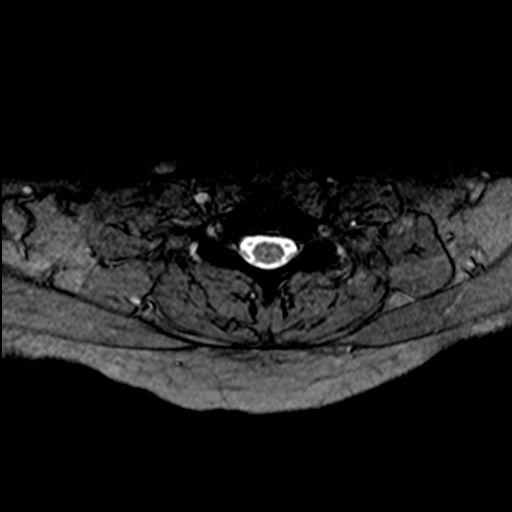
[im 11/28]
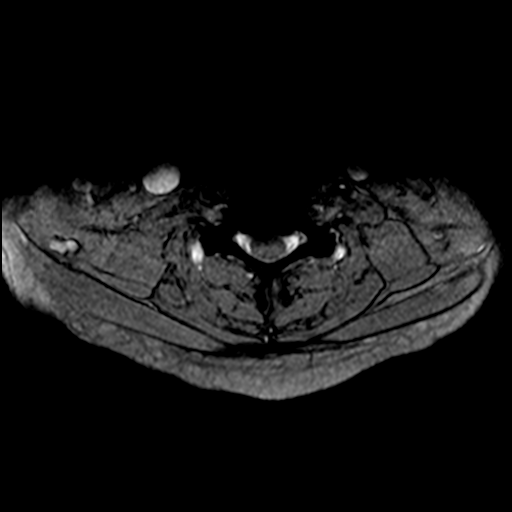
[im 17/28]
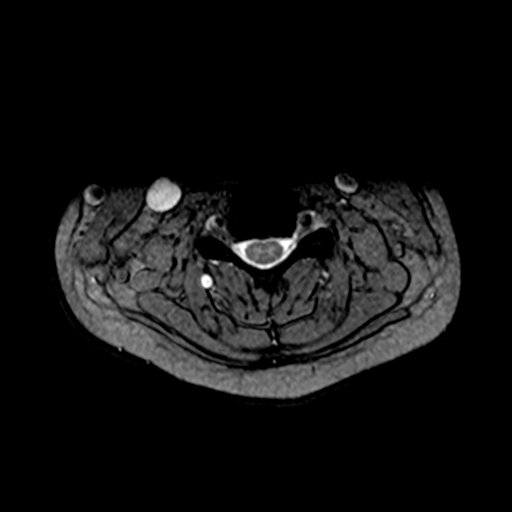
[im 22/28]
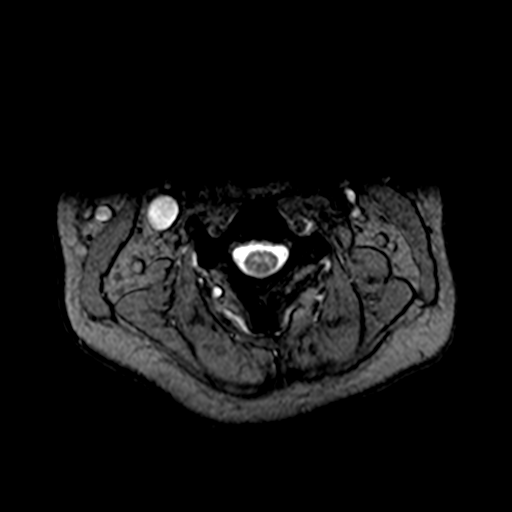
[im 28/28]
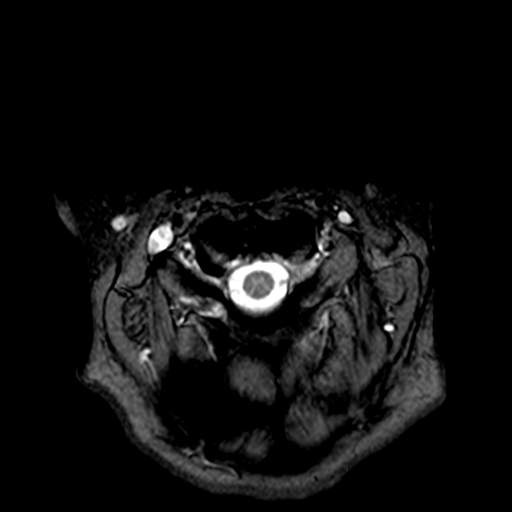

[Series 11: T2 · sagittal · 4.0mm · 1.06mm/px · 3 of 13 slices shown (3 of 4)]
[im 1/13]
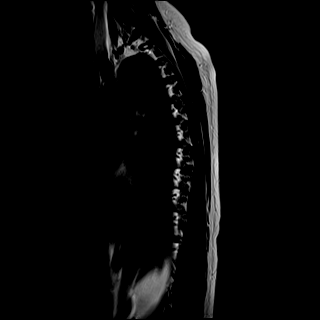
[im 7/13]
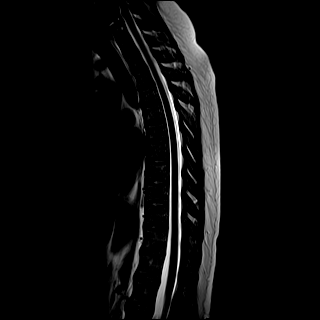
[im 13/13]
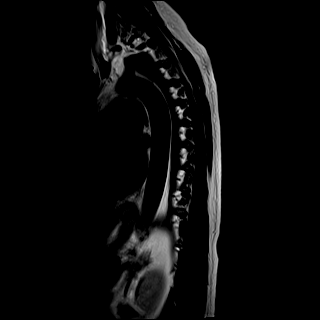

[Series 12: T1 · sagittal · 4.0mm · 1.06mm/px · 3 of 13 slices shown (2 of 2)]
[im 1/13]
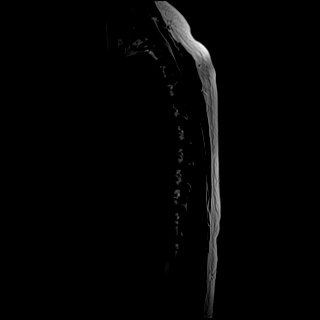
[im 7/13]
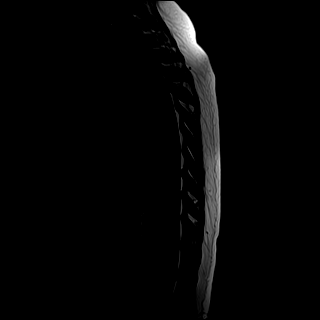
[im 13/13]
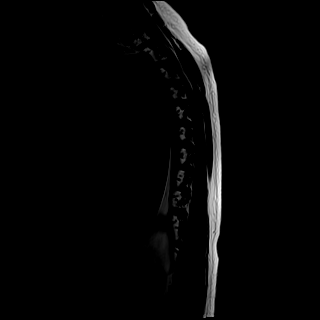

[Series 13: STIR · sagittal · 4.0mm · 1.06mm/px · 3 of 13 slices shown (2 of 2)]
[im 1/13]
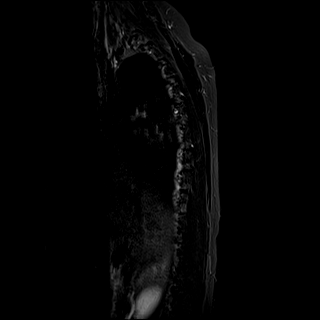
[im 7/13]
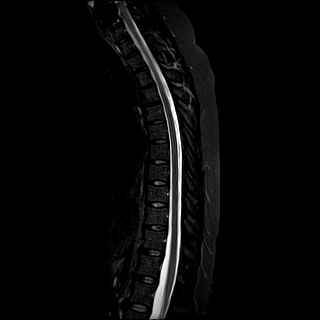
[im 13/13]
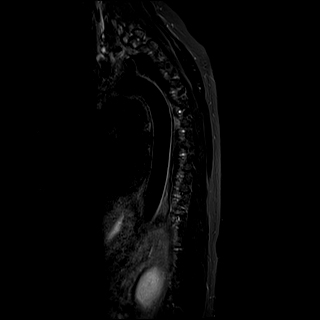

[Series 14: T2 · axial · 5.0mm · 0.86mm/px · z∈[-233,-27]mm · 8 of 39 slices shown (4 of 4)]
[im 1/39]
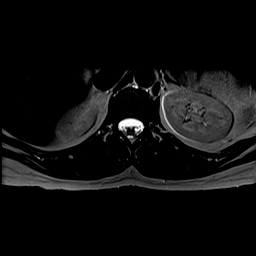
[im 6/39]
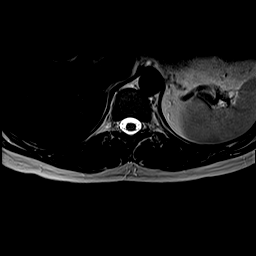
[im 11/39]
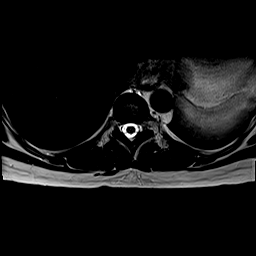
[im 17/39]
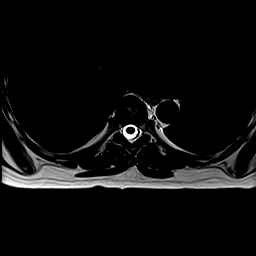
[im 22/39]
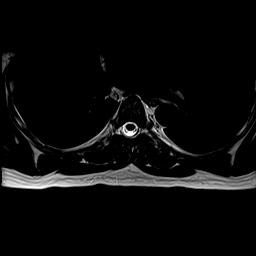
[im 28/39]
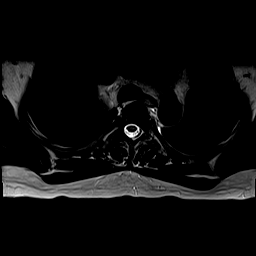
[im 33/39]
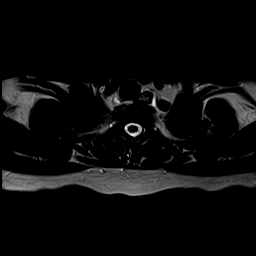
[im 39/39]
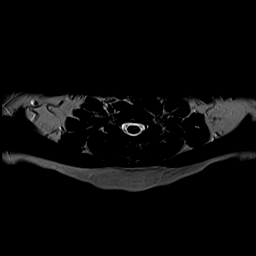

[39 of 48 positions shown; findings below may reference images not displayed]

FINDINGS: MRI CERVICAL SPINE FINDINGS

Alignment: Normal

Vertebrae: C5-C6 ACDF

Cord: Normal signal and morphology.

Posterior Fossa, vertebral arteries, paraspinal tissues: Negative.

Disc levels:

No disc herniation, spinal canal stenosis or neural foraminal
stenosis.

MRI THORACIC SPINE FINDINGS

Alignment:  Physiologic.

Vertebrae: No fracture, evidence of discitis, or bone lesion.

Cord:  Normal signal and morphology.

Paraspinal and other soft tissues: Negative.

Disc levels:

No herniation or stenosis.
IMPRESSION: Normal cervical and thoracic spine, status post C5-C6 ACDF.

## 2019-07-17 ENCOUNTER — Telehealth: Payer: Self-pay | Admitting: Internal Medicine

## 2019-07-17 NOTE — Telephone Encounter (Signed)
Pt has moved to [place] but comes to [place] to visit and do appts. Recall sent for [facility] for August 2020, mail was returned from her po box.

## 2020-07-01 ENCOUNTER — Encounter (HOSPITAL_BASED_OUTPATIENT_CLINIC_OR_DEPARTMENT_OTHER): Payer: Self-pay | Admitting: Orthopedic Surgery

## 2020-07-01 ENCOUNTER — Other Ambulatory Visit: Payer: Self-pay

## 2020-07-01 NOTE — Progress Notes (Signed)
Pre op call done. Pt states that surgery in on her right foot, left is posted. Called and spoke with Claiborne Billings at [facility] office and asked her to correct it.  Reviewed pt's echo and notes from Dr.Taylor with Dr. Lanetta Inch. Pt has not been seen since 2019. Will get BMET and EKG at PAT appt. Ok to proceed as planned.

## 2020-07-07 ENCOUNTER — Encounter (HOSPITAL_BASED_OUTPATIENT_CLINIC_OR_DEPARTMENT_OTHER)
Admission: RE | Admit: 2020-07-07 | Discharge: 2020-07-07 | Disposition: A | Discharge status: Home / Self Care | Payer: 59 | Source: Ambulatory Visit | Attending: Orthopedic Surgery | Admitting: Orthopedic Surgery

## 2020-07-07 ENCOUNTER — Other Ambulatory Visit (HOSPITAL_COMMUNITY)
Admission: RE | Admit: 2020-07-07 | Discharge: 2020-07-07 | Disposition: A | Discharge status: Home / Self Care | Payer: 59 | Source: Ambulatory Visit | Attending: Orthopedic Surgery | Admitting: Orthopedic Surgery

## 2020-07-07 DIAGNOSIS — M6701 Short Achilles tendon (acquired), right ankle: Secondary | ICD-10-CM | POA: Diagnosis not present

## 2020-07-07 DIAGNOSIS — T8484XA Pain due to internal orthopedic prosthetic devices, implants and grafts, initial encounter: Secondary | ICD-10-CM | POA: Diagnosis not present

## 2020-07-07 DIAGNOSIS — Z8249 Family history of ischemic heart disease and other diseases of the circulatory system: Secondary | ICD-10-CM | POA: Diagnosis not present

## 2020-07-07 DIAGNOSIS — M1990 Unspecified osteoarthritis, unspecified site: Secondary | ICD-10-CM | POA: Diagnosis not present

## 2020-07-07 DIAGNOSIS — Z881 Allergy status to other antibiotic agents status: Secondary | ICD-10-CM | POA: Diagnosis not present

## 2020-07-07 DIAGNOSIS — Z20822 Contact with and (suspected) exposure to covid-19: Secondary | ICD-10-CM | POA: Diagnosis not present

## 2020-07-07 DIAGNOSIS — Z882 Allergy status to sulfonamides status: Secondary | ICD-10-CM | POA: Diagnosis not present

## 2020-07-07 DIAGNOSIS — Z86711 Personal history of pulmonary embolism: Secondary | ICD-10-CM | POA: Diagnosis not present

## 2020-07-07 DIAGNOSIS — Y793 Surgical instruments, materials and orthopedic devices (including sutures) associated with adverse incidents: Secondary | ICD-10-CM | POA: Diagnosis not present

## 2020-07-07 DIAGNOSIS — Z79899 Other long term (current) drug therapy: Secondary | ICD-10-CM | POA: Diagnosis not present

## 2020-07-07 DIAGNOSIS — Z01812 Encounter for preprocedural laboratory examination: Secondary | ICD-10-CM | POA: Diagnosis present

## 2020-07-07 DIAGNOSIS — K219 Gastro-esophageal reflux disease without esophagitis: Secondary | ICD-10-CM | POA: Diagnosis not present

## 2020-07-07 DIAGNOSIS — K449 Diaphragmatic hernia without obstruction or gangrene: Secondary | ICD-10-CM | POA: Diagnosis not present

## 2020-07-07 DIAGNOSIS — I10 Essential (primary) hypertension: Secondary | ICD-10-CM | POA: Diagnosis not present

## 2020-07-07 DIAGNOSIS — J45909 Unspecified asthma, uncomplicated: Secondary | ICD-10-CM | POA: Diagnosis not present

## 2020-07-07 DIAGNOSIS — Z8261 Family history of arthritis: Secondary | ICD-10-CM | POA: Diagnosis not present

## 2020-07-07 DIAGNOSIS — F419 Anxiety disorder, unspecified: Secondary | ICD-10-CM | POA: Diagnosis not present

## 2020-07-07 DIAGNOSIS — K5090 Crohn's disease, unspecified, without complications: Secondary | ICD-10-CM | POA: Diagnosis not present

## 2020-07-07 LAB — BASIC METABOLIC PANEL
Anion gap: 9 (ref 5–15)
BUN: 13 mg/dL (ref 6–20)
CO2: 26 mmol/L (ref 22–32)
Calcium: 8.8 mg/dL — ABNORMAL LOW (ref 8.9–10.3)
Chloride: 105 mmol/L (ref 98–111)
Creatinine, Ser: 0.71 mg/dL (ref 0.44–1.00)
GFR calc Af Amer: >60 mL/min (ref >60)
GFR calc non Af Amer: >60 mL/min (ref >60)
Glucose, Bld: 113 mg/dL — ABNORMAL HIGH (ref 70–99)
Potassium: 3.8 mmol/L (ref 3.5–5.1)
Sodium: 140 mmol/L (ref 135–145)

## 2020-07-07 LAB — SARS CORONAVIRUS 2 (TAT 6-24 HRS): SARS Coronavirus 2: NEGATIVE

## 2020-07-07 LAB — POCT PREGNANCY, URINE: Preg Test, Ur: NEGATIVE

## 2020-07-07 NOTE — Progress Notes (Signed)

## 2020-07-07 NOTE — Anesthesia Preprocedure Evaluation (Addendum)
Anesthesia Evaluation  Patient identified by MRN, date of birth, ID band Patient awake    Reviewed: Allergy & Precautions, NPO status , Patient's Chart, lab work & pertinent test results  History of Anesthesia Complications (+) PONV and history of anesthetic complications  Airway Mallampati: II  TM Distance: >3 FB Neck ROM: Full    Dental no notable dental hx.    Pulmonary asthma ,    Pulmonary exam normal        Cardiovascular hypertension, Pt. on medications Normal cardiovascular exam     Neuro/Psych  Headaches, Anxiety Depression    GI/Hepatic Neg liver ROS, hiatal hernia, GERD  Medicated and Controlled,Crohn's dx   Endo/Other  negative endocrine ROS  Renal/GU negative Renal ROS  negative genitourinary   Musculoskeletal  (+) Arthritis ,   Abdominal   Peds  Hematology negative hematology ROS (+)   Anesthesia Other Findings Day of surgery medications reviewed with patient.  Reproductive/Obstetrics negative OB ROS                            Anesthesia Physical Anesthesia Plan  ASA: II  Anesthesia Plan: General   Post-op Pain Management: GA combined w/ Regional for post-op pain   Induction: Intravenous  PONV Risk Score and Plan: 4 or greater and Treatment may vary due to age or medical condition, Ondansetron, Dexamethasone, Midazolam and Scopolamine patch - Pre-op  Airway Management Planned: LMA  Additional Equipment: None  Intra-op Plan:   Post-operative Plan: Extubation in OR  Informed Consent: I have reviewed the patients History and Physical, chart, labs and discussed the procedure including the risks, benefits and alternatives for the proposed anesthesia with the patient or authorized representative who has indicated his/her understanding and acceptance.     Dental advisory given  Plan Discussed with: CRNA  Anesthesia Plan Comments:       Anesthesia Quick  Evaluation

## 2020-07-08 ENCOUNTER — Encounter (HOSPITAL_BASED_OUTPATIENT_CLINIC_OR_DEPARTMENT_OTHER)
Admission: RE | Disposition: A | Discharge status: Home / Self Care | Payer: Self-pay | Source: Home / Self Care | Attending: Orthopedic Surgery

## 2020-07-08 ENCOUNTER — Other Ambulatory Visit: Payer: Self-pay

## 2020-07-08 ENCOUNTER — Encounter (HOSPITAL_BASED_OUTPATIENT_CLINIC_OR_DEPARTMENT_OTHER): Payer: Self-pay | Admitting: Orthopedic Surgery

## 2020-07-08 ENCOUNTER — Ambulatory Visit (HOSPITAL_BASED_OUTPATIENT_CLINIC_OR_DEPARTMENT_OTHER): Payer: 59 | Admitting: Certified Registered"

## 2020-07-08 ENCOUNTER — Ambulatory Visit (HOSPITAL_BASED_OUTPATIENT_CLINIC_OR_DEPARTMENT_OTHER)
Admission: RE | Admit: 2020-07-08 | Discharge: 2020-07-08 | Disposition: A | Discharge status: Home / Self Care | Payer: 59 | Attending: Orthopedic Surgery | Admitting: Orthopedic Surgery

## 2020-07-08 DIAGNOSIS — Z86711 Personal history of pulmonary embolism: Secondary | ICD-10-CM | POA: Insufficient documentation

## 2020-07-08 DIAGNOSIS — K449 Diaphragmatic hernia without obstruction or gangrene: Secondary | ICD-10-CM | POA: Insufficient documentation

## 2020-07-08 DIAGNOSIS — K219 Gastro-esophageal reflux disease without esophagitis: Secondary | ICD-10-CM | POA: Insufficient documentation

## 2020-07-08 DIAGNOSIS — Z882 Allergy status to sulfonamides status: Secondary | ICD-10-CM | POA: Insufficient documentation

## 2020-07-08 DIAGNOSIS — M6701 Short Achilles tendon (acquired), right ankle: Secondary | ICD-10-CM | POA: Insufficient documentation

## 2020-07-08 DIAGNOSIS — Y793 Surgical instruments, materials and orthopedic devices (including sutures) associated with adverse incidents: Secondary | ICD-10-CM | POA: Insufficient documentation

## 2020-07-08 DIAGNOSIS — Z881 Allergy status to other antibiotic agents status: Secondary | ICD-10-CM | POA: Insufficient documentation

## 2020-07-08 DIAGNOSIS — F419 Anxiety disorder, unspecified: Secondary | ICD-10-CM | POA: Insufficient documentation

## 2020-07-08 DIAGNOSIS — Z8249 Family history of ischemic heart disease and other diseases of the circulatory system: Secondary | ICD-10-CM | POA: Insufficient documentation

## 2020-07-08 DIAGNOSIS — T8484XA Pain due to internal orthopedic prosthetic devices, implants and grafts, initial encounter: Secondary | ICD-10-CM | POA: Diagnosis not present

## 2020-07-08 DIAGNOSIS — J45909 Unspecified asthma, uncomplicated: Secondary | ICD-10-CM | POA: Insufficient documentation

## 2020-07-08 DIAGNOSIS — I10 Essential (primary) hypertension: Secondary | ICD-10-CM | POA: Insufficient documentation

## 2020-07-08 DIAGNOSIS — M1990 Unspecified osteoarthritis, unspecified site: Secondary | ICD-10-CM | POA: Insufficient documentation

## 2020-07-08 DIAGNOSIS — K5090 Crohn's disease, unspecified, without complications: Secondary | ICD-10-CM | POA: Insufficient documentation

## 2020-07-08 DIAGNOSIS — Z8261 Family history of arthritis: Secondary | ICD-10-CM | POA: Insufficient documentation

## 2020-07-08 DIAGNOSIS — Z79899 Other long term (current) drug therapy: Secondary | ICD-10-CM | POA: Insufficient documentation

## 2020-07-08 HISTORY — PX: HARDWARE REMOVAL: SHX979

## 2020-07-08 HISTORY — PX: GASTROCNEMIUS RECESSION: SHX863

## 2020-07-08 SURGERY — REMOVAL, HARDWARE
Anesthesia: General | Site: Foot | Laterality: Right

## 2020-07-08 MED ORDER — FENTANYL CITRATE (PF) 100 MCG/2ML IJ SOLN
INTRAMUSCULAR | Status: AC
  Filled 2020-07-08: qty 2

## 2020-07-08 MED ORDER — MIDAZOLAM HCL 2 MG/2ML IJ SOLN
INTRAMUSCULAR | Status: AC
  Filled 2020-07-08: qty 2

## 2020-07-08 MED ORDER — PROPOFOL 500 MG/50ML IV EMUL
INTRAVENOUS | Status: DC | PRN
  Administered 2020-07-08: 25 ug/kg/min via INTRAVENOUS

## 2020-07-08 MED ORDER — HYDROCODONE-ACETAMINOPHEN 5-325 MG PO TABS: 1.0000 | ORAL_TABLET | ORAL | 0 refills | Status: AC | PRN

## 2020-07-08 MED ORDER — CLONIDINE HCL (ANALGESIA) 100 MCG/ML EP SOLN
EPIDURAL | Status: DC | PRN
  Administered 2020-07-08: 33 ug
  Administered 2020-07-08: 67 ug

## 2020-07-08 MED ORDER — LIDOCAINE HCL (CARDIAC) PF 100 MG/5ML IV SOSY
PREFILLED_SYRINGE | INTRAVENOUS | Status: DC | PRN
  Administered 2020-07-08: 80 mg via INTRAVENOUS

## 2020-07-08 MED ORDER — METHOCARBAMOL 500 MG PO TABS: 500.0000 mg | ORAL_TABLET | Freq: Four times a day (QID) | ORAL | 1 refills | Status: DC | PRN

## 2020-07-08 MED ORDER — PROPOFOL 10 MG/ML IV BOLUS
INTRAVENOUS | Status: AC
  Filled 2020-07-08: qty 20

## 2020-07-08 MED ORDER — ONDANSETRON HCL 4 MG/2ML IJ SOLN
INTRAMUSCULAR | Status: DC | PRN
  Administered 2020-07-08: 4 mg via INTRAVENOUS

## 2020-07-08 MED ORDER — FENTANYL CITRATE (PF) 100 MCG/2ML IJ SOLN
100.0000 ug | Freq: Once | INTRAMUSCULAR | Status: AC
  Administered 2020-07-08: 50 ug via INTRAVENOUS

## 2020-07-08 MED ORDER — SCOPOLAMINE 1 MG/3DAYS TD PT72
1.0000 | MEDICATED_PATCH | TRANSDERMAL | Status: DC
  Administered 2020-07-08: 1.5 mg via TRANSDERMAL

## 2020-07-08 MED ORDER — SCOPOLAMINE 1 MG/3DAYS TD PT72
MEDICATED_PATCH | TRANSDERMAL | Status: AC
  Filled 2020-07-08: qty 1

## 2020-07-08 MED ORDER — BUPIVACAINE-EPINEPHRINE (PF) 0.5% -1:200000 IJ SOLN
INTRAMUSCULAR | Status: DC | PRN
  Administered 2020-07-08: 10 mL via PERINEURAL
  Administered 2020-07-08: 20 mL via PERINEURAL

## 2020-07-08 MED ORDER — PROPOFOL 10 MG/ML IV BOLUS
INTRAVENOUS | Status: DC | PRN
  Administered 2020-07-08: 150 mg via INTRAVENOUS

## 2020-07-08 MED ORDER — CEFAZOLIN SODIUM-DEXTROSE 2-4 GM/100ML-% IV SOLN
INTRAVENOUS | Status: AC
  Filled 2020-07-08: qty 100

## 2020-07-08 MED ORDER — MIDAZOLAM HCL 2 MG/2ML IJ SOLN
2.0000 mg | Freq: Once | INTRAMUSCULAR | Status: AC
  Administered 2020-07-08: 1 mg via INTRAVENOUS

## 2020-07-08 MED ORDER — DEXAMETHASONE SODIUM PHOSPHATE 10 MG/ML IJ SOLN
INTRAMUSCULAR | Status: DC | PRN
  Administered 2020-07-08: 4 mg via INTRAVENOUS

## 2020-07-08 MED ORDER — EPHEDRINE 5 MG/ML INJ
INTRAVENOUS | Status: AC
  Filled 2020-07-08: qty 10

## 2020-07-08 MED ORDER — LACTATED RINGERS IV SOLN: INTRAVENOUS | Status: DC

## 2020-07-08 MED ORDER — CEFAZOLIN SODIUM-DEXTROSE 2-4 GM/100ML-% IV SOLN
2.0000 g | INTRAVENOUS | Status: AC
  Administered 2020-07-08: 2 g via INTRAVENOUS

## 2020-07-08 MED ORDER — PREGABALIN 50 MG PO CAPS
50.0000 mg | ORAL_CAPSULE | Freq: Three times a day (TID) | ORAL | 0 refills | Status: DC
Start: 2020-07-08 — End: 2022-06-13

## 2020-07-08 MED ORDER — ACETAMINOPHEN 500 MG PO TABS
ORAL_TABLET | ORAL | Status: AC
  Filled 2020-07-08: qty 2

## 2020-07-08 MED ORDER — SODIUM CHLORIDE 0.9 % IV SOLN
INTRAVENOUS | Status: DC | PRN
  Administered 2020-07-08 (×5): 40 ug via INTRAVENOUS

## 2020-07-08 MED ORDER — PHENYLEPHRINE 40 MCG/ML (10ML) SYRINGE FOR IV PUSH (FOR BLOOD PRESSURE SUPPORT)
PREFILLED_SYRINGE | INTRAVENOUS | Status: AC
  Filled 2020-07-08: qty 10

## 2020-07-08 MED ORDER — ACETAMINOPHEN 500 MG PO TABS
1000.0000 mg | ORAL_TABLET | Freq: Once | ORAL | Status: AC
  Administered 2020-07-08: 1000 mg via ORAL

## 2020-07-08 SURGICAL SUPPLY — 74 items
APL PRP STRL LF DISP 70% ISPRP (MISCELLANEOUS) ×1
BLADE SURG 11 STRL SS (BLADE) ×2 IMPLANT
BLADE SURG 15 STRL LF DISP TIS (BLADE) ×1 IMPLANT
BLADE SURG 15 STRL SS (BLADE) ×3
BNDG CMPR 9X4 STRL LF SNTH (GAUZE/BANDAGES/DRESSINGS) ×1
BNDG COHESIVE 4X5 TAN STRL (GAUZE/BANDAGES/DRESSINGS) ×3 IMPLANT
BNDG ELASTIC 4X5.8 VLCR STR LF (GAUZE/BANDAGES/DRESSINGS) ×3 IMPLANT
BNDG ESMARK 4X9 LF (GAUZE/BANDAGES/DRESSINGS) ×3 IMPLANT
BOOT STEPPER DURA MED (SOFTGOODS) IMPLANT
BOOT STEPPER DURA SM (SOFTGOODS) ×2 IMPLANT
CHLORAPREP W/TINT 26 (MISCELLANEOUS) ×3 IMPLANT
CLOSURE STERI-STRIP 1/2X4 (GAUZE/BANDAGES/DRESSINGS) ×1
CLSR STERI-STRIP ANTIMIC 1/2X4 (GAUZE/BANDAGES/DRESSINGS) ×2 IMPLANT
COVER BACK TABLE 60X90IN (DRAPES) ×3 IMPLANT
COVER WAND RF STERILE (DRAPES) IMPLANT
CUFF TOURN SGL QUICK 24 (TOURNIQUET CUFF) ×3
CUFF TOURN SGL QUICK 34 (TOURNIQUET CUFF)
CUFF TRNQT CYL 24X4X16.5-23 (TOURNIQUET CUFF) IMPLANT
CUFF TRNQT CYL 34X4.125X (TOURNIQUET CUFF) IMPLANT
DECANTER SPIKE VIAL GLASS SM (MISCELLANEOUS) IMPLANT
DRAPE EXTREMITY T 121X128X90 (DISPOSABLE) ×3 IMPLANT
DRAPE IMP U-DRAPE 54X76 (DRAPES) ×3 IMPLANT
DRAPE INCISE IOBAN 66X45 STRL (DRAPES) ×3 IMPLANT
DRAPE OEC MINIVIEW 54X84 (DRAPES) ×3 IMPLANT
DRAPE SURG 17X23 STRL (DRAPES) IMPLANT
DRAPE U-SHAPE 47X51 STRL (DRAPES) ×2 IMPLANT
DRSG EMULSION OIL 3X3 NADH (GAUZE/BANDAGES/DRESSINGS) ×3 IMPLANT
DRSG MEPILEX BORDER 4X8 (GAUZE/BANDAGES/DRESSINGS) IMPLANT
ELECT REM PT RETURN 9FT ADLT (ELECTROSURGICAL) ×3
ELECTRODE REM PT RTRN 9FT ADLT (ELECTROSURGICAL) ×1 IMPLANT
GAUZE SPONGE 4X4 12PLY STRL (GAUZE/BANDAGES/DRESSINGS) ×3 IMPLANT
GAUZE XEROFORM 1X8 LF (GAUZE/BANDAGES/DRESSINGS) IMPLANT
GLOVE BIO SURGEON STRL SZ7.5 (GLOVE) ×6 IMPLANT
GLOVE BIOGEL PI IND STRL 8 (GLOVE) ×2 IMPLANT
GLOVE BIOGEL PI INDICATOR 8 (GLOVE) ×4
GOWN STRL REUS W/ TWL LRG LVL3 (GOWN DISPOSABLE) ×2 IMPLANT
GOWN STRL REUS W/ TWL XL LVL3 (GOWN DISPOSABLE) ×1 IMPLANT
GOWN STRL REUS W/TWL LRG LVL3 (GOWN DISPOSABLE) ×6
GOWN STRL REUS W/TWL XL LVL3 (GOWN DISPOSABLE) ×3
NDL HYPO 25X1 1.5 SAFETY (NEEDLE) ×1 IMPLANT
NEEDLE HYPO 25X1 1.5 SAFETY (NEEDLE) ×3 IMPLANT
NS IRRIG 1000ML POUR BTL (IV SOLUTION) ×3 IMPLANT
PACK BASIN DAY SURGERY FS (CUSTOM PROCEDURE TRAY) ×3 IMPLANT
PAD CAST 4YDX4 CTTN HI CHSV (CAST SUPPLIES) ×1 IMPLANT
PADDING CAST ABS 4INX4YD NS (CAST SUPPLIES) ×2
PADDING CAST ABS COTTON 4X4 ST (CAST SUPPLIES) ×1 IMPLANT
PADDING CAST COTTON 4X4 STRL (CAST SUPPLIES) ×3
PENCIL SMOKE EVACUATOR (MISCELLANEOUS) ×3 IMPLANT
SHEET MEDIUM DRAPE 40X70 STRL (DRAPES) IMPLANT
SLEEVE SCD COMPRESS KNEE MED (MISCELLANEOUS) ×2 IMPLANT
SPONGE LAP 4X18 RFD (DISPOSABLE) ×3 IMPLANT
STOCKINETTE 4X48 STRL (DRAPES) IMPLANT
STOCKINETTE 6  STRL (DRAPES) ×3
STOCKINETTE 6 STRL (DRAPES) ×1 IMPLANT
STOCKINETTE IMPERVIOUS LG (DRAPES) ×3 IMPLANT
SUCTION FRAZIER HANDLE 10FR (MISCELLANEOUS) ×3
SUCTION TUBE FRAZIER 10FR DISP (MISCELLANEOUS) IMPLANT
SUT ETHILON 3 0 PS 1 (SUTURE) IMPLANT
SUT MNCRL AB 3-0 PS2 18 (SUTURE) ×2 IMPLANT
SUT MNCRL AB 4-0 PS2 18 (SUTURE) ×2 IMPLANT
SUT MON AB 2-0 CT1 36 (SUTURE) IMPLANT
SUT MON AB 4-0 PC3 18 (SUTURE) IMPLANT
SUT PROLENE 3 0 PS 2 (SUTURE) IMPLANT
SUT VIC AB 0 CT1 27 (SUTURE)
SUT VIC AB 0 CT1 27XBRD ANBCTR (SUTURE) IMPLANT
SUT VIC AB 2-0 SH 27 (SUTURE) ×3
SUT VIC AB 2-0 SH 27XBRD (SUTURE) IMPLANT
SUT VIC AB 3-0 FS2 27 (SUTURE) IMPLANT
SYR BULB EAR ULCER 3OZ GRN STR (SYRINGE) ×3 IMPLANT
SYR CONTROL 10ML LL (SYRINGE) IMPLANT
TOWEL GREEN STERILE FF (TOWEL DISPOSABLE) ×3 IMPLANT
TUBE CONNECTING 20'X1/4 (TUBING) ×1
TUBE CONNECTING 20X1/4 (TUBING) ×1 IMPLANT
UNDERPAD 30X36 HEAVY ABSORB (UNDERPADS AND DIAPERS) ×3 IMPLANT

## 2020-07-08 NOTE — Anesthesia Procedure Notes (Signed)
Procedure Name: LMA Insertion Date/Time: 07/08/2020 1:02 PM Performed by: Signe Colt, CRNA Pre-anesthesia Checklist: Patient identified, Emergency Drugs available, Suction available and Patient being monitored Patient Re-evaluated:Patient Re-evaluated prior to induction Oxygen Delivery Method: Circle system utilized Preoxygenation: Pre-oxygenation with 100% oxygen Induction Type: IV induction Ventilation: Mask ventilation without difficulty LMA: LMA inserted LMA Size: 4.0 Number of attempts: 1 Airway Equipment and Method: Bite block Placement Confirmation: positive ETCO2 Tube secured with: Tape Dental Injury: Teeth and Oropharynx as per pre-operative assessment

## 2020-07-08 NOTE — Anesthesia Postprocedure Evaluation (Signed)
Anesthesia Post Note  Patient: [name]  Procedure(s) Performed: HARDWARE REMOVAL (Right Foot) GASTROCNEMIUS SLIDE (Right Foot)     Patient location during evaluation: PACU Anesthesia Type: General Level of consciousness: awake and alert and oriented Pain management: pain level controlled Vital Signs Assessment: post-procedure vital signs reviewed and stable Respiratory status: spontaneous breathing, nonlabored ventilation and respiratory function stable Cardiovascular status: blood pressure returned to baseline Postop Assessment: no apparent nausea or vomiting Anesthetic complications: no   No complications documented.  Last Vitals:  Vitals:   07/08/20 1417 07/08/20 1500  BP:  139/88  Pulse:    Resp:    Temp: 36.5 C   SpO2:      Last Pain:  Vitals:   07/08/20 1515  PainSc: 0-No pain                 Brennan Bailey

## 2020-07-08 NOTE — Progress Notes (Signed)
3 screws removed in [facility] OR by Dr. Percell Miller. Discarded per Dr. Percell Miller.

## 2020-07-08 NOTE — H&P (Signed)
ORTHOPAEDIC CONSULTATION  REQUESTING PHYSICIAN: [name], MD  Chief Complaint: painful hardwar and achillies tightness  HPI: [name] is a 44 y.o. female who complains of forefoot pain and painful HW  Past Medical History:  Diagnosis Date  . Anxiety   . Asthma   . Chest pain    a. s/p intermediate risk nuclear stress test on 09/23/14 and normal LHC on 09/24/14   . Chronic nausea   . Crohn disease ([place]) 2000  . Dysautonomia ([place])    recurrent syncope s/p ILR by Dr Doreatha Lew  . Endometriosis   . Essential hypertension 03/17/2017   a - Renal artery [name] 3/18: normal    . GERD (gastroesophageal reflux disease)   . H/O hiatal hernia   . Migraine   . Palpitations   . PONV (postoperative nausea and vomiting)   . Pulmonary embolism affecting pregnancy   . Vertigo    Past Surgical History:  Procedure Laterality Date  . [place] STUDY N/A 12/31/2016   Procedure: [place] STUDY;  Surgeon: Manus Gunning, MD;  Location: [facility];  Service: Gastroenterology;  Laterality: N/A;  . ANTERIOR CERVICAL DECOMP/DISCECTOMY FUSION N/A 07/19/2017   Procedure: Cervical five-six Anterior cervical decompression/discectomy/fusion;  Surgeon: Erline Levine, MD;  Location: [facility];  Service: Neurosurgery;  Laterality: N/A;  C5-6 Anterior cervical decompression/discectomy/fusion  . APPENDECTOMY  ~ 1992  . CARDIAC CATHETERIZATION    . CESAREAN SECTION  2011   emergency  CS due to placental abruption  . CESAREAN SECTION  2014   "preeclampsia"  . COLON SURGERY    . CYSTOSCOPY W/ STONE MANIPULATION  X 2  . ENDOSCOPIC RELEASE TRANSVERSE CARPAL LIGAMENT OF HAND Right 12/2010  . ESOPHAGEAL MANOMETRY N/A 12/31/2016   Procedure: ESOPHAGEAL MANOMETRY (EM);  Surgeon: Manus Gunning, MD;  Location: [facility];  Service: Gastroenterology;  Laterality: N/A;  . ILEOCECETOMY  2002 X 2  . KNEE ARTHROSCOPY Bilateral 1988-1990  . LAPAROSCOPY FOR ECTOPIC PREGNANCY  11/2012  . LEFT  HEART CATHETERIZATION WITH CORONARY ANGIOGRAM N/A 09/24/2014   Procedure: LEFT HEART CATHETERIZATION WITH CORONARY ANGIOGRAM;  Surgeon: Leonie Man, MD;  Location: [facility] CATH LAB;  Service: Cardiovascular;  Laterality: N/A;  . LOOP RECORDER IMPLANT  11/2009   by DR Doreatha Lew  . LOOP RECORDER REMOVAL N/A 07/05/2017   Procedure: Loop Recorder Removal;  Surgeon: Evans Lance, MD;  Location: [facility] CV LAB;  Service: Cardiovascular;  Laterality: N/A;  . NASAL SINUS SURGERY  ~ 2007  . OPEN REDUCTION INTERNAL FIXATION (ORIF) FOOT LISFRANC FRACTURE Right 07/18/2018   Procedure: OPEN REDUCTION INTERNAL FIXATION (ORIF) RIGHT TARSAL METATARSAL FRACTURE and DEPO INJECTION;  Surgeon: [name], MD;  Location: [facility];  Service: Orthopedics;  Laterality: Right;  . PARTIAL COLECTOMY  2002   partial removed due to crohns  . TONSILLECTOMY  ~ 1996  . US ECHOCARDIOGRAPHY  11/11/2009   EF 55-60%   Social History   Socioeconomic History  . Marital status: Married    Spouse name: Jonni Sanger  . Number of children: 2  . Years of education: college  . Highest education level: Not on file  Occupational History    Employer: OTHER    Comment: Airline pilot    Employer: [employer]  Tobacco Use  . Smoking status: Never Smoker  . Smokeless tobacco: Never Used  Vaping Use  . Vaping Use: Never used  Substance and Sexual Activity  .  Alcohol use: Yes    Alcohol/week: 1.0 standard drink    Types: 1 Glasses of wine per week    Comment: 1 glass per week  . Drug use: No  . Sexual activity: Yes    Birth control/protection: Surgical  Other Topics Concern  . Not on file  Social History Narrative   Patient lives at home with her husband Jonni Sanger). Patient works full time for [employer].    Education [employer].   Right handed.   Caffeine one cup of coffee daily.   Social Determinants of Health   Financial Resource Strain:   . Difficulty of Paying Living  Expenses:   Food Insecurity:   . Worried About Charity fundraiser in the Last Year:   . Arboriculturist in the Last Year:   Transportation Needs:   . Film/video editor (Medical):   Marland Kitchen Lack of Transportation (Non-Medical):   Physical Activity:   . Days of Exercise per Week:   . Minutes of Exercise per Session:   Stress:   . Feeling of Stress :   Social Connections:   . Frequency of Communication with Friends and Family:   . Frequency of Social Gatherings with Friends and Family:   . Attends Religious Services:   . Active Member of Clubs or Organizations:   . Attends Archivist Meetings:   Marland Kitchen Marital Status:    Family History  Problem Relation Age of Onset  . Hypertension Mother   . Hyperlipidemia Mother   . Arthritis Mother   . Diabetes Father   . Coronary artery disease Father   . Heart disease Father 61       MI age 32  . Hyperlipidemia Brother   . Hypertension Brother   . Colon cancer Paternal Grandmother    Allergies  Allergen Reactions  . Clarithromycin Other (See Comments)    Other reaction(s): Bleeding Cant take due to Chrons disease Other reaction(s): Bleeding Other reaction(s): Bleeding   . Metoclopramide Diarrhea and Nausea And Vomiting  . Remicade [Infliximab] Other (See Comments)    Serum Sickness  Couldn't walk, open mouth, pain,   . Sulfa Antibiotics Hives and Other (See Comments)    Hematemesis; documented while a young child    . Sulfamethoxazole Rash and Other (See Comments)    Internal bleeding and kidney infection  . Doxycycline Nausea And Vomiting  . Nitrofurantoin Nausea And Vomiting    Other reaction(s): GI Upset (intolerance)  . Promethazine Other (See Comments)    "muscle twitches"   . Ceftin [Cefuroxime Axetil] Other (See Comments)    dizziness  . Prochlorperazine Edisylate Nausea And Vomiting   Prior to Admission medications   Medication Sig Start Date End Date Taking? Authorizing Provider  ALPRAZolam (XANAX) 0.25  MG tablet TAKE 2 TABLETS BY MOUTH EVERY DAY AS NEEDED 09/17/17  Yes Bedsole, Amy E, MD  amLODipine-benazepril (LOTREL) 5-10 MG capsule Take 1 capsule by mouth daily.   Yes [provider]  butalbital-acetaminophen-caffeine (FIORICET, ESGIC) 50-325-40 MG tablet TAKE TWO CAPSULES BY MOUTH EVERY 6 HOURS AS NEEDED FOR MIGRAINES 09/17/17  Yes Bedsole, Amy E, MD  cetirizine (ZYRTEC) 10 MG tablet Take 1 tablet (10 mg total) by mouth at bedtime. 09/19/17  Yes Bedsole, Amy E, MD  cyanocobalamin (,VITAMIN B-12,) 1000 MCG/ML injection Inject 1 mL (1,000 mcg total) into the muscle every 14 (fourteen) days. 09/19/17  Yes Bedsole, Amy E, MD  dexlansoprazole (DEXILANT) 60 MG capsule Take 1 capsule (60  mg total) by mouth daily. 09/19/17  Yes Bedsole, Amy E, MD  DULoxetine (CYMBALTA) 30 MG capsule Take 30 mg by mouth daily.   Yes [provider]  montelukast (SINGULAIR) 10 MG tablet Take 1 tablet (10 mg total) by mouth at bedtime. 09/19/17  Yes Bedsole, Amy E, MD  pregabalin (LYRICA) 150 MG capsule Take 150 mg by mouth daily.   Yes [provider]  zolpidem (AMBIEN) 10 MG tablet Take 1 tablet (10 mg total) by mouth at bedtime. 09/17/17  Yes Bedsole, Amy E, MD   No results found.  Positive ROS: All other systems have been reviewed and were otherwise negative with the exception of those mentioned in the HPI and as above.  Labs cbc No results for input(s): WBC, HGB, HCT, PLT in the last 72 hours.  Labs inflam No results for input(s): CRP in the last 72 hours.  Invalid input(s): ESR  Labs coag No results for input(s): INR, PTT in the last 72 hours.  Invalid input(s): PT  Recent Labs    07/07/20 0800  NA 140  K 3.8  CL 105  CO2 26  GLUCOSE 113*  BUN 13  CREATININE 0.71  CALCIUM 8.8*    Physical Exam: Vitals:   07/08/20 1109 07/08/20 1130  BP:  120/85  Pulse:  82  Resp:  17  Temp: 97.8 F (36.6 C)   SpO2:  100%   General: Alert, no acute distress Cardiovascular: No  pedal edema Respiratory: No cyanosis, no use of accessory musculature GI: No organomegaly, abdomen is soft and non-tender Skin: No lesions in the area of chief complaint other than those listed below in MSK exam.  Neurologic: Sensation intact distally save for the below mentioned MSK exam Psychiatric: Patient is competent for consent with normal mood and affect Lymphatic: No axillary or cervical lymphadenopathy  MUSCULOSKELETAL:  RLE: painful HW, NVI, DF to neutral only Other extremities are atraumatic with painless ROM and NVI.  Assessment: Painful HW and gastroc tightness  Plan: HWR today with gastroc recession.    [name], MD    07/08/2020 12:48 PM

## 2020-07-08 NOTE — Anesthesia Procedure Notes (Signed)
Anesthesia Regional Block: Popliteal block   Pre-Anesthetic Checklist: ,, timeout performed, Correct Patient, Correct Site, Correct Laterality, Correct Procedure, Correct Position, site marked, Risks and benefits discussed, pre-op evaluation,  At surgeon's request and post-op pain management  Laterality: Right  Prep: Maximum Sterile Barrier Precautions used, chloraprep       Needles:  Injection technique: Single-shot  Needle Type: Echogenic Stimulator Needle     Needle Length: 9cm  Needle Gauge: 22     Additional Needles:   Procedures:,,,, ultrasound used (permanent image in chart),,,,  Narrative:  Start time: 07/08/2020 11:36 AM End time: 07/08/2020 11:38 AM Injection made incrementally with aspirations every 5 mL.  Performed by: Personally  Anesthesiologist: Brennan Bailey, MD  Additional Notes: Risks, benefits, and alternative discussed. Patient gave consent for procedure. Patient prepped and draped in sterile fashion. Sedation administered, patient remains easily responsive to voice. Relevant anatomy identified with ultrasound guidance. Local anesthetic given in 5cc increments with no signs or symptoms of intravascular injection. No pain or paraesthesias with injection. Patient monitored throughout procedure with signs of LAST or immediate complications. Tolerated well. Ultrasound image placed in chart.  Tawny Asal, MD

## 2020-07-08 NOTE — Progress Notes (Signed)
Assisted Dr. Daiva Huge with right, ultrasound guided, popliteal/saphenous, adductor canal block. Side rails up, monitors on throughout procedure. See vital signs in flow sheet. Tolerated Procedure well.

## 2020-07-08 NOTE — Transfer of Care (Signed)
Immediate Anesthesia Transfer of Care Note  Patient: [name]  Procedure(s) Performed: HARDWARE REMOVAL (Right Foot) GASTROCNEMIUS SLIDE (Right Foot)  Patient Location: PACU  Anesthesia Type:GA combined with regional for post-op pain  Level of Consciousness: awake, alert , oriented and patient cooperative  Airway & Oxygen Therapy: Patient Spontanous Breathing and Patient connected to face mask oxygen  Post-op Assessment: Report given to RN and Post -op Vital signs reviewed and stable  Post vital signs: Reviewed and stable  Last Vitals:  Vitals Value Taken Time  BP 133/85 07/08/20 1418  Temp    Pulse 90 07/08/20 1419  Resp    SpO2 100 % 07/08/20 1419  Vitals shown include unvalidated device data.  Last Pain:  Vitals:   07/08/20 1106  PainSc: 4       Patients Stated Pain Goal: 3 (37/62/83 1517)  Complications: No complications documented.

## 2020-07-08 NOTE — Discharge Instructions (Signed)
Diet: As you were doing prior to hospitalization   Shower:  May shower but keep the wounds dry.  If the bandage gets wet, change with a clean dry gauze.  If you have a splint on, leave the splint in place and keep the splint dry with a plastic bag.  Dressing:  Keep dressings on and dry until follow up.  Activity:  Increase activity slowly as tolerated, but follow the weight bearing instructions below.  The rules on driving is that you can not be taking narcotics while you drive, and you must feel in control of the vehicle.    Weight Bearing:   As tolerated with crutches in boot    To prevent constipation: you may use a stool softener such as -  Colace (over the counter) 100 mg by mouth twice a day  Drink plenty of fluids (prune juice may be helpful) and high fiber foods Miralax (over the counter) for constipation as needed.    Itching:  If you experience itching with your medications, try taking only a single pain pill, or even half a pain pill at a time.  You can also use benadryl over the counter for itching or also to help with sleep.   Precautions:  If you experience chest pain or shortness of breath - call 911 immediately for transfer to the hospital emergency department!!  If you develop a fever greater that 101 F, purulent drainage from wound, increased redness or drainage from wound, or calf pain -- Call the office at [phone]                                                 Follow- Up Appointment:  Please call for an appointment to be seen in 1-2 weeks [place] - [phone]   [place] Instructions  Activity: Get plenty of rest for the remainder of the day. A responsible individual must stay with you for 24 hours following the procedure.  For the next 24 hours, DO NOT: -Drive a car -Paediatric nurse -Drink alcoholic beverages -Take any medication unless instructed by your physician -Make any legal decisions or sign important  papers.  Meals: Start with liquid foods such as gelatin or soup. Progress to regular foods as tolerated. Avoid greasy, spicy, heavy foods. If nausea and/or vomiting occur, drink only clear liquids until the nausea and/or vomiting subsides. Call your physician if vomiting continues.  Special Instructions/Symptoms: Your throat may feel dry or sore from the anesthesia or the breathing tube placed in your throat during surgery. If this causes discomfort, gargle with warm salt water. The discomfort should disappear within 24 hours.  If you had a scopolamine patch placed behind your ear for the management of post- operative nausea and/or vomiting:  1. The medication in the patch is effective for 72 hours, after which it should be removed.  Wrap patch in a tissue and discard in the trash. Wash hands thoroughly with soap and water. 2. You may remove the patch earlier than 72 hours if you experience unpleasant side effects which may include dry mouth, dizziness or visual disturbances. 3. Avoid touching the patch. Wash your hands with soap and water after contact with the patch.    Regional Anesthesia Blocks  1. Numbness or the inability to move the "blocked" extremity may last from 3-48 hours after placement. The  length of time depends on the medication injected and your individual response to the medication. If the numbness is not going away after 48 hours, call your surgeon.  2. The extremity that is blocked will need to be protected until the numbness is gone and the  Strength has returned. Because you cannot feel it, you will need to take extra care to avoid injury. Because it may be weak, you may have difficulty moving it or using it. You may not know what position it is in without looking at it while the block is in effect.  3. For blocks in the legs and feet, returning to weight bearing and walking needs to be done carefully. You will need to wait until the numbness is entirely gone and the  strength has returned. You should be able to move your leg and foot normally before you try and bear weight or walk. You will need someone to be with you when you first try to ensure you do not fall and possibly risk injury.  4. Bruising and tenderness at the needle site are common side effects and will resolve in a few days.  5. Persistent numbness or new problems with movement should be communicated to the surgeon or the [facility] [phone] [place] [phone]).

## 2020-07-08 NOTE — Anesthesia Procedure Notes (Signed)
Anesthesia Regional Block: Adductor canal block   Pre-Anesthetic Checklist: ,, timeout performed, Correct Patient, Correct Site, Correct Laterality, Correct Procedure, Correct Position, site marked, Risks and benefits discussed, pre-op evaluation,  At surgeon's request and post-op pain management  Laterality: Right  Prep: Maximum Sterile Barrier Precautions used, chloraprep       Needles:  Injection technique: Single-shot  Needle Type: Echogenic Stimulator Needle     Needle Length: 9cm  Needle Gauge: 22     Additional Needles:   Procedures:,,,, ultrasound used (permanent image in chart),,,,  Narrative:  Start time: 07/08/2020 11:33 AM End time: 07/08/2020 11:35 AM Injection made incrementally with aspirations every 5 mL.  Performed by: Personally  Anesthesiologist: Brennan Bailey, MD  Additional Notes: Risks, benefits, and alternative discussed. Patient gave consent for procedure. Patient prepped and draped in sterile fashion. Sedation administered, patient remains easily responsive to voice. Relevant anatomy identified with ultrasound guidance. Local anesthetic given in 5cc increments with no signs or symptoms of intravascular injection. No pain or paraesthesias with injection. Patient monitored throughout procedure with signs of LAST or immediate complications. Tolerated well. Ultrasound image placed in chart.  Tawny Asal, MD

## 2020-07-11 ENCOUNTER — Encounter (HOSPITAL_BASED_OUTPATIENT_CLINIC_OR_DEPARTMENT_OTHER): Payer: Self-pay | Admitting: Orthopedic Surgery

## 2020-07-12 NOTE — Op Note (Signed)
07/08/2020  9:57 AM  PATIENT:  [name]    PRE-OPERATIVE DIAGNOSIS:  PAINFUL HARDWARE RIGHT  FOOT  POST-OPERATIVE DIAGNOSIS:  Same  PROCEDURE:  HARDWARE REMOVAL, GASTROCNEMIUS SLIDE  SURGEON:  [name], MD  ASSISTANT: Nehemiah Massed PA-c, Meridee Score MD  ANESTHESIA:   gen  PREOPERATIVE INDICATIONS:  [name] is a  44 y.o. female with a diagnosis of PAINFUL HARDWARE RIGHT  FOOT who failed conservative measures and elected for surgical management.    The risks benefits and alternatives were discussed with the patient preoperatively including but not limited to the risks of infection, bleeding, nerve injury, cardiopulmonary complications, the need for revision surgery, among others, and the patient was willing to proceed.  OPERATIVE IMPLANTS: none  OPERATIVE FINDINGS: Gastroc contracture  BLOOD LOSS: min  COMPLICATIONS: none  TOURNIQUET TIME: none  OPERATIVE PROCEDURE:  Patient was identified in the preoperative holding area and site was marked by me She was transported to the operating theater and placed on the table in supine position taking care to pad all bony prominences. After a preincinduction time out anesthesia was induced. The Right lower extremity was prepped and draped in normal sterile fashion and a pre-incision timeout was performed. She received ancef for preoperative antibiotics.   At the right foot incision was made using fluoroscopic guidance over her screws. These were were able to be removed intact.  Thorough irrigation was performed and these incisions were closed. Neurovascular structures were protected throughout.  Next attention was turned to the gastroc recession longitudinal incision of 2 cm was made roughly 15 cm proximal to her medial medial malleolus. Fascia over the gastroc was identified and incised transversely muscle was left intact. Sural nerve was protected on the lateral side.  Attention was then turned to the internal fascia  within the gastroc this was also released  Prior to recession she was able to dorsiflex to neutral with the knee extended afterwards she was able to dorsiflex roughly 10 to 15 degrees.  This incision was then thoroughly irrigated and closed in layers sterile dressing was applied  POST OPERATIVE PLAN: Partial weightbearing mobilize and aspirin for DVT prophylaxis

## 2022-01-22 ENCOUNTER — Telehealth: Payer: Self-pay | Admitting: *Deleted

## 2022-01-22 NOTE — Telephone Encounter (Signed)
Mrs. Jacot reached out to me over the weekend.  She currently is living in [place] and is having multiple health issues.  She currently has a prolapsed bladder, rectum and vagina.  With [place] [facility] everything is done in phases and Sammie is ready to fly back to the [name] to have these issues taken care of.  She is requesting a referral to [place] Pink at [facility] who is a URO/GYN specialist.  The only issue is all her medical records/disc are with her in [place] and are also written in [place], which she will bring with her when she flies back to the states.  She is hoping to be able to get maybe a virtual consult set up with Dr. Zigmund Daniel first and get everything lined up before flying back in order to be away from her family for less time as possible.  She has Crohns and issues with internal hemorrhoids as well.  Sammie's email is [email or web address].  She states you can use Facetime [employer] for free to call her in [place].  Phone number is [phone].

## 2022-01-23 ENCOUNTER — Other Ambulatory Visit: Payer: Self-pay | Admitting: Family Medicine

## 2022-01-23 DIAGNOSIS — N8110 Cystocele, unspecified: Secondary | ICD-10-CM

## 2022-01-23 DIAGNOSIS — N816 Rectocele: Secondary | ICD-10-CM

## 2022-01-23 NOTE — Progress Notes (Signed)
Referral placed. I am not sure whether virtual initial consult with  [name] at [facility]  is possible, but please look into it.

## 2022-01-23 NOTE — Telephone Encounter (Signed)
Referral placed.. we will have to see what Dr. Zigmund Daniel office is comfortable with.

## 2022-01-26 ENCOUNTER — Encounter: Payer: Self-pay | Admitting: *Deleted

## 2022-01-26 NOTE — Telephone Encounter (Signed)
Mychart sent to patient  Referral faxed

## 2022-02-15 ENCOUNTER — Encounter: Payer: Self-pay | Admitting: Family Medicine

## 2022-05-24 ENCOUNTER — Encounter: Payer: Self-pay | Admitting: Family Medicine

## 2022-05-26 ENCOUNTER — Encounter: Payer: Self-pay | Admitting: Family Medicine

## 2022-06-05 ENCOUNTER — Encounter: Payer: Self-pay | Admitting: Gastroenterology

## 2022-06-13 ENCOUNTER — Ambulatory Visit (INDEPENDENT_AMBULATORY_CARE_PROVIDER_SITE_OTHER): Payer: 59 | Admitting: Gastroenterology

## 2022-06-13 ENCOUNTER — Other Ambulatory Visit (INDEPENDENT_AMBULATORY_CARE_PROVIDER_SITE_OTHER): Payer: 59

## 2022-06-13 ENCOUNTER — Ambulatory Visit: Payer: 59 | Admitting: Gastroenterology

## 2022-06-13 ENCOUNTER — Encounter: Payer: Self-pay | Admitting: Gastroenterology

## 2022-06-13 ENCOUNTER — Telehealth: Payer: Self-pay

## 2022-06-13 ENCOUNTER — Ambulatory Visit (INDEPENDENT_AMBULATORY_CARE_PROVIDER_SITE_OTHER)
Admission: RE | Admit: 2022-06-13 | Discharge: 2022-06-13 | Disposition: A | Discharge status: Home / Self Care | Payer: 59 | Source: Ambulatory Visit | Attending: Gastroenterology | Admitting: Gastroenterology

## 2022-06-13 VITALS — BP 100/74 | HR 87 | Ht 64.0 in | Wt 151.0 lb

## 2022-06-13 DIAGNOSIS — K50918 Crohn's disease, unspecified, with other complication: Secondary | ICD-10-CM

## 2022-06-13 DIAGNOSIS — K623 Rectal prolapse: Secondary | ICD-10-CM

## 2022-06-13 DIAGNOSIS — K8050 Calculus of bile duct without cholangitis or cholecystitis without obstruction: Secondary | ICD-10-CM

## 2022-06-13 DIAGNOSIS — K5900 Constipation, unspecified: Secondary | ICD-10-CM

## 2022-06-13 DIAGNOSIS — R109 Unspecified abdominal pain: Secondary | ICD-10-CM

## 2022-06-13 DIAGNOSIS — K649 Unspecified hemorrhoids: Secondary | ICD-10-CM | POA: Diagnosis not present

## 2022-06-13 DIAGNOSIS — K625 Hemorrhage of anus and rectum: Secondary | ICD-10-CM

## 2022-06-13 DIAGNOSIS — D500 Iron deficiency anemia secondary to blood loss (chronic): Secondary | ICD-10-CM

## 2022-06-13 LAB — CBC WITH DIFFERENTIAL/PLATELET
Basophils Absolute: 0.1 10*3/uL (ref 0.0–0.1)
Basophils Relative: 0.8 % (ref 0.0–3.0)
Eosinophils Absolute: 0.1 10*3/uL (ref 0.0–0.7)
Eosinophils Relative: 1.6 % (ref 0.0–5.0)
HCT: 29.6 % — ABNORMAL LOW (ref 36.0–46.0)
Hemoglobin: 9.5 g/dL — ABNORMAL LOW (ref 12.0–15.0)
Lymphocytes Relative: 34.8 % (ref 12.0–46.0)
Lymphs Abs: 2.5 10*3/uL (ref 0.7–4.0)
MCHC: 32.2 g/dL (ref 30.0–36.0)
MCV: 80.9 fl (ref 78.0–100.0)
Monocytes Absolute: 0.5 10*3/uL (ref 0.1–1.0)
Monocytes Relative: 6.3 % (ref 3.0–12.0)
Neutro Abs: 4.1 10*3/uL (ref 1.4–7.7)
Neutrophils Relative %: 56.5 % (ref 43.0–77.0)
Platelets: 401 10*3/uL — ABNORMAL HIGH (ref 150.0–400.0)
RBC: 3.66 Mil/uL — ABNORMAL LOW (ref 3.87–5.11)
RDW: 15.1 % (ref 11.5–15.5)
WBC: 7.3 10*3/uL (ref 4.0–10.5)

## 2022-06-13 LAB — TSH: TSH: 0.95 u[IU]/mL (ref 0.35–5.50)

## 2022-06-13 LAB — COMPREHENSIVE METABOLIC PANEL
ALT: 15 U/L (ref 0–35)
AST: 16 U/L (ref 0–37)
Albumin: 4.3 g/dL (ref 3.5–5.2)
Alkaline Phosphatase: 85 U/L (ref 39–117)
BUN: 11 mg/dL (ref 6–23)
CO2: 29 mEq/L (ref 19–32)
Calcium: 9.2 mg/dL (ref 8.4–10.5)
Chloride: 105 mEq/L (ref 96–112)
Creatinine, Ser: 0.76 mg/dL (ref 0.40–1.20)
GFR: 93.97 mL/min (ref >60.00)
Glucose, Bld: 99 mg/dL (ref 70–99)
Potassium: 4.3 mEq/L (ref 3.5–5.1)
Sodium: 139 mEq/L (ref 135–145)
Total Bilirubin: 0.3 mg/dL (ref 0.2–1.2)
Total Protein: 7 g/dL (ref 6.0–8.3)

## 2022-06-13 LAB — C-REACTIVE PROTEIN: CRP: <1 mg/dL (ref 0.5–20.0)

## 2022-06-13 LAB — IBC + FERRITIN
Ferritin: 6.8 ng/mL — ABNORMAL LOW (ref 10.0–291.0)
Iron: 42 ug/dL (ref 42–145)
Saturation Ratios: 7.6 % — ABNORMAL LOW (ref 20.0–50.0)
TIBC: 550.2 ug/dL — ABNORMAL HIGH (ref 250.0–450.0)
Transferrin: 393 mg/dL — ABNORMAL HIGH (ref 212.0–360.0)

## 2022-06-13 MED ORDER — LINACLOTIDE 290 MCG PO CAPS: 290.0000 ug | ORAL_CAPSULE | Freq: Every day | ORAL | 0 refills | Status: DC

## 2022-06-13 MED ORDER — ONDANSETRON 4 MG PO TBDP: 4.0000 mg | ORAL_TABLET | Freq: Four times a day (QID) | ORAL | 1 refills | Status: DC | PRN

## 2022-06-13 NOTE — Progress Notes (Signed)
HPI :  CROHNS HISTORY 46 y/o female with ileocolonic stricturing / perforating Crohn's disease   Ileocolonic Crohns - diagnosed in 2002. She reported she presented with a bowel obstruction with perforation and had surgery upon diagnosis and had 2 surgeries within the first year of diagnosis. She reports a history of abscess formation and fistula development (enteroenteral fistulas?). She was eventually placed on Remicade, only for a short period of time, perhaps 1-2 infusions - she was uncertain if it worked or not, but stopped it for unclear reasons. She had been on 6MP or steroids over time otherwise, she states she has intermittently been on 6MP. She has been on mesalamines in the past which did not help. She has been treated for bacterial overgrowth over time given ileocecectomy. She has had 2 surgeries total for her Crohns. She has been previously treated with Humira which appeared to put her disease into remission, however developed severe arthralgias, evaluated by Rheumatology who thought it was related to Humira, which was stopped and her symptoms resolved. She was then given a dose of Remicade and developed a serum sickness which led to hospital admission, treated with steroids.    She otherwise has a history of uveitis and iritis with Cronhns flares. She is seeing opthalmology in [facility] for this. She reports she also has significant back pain and arthralgias due to Crohns flares. She takes tramadol for pain, does not NSAIDs.   Her pregnancy was complicated by PE x 2. Was on coumadin but now off all anticoagulants. She has been on IV iron in the past.  She has been on Entyvio since June 2017.    SINCE THE LAST VISIT:   46 year old female here to reestablish care for multiple issues.  I have not seen her since February 2018.  She moved to [place] with her husband who is principal of the school at an [employer] in [place].  She has been receiving her care there since that time.  She has  remained on Entyvio infusions every 6 weeks since then.  She was previously on MRN when I saw her in the past but she is no longer on that.  She has had multiple problems with her GI tract since have last seen her.  We spent several minutes today discussing her case and reviewing her records to catch up on her medical history.  Essentially for the past few years she is done quite poorly.  She has recurrent right upper quadrant pain that radiates to her back and shoulder after she eats.  She has not been eating well due to this and has fluctuations in how much the symptoms bother her.  She has been told she has gallstones and sludge in her gallbladder based on imaging there but they would not do surgery to remove her gallbladder because she has a history of Crohn's disease.  She is been placed on ursodiol for the past 3 years and she states this has not really been helping of her symptoms which continue to bother her.  Sometimes she will have nausea and vomiting with this.  Separately she has had episodes of severe bowel dysfunction.  She can often have normal bowel movements and go every day for a period of time and then will have no bowel movements for days upwards of a week or 2 at a time.  When this happens she states she gets severe pain and discomfort in her abdomen.  She has been hospitalized 3 or 4 times since  2020 with severe abdominal pain due to distended colon and she states concern for perforation at the time.  She is treated with multiple laxatives to move her bowel.  She definitely feels better when she is moving her bowels more regularly, although it does not alleviate any of her right upper quadrant pain.  She still has some pain in her mid abdomen and lower abdomen at times.  More recently she has not had bowel movement for about the past week.  She has passing gas.  She feels distended and quite uncomfortable.  I reviewed her regimen with her which includes Motegrity 2 mg daily, MiraLAX as  needed, she takes multiple doses per day, also taking senna supplements.  She is taking Prevacid for reflux which works pretty well for her.  She has frequent rectal bleeding due to internal hemorrhoids.  She also complains of bladder and rectal prolapse.  She has had significant bleeding in the past and this has been going on for some time now.  She states she had an extensive work-up with the [place] physicians to include endoscopy, colonoscopy, multiple scans.  She last had a colonoscopy in October 2022 stating her Crohn's was in remission, no mucosal inflammation, no stricture at the surgical anastomosis.  She has been told she has dysmotility of her right colon, where stool becomes stationary there and is the cause of her problems.  She was seen by a surgeon and told she might need a colectomy.  She has deferred any surgeries while in [place].  She is tearful in the office today, generally has been feeling poorly for a few years now and she reports they state her Crohn's is well controlled and has continued her on the [place].  Her last dose of this was in mid May or so.  She flew to the [place] and will be here for 6 weeks for further evaluation of these issues and hopefully to get feeling better before she returns to [place].  She has used bowel preps in the past but she eats nothing but "bloody water" comes out and often does not help produce much stool.  She has used enemas in the past which does not help her go, only produces more blood.  She denies any NSAID use.  She has not tried any other bowel regimens lately other than what was previously discussed.  She last had a CT scan in January of this year.  She brings discs of images with her today as well as that of a deaf echography study, I cannot visualize those on the computer today.  For her prolapse issue she is never seen pelvic floor physical therapy.  It does not sound like she has tried any antispasmodics such as Bentyl or Levsin  etc.   Prior workup here: EGD 12/14/16 - 1cm hiatal hernia, otherwise normal exam, benign stomach polyps fundic gland Colonoscopy 12/14/16 - normal colon and ileum, few small ulcers at anastomosis - normal colon bx Esophageal manometry 12/2016 - normal 24 HR pH impedance testing - Demeester score of 4.1, 41 symptoms reported, symptom index of 12% for reflux    Colonoscopy 09/2021 - [place] - reported Crohn's in remission - widely patent anastomosis and normal bowel?  MRE enterography 10/28/21 - [place] no dilated loops of small bowel or pathologic abnormalities   Past Medical History:  Diagnosis Date   Anxiety    Asthma    Chest pain    a. s/p intermediate risk nuclear stress test on 09/23/14  and normal LHC on 09/24/14    Chronic nausea    Colonic inertia    Crohn disease ([place]) 2000   Diverticulitis    Dysautonomia ([place])    recurrent syncope s/p ILR by Dr Doreatha Lew   Endometriosis    Essential hypertension 03/17/2017   a - Renal artery [name] 3/18: normal     GERD (gastroesophageal reflux disease)    H/O hiatal hernia    Migraine    Palpitations    PONV (postoperative nausea and vomiting)    Pulmonary embolism affecting pregnancy    Vertigo      Past Surgical History:  Procedure Laterality Date   18 HOUR [place] STUDY N/A 12/31/2016   Procedure: 24 HOUR [place] STUDY;  Surgeon: Manus Gunning, MD;  Location: [facility];  Service: Gastroenterology;  Laterality: N/A;   ANTERIOR CERVICAL DECOMP/DISCECTOMY FUSION N/A 07/19/2017   Procedure: Cervical five-six Anterior cervical decompression/discectomy/fusion;  Surgeon: Erline Levine, MD;  Location: [facility];  Service: Neurosurgery;  Laterality: N/A;  C5-6 Anterior cervical decompression/discectomy/fusion   APPENDECTOMY  ~ [place]  2011   emergency  CS due to placental abruption   CESAREAN SECTION  2014   "preeclampsia"   COLON SURGERY     CYSTOSCOPY W/ STONE MANIPULATION  X 2   ENDOSCOPIC  RELEASE TRANSVERSE CARPAL LIGAMENT OF HAND Right 12/2010   ESOPHAGEAL MANOMETRY N/A 12/31/2016   Procedure: ESOPHAGEAL MANOMETRY (EM);  Surgeon: Manus Gunning, MD;  Location: [facility];  Service: Gastroenterology;  Laterality: N/A;   GASTROCNEMIUS RECESSION Right 07/08/2020   Procedure: GASTROCNEMIUS SLIDE;  Surgeon: [name], MD;  Location: [facility];  Service: Orthopedics;  Laterality: Right;   HARDWARE REMOVAL Right 07/08/2020   Procedure: HARDWARE REMOVAL;  Surgeon: [name], MD;  Location: [facility];  Service: Orthopedics;  Laterality: Right;   ILEOCECETOMY  2002 X 2   KNEE ARTHROSCOPY Bilateral 1988-1990   LAPAROSCOPY FOR ECTOPIC PREGNANCY  11/2012   LEFT HEART CATHETERIZATION WITH CORONARY ANGIOGRAM N/A 09/24/2014   Procedure: LEFT HEART CATHETERIZATION WITH CORONARY ANGIOGRAM;  Surgeon: Leonie Man, MD;  Location: [facility] LAB;  Service: Cardiovascular;  Laterality: N/A;   LOOP RECORDER IMPLANT  11/2009   by DR Doreatha Lew   LOOP RECORDER REMOVAL N/A 07/05/2017   Procedure: Loop Recorder Removal;  Surgeon: Evans Lance, MD;  Location: [facility];  Service: Cardiovascular;  Laterality: N/A;   NASAL SINUS SURGERY  ~ 2007   OPEN REDUCTION INTERNAL FIXATION (ORIF) FOOT LISFRANC FRACTURE Right 07/18/2018   Procedure: OPEN REDUCTION INTERNAL FIXATION (ORIF) RIGHT TARSAL METATARSAL FRACTURE and DEPO INJECTION;  Surgeon: [name], MD;  Location: [facility];  Service: Orthopedics;  Laterality: Right;   PARTIAL COLECTOMY  2002   partial removed due to crohns   TONSILLECTOMY  ~ 1996   US ECHOCARDIOGRAPHY  11/11/2009   EF 55-60%   Family History  Problem Relation Age of Onset   Hypertension Mother    Hyperlipidemia Mother    Arthritis Mother    Diabetes Father    Coronary artery disease Father    Heart disease Father 26       MI age 56   Hyperlipidemia Brother    Hypertension Brother    Colon  cancer Paternal Grandmother    Social History   Tobacco Use   Smoking status: Never   Smokeless tobacco: Never  Vaping Use  Vaping Use: Never used  Substance Use Topics   Alcohol use: Yes    Alcohol/week: 1.0 standard drink of alcohol    Types: 1 Glasses of wine per week    Comment: 1 glass per week   Drug use: No   Current Outpatient Medications  Medication Sig Dispense Refill   ALPRAZolam (XANAX) 0.25 MG tablet TAKE 2 TABLETS BY MOUTH EVERY DAY AS NEEDED 180 tablet 1   amLODipine-benazepril (LOTREL) 5-10 MG capsule Take 1 capsule by mouth daily.     butalbital-acetaminophen-caffeine (FIORICET, ESGIC) 50-325-40 MG tablet TAKE TWO CAPSULES BY MOUTH EVERY 6 HOURS AS NEEDED FOR MIGRAINES 60 tablet 1   cetirizine (ZYRTEC) 10 MG tablet Take 1 tablet (10 mg total) by mouth at bedtime. 90 tablet 3   DULoxetine (CYMBALTA) 30 MG capsule Take 30 mg by mouth daily.     linaclotide (LINZESS) 290 MCG CAPS capsule Take 1 capsule (290 mcg total) by mouth daily before breakfast. 30 capsule 0   montelukast (SINGULAIR) 10 MG tablet Take 1 tablet (10 mg total) by mouth at bedtime. 90 tablet 3   ondansetron (ZOFRAN-ODT) 4 MG disintegrating tablet Take 1 tablet (4 mg total) by mouth every 6 (six) hours as needed for nausea or vomiting. 30 tablet 1   zolpidem (AMBIEN) 10 MG tablet Take 1 tablet (10 mg total) by mouth at bedtime. 90 tablet 1   No current facility-administered medications for this visit.   Allergies  Allergen Reactions   Clarithromycin Other (See Comments)    Other reaction(s): Bleeding Cant take due to Chrons disease Other reaction(s): Bleeding Other reaction(s): Bleeding    Metoclopramide Diarrhea and Nausea And Vomiting   Remicade [Infliximab] Other (See Comments)    Serum Sickness  Couldn't walk, open mouth, pain,    Sulfa Antibiotics Hives and Other (See Comments)    Hematemesis; documented while a young child     Sulfamethoxazole Rash and Other (See Comments)     Internal bleeding and kidney infection   Doxycycline Nausea And Vomiting   Nitrofurantoin Nausea And Vomiting    Other reaction(s): GI Upset (intolerance)   Promethazine Other (See Comments)    "muscle twitches"    Ceftin [Cefuroxime Axetil] Other (See Comments)    dizziness   Prochlorperazine Edisylate Nausea And Vomiting     Review of Systems: All systems reviewed and negative except where noted in HPI.    No results found.  Physical Exam: BP 100/74   Pulse 87   Ht 5' 4"  (1.626 m)   Wt 151 lb (68.5 kg)   BMI 25.92 kg/m  Constitutional: Pleasant,well-developed, female  HEENT: Normocephalic and atraumatic. Conjunctivae are normal. No scleral icterus. Neck supple.  Cardiovascular: Normal rate, regular rhythm.  Pulmonary/chest: Effort normal and breath sounds normal. No wheezing, rales or rhonchi. Abdominal: Soft, mildly distended, tenderness to palpation in RUQ and mid abdomen. There are no masses palpable.  DRE / [place] standby - no fissure, inflamed internal hemorrhoids, no mass lesions.  Normal resting tone, normal descent, no overt evidence of dyssynergy. Extremities: no edema Lymphadenopathy: No cervical adenopathy noted. Neurological: Alert and oriented to person place and time. Skin: Skin is warm and dry. No rashes noted. Psychiatric: Normal mood and affect. Behavior is normal.   ASSESSMENT AND PLAN: 46 year old female here to reestablish care for the following:  Crohn's disease Constipation Rectal bleeding Internal hemorrhoids Abdominal pain Biliary colic Rectal prolapse  History as above, history of significant Crohn's disease maintained on Entyvio.  She has been on this regimen and and reportedly Crohn's has been under good control based off prior colonoscopy and radiology studies, however she has ongoing abdominal pain, and episodic constipation which is severe that has led to hospitalization in the past with concern for possible  obstruction?  History taken from the patient and extensive chart review although a lot of the reports are written in [place] in which I cannot interpret.  The question at present time is if Crohn's disease is causing her symptoms or not, or strictures related to that.  Based on the work-up she has had that does not appear to be the case but need to further evaluate to clarify what is driving this.  She has been told she has severe dysmotility or perhaps some adhesive disease in the right abdomen causing her bowel dysfunction.  She has imaging on CDs, I will try to upload these to the radiology server and see if I can review her images with radiology.  I will send her for an acute x-ray today given she is feeling poorly to make sure no obstructive changes.  We will send her to the lab for blood work to make sure stable and no iron deficiency etc. since have last seen her.  We will also check inflammatory markers and a TSH.  For treatment of her constipation, as long as she is not obstructed, we will switch Motegrity to Linzess 290 mcg/day and see if that helps.  She may need to do a MiraLAX bowel prep to purge her bowel and clear her colon if she can tolerate it.  She will also continue high-dose MiraLAX daily if needed.  For her hemorrhoids she can try topical 1% hydrocortisone cream pea-sized amount PR 3 times daily for 1 week or use over-the-counter Calmol 4 suppositories as needed.  I will give her some samples of IBgard to use as needed for bowel spasm see if that helps.  For her rectal prolapse and bladder prolapse and recommending referral to pelvic floor PT, although she has no evidence of pelvic floor dysfunction on exam today.  She otherwise has been told she has gallstones and sludge in her gallbladder.  Has been put on ursodiol for the past 3 years to try to treat symptoms as opposed to removing her gallbladder, given the surgeons in [place] she was told would not remove her gallbladder if she had  Crohn's disease.  She certainly has symptoms that are concerning for biliary colic.  I will refer her to a surgeon at [place] here to evaluate for possible cholecystectomy.  She is in town for 6 weeks, will place referral ASAP just to make sure she has enough time to get this done while she is here, if needed.  Otherwise we may need to give her a dose of Entyvio while she is in town in July for her Crohn's disease.  We may need to pursue additional imaging while she is here if we can get the CDs uploaded to our servers, also may need to consider colonoscopy based on the findings and her course to ensure no evidence of anastomotic stricture or active Crohn's disease that could be related to her symptoms.  She understands and agrees with the plan.  I will follow-up with them once I get results of imaging or labs in the near future.  Plan: - lab today CBC, CMET, CRP, TIBC / ferritin, TSH - abdominal x ray 2 view today - will try to upload most recent CT  scan from [place] as well as defacography study - referral to [facility] general surgery for biliary colic , consideration for cholecystectomy - Zofran 61m ODT every 6 hours PRN #30 RF1 - stop motegrity - trial of Linzess samples 2911m / day if no evidence of obstruction on imaging - continue Miralax - add topical 1% hydrocortisone cream or Calmol4 suppositories PRN for hemorrhoids - samples of IB gard PRN - referral to pelvic floor PT for rectal / bladder prolapse - may need to coordinate Entyvio infusion in July prior to her leaving town  Further recommendations pending her course and results of this exam. I spent 90 minutes reviewing her chart, time with patient, ordering studies, and documenting this note.  StJolly MangoMD [facility]  CC: BeJinny SandersMD

## 2022-06-13 NOTE — Patient Instructions (Addendum)
If you are age 46 or older, your body mass index should be between 23-30. Your Body mass index is 25.92 kg/m. If this is out of the aforementioned range listed, please consider follow up with your Primary Care Provider.  If you are age 22 or younger, your body mass index should be between 19-25. Your Body mass index is 25.92 kg/m. If this is out of the aformentioned range listed, please consider follow up with your Primary Care Provider.   ________________________________________________________  The [place] [facility] providers would like to encourage you to use [facility] to communicate with providers for non-urgent requests or questions.  Due to long hold times on the telephone, sending your provider a message by [facility] may be a faster and more efficient way to get a response.  Please allow 48 business hours for a response.  Please remember that this is for non-urgent requests.  _______________________________________________________  Your provider has requested that you go to the basement level for abdominal Xray before leaving today. Press "B" on the elevator. You will proceed to the imaging room.   You have been scheduled for an appointment with ________ at [facility]. Your appointment is on ______ at ______. Please arrive at ______ for registration. Make certain to bring a list of current medications, including any over the counter medications or vitamins. Also bring your co-pay if you have one as well as your insurance cards. [facility] is located at [address]. Should you need to reschedule your appointment, please contact them at [phone].  Your provider has requested that you go to the basement level for lab work before leaving today. Press "B" on the elevator. The lab is located at the first door on the left as you exit the elevator.  We have sent the following medications to your pharmacy for you to pick up at your convenience:  START: Zofran  34m one tablet every 6 hours as needed  DISCONTINUE: Motegrity  START: Linzess 290 mcg daily (samples given)  CONTINUE: Miralax daily.  Please purchase the following medications over the counter and take as directed:  You can use topical 1% hydrocortisone cream or Calmol4 suppositories as needed.  Samples of IB gard as needed (samples given)  You have been referred to pelvic floor physical therapy for rectal / bladder prolapse.  Someone will contact you about getting this scheduled.  Due to recent changes in healthcare laws, you may see the results of your imaging and laboratory studies on MyChart before your provider has had a chance to review them.  We understand that in some cases there may be results that are confusing or concerning to you. Not all laboratory results come back in the same time frame and the provider may be waiting for multiple results in order to interpret others.  Please give [id number] hours in order for your provider to thoroughly review all the results before contacting the office for clarification of your results.   Thank you for entrusting me with your care and choosing [facility]  Dr AHavery Moros

## 2022-06-13 NOTE — Telephone Encounter (Signed)
Received 2 discs from Dr. Havery Moros. He requested that images be uploaded into patient's [place] chart. Both discs will be delivered (by [facility]) to [facility] in [place]. [facility] will upload the images to patient's chart and will return the discs to our office once completed. I provided Sheri with the 2 discs enclosed in a large envelope along with a note with the patient's information, our office address for return, and the office contact #. I was informed that they are only in the office 1 day a week, the day varies weekly.  Will follow up with [facility] at a later time.

## 2022-06-14 ENCOUNTER — Encounter: Payer: Self-pay | Admitting: Gastroenterology

## 2022-06-14 DIAGNOSIS — D500 Iron deficiency anemia secondary to blood loss (chronic): Secondary | ICD-10-CM | POA: Insufficient documentation

## 2022-06-14 NOTE — Addendum Note (Signed)
Addended by: Yevette Edwards on: 06/14/2022 01:51 PM   Modules accepted: Orders

## 2022-06-15 ENCOUNTER — Other Ambulatory Visit: Payer: Self-pay | Admitting: Family Medicine

## 2022-06-15 ENCOUNTER — Other Ambulatory Visit: Payer: Self-pay

## 2022-06-15 ENCOUNTER — Encounter: Payer: Self-pay | Admitting: Family Medicine

## 2022-06-15 DIAGNOSIS — K50918 Crohn's disease, unspecified, with other complication: Secondary | ICD-10-CM

## 2022-06-15 DIAGNOSIS — R109 Unspecified abdominal pain: Secondary | ICD-10-CM

## 2022-06-15 DIAGNOSIS — K625 Hemorrhage of anus and rectum: Secondary | ICD-10-CM

## 2022-06-18 ENCOUNTER — Ambulatory Visit
Admission: RE | Admit: 2022-06-18 | Discharge: 2022-06-18 | Disposition: A | Discharge status: Home / Self Care | Payer: Self-pay | Source: Ambulatory Visit | Attending: Gastroenterology | Admitting: Gastroenterology

## 2022-06-18 ENCOUNTER — Other Ambulatory Visit: Payer: Self-pay

## 2022-06-18 DIAGNOSIS — K5900 Constipation, unspecified: Secondary | ICD-10-CM

## 2022-06-18 DIAGNOSIS — K50918 Crohn's disease, unspecified, with other complication: Secondary | ICD-10-CM

## 2022-06-18 DIAGNOSIS — R109 Unspecified abdominal pain: Secondary | ICD-10-CM

## 2022-06-18 DIAGNOSIS — K625 Hemorrhage of anus and rectum: Secondary | ICD-10-CM

## 2022-06-18 MED ORDER — ZOLPIDEM TARTRATE 10 MG PO TABS
10.0000 mg | ORAL_TABLET | Freq: Every day | ORAL | 0 refills | Status: DC
Start: 2022-06-18 — End: 2024-07-07

## 2022-06-18 NOTE — Telephone Encounter (Signed)
Discs have been processed and returned to our office. Orders have been placed so that imaging can be attached accordingly. I spoke with Andrey Campanile at [facility], she will link the images to the orders and it should be available today.

## 2022-06-19 ENCOUNTER — Ambulatory Visit: Payer: 59 | Admitting: Gastroenterology

## 2022-06-19 ENCOUNTER — Other Ambulatory Visit: Payer: Self-pay | Admitting: Surgery

## 2022-06-19 ENCOUNTER — Telehealth: Payer: Self-pay | Admitting: Pharmacy Technician

## 2022-06-19 ENCOUNTER — Other Ambulatory Visit (HOSPITAL_COMMUNITY): Payer: Self-pay | Admitting: Surgery

## 2022-06-19 DIAGNOSIS — R1011 Right upper quadrant pain: Secondary | ICD-10-CM

## 2022-06-19 NOTE — Telephone Encounter (Signed)
Auth Submission: no auth needed Payer: [employer]etna Medication & CPT/J Code(s) submitted: ferrlecit [phone] Route of submission (phone, fax, portal): PORTAL/PHONE Auth type: Buy/Bill Units/visits requested: 4 VISITS Reference number: [id number] 06/19/22 @3 :14P Approval from: 06/19/22 to 07/19/22

## 2022-06-20 ENCOUNTER — Telehealth: Payer: Self-pay | Admitting: *Deleted

## 2022-06-20 MED ORDER — AMLODIPINE BESY-BENAZEPRIL HCL 5-10 MG PO CAPS: 1.0000 | ORAL_CAPSULE | Freq: Every day | ORAL | 0 refills | Status: DC

## 2022-06-20 NOTE — Telephone Encounter (Signed)
Received fax from [facility] requesting PA for Zolpidem 10 mg.  PA completed on CoverMyMeds and sent to [facility] for review.  Can take up to 72 hours for a decision.

## 2022-06-21 ENCOUNTER — Telehealth: Payer: Self-pay

## 2022-06-21 ENCOUNTER — Ambulatory Visit (INDEPENDENT_AMBULATORY_CARE_PROVIDER_SITE_OTHER): Payer: 59

## 2022-06-21 ENCOUNTER — Encounter: Payer: Self-pay | Admitting: Physical Therapy

## 2022-06-21 ENCOUNTER — Encounter: Payer: 59 | Admitting: Physical Therapy

## 2022-06-21 ENCOUNTER — Ambulatory Visit (HOSPITAL_COMMUNITY)
Admission: RE | Admit: 2022-06-21 | Discharge: 2022-06-21 | Disposition: A | Discharge status: Home / Self Care | Payer: 59 | Source: Ambulatory Visit | Attending: Surgery | Admitting: Surgery

## 2022-06-21 VITALS — BP 102/66 | HR 95 | Temp 98.4°F | Resp 18 | Ht 64.0 in | Wt 154.6 lb

## 2022-06-21 DIAGNOSIS — D500 Iron deficiency anemia secondary to blood loss (chronic): Secondary | ICD-10-CM

## 2022-06-21 DIAGNOSIS — R1011 Right upper quadrant pain: Secondary | ICD-10-CM | POA: Diagnosis present

## 2022-06-21 MED ORDER — SODIUM CHLORIDE 0.9 % IV SOLN
250.0000 mg | Freq: Once | INTRAVENOUS | Status: AC
  Administered 2022-06-21: 250 mg via INTRAVENOUS
  Filled 2022-06-21: qty 20

## 2022-06-21 NOTE — Telephone Encounter (Signed)
Called and spoke with patient and her husband regarding Dr. Payton Emerald recommendations. We have reviewed symptoms that will require ED evaluation. Pt is interested in changing IV iron formulary in hopes of not having a repeat reaction. I told her that I will check with you and give them a call back tomorrow. Pt and her husband verbalized understanding and had no concerns at the end of the call.   Would you like to change to [facility]?

## 2022-06-21 NOTE — Telephone Encounter (Signed)
Dr. Tarri Glenn as DOD PM of 06/21/22  Dr. Doyne Keel pt with IDA, received 1st dose of Ferrelicit today.  Dr. Tarri Glenn, pt had her first IV iron infusion today. The pt and her husband called in because they are concerned that pt is having a reaction. Pt waited 30 minutes after her infusion as directed. It took her 20 minutes to get home and by the time she got home she had significant amount of swelling in her hands, feet, and ankles. They called the infusion center and they asked if she had any itching or facial swelling, the pt responded no and they told the pt to call us. Pt still has swelling in her extremities. She has also vomited about 3-4 times, large volume, she stated that it even came through her nose. Pt is sucking on ice. She has not tried to drink any liquids. No fever. Denies any facial, throat, or mouth swelling. Please advise, thanks.

## 2022-06-21 NOTE — Therapy (Signed)
OUTPATIENT PHYSICAL THERAPY FEMALE PELVIC EVALUATION   Patient Name: [name] MRN: [id number] DOB:Nov 12, 1976, 46 y.o., female Today's Date: 06/21/2022    Past Medical History:  Diagnosis Date   Anxiety    Asthma    Chest pain    a. s/p intermediate risk nuclear stress test on 09/23/14 and normal LHC on 09/24/14    Chronic nausea    Colonic inertia    Crohn disease ([place]) 2000   Diverticulitis    Dysautonomia ([place])    recurrent syncope s/p ILR by Dr Doreatha Lew   Endometriosis    Essential hypertension 03/17/2017   a - Renal artery [name] 3/18: normal     GERD (gastroesophageal reflux disease)    H/O hiatal hernia    Migraine    Palpitations    PONV (postoperative nausea and vomiting)    Pulmonary embolism affecting pregnancy    Vertigo    Past Surgical History:  Procedure Laterality Date   49 HOUR [place] STUDY N/A 12/31/2016   Procedure: 24 HOUR [place] STUDY;  Surgeon: Manus Gunning, MD;  Location: [facility];  Service: Gastroenterology;  Laterality: N/A;   ANTERIOR CERVICAL DECOMP/DISCECTOMY FUSION N/A 07/19/2017   Procedure: Cervical five-six Anterior cervical decompression/discectomy/fusion;  Surgeon: Erline Levine, MD;  Location: [facility];  Service: Neurosurgery;  Laterality: N/A;  C5-6 Anterior cervical decompression/discectomy/fusion   APPENDECTOMY  ~ [place]  2011   emergency  CS due to placental abruption   CESAREAN SECTION  2014   "preeclampsia"   COLON SURGERY     CYSTOSCOPY W/ STONE MANIPULATION  X 2   ENDOSCOPIC RELEASE TRANSVERSE CARPAL LIGAMENT OF HAND Right 12/2010   ESOPHAGEAL MANOMETRY N/A 12/31/2016   Procedure: ESOPHAGEAL MANOMETRY (EM);  Surgeon: Manus Gunning, MD;  Location: [facility];  Service: Gastroenterology;  Laterality: N/A;   GASTROCNEMIUS RECESSION Right 07/08/2020   Procedure: GASTROCNEMIUS SLIDE;  Surgeon: [name], MD;  Location: [facility];  Service: Orthopedics;   Laterality: Right;   HARDWARE REMOVAL Right 07/08/2020   Procedure: HARDWARE REMOVAL;  Surgeon: [name], MD;  Location: [facility];  Service: Orthopedics;  Laterality: Right;   ILEOCECETOMY  2002 X 2   KNEE ARTHROSCOPY Bilateral 1988-1990   LAPAROSCOPY FOR ECTOPIC PREGNANCY  11/2012   LEFT HEART CATHETERIZATION WITH CORONARY ANGIOGRAM N/A 09/24/2014   Procedure: LEFT HEART CATHETERIZATION WITH CORONARY ANGIOGRAM;  Surgeon: Leonie Man, MD;  Location: [facility];  Service: Cardiovascular;  Laterality: N/A;   LOOP RECORDER IMPLANT  11/2009   by DR Doreatha Lew   LOOP RECORDER REMOVAL N/A 07/05/2017   Procedure: Loop Recorder Removal;  Surgeon: Evans Lance, MD;  Location: [facility];  Service: Cardiovascular;  Laterality: N/A;   NASAL SINUS SURGERY  ~ 2007   OPEN REDUCTION INTERNAL FIXATION (ORIF) FOOT LISFRANC FRACTURE Right 07/18/2018   Procedure: OPEN REDUCTION INTERNAL FIXATION (ORIF) RIGHT TARSAL METATARSAL FRACTURE and DEPO INJECTION;  Surgeon: [name], MD;  Location: [facility];  Service: Orthopedics;  Laterality: Right;   PARTIAL COLECTOMY  2002   partial removed due to crohns   TONSILLECTOMY  ~ 1996   US ECHOCARDIOGRAPHY  11/11/2009   EF 55-60%   Patient Active Problem List   Diagnosis Date Noted   Iron deficiency anemia due to chronic blood loss 06/14/2022   Herniated cervical disc 07/19/2017   Essential hypertension 03/17/2017   Fluctuating blood pressure  02/28/2017   Immunosuppressed status ([place]) 03/27/2016   High risk medication use 07/19/2015   Long-term (current) use of anticoagulants 07/19/2015   Depression, major, in remission ([place]) 06/10/2015   History of hyperemesis gravidarum 11/08/2014   Dysautonomia ([place])    Palpitations    HLD (hyperlipidemia) 09/23/2014   Elevated LFTs 10/21/2012   Generalized anxiety disorder 10/06/2012   BPPV (benign paroxysmal positional vertigo) 03/15/2012   Crohn's disease ([place])  07/27/2011   Reactive arthritis due to crohn's flares 07/27/2011   Asthma, moderate persistent 07/27/2011   Gastroesophageal reflux disease without esophagitis 07/27/2011   Allergic rhinitis 07/27/2011   Endometriosis 07/27/2011   Migraine 07/27/2011   Calcium nephrolithiasis 07/27/2011   Syncope 11/10/2010    PCP: Jinny Sanders, MD  REFERRING PROVIDER: Yetta Flock, MD  REFERRING DIAG: K50.918 (ICD-10-CM) - Crohn's disease with other complication, unspecified gastrointestinal tract location ([place]) K59.00 (ICD-10-CM) - Constipation, unspecified constipation type K62.5 (ICD-10-CM) - Rectal bleeding R10.9 (ICD-10-CM) - Abdominal pain, unspecified abdominal location K80.50 (FVC-94-WH) - Biliary colic Q75.9 (FMB-84-YK) - Rectal prolapse  THERAPY DIAG:  No diagnosis found.  Rationale for Evaluation and Treatment Rehabilitation  ONSET DATE: 11/2021  SUBJECTIVE:                                                                                                                                                                                           SUBJECTIVE STATEMENT: Starting leaking urine in underwear. Patient would bleed rectally. Strain to have a bowel movement she would feel the stool in the rectocele. Section of intestines that is not working. On medication to make her have a bowel movement.  Fluid intake: Yes: coffee in AM, water mostly     PAIN:  Are you having pain? Yes NPRS scale: 8/10 high and low is 3/10 Pain location:  upper right quadrant  Pain type: feels like a ball with sharp claws on it Pain description: constant   Aggravating factors: after she eats Relieving factors: heating pad  PRECAUTIONS: None  WEIGHT BEARING RESTRICTIONS No  FALLS:  Has patient fallen in last 6 months? No  LIVING ENVIRONMENT: Lives with: lives with their family   OCCUPATION: Pharmacist, hospital,   PLOF: Independent  PATIENT GOALS avoid surgery for the prolapse, be able to  sleep through the night without urinating  PERTINENT HISTORY:  Endometriosis, Crohn disease, c-section x2, iliocecetomy 2002x2   BOWEL MOVEMENT Pain with bowel movement: No Type of bowel movement:Type (Bristol Stool Scale) Type 5, Frequency every other day with medication, Strain Yes, and Splinting if the stool is solid Fully empty rectum: Yes:   Leakage: No Pads: No Fiber supplement:  No   PLAN FOR NEXT SESSION: Patient started to vomit and had swelling in legs and hands from her infusion today so eval was stopped. She is scheduled for next Monday to complete her eval and devise a plan for care.    Earlie Counts, PT 06/21/22 4:47 PM

## 2022-06-21 NOTE — Telephone Encounter (Signed)
Case Id:[id number];Status:Approved;Review Type:Prior Auth;Coverage Start Date:05/21/2022;Coverage End Date:06/20/2023;

## 2022-06-21 NOTE — Progress Notes (Signed)
Diagnosis: Iron Deficiency Anemia  Provider:  Marshell Garfinkel, MD  Procedure: Infusion  IV Type: Peripheral, IV Location: L Hand  Ferric Gluconate, Dose: 259m  Infusion Start Time: 16568 Infusion Stop Time: 11275 Post Infusion IV Care: Peripheral IV Discontinued  Discharge: Condition: Good, Destination: Home . AVS provided to patient.   Performed by:  MPaul Dykes RN

## 2022-06-22 ENCOUNTER — Telehealth: Payer: Self-pay

## 2022-06-22 ENCOUNTER — Encounter: Payer: Self-pay | Admitting: Gastroenterology

## 2022-06-22 MED ORDER — LINACLOTIDE 290 MCG PO CAPS: 290.0000 ug | ORAL_CAPSULE | Freq: Every day | ORAL | 2 refills | Status: DC

## 2022-06-22 NOTE — Telephone Encounter (Signed)
Sorry to hear this. Thanks Joelene Millin for your advice / helping.  [place] can you touch base with the patient and see how she is doing? Infusion center should be aware of this. Not sure of risk of this happening with another formulation but we could try another option if okay with the infusion center. Not sure if they give any premedication for IV iron but can consider that. I think the patient has tolerated IV iron in the past, not sure which form she previously received. Thanks

## 2022-06-22 NOTE — Telephone Encounter (Signed)
See 06/22/22 MyChart message.

## 2022-06-22 NOTE — Addendum Note (Signed)
Addended by: Yevette Edwards on: 06/22/2022 12:11 PM   Modules accepted: Orders

## 2022-06-25 ENCOUNTER — Other Ambulatory Visit (HOSPITAL_COMMUNITY): Payer: Self-pay | Admitting: Surgery

## 2022-06-25 ENCOUNTER — Ambulatory Visit: Payer: 59 | Attending: Gastroenterology | Admitting: Physical Therapy

## 2022-06-25 ENCOUNTER — Encounter: Payer: Self-pay | Admitting: Physical Therapy

## 2022-06-25 ENCOUNTER — Telehealth: Payer: Self-pay | Admitting: Pharmacy Technician

## 2022-06-25 ENCOUNTER — Other Ambulatory Visit: Payer: Self-pay | Admitting: Surgery

## 2022-06-25 DIAGNOSIS — R1011 Right upper quadrant pain: Secondary | ICD-10-CM

## 2022-06-25 DIAGNOSIS — K5900 Constipation, unspecified: Secondary | ICD-10-CM | POA: Insufficient documentation

## 2022-06-25 DIAGNOSIS — M6281 Muscle weakness (generalized): Secondary | ICD-10-CM | POA: Diagnosis not present

## 2022-06-25 DIAGNOSIS — K50918 Crohn's disease, unspecified, with other complication: Secondary | ICD-10-CM | POA: Diagnosis not present

## 2022-06-25 DIAGNOSIS — K8050 Calculus of bile duct without cholangitis or cholecystitis without obstruction: Secondary | ICD-10-CM | POA: Insufficient documentation

## 2022-06-25 DIAGNOSIS — K623 Rectal prolapse: Secondary | ICD-10-CM | POA: Diagnosis not present

## 2022-06-25 DIAGNOSIS — R109 Unspecified abdominal pain: Secondary | ICD-10-CM | POA: Insufficient documentation

## 2022-06-25 DIAGNOSIS — K625 Hemorrhage of anus and rectum: Secondary | ICD-10-CM | POA: Diagnosis not present

## 2022-06-25 DIAGNOSIS — N816 Rectocele: Secondary | ICD-10-CM | POA: Diagnosis not present

## 2022-06-25 DIAGNOSIS — N8110 Cystocele, unspecified: Secondary | ICD-10-CM | POA: Diagnosis not present

## 2022-06-25 NOTE — Therapy (Signed)
OUTPATIENT PHYSICAL THERAPY FEMALE PELVIC EVALUATION   Patient Name: [name] MRN: [id number] DOB:02/06/1976, 46 y.o., female Today's Date: 06/25/2022   PT End of Session - 06/25/22 1403     Visit Number 1    Date for PT Re-Evaluation 08/20/22    Authorization Type [employer]    PT Start Time 1400    PT Stop Time 1440    PT Time Calculation (min) 40 min    Activity Tolerance Patient tolerated treatment well    Behavior During Therapy [facility] for tasks assessed/performed             Past Medical History:  Diagnosis Date   Anxiety    Asthma    Chest pain    a. s/p intermediate risk nuclear stress test on 09/23/14 and normal LHC on 09/24/14    Chronic nausea    Colonic inertia    Crohn disease ([place]) 2000   Diverticulitis    Dysautonomia ([place])    recurrent syncope s/p ILR by Dr Doreatha Lew   Endometriosis    Essential hypertension 03/17/2017   a - Renal artery [name] 3/18: normal     GERD (gastroesophageal reflux disease)    H/O hiatal hernia    Migraine    Palpitations    PONV (postoperative nausea and vomiting)    Pulmonary embolism affecting pregnancy    Vertigo    Past Surgical History:  Procedure Laterality Date   76 HOUR [place] STUDY N/A 12/31/2016   Procedure: 24 HOUR [place] STUDY;  Surgeon: Manus Gunning, MD;  Location: [facility];  Service: Gastroenterology;  Laterality: N/A;   ANTERIOR CERVICAL DECOMP/DISCECTOMY FUSION N/A 07/19/2017   Procedure: Cervical five-six Anterior cervical decompression/discectomy/fusion;  Surgeon: Erline Levine, MD;  Location: [facility];  Service: Neurosurgery;  Laterality: N/A;  C5-6 Anterior cervical decompression/discectomy/fusion   APPENDECTOMY  ~ [place]  2011   emergency  CS due to placental abruption   CESAREAN SECTION  2014   "preeclampsia"   COLON SURGERY     CYSTOSCOPY W/ STONE MANIPULATION  X 2   ENDOSCOPIC RELEASE TRANSVERSE CARPAL LIGAMENT OF HAND Right 12/2010   ESOPHAGEAL MANOMETRY  N/A 12/31/2016   Procedure: ESOPHAGEAL MANOMETRY (EM);  Surgeon: Manus Gunning, MD;  [facility];  Service: Gastroenterology;  Laterality: N/A;   GASTROCNEMIUS RECESSION Right 07/08/2020   Procedure: GASTROCNEMIUS SLIDE;  Surgeon: [name], MD;  Location: [facility];  Service: Orthopedics;  Laterality: Right;   HARDWARE REMOVAL Right 07/08/2020   Procedure: HARDWARE REMOVAL;  Surgeon: [name], MD;  Location: [facility];  Service: Orthopedics;  Laterality: Right;   ILEOCECETOMY  2002 X 2   KNEE ARTHROSCOPY Bilateral 1988-1990   LAPAROSCOPY FOR ECTOPIC PREGNANCY  11/2012   LEFT HEART CATHETERIZATION WITH CORONARY ANGIOGRAM N/A 09/24/2014   Procedure: LEFT HEART CATHETERIZATION WITH CORONARY ANGIOGRAM;  Surgeon: Leonie Man, MD;  Location: [facility] C[facility] LAB;  Service: Cardiovascular;  Laterality: N/A;   LOOP RECORDER IMPLANT  11/2009   by DR Doreatha Lew   LOOP RECORDER REMOVAL N/A 07/05/2017   Procedure: Loop Recorder Removal;  Surgeon: Evans Lance, MD;  Location: [facility];  Service: Cardiovascular;  Laterality: N/A;   NASAL SINUS SURGERY  ~ 2007   OPEN REDUCTION INTERNAL FIXATION (ORIF) FOOT LISFRANC FRACTURE Right 07/18/2018   Procedure: OPEN REDUCTION INTERNAL FIXATION (ORIF) RIGHT TARSAL METATARSAL FRACTURE and DEPO INJECTION;  Surgeon: Edmonia Lynch  D, MD;  Location: [facility];  Service: Orthopedics;  Laterality: Right;   PARTIAL COLECTOMY  2002   partial removed due to crohns   TONSILLECTOMY  ~ 1996   US ECHOCARDIOGRAPHY  11/11/2009   EF 55-60%   Patient Active Problem List   Diagnosis Date Noted   Iron deficiency anemia due to chronic blood loss 06/14/2022   Herniated cervical disc 07/19/2017   Essential hypertension 03/17/2017   Fluctuating blood pressure 02/28/2017   Immunosuppressed status ([place]) 03/27/2016   High risk medication use 07/19/2015   Long-term (current) use of anticoagulants  07/19/2015   Depression, major, in remission ([place]) 06/10/2015   History of hyperemesis gravidarum 11/08/2014   Dysautonomia ([place])    Palpitations    HLD (hyperlipidemia) 09/23/2014   Elevated LFTs 10/21/2012   Generalized anxiety disorder 10/06/2012   BPPV (benign paroxysmal positional vertigo) 03/15/2012   Crohn's disease ([place]) 07/27/2011   Reactive arthritis due to crohn's flares 07/27/2011   Asthma, moderate persistent 07/27/2011   Gastroesophageal reflux disease without esophagitis 07/27/2011   Allergic rhinitis 07/27/2011   Endometriosis 07/27/2011   Migraine 07/27/2011   Calcium nephrolithiasis 07/27/2011   Syncope 11/10/2010    PCP: Jinny Sanders, MD  REFERRING PROVIDER: Yetta Flock, MD  REFERRING DIAG:  K50.918 (ICD-10-CM) - Crohn's disease with other complication, unspecified gastrointestinal tract location ([place]) K59.00 (ICD-10-CM) - Constipation, unspecified constipation type K62.5 (ICD-10-CM) - Rectal bleeding R10.9 (ICD-10-CM) - Abdominal pain, unspecified abdominal location K80.50 (PYK-99-IP) - Biliary colic J82.5 (KNL-97-QB) - Rectal prolapse  THERAPY DIAG:  Muscle weakness (generalized)  Posterior vaginal wall prolapse  Prolapse of anterior vaginal wall  Rationale for Evaluation and Treatment Rehabilitation  ONSET DATE: 11/2021  SUBJECTIVE:                                                                                                                                                                                           SUBJECTIVE STATEMENT: Starting leaking urine in underwear. Patient would bleed rectally. Strain to have a bowel movement she would feel the stool in the rectocele. Section of intestines that is not working. On medication to make her have a bowel movement.  Fluid intake: Yes: coffee in AM, water mostly      PAIN:  Are you having pain? Yes NPRS scale: 8/10 high and low is 3/10 Pain location:  upper right quadrant   Pain  type: feels like a ball with sharp claws on it Pain description: constant    Aggravating factors: after she eats Relieving factors: heating pad PRECAUTIONS: None   WEIGHT BEARING RESTRICTIONS No   FALLS:  Has  patient fallen in last 6 months? No   LIVING ENVIRONMENT: Lives with: lives with their family     OCCUPATION: Pharmacist, hospital,    PLOF: Independent   PATIENT GOALS avoid surgery for the prolapse, be able to sleep through the night without urinating   PERTINENT HISTORY:  Endometriosis, Crohn disease, c-section x2, iliocecetomy 2002x2     BOWEL MOVEMENT Pain with bowel movement: No Type of bowel movement:Type (Bristol Stool Scale) Type 5, Frequency every other day with medication, Strain Yes, and Splinting if the stool is solid Fully empty rectum: Yes:   Leakage: No Pads: No Fiber supplement: No   PROLAPSE Cystocele   and Rectocele      OBJECTIVE:   DIAGNOSTIC FINDINGS:  none  PATIENT SURVEYS:  none  PFIQ-7 none  COGNITION:  Overall cognitive status: Within functional limits for tasks assessed     SENSATION:  Light touch: Appears intact  Proprioception: Appears intact                 POSTURE: No Significant postural limitations   PELVIC ALIGNMENT:  LUMBARAROM/PROM;  Full   LOWER EXTREMITY ROM:  Passive ROM Right eval Left eval  Hip abduction 4/5 3+/5  Hip adduction 4/5 4/5   (Blank rows = not tested)  LOWER EXTREMITY MMT:  MMT Right eval Left eval  Hip internal rotation 20 30  Hip external rotation 55 60    PALPATION:   General  right abdominal has soreness. Good mobility of abdominal scar, tightness on the right side of the umbilicus.                 External Perineal Exam good                             Internal Pelvic Floor tightness on the right pelvic floor muscles  Patient confirms identification and approves PT to assess internal pelvic floor and treatment Yes  PELVIC MMT:   MMT eval  Vaginal 3/5 after manual work to  the right pelvic floor and introitus  Internal Anal Sphincter   External Anal Sphincter   Puborectalis   Diastasis Recti   (Blank rows = not tested)        TONE: good  PROLAPSE: Anterior and posterior wall weakness to the introitus  TODAY'S TREATMENT  EVAL Date:  HEP established-see below  Education on what a prolapse is and how it affects the pelvic floor  PATIENT EDUCATION:  Education details: Access Code: [email or web address] Person educated: Patient Education method: Explanation, Demonstration, Tactile cues, Verbal cues, and Handouts Education comprehension: verbalized understanding, returned demonstration, verbal cues required, tactile cues required, and needs further education   HOME EXERCISE PROGRAM: Access Code: [email or web address] URL: [email or web address] Date: 06/25/2022 Prepared by: Earlie Counts  Exercises - Supine Hip Adduction Isometric with Ball  - 1 x daily - 7 x weekly - 1 sets - 10 reps - 5 sec hold - Hooklying Isometric Hip Flexion  - 1 x daily - 7 x weekly - 1 sets - 5 reps - 5 sec hold - Supine Bridge with Mini Swiss Ball Between Knees  - 1 x daily - 7 x weekly - 1 sets - 10 reps  ASSESSMENT:  CLINICAL IMPRESSION: Patient is a 46 y.o. female  who was seen today for physical therapy evaluation and treatment for anterior and posterior prolapse. Patient is in the [place] for several weeks to get her medical care. She lives in [place]  with her husband and children. Patient has weakness in her hips. Her pelvic floor strength is 3/5 after manual work. She has tightness in the right pelvic floor muscles. Patient has anterior and posterior wall weakness that goes to the introitus when she bulges the pelvic floor. Patient does have to strain with a bowel movement. She will splint in the vaginal canal by pushing on the posterior wall. Patient has pain on right abdominal area that the doctors are running test to figure out what is causing the pain. Patient would benefit  from skilled therapy to work on prolapse management, pelvic floor strengthening, and core strength.    OBJECTIVE IMPAIRMENTS decreased activity tolerance and decreased coordination. Decreased strength  ACTIVITY LIMITATIONS toileting  PARTICIPATION LIMITATIONS: shopping and community activity  PERSONAL FACTORS Endometriosis, Crohn disease, c-section x2, iliocecetomy 2002x2 are also affecting patient's functional outcome.   REHAB POTENTIAL: Good  CLINICAL DECISION MAKING: Evolving/moderate complexity  EVALUATION COMPLEXITY: Moderate   GOALS: Goals reviewed with patient? Yes  SHORT TERM GOALS: Target date:  less than  4 weeks so no STG's   LONG TERM GOALS: Target date: 07/23/2022   Patient understands how to manage prolapse to reduce in supine.  Baseline:  Goal status: INITIAL  2.  Pelvic floor contraction is 3/5 with no abdominal bulging.  Baseline:  Goal status: INITIAL  3.  Patient understands pressure management to reduce strain on the prolapse.  Baseline:  Goal status: INITIAL  4.  Patient understands correct body mechanics with home task to reduce strain on prolapse.  Baseline:  Goal status: INITIAL  5.  Patient is independent with pelvic floor and core strength.  Baseline:  Goal status: INITIAL   PLAN: PT FREQUENCY: 1-2x/week  PT DURATION: 4 weeks  PLANNED INTERVENTIONS: Therapeutic exercises, Therapeutic activity, Neuromuscular re-education, Patient/Family education, Biofeedback, and Manual therapy  PLAN FOR NEXT SESSION: Educated patient on pressure management of prolapse; pelvic floor strength with legs on couch, hinging at hips, breath on hardest part of activity   Earlie Counts, PT 06/25/22 4:52 PM

## 2022-06-25 NOTE — Telephone Encounter (Signed)
Noted, thanks!

## 2022-06-25 NOTE — Telephone Encounter (Addendum)
Dr. Havery Moros, [facility] note:  Auth Submission: no auth needed Predetermination letter has been faxed Fax: [phone] PHONE: [phone] Payer: Holland Falling- Medication & CPT/J Code(s) submitted: monoferric [id number] Route of submission (phone, fax, portal): PHONE/PORTAL Auth type: Buy/Bill Units/visits requested: X1 DOSE Reference number: 1ST verify  Carl-E 06/25/22 11:16 2ND VERIFY: Felix-S 06/25/22 @11 :32 Approval from: 06/25/22 to 07/26/22   Patient will be scheduled as soon as possible.  MON[employer]ERRIC CO-PAY CARD: approved 07/06/22 -12/23/22 Id: [id number]

## 2022-06-27 ENCOUNTER — Ambulatory Visit (HOSPITAL_COMMUNITY)
Admission: RE | Admit: 2022-06-27 | Discharge: 2022-06-27 | Disposition: A | Discharge status: Home / Self Care | Payer: 59 | Source: Ambulatory Visit | Attending: Gastroenterology | Admitting: Gastroenterology

## 2022-06-27 DIAGNOSIS — R109 Unspecified abdominal pain: Secondary | ICD-10-CM | POA: Diagnosis present

## 2022-06-27 DIAGNOSIS — K625 Hemorrhage of anus and rectum: Secondary | ICD-10-CM | POA: Diagnosis present

## 2022-06-27 DIAGNOSIS — K50918 Crohn's disease, unspecified, with other complication: Secondary | ICD-10-CM | POA: Insufficient documentation

## 2022-06-27 MED ORDER — IOHEXOL 300 MG/ML  SOLN
100.0000 mL | Freq: Once | INTRAMUSCULAR | Status: AC | PRN
  Administered 2022-06-27: 100 mL via INTRAVENOUS

## 2022-06-28 ENCOUNTER — Encounter: Payer: Self-pay | Admitting: Gastroenterology

## 2022-06-28 ENCOUNTER — Ambulatory Visit (HOSPITAL_COMMUNITY)
Admission: RE | Admit: 2022-06-28 | Discharge: 2022-06-28 | Disposition: A | Discharge status: Home / Self Care | Payer: 59 | Source: Ambulatory Visit | Attending: Surgery | Admitting: Surgery

## 2022-06-28 ENCOUNTER — Encounter: Payer: Self-pay | Admitting: Physical Therapy

## 2022-06-28 ENCOUNTER — Encounter: Payer: 59 | Admitting: Physical Therapy

## 2022-06-28 DIAGNOSIS — R1011 Right upper quadrant pain: Secondary | ICD-10-CM | POA: Diagnosis present

## 2022-06-28 DIAGNOSIS — M6281 Muscle weakness (generalized): Secondary | ICD-10-CM

## 2022-06-28 DIAGNOSIS — K50918 Crohn's disease, unspecified, with other complication: Secondary | ICD-10-CM | POA: Diagnosis not present

## 2022-06-28 DIAGNOSIS — N816 Rectocele: Secondary | ICD-10-CM

## 2022-06-28 DIAGNOSIS — N8110 Cystocele, unspecified: Secondary | ICD-10-CM

## 2022-06-28 MED ORDER — TECHNETIUM TC 99M MEBROFENIN IV KIT
5.0000 | PACK | Freq: Once | INTRAVENOUS | Status: AC | PRN
Start: 2022-06-28 — End: 2022-06-28
  Administered 2022-06-28: 5 via INTRAVENOUS

## 2022-06-28 NOTE — Therapy (Addendum)
OUTPATIENT PHYSICAL THERAPY TREATMENT NOTE   Patient Name: [name] MRN: [id number] DOB:[birth year], 46 y.o., female Today's Date: 06/28/2022  PCP: Jinny Sanders, MD REFERRING PROVIDER: Yetta Flock, MD  END OF SESSION:   PT End of Session - 06/28/22 0938     Visit Number 2    Date for PT Re-Evaluation 07/23/22    Authorization Type [employer]    Authorization - Visit Number 2    Authorization - Number of Visits 90    PT Start Time 0930    PT Stop Time 1015    PT Time Calculation (min) 45 min    Activity Tolerance Patient tolerated treatment well    Behavior During Therapy [facility] for tasks assessed/performed             Past Medical History:  Diagnosis Date   Anxiety    Asthma    Chest pain    a. s/p intermediate risk nuclear stress test on 09/23/14 and normal LHC on 09/24/14    Chronic nausea    Colonic inertia    Crohn disease ([place]) 2000   Diverticulitis    Dysautonomia ([place])    recurrent syncope s/p ILR by Dr Doreatha Lew   Endometriosis    Essential hypertension 03/17/2017   a - Renal artery [name] 3/18: normal     GERD (gastroesophageal reflux disease)    H/O hiatal hernia    Migraine    Palpitations    PONV (postoperative nausea and vomiting)    Pulmonary embolism affecting pregnancy    Vertigo    Past Surgical History:  Procedure Laterality Date   27 HOUR [place] STUDY N/A 12/31/2016   Procedure: 24 HOUR [place] STUDY;  Surgeon: Manus Gunning, MD;  Location: [facility];  Service: Gastroenterology;  Laterality: N/A;   ANTERIOR CERVICAL DECOMP/DISCECTOMY FUSION N/A 07/19/2017   Procedure: Cervical five-six Anterior cervical decompression/discectomy/fusion;  Surgeon: Erline Levine, MD;  Location: [place];  Service: Neurosurgery;  Laterality: N/A;  C5-6 Anterior cervical decompression/discectomy/fusion   APPENDECTOMY  ~ [place]  2011   emergency  CS due to placental abruption   CESAREAN SECTION  2014   "preeclampsia"    COLON SURGERY     CYSTOSCOPY W/ STONE MANIPULATION  X 2   ENDOSCOPIC RELEASE TRANSVERSE CARPAL LIGAMENT OF HAND Right 12/2010   ESOPHAGEAL MANOMETRY N/A 12/31/2016   Procedure: ESOPHAGEAL MANOMETRY (EM);  Surgeon: Manus Gunning, MD;  Location: [facility];  Service: Gastroenterology;  Laterality: N/A;   GASTROCNEMIUS RECESSION Right 07/08/2020   Procedure: GASTROCNEMIUS SLIDE;  Surgeon: [name], MD;  Location: [place];  Service: Orthopedics;  Laterality: Right;   HARDWARE REMOVAL Right 07/08/2020   Procedure: HARDWARE REMOVAL;  Surgeon: [name], MD;  Location: [place];  Service: Orthopedics;  Laterality: Right;   ILEOCECETOMY  2002 X 2   KNEE ARTHROSCOPY Bilateral 1988-1990   LAPAROSCOPY FOR ECTOPIC PREGNANCY  11/2012   LEFT HEART CATHETERIZATION WITH CORONARY ANGIOGRAM N/A 09/24/2014   Procedure: LEFT HEART CATHETERIZATION WITH CORONARY ANGIOGRAM;  Surgeon: Leonie Man, MD;  Location: [facility] LAB;  Service: Cardiovascular;  Laterality: N/A;   LOOP RECORDER IMPLANT  11/2009   by DR Doreatha Lew   LOOP RECORDER REMOVAL N/A 07/05/2017   Procedure: Loop Recorder Removal;  Surgeon: Evans Lance, MD;  Location: [place] CV LAB;  Service: Cardiovascular;  Laterality: N/A;   NASAL SINUS SURGERY  ~  2007   OPEN REDUCTION INTERNAL FIXATION (ORIF) FOOT LISFRANC FRACTURE Right 07/18/2018   Procedure: OPEN REDUCTION INTERNAL FIXATION (ORIF) RIGHT TARSAL METATARSAL FRACTURE and DEPO INJECTION;  Surgeon: [name], MD;  Location: [place];  Service: Orthopedics;  Laterality: Right;   PARTIAL COLECTOMY  2002   partial removed due to crohns   TONSILLECTOMY  ~ 1996   US ECHOCARDIOGRAPHY  11/11/2009   EF 55-60%   Patient Active Problem List   Diagnosis Date Noted   Iron deficiency anemia due to chronic blood loss 06/14/2022   Herniated cervical disc 07/19/2017   Essential hypertension 03/17/2017   Fluctuating  blood pressure 02/28/2017   Immunosuppressed status ([place]) 03/27/2016   High risk medication use 07/19/2015   Long-term (current) use of anticoagulants 07/19/2015   Depression, major, in remission ([place]) 06/10/2015   History of hyperemesis gravidarum 11/08/2014   Dysautonomia ([place])    Palpitations    HLD (hyperlipidemia) 09/23/2014   Elevated LFTs 10/21/2012   Generalized anxiety disorder 10/06/2012   BPPV (benign paroxysmal positional vertigo) 03/15/2012   Crohn's disease ([place]) 07/27/2011   Reactive arthritis due to crohn's flares 07/27/2011   Asthma, moderate persistent 07/27/2011   Gastroesophageal reflux disease without esophagitis 07/27/2011   Allergic rhinitis 07/27/2011   Endometriosis 07/27/2011   Migraine 07/27/2011   Calcium nephrolithiasis 07/27/2011   Syncope 11/10/2010  REFERRING DIAG:  K50.918 (ICD-10-CM) - Crohn's disease with other complication, unspecified gastrointestinal tract location ([place]) K59.00 (ICD-10-CM) - Constipation, unspecified constipation type K62.5 (ICD-10-CM) - Rectal bleeding R10.9 (ICD-10-CM) - Abdominal pain, unspecified abdominal location K80.50 (ERX-54-MG) - Biliary colic Q67.6 (PPJ-09-TO) - Rectal prolapse   THERAPY DIAG:  Muscle weakness (generalized)   Posterior vaginal wall prolapse   Prolapse of anterior vaginal wall   Rationale for Evaluation and Treatment Rehabilitation   ONSET DATE: 11/2021   SUBJECTIVE:                                                                                                                                                                                            SUBJECTIVE STATEMENT: I had a few cramps after the initial eval.     PAIN:  Are you having pain? Yes NPRS scale: 8/10 high and low is 3/10 Pain location:  upper right quadrant   Pain type: feels like a ball with sharp claws on it Pain description: constant    Aggravating factors: after she eats Relieving factors: heating  pad PRECAUTIONS: None   WEIGHT BEARING RESTRICTIONS No   FALLS:  Has patient fallen in last 6 months? No   LIVING ENVIRONMENT: Lives with: lives with their  family     OCCUPATION: Pharmacist, hospital,    PLOF: Independent   PATIENT GOALS avoid surgery for the prolapse, be able to sleep through the night without urinating   PERTINENT HISTORY:  Endometriosis, Crohn disease, c-section x2, iliocecetomy 2002x2     BOWEL MOVEMENT Pain with bowel movement: No Type of bowel movement:Type (Bristol Stool Scale) Type 5, Frequency every other day with medication, Strain Yes, and Splinting if the stool is solid Fully empty rectum: Yes:   Leakage: No Pads: No Fiber supplement: No     PROLAPSE Cystocele   and Rectocele         OBJECTIVE:    DIAGNOSTIC FINDINGS:  none   PATIENT SURVEYS:  none   PFIQ-7 none   COGNITION:            Overall cognitive status: Within functional limits for tasks assessed                          SENSATION:            Light touch: Appears intact            Proprioception: Appears intact                    POSTURE: No Significant postural limitations               PELVIC ALIGNMENT:   LUMBARAROM/PROM;  Full     LOWER EXTREMITY ROM:   Passive ROM Right eval Left eval  Hip abduction 4/5 3+/5  Hip adduction 4/5 4/5   (Blank rows = not tested)   LOWER EXTREMITY MMT:   MMT Right eval Left eval  Hip internal rotation 20 30  Hip external rotation 55 60     PALPATION:   General  right abdominal has soreness. Good mobility of abdominal scar, tightness on the right side of the umbilicus.                  External Perineal Exam good                             Internal Pelvic Floor tightness on the right pelvic floor muscles   Patient confirms identification and approves PT to assess internal pelvic floor and treatment Yes   PELVIC MMT:   MMT eval  Vaginal 3/5 after manual work to the right pelvic floor and introitus  Internal Anal  Sphincter    External Anal Sphincter    Puborectalis    Diastasis Recti    (Blank rows = not tested)         TONE: good   PROLAPSE: Anterior and posterior wall weakness to the introitus   TODAY'S TREATMENT  06/29/2022 Neuromuscular re-education: Pelvic floor contraction training:ball squeeze with legs elevated holding for 5 sec 10x with abdominal contraction   Bridge with ball squeeze 10x   Hip flexion isometric holding for 5 sec 5 time seach side   Hip supine clamshell with red band 20x   Quadruped transverse abdominus contraction   Quadruped cat cow  Therapeutic activities: Functional strengthening activities:Educated patient on how to hinge at her hips in sitting and lifting, using her legs with lifting, not twisting her trunk, breath out on the heaviest part, and keeping the distance between the rib cage and pubic bone.  Self-care: Educated patient on ways to reduce leg swelling with wearing compression hose, lymph drainage  techniques.     EVAL Date:  HEP established-see below  Education on what a prolapse is and how it affects the pelvic floor   PATIENT EDUCATION:  06/29/2022 Education details: Access Code: [email or web address], body mechanics with lifting, lymph drainage techniques Person educated: Patient Education method: Explanation, Demonstration, Tactile cues, Verbal cues, and Handouts Education comprehension: verbalized understanding, returned demonstration, verbal cues required, tactile cues required, and needs further education     HOME EXERCISE PROGRAM: 06/29/2022  Access Code: [email or web address] URL: [email or web address] Date: 06/28/2022 Prepared by: Earlie Counts  Program Notes [facility]  Exercises - Supine Hip Adduction Isometric with Diona Foley  - 1 x daily - 7 x weekly - 1 sets - 10 reps - 5 sec hold - Hooklying Isometric Hip Flexion  - 1 x daily - 7 x weekly - 1 sets - 5 reps - 5 sec hold - Supine Bridge with Mini Swiss Ball Between Knees  - 1 x daily - 7 x  weekly - 1 sets - 10 reps - Hooklying Clamshell with Resistance  - 1 x daily - 7 x weekly - 2 sets - 10 reps - Quadruped Cat Cow  - 1 x daily - 7 x weekly - 1 sets - 10 reps - Quadruped Transversus Abdominis Bracing  - 1 x daily - 7 x weekly - 1 sets - 10 reps ASSESSMENT:   CLINICAL IMPRESSION: Patient is a 46 y.o. female  who was seen today for physical therapy  treatment for anterior and posterior prolapse. Patient is learning how to manage her prolapse with correct lifting and breath. She has learned exercises in supine to help reduce the prolapse. Patient understands ways to reduce lower leg swelling. Patient would benefit from skilled therapy to work on prolapse management, pelvic floor strengthening, and core strength.      OBJECTIVE IMPAIRMENTS decreased activity tolerance and decreased coordination. Decreased strength   ACTIVITY LIMITATIONS toileting   PARTICIPATION LIMITATIONS: shopping and community activity   PERSONAL FACTORS Endometriosis, Crohn disease, c-section x2, iliocecetomy 2002x2 are also affecting patient's functional outcome.    REHAB POTENTIAL: Good   CLINICAL DECISION MAKING: Evolving/moderate complexity   EVALUATION COMPLEXITY: Moderate     GOALS: Goals reviewed with patient? Yes   SHORT TERM GOALS: Target date:  less than  4 weeks so no STG's     LONG TERM GOALS: Target date: 07/23/2022    Patient understands how to manage prolapse to reduce in supine.  Baseline:  Goal status: INITIAL   2.  Pelvic floor contraction is 3/5 with no abdominal bulging.  Baseline:  Goal status: INITIAL   3.  Patient understands pressure management to reduce strain on the prolapse.  Baseline:  Goal status: INITIAL   4.  Patient understands correct body mechanics with home task to reduce strain on prolapse.  Baseline:  Goal status: INITIAL   5.  Patient is independent with pelvic floor and core strength.  Baseline:  Goal status: INITIAL     PLAN: PT FREQUENCY:  1-2x/week   PT DURATION: 4 weeks   PLANNED INTERVENTIONS: Therapeutic exercises, Therapeutic activity, Neuromuscular re-education, Patient/Family education, Biofeedback, and Manual therapy   PLAN FOR NEXT SESSION: See what her scans say, continue to do quadruped lift extremity, supine abdominal contraction    Earlie Counts, PT 06/28/22 10:31 AM PHYSICAL THERAPY DISCHARGE SUMMARY  Visits from Start of Care: 2  Current functional level related to goals / functional outcomes: Patient has returned to [place].   Remaining deficits: See above.  Education / Equipment: HEP   Patient agrees to discharge. Patient goals were not met. Patient is being discharged due to  returning to [place] where she resides.Thank you for the referral. Earlie Counts, PT 08/30/22 8:39 AM

## 2022-06-29 ENCOUNTER — Ambulatory Visit: Payer: 59

## 2022-06-29 ENCOUNTER — Telehealth: Payer: Self-pay

## 2022-06-29 DIAGNOSIS — R109 Unspecified abdominal pain: Secondary | ICD-10-CM

## 2022-06-29 DIAGNOSIS — K50918 Crohn's disease, unspecified, with other complication: Secondary | ICD-10-CM

## 2022-06-29 DIAGNOSIS — D500 Iron deficiency anemia secondary to blood loss (chronic): Secondary | ICD-10-CM

## 2022-06-29 NOTE — Telephone Encounter (Signed)
Patient sent a MyChart on 7/6: Dr Havery Moros after talking to my husband we would love if your partners could fit me in to their schedules on the 13th or preferably the 14th and we would just leave our family trip a little early due to our tight time schedule here. Then they can discuss with you what they see and we can discuss treatment options before we have to fly back on the 26th. The 17th is also an option if you have a cancellation. I'm just too uncomfortable to wait for my own selfish reasons. 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 After talking with your team, let me know what you decide please and thank you for everything.    [name]

## 2022-06-29 NOTE — Telephone Encounter (Signed)
Dr. Henrene Pastor had availability in the [facility] on 07/05/22. Dr. Havery Moros checked with Dr. Henrene Pastor and he is able to assist with patient's procedures.   I called and spoke with patient to schedule her EGD/colon in the [facility]. Pt has been scheduled for EGD/colon on Thursday, 07/05/22 at 1:30 pm. Pt is aware that she will need to arrive in the [facility] by 12:30 pm with a care partner. Pt would like SUTAB sent to [facility] on [address], she is aware that she can use Miralax prep also if needed. Pt is aware that I will send her instructions to her MyChart. Pt also wanted to know what other biologic therapies may be recommended so that she can check with her insurance about coverage. I told her that I will send a message to Dr. Havery Moros and would let her know. Pt verbalized understanding and had no concerns at the end of the call.   Ambulatory referral to GI in epic. SUTAB not on formulary. SUTAB sample provided to patient.  EGD/COLON instructions sent to patient via [place] and a copy was printed and placed with sample at 2nd floor receptionist desk. Pt notified.

## 2022-07-02 ENCOUNTER — Encounter (HOSPITAL_COMMUNITY): Admission: RE | Payer: Self-pay | Source: Home / Self Care

## 2022-07-02 ENCOUNTER — Ambulatory Visit (HOSPITAL_COMMUNITY): Admission: RE | Admit: 2022-07-02 | Payer: 59 | Source: Home / Self Care | Admitting: Surgery

## 2022-07-02 ENCOUNTER — Ambulatory Visit: Payer: 59 | Admitting: Physical Therapy

## 2022-07-02 SURGERY — LAPAROSCOPIC CHOLECYSTECTOMY
Anesthesia: General

## 2022-07-03 ENCOUNTER — Telehealth: Payer: 59 | Admitting: Family Medicine

## 2022-07-04 ENCOUNTER — Ambulatory Visit: Payer: 59 | Admitting: Physical Therapy

## 2022-07-05 ENCOUNTER — Encounter: Payer: Self-pay | Admitting: Internal Medicine

## 2022-07-05 ENCOUNTER — Ambulatory Visit (AMBULATORY_SURGERY_CENTER): Payer: 59 | Admitting: Internal Medicine

## 2022-07-05 VITALS — BP 101/58 | HR 80 | Temp 98.6°F | Resp 10 | Ht 64.0 in | Wt 151.0 lb

## 2022-07-05 DIAGNOSIS — K449 Diaphragmatic hernia without obstruction or gangrene: Secondary | ICD-10-CM

## 2022-07-05 DIAGNOSIS — K317 Polyp of stomach and duodenum: Secondary | ICD-10-CM

## 2022-07-05 DIAGNOSIS — K625 Hemorrhage of anus and rectum: Secondary | ICD-10-CM

## 2022-07-05 DIAGNOSIS — K50918 Crohn's disease, unspecified, with other complication: Secondary | ICD-10-CM

## 2022-07-05 DIAGNOSIS — R110 Nausea: Secondary | ICD-10-CM

## 2022-07-05 DIAGNOSIS — K219 Gastro-esophageal reflux disease without esophagitis: Secondary | ICD-10-CM | POA: Diagnosis not present

## 2022-07-05 DIAGNOSIS — R933 Abnormal findings on diagnostic imaging of other parts of digestive tract: Secondary | ICD-10-CM

## 2022-07-05 DIAGNOSIS — K5900 Constipation, unspecified: Secondary | ICD-10-CM

## 2022-07-05 DIAGNOSIS — D509 Iron deficiency anemia, unspecified: Secondary | ICD-10-CM

## 2022-07-05 DIAGNOSIS — K648 Other hemorrhoids: Secondary | ICD-10-CM | POA: Diagnosis not present

## 2022-07-05 DIAGNOSIS — R109 Unspecified abdominal pain: Secondary | ICD-10-CM

## 2022-07-05 HISTORY — PX: COLONOSCOPY: SHX174

## 2022-07-05 HISTORY — PX: UPPER GI ENDOSCOPY: SHX6162

## 2022-07-05 MED ORDER — SODIUM CHLORIDE 0.9 % IV SOLN: 4.0000 mg | Freq: Once | INTRAVENOUS | Status: DC

## 2022-07-05 MED ORDER — SODIUM CHLORIDE 0.9 % IV SOLN: 500.0000 mL | Freq: Once | INTRAVENOUS | Status: DC

## 2022-07-05 NOTE — Progress Notes (Signed)
Pt with laryngospasm at end of EGD, ambu  bag utilized to brake spasm and support respirations, pt now awake with gd SR's, O2 sat's 98-100, VSS. Dr. Henrene Pastor in room during event and gave  assistance

## 2022-07-05 NOTE — Op Note (Signed)
[facility] Patient Name: [name] Procedure Date: 07/05/2022 10:22 AM MRN: [id number] Endoscopist: Docia Chuck. Henrene Pastor , MD Age: 46 Referring MD:  Date of Birth: May 15, 1976 Gender: Female Account #: [id number] Procedure:                Upper GI endoscopy Indications:              Abdominal pain, Iron deficiency anemia Medicines:                Monitored Anesthesia Care Procedure:                Pre-Anesthesia Assessment:                           - Prior to the procedure, a History and Physical                            was performed, and patient medications and                            allergies were reviewed. The patient's tolerance of                            previous anesthesia was also reviewed. The risks                            and benefits of the procedure and the sedation                            options and risks were discussed with the patient.                            All questions were answered, and informed consent                            was obtained. Prior Anticoagulants: The patient has                            taken no previous anticoagulant or antiplatelet                            agents. ASA Grade Assessment: II - A patient with                            mild systemic disease. After reviewing the risks                            and benefits, the patient was deemed in                            satisfactory condition to undergo the procedure.                           After obtaining informed consent, the endoscope was  passed under direct vision. Throughout the                            procedure, the patient's blood pressure, pulse, and                            oxygen saturations were monitored continuously. The                            GIF [id number] #[id number] was introduced through the                            mouth, and advanced to the third part of duodenum.                            The upper GI  endoscopy was accomplished without                            difficulty. The patient tolerated the procedure                            well. Scope In: Scope Out: Findings:                 The esophagus was normal.                           The stomach revealed a small sliding hiatal hernia                            and benign fundic gland type polyps.                           The examined duodenum was normal to the third                            portion.                           The cardia and gastric fundus were normal on                            retroflexion. Complications:            No immediate complications. Estimated Blood Loss:     Estimated blood loss: none. Impression:               1. Incidental benign fundic gland polyps. Otherwise                            normal EGD.                           2. GERD.                           3. Chronic constipation  4. Symptomatic and bleeding hemorrhoids                           5. Iron deficiency anemia likely secondary to                            hemorrhoids Recommendation:           1. Patient has a contact number available for                            emergencies. The signs and symptoms of potential                            delayed complications were discussed with the                            patient. Return to normal activities tomorrow.                            Written discharge instructions were provided to the                            patient.                           2. Resume previous diet.                           3. Continue present medications.                           4. Treatment of hemorrhoids per Dr. Havery Moros. May                            benefit from an office banding. Literature on the                            procedure provided to the patient                           5. Treatment of constipation per Dr. Havery Moros                           6. Iron  replacement therapy per Dr. Soyla Murphy. Henrene Pastor, MD 07/05/2022 11:24:19 AM This report has been signed electronically.

## 2022-07-05 NOTE — Progress Notes (Signed)
   PT C/O Nausea after adm to recovery rm. Zofran 4 mg iv given by Andreas Ohm as ordered by Dr Henrene Pastor. After 5 minutes pt denied nausea.

## 2022-07-05 NOTE — Progress Notes (Signed)
A/ox3, pleased with MAC, report to RN 

## 2022-07-05 NOTE — Progress Notes (Signed)
Pt's states no medical or surgical changes since previsit or office visit. 

## 2022-07-05 NOTE — Patient Instructions (Signed)
TREATMENT OF HEMORRHOIDS ,TREATMENT OF CONSTIPATION, & IRON REPLACEMENT THERAPY BY DR Havery Moros.  RESUME USUAL DIET AND MEDICATIONS   HANDOUT ON HEMORRHOIDS GIVEN TO YOU TODAY   YOU HAD AN ENDOSCOPIC PROCEDURE TODAY AT [facility]:   Refer to the procedure report that was given to you for any specific questions about what was found during the examination.  If the procedure report does not answer your questions, please call your gastroenterologist to clarify.  If you requested that your care partner not be given the details of your procedure findings, then the procedure report has been included in a sealed envelope for you to review at your convenience later.  YOU SHOULD EXPECT: Some feelings of bloating in the abdomen. Passage of more gas than usual.  Walking can help get rid of the air that was put into your GI tract during the procedure and reduce the bloating. If you had a lower endoscopy (such as a colonoscopy or flexible sigmoidoscopy) you may notice spotting of blood in your stool or on the toilet paper. If you underwent a bowel prep for your procedure, you may not have a normal bowel movement for a few days.  Please Note:  You might notice some irritation and congestion in your nose or some drainage.  This is from the oxygen used during your procedure.  There is no need for concern and it should clear up in a day or so.  SYMPTOMS TO REPORT IMMEDIATELY:  Following lower endoscopy (colonoscopy or flexible sigmoidoscopy):  Excessive amounts of blood in the stool  Significant tenderness or worsening of abdominal pains  Swelling of the abdomen that is new, acute  Fever of 100F or higher  Following upper endoscopy (EGD)  Vomiting of blood or coffee ground material  New chest pain or pain under the shoulder blades  Painful or persistently difficult swallowing  New shortness of breath  Fever of 100F or higher  Black, tarry-looking stools  For urgent or emergent  issues, a gastroenterologist can be reached at any hour by calling [phone]. Do not use MyChart messaging for urgent concerns.    DIET:  We do recommend a small meal at first, but then you may proceed to your regular diet.  Drink plenty of fluids but you should avoid alcoholic beverages for 24 hours.  ACTIVITY:  You should plan to take it easy for the rest of today and you should NOT DRIVE or use heavy machinery until tomorrow (because of the sedation medicines used during the test).    FOLLOW UP: Our staff will call the number listed on your records the next business day following your procedure.  We will call around 7:15- 8:00 am to check on you and address any questions or concerns that you may have regarding the information given to you following your procedure. If we do not reach you, we will leave a message.  If you develop any symptoms (ie: fever, flu-like symptoms, shortness of breath, cough etc.) before then, please call [phone].  If you test positive for Covid 19 in the 2 weeks post procedure, please call and report this information to [name].    If any biopsies were taken you will be contacted by phone or by letter within the next 1-3 weeks.  Please call us at [phone] if you have not heard about the biopsies in 3 weeks.    SIGNATURES/CONFIDENTIALITY: You and/or your care partner have signed paperwork which will be entered into your electronic  medical record.  These signatures attest to the fact that that the information above on your After Visit Summary has been reviewed and is understood.  Full responsibility of the confidentiality of this discharge information lies with you and/or your care-partner.

## 2022-07-05 NOTE — Progress Notes (Signed)
HPI :  CROHNS HISTORY 46 y/o female with ileocolonic stricturing / perforating Crohn's disease   Ileocolonic Crohns - diagnosed in 2002. She reported she presented with a bowel obstruction with perforation and had surgery upon diagnosis and had 2 surgeries within the first year of diagnosis. She reports a history of abscess formation and fistula development (enteroenteral fistulas?). She was eventually placed on Remicade, only for a short period of time, perhaps 1-2 infusions - she was uncertain if it worked or not, but stopped it for unclear reasons. She had been on 6MP or steroids over time otherwise, she states she has intermittently been on 6MP. She has been on mesalamines in the past which did not help. She has been treated for bacterial overgrowth over time given ileocecectomy. She has had 2 surgeries total for her Crohns. She has been previously treated with Humira which appeared to put her disease into remission, however developed severe arthralgias, evaluated by Rheumatology who thought it was related to Humira, which was stopped and her symptoms resolved. She was then given a dose of Remicade and developed a serum sickness which led to hospital admission, treated with steroids.    She otherwise has a history of uveitis and iritis with Cronhns flares. She is seeing opthalmology in [facility] for this. She reports she also has significant back pain and arthralgias due to Crohns flares. She takes tramadol for pain, does not NSAIDs.   Her pregnancy was complicated by PE x 2. Was on coumadin but now off all anticoagulants. She has been on IV iron in the past.  She has been on Entyvio since June 2017.     SINCE THE LAST VISIT:   46 year old female here to reestablish care for multiple issues.  I have not seen her since February 2018.  She moved to [place] with her husband who is principal of the school at an [employer] in [place].  She has been receiving her care there since that time.  She has  remained on Entyvio infusions every 6 weeks since then.  She was previously on MRN when I saw her in the past but she is no longer on that.   She has had multiple problems with her GI tract since have last seen her.  We spent several minutes today discussing her case and reviewing her records to catch up on her medical history.  Essentially for the past few years she is done quite poorly.  She has recurrent right upper quadrant pain that radiates to her back and shoulder after she eats.  She has not been eating well due to this and has fluctuations in how much the symptoms bother her.  She has been told she has gallstones and sludge in her gallbladder based on imaging there but they would not do surgery to remove her gallbladder because she has a history of Crohn's disease.  She is been placed on ursodiol for the past 3 years and she states this has not really been helping of her symptoms which continue to bother her.  Sometimes she will have nausea and vomiting with this.   Separately she has had episodes of severe bowel dysfunction.  She can often have normal bowel movements and go every day for a period of time and then will have no bowel movements for days upwards of a week or 2 at a time.  When this happens she states she gets severe pain and discomfort in her abdomen.  She has been hospitalized 3 or 4  times since 2020 with severe abdominal pain due to distended colon and she states concern for perforation at the time.  She is treated with multiple laxatives to move her bowel.  She definitely feels better when she is moving her bowels more regularly, although it does not alleviate any of her right upper quadrant pain.  She still has some pain in her mid abdomen and lower abdomen at times.  More recently she has not had bowel movement for about the past week.  She has passing gas.  She feels distended and quite uncomfortable.  I reviewed her regimen with her which includes Motegrity 2 mg daily, MiraLAX as  needed, she takes multiple doses per day, also taking senna supplements.  She is taking Prevacid for reflux which works pretty well for her.  She has frequent rectal bleeding due to internal hemorrhoids.  She also complains of bladder and rectal prolapse.  She has had significant bleeding in the past and this has been going on for some time now.   She states she had an extensive work-up with the [place] physicians to include endoscopy, colonoscopy, multiple scans.  She last had a colonoscopy in October 2022 stating her Crohn's was in remission, no mucosal inflammation, no stricture at the surgical anastomosis.  She has been told she has dysmotility of her right colon, where stool becomes stationary there and is the cause of her problems.  She was seen by a surgeon and told she might need a colectomy.  She has deferred any surgeries while in [place].   She is tearful in the office today, generally has been feeling poorly for a few years now and she reports they state her Crohn's is well controlled and has continued her on the [place].  Her last dose of this was in mid May or so.   She flew to the [place] states and will be here for 6 weeks for further evaluation of these issues and hopefully to get feeling better before she returns to [place].  She has used bowel preps in the past but she eats nothing but "bloody water" comes out and often does not help produce much stool.  She has used enemas in the past which does not help her go, only produces more blood.  She denies any NSAID use.  She has not tried any other bowel regimens lately other than what was previously discussed.  She last had a CT scan in January of this year.  She brings discs of images with her today as well as that of a deaf echography study, I cannot visualize those on the computer today.  For her prolapse issue she is never seen pelvic floor physical therapy.  It does not sound like she has tried any antispasmodics such as Bentyl or Levsin etc.      Prior workup here: EGD 12/14/16 - 1cm hiatal hernia, otherwise normal exam, benign stomach polyps fundic gland Colonoscopy 12/14/16 - normal colon and ileum, few small ulcers at anastomosis - normal colon bx Esophageal manometry 12/2016 - normal 24 HR pH impedance testing - Demeester score of 4.1, 41 symptoms reported, symptom index of 12% for reflux     Colonoscopy 09/2021 - [place] - reported Crohn's in remission - widely patent anastomosis and normal bowel?   MRE enterography 10/28/21 - [place] no dilated loops of small bowel or pathologic abnormalities         Past Medical History:  Diagnosis Date   Anxiety     Asthma  Chest pain      a. s/p intermediate risk nuclear stress test on 09/23/14 and normal LHC on 09/24/14    Chronic nausea     Colonic inertia     Crohn disease ([place]) 2000   Diverticulitis     Dysautonomia ([place])      recurrent syncope s/p ILR by Dr Doreatha Lew   Endometriosis     Essential hypertension 03/17/2017    a - Renal artery [name] 3/18: normal     GERD (gastroesophageal reflux disease)     H/O hiatal hernia     Migraine     Palpitations     PONV (postoperative nausea and vomiting)     Pulmonary embolism affecting pregnancy     Vertigo               Past Surgical History:  Procedure Laterality Date   46 HOUR [place] STUDY N/A 12/31/2016    Procedure: 24 HOUR [place] STUDY;  Surgeon: Manus Gunning, MD;  Location: [facility];  Service: Gastroenterology;  Laterality: N/A;   ANTERIOR CERVICAL DECOMP/DISCECTOMY FUSION N/A 07/19/2017    Procedure: Cervical five-six Anterior cervical decompression/discectomy/fusion;  Surgeon: Erline Levine, MD;  Location: [facility];  Service: Neurosurgery;  Laterality: N/A;  C5-6 Anterior cervical decompression/discectomy/fusion   APPENDECTOMY   ~ [place]   2011    emergency  CS due to placental abruption   CESAREAN SECTION   2014    "preeclampsia"   COLON SURGERY       CYSTOSCOPY W/  STONE MANIPULATION   X 2   ENDOSCOPIC RELEASE TRANSVERSE CARPAL LIGAMENT OF HAND Right 12/2010   ESOPHAGEAL MANOMETRY N/A 12/31/2016    Procedure: ESOPHAGEAL MANOMETRY (EM);  Surgeon: Manus Gunning, MD;  Location: [facility];  Service: Gastroenterology;  Laterality: N/A;   GASTROCNEMIUS RECESSION Right 07/08/2020    Procedure: GASTROCNEMIUS SLIDE;  Surgeon: [name], MD;  Location: [facility];  Service: Orthopedics;  Laterality: Right;   HARDWARE REMOVAL Right 07/08/2020    Procedure: HARDWARE REMOVAL;  Surgeon: [name], MD;  Location: [facility];  Service: Orthopedics;  Laterality: Right;   ILEOCECETOMY   2002 X 2   KNEE ARTHROSCOPY Bilateral 1988-1990   LAPAROSCOPY FOR ECTOPIC PREGNANCY   11/2012   LEFT HEART CATHETERIZATION WITH CORONARY ANGIOGRAM N/A 09/24/2014    Procedure: LEFT HEART CATHETERIZATION WITH CORONARY ANGIOGRAM;  Surgeon: Leonie Man, MD;  Location: [facility];  Service: Cardiovascular;  Laterality: N/A;   LOOP RECORDER IMPLANT   11/2009    by DR Doreatha Lew   LOOP RECORDER REMOVAL N/A 07/05/2017    Procedure: Loop Recorder Removal;  Surgeon: Evans Lance, MD;  Location: [facility];  Service: Cardiovascular;  Laterality: N/A;   NASAL SINUS SURGERY   ~ 2007   OPEN REDUCTION INTERNAL FIXATION (ORIF) FOOT LISFRANC FRACTURE Right 07/18/2018    Procedure: OPEN REDUCTION INTERNAL FIXATION (ORIF) RIGHT TARSAL METATARSAL FRACTURE and DEPO INJECTION;  Surgeon: [name], MD;  Location: [facility];  Service: Orthopedics;  Laterality: Right;   PARTIAL COLECTOMY   2002    partial removed due to crohns   TONSILLECTOMY   ~ 1996   US ECHOCARDIOGRAPHY   11/11/2009    EF 55-60%         Family History  Problem Relation Age of Onset   Hypertension Mother     Hyperlipidemia  Mother     Arthritis Mother     Diabetes Father     Coronary artery disease Father     Heart disease Father 34        MI age  67   Hyperlipidemia Brother     Hypertension Brother     Colon cancer Paternal Grandmother      Social History         Tobacco Use   Smoking status: Never   Smokeless tobacco: Never  Vaping Use   Vaping Use: Never used  Substance Use Topics   Alcohol use: Yes      Alcohol/week: 1.0 standard drink of alcohol      Types: 1 Glasses of wine per week      Comment: 1 glass per week   Drug use: No          Current Outpatient Medications  Medication Sig Dispense Refill   ALPRAZolam (XANAX) 0.25 MG tablet TAKE 2 TABLETS BY MOUTH EVERY DAY AS NEEDED 180 tablet 1   amLODipine-benazepril (LOTREL) 5-10 MG capsule Take 1 capsule by mouth daily.       butalbital-acetaminophen-caffeine (FIORICET, ESGIC) 50-325-40 MG tablet TAKE TWO CAPSULES BY MOUTH EVERY 6 HOURS AS NEEDED FOR MIGRAINES 60 tablet 1   cetirizine (ZYRTEC) 10 MG tablet Take 1 tablet (10 mg total) by mouth at bedtime. 90 tablet 3   DULoxetine (CYMBALTA) 30 MG capsule Take 30 mg by mouth daily.       linaclotide (LINZESS) 290 MCG CAPS capsule Take 1 capsule (290 mcg total) by mouth daily before breakfast. 30 capsule 0   montelukast (SINGULAIR) 10 MG tablet Take 1 tablet (10 mg total) by mouth at bedtime. 90 tablet 3   ondansetron (ZOFRAN-ODT) 4 MG disintegrating tablet Take 1 tablet (4 mg total) by mouth every 6 (six) hours as needed for nausea or vomiting. 30 tablet 1   zolpidem (AMBIEN) 10 MG tablet Take 1 tablet (10 mg total) by mouth at bedtime. 90 tablet 1    No current facility-administered medications for this visit.         Allergies  Allergen Reactions   Clarithromycin Other (See Comments)      Other reaction(s): Bleeding Cant take due to Chrons disease Other reaction(s): Bleeding Other reaction(s): Bleeding     Metoclopramide Diarrhea and Nausea And Vomiting   Remicade [Infliximab] Other (See Comments)      Serum Sickness  Couldn't walk, open mouth, pain,    Sulfa Antibiotics Hives and Other (See Comments)       Hematemesis; documented while a young child       Sulfamethoxazole Rash and Other (See Comments)      Internal bleeding and kidney infection   Doxycycline Nausea And Vomiting   Nitrofurantoin Nausea And Vomiting      Other reaction(s): GI Upset (intolerance)   Promethazine Other (See Comments)      "muscle twitches"     Ceftin [Cefuroxime Axetil] Other (See Comments)      dizziness   Prochlorperazine Edisylate Nausea And Vomiting        Review of Systems: All systems reviewed and negative except where noted in HPI.      Imaging Results  No results found.     Physical Exam: BP 100/74   Pulse 87   Ht 5' 4"  (1.626 m)   Wt 151 lb (68.5 kg)   BMI 25.92 kg/m  Constitutional: Pleasant,well-developed, female  HEENT: Normocephalic and atraumatic. Conjunctivae are normal. No  scleral icterus. Neck supple.  Cardiovascular: Normal rate, regular rhythm.  Pulmonary/chest: Effort normal and breath sounds normal. No wheezing, rales or rhonchi. Abdominal: Soft, mildly distended, tenderness to palpation in RUQ and mid abdomen. There are no masses palpable.  DRE / [place] standby - no fissure, inflamed internal hemorrhoids, no mass lesions.  Normal resting tone, normal descent, no overt evidence of dyssynergy. Extremities: no edema Lymphadenopathy: No cervical adenopathy noted. Neurological: Alert and oriented to person place and time. Skin: Skin is warm and dry. No rashes noted. Psychiatric: Normal mood and affect. Behavior is normal.     ASSESSMENT AND PLAN: 46 year old female here to reestablish care for the following:   Crohn's disease Constipation Rectal bleeding Internal hemorrhoids Abdominal pain Biliary colic Rectal prolapse   History as above, history of significant Crohn's disease maintained on Entyvio.  She has been on this regimen and and reportedly Crohn's has been under good control based off prior colonoscopy and radiology studies, however she has  ongoing abdominal pain, and episodic constipation which is severe that has led to hospitalization in the past with concern for possible obstruction?  History taken from the patient and extensive chart review although a lot of the reports are written in [place] in which I cannot interpret.   The question at present time is if Crohn's disease is causing her symptoms or not, or strictures related to that.  Based on the work-up she has had that does not appear to be the case but need to further evaluate to clarify what is driving this.  She has been told she has severe dysmotility or perhaps some adhesive disease in the right abdomen causing her bowel dysfunction.  She has imaging on CDs, I will try to upload these to the radiology server and see if I can review her images with radiology.  I will send her for an acute x-ray today given she is feeling poorly to make sure no obstructive changes.  We will send her to the lab for blood work to make sure stable and no iron deficiency etc. since have last seen her.  We will also check inflammatory markers and a TSH.   For treatment of her constipation, as long as she is not obstructed, we will switch Motegrity to Linzess 290 mcg/day and see if that helps.  She may need to do a MiraLAX bowel prep to purge her bowel and clear her colon if she can tolerate it.  She will also continue high-dose MiraLAX daily if needed.  For her hemorrhoids she can try topical 1% hydrocortisone cream pea-sized amount PR 3 times daily for 1 week or use over-the-counter Calmol 4 suppositories as needed.  I will give her some samples of IBgard to use as needed for bowel spasm see if that helps.  For her rectal prolapse and bladder prolapse and recommending referral to pelvic floor PT, although she has no evidence of pelvic floor dysfunction on exam today.   She otherwise has been told she has gallstones and sludge in her gallbladder.  Has been put on ursodiol for the past 3 years to try to  treat symptoms as opposed to removing her gallbladder, given the surgeons in [place] she was told would not remove her gallbladder if she had Crohn's disease.  She certainly has symptoms that are concerning for biliary colic.  I will refer her to a surgeon at [facility] here to evaluate for possible cholecystectomy.  She is in town for 6 weeks, will  place referral ASAP just to make sure she has enough time to get this done while she is here, if needed.   Otherwise we may need to give her a dose of Entyvio while she is in town in July for her Crohn's disease.  We may need to pursue additional imaging while she is here if we can get the CDs uploaded to our servers, also may need to consider colonoscopy based on the findings and her course to ensure no evidence of anastomotic stricture or active Crohn's disease that could be related to her symptoms.  She understands and agrees with the plan.  I will follow-up with them once I get results of imaging or labs in the near future.   Plan: - lab today CBC, CMET, CRP, TIBC / ferritin, TSH - abdominal x ray 2 view today - will try to upload most recent CT scan from [place] as well as defacography study - referral to [facility] general surgery for biliary colic , consideration for cholecystectomy - Zofran 66m ODT every 6 hours PRN #30 RF1 - stop motegrity - trial of Linzess samples 2964m / day if no evidence of obstruction on imaging - continue Miralax - add topical 1% hydrocortisone cream or Calmol4 suppositories PRN for hemorrhoids - samples of IB gard PRN - referral to pelvic floor PT for rectal / bladder prolapse - may need to coordinate Entyvio infusion in July prior to her leaving town   Further recommendations pending her course and results of this exam. I spent 90 minutes reviewing her chart, time with patient, ordering studies, and documenting this note.   StJolly MangoMD [facility]  Case discussed with Dr. ArHavery Moros Above recent H&P  reviewed.  No interval change.  Procedures scheduled with me in Dr. ArDoyne Keel[place] to accommodate the patient's schedule.

## 2022-07-05 NOTE — Op Note (Signed)
[facility] Patient Name: [name] Procedure Date: 07/05/2022 10:23 AM MRN: [id number] Endoscopist: Docia Chuck. Henrene Pastor , MD Age: 46 Referring MD:  Date of Birth: 01-10-1976 Gender: Female Account #: [id number] Procedure:                Colonoscopy Indications:              status post prior ileocecectomy, Rectal bleeding,                            Constipation, iron deficiency anemia, abnormal CT                            abdomen Medicines:                Monitored Anesthesia Care Procedure:                Pre-Anesthesia Assessment:                           - Prior to the procedure, a History and Physical                            was performed, and patient medications and                            allergies were reviewed. The patient's tolerance of                            previous anesthesia was also reviewed. The risks                            and benefits of the procedure and the sedation                            options and risks were discussed with the patient.                            All questions were answered, and informed consent                            was obtained. Prior Anticoagulants: The patient has                            taken no previous anticoagulant or antiplatelet                            agents. ASA Grade Assessment: II - A patient with                            mild systemic disease. After reviewing the risks                            and benefits, the patient was deemed in  satisfactory condition to undergo the procedure.                           After obtaining informed consent, the colonoscope                            was passed under direct vision. Throughout the                            procedure, the patient's blood pressure, pulse, and                            oxygen saturations were monitored continuously. The                            PCF-HQ190L Colonoscope was introduced through the                             anus and advanced to the the cecum, identified by                            appendiceal orifice and ileocecal valve. The                            terminal ileum, ileocecal valve, appendiceal                            orifice, and rectum were photographed. The quality                            of the bowel preparation was excellent. The                            colonoscopy was performed without difficulty. The                            patient tolerated the procedure well. The bowel                            preparation used was SUPREP via split dose                            instruction. Scope In: 10:37:52 AM Scope Out: 10:51:58 AM Scope Withdrawal Time: 0 hours 11 minutes 32 seconds  Total Procedure Duration: 0 hours 14 minutes 6 seconds  Findings:                 There was evidence of prior ileocecectomy. The                            anastomosis was widely patent. The Neo ileum was                            deeply intubated at 20 cm and appeared normal. The  entire colonic mucosa appeared normal. There was no                            evidence for active Crohn's disease or other                            abnormality.                           Noninflamed external hemorrhoids are noted. There                            were internal hemorrhoids, 1 of which was large and                            prolapsed but easily reduced. Others were small.                            There were benign anal papillae. See images. Complications:            No immediate complications. Estimated blood loss:                            None. Estimated Blood Loss:     Estimated blood loss: none. Impression:               - The entire examined colon and Neo ileum were                            normal. No evidence for active Crohn's disease.                           - External and internal hemorrhoids as described.                           -  Iron deficiency anemia likely related to chronic                            bleeding from hemorrhoids. Recommendation:           - Repeat colonoscopy (date not yet determined) for                            surveillance, per Dr. Havery Moros.                           - Patient has a contact number available for                            emergencies. The signs and symptoms of potential                            delayed complications were discussed with the                            patient. Return to  normal activities tomorrow.                            Written discharge instructions were provided to the                            patient.                           - Resume previous diet.                           - Continue present medications.                           - Upper endoscopy today. Please see report                            regarding findings and final recommendations [name] N. Henrene Pastor, MD 07/05/2022 11:13:39 AM This report has been signed electronically.

## 2022-07-06 ENCOUNTER — Telehealth: Payer: Self-pay | Admitting: *Deleted

## 2022-07-06 ENCOUNTER — Ambulatory Visit (INDEPENDENT_AMBULATORY_CARE_PROVIDER_SITE_OTHER): Payer: 59 | Admitting: Family Medicine

## 2022-07-06 ENCOUNTER — Telehealth: Payer: Self-pay

## 2022-07-06 ENCOUNTER — Encounter: Payer: Self-pay | Admitting: Family Medicine

## 2022-07-06 VITALS — BP 108/76 | HR 114 | Temp 98.3°F | Ht 64.0 in | Wt 152.1 lb

## 2022-07-06 DIAGNOSIS — I10 Essential (primary) hypertension: Secondary | ICD-10-CM | POA: Diagnosis not present

## 2022-07-06 DIAGNOSIS — J4540 Moderate persistent asthma, uncomplicated: Secondary | ICD-10-CM

## 2022-07-06 DIAGNOSIS — K50818 Crohn's disease of both small and large intestine with other complication: Secondary | ICD-10-CM | POA: Diagnosis not present

## 2022-07-06 DIAGNOSIS — K219 Gastro-esophageal reflux disease without esophagitis: Secondary | ICD-10-CM

## 2022-07-06 DIAGNOSIS — E785 Hyperlipidemia, unspecified: Secondary | ICD-10-CM

## 2022-07-06 DIAGNOSIS — F411 Generalized anxiety disorder: Secondary | ICD-10-CM

## 2022-07-06 DIAGNOSIS — J45909 Unspecified asthma, uncomplicated: Secondary | ICD-10-CM

## 2022-07-06 DIAGNOSIS — G901 Familial dysautonomia [Riley-Day]: Secondary | ICD-10-CM

## 2022-07-06 DIAGNOSIS — F325 Major depressive disorder, single episode, in full remission: Secondary | ICD-10-CM

## 2022-07-06 DIAGNOSIS — K829 Disease of gallbladder, unspecified: Secondary | ICD-10-CM | POA: Insufficient documentation

## 2022-07-06 MED ORDER — MONTELUKAST SODIUM 10 MG PO TABS: 10.0000 mg | ORAL_TABLET | Freq: Every day | ORAL | 3 refills | Status: DC

## 2022-07-06 MED ORDER — DEXLANSOPRAZOLE 60 MG PO CPDR: 60.0000 mg | DELAYED_RELEASE_CAPSULE | Freq: Every day | ORAL | 3 refills | Status: DC

## 2022-07-06 MED ORDER — DULOXETINE HCL 60 MG PO CPEP
60.0000 mg | ORAL_CAPSULE | Freq: Every day | ORAL | 3 refills | Status: DC
Start: 2022-07-06 — End: 2022-08-13

## 2022-07-06 MED ORDER — DULOXETINE HCL 30 MG PO CPEP: 30.0000 mg | ORAL_CAPSULE | Freq: Every day | ORAL | 3 refills | Status: DC

## 2022-07-06 MED ORDER — METOPROLOL TARTRATE 25 MG PO TABS: 12.5000 mg | ORAL_TABLET | Freq: Two times a day (BID) | ORAL | 1 refills | Status: DC

## 2022-07-06 NOTE — Assessment & Plan Note (Signed)
Chronic, followed by Dr. Havery Moros.  Endoscopy and colonoscopy yesterday.

## 2022-07-06 NOTE — Telephone Encounter (Signed)
  Follow up Call-     07/05/2022    9:42 AM  Call back number  Post procedure Call Back phone  # [phone]  Permission to leave phone message Yes     Patient questions:  Do you have a fever, pain , or abdominal swelling? No. Pain Score  0 *  Have you tolerated food without any problems? Yes.    Have you been able to return to your normal activities? Yes.    Do you have any questions about your discharge instructions: Diet   No. Medications  No. Follow up visit  No.  Do you have questions or concerns about your Care? No.  Actions: * If pain score is 4 or above: No action needed, pain <4.

## 2022-07-06 NOTE — Assessment & Plan Note (Signed)
Overall her depression is well controlled on Cymbalta.

## 2022-07-06 NOTE — Telephone Encounter (Signed)
  Follow up Call-     07/05/2022    9:42 AM  Call back number  Post procedure Call Back phone  # [phone]  Permission to leave phone message Yes     Patient questions:  Do you have a fever, pain , or abdominal swelling? No. Pain Score  0 *  Have you tolerated food without any problems? Yes.    Have you been able to return to your normal activities? Yes.    Do you have any questions about your discharge instructions: Diet   No. Medications  No. Follow up visit  No.  Do you have questions or concerns about your Care? No.  Actions: * If pain score is 4 or above: No action needed, pain <4.

## 2022-07-06 NOTE — Progress Notes (Signed)
Patient ID: [name], female    DOB: Aug 13, 1976, 47 y.o.   MRN: [id number]  This visit was conducted in person.  BP 108/76 (BP Location: Left Arm, Patient Position: Sitting)   Pulse (!) 114   Temp 98.3 F (36.8 C) (Oral)   Ht _0  (1.626 m)   Wt 152 lb 2 oz (69 kg)   SpO2 98%   BMI 26.11 kg/m    CC:  Chief Complaint  Patient presents with   Medication Refill    Subjective:   HPI: [name] is a 46 y.o. female presenting on 07/06/2022 for Medication Refill  She has not been seen since 2018.  She has been living in [place] where her husband is working as a principal of a  school.  Reviewed recent office visit note from GI.  She continues to have issues with Crohn's disease and resulting complications.  She is on Entyvio 300 mg injections every 6 weeks.  They have recently done labs and further evaluation.  Symptoms like due to gallbladder.   She presents today to review her [place] medications and make sure they are correct and without interaction.   She is taking Derusil but is unsure why... this is ursodiol.  She was given this medication after multiple hospital visits for gallbladder pain.  She does not feel it has helped with her symptoms at all.  Hypertension:  Well controlled on amlodipine/benazepril 5/10 mg p.o. daily... in [place] she is taking  perindopril, amlodipine  10/5 mg,   bisoprolol 1.25 mg daily. BP Readings from Last 3 Encounters:  07/06/22 108/76  07/05/22 (!) 101/58  06/21/22 102/66  Using medication without problems or lightheadedness:  Chest pain with exertion: none Edema: on plane flight Short of breath: none Average home BPs: Other issues:  MDD, GAD: PHQ9: 5  GAD7: 13  Having panic attacks occurring daily . Worse with travelling, she has been isolated in foreign country, health issues.  On Cymbalta 60 mg daily, using alprazolam 0.25 mg 2 tablets daily as needed  Using alprazolam prior to work, stressfull    NO SE.  Relevant past  medical, surgical, family and social history reviewed and updated as indicated. Interim medical history since our last visit reviewed. Allergies and medications reviewed and updated. Outpatient Medications Prior to Visit  Medication Sig Dispense Refill   albuterol (VENTOLIN HFA) 108 (90 Base) MCG/ACT inhaler Inhale 2 puffs into the lungs every 6 (six) hours as needed (bronchitis).     ALPRAZolam (XANAX) 0.25 MG tablet TAKE 2 TABLETS BY MOUTH EVERY DAY AS NEEDED (Patient taking differently: Take 0.5 mg by mouth daily as needed for anxiety. TAKE 2 TABLETS BY MOUTH EVERY DAY AS NEEDED anxiety) 180 tablet 1   amLODipine-benazepril (LOTREL) 5-10 MG capsule Take 1 capsule by mouth daily. 90 capsule 0   butalbital-acetaminophen-caffeine (FIORICET, ESGIC) 50-325-40 MG tablet TAKE TWO CAPSULES BY MOUTH EVERY 6 HOURS AS NEEDED FOR MIGRAINES 60 tablet 1   cetirizine (ZYRTEC) 10 MG tablet Take 1 tablet (10 mg total) by mouth at bedtime. 90 tablet 3   dexlansoprazole (DEXILANT) 60 MG capsule Take 60 mg by mouth daily.     DULoxetine (CYMBALTA) 60 MG capsule Take 60 mg by mouth daily.     leuprolide (LUPRON) 11.25 MG injection Inject 11.25 mg into the muscle every 3 (three) months.     linaclotide (LINZESS) 290 MCG CAPS capsule Take 1 capsule (290 mcg total) by mouth daily before breakfast. 30 capsule  2   montelukast (SINGULAIR) 10 MG tablet Take 1 tablet (10 mg total) by mouth at bedtime. 90 tablet 3   vedolizumab (ENTYVIO) 300 MG injection Inject 300 mg into the vein See admin instructions. Every 6 weeks     zolpidem (AMBIEN) 10 MG tablet Take 1 tablet (10 mg total) by mouth at bedtime. 90 tablet 0   DULoxetine (CYMBALTA) 30 MG capsule Take 30 mg by mouth daily. (Patient not taking: Reported on 07/06/2022)     OMEPRAZOLE PO Take 1 capsule by mouth daily. (Patient not taking: Reported on 07/06/2022)     Facility-Administered Medications Prior to Visit  Medication Dose Route Frequency Provider Last Rate Last  Admin   ondansetron (ZOFRAN) 4 mg in sodium chloride 0.9 % 50 mL IVPB  4 mg Intravenous Once Irene Shipper, MD         Per HPI unless specifically indicated in ROS section below Review of Systems  Constitutional:  Negative for fatigue and fever.  HENT:  Negative for congestion.   Eyes:  Negative for pain.  Respiratory:  Negative for cough and shortness of breath.   Cardiovascular:  Negative for chest pain, palpitations and leg swelling.  Gastrointestinal:  Negative for abdominal pain.  Genitourinary:  Negative for dysuria and vaginal bleeding.  Musculoskeletal:  Negative for back pain.  Neurological:  Negative for syncope, light-headedness and headaches.  Psychiatric/Behavioral:  Negative for dysphoric mood.    Objective:  BP 108/76 (BP Location: Left Arm, Patient Position: Sitting)   Pulse (!) 114   Temp 98.3 F (36.8 C) (Oral)   Ht _0  (1.626 m)   Wt 152 lb 2 oz (69 kg)   SpO2 98%   BMI 26.11 kg/m   Wt Readings from Last 3 Encounters:  07/06/22 152 lb 2 oz (69 kg)  07/05/22 151 lb (68.5 kg)  06/21/22 154 lb 9.6 oz (70.1 kg)      Physical Exam Constitutional:      General: She is not in acute distress.    Appearance: Normal appearance. She is well-developed. She is not ill-appearing or toxic-appearing.  HENT:     Head: Normocephalic.     Right Ear: Hearing, tympanic membrane, ear canal and external ear normal. Tympanic membrane is not erythematous, retracted or bulging.     Left Ear: Hearing, tympanic membrane, ear canal and external ear normal. Tympanic membrane is not erythematous, retracted or bulging.     Nose: No mucosal edema or rhinorrhea.     Right Sinus: No maxillary sinus tenderness or frontal sinus tenderness.     Left Sinus: No maxillary sinus tenderness or frontal sinus tenderness.     Mouth/Throat:     Pharynx: Uvula midline.  Eyes:     General: Lids are normal. Lids are everted, no foreign bodies appreciated.     Conjunctiva/sclera: Conjunctivae  normal.     Pupils: Pupils are equal, round, and reactive to light.  Neck:     Thyroid: No thyroid mass or thyromegaly.     Vascular: No carotid bruit.     Trachea: Trachea normal.  Cardiovascular:     Rate and Rhythm: Normal rate and regular rhythm.     Pulses: Normal pulses.     Heart sounds: Normal heart sounds, S1 normal and S2 normal. No murmur heard.    No friction rub. No gallop.  Pulmonary:     Effort: Pulmonary effort is normal. No tachypnea or respiratory distress.     Breath sounds: Normal breath  sounds. No decreased breath sounds, wheezing, rhonchi or rales.  Abdominal:     General: Bowel sounds are normal.     Palpations: Abdomen is soft.     Tenderness: There is no abdominal tenderness.  Musculoskeletal:     Cervical back: Normal range of motion and neck supple.  Skin:    General: Skin is warm and dry.     Findings: No rash.  Neurological:     Mental Status: She is alert.  Psychiatric:        Mood and Affect: Mood is not anxious or depressed.        Speech: Speech normal.        Behavior: Behavior normal. Behavior is cooperative.        Thought Content: Thought content normal.        Judgment: Judgment normal.       Results for orders placed or performed in visit on 06/13/22  IBC + Ferritin  Result Value Ref Range   Iron 42 42 - 145 ug/dL   Transferrin 393.0 (H) 212.0 - 360.0 mg/dL   Saturation Ratios 7.6 (L) 20.0 - 50.0 %   Ferritin 6.8 (L) 10.0 - 291.0 ng/mL   TIBC 550.2 (H) 250.0 - 450.0 mcg/dL  C-reactive protein  Result Value Ref Range   CRP <1.0 0.5 - 20.0 mg/dL  TSH  Result Value Ref Range   TSH 0.95 0.35 - 5.50 uIU/mL  Comp Met (CMET)  Result Value Ref Range   Sodium 139 135 - 145 mEq/L   Potassium 4.3 3.5 - 5.1 mEq/L   Chloride 105 96 - 112 mEq/L   CO2 29 19 - 32 mEq/L   Glucose, Bld 99 70 - 99 mg/dL   BUN 11 6 - 23 mg/dL   Creatinine, Ser 0.76 0.40 - 1.20 mg/dL   Total Bilirubin 0.3 0.2 - 1.2 mg/dL   Alkaline Phosphatase 85 39 - 117  U/L   AST 16 0 - 37 U/L   ALT 15 0 - 35 U/L   Total Protein 7.0 6.0 - 8.3 g/dL   Albumin 4.3 3.5 - 5.2 g/dL   GFR 93.97 >60.00 mL/min   Calcium 9.2 8.4 - 10.5 mg/dL  CBC w/Diff  Result Value Ref Range   WBC 7.3 4.0 - 10.5 K/uL   RBC 3.66 (L) 3.87 - 5.11 Mil/uL   Hemoglobin 9.5 (L) 12.0 - 15.0 g/dL   HCT 29.6 (L) 36.0 - 46.0 %   MCV 80.9 78.0 - 100.0 fl   MCHC 32.2 30.0 - 36.0 g/dL   RDW 15.1 11.5 - 15.5 %   Platelets 401.0 (H) 150.0 - 400.0 K/uL   Neutrophils Relative % 56.5 43.0 - 77.0 %   Lymphocytes Relative 34.8 12.0 - 46.0 %   Monocytes Relative 6.3 3.0 - 12.0 %   Eosinophils Relative 1.6 0.0 - 5.0 %   Basophils Relative 0.8 0.0 - 3.0 %   Neutro Abs 4.1 1.4 - 7.7 K/uL   Lymphs Abs 2.5 0.7 - 4.0 K/uL   Monocytes Absolute 0.5 0.1 - 1.0 K/uL   Eosinophils Absolute 0.1 0.0 - 0.7 K/uL   Basophils Absolute 0.1 0.0 - 0.1 K/uL     COVID 19 screen:  No recent travel or known exposure to COVID19 The patient denies respiratory symptoms of COVID 19 at this time. The importance of social distancing was discussed today.   Assessment and Plan Problem List Items Addressed This Visit     Asthma, moderate persistent  Chronic, stable control on Singulair 10 mg p.o. nightly.      Relevant Medications   montelukast (SINGULAIR) 10 MG tablet   Crohn's disease (HCC)    Chronic, followed by Dr. Havery Moros.  Endoscopy and colonoscopy yesterday.      Depression, major, in remission ([place])    Overall her depression is well controlled on Cymbalta.      Relevant Medications   DULoxetine (CYMBALTA) 30 MG capsule   DULoxetine (CYMBALTA) 60 MG capsule   Dysautonomia ([place])   Essential hypertension    Chronic well-controlled in office today on amlodipine benazepril 5/10 mg p.o. daily.  In [place], she is taking perindopril/amlodipine 10/5 mg daily as well as bisoprolol 1.25 mg p.o. daily.   Her blood pressure is well controlled but she is experiencing a recurrence of her sinus tachycardia  given she has not been taking the bisoprolol.  After review of this medication I cannot get her bisoprolol at this low dose.  Instead I will have her try metoprolol 12.5 mg p.o. twice daily until she returns to [place] and can restart her previous medication.      Relevant Medications   metoprolol tartrate (LOPRESSOR) 25 MG tablet   Gallbladder disease    I see no clear reason she should continue Derusil.  I see no studies suggesting that it is helpful in gallbladder disease, but is instead using cholestatic liver disease.  She states she has not noticed any improvement when she is taking this.  She is working on moving forward with cholecystectomy, hopefully before she leaves the country.      Gastroesophageal reflux disease without esophagitis    Chronic, stable control PPI.  dexilant 60 mg daily        Relevant Medications   dexlansoprazole (DEXILANT) 60 MG capsule   Generalized anxiety disorder    Chronic, worsening control  She states her anxiety has been significantly worse in the last few years after and during [place] in a foreign country.  We will move forward with a trial of Cymbalta at 90 mg p.o. nightly.  Of note she had sexual side effects to Zoloft so does not want to try this medication again.  We discussed other options such as Wellbutrin adjunct versus BuSpar versus change to a different SSRI/SNRI.  She will call for MyChart update in 1 month to update me on her response to the higher dose of Cymbalta. She will continue alprazolam 0.25 mg 2 tablets as needed for anxiety.      Relevant Medications   DULoxetine (CYMBALTA) 30 MG capsule   DULoxetine (CYMBALTA) 60 MG capsule   HLD (hyperlipidemia)   Relevant Medications   metoprolol tartrate (LOPRESSOR) 25 MG tablet   Other Visit Diagnoses     Hypertension, unspecified type    -  Primary   Relevant Medications   metoprolol tartrate (LOPRESSOR) 25 MG tablet   Uncomplicated asthma, unspecified asthma severity,  unspecified whether persistent       Relevant Medications   montelukast (SINGULAIR) 10 MG tablet          Eliezer Lofts, MD

## 2022-07-06 NOTE — Assessment & Plan Note (Signed)
Chronic well-controlled in office today on amlodipine benazepril 5/10 mg p.o. daily.  In [place], she is taking perindopril/amlodipine 10/5 mg daily as well as bisoprolol 1.25 mg p.o. daily.   Her blood pressure is well controlled but she is experiencing a recurrence of her sinus tachycardia given she has not been taking the bisoprolol.  After review of this medication I cannot get her bisoprolol at this low dose.  Instead I will have her try metoprolol 12.5 mg p.o. twice daily until she returns to [place] and can restart her previous medication.

## 2022-07-06 NOTE — Assessment & Plan Note (Signed)
Chronic, worsening control  She states her anxiety has been significantly worse in the last few years after and during [place] in a foreign country.  We will move forward with a trial of Cymbalta at 90 mg p.o. nightly.  Of note she had sexual side effects to Zoloft so does not want to try this medication again.  We discussed other options such as Wellbutrin adjunct versus BuSpar versus change to a different SSRI/SNRI.  She will call for MyChart update in 1 month to update me on her response to the higher dose of Cymbalta. She will continue alprazolam 0.25 mg 2 tablets as needed for anxiety.

## 2022-07-06 NOTE — Assessment & Plan Note (Signed)
Chronic, stable control on Singulair 10 mg p.o. nightly.

## 2022-07-06 NOTE — Patient Instructions (Addendum)
Trial of increased dose of Cymbalta to 90 mg daily.  Send follow up MyChart note in 1 month for re-evaluation of anxiety.  Start metoprolol 12.5 mg ( 1/2 tab of 25 mg) twice daily  flor heart racing unitl back in [place] when you will restart bisoprolol

## 2022-07-06 NOTE — Assessment & Plan Note (Signed)
Chronic, stable control PPI.  dexilant 60 mg daily

## 2022-07-06 NOTE — Assessment & Plan Note (Signed)
I see no clear reason she should continue Derusil.  I see no studies suggesting that it is helpful in gallbladder disease, but is instead using cholestatic liver disease.  She states she has not noticed any improvement when she is taking this.  She is working on moving forward with cholecystectomy, hopefully before she leaves the country.

## 2022-07-09 ENCOUNTER — Encounter: Payer: Self-pay | Admitting: Family Medicine

## 2022-07-09 ENCOUNTER — Ambulatory Visit: Payer: 59 | Admitting: Physical Therapy

## 2022-07-09 ENCOUNTER — Ambulatory Visit: Payer: 59

## 2022-07-09 ENCOUNTER — Telehealth (INDEPENDENT_AMBULATORY_CARE_PROVIDER_SITE_OTHER): Payer: 59 | Admitting: Family Medicine

## 2022-07-09 DIAGNOSIS — H109 Unspecified conjunctivitis: Secondary | ICD-10-CM | POA: Insufficient documentation

## 2022-07-09 DIAGNOSIS — H1033 Unspecified acute conjunctivitis, bilateral: Secondary | ICD-10-CM | POA: Diagnosis not present

## 2022-07-09 MED ORDER — ERYTHROMYCIN 5 MG/GM OP OINT: 1.0000 | TOPICAL_OINTMENT | Freq: Four times a day (QID) | OPHTHALMIC | 0 refills | Status: DC

## 2022-07-09 NOTE — Telephone Encounter (Signed)
I spoke with pt;on 07/08/22 pt started with itchy eyes; this morning when she woke up both eyes red, eyelids swollen,itchy and drainage from both eyes.No vision changes. Pt is on her way to [facility] now and will not be returning home until sometime on 07/12/22. Pt does not want to go to UC and request VV today. Scheduled VV with Dr Glori Bickers today at 4 PM. Explained scheduled VV with Dr Glori Bickers but if Dr Glori Bickers thinks pt needs to go to UC pt will be notified.pt voiced understanding. UC & ED precautions given and pt voiced understanding. Dr Diona Browner is out of office and sending note to Dr Glori Bickers and Pella CMA.

## 2022-07-09 NOTE — Progress Notes (Signed)
Virtual Visit via Video Note  I connected with [name] on 07/09/22 at  4:00 PM EDT by a video enabled telemedicine application and verified that I am speaking with the correct person using two identifiers.  Location: Patient: home Provider: office   I discussed the limitations of evaluation and management by telemedicine and the availability of in person appointments. The patient expressed understanding and agreed to proceed.  Parties involved in encounter  Patient: [name]  Provider:  Loura Pardon MD   History of Present Illness: 46 yo pt of Dr Diona Browner presents with eye symptoms    Symptoms started yesterday  Was out of town Discharge from eyes and itching  ? If allergies  Then eyelids felt puffy  Sticking them closed in the am  Bothered her all night  Yellow in color   Eyes look red    Used a warm wet cloth    No uri symptoms so far  No fever   Her sister has it also   She tried flushing with water Tried allergy drops  Has benadryl and zyrtec   Has not been swimming   Vision : no changes  Light sensitivity none   Washing her hands   Patient Active Problem List   Diagnosis Date Noted   Conjunctivitis 07/09/2022   Gallbladder disease 07/06/2022   Iron deficiency anemia due to chronic blood loss 06/14/2022   Herniated cervical disc 07/19/2017   Essential hypertension 03/17/2017   Immunosuppressed status ([place]) 03/27/2016   High risk medication use 07/19/2015   Long-term (current) use of anticoagulants 07/19/2015   Depression, major, in remission ([place]) 06/10/2015   History of hyperemesis gravidarum 11/08/2014   Dysautonomia ([place])    Palpitations    HLD (hyperlipidemia) 09/23/2014   Elevated LFTs 10/21/2012   Generalized anxiety disorder 10/06/2012   BPPV (benign paroxysmal positional vertigo) 03/15/2012   Crohn's disease ([place]) 07/27/2011   Asthma, moderate persistent 07/27/2011   Gastroesophageal reflux disease without esophagitis 07/27/2011    Allergic rhinitis 07/27/2011   Endometriosis 07/27/2011   Migraine 07/27/2011   Calcium nephrolithiasis 07/27/2011   Past Medical History:  Diagnosis Date   Anxiety    Asthma    Chest pain    a. s/p intermediate risk nuclear stress test on 09/23/14 and normal LHC on 09/24/14    Chronic nausea    Colonic inertia    Crohn disease ([place]) 2000   Diverticulitis    Dysautonomia ([place])    recurrent syncope s/p ILR by Dr Doreatha Lew   Endometriosis    Essential hypertension 03/17/2017   a - Renal artery [name] 3/18: normal     GERD (gastroesophageal reflux disease)    H/O hiatal hernia    Migraine    Palpitations    PONV (postoperative nausea and vomiting)    Pulmonary embolism affecting pregnancy    Vertigo    Past Surgical History:  Procedure Laterality Date   41 HOUR [place] STUDY N/A 12/31/2016   Procedure: 24 HOUR [place] STUDY;  Surgeon: Manus Gunning, MD;  Location: [facility];  Service: Gastroenterology;  Laterality: N/A;   ANTERIOR CERVICAL DECOMP/DISCECTOMY FUSION N/A 07/19/2017   Procedure: Cervical five-six Anterior cervical decompression/discectomy/fusion;  Surgeon: Erline Levine, MD;  Location: [facility];  Service: Neurosurgery;  Laterality: N/A;  C5-6 Anterior cervical decompression/discectomy/fusion   APPENDECTOMY  ~ [place]  2011   emergency  CS due to placental abruption   CESAREAN SECTION  2014   "preeclampsia"   COLON SURGERY     CYSTOSCOPY W/ STONE MANIPULATION  X 2   ENDOSCOPIC RELEASE TRANSVERSE CARPAL LIGAMENT OF HAND Right 12/2010   ESOPHAGEAL MANOMETRY N/A 12/31/2016   Procedure: ESOPHAGEAL MANOMETRY (EM);  Surgeon: Manus Gunning, MD;  Location: [facility];  Service: Gastroenterology;  Laterality: N/A;   GASTROCNEMIUS RECESSION Right 07/08/2020   Procedure: GASTROCNEMIUS SLIDE;  Surgeon: [name], MD;  Location: [facility];  Service: Orthopedics;  Laterality: Right;   HARDWARE REMOVAL Right  07/08/2020   Procedure: HARDWARE REMOVAL;  Surgeon: [name], MD;  Location: [facility];  Service: Orthopedics;  Laterality: Right;   ILEOCECETOMY  2002 X 2   KNEE ARTHROSCOPY Bilateral 1988-1990   LAPAROSCOPY FOR ECTOPIC PREGNANCY  11/2012   LEFT HEART CATHETERIZATION WITH CORONARY ANGIOGRAM N/A 09/24/2014   Procedure: LEFT HEART CATHETERIZATION WITH CORONARY ANGIOGRAM;  Surgeon: Leonie Man, MD;  Location: [facility] CATH LAB;  Service: Cardiovascular;  Laterality: N/A;   LOOP RECORDER IMPLANT  11/2009   by DR Doreatha Lew   LOOP RECORDER REMOVAL N/A 07/05/2017   Procedure: Loop Recorder Removal;  Surgeon: Evans Lance, MD;  Location: [facility];  Service: Cardiovascular;  Laterality: N/A;   NASAL SINUS SURGERY  ~ 2007   OPEN REDUCTION INTERNAL FIXATION (ORIF) FOOT LISFRANC FRACTURE Right 07/18/2018   Procedure: OPEN REDUCTION INTERNAL FIXATION (ORIF) RIGHT TARSAL METATARSAL FRACTURE and DEPO INJECTION;  Surgeon: [name], MD;  Location: [facility];  Service: Orthopedics;  Laterality: Right;   PARTIAL COLECTOMY  2002   partial removed due to crohns   TONSILLECTOMY  ~ 1996   US ECHOCARDIOGRAPHY  11/11/2009   EF 55-60%   Social History   Tobacco Use   Smoking status: Never   Smokeless tobacco: Never  Vaping Use   Vaping Use: Never used  Substance Use Topics   Alcohol use: Yes    Alcohol/week: 1.0 standard drink of alcohol    Types: 1 Glasses of wine per week    Comment: 1 glass per week   Drug use: No   Family History  Problem Relation Age of Onset   Hypertension Mother    Hyperlipidemia Mother    Arthritis Mother    Hyperlipidemia Brother    Hypertension Brother    Ulcerative colitis Brother    Esophageal cancer Neg Hx    Stomach cancer Neg Hx    Rectal cancer Neg Hx    Allergies  Allergen Reactions   Clarithromycin Other (See Comments)    Other reaction(s): Bleeding Cant take due to Chrons disease Other reaction(s):  Bleeding Other reaction(s): Bleeding    Metoclopramide Diarrhea and Nausea And Vomiting   Remicade [Infliximab] Other (See Comments)    Serum Sickness  Couldn't walk, open mouth, pain,    Sodium Ferric Gluconate [Ferrous Gluconate] Nausea And Vomiting and Swelling   Sulfa Antibiotics Hives and Other (See Comments)    Hematemesis; documented while a young child     Sulfamethoxazole Rash and Other (See Comments)    Internal bleeding and kidney infection   Doxycycline Nausea And Vomiting   Nitrofurantoin Nausea And Vomiting    Other reaction(s): GI Upset (intolerance)   Promethazine Other (See Comments)    "muscle twitches"    Ceftin [Cefuroxime Axetil] Other (See Comments)    dizziness   Compazine [Prochlorperazine]     Muscle spasm   Prochlorperazine Edisylate Nausea And Vomiting  Muscle spasm   Current Outpatient Medications on File Prior to Visit  Medication Sig Dispense Refill   albuterol (VENTOLIN HFA) 108 (90 Base) MCG/ACT inhaler Inhale 2 puffs into the lungs every 6 (six) hours as needed (bronchitis).     ALPRAZolam (XANAX) 0.25 MG tablet TAKE 2 TABLETS BY MOUTH EVERY DAY AS NEEDED (Patient taking differently: Take 0.5 mg by mouth daily as needed for anxiety. TAKE 2 TABLETS BY MOUTH EVERY DAY AS NEEDED anxiety) 180 tablet 1   amLODipine-benazepril (LOTREL) 5-10 MG capsule Take 1 capsule by mouth daily. 90 capsule 0   butalbital-acetaminophen-caffeine (FIORICET, ESGIC) 50-325-40 MG tablet TAKE TWO CAPSULES BY MOUTH EVERY 6 HOURS AS NEEDED FOR MIGRAINES 60 tablet 1   cetirizine (ZYRTEC) 10 MG tablet Take 1 tablet (10 mg total) by mouth at bedtime. 90 tablet 3   dexlansoprazole (DEXILANT) 60 MG capsule Take 1 capsule (60 mg total) by mouth daily. 90 capsule 3   DULoxetine (CYMBALTA) 30 MG capsule Take 1 capsule (30 mg total) by mouth daily. 90 capsule 3   DULoxetine (CYMBALTA) 60 MG capsule Take 1 capsule (60 mg total) by mouth daily. 90 capsule 3   leuprolide (LUPRON) 11.25  MG injection Inject 11.25 mg into the muscle every 3 (three) months.     linaclotide (LINZESS) 290 MCG CAPS capsule Take 1 capsule (290 mcg total) by mouth daily before breakfast. 30 capsule 2   metoprolol tartrate (LOPRESSOR) 25 MG tablet Take 0.5 tablets (12.5 mg total) by mouth 2 (two) times daily. 30 tablet 1   montelukast (SINGULAIR) 10 MG tablet Take 1 tablet (10 mg total) by mouth at bedtime. 90 tablet 3   vedolizumab (ENTYVIO) 300 MG injection Inject 300 mg into the vein See admin instructions. Every 6 weeks     zolpidem (AMBIEN) 10 MG tablet Take 1 tablet (10 mg total) by mouth at bedtime. 90 tablet 0   Current Facility-Administered Medications on File Prior to Visit  Medication Dose Route Frequency Provider Last Rate Last Admin   ondansetron (ZOFRAN) 4 mg in sodium chloride 0.9 % 50 mL IVPB  4 mg Intravenous Once Irene Shipper, MD       Review of Systems  Constitutional:  Negative for chills, fever and malaise/fatigue.  HENT:  Negative for congestion, ear pain, sinus pain and sore throat.   Eyes:  Positive for pain, discharge and redness. Negative for blurred vision, double vision and photophobia.  Respiratory:  Negative for cough, shortness of breath and stridor.   Cardiovascular:  Negative for chest pain, palpitations and leg swelling.  Gastrointestinal:  Negative for abdominal pain, diarrhea, nausea and vomiting.  Musculoskeletal:  Negative for myalgias.  Skin:  Negative for rash.  Neurological:  Negative for dizziness and headaches.     Observations/Objective: Patient appears well, in no distress Weight is baseline  No facial swelling or asymmetry Some injection of conjunctivae bilat (worse medially) with mild puffiness of upper lids but no rash  Normal voice-not hoarse and no slurred speech No obvious tremor or mobility impairment Moving neck and UEs normally Able to hear the call well  No cough or shortness of breath during interview  Talkative and mentally sharp with  no cognitive changes No skin changes on face or neck , no rash or pallor Affect is normal    Assessment and Plan: Problem List Items Addressed This Visit       Other   Conjunctivitis    Bilateral/ irritated with redness and discharge Was exp  to her sister with it traveling Given severity will treat for bacterial conjunctivitis but cannot rule out viral  Px emycin ointment to use Q 6 hours until better  Disc hygiene  (pt has past issue with clarithromycin which was not an allergy  but can take zpack)  Wash linens and towels Throw out makeup and start new after entirely resolved Watch for vision change or light sensitivity and update Update if not starting to improve in a week or if worsening            Follow Up Instructions: Use the erythromycin ointment as directed  Throw out eye makeup and start new when this is entirely better  You can flush eyes with saline (like for contact lenses) if needed  Use cool compress for itching  Use warm wet cloth for the crust in the am   Update if not starting to improve in a week or if worsening -especially if any vision change or light sensitivity   Wash hands/ glasses and linens frequently as this is contagious    I discussed the assessment and treatment plan with the patient. The patient was provided an opportunity to ask questions and all were answered. The patient agreed with the plan and demonstrated an understanding of the instructions.   The patient was advised to call back or seek an in-person evaluation if the symptoms worsen or if the condition fails to improve as anticipated.     Loura Pardon, MD

## 2022-07-09 NOTE — Assessment & Plan Note (Addendum)
Bilateral/ irritated with redness and discharge Was exp to her sister with it traveling Given severity will treat for bacterial conjunctivitis but cannot rule out viral  Px emycin ointment to use Q 6 hours until better  Disc hygiene  (pt has past issue with clarithromycin which was not an allergy  but can take zpack)  Wash linens and towels Throw out makeup and start new after entirely resolved Watch for vision change or light sensitivity and update Update if not starting to improve in a week or if worsening

## 2022-07-09 NOTE — Patient Instructions (Signed)
Use the erythromycin ointment as directed  Throw out eye makeup and start new when this is entirely better  You can flush eyes with saline (like for contact lenses) if needed  Use cool compress for itching  Use warm wet cloth for the crust in the am   Update if not starting to improve in a week or if worsening -especially if any vision change or light sensitivity   Wash hands/ glasses and linens frequently as this is contagious

## 2022-07-10 ENCOUNTER — Encounter: Payer: Self-pay | Admitting: Gastroenterology

## 2022-07-11 ENCOUNTER — Encounter (HOSPITAL_COMMUNITY): Payer: Self-pay | Admitting: Surgery

## 2022-07-11 ENCOUNTER — Ambulatory Visit: Payer: Self-pay | Admitting: Surgery

## 2022-07-11 ENCOUNTER — Telehealth: Payer: Self-pay | Admitting: Gastroenterology

## 2022-07-11 ENCOUNTER — Other Ambulatory Visit: Payer: Self-pay

## 2022-07-11 DIAGNOSIS — R109 Unspecified abdominal pain: Secondary | ICD-10-CM

## 2022-07-11 DIAGNOSIS — K5900 Constipation, unspecified: Secondary | ICD-10-CM

## 2022-07-11 MED ORDER — IBSRELA 50 MG PO TABS: 1.0000 | ORAL_TABLET | Freq: Two times a day (BID) | ORAL | 0 refills | Status: DC

## 2022-07-11 NOTE — Progress Notes (Signed)
PCP - Dr Eliezer Lofts Cardiologist - Dr Lovena Le in past, last OV 07/22/18 Gastroenterology - Dr Duson Cellar  Chest x-ray - n/a EKG - DOS Stress Test - 03/14/17 ECHO - 03/14/17 Cardiac Cath - 09/24/14  ICD Pacemaker/Loop - n/a  Sleep Study -  n/a CPAP - none  ERAS: Clear liquids til 6 am DOS  Anesthesia review: Yes  STOP now taking any Aspirin (unless otherwise instructed by your surgeon), Aleve, Naproxen, Ibuprofen, Motrin, Advil, Goody's, BC's, all herbal medications, fish oil, and all vitamins.   Coronavirus Screening Do you have any of the following symptoms:  Cough yes/no: No Fever (>100.95F)  yes/no: No Runny nose yes/no: No Sore throat yes/no: No Difficulty breathing/shortness of breath  yes/no: No  Have you traveled in the last 14 days and where? Lives in [place] at [employer] and is visiting family [place] area.  Patient verbalized understanding of instructions that were given via phone.

## 2022-07-11 NOTE — Telephone Encounter (Signed)
3 samples of IBSrela placed at the 2nd floor receptionist desk for patient to pick up. Patient notified via [place].

## 2022-07-11 NOTE — Progress Notes (Signed)
Anesthesia Chart Review:  Pt is a same day work up   Case: [id number] Date/Time: 07/12/22 1345   Procedure: LAPAROSCOPIC CHOLECYSTECTOMY possible open   Anesthesia type: General   Pre-op diagnosis: biliar colic   Location: [facility] OR ROOM 09 / [facility] OR   Surgeons: Dwan Bolt, MD       DISCUSSION: Pt is 46 years old with hx Crohn's disease s/p ileocecectomy X2 '02, HTN, PE, asthma, dysautonomia with recurrent syncope (s/p loop recorder 12/13/09; removal 07/05/17 by Dr. Cristopher Peru), chest pain 2015 (normal cath)  Lives in [place]. Is here for 6 week visit.   PROVIDERS: - PCP is Jinny Sanders, MD - Used to see EP cardiologist Cristopher Peru, MD for autonomic dysfunction, last office visit 07/22/18. Note documents "her symptoms are fairly stable. She will undergo watchful waiting. She will avoid dehydration and diuretic therapy"   LABS: Will be obtained day of surgery    EKG: Will be obtained day of surgery    CV: ETT 03/14/17: Blood pressure demonstrated a normal response to exercise. There was no ST segment deviation noted during stress. - Normal ETT   Echo 03/14/17:  - Left ventricle: The cavity size was normal. Systolic function was normal. The estimated ejection fraction was in the range of 55% to 60%. Wall motion was normal; there were no regional wall motion abnormalities. Left ventricular diastolic function parameters were normal.   LHC 09/24/14:  Angiographically normal coronary arteries with a right dominant system. No evidence of any culprit lesion to explain the abnormal Myoview. This would be considered a false positive Myoview stress test likely related to breast attenuation. Normal LV function with normal LVEDP.   Event monitor 09/2014 showed SR, ST.   Past Medical History:  Diagnosis Date   Anxiety    Asthma    Chest pain    a. s/p intermediate risk nuclear stress test on 09/23/14 and normal LHC on 09/24/14    Chronic nausea    Colonic inertia    Crohn disease  ([place]) 2000   Diverticulitis    Dysautonomia ([place])    recurrent syncope s/p ILR by Dr Doreatha Lew   Endometriosis    Essential hypertension 03/17/2017   a - Renal artery [name] 3/18: normal     GERD (gastroesophageal reflux disease)    H/O hiatal hernia    Migraine    Palpitations    PONV (postoperative nausea and vomiting)    Pulmonary embolism affecting pregnancy    Vertigo     Past Surgical History:  Procedure Laterality Date   64 HOUR [place] STUDY N/A 12/31/2016   Procedure: 24 HOUR [place] STUDY;  Surgeon: Manus Gunning, MD;  Location: [facility];  Service: Gastroenterology;  Laterality: N/A;   ANTERIOR CERVICAL DECOMP/DISCECTOMY FUSION N/A 07/19/2017   Procedure: Cervical five-six Anterior cervical decompression/discectomy/fusion;  Surgeon: Erline Levine, MD;  Location: [facility];  Service: Neurosurgery;  Laterality: N/A;  C5-6 Anterior cervical decompression/discectomy/fusion   APPENDECTOMY  ~ [place]  2011   emergency  CS due to placental abruption   CESAREAN SECTION  2014   "preeclampsia"   COLON SURGERY     CYSTOSCOPY W/ STONE MANIPULATION  X 2   ENDOSCOPIC RELEASE TRANSVERSE CARPAL LIGAMENT OF HAND Right 12/2010   ESOPHAGEAL MANOMETRY N/A 12/31/2016   Procedure: ESOPHAGEAL MANOMETRY (EM);  Surgeon: Manus Gunning, MD;  [facility]: [facility];  Service: Gastroenterology;  Laterality: N/A;   GASTROCNEMIUS RECESSION  Right 07/08/2020   Procedure: GASTROCNEMIUS SLIDE;  Surgeon: [name], MD;  Location: [facility];  Service: Orthopedics;  Laterality: Right;   HARDWARE REMOVAL Right 07/08/2020   Procedure: HARDWARE REMOVAL;  Surgeon: [name], MD;  Location: [facility];  Service: Orthopedics;  Laterality: Right;   ILEOCECETOMY  2002 X 2   KNEE ARTHROSCOPY Bilateral 1988-1990   LAPAROSCOPY FOR ECTOPIC PREGNANCY  11/2012   LEFT HEART CATHETERIZATION WITH CORONARY ANGIOGRAM N/A 09/24/2014    Procedure: LEFT HEART CATHETERIZATION WITH CORONARY ANGIOGRAM;  Surgeon: Leonie Man, MD;  Location: [facility] CATH LAB;  Service: Cardiovascular;  Laterality: N/A;   LOOP RECORDER IMPLANT  11/2009   by DR Doreatha Lew   LOOP RECORDER REMOVAL N/A 07/05/2017   Procedure: Loop Recorder Removal;  Surgeon: Evans Lance, MD;  Location: [facility] CV [facility];  Service: Cardiovascular;  Laterality: N/A;   NASAL SINUS SURGERY  ~ 2007   OPEN REDUCTION INTERNAL FIXATION (ORIF) FOOT LISFRANC FRACTURE Right 07/18/2018   Procedure: OPEN REDUCTION INTERNAL FIXATION (ORIF) RIGHT TARSAL METATARSAL FRACTURE and DEPO INJECTION;  Surgeon: [name], MD;  Location: [facility];  Service: Orthopedics;  Laterality: Right;   PARTIAL COLECTOMY  2002   partial removed due to crohns   TONSILLECTOMY  ~ 1996   US ECHOCARDIOGRAPHY  11/11/2009   EF 55-60%    MEDICATIONS:  ondansetron (ZOFRAN) 4 mg in sodium chloride 0.9 % 50 mL IVPB    albuterol (VENTOLIN HFA) 108 (90 Base) MCG/ACT inhaler   ALPRAZolam (XANAX) 0.25 MG tablet   amLODipine-benazepril (LOTREL) 5-10 MG capsule   butalbital-acetaminophen-caffeine (FIORICET, ESGIC) 50-325-40 MG tablet   cetirizine (ZYRTEC) 10 MG tablet   dexlansoprazole (DEXILANT) 60 MG capsule   DULoxetine (CYMBALTA) 30 MG capsule   DULoxetine (CYMBALTA) 60 MG capsule   erythromycin ophthalmic ointment   leuprolide (LUPRON) 11.25 MG injection   linaclotide (LINZESS) 290 MCG CAPS capsule   metoprolol tartrate (LOPRESSOR) 25 MG tablet   montelukast (SINGULAIR) 10 MG tablet   Tenapanor HCl (IBSRELA) 50 MG TABS   vedolizumab (ENTYVIO) 300 MG injection   zolpidem (AMBIEN) 10 MG tablet    If labs acceptable day of surgery, I anticipate pt can proceed with surgery as scheduled.  Willeen Cass, PhD, FNP-BC [facility]/Anesthesiology Phone: [phone] 07/11/2022 4:26 PM

## 2022-07-11 NOTE — Telephone Encounter (Signed)
I called the patient to discuss her course to date.  She has had right upper quadrant ultrasound, HIDA scan, CT scan abdomen pelvis, EGD, colonoscopy in recent weeks.  Concerned she was having biliary colic, her ultrasound and HIDA scan look okay although she has been on ursodiol for reported history of sludge in the past.  Her symptoms are very consistent with biliary colic (postprandial right upper quadrant pain).  CT scan showed some nonspecific thickening of her colon which was similar to prior CT scan done in [place] earlier this year.  Follow-up colonoscopy shows NO evidence of active Crohn's disease or luminal inflammation.  Her anastomosis is widely patent, there is no stricture.  She has hemorrhoidal bleeding causing her anemia based on colonoscopy.  EGD shows no evidence for cause of iron deficiency and no cause for her pain.   We discussed her course and results.  Despite the results of her recent imaging, her symptoms are concerning for possible biliary colic and it may be reasonable to consider empiric cholecystectomy with the understanding that this may or may not relieve her symptoms.  I will touch base with Dr. Zenia Resides who she has seen in the past to see if this is possible.  I will asked the patient to contact Dr. Zenia Resides as well to discuss her options.  In regards to her hemorrhoidal bleeding, I think she is a good candidate for hemorrhoid banding.  I can take care of this for her.  I discussed risk and benefits and what this entails.  The question about both of these issues is timing of when we should do it.  She is scheduled to fly back to [place] next week.  I told her I do not think it is likely realistic that her gallbladder and hemorrhoids can be treated adequately in that timeframe.  I discussed that hemorrhoid banding take several weeks, there is 3 banding's that need to occur.  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  We will touch base with Dr. Zenia Resides to see if she wants to pursue cholecystectomy and timing of that.  I can try to get hemorrhoid banding done for the patient next week, will await discussion with Dr. Zenia Resides first about timing of possible cholecystectomy.  Otherwise I do not think we need to change her Crohn's regimen, I do not think Crohn's is causing her symptoms.  She is on Linzess and having a bowel movement every other day and doing better in that regard, however still not as adequate as she wants.  I will give her some free samples of Ibsrela to see if that will help and prescribe that if it works for her.     [place], can you please place samples of IBSrela at the front desk for the patient to pick up - can give 3 bottles if available, 1 tab BID. Thanks. Can you let her know when it is there?

## 2022-07-11 NOTE — Anesthesia Preprocedure Evaluation (Addendum)
Anesthesia Evaluation  Patient identified by MRN, date of birth, ID band Patient awake    Reviewed: Allergy & Precautions, NPO status   History of Anesthesia Complications (+) PONV and history of anesthetic complications  Airway Mallampati: II  TM Distance: >3 FB Neck ROM: Full    Dental  (+) Teeth Intact, Dental Advisory Given   Pulmonary asthma ,    breath sounds clear to auscultation       Cardiovascular hypertension, Pt. on medications and Pt. on home beta blockers  Rhythm:Regular Rate:Normal  Autonomic dysfunction with loop recorder  ETT 03/14/17: ? Blood pressure demonstrated a normal response to exercise. ? There was no ST segment deviation noted during stress. - Normal ETT  Echo 03/14/17: - Left ventricle: The cavity size was normal. Systolic function was normal. The estimated ejection fraction was in the range of 55%to 60%. Wall motion was normal; there were no regional wallmotion abnormalities. Left ventricular diastolic functionparameters were normal.  LHC 09/24/14: ? Angiographically normal coronary arteries with a right dominant system. ? No evidence of any culprit lesion to explain the abnormal Myoview. This would be considered a false positive Myoview stress test likely related to breast attenuation. ? Normal LV function with normal LVEDP.   Neuro/Psych  Headaches, PSYCHIATRIC DISORDERS Anxiety Depression    GI/Hepatic Neg liver ROS, hiatal hernia, GERD  ,Crohn's s/p ileocecectomy X2   Endo/Other  negative endocrine ROS  Renal/GU Renal disease  negative genitourinary   Musculoskeletal negative musculoskeletal ROS (+)   Abdominal   Peds  Hematology  (+) Blood dyscrasia, anemia ,   Anesthesia Other Findings   Reproductive/Obstetrics                         Anesthesia Physical Anesthesia Plan  ASA: 3  Anesthesia Plan: General   Post-op Pain Management:     Induction: Intravenous  PONV Risk Score and Plan: 4 or greater and Ondansetron, Dexamethasone, Midazolam, TIVA and Scopolamine patch - Pre-op  Airway Management Planned: Oral ETT  Additional Equipment:   Intra-op Plan:   Post-operative Plan: Extubation in OR  Informed Consent: I have reviewed the patients History and Physical, chart, labs and discussed the procedure including the risks, benefits and alternatives for the proposed anesthesia with the patient or authorized representative who has indicated his/her understanding and acceptance.     Dental advisory given  Plan Discussed with: CRNA and Anesthesiologist  Anesthesia Plan Comments: (See APP note by Durel Salts, FNP   Lab Results      Component                Value               Date                      WBC                      6.2                 07/12/2022                HGB                      9.4 (L)             07/12/2022                HCT  31.6 (L)            07/12/2022                MCV                      85.2                07/12/2022                PLT                      509 (H)             07/12/2022           Lab Results      Component                Value               Date                      NA                       142                 07/12/2022                K                        3.8                 07/12/2022                CO2                      24                  07/12/2022                GLUCOSE                  95                  07/12/2022                BUN                      14                  07/12/2022                CREATININE               0.67                07/12/2022                CALCIUM                  9.0                 07/12/2022                GFRNONAA                 >60                 07/12/2022          )  Anesthesia Quick Evaluation

## 2022-07-11 NOTE — Telephone Encounter (Signed)
Patient has reviewed and responded to MyChart message. Last read by Lona Millard at  2:11 PM on 07/11/2022

## 2022-07-12 ENCOUNTER — Ambulatory Visit (HOSPITAL_COMMUNITY): Payer: 59 | Admitting: Physician Assistant

## 2022-07-12 ENCOUNTER — Encounter (HOSPITAL_COMMUNITY)
Admission: RE | Disposition: A | Discharge status: Home / Self Care | Payer: Self-pay | Source: Home / Self Care | Attending: Surgery

## 2022-07-12 ENCOUNTER — Ambulatory Visit (HOSPITAL_COMMUNITY)
Admission: RE | Admit: 2022-07-12 | Discharge: 2022-07-12 | Disposition: A | Discharge status: Home / Self Care | Payer: 59 | Attending: Surgery | Admitting: Surgery

## 2022-07-12 ENCOUNTER — Ambulatory Visit (HOSPITAL_BASED_OUTPATIENT_CLINIC_OR_DEPARTMENT_OTHER): Payer: 59 | Admitting: Physician Assistant

## 2022-07-12 ENCOUNTER — Encounter (HOSPITAL_COMMUNITY): Payer: Self-pay | Admitting: Surgery

## 2022-07-12 DIAGNOSIS — F418 Other specified anxiety disorders: Secondary | ICD-10-CM | POA: Diagnosis not present

## 2022-07-12 DIAGNOSIS — K811 Chronic cholecystitis: Secondary | ICD-10-CM | POA: Diagnosis not present

## 2022-07-12 DIAGNOSIS — Z79899 Other long term (current) drug therapy: Secondary | ICD-10-CM | POA: Insufficient documentation

## 2022-07-12 DIAGNOSIS — I10 Essential (primary) hypertension: Secondary | ICD-10-CM | POA: Diagnosis not present

## 2022-07-12 DIAGNOSIS — R1011 Right upper quadrant pain: Secondary | ICD-10-CM | POA: Diagnosis present

## 2022-07-12 DIAGNOSIS — K5090 Crohn's disease, unspecified, without complications: Secondary | ICD-10-CM | POA: Diagnosis not present

## 2022-07-12 DIAGNOSIS — K449 Diaphragmatic hernia without obstruction or gangrene: Secondary | ICD-10-CM | POA: Diagnosis not present

## 2022-07-12 DIAGNOSIS — F32A Depression, unspecified: Secondary | ICD-10-CM | POA: Diagnosis not present

## 2022-07-12 DIAGNOSIS — F419 Anxiety disorder, unspecified: Secondary | ICD-10-CM | POA: Diagnosis not present

## 2022-07-12 DIAGNOSIS — J45909 Unspecified asthma, uncomplicated: Secondary | ICD-10-CM | POA: Diagnosis not present

## 2022-07-12 DIAGNOSIS — K5909 Other constipation: Secondary | ICD-10-CM | POA: Insufficient documentation

## 2022-07-12 DIAGNOSIS — K219 Gastro-esophageal reflux disease without esophagitis: Secondary | ICD-10-CM | POA: Insufficient documentation

## 2022-07-12 HISTORY — DX: Anemia, unspecified: D64.9

## 2022-07-12 HISTORY — PX: CHOLECYSTECTOMY: SHX55

## 2022-07-12 HISTORY — DX: Personal history of urinary calculi: Z87.442

## 2022-07-12 LAB — CBC
HCT: 31.6 % — ABNORMAL LOW (ref 36.0–46.0)
Hemoglobin: 9.4 g/dL — ABNORMAL LOW (ref 12.0–15.0)
MCH: 25.3 pg — ABNORMAL LOW (ref 26.0–34.0)
MCHC: 29.7 g/dL — ABNORMAL LOW (ref 30.0–36.0)
MCV: 85.2 fL (ref 80.0–100.0)
Platelets: 509 10*3/uL — ABNORMAL HIGH (ref 150–400)
RBC: 3.71 MIL/uL — ABNORMAL LOW (ref 3.87–5.11)
RDW: 16 % — ABNORMAL HIGH (ref 11.5–15.5)
WBC: 6.2 10*3/uL (ref 4.0–10.5)
nRBC: 0 % (ref 0.0–0.2)

## 2022-07-12 LAB — BASIC METABOLIC PANEL
Anion gap: 12 (ref 5–15)
BUN: 14 mg/dL (ref 6–20)
CO2: 24 mmol/L (ref 22–32)
Calcium: 9 mg/dL (ref 8.9–10.3)
Chloride: 106 mmol/L (ref 98–111)
Creatinine, Ser: 0.67 mg/dL (ref 0.44–1.00)
GFR, Estimated: >60 mL/min (ref >60)
Glucose, Bld: 95 mg/dL (ref 70–99)
Potassium: 3.8 mmol/L (ref 3.5–5.1)
Sodium: 142 mmol/L (ref 135–145)

## 2022-07-12 LAB — POCT PREGNANCY, URINE: Preg Test, Ur: NEGATIVE

## 2022-07-12 SURGERY — LAPAROSCOPIC CHOLECYSTECTOMY
Anesthesia: General | Site: Abdomen

## 2022-07-12 MED ORDER — PHENYLEPHRINE 80 MCG/ML (10ML) SYRINGE FOR IV PUSH (FOR BLOOD PRESSURE SUPPORT)
PREFILLED_SYRINGE | INTRAVENOUS | Status: AC
Start: 2022-07-12 — End: ?
  Filled 2022-07-12: qty 10

## 2022-07-12 MED ORDER — PROPOFOL 10 MG/ML IV BOLUS
INTRAVENOUS | Status: AC
  Filled 2022-07-12: qty 20

## 2022-07-12 MED ORDER — HYDROCODONE-ACETAMINOPHEN 5-325 MG PO TABS: 1.0000 | ORAL_TABLET | Freq: Four times a day (QID) | ORAL | 0 refills | Status: DC | PRN

## 2022-07-12 MED ORDER — ROCURONIUM BROMIDE 10 MG/ML (PF) SYRINGE
PREFILLED_SYRINGE | INTRAVENOUS | Status: DC | PRN
  Administered 2022-07-12: 50 mg via INTRAVENOUS
  Administered 2022-07-12: 20 mg via INTRAVENOUS

## 2022-07-12 MED ORDER — AMISULPRIDE (ANTIEMETIC) 5 MG/2ML IV SOLN
INTRAVENOUS | Status: AC
  Administered 2022-07-12: 10 mg via INTRAVENOUS
  Filled 2022-07-12: qty 4

## 2022-07-12 MED ORDER — LACTATED RINGERS IV SOLN: INTRAVENOUS | Status: DC

## 2022-07-12 MED ORDER — DEXAMETHASONE SODIUM PHOSPHATE 10 MG/ML IJ SOLN
INTRAMUSCULAR | Status: AC
  Filled 2022-07-12: qty 1

## 2022-07-12 MED ORDER — AMISULPRIDE (ANTIEMETIC) 5 MG/2ML IV SOLN: 10.0000 mg | Freq: Once | INTRAVENOUS | Status: AC

## 2022-07-12 MED ORDER — ONDANSETRON HCL 4 MG/2ML IJ SOLN
INTRAMUSCULAR | Status: AC
  Filled 2022-07-12: qty 2

## 2022-07-12 MED ORDER — PROPOFOL 10 MG/ML IV BOLUS
INTRAVENOUS | Status: DC | PRN
  Administered 2022-07-12: 125 ug/kg/min via INTRAVENOUS
  Administered 2022-07-12: 140 mg via INTRAVENOUS

## 2022-07-12 MED ORDER — PROPOFOL 10 MG/ML IV BOLUS
INTRAVENOUS | Status: AC
Start: 2022-07-12 — End: ?
  Filled 2022-07-12: qty 20

## 2022-07-12 MED ORDER — FENTANYL CITRATE (PF) 250 MCG/5ML IJ SOLN
INTRAMUSCULAR | Status: DC | PRN
  Administered 2022-07-12 (×2): 50 ug via INTRAVENOUS
  Administered 2022-07-12: 100 ug via INTRAVENOUS
  Administered 2022-07-12: 50 ug via INTRAVENOUS

## 2022-07-12 MED ORDER — PHENYLEPHRINE 80 MCG/ML (10ML) SYRINGE FOR IV PUSH (FOR BLOOD PRESSURE SUPPORT)
PREFILLED_SYRINGE | INTRAVENOUS | Status: DC | PRN
  Administered 2022-07-12 (×2): 40 ug via INTRAVENOUS
  Administered 2022-07-12: 80 ug via INTRAVENOUS
  Administered 2022-07-12: 40 ug via INTRAVENOUS

## 2022-07-12 MED ORDER — FENTANYL CITRATE (PF) 250 MCG/5ML IJ SOLN
INTRAMUSCULAR | Status: AC
  Filled 2022-07-12: qty 5

## 2022-07-12 MED ORDER — SUGAMMADEX SODIUM 200 MG/2ML IV SOLN
INTRAVENOUS | Status: DC | PRN
  Administered 2022-07-12: 200 mg via INTRAVENOUS

## 2022-07-12 MED ORDER — CHLORHEXIDINE GLUCONATE 0.12 % MT SOLN
15.0000 mL | Freq: Once | OROMUCOSAL | Status: AC
  Administered 2022-07-12: 15 mL via OROMUCOSAL
  Filled 2022-07-12: qty 15

## 2022-07-12 MED ORDER — PHENYLEPHRINE HCL-NACL 20-0.9 MG/250ML-% IV SOLN: INTRAVENOUS | Status: DC | PRN

## 2022-07-12 MED ORDER — DEXAMETHASONE SODIUM PHOSPHATE 10 MG/ML IJ SOLN
INTRAMUSCULAR | Status: DC | PRN
  Administered 2022-07-12: 8 mg via INTRAVENOUS

## 2022-07-12 MED ORDER — SUCCINYLCHOLINE CHLORIDE 200 MG/10ML IV SOSY
PREFILLED_SYRINGE | INTRAVENOUS | Status: DC | PRN
  Administered 2022-07-12: 100 mg via INTRAVENOUS

## 2022-07-12 MED ORDER — HYDROMORPHONE HCL 1 MG/ML IJ SOLN: 0.2500 mg | INTRAMUSCULAR | Status: DC | PRN

## 2022-07-12 MED ORDER — SCOPOLAMINE 1 MG/3DAYS TD PT72
1.0000 | MEDICATED_PATCH | TRANSDERMAL | Status: DC
  Administered 2022-07-12: 1.5 mg via TRANSDERMAL
  Filled 2022-07-12: qty 1

## 2022-07-12 MED ORDER — VANCOMYCIN HCL IN DEXTROSE 1-5 GM/200ML-% IV SOLN
1000.0000 mg | INTRAVENOUS | Status: AC
  Administered 2022-07-12 (×2): 1000 mg via INTRAVENOUS
  Filled 2022-07-12: qty 200

## 2022-07-12 MED ORDER — SCOPOLAMINE 1 MG/3DAYS TD PT72
MEDICATED_PATCH | TRANSDERMAL | Status: AC
  Filled 2022-07-12: qty 1

## 2022-07-12 MED ORDER — BUPIVACAINE-EPINEPHRINE (PF) 0.25% -1:200000 IJ SOLN
INTRAMUSCULAR | Status: AC
  Filled 2022-07-12: qty 30

## 2022-07-12 MED ORDER — KETOROLAC TROMETHAMINE 30 MG/ML IJ SOLN
INTRAMUSCULAR | Status: DC | PRN
  Administered 2022-07-12: 30 mg via INTRAVENOUS

## 2022-07-12 MED ORDER — PHENYLEPHRINE HCL-NACL 20-0.9 MG/250ML-% IV SOLN
INTRAVENOUS | Status: DC | PRN
  Administered 2022-07-12: 40 ug via INTRAVENOUS

## 2022-07-12 MED ORDER — ONDANSETRON HCL 4 MG/2ML IJ SOLN
INTRAMUSCULAR | Status: DC | PRN
  Administered 2022-07-12: 4 mg via INTRAVENOUS

## 2022-07-12 MED ORDER — 0.9 % SODIUM CHLORIDE (POUR BTL) OPTIME
TOPICAL | Status: DC | PRN
  Administered 2022-07-12: 1000 mL

## 2022-07-12 MED ORDER — MIDAZOLAM HCL 2 MG/2ML IJ SOLN
INTRAMUSCULAR | Status: AC
  Filled 2022-07-12: qty 2

## 2022-07-12 MED ORDER — MIDAZOLAM HCL 2 MG/2ML IJ SOLN
INTRAMUSCULAR | Status: DC | PRN
  Administered 2022-07-12: 2 mg via INTRAVENOUS

## 2022-07-12 MED ORDER — BUPIVACAINE-EPINEPHRINE 0.25% -1:200000 IJ SOLN
INTRAMUSCULAR | Status: DC | PRN
  Administered 2022-07-12: 27 mL

## 2022-07-12 MED ORDER — LIDOCAINE 2% (20 MG/ML) 5 ML SYRINGE
INTRAMUSCULAR | Status: DC | PRN
  Administered 2022-07-12: 60 mg via INTRAVENOUS

## 2022-07-12 MED ORDER — ORAL CARE MOUTH RINSE: 15.0000 mL | Freq: Once | OROMUCOSAL | Status: AC

## 2022-07-12 SURGICAL SUPPLY — 50 items
ADH SKN CLS APL DERMABOND .7 (GAUZE/BANDAGES/DRESSINGS) ×1
APL PRP STRL LF DISP 70% ISPRP (MISCELLANEOUS) ×1
APPLIER CLIP 5 13 M/L LIGAMAX5 (MISCELLANEOUS) ×2
APR CLP MED LRG 5 ANG JAW (MISCELLANEOUS) ×1
BAG COUNTER SPONGE SURGICOUNT (BAG) ×3 IMPLANT
BAG SPEC RTRVL LRG 6X4 10 (ENDOMECHANICALS) ×1
BAG SPNG CNTER NS LX DISP (BAG) ×1
BLADE CLIPPER SURG (BLADE) IMPLANT
CANISTER SUCT 3000ML PPV (MISCELLANEOUS) ×3 IMPLANT
CHLORAPREP W/TINT 26 (MISCELLANEOUS) ×3 IMPLANT
CLIP APPLIE 5 13 M/L LIGAMAX5 (MISCELLANEOUS) ×2 IMPLANT
COVER SURGICAL LIGHT HANDLE (MISCELLANEOUS) ×3 IMPLANT
DERMABOND ADVANCED (GAUZE/BANDAGES/DRESSINGS) ×1
DERMABOND ADVANCED .7 DNX12 (GAUZE/BANDAGES/DRESSINGS) ×2 IMPLANT
ELECT REM PT RETURN 9FT ADLT (ELECTROSURGICAL) ×2
ELECTRODE REM PT RTRN 9FT ADLT (ELECTROSURGICAL) ×2 IMPLANT
GLOVE BIOGEL PI IND STRL 6 (GLOVE) ×2 IMPLANT
GLOVE BIOGEL PI INDICATOR 6 (GLOVE) ×1
GLOVE BIOGEL PI MICRO 5.5 (GLOVE) ×1
GLOVE BIOGEL PI MICRO STRL 5.5 (GLOVE) ×2 IMPLANT
GOWN STRL REUS W/ TWL LRG LVL3 (GOWN DISPOSABLE) ×6 IMPLANT
GOWN STRL REUS W/TWL LRG LVL3 (GOWN DISPOSABLE) ×6
KIT BASIN OR (CUSTOM PROCEDURE TRAY) ×3 IMPLANT
KIT TURNOVER KIT B (KITS) ×3 IMPLANT
L-HOOK LAP DISP 36CM (ELECTROSURGICAL) ×2
LHOOK LAP DISP 36CM (ELECTROSURGICAL) ×2 IMPLANT
NDL INSUFFLATION 14GA 120MM (NEEDLE) IMPLANT
NEEDLE INSUFFLATION 14GA 120MM (NEEDLE) ×2 IMPLANT
NS IRRIG 1000ML POUR BTL (IV SOLUTION) ×3 IMPLANT
PAD ARMBOARD 7.5X6 YLW CONV (MISCELLANEOUS) ×3 IMPLANT
PENCIL BUTTON HOLSTER BLD 10FT (ELECTRODE) ×3 IMPLANT
POUCH SPECIMEN RETRIEVAL 10MM (ENDOMECHANICALS) ×3 IMPLANT
SCISSORS LAP 5X35 DISP (ENDOMECHANICALS) ×3 IMPLANT
SET IRRIG TUBING LAPAROSCOPIC (IRRIGATION / IRRIGATOR) ×3 IMPLANT
SET TUBE SMOKE EVAC HIGH FLOW (TUBING) ×3 IMPLANT
SLEEVE ENDOPATH XCEL 5M (ENDOMECHANICALS) ×6 IMPLANT
SLEEVE Z-THREAD 5X100MM (TROCAR) ×2 IMPLANT
SUT MNCRL AB 4-0 PS2 18 (SUTURE) ×3 IMPLANT
SUT VIC AB 3-0 SH 27 (SUTURE)
SUT VIC AB 3-0 SH 27XBRD (SUTURE) IMPLANT
TOWEL GREEN STERILE (TOWEL DISPOSABLE) ×3 IMPLANT
TOWEL GREEN STERILE FF (TOWEL DISPOSABLE) ×3 IMPLANT
TRAY LAPAROSCOPIC MC (CUSTOM PROCEDURE TRAY) ×3 IMPLANT
TROCAR 11X100 Z THREAD (TROCAR) ×1 IMPLANT
TROCAR XCEL 12X100 BLDLESS (ENDOMECHANICALS) IMPLANT
TROCAR XCEL BLUNT TIP 100MML (ENDOMECHANICALS) ×2 IMPLANT
TROCAR XCEL NON-BLD 5MMX100MML (ENDOMECHANICALS) ×3 IMPLANT
TROCAR Z-THREAD OPTICAL 5X100M (TROCAR) ×1 IMPLANT
WARMER LAPAROSCOPE (MISCELLANEOUS) ×3 IMPLANT
WATER STERILE IRR 1000ML POUR (IV SOLUTION) ×3 IMPLANT

## 2022-07-12 NOTE — Progress Notes (Signed)
Dr. Carlota Raspberry made aware of pts Hgb 9.4. No new orders given.

## 2022-07-12 NOTE — Transfer of Care (Signed)
Immediate Anesthesia Transfer of Care Note  Patient: [name]  Procedure(s) Performed: LAPAROSCOPIC CHOLECYSTECTOMY (Abdomen)  Patient Location: PACU  Anesthesia Type:General  Level of Consciousness: awake, alert  and oriented  Airway & Oxygen Therapy: Patient Spontanous Breathing and Patient connected to nasal cannula oxygen  Post-op Assessment: Report given to RN and Post -op Vital signs reviewed and stable  Post vital signs: Reviewed and stable  Last Vitals:  Vitals Value Taken Time  BP 141/87 07/12/22 1721  Temp    Pulse 91 07/12/22 1725  Resp 19 07/12/22 1725  SpO2 100 % 07/12/22 1725  Vitals shown include unvalidated device data.  Last Pain:  Vitals:   07/12/22 1221  PainSc: 6          Complications: No notable events documented.

## 2022-07-12 NOTE — Anesthesia Procedure Notes (Addendum)
Procedure Name: Intubation Date/Time: 07/12/2022 3:53 PM  Performed by: Jenne Campus, CRNAPre-anesthesia Checklist: Patient identified, Emergency Drugs available, Suction available and Patient being monitored Patient Re-evaluated:Patient Re-evaluated prior to induction Oxygen Delivery Method: Circle System Utilized Preoxygenation: Pre-oxygenation with 100% oxygen Induction Type: IV induction, Rapid sequence and Cricoid Pressure applied Laryngoscope Size: Miller and 3 Grade View: Grade I Tube type: Oral Tube size: 7.0 mm Number of attempts: 1 Airway Equipment and Method: Stylet Placement Confirmation: ETT inserted through vocal cords under direct vision, positive ETCO2 and breath sounds checked- equal and bilateral Secured at: 21 cm Tube secured with: Tape Dental Injury: Teeth and Oropharynx as per pre-operative assessment  Comments: Lynnea Ferrier, SRNA

## 2022-07-12 NOTE — H&P (Signed)
Lona Millard May 07, 1976  [id number].     HPI:  [name] is a 46 y.o. female who was referred to evaluate for biliary colic. She has a complex GI history and moved to [place] in 2018 with her family. She is currently visiting the [name] for a 6-week period to undergo further workup and possibly surgery for her symptoms.   She has a history of Crohn's disease and underwent an ileocolic resection in Dec 2001 by Dr. Harlow Asa. She later developed an anastomotic stricture and underwent a re-resection in Oct 2002 by Dr. Deon Pilling. These are the only surgeries she has had for her Crohn's. She has been on multiple different medical treatments for Crohn's and is currently on Entyvio, which she receives every 6 weeks. She says she has not been feeling well for several years and has undergone an extensive workup in [place]. She has frequent RUQ pain that radiates to the right shoulder, which gets worse after eating. She was told by her doctors in [place] that she has sludge in the gallbladder. She has been on ursodiol for several years with no improvement in her symptoms, and feels that the pain is getting worse. She also has issues with chronic constipation and has been told she has adhesions and colonic inertia. She reports frequent bloating. She was referred for a RUQ [name] and HIDA after she first saw me in the office, both of which were normal. Recent CT scan showed possible mild colitis, and follow up colonoscopy and EGD by Dr. Havery Moros on 7/13 did not show any etiology for her pain or signs of active Crohn's. Her pain has persisted with no clear etiology on extensive. Because symptoms are typical of biliary colic, we have discussed and agreed to proceed with cholecystectomy. She is here today for surgery prior to her return to [place] next week.   ROS: Review of Systems  Constitutional:  Negative for chills and fever.  Gastrointestinal:  Positive for abdominal pain.    Family History  Problem Relation Age of Onset    Hypertension Mother    Hyperlipidemia Mother    Arthritis Mother    Hyperlipidemia Brother    Hypertension Brother    Ulcerative colitis Brother    Esophageal cancer Neg Hx    Stomach cancer Neg Hx    Rectal cancer Neg Hx     Past Medical History:  Diagnosis Date   Anemia    has gotten iron transfusions in past   Anxiety    Asthma    when she gets sick with bronchitis   Chest pain    a. s/p intermediate risk nuclear stress test on 09/23/14 and normal LHC on 09/24/14    Chronic nausea    Colonic inertia    Crohn disease ([place]) 2000   Diverticulitis    Dysautonomia ([place])    recurrent syncope s/p ILR by Dr Doreatha Lew   Endometriosis    Essential hypertension 03/17/2017   a - Renal artery [name] 3/18: normal     GERD (gastroesophageal reflux disease)    H/O hiatal hernia    History of kidney stones    surg to remove kidney stones and passed stones   Migraine    last one 04/2022   Palpitations    past hx - stress related   PONV (postoperative nausea and vomiting)    scop patch in past   Pulmonary embolism affecting pregnancy    Vertigo     Past Surgical History:  Procedure Laterality Date  [place] STUDY N/A 12/31/2016   Procedure: [place] STUDY;  Surgeon: Manus Gunning, MD;  Location: [facility];  Service: Gastroenterology;  Laterality: N/A;   ANTERIOR CERVICAL DECOMP/DISCECTOMY FUSION N/A 07/19/2017   Procedure: Cervical five-six Anterior cervical decompression/discectomy/fusion;  Surgeon: Erline Levine, MD;  Location: [facility];  Service: Neurosurgery;  Laterality: N/A;  C5-6 Anterior cervical decompression/discectomy/fusion   APPENDECTOMY  ~ 1992   c/s with tubal ligation  06/03/2015   CESAREAN SECTION  2011   emergency  CS due to placental abruption   CESAREAN SECTION  2014   "preeclampsia"   COLON SURGERY     COLONOSCOPY  07/05/2022   CYSTOSCOPY W/ STONE MANIPULATION  X 2   ENDOSCOPIC RELEASE TRANSVERSE CARPAL LIGAMENT OF HAND Right 12/2010    ESOPHAGEAL MANOMETRY N/A 12/31/2016   Procedure: ESOPHAGEAL MANOMETRY (EM);  Surgeon: Manus Gunning, MD;  Location: [facility];  Service: Gastroenterology;  Laterality: N/A;   GASTROCNEMIUS RECESSION Right 07/08/2020   Procedure: GASTROCNEMIUS SLIDE;  Surgeon: [name], MD;  Location: [facility];  Service: Orthopedics;  Laterality: Right;   HARDWARE REMOVAL Right 07/08/2020   Procedure: HARDWARE REMOVAL;  Surgeon: [name], MD;  Location: [facility];  Service: Orthopedics;  Laterality: Right;   ILEOCECETOMY  2002 X 2   KNEE ARTHROSCOPY Bilateral 1988-1990   LAPAROSCOPY FOR ECTOPIC PREGNANCY  11/2012   LEFT HEART CATHETERIZATION WITH CORONARY ANGIOGRAM N/A 09/24/2014   Procedure: LEFT HEART CATHETERIZATION WITH CORONARY ANGIOGRAM;  Surgeon: Leonie Man, MD;  Location: [facility] LAB;  Service: Cardiovascular;  Laterality: N/A;   LOOP RECORDER IMPLANT  11/2009   by DR Doreatha Lew   LOOP RECORDER REMOVAL N/A 07/05/2017   Procedure: Loop Recorder Removal;  Surgeon: Evans Lance, MD;  Location: [facility];  Service: Cardiovascular;  Laterality: N/A;   NASAL SINUS SURGERY  ~ 2007   OPEN REDUCTION INTERNAL FIXATION (ORIF) FOOT LISFRANC FRACTURE Right 07/18/2018   Procedure: OPEN REDUCTION INTERNAL FIXATION (ORIF) RIGHT TARSAL METATARSAL FRACTURE and DEPO INJECTION;  Surgeon: [name], MD;  Location: [facility];  Service: Orthopedics;  Laterality: Right;   PARTIAL COLECTOMY  2002   partial removed due to crohns   TONSILLECTOMY  ~ [place]  07/05/2022   US ECHOCARDIOGRAPHY  11/11/2009   EF 55-60%    Social History:  reports that she has never smoked. She has never used smokeless tobacco. She reports current alcohol use of about 2.0 standard drinks of alcohol per week. She reports that she does not use drugs.  Allergies:  Allergies  Allergen Reactions   Clarithromycin Other (See Comments)     Other reaction(s): Bleeding Cant take due to Chrons disease Other reaction(s): Bleeding Other reaction(s): Bleeding    Metoclopramide Diarrhea and Nausea And Vomiting   Remicade [Infliximab] Other (See Comments)    Serum Sickness  Couldn't walk, open mouth, pain,    Sodium Ferric Gluconate [Ferrous Gluconate] Nausea And Vomiting and Swelling   Sulfa Antibiotics Hives and Other (See Comments)    Hematemesis; documented while a young child     Sulfamethoxazole Rash and Other (See Comments)    Internal bleeding and kidney infection   Doxycycline Nausea And Vomiting   Nitrofurantoin Nausea And Vomiting    Other reaction(s): GI Upset (intolerance)   Promethazine Other (See Comments)    "muscle twitches"    Ceftin [Cefuroxime Axetil] Other (See Comments)  dizziness   Compazine [Prochlorperazine]     Muscle spasm   Prochlorperazine Edisylate Nausea And Vomiting    Muscle spasm    Facility-Administered Medications Prior to Admission  Medication Dose Route Frequency Provider Last Rate Last Admin   ondansetron (ZOFRAN) 4 mg in sodium chloride 0.9 % 50 mL IVPB  4 mg Intravenous Once Irene Shipper, MD       Medications Prior to Admission  Medication Sig Dispense Refill   ALPRAZolam (XANAX) 0.25 MG tablet TAKE 2 TABLETS BY MOUTH EVERY DAY AS NEEDED (Patient taking differently: Take 0.5 mg by mouth daily as needed for anxiety. TAKE 2 TABLETS BY MOUTH EVERY DAY AS NEEDED anxiety) 180 tablet 1   amLODipine-benazepril (LOTREL) 5-10 MG capsule Take 1 capsule by mouth daily. 90 capsule 0   cetirizine (ZYRTEC) 10 MG tablet Take 1 tablet (10 mg total) by mouth at bedtime. 90 tablet 3   dexlansoprazole (DEXILANT) 60 MG capsule Take 1 capsule (60 mg total) by mouth daily. 90 capsule 3   DULoxetine (CYMBALTA) 30 MG capsule Take 1 capsule (30 mg total) by mouth daily. 90 capsule 3   DULoxetine (CYMBALTA) 60 MG capsule Take 1 capsule (60 mg total) by mouth daily. 90 capsule 3   erythromycin  ophthalmic ointment Place 1 Application into both eyes in the morning, at noon, in the evening, and at bedtime. 3.5 g 0   linaclotide (LINZESS) 290 MCG CAPS capsule Take 1 capsule (290 mcg total) by mouth daily before breakfast. 30 capsule 2   metoprolol tartrate (LOPRESSOR) 25 MG tablet Take 0.5 tablets (12.5 mg total) by mouth 2 (two) times daily. 30 tablet 1   montelukast (SINGULAIR) 10 MG tablet Take 1 tablet (10 mg total) by mouth at bedtime. 90 tablet 3   zolpidem (AMBIEN) 10 MG tablet Take 1 tablet (10 mg total) by mouth at bedtime. 90 tablet 0   albuterol (VENTOLIN HFA) 108 (90 Base) MCG/ACT inhaler Inhale 2 puffs into the lungs every 6 (six) hours as needed (bronchitis).     butalbital-acetaminophen-caffeine (FIORICET, ESGIC) 50-325-40 MG tablet TAKE TWO CAPSULES BY MOUTH EVERY 6 HOURS AS NEEDED FOR MIGRAINES 60 tablet 1   leuprolide (LUPRON) 11.25 MG injection Inject 11.25 mg into the muscle every 3 (three) months.     Tenapanor HCl (IBSRELA) 50 MG TABS Take 1 tablet by mouth 2 (two) times daily. Lot# [id number] A EXP: 08/24/2023 18 tablet 0   vedolizumab (ENTYVIO) 300 MG injection Inject 300 mg into the vein See admin instructions. Every 6 weeks       Physical Exam: Blood pressure 103/69, pulse 71, temperature 98.5 F (36.9 C), resp. rate 17, height 5' 4"  (1.626 m), weight 70.1 kg, SpO2 100 %. General: resting comfortably, NAD Neuro: alert and oriented, no focal deficits Resp: normal work of breathing on room air Abdomen: soft, nondistended, nontender to palpation. Well-healed midline scars. Extremities: warm and well-perfused   Results for orders placed or performed during the hospital encounter of 07/12/22 (from the past 48 hour(s))  Pregnancy, urine POC     Status: None   Collection Time: 07/12/22 12:26 PM  Result Value Ref Range   Preg Test, Ur NEGATIVE NEGATIVE    Comment:        THE SENSITIVITY OF THIS METHODOLOGY IS >24 mIU/mL   CBC per protocol     Status: Abnormal    Collection Time: 07/12/22  1:05 PM  Result Value Ref Range   WBC 6.2 4.0 - 10.5 K/uL  RBC 3.71 (L) 3.87 - 5.11 MIL/uL   Hemoglobin 9.4 (L) 12.0 - 15.0 g/dL   HCT 31.6 (L) 36.0 - 46.0 %   MCV 85.2 80.0 - 100.0 fL   MCH 25.3 (L) 26.0 - 34.0 pg   MCHC 29.7 (L) 30.0 - 36.0 g/dL   RDW 16.0 (H) 11.5 - 15.5 %   Platelets 509 (H) 150 - 400 K/uL   nRBC 0.0 0.0 - 0.2 %    Comment: Performed at [facility] [address] [place], [place] [address]  Basic metabolic panel per protocol     Status: None   Collection Time: 07/12/22  1:05 PM  Result Value Ref Range   Sodium 142 135 - 145 mmol/L   Potassium 3.8 3.5 - 5.1 mmol/L   Chloride 106 98 - 111 mmol/L   CO2 24 22 - 32 mmol/L   Glucose, Bld 95 70 - 99 mg/dL    Comment: Glucose reference range applies only to samples taken after fasting for at least 8 hours.   BUN 14 6 - 20 mg/dL   Creatinine, Ser 0.67 0.44 - 1.00 mg/dL   Calcium 9.0 8.9 - 10.3 mg/dL   GFR, Estimated >60 >60 mL/min    Comment: (NOTE) Calculated using the CKD-EPI Creatinine Equation (2021)    Anion gap 12 5 - 15    Comment: Performed at [facility] [address], [place], [place] [address]   No results found.    Assessment/Plan 46 yo female with a history of Crohn's, presenting with persistent postprandial RUQ abdominal pain. Extensive workup has not shown a clear etiology, but symptoms are consistent with biliary colic. As no other cause has been identified, she agrees to proceed with cholecystectomy. She understands there is a chance this may not help her pain. Procedure details were reviewed and informed consent was obtained. Proceed to OR, plan for discharge home from PACU.   Michaelle Birks, [facility], Hepatobiliary and Pancreatic Surgery 07/12/22 3:31 PM

## 2022-07-12 NOTE — Op Note (Signed)
Date: 07/12/22  Patient: [name] MRN: [id number]  Preoperative Diagnosis: RUQ abdominal pain Postoperative Diagnosis: Same  Procedure: Laparoscopic lysis of adhesions, laparoscopic cholecystectomy  Surgeon: Michaelle Birks, MD  EBL: Minimal  Anesthesia: General endotracheal  Specimens: Gallbladder  Indications: [name] is a 46 yo female with a history of Crohn's disease for which she previously underwent ileocolic resection. For the past several years she has been having postprandial RUQ abdominal pain which is now very severe. Ultrasound, CT scan and HIDA did not show any gallbladder abnormalities. Endoscopic workup did not show any active Crohn's disease or other etiology for her pain. She has had an extensive workup and given symptoms are consistent with gallbladder pain with no other clear etiology, she has elected to proceed with cholecystectomy.  Findings: Omental adhesions at the previous midline incision. Mild omental adhesions to the gallbladder. No signs of acute cholecystitis.  Procedure details: Informed consent was obtained in the preoperative area prior to the procedure. The patient was brought to the operating room and placed on the table in the supine position. General anesthesia was induced and appropriate lines and drains were placed for intraoperative monitoring. Perioperative antibiotics were administered per SCIP guidelines. The abdomen was prepped and draped in the usual sterile fashion. A pre-procedure timeout was taken verifying patient identity, surgical site and procedure to be performed.  A small skin incision was made in the left upper quadrant at Palmer's point, the fascia was grasped and elevated, and a Veress needle was inserted through the fascia.  Intraperitoneal placement was confirmed with the saline drop test and the abdomen was insufflated.  A 5 mm Visiport was placed.  The peritoneal cavity was inspected with no evidence of visceral or vascular injury.  There were omental adhesions along the previous midline incision, preventing visualization of the right upper quadrant.  An additional 5 mm port was placed in the left lower quadrant under direct visualization.  The adhesions at midline were taken down using blunt and sharp dissection.  A 12 mm port was then placed in the supraumbilical position under direct visualization.  Two additional 5 mm ports were placed in the right subcostal margin. The fundus of the gallbladder was grasped and retracted cephalad.  There were some filmy omental adhesions to the dome of the gallbladder which were taken down with cautery.  The infundibulum was retracted laterally. The cystic triangle was dissected out using cautery and blunt dissection, and the critical view of safety was obtained. The cystic duct and cystic artery were clipped and ligated. The gallbladder was taken off the liver using cautery, and the specimen was placed in an endocatch bag and removed via the umbilical port. The surgical site was irrigated with saline until the effluent was clear. Hemostasis was achieved in the gallbladder fossa using cautery. The cystic duct and artery stumps were visually inspected and there was no evidence of bile leak or bleeding.   Next the ileocolic anastomosis was identified in the right upper quadrant.  The colon was grossly normal in appearance with no signs of obstruction at the anastomosis.  The small bowel was also examined and was normal in caliber with no signs of obstruction.  There were some interloop adhesions, some of which were sharply lysed, but none of these appeared to be causing obstruction.  The ports were removed under direct visualization and the abdomen was desufflated.  The umbilical port site fascia was closed with a 0 vicryl figure-of-eight suture. The skin at all port sites  was closed with 4-0 monocryl subcuticular suture. Dermabond was applied.  The patient tolerated the procedure well with no apparent  complications.  All counts were correct x2 at the end of the procedure. The patient was extubated and taken to PACU in stable condition.  Michaelle Birks, MD 07/12/22 5:13 PM

## 2022-07-12 NOTE — Anesthesia Postprocedure Evaluation (Signed)
Anesthesia Post Note  Patient: [name]  Procedure(s) Performed: LAPAROSCOPIC CHOLECYSTECTOMY (Abdomen)     Patient location during evaluation: PACU Anesthesia Type: General Level of consciousness: awake and alert Pain management: pain level controlled Vital Signs Assessment: post-procedure vital signs reviewed and stable Respiratory status: spontaneous breathing, nonlabored ventilation, respiratory function stable and patient connected to nasal cannula oxygen Cardiovascular status: blood pressure returned to baseline and stable Postop Assessment: no apparent nausea or vomiting Anesthetic complications: no   No notable events documented.  Last Vitals:  Vitals:   07/12/22 1745 07/12/22 1750  BP: 127/78 127/78  Pulse: 99 99  Resp: 18 18  Temp:  36.8 C  SpO2: 100% 100%    Last Pain:  Vitals:   07/12/22 1750  PainSc: 0-No pain                 Belenda Cruise P Adina Puzzo

## 2022-07-12 NOTE — Discharge Instructions (Signed)
[facility] DISCHARGE INSTRUCTIONS  Activity No heavy lifting greater than 15 pounds for 4 weeks after surgery. Ok to shower in 24 hours, but do not bathe or submerge incisions underwater for 2 weeks. Do not drive while taking narcotic pain medication.  Wound Care Your incisions are covered with skin glue called Dermabond. This will peel off on its own over time. You may shower and allow warm soapy water to run over your incisions in 24 hours. Gently pat dry. Do not submerge your incision underwater for 2 weeks. Monitor your incision for any new redness, tenderness, or drainage.  When to Call us: Fever greater than 100.5 New redness, drainage, or swelling at incision site Severe pain, nausea, or vomiting Jaundice (yellowing of the whites of the eyes or skin)  Follow-up You will have a phone call follow up with Dr. Zenia Resides in about 1 week prior to your return to [place]. If you would like to schedule an in-person appointment at any time, please call the [facility] office at the number below.   For questions or concerns, please call the office at [phone].

## 2022-07-13 ENCOUNTER — Encounter (HOSPITAL_COMMUNITY): Payer: Self-pay | Admitting: Surgery

## 2022-07-13 ENCOUNTER — Ambulatory Visit: Payer: 59

## 2022-07-13 ENCOUNTER — Other Ambulatory Visit: Payer: Self-pay

## 2022-07-15 ENCOUNTER — Encounter: Payer: Self-pay | Admitting: Family Medicine

## 2022-07-16 ENCOUNTER — Ambulatory Visit: Payer: 59

## 2022-07-16 ENCOUNTER — Encounter: Payer: Self-pay | Admitting: Gastroenterology

## 2022-07-16 MED ORDER — HYDROCODONE-ACETAMINOPHEN 5-325 MG PO TABS: 1.0000 | ORAL_TABLET | Freq: Four times a day (QID) | ORAL | 0 refills | Status: DC | PRN

## 2022-07-16 NOTE — Telephone Encounter (Signed)
Dr Havery Moros is it possible for this pt to have an order to get iron infusions in [place]??

## 2022-07-17 ENCOUNTER — Ambulatory Visit: Payer: 59

## 2022-07-17 LAB — SURGICAL PATHOLOGY

## 2022-08-10 ENCOUNTER — Telehealth: Payer: Self-pay | Admitting: Family Medicine

## 2022-08-10 DIAGNOSIS — J45909 Unspecified asthma, uncomplicated: Secondary | ICD-10-CM

## 2022-08-10 NOTE — Telephone Encounter (Signed)
Prescription request was sent over for patient and placed in Dr. Diona Browner box. Thank you!

## 2022-08-13 ENCOUNTER — Other Ambulatory Visit: Payer: Self-pay | Admitting: *Deleted

## 2022-08-13 MED ORDER — LINACLOTIDE 290 MCG PO CAPS: 290.0000 ug | ORAL_CAPSULE | Freq: Every day | ORAL | 3 refills | Status: DC

## 2022-08-13 NOTE — Telephone Encounter (Signed)
Received faxed request on Singular, Dexiant, Cymbalta and Metoprolol. All new scripts were sent to [facility] to cancel and sent to [facility]

## 2022-08-13 NOTE — Telephone Encounter (Signed)
Sent a my chart message to patient to find if these scripts need to be sent to [facility]. The new scripts were sent to [facility] on 07/06/22 with 3 refills each. Unable to reach patient by telephone, voicemail not set up.

## 2022-08-14 MED ORDER — DULOXETINE HCL 60 MG PO CPEP: 60.0000 mg | ORAL_CAPSULE | Freq: Every day | ORAL | 3 refills | Status: DC

## 2022-08-14 MED ORDER — DEXLANSOPRAZOLE 60 MG PO CPDR: 60.0000 mg | DELAYED_RELEASE_CAPSULE | Freq: Every day | ORAL | 3 refills | Status: DC

## 2022-08-14 MED ORDER — DULOXETINE HCL 30 MG PO CPEP: 30.0000 mg | ORAL_CAPSULE | Freq: Every day | ORAL | 3 refills | Status: DC

## 2022-08-14 MED ORDER — METOPROLOL TARTRATE 25 MG PO TABS: 12.5000 mg | ORAL_TABLET | Freq: Two times a day (BID) | ORAL | 3 refills | Status: DC

## 2022-08-14 MED ORDER — MONTELUKAST SODIUM 10 MG PO TABS: 10.0000 mg | ORAL_TABLET | Freq: Every day | ORAL | 3 refills | Status: DC

## 2022-08-14 NOTE — Telephone Encounter (Signed)
Patient currently resides in [place] and would like to use [facility] for her prescriptions.

## 2022-08-20 NOTE — Telephone Encounter (Signed)
Refills were sent in last week.

## 2022-09-17 ENCOUNTER — Other Ambulatory Visit: Payer: Self-pay | Admitting: Family Medicine

## 2022-10-15 ENCOUNTER — Other Ambulatory Visit: Payer: Self-pay | Admitting: *Deleted

## 2022-10-15 MED ORDER — AMLODIPINE BESY-BENAZEPRIL HCL 5-10 MG PO CAPS: 1.0000 | ORAL_CAPSULE | Freq: Every day | ORAL | 3 refills | Status: DC

## 2022-12-08 ENCOUNTER — Encounter: Payer: Self-pay | Admitting: Gastroenterology

## 2023-09-20 ENCOUNTER — Encounter: Payer: Self-pay | Admitting: Family Medicine

## 2023-09-24 ENCOUNTER — Other Ambulatory Visit: Payer: Self-pay | Admitting: Family Medicine

## 2023-09-24 MED ORDER — ESCITALOPRAM OXALATE 10 MG PO TABS: ORAL_TABLET | ORAL | Status: DC

## 2023-09-24 NOTE — Telephone Encounter (Signed)
[name], can you help with this?  This is a patient who is in [place].  She wants [name] to email a copy on the letterhead of the prescription for Lexapro and then she can printed out and take it to the pharmacy?  Is this possible?  Or do we need to fax it to her something?

## 2023-09-25 ENCOUNTER — Encounter: Payer: Self-pay | Admitting: Family Medicine

## 2024-04-09 ENCOUNTER — Telehealth (INDEPENDENT_AMBULATORY_CARE_PROVIDER_SITE_OTHER): Payer: Self-pay | Admitting: Gastroenterology

## 2024-04-09 DIAGNOSIS — K922 Gastrointestinal hemorrhage, unspecified: Secondary | ICD-10-CM

## 2024-04-09 DIAGNOSIS — D62 Acute posthemorrhagic anemia: Secondary | ICD-10-CM | POA: Diagnosis not present

## 2024-04-09 NOTE — Telephone Encounter (Signed)
 Patient called stated she is currently in [place] and the doctors there are recommending she get a blood transfusion and she is seeking advise from our office. She was advised of Dr. Tena Feeling schedule and that a nurse would advise further. [phone]

## 2024-04-09 NOTE — Telephone Encounter (Signed)
 Pt states 04/26/23 she was very weak and she thought she was anemic, Hgb was 6.8. She got a blood transfusion then and was transferred to another hospital. There she was found to have a blockage in her large intestine and they called it neurogenic bowel, 7.7cm. She did not have surgery. They did a colon, patency capsule, VCE. She had rectovaginal bladder prolapse and an unbilical hernia. She states when the hernia pops out and she pushes it back in she starts seeing blood in her stool. Hgb keeps dropping.   States her crohn's is in complete remission on Parmele Entyvio,  Finally she had an egd and a polyp that was bleeding was removed. Bleeding stopped.   In March her hernia popped back out and she pushed it back and bleeding started back again. Hgb went from 6.9 to 5.1, she got 3 units of blood and went up to 8.9. GI doc thinks perhaps she should have an internal hemorrhoidectomy, she states she is not bleeding there and does not want to have that surgery just so they can say its not internal hems.  Today her Hgb is 7.4 and they want her to come for more blood. She does not want to do this, she wants them to so another EGD but she can't get them to listen. She states Dr. General Kenner is the only one she trusts and would like his opinion. She knows he is out of the office this week.  Iron down to 23, range 50-150 Hgb 7.4 Hct 23  Please advise. Can reach her on magic app at [phone]

## 2024-04-13 NOTE — Telephone Encounter (Signed)
 PT is still in [place] and has been hospitalized again. She has had three blood transfusions and very concerned. Please advise.

## 2024-04-13 NOTE — Telephone Encounter (Signed)
 See notes below. Pt concerned and wants to hear from Dr General Kenner.

## 2024-04-13 NOTE — Telephone Encounter (Signed)
 I called the number provided, no answer, I left a message and will try to call her again tomorrow.

## 2024-04-14 NOTE — Telephone Encounter (Signed)
 I added her to your schedule in June, that should give her access to you via mychart again. I will delete the appt in several days.

## 2024-04-14 NOTE — Telephone Encounter (Signed)
 Good afternoon Dr. General Kenner,   Patient returned your call. States it was midnight when you called, due to the 6 hour time difference. She states she would keep her ringer on to listen out for your call.   Thank you.

## 2024-04-14 NOTE — Telephone Encounter (Signed)
 Great, thank you Stana Ear

## 2024-04-14 NOTE — Telephone Encounter (Signed)
 I called the patient, she is currently in a hospital in [place] and gave me an overview of what has been happening.  Spoke with her on the phone for approximately 15 minutes.  She has been readmitted to the hospital multiple occurrences for recurrent GI bleeding.  Sounds like she is passing maroon-colored stools when she bleeds.  She has required 10 blood transfusions since March timeframe.  She has had an EGD and colonoscopy today, states no cause of bleeding has been found.  Her Crohn's appears to be in remission.  Sounds like she is probably having small bowel bleeding.  She inquires about next steps if she were here.  I told her I would typically do a capsule endoscopy to assess for small bowel bleeding, and if she was having significant bleeding would do a CT angiogram (CTA), or if mild intermittent bleeding could do a tagged RBC cell scan.  If she is having small bowel bleeding that is active, potentially may need balloon enteroscopy.  I am not sure what capabilities they have at the hospital where she is currently.  I have asked her to inquire about a transfer to a tertiary care center in [place], one of the larger hospitals in her region which should have capability to do some of these tests if they do not at her current hospital.  She asks about coming back to [place]  for her care.  Certainly if she is here I am happy to take care of her, it would require inpatient evaluation in the setting of bleeding.  My concern is that she is having active intermittent bleeding with significant anemia and not safe to travel internationally in her current state from what she is telling me.  Discussed risks of her having significant bleeding on a flight etc.  I would encourage her to get the best care she can in her region and I am certainly happy to give her my opinion as questions arise.  She understands and will keep me posted.    Stana Ear, she has been having a hard time sending a MyChart message to me,  apparently it only goes to her primary care.  Can you see if epic staff can add me to her MyChart message options so she can directly send me a message?  Thanks

## 2024-04-15 ENCOUNTER — Encounter: Payer: Self-pay | Admitting: Gastroenterology

## 2024-04-15 NOTE — Telephone Encounter (Signed)
 Thanks Stana Ear - I also got the email. From what Torrie Frei is saying, the patient is staying in [place], which I think is her best option, although she also sent an email saying she may come back to [place]. I recommend she stay in [place] and go to a tertiary care center there for her evaluation, if she can. If she cannot she could come back to the [place]  / [place] area for evaluation, but with her bleeding symptoms and severe anemia I did not think it safe for her to travel right now until her Hgb has stabilized.

## 2024-04-15 NOTE — Telephone Encounter (Signed)
 Pt notified via mychart

## 2024-04-15 NOTE — Telephone Encounter (Signed)
 Spoke with patient , states she will stay in [place] and be evaluated. Please advise if needing to f/u.   Thank you

## 2024-04-16 NOTE — Addendum Note (Signed)
 Addended by: Ace Holder on: 04/16/2024 04:39 PM   Modules accepted: Level of Service

## 2024-06-03 ENCOUNTER — Encounter: Payer: Self-pay | Admitting: Family Medicine

## 2024-06-03 ENCOUNTER — Telehealth: Payer: Self-pay

## 2024-06-03 ENCOUNTER — Ambulatory Visit: Admitting: Gastroenterology

## 2024-06-03 DIAGNOSIS — J45909 Unspecified asthma, uncomplicated: Secondary | ICD-10-CM

## 2024-06-03 NOTE — Telephone Encounter (Signed)
 Got it. I will see her at 415 PM on 6/25 if she can make that? If so can you add her into the schedule? Please tell her I am out of town the first two days that week and that is the only day I am free but will add her in to help sort this out.  Thanks

## 2024-06-03 NOTE — Telephone Encounter (Signed)
 Pt sent an email, states she was unable to send a note to Dr General Kenner through [facility].  See note below:  My hemoglobin dropped again from 11.9 to 8.9 today. I'm flying to [facility] on June 19th. Can I please get on Dr. Tena Feeling schedule? I have the CT and MRI disks that I am bringing with me. Does he have any availability the week of the 23-27? I may need longer to debrief him. I do not want to go back into the hospital here.   [name]  You are only in the office that week on 06/17/24. Please advise if you want to double book or do you want her scheduled with an APP. Please advise.

## 2024-06-04 NOTE — Telephone Encounter (Signed)
 Pt added in to see Dr General Kenner 06/17/24@4 :20pm. Pt notified of appt via mychart.

## 2024-06-08 ENCOUNTER — Other Ambulatory Visit: Payer: Self-pay

## 2024-06-08 DIAGNOSIS — M549 Dorsalgia, unspecified: Secondary | ICD-10-CM

## 2024-06-09 DIAGNOSIS — M5416 Radiculopathy, lumbar region: Secondary | ICD-10-CM

## 2024-06-15 ENCOUNTER — Ambulatory Visit (INDEPENDENT_AMBULATORY_CARE_PROVIDER_SITE_OTHER): Admitting: Family Medicine

## 2024-06-15 VITALS — BP 110/88 | HR 66 | Ht 64.0 in | Wt 153.0 lb

## 2024-06-15 DIAGNOSIS — M5416 Radiculopathy, lumbar region: Secondary | ICD-10-CM | POA: Diagnosis not present

## 2024-06-15 DIAGNOSIS — R32 Unspecified urinary incontinence: Secondary | ICD-10-CM

## 2024-06-15 DIAGNOSIS — K922 Gastrointestinal hemorrhage, unspecified: Secondary | ICD-10-CM

## 2024-06-15 MED ORDER — GABAPENTIN 300 MG PO CAPS: 300.0000 mg | ORAL_CAPSULE | Freq: Three times a day (TID) | ORAL | 3 refills | Status: DC | PRN

## 2024-06-15 MED ORDER — PREDNISONE 50 MG PO TABS: 50.0000 mg | ORAL_TABLET | Freq: Every day | ORAL | 0 refills | Status: DC

## 2024-06-15 NOTE — Patient Instructions (Signed)
 Thank you for coming in today.   You should hear from Dr Elmyra office shortly.   You should schedule the [facility] after your appointment with him.   Let me know if you need anything.   Bring your documents to Dr CINDERELLA.
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
## 2024-06-16 ENCOUNTER — Inpatient Hospital Stay
Admission: RE | Admit: 2024-06-16 | Discharge: 2024-06-16 | Disposition: A | Discharge status: Home / Self Care | Payer: Self-pay | Source: Ambulatory Visit | Attending: Gastroenterology

## 2024-06-16 ENCOUNTER — Other Ambulatory Visit: Payer: Self-pay

## 2024-06-16 DIAGNOSIS — K50918 Crohn's disease, unspecified, with other complication: Secondary | ICD-10-CM

## 2024-06-16 NOTE — Telephone Encounter (Signed)
 Forwarding to Dr. Denyse Amass to review and advise.

## 2024-06-17 ENCOUNTER — Ambulatory Visit: Admitting: Gastroenterology

## 2024-06-17 ENCOUNTER — Telehealth: Payer: Self-pay | Admitting: Family Medicine

## 2024-06-17 VITALS — BP 110/70 | Ht 64.0 in | Wt 150.4 lb

## 2024-06-17 DIAGNOSIS — D649 Anemia, unspecified: Secondary | ICD-10-CM | POA: Diagnosis not present

## 2024-06-17 DIAGNOSIS — K625 Hemorrhage of anus and rectum: Secondary | ICD-10-CM | POA: Diagnosis not present

## 2024-06-17 DIAGNOSIS — R109 Unspecified abdominal pain: Secondary | ICD-10-CM

## 2024-06-17 DIAGNOSIS — M549 Dorsalgia, unspecified: Secondary | ICD-10-CM

## 2024-06-17 DIAGNOSIS — K50818 Crohn's disease of both small and large intestine with other complication: Secondary | ICD-10-CM | POA: Diagnosis not present

## 2024-06-17 DIAGNOSIS — K5909 Other constipation: Secondary | ICD-10-CM

## 2024-06-17 DIAGNOSIS — K439 Ventral hernia without obstruction or gangrene: Secondary | ICD-10-CM

## 2024-06-17 DIAGNOSIS — D509 Iron deficiency anemia, unspecified: Secondary | ICD-10-CM | POA: Diagnosis not present

## 2024-06-17 NOTE — Progress Notes (Signed)
 HPI : CROHNS HISTORY 48 y/o female with ileocolonic stricturing / perforating Crohn's disease   Ileocolonic Crohns - diagnosed in 2002. She reported she presented with a bowel obstruction with perforation and had surgery upon diagnosis and had 2 surgeries within the first year of diagnosis. She reports a history of abscess formation and fistula development (enteroenteral fistulas?). She was eventually placed on Remicade , only for a short period of time, perhaps 1-2 infusions - she was uncertain if it worked or not, but stopped it for unclear reasons. She had been on 6MP or steroids over time otherwise, she states she has intermittently been on . She has been on mesalamines in the past which did not help. She has been treated for bacterial overgrowth over time given ileocecectomy. She has had 2 surgeries total for her Crohns. She has been previously treated with Humira  which appeared to put her disease into remission, however developed severe arthralgias, evaluated by Rheumatology who thought it was related to Humira , which was stopped and her symptoms resolved. She was then given a dose of Remicade  and developed a serum sickness which led to hospital admission, treated with steroids.    She otherwise has a history of uveitis and iritis with Cronhns flares. She reports she also has significant back pain and arthralgias due to Crohns flares. She takes tramadol  for pain, does not NSAIDs.   Her pregnancy was complicated by PE x 2. Was on coumadin  but now off all anticoagulants. She has been on IV iron  in the past.  She has been on Entyvio  since June 2017, living in [place] in recent years.     SINCE THE LAST VISIT:   49 year old female here for a follow-up visit.  Recall I last saw her about 2 years ago.  She lives in [place] with her husband who is principal of the school at an [employer] in [place].  I saw her a few years ago for abdominal pain, ultimately led to a workup to have her gallbladder removed.  I  have not seen her since that time.  She has remained on Entyvio  injections every 2 weeks since have seen her.  Her Crohn's has reportedly been well-controlled.  Main issue she is here is that she has been having problems with recurrent anemia with rectal bleeding and iron  deficiency.  She states in June 2024 she was having dark cherry/burgundy blood in her stools intermittently with anemia.  She had an EGD and colonoscopy at that time, as well as a capsule endoscopy and imaging of her abdomen.  She states she had a polyp removed from her stomach that was the source of her bleeding and after removal she was doing well for period of time.  Unfortunately she has had some recurrence of symptoms this past year.  She thinks in March and then again in April she had significant episodes of rectal bleeding with passing dark blood and some clots.  She brings lab work and records from her workup in [place] which I reviewed extensively.  Her hemoglobin has dropped to fives on multiple occasions, she has received a total of 9 units of blood over a few admissions during this time.  She gets extremely fatigued with this level of anemia.  She has undergone repeated endoscopy, colonoscopy x 2, CT angiography and MR enterography thus far.  She has grade 3 hemorrhoids but she states she has no constipation, moves her bowels regularly and does not strain.  She subjectively does not think her hemorrhoids are  causing the bleeding.  She states she has had surgeons evaluate her in the setting of her bleeding and they did not think it was the source of her bleeding either.  She has been offered hemorrhoidectomy which she has declined.  She was treated with hemorrhoid banding x 2 per her surgeon in [place] and she states it did not make any difference to her bleeding symptoms so surgery was not pursued.  She has been on and off iron , her hemoglobin was in the eights when she left [place] and had it last checked.  She continues to have  intermittent bleeding that is sporadic, sometimes a few times per week or month.  Recall she has chronic constipation with neurogenic colon on the right side of her colon after she has had resection.  She takes Linzess  daily which allows her to move her bowels fairly regularly and she is pretty happy with that.  She did have a capsule endoscopy in [place] in 2020 for which was unremarkable other than showing a small bowel diverticulum.  I do not have the results of her endoscopy and one of her colonoscopy from 2025, the rest of her records I have outlined as below.  In addition she has a diastases recti/ventral hernia at the site of her prior scarring near her umbilicus.  She is interested in having this repaired.  She has been using a fair amount of NSAIDs and Tylenol  lately for recurrent back pain.  She has had problems with back pain radiating down her leg with numbness.  She has a disc bulge from L5-S1 and has been referred to neurosurgery from sports medicine.  That consultation is pending.  Overall she has been told that her Crohn's disease appears well-controlled and they do not think that is causing her anemia.  She continues to have intermittent abdominal pain in her right abdomen at times, of unclear etiology, sounds like no source was found on her imaging other than her colon changes.  She has never had an enteroscopy, she inquires about workup she can have done here before she leaves.  She states she is in town for about 3 weeks, 1 of which she will be vacationing at [place] with her family.       Prior workup here: EGD 12/14/16 - 1cm hiatal hernia, otherwise normal exam, benign stomach polyps fundic gland Colonoscopy 12/14/16 - normal colon and ileum, few small ulcers at anastomosis - normal colon bx Esophageal manometry 12/2016 - normal 24 HR pH impedance testing - Demeester score of 4.1, 41 symptoms reported, symptom index of 12% for reflux     Colonoscopy 09/2021 - [place] -  reported Crohn's in remission - widely patent anastomosis and normal bowel?   MRE enterography 10/28/21 - [place] no dilated loops of small bowel or pathologic abnormalities      S/p Lap chole July 2023  CT abdomen / pelvis 06/27/22: IMPRESSION: 1. There is a 8 cm segment of proximal transverse colon which appears partially decompressed and exhibits mild wall thickening. There is also mild wall thickening and mucosal enhancement involving the sigmoid colon. Findings are compatible with multifocal segmental mild colitis. No signs to suggest penetrating disease, abscess or bowel obstruction. 2. Small hiatal hernia.   Workup in [place] - review of records:  Bleeding / anemia March-June 2024 Recurrence Feb-April 2025  03/10/2024 - Hgb 5.5 - 3 units PRBC  Colonoscopy 2024 - perianastomotic ulcers without active IBD, hemorrhoids VCE 2024 - small bowel diverticulum MRE 2024 -  delayed transit? Another colonoscopy 2024 - normal EGD 2024 - gastric polyp removed - no details provided Colonoscopy 03/12/2024 - normal, grade III hemorrhoids  CT scan abdomen / pelvis 04/29/24 - dilated right colon to 6.7cm, anastomosis normal, diverticulosis  CT scan abdomen pelvis 05/12/24 - benign hepatic cyst, small hiatal hernia, left adnexal cyst  Past Medical History:  Diagnosis Date   Anemia    has gotten iron  transfusions in past   Anxiety    Asthma    when she gets sick with bronchitis   Chest pain    a. s/p intermediate risk nuclear stress test on 09/23/14 and normal LHC on 09/24/14    Chronic nausea    Colonic inertia    Crohn disease (HCC) 2000   Diverticulitis    Dysautonomia (HCC)    recurrent syncope s/p ILR by Dr Tisa   Endometriosis    Essential hypertension 03/17/2017   a - Renal artery US  3/18: normal     GERD (gastroesophageal reflux disease)    H/O hiatal hernia    History of kidney stones    surg to remove kidney stones and passed stones   Migraine    last one 04/2022    Palpitations    past hx - stress related   PONV (postoperative nausea and vomiting)    scop patch in past   Pulmonary embolism affecting pregnancy    Vertigo      Past Surgical History:  Procedure Laterality Date   24 HOUR PH STUDY N/A 12/31/2016   Procedure: 24 HOUR PH STUDY;  Surgeon: Elspeth Deward Naval, MD;  Location: [facility];  Service: Gastroenterology;  Laterality: N/A;   ANTERIOR CERVICAL DECOMP/DISCECTOMY FUSION N/A 07/19/2017   Procedure: Cervical five-six Anterior cervical decompression/discectomy/fusion;  Surgeon: Unice Pac, MD;  Location: [facility] OR;  Service: Neurosurgery;  Laterality: N/A;  C5-6 Anterior cervical decompression/discectomy/fusion   APPENDECTOMY  ~ 1992   c/s with tubal ligation  06/03/2015   CESAREAN SECTION  2011   emergency  CS due to placental abruption   CESAREAN SECTION  2014   preeclampsia   CHOLECYSTECTOMY N/A 07/12/2022   Procedure: LAPAROSCOPIC CHOLECYSTECTOMY;  Surgeon: Dasie Leonor CROME, MD;  Location: [facility] OR;  Service: General;  Laterality: N/A;   COLON SURGERY     COLONOSCOPY  07/05/2022   CYSTOSCOPY W/ STONE MANIPULATION  X 2   ENDOSCOPIC RELEASE TRANSVERSE CARPAL LIGAMENT OF HAND Right 12/2010   ESOPHAGEAL MANOMETRY N/A 12/31/2016   Procedure: ESOPHAGEAL MANOMETRY (EM);  Surgeon: Elspeth Deward Naval, MD;  Location: [facility];  Service: Gastroenterology;  Laterality: N/A;   GASTROCNEMIUS RECESSION Right 07/08/2020   Procedure: GASTROCNEMIUS SLIDE;  Surgeon: Beverley Evalene BIRCH, MD;  Location: [facility];  Service: Orthopedics;  Laterality: Right;   HARDWARE REMOVAL Right 07/08/2020   Procedure: HARDWARE REMOVAL;  Surgeon: Beverley Evalene BIRCH, MD;  Location: [facility];  Service: Orthopedics;  Laterality: Right;   ILEOCECETOMY  2002 X 2   KNEE ARTHROSCOPY Bilateral 1988-1990   LAPAROSCOPY FOR ECTOPIC PREGNANCY  11/2012   LEFT HEART CATHETERIZATION WITH CORONARY ANGIOGRAM N/A 09/24/2014   Procedure:  LEFT HEART CATHETERIZATION WITH CORONARY ANGIOGRAM;  Surgeon: Alm LELON Clay, MD;  Location: [facility];  Service: Cardiovascular;  Laterality: N/A;   LOOP RECORDER IMPLANT  11/2009   by DR Tisa   LOOP RECORDER REMOVAL N/A 07/05/2017   Procedure: Loop Recorder Removal;  Surgeon: Waddell Danelle LELON, MD;  Location: [facility] INVASIVE CV LAB;  Service: Cardiovascular;  Laterality: N/A;   NASAL SINUS SURGERY  ~ 2007   OPEN REDUCTION INTERNAL FIXATION (ORIF) FOOT LISFRANC FRACTURE Right 07/18/2018   Procedure: OPEN REDUCTION INTERNAL FIXATION (ORIF) RIGHT TARSAL METATARSAL FRACTURE and DEPO INJECTION;  Surgeon: Beverley Evalene BIRCH, MD;  Location: [facility];  Service: Orthopedics;  Laterality: Right;   PARTIAL COLECTOMY  2002   partial removed due to crohns   TONSILLECTOMY  ~ 1996   UPPER GI ENDOSCOPY  07/05/2022   US  ECHOCARDIOGRAPHY  11/11/2009   EF 55-60%   Family History  Problem Relation Age of Onset   Hypertension Mother    Hyperlipidemia Mother    Arthritis Mother    Hyperlipidemia Brother    Hypertension Brother    Ulcerative colitis Brother    Esophageal cancer Neg Hx    Stomach cancer Neg Hx    Rectal cancer Neg Hx    Social History   Tobacco Use   Smoking status: Never   Smokeless tobacco: Never  Vaping Use   Vaping status: Never Used  Substance Use Topics   Alcohol use: Yes    Alcohol/week: 2.0 standard drinks of alcohol    Types: 2 Glasses of wine per week    Comment: social   Drug use: No   Current Outpatient Medications  Medication Sig Dispense Refill   albuterol  (VENTOLIN  HFA) 108 (90 Base) MCG/ACT inhaler Inhale 2 puffs into the lungs every 6 (six) hours as needed (bronchitis).     butalbital -acetaminophen -caffeine  (FIORICET , ESGIC ) 50-325-40 MG tablet TAKE TWO CAPSULES BY MOUTH EVERY 6 HOURS AS NEEDED FOR MIGRAINES 60 tablet 1   cetirizine  (ZYRTEC ) 10 MG tablet Take 1 tablet (10 mg total) by mouth at bedtime. 90 tablet 3   dexlansoprazole  (DEXILANT )  60 MG capsule Take 1 capsule (60 mg total) by mouth daily. 90 capsule 3   leuprolide (LUPRON) 11.25 MG injection Inject 11.25 mg into the muscle every 3 (three) months.     linaclotide  (LINZESS ) 290 MCG CAPS capsule Take 1 capsule (290 mcg total) by mouth daily before breakfast. 90 capsule 3   montelukast  (SINGULAIR ) 10 MG tablet Take 1 tablet (10 mg total) by mouth at bedtime. 90 tablet 3   vedolizumab  (ENTYVIO ) 300 MG injection Inject 300 mg into the vein See admin instructions. Every 6 weeks     zolpidem  (AMBIEN ) 10 MG tablet Take 1 tablet (10 mg total) by mouth at bedtime. 90 tablet 0   No current facility-administered medications for this visit.   Allergies  Allergen Reactions   Clarithromycin Other (See Comments)    Other reaction(s): Bleeding Cant take due to Chrons disease Other reaction(s): Bleeding Other reaction(s): Bleeding    Metoclopramide  Diarrhea and Nausea And Vomiting   Remicade  [Infliximab ] Other (See Comments)    Serum Sickness  Couldn't walk, open mouth, pain,    Sodium Ferric Gluconate [Ferrous Gluconate] Nausea And Vomiting and Swelling   Sulfa Antibiotics Hives and Other (See Comments)    Hematemesis; documented while a young child     Sulfamethoxazole Rash and Other (See Comments)    Internal bleeding and kidney infection   Doxycycline  Nausea And Vomiting   Nitrofurantoin Nausea And Vomiting    Other reaction(s): GI Upset (intolerance)   Promethazine Other (See Comments)    muscle twitches    Ceftin  [Cefuroxime  Axetil] Other (See Comments)    dizziness   Compazine  [Prochlorperazine ]     Muscle spasm   Prochlorperazine  Edisylate Nausea And Vomiting    Muscle spasm  Review of Systems: All systems reviewed and negative except where noted in HPI.   Labs reviewed from records in [place]  Physical Exam: BP 110/70   Ht 5' 4 (1.626 m)   Wt 150 lb 6.4 oz (68.2 kg)   BMI 25.82 kg/m  Constitutional: Pleasant,well-developed, female in no acute  distress. Abdominal: Soft, nondistended, nontender. Periumbilical hernia / ventral hernia. There are no masses palpable.  Extremities: no edema Neurological: Alert and oriented to person place and time. Skin: Skin is warm and dry. No rashes noted. Psychiatric: Normal mood and affect. Behavior is normal.   ASSESSMENT: 48 y.o. female here for assessment of the following  1. Symptomatic anemia   2. Iron  deficiency anemia, unspecified iron  deficiency anemia type   3. Rectal bleeding   4. Crohn's disease of both small and large intestine with other complication (HCC)   5. Ventral hernia without obstruction or gangrene   6. Abdominal cramping   7. Chronic constipation   8. Back pain, unspecified back location, unspecified back pain laterality, unspecified chronicity    Extensive chart review and discussion with the patient.  Intermittent GI bleeding causing symptomatic iron  deficiency anemia.  She has had an extensive evaluation in [place] over the past 2 years for this.  From what she tells me they thought it was initially due to a gastric polyp that was bleeding and treated in 2024. She had a repeat EGD which showed no recurrence or pathology there however. In total she has had 2 colonoscopies this year and an EGD without clear cause.  She has had an MRE and a CTA in the setting of her symptoms which have not shown any active Crohn's disease or clear cause for her bleeding otherwise.  She does have a ventral hernia noted and has been offered repair, I recommended she hold off on that for now until we sort out what is causing her anemia/bleeding.  She had hemorrhoids banded as a potential cause of her bleeding symptoms however that has not made any difference and she subjectively feels she is not having hemorrhoidal bleeding.  My suspicion is that she is probably having small bowel bleeding based on her workup to date and description of symptoms.  I am recommending a capsule endoscopy to further  evaluate initially while she is here from [place] over the next few weeks.  Will try to do this is soon as we can and recommend doing this with a full bowel prep given her history of dysmotility and constipation in the past.  I want to ensure we get good visualization.  I discussed risks of this and capsule retention.  She does have a history of surgical treatment for her Crohn's in the past but has not had any obstructions related to this since then.  She has had ileus/dysmotility from constipation.  We discussed the risks and benefits of the procedure and she agrees to proceed. She tolerated a capsule in 2024.  Will try to get this done as soon as we can.  I discussed that if we do find pathology on this to account for her anemia she could need a standard enteroscopy or even balloon enteroscopy pending findings.  If she needs a balloon enteroscopy she would need to be referred to a tertiary care center and that could significantly influence timing of when she could return to [place].  In the interim, while waiting to get this done, I will have her CT scan and MRE reviewed by our radiologist to get  their input, they are currently at [facility] with plans to be uploaded into epic from what I am told.  I will have her go to the lab otherwise to check her CBC and iron  studies and give her IV iron  in the interim if needed.  She has been taking NSAIDs lately for her back pain, I recommend she completely avoid taking NSAIDs, she was placed on steroids by sports medicine and Tylenol .  She has a pending consultation from neurosurgery for her back.  Will also provide some IBgard to get for bowel spasms in the interim.  Overall Crohn's is reportedly well-controlled, will await capsule study.  She is continuing Entyvio  injections for treatment of that.  Based on findings and her course it may be likely that she needs to extend her time from [place] here to complete this workup to provide more definitive treatment of her  recurrent anemia   PLAN: - lab for CBC and iron  studies - stop all NSAIDs  - review CT and MRE with radiology - being uploaded into Epic currently - schedule capsule endoscopy with full Miralax  bowel prep beforehand - to be done ASAP. May need enteroscopy based on results. - trial of IB gard for spasms - samples given - continue Entyvio  injections - awaiting neurosurgery consultation  I spent > 60 minutes of time, including in depth chart review, face-to-face time with the patient, and documentation.   Marcey Naval, MD [facility]  CC: Avelina Greig BRAVO, MD

## 2024-06-17 NOTE — Telephone Encounter (Signed)
 I am not sure who Dr. Joane wants the patient to see can you let me know since there are two different things being said. Thank you

## 2024-06-17 NOTE — Telephone Encounter (Signed)
 LVM with Becky letting her know that we want patient to be scheduled at [facility] neurosurgery

## 2024-06-17 NOTE — Patient Instructions (Addendum)
 CAPSULE ENDOSCOPY PATIENT INSTRUCTION SHEET  [name] [birth year] [id number]   7-8  Seven (7) days prior to capsule endoscopy stop taking iron  supplements and carafate.  7-13  Two (2) days prior to capsule endoscopy stop taking aspirin  or any arthritis drugs.  7-14  Day before capsule endoscopy purchase a 238 gram bottle of Miralax  from the laxative section of your drug store, and a 64 oz. bottle of Gatorade (no red).     One (1) day prior to capsule endoscopy: Stop smoking. Eat a regular diet until 12:00 Noon. After 12:00 Noon take only the following: Black coffee  Jell-O (no fruit or red Jell-o) Water   Bouillon (chicken or beef) 7-Up   Cranberry Juice Tea   Kool-Aid Popsicle (not red) Sprite   Coke Ginger Ale  Pepsi Mountain Dew Gatorade At 6:00 pm the evening before your appointment, drink 14 capfuls of Miralax  with 64 oz. Gatorade. Drink 8 oz every 15 minutes until gone. Nothing to eat or drink after midnight except medications with a sip of water.  07-07-24 Day of capsule endoscopy:  No medications for 2 hours prior to your test.  Please arrive at [facility]  3rd floor patient registration area by 8:30am on: 07-07-24.   For any questions: Call [facility] at [phone] and ask to speak with one of the capsule endoscopy nurses.  YOU WILL NEED TO RETURN THE EQUIPMENT AT 4 PM ON THE DAY OF THE PROCEDURE.  PLEASE KEEP THIS IN MIND WHEN SCHEDULING.   Small Bowel Capsule Endoscopy  What you should know: Small Bowel capsule endoscopy is a procedure that takes pictures of the inside of your small intestine (bowel).  Your small bowel connects to your stomach on one end, and your large bowel (colon) on the other.  A capsule endoscopy is done by swallowing a pill size camera.  The capsule moves through your stomach and into your small bowel, where pictures are taken.   You may need a small bowel capsule endoscopy if you have symptoms, such as blood in your stool,  chronic stomach pain, and diarrhea.  The pictures may show if you have growths, swelling, and bleeding area in you small bowel.  A capsule endoscopy may also show if diseases such as Crohn's or celiac disease are causing your symptoms.  Having a small bowel capsule endoscopy may help you and your caregiver learn the cause of your symptoms.  Learning what is causing your symptoms allows you to receive needed treatment and prevent further problems. Risks: You may have stomach pain during your procedure.   The pictures taken by the capsule may not be clear.   The pictures may not show the cause of your symptoms.   You may need another endoscopy procedure.   The capsule may get trapped in your esophagus or intestines. You may need surgery or additional procedures to remove the capsule from your body.    Before your procedure: You will be instructed to stop certain prescription medications or over- the -counter medications prior to the procedure.   The day before your scheduled appointment you will need to be on a restricted diet and will need to drink a bowel prep that will clean out your bowels.   The day of the procedure: You may drive yourself to the procedure.   You will need to plan on 2 trips to the office on the day of the procedure. Morning: Plan to be at the office about 45 minutes. The morning  of the procedure a sensor belt and recorder will be placed on you.  You will wear this for 8 hours.  (The sensor belt transfers pictures of your small bowel to the recorder.)   You will be given a pill-sized capsule endoscope to swallow.  Once you swallow the capsule it will travel through your body the same way food does, constantly taking pictures along the way.  The capsule takes 2-3 pictures a second.   Once you have left the office you may go about your normal day with a few exceptions: You may not go near a MRI machine or a radio or television towers; You need to avoid other patients having  capsule endoscopy; You will be given a written diet to follow for the day.  Afternoon: You will need to be return to the office at your designated time. The sensors belt will be removed You will need to be at the office about 15 minutes. ___________________________________________________    Please go to the lab in the basement of our building to have lab work done. Hit B for basement when you get on the elevator.  When the doors open the lab is on your left.  We will call you with the results. Thank you.  Our lab is located in the basement of our building, located at [address]. They are open Monday through Friday from 7:30 am to 5:00 pm. You do not need an appointment.  We have given you samples of the following medication to take: IBgard - take as directed   Stop ALL NSAIDS.  Thank you for entrusting me with your care and for choosing [facility], Dr. Elspeth Naval      If your blood pressure at your visit was 140/90 or greater, please contact your primary care physician to follow up on this. ______________________________________________________  If you are age 30 or older, your body mass index should be between 23-30. Your Body mass index is 25.82 kg/m. If this is out of the aforementioned range listed, please consider follow up with your Primary Care Provider.  If you are age 62 or younger, your body mass index should be between 19-25. Your Body mass index is 25.82 kg/m. If this is out of the aformentioned range listed, please consider follow up with your Primary Care Provider.  ________________________________________________________  The [facility] providers would like to encourage you to use MYCHART to communicate with providers for non-urgent requests or questions.  Due to long hold times on the telephone, sending your provider a message by [facility] may be a faster and more efficient way to get a response.  Please allow 48 business hours for a response.   Please remember that this is for non-urgent requests.  _______________________________________________________  Due to recent changes in healthcare laws, you may see the results of your imaging and laboratory studies on MyChart before your provider has had a chance to review them.  We understand that in some cases there may be results that are confusing or concerning to you. Not all laboratory results come back in the same time frame and the provider may be waiting for multiple results in order to interpret others.  Please give us  48 hours in order for your provider to thoroughly review all the results before contacting the office for clarification of your results.

## 2024-06-17 NOTE — Telephone Encounter (Signed)
 Becky from [facility] called and states that the referral they received says [facility] but the office note tells the patient they should be hearing from Dr. Katrina. Please confirm if this is the correct location for the patient. They provided their call back number of [phone] for Becky.

## 2024-06-18 ENCOUNTER — Ambulatory Visit: Payer: Self-pay | Admitting: Gastroenterology

## 2024-06-18 ENCOUNTER — Other Ambulatory Visit: Payer: Self-pay

## 2024-06-18 ENCOUNTER — Other Ambulatory Visit

## 2024-06-18 DIAGNOSIS — D649 Anemia, unspecified: Secondary | ICD-10-CM

## 2024-06-18 DIAGNOSIS — K5909 Other constipation: Secondary | ICD-10-CM

## 2024-06-18 DIAGNOSIS — K625 Hemorrhage of anus and rectum: Secondary | ICD-10-CM | POA: Diagnosis not present

## 2024-06-18 DIAGNOSIS — K50818 Crohn's disease of both small and large intestine with other complication: Secondary | ICD-10-CM

## 2024-06-18 DIAGNOSIS — D509 Iron deficiency anemia, unspecified: Secondary | ICD-10-CM | POA: Diagnosis not present

## 2024-06-18 DIAGNOSIS — R109 Unspecified abdominal pain: Secondary | ICD-10-CM

## 2024-06-18 LAB — CBC WITH DIFFERENTIAL/PLATELET
Basophils Absolute: 0.1 10*3/uL (ref 0.0–0.1)
Basophils Relative: 1.1 % (ref 0.0–3.0)
Eosinophils Absolute: 0.2 10*3/uL (ref 0.0–0.7)
Eosinophils Relative: 2 % (ref 0.0–5.0)
HCT: 29.6 % — ABNORMAL LOW (ref 36.0–46.0)
Hemoglobin: 9.6 g/dL — ABNORMAL LOW (ref 12.0–15.0)
Lymphocytes Relative: 34.3 % (ref 12.0–46.0)
Lymphs Abs: 2.6 10*3/uL (ref 0.7–4.0)
MCHC: 32.4 g/dL (ref 30.0–36.0)
MCV: 73.6 fl — ABNORMAL LOW (ref 78.0–100.0)
Monocytes Absolute: 0.5 10*3/uL (ref 0.1–1.0)
Monocytes Relative: 6.2 % (ref 3.0–12.0)
Neutro Abs: 4.2 10*3/uL (ref 1.4–7.7)
Neutrophils Relative %: 56.4 % (ref 43.0–77.0)
Platelets: 353 10*3/uL (ref 150.0–400.0)
RBC: 4.03 Mil/uL (ref 3.87–5.11)
RDW: 18.9 % — ABNORMAL HIGH (ref 11.5–15.5)
WBC: 7.5 10*3/uL (ref 4.0–10.5)

## 2024-06-18 LAB — IBC + FERRITIN
Ferritin: 5 ng/mL — ABNORMAL LOW (ref 10.0–291.0)
Iron: 72 ug/dL (ref 42–145)
Saturation Ratios: 13.9 % — ABNORMAL LOW (ref 20.0–50.0)
TIBC: 518 ug/dL — ABNORMAL HIGH (ref 250.0–450.0)
Transferrin: 370 mg/dL — ABNORMAL HIGH (ref 212.0–360.0)

## 2024-06-19 ENCOUNTER — Ambulatory Visit (INDEPENDENT_AMBULATORY_CARE_PROVIDER_SITE_OTHER): Admitting: Gastroenterology

## 2024-06-19 ENCOUNTER — Telehealth: Payer: Self-pay

## 2024-06-19 ENCOUNTER — Ambulatory Visit: Admitting: Family Medicine

## 2024-06-19 ENCOUNTER — Encounter: Payer: Self-pay | Admitting: Gastroenterology

## 2024-06-19 DIAGNOSIS — K633 Ulcer of intestine: Secondary | ICD-10-CM | POA: Diagnosis not present

## 2024-06-19 DIAGNOSIS — D509 Iron deficiency anemia, unspecified: Secondary | ICD-10-CM

## 2024-06-19 DIAGNOSIS — D62 Acute posthemorrhagic anemia: Secondary | ICD-10-CM

## 2024-06-19 NOTE — Telephone Encounter (Signed)
 Dr. Leigh and Rock, patient will be scheduled as soon as possible.  Auth Submission: NO AUTH NEEDED Site of care: Site of care: [facility] Payer: [employer] commercial Medication & CPT/J Code(s) submitted: Venofer  (Iron  Sucrose) J1756 Diagnosis Code:  Route of submission (phone, fax, portal):  Phone # Fax # Auth type: Buy/Bill PB Units/visits requested: 200mg  x 5 doses Reference number:  Approval from: 06/19/24 to 10/19/24

## 2024-06-19 NOTE — Telephone Encounter (Signed)
 Dr Leigh wants this pt to get IV iron . He knows she had an allergic reaction to ferrous gluconate. He did not remember what she got last time. Please let me know so we can place the orders.

## 2024-06-19 NOTE — Addendum Note (Signed)
 Addended by: PERLEY HANDING R on: 06/19/2024 12:32 PM   Modules accepted: Orders

## 2024-06-19 NOTE — Telephone Encounter (Signed)
 Rock, She received Venofer  (preferred med).  Thanks Luke

## 2024-06-19 NOTE — Telephone Encounter (Signed)
 Order entered for venofer , please contact pt regarding appt.

## 2024-06-19 NOTE — Patient Instructions (Signed)
You may have clear liquids beginning at 10:30 am after ingesting the capsule.    You can have a light lunch at 12:30 pm; sandwich and half bowl of soup.  Return to the office at 4 pm to return the equipment.   Return to you normal diet at 5 pm.   Call [phone] and ask for Glendora Score, RN if you have any questions.  You should pass the capsule in your stool 8-48 hours after ingestion. If you have not passed the capsule, after 72 hours, please contact the office at [phone].

## 2024-06-19 NOTE — Progress Notes (Unsigned)
 SN: [id number] Exp: 2025-02-20 LOT: [id number]  Patient arrived for VCE. Reported the prep went well. This RN explained capsule dietary restrictions for the next few hours. Pt advised to return at 4 pm to return capsule equipment.  Patient verbalized understanding. Opened capsule, ensured capsule was flashing prior to the patient swallowing the capsule. Patient swallowed capsule without difficulty.  Patient told to call the office with any questions and if capsule has not passed after 72 hours. No further questions by the conclusion of the visit.

## 2024-06-19 NOTE — Telephone Encounter (Signed)
 Great, thank you!

## 2024-06-22 ENCOUNTER — Ambulatory Visit (INDEPENDENT_AMBULATORY_CARE_PROVIDER_SITE_OTHER)
Admission: RE | Admit: 2024-06-22 | Discharge: 2024-06-22 | Disposition: A | Discharge status: Home / Self Care | Source: Ambulatory Visit | Attending: Gastroenterology

## 2024-06-22 ENCOUNTER — Telehealth: Payer: Self-pay | Admitting: Gastroenterology

## 2024-06-22 ENCOUNTER — Other Ambulatory Visit (INDEPENDENT_AMBULATORY_CARE_PROVIDER_SITE_OTHER)

## 2024-06-22 DIAGNOSIS — D509 Iron deficiency anemia, unspecified: Secondary | ICD-10-CM

## 2024-06-22 DIAGNOSIS — K625 Hemorrhage of anus and rectum: Secondary | ICD-10-CM

## 2024-06-22 LAB — CBC WITH DIFFERENTIAL/PLATELET
Basophils Absolute: 0.1 10*3/uL (ref 0.0–0.1)
Basophils Relative: 1.1 % (ref 0.0–3.0)
Eosinophils Absolute: 0.1 10*3/uL (ref 0.0–0.7)
Eosinophils Relative: 2.1 % (ref 0.0–5.0)
HCT: 30.5 % — ABNORMAL LOW (ref 36.0–46.0)
Hemoglobin: 9.7 g/dL — ABNORMAL LOW (ref 12.0–15.0)
Lymphocytes Relative: 35.3 % (ref 12.0–46.0)
Lymphs Abs: 2.3 10*3/uL (ref 0.7–4.0)
MCHC: 31.7 g/dL (ref 30.0–36.0)
MCV: 74.3 fl — ABNORMAL LOW (ref 78.0–100.0)
Monocytes Absolute: 0.5 10*3/uL (ref 0.1–1.0)
Monocytes Relative: 8 % (ref 3.0–12.0)
Neutro Abs: 3.5 10*3/uL (ref 1.4–7.7)
Neutrophils Relative %: 53.5 % (ref 43.0–77.0)
Platelets: 326 10*3/uL (ref 150.0–400.0)
RBC: 4.1 Mil/uL (ref 3.87–5.11)
RDW: 18.9 % — ABNORMAL HIGH (ref 11.5–15.5)
WBC: 6.6 10*3/uL (ref 4.0–10.5)

## 2024-06-22 NOTE — Addendum Note (Signed)
 Addended by: CLAUDENE NAOMIE SAILOR on: 06/22/2024 02:42 PM   Modules accepted: Orders

## 2024-06-22 NOTE — Discharge Instructions (Signed)

## 2024-06-22 NOTE — Telephone Encounter (Signed)
 Attempted to reach patient by phone but female picked up. He states he will ask patient to look at her mychart for updates.

## 2024-06-22 NOTE — Telephone Encounter (Signed)
 Spoke to patient. She would like to have both xray and cbc completed at our office. Orders updated to reflect change in location.

## 2024-06-22 NOTE — Telephone Encounter (Addendum)
 Dr Humphrey,  Please advise. Patient had capsule endoscopy placed 06/19/24. She states that she has not passed her capsule. Would you give it a bit longer (has been less than 72 hours) or would you like me to order KUB?  Also, see patient message from 06/22/24:  [name] to [facility] Clinical Pool (supporting Barbara SHAUNNA Leigh, MD) (Selected Message)    06/22/24 10:53 AM Dear amazing gastro team. On Friday, I did the capsule study without problems. Friday evening around 8pm, I went to the bathroom and the bleeding returned pretty bad. On Saturday, the bleeding subsided and began again in the mid afternoon. On Sunday, I bleed when using the bathroom in the morning and at lunch my husband had a stroke and I rushed him to [facility]. Luckily we arrived in time and he is responding to medicine in the stroke ICU. The added stress is killing my gut. When I go to the bathroom, I am losing a lot of blood. As of 8:45 tonight (Sunday) I still haven't passed the pill am which is odd. I keep checking. It usually passes in 24-36 hours. Should I be concerned? I am sleeping on a cot in the ICU next to my husband however if I need to do anything, let me know. I just wanted to keep you both updated. Thanks again. What a nightmare.

## 2024-06-22 NOTE — Telephone Encounter (Signed)
 Inbound call from patient, states she has not passed her capsule and she is bleeding. She states she is currently at [facility] due to her husband having health issues. She states if she needs to get a scan she can get it done there due to she already being there because of her husband. Patient requested to speak to a nurse to further steps.

## 2024-06-23 ENCOUNTER — Ambulatory Visit
Admission: RE | Admit: 2024-06-23 | Discharge: 2024-06-23 | Disposition: A | Discharge status: Home / Self Care | Source: Ambulatory Visit | Attending: Family Medicine | Admitting: Family Medicine

## 2024-06-23 ENCOUNTER — Ambulatory Visit: Payer: Self-pay | Admitting: Family Medicine

## 2024-06-23 ENCOUNTER — Telehealth: Payer: Self-pay | Admitting: Gastroenterology

## 2024-06-23 ENCOUNTER — Encounter: Payer: Self-pay | Admitting: Family Medicine

## 2024-06-23 ENCOUNTER — Ambulatory Visit: Payer: Self-pay | Admitting: Gastroenterology

## 2024-06-23 ENCOUNTER — Ambulatory Visit (INDEPENDENT_AMBULATORY_CARE_PROVIDER_SITE_OTHER): Admitting: Family Medicine

## 2024-06-23 VITALS — BP 120/72 | HR 78 | Temp 97.7°F | Ht 63.5 in | Wt 151.1 lb

## 2024-06-23 DIAGNOSIS — Z13220 Encounter for screening for lipoid disorders: Secondary | ICD-10-CM

## 2024-06-23 DIAGNOSIS — D849 Immunodeficiency, unspecified: Secondary | ICD-10-CM

## 2024-06-23 DIAGNOSIS — I10 Essential (primary) hypertension: Secondary | ICD-10-CM

## 2024-06-23 DIAGNOSIS — E538 Deficiency of other specified B group vitamins: Secondary | ICD-10-CM

## 2024-06-23 DIAGNOSIS — E559 Vitamin D deficiency, unspecified: Secondary | ICD-10-CM

## 2024-06-23 DIAGNOSIS — Z0000 Encounter for general adult medical examination without abnormal findings: Secondary | ICD-10-CM | POA: Diagnosis not present

## 2024-06-23 DIAGNOSIS — M5416 Radiculopathy, lumbar region: Secondary | ICD-10-CM

## 2024-06-23 DIAGNOSIS — Z131 Encounter for screening for diabetes mellitus: Secondary | ICD-10-CM | POA: Diagnosis not present

## 2024-06-23 DIAGNOSIS — N8110 Cystocele, unspecified: Secondary | ICD-10-CM

## 2024-06-23 DIAGNOSIS — K50818 Crohn's disease of both small and large intestine with other complication: Secondary | ICD-10-CM

## 2024-06-23 DIAGNOSIS — D500 Iron deficiency anemia secondary to blood loss (chronic): Secondary | ICD-10-CM

## 2024-06-23 DIAGNOSIS — N390 Urinary tract infection, site not specified: Secondary | ICD-10-CM

## 2024-06-23 DIAGNOSIS — K623 Rectal prolapse: Secondary | ICD-10-CM

## 2024-06-23 DIAGNOSIS — N814 Uterovaginal prolapse, unspecified: Secondary | ICD-10-CM

## 2024-06-23 LAB — LIPID PANEL
Cholesterol: 199 mg/dL (ref 0–200)
HDL: 47.5 mg/dL (ref >39.00)
LDL Cholesterol: 97 mg/dL (ref 0–99)
NonHDL: 151.11
Total CHOL/HDL Ratio: 4
Triglycerides: 272 mg/dL — ABNORMAL HIGH (ref 0.0–149.0)
VLDL: 54.4 mg/dL — ABNORMAL HIGH (ref 0.0–40.0)

## 2024-06-23 LAB — COMPREHENSIVE METABOLIC PANEL WITH GFR
ALT: 17 U/L (ref 0–35)
AST: 17 U/L (ref 0–37)
Albumin: 4.5 g/dL (ref 3.5–5.2)
Alkaline Phosphatase: 86 U/L (ref 39–117)
BUN: 15 mg/dL (ref 6–23)
CO2: 27 meq/L (ref 19–32)
Calcium: 9.3 mg/dL (ref 8.4–10.5)
Chloride: 105 meq/L (ref 96–112)
Creatinine, Ser: 0.75 mg/dL (ref 0.40–1.20)
GFR: 94.12 mL/min (ref >60.00)
Glucose, Bld: 100 mg/dL — ABNORMAL HIGH (ref 70–99)
Potassium: 4.5 meq/L (ref 3.5–5.1)
Sodium: 139 meq/L (ref 135–145)
Total Bilirubin: 0.3 mg/dL (ref 0.2–1.2)
Total Protein: 7 g/dL (ref 6.0–8.3)

## 2024-06-23 LAB — VITAMIN D 25 HYDROXY (VIT D DEFICIENCY, FRACTURES): VITD: 11.14 ng/mL — ABNORMAL LOW (ref 30.00–100.00)

## 2024-06-23 LAB — VITAMIN B12: Vitamin B-12: 169 pg/mL — ABNORMAL LOW (ref 211–911)

## 2024-06-23 LAB — HEMOGLOBIN A1C: Hgb A1c MFr Bld: 5.9 % (ref 4.6–6.5)

## 2024-06-23 LAB — MAGNESIUM: Magnesium: 1.8 mg/dL (ref 1.5–2.5)

## 2024-06-23 MED ORDER — METHYLPREDNISOLONE ACETATE 40 MG/ML INJ SUSP (RADIOLOG
80.0000 mg | Freq: Once | INTRAMUSCULAR | Status: AC
  Administered 2024-06-23: 80 mg via EPIDURAL

## 2024-06-23 MED ORDER — IOPAMIDOL (ISOVUE-M 200) INJECTION 41%
1.0000 mL | Freq: Once | INTRAMUSCULAR | Status: AC
  Administered 2024-06-23: 1 mL via EPIDURAL

## 2024-06-23 NOTE — Progress Notes (Signed)
 Patient ID: [name], female    DOB: Jun 25, 1976, 48 y.o.   MRN: [id number]  This visit was conducted in person.  BP 120/72   Pulse 78   Temp 97.7 F (36.5 C) (Temporal)   Ht 5' 3.5 (1.613 m)   Wt 151 lb 2 oz (68.5 kg)   SpO2 98%   BMI 26.35 kg/m    CC:  Chief Complaint  Patient presents with   Annual Exam    Subjective:   HPI: [name] is a 48 y.o. female presenting on 06/23/2024 for Annual Exam  The patient presents for complete physical and review of chronic health problems. He/She also has the following acute concerns today: Husband had stroke over the weekend and is in the ICU  She has not been seen since 2023.  She has been living in [place] where her husband is working as a principal of a  school.  Reviewed recent office visit note from GI.  She continues to have issues with Crohn's disease and resulting complications.  She is on Entyvio  300 mg injections every 6 weeks.  They have recently done labs and further evaluation.   hEDS syndrome considered. Autonomic dysfunction, Fatigue,pelvic prolapse, TMJ, vasovagal orthostatic intolerance, joint hypermobility. Crohns   Iron  deficiency anemia:  planning iron  infusion.  Hg 9.6  Currently in process of capsule endoscopy to loo for source. Bleeding started after attack when kicked in abdomen.  Chronic back pain... planning ESI today  per [facility] imaging.  Hypertension:  Well controlled on [place] she is taking   atenolol 50 mg once daily. BP Readings from Last 3 Encounters:  06/23/24 120/72  06/17/24 110/70  06/15/24 110/88  Using medication without problems or lightheadedness:  Chest pain with exertion: none Edema: on plane flight Short of breath: none Average home BPs: Other issues: Wt Readings from Last 3 Encounters:  06/23/24 151 lb 2 oz (68.5 kg)  06/17/24 150 lb 6.4 oz (68.2 kg)  06/15/24 153 lb (69.4 kg)    GAD:   well controlled on escitalopram   20 mg daily. Occ using Alprazolam  0.5 mg  prn. Using Ambien  for sleep.  Has issues with  recurrent UTI , uterine prolapse, rectal, bladder prolapse.. made need referral to Urogyn for possible surgery. HAS had work up in [place].. likely needs surgery  Has chronic stress  incontinence.   Relevant past medical, surgical, family and social history reviewed and updated as indicated. Interim medical history since our last visit reviewed. Allergies and medications reviewed and updated. Outpatient Medications Prior to Visit  Medication Sig Dispense Refill   albuterol  (VENTOLIN  HFA) 108 (90 Base) MCG/ACT inhaler Inhale 2 puffs into the lungs every 6 (six) hours as needed (bronchitis).     butalbital -acetaminophen -caffeine  (FIORICET , ESGIC ) 50-325-40 MG tablet TAKE TWO CAPSULES BY MOUTH EVERY 6 HOURS AS NEEDED FOR MIGRAINES 60 tablet 1   cetirizine  (ZYRTEC ) 10 MG tablet Take 1 tablet (10 mg total) by mouth at bedtime. 90 tablet 3   dexlansoprazole  (DEXILANT ) 60 MG capsule Take 1 capsule (60 mg total) by mouth daily. 90 capsule 3   leuprolide (LUPRON) 11.25 MG injection Inject 11.25 mg into the muscle every 3 (three) months.     linaclotide  (LINZESS ) 290 MCG CAPS capsule Take 1 capsule (290 mcg total) by mouth daily before breakfast. 90 capsule 3   montelukast  (SINGULAIR ) 10 MG tablet Take 1 tablet (10 mg total) by mouth at bedtime. 90 tablet 3   vedolizumab  (ENTYVIO ) 300 MG injection  Inject 300 mg into the vein See admin instructions. Every 6 weeks     zolpidem  (AMBIEN ) 10 MG tablet Take 1 tablet (10 mg total) by mouth at bedtime. 90 tablet 0   No facility-administered medications prior to visit.     Per HPI unless specifically indicated in ROS section below Review of Systems  Constitutional:  Negative for fatigue and fever.  HENT:  Negative for congestion.   Eyes:  Negative for pain.  Respiratory:  Negative for cough and shortness of breath.   Cardiovascular:  Negative for chest pain, palpitations and leg swelling.  Gastrointestinal:   Negative for abdominal pain.  Genitourinary:  Negative for dysuria and vaginal bleeding.  Musculoskeletal:  Negative for back pain.  Neurological:  Negative for syncope, light-headedness and headaches.  Psychiatric/Behavioral:  Negative for dysphoric mood.    Objective:  BP 120/72   Pulse 78   Temp 97.7 F (36.5 C) (Temporal)   Ht 5' 3.5 (1.613 m)   Wt 151 lb 2 oz (68.5 kg)   SpO2 98%   BMI 26.35 kg/m   Wt Readings from Last 3 Encounters:  06/23/24 151 lb 2 oz (68.5 kg)  06/17/24 150 lb 6.4 oz (68.2 kg)  06/15/24 153 lb (69.4 kg)      Physical Exam Constitutional:      General: She is not in acute distress.    Appearance: Normal appearance. She is well-developed. She is not ill-appearing or toxic-appearing.  HENT:     Head: Normocephalic.     Right Ear: Hearing, tympanic membrane, ear canal and external ear normal. Tympanic membrane is not erythematous, retracted or bulging.     Left Ear: Hearing, tympanic membrane, ear canal and external ear normal. Tympanic membrane is not erythematous, retracted or bulging.     Nose: No mucosal edema or rhinorrhea.     Right Sinus: No maxillary sinus tenderness or frontal sinus tenderness.     Left Sinus: No maxillary sinus tenderness or frontal sinus tenderness.     Mouth/Throat:     Pharynx: Uvula midline.   Eyes:     General: Lids are normal. Lids are everted, no foreign bodies appreciated.     Conjunctiva/sclera: Conjunctivae normal.     Pupils: Pupils are equal, round, and reactive to light.   Neck:     Thyroid: No thyroid mass or thyromegaly.     Vascular: No carotid bruit.     Trachea: Trachea normal.   Cardiovascular:     Rate and Rhythm: Normal rate and regular rhythm.     Pulses: Normal pulses.     Heart sounds: Normal heart sounds, S1 normal and S2 normal. No murmur heard.    No friction rub. No gallop.  Pulmonary:     Effort: Pulmonary effort is normal. No tachypnea or respiratory distress.     Breath sounds:  Normal breath sounds. No decreased breath sounds, wheezing, rhonchi or rales.  Abdominal:     General: Bowel sounds are normal.     Palpations: Abdomen is soft.     Tenderness: There is no abdominal tenderness.   Musculoskeletal:     Cervical back: Normal range of motion and neck supple.   Skin:    General: Skin is warm and dry.     Findings: No rash.   Neurological:     Mental Status: She is alert.   Psychiatric:        Mood and Affect: Mood is not anxious or depressed.  Speech: Speech normal.        Behavior: Behavior normal. Behavior is cooperative.        Thought Content: Thought content normal.        Judgment: Judgment normal.       Results for orders placed or performed in visit on 06/22/24  CBC with Differential/Platelet   Collection Time: 06/22/24  3:10 PM  Result Value Ref Range   WBC 6.6 4.0 - 10.5 K/uL   RBC 4.10 3.87 - 5.11 Mil/uL   Hemoglobin 9.7 (L) 12.0 - 15.0 g/dL   HCT 69.4 (L) 63.9 - 53.9 %   MCV 74.3 (L) 78.0 - 100.0 fl   MCHC 31.7 30.0 - 36.0 g/dL   RDW 81.0 (H) 88.4 - 84.4 %   Platelets 326.0 150.0 - 400.0 K/uL   Neutrophils Relative % 53.5 43.0 - 77.0 %   Lymphocytes Relative 35.3 12.0 - 46.0 %   Monocytes Relative 8.0 3.0 - 12.0 %   Eosinophils Relative 2.1 0.0 - 5.0 %   Basophils Relative 1.1 0.0 - 3.0 %   Neutro Abs 3.5 1.4 - 7.7 K/uL   Lymphs Abs 2.3 0.7 - 4.0 K/uL   Monocytes Absolute 0.5 0.1 - 1.0 K/uL   Eosinophils Absolute 0.1 0.0 - 0.7 K/uL   Basophils Absolute 0.1 0.0 - 0.1 K/uL     COVID 19 screen:  No recent travel or known exposure to COVID19 The patient denies respiratory symptoms of COVID 19 at this time. The importance of social distancing was discussed today.   Assessment and Plan The patient's preventative maintenance and recommended screening tests for an annual wellness exam were reviewed in full today. Brought up to date unless services declined.  Counselled on the importance of diet, exercise, and its role in  overall health and mortality. The patient's FH and SH was reviewed, including their home life, tobacco status, and drug and alcohol status.   Vaccines:Consider PCV 20, Tdap, hep B. Pap/DVE:  2024 [place] Mammo: due Bone Density:not indicated Colon: 07/05/2022 per GI given Crohns Smoking Status:none ETOH/ drug use:occ/none  Hep C: done  HIV screen:    Problem List Items Addressed This Visit     Crohn's disease (HCC)   Chronic, followed by Dr. Leigh.      Relevant Orders   Magnesium    Essential hypertension   Chronic well-controlled  on atenolol 50 mg daily.         Immunosuppressed status (HCC)   On immunosuppressants for crohn's.      Iron  deficiency anemia due to chronic blood loss    Pending capsule endoscopy results for cause.  Gi planning Infusion.      Other Visit Diagnoses       Routine general medical examination at a health care facility    -  Primary     Rectal prolapse       Relevant Orders   Ambulatory referral to Urogynecology     Uterine prolapse       Relevant Orders   Ambulatory referral to Urogynecology     Female bladder prolapse       Relevant Orders   Ambulatory referral to Urogynecology     Recurrent UTI       Relevant Orders   Ambulatory referral to Urogynecology     Screening cholesterol level       Relevant Orders   Comprehensive metabolic panel with GFR   Lipid panel     Screening for diabetes mellitus  Relevant Orders   Hemoglobin A1c     Vitamin D  deficiency       Relevant Orders   VITAMIN D  25 Hydroxy (Vit-D Deficiency, Fractures)     B12 deficiency       Relevant Orders   Vitamin B12           Greig Ring, MD

## 2024-06-23 NOTE — Assessment & Plan Note (Addendum)
 Chronic well-controlled  on atenolol 50 mg daily.

## 2024-06-23 NOTE — Addendum Note (Signed)
 Addended by: WENDELL ARLAND RAMAN on: 06/23/2024 09:36 AM   Modules accepted: Orders

## 2024-06-23 NOTE — Assessment & Plan Note (Signed)
On immunosuppressants for crohn's.

## 2024-06-23 NOTE — Assessment & Plan Note (Signed)
 Pending capsule endoscopy results for cause.  Gi planning Infusion.

## 2024-06-23 NOTE — Telephone Encounter (Signed)
 Called the patient with capsule endoscopy results.  There is 1 small ulceration in the mid small bowel which there is no evidence of bleeding or heme near this area.  Unfortunately due to the her delayed transit the capsule timed out in the distal small bowel where the prep was poor. Overall an incomplete capsule study.  We discussed the findings.  I also reviewed her MR enterography with radiology that was uploaded into the system, done in [place] a few months ago.  There was no small bowel pathology and her Crohn's appears well-controlled.  No source for bleeding on that exam either.  She did have a follow-up x-ray already which showed that the capsule exited her small bowel intestine and colon  She continues to have pretty significant rectal bleeding, however her hemoglobin has been maintained over the past few blood draws.  I suspect this could be anorectal/hemorrhoidal.  She had pretty impressive hemorrhoids on her last colonoscopy.  This was thought to be the etiology in [place] based on her workup however she was not confident of that.  She had hemorrhoid banding which did not help.  Not sure if she is having hemorrhoids that are failing banding and if she needs surgery.  I told her the only way I can really confirm this is to do a repeat colonoscopy or flex sig.  She has had a very hard time with preps and is really not wanting to go through a procedure if at all possible given she has had 2 colonoscopies this year.  I think ultimately need to see her back in the office, do an anoscopy, see how inflamed the hemorrhoids are and we need to determine if she wants to try additional banding or consider surgery if this is not to be the cause.  Again based on her stable hemoglobin with her symptoms and findings on her last colonoscopy that we did, certainly could be hemorrhoidal.   Complicating her situation is the fact that her husband just had a stroke and she has been in the hospital with him.  Further,  she still is planning to go on a family vacation next week and will only have 1 more week in town before she leaves for [place].  I told her in order for us  to realistically fix this problem and further evaluate she will need to extend her stay in [place] .  If she wants us  to take care of this for her then she will need to extend the stay and make arrangements.  She will talked with her family about this.  I offered to see her in my clinic on July 15, the next time I have clinic when she is back in town.  She wanted to see me at that time to discuss her options.      Rock -can you please book this patient in my clinic on July 15, can overbook for a 4:15 appointment.  Thanks

## 2024-06-23 NOTE — Assessment & Plan Note (Signed)
 Chronic, followed by Dr. Leigh.

## 2024-06-23 NOTE — Patient Instructions (Signed)
 Set up mammogram Please call the location of your choice from the menu below to schedule your Mammogram and/or Bone Density appointment.    [facility]   [facility]                      Phone:  [phone] [address]                               [place], [place] [address]                                                             Services: Traditional and 3D Mammogram, Bone Density   [facility]                 Phone: [phone] [address]                                                       [place], [place] [address]    Service: Bone Density ONLY   *this site does NOT perform mammograms  [facility] Mamm[facility]                        Phone:  [phone] [address]                                  [place], [place] [address]                                            Services:  3D Mammogram and Bone Density    [place] [facility]   Phone:  [phone]   [address]                                                                            [place], [place] [address]                                            Services: 3D Mammogram and Bone [facility]  Phone:  [phone]   [address]                        [place], [place] [address]  Services:  3D Mammogram and Bone Density

## 2024-06-24 ENCOUNTER — Other Ambulatory Visit: Payer: Self-pay | Admitting: Family Medicine

## 2024-06-24 MED ORDER — CYANOCOBALAMIN 1000 MCG/ML IJ SOLN: INTRAMUSCULAR | 1 refills | Status: AC

## 2024-06-24 MED ORDER — VITAMIN D3 1.25 MG (50000 UT) PO CAPS: 1.0000 | ORAL_CAPSULE | ORAL | 3 refills | Status: AC

## 2024-06-24 NOTE — Telephone Encounter (Signed)
 Sammie forgot to attach medications the doctor recommended:  Mounjaro/Zepbound generic formula  is tirazepide.  You'll want the Zepboud Ozempic/Wegovy generic is semaglutide.  Wegovy is for weight loss.

## 2024-06-24 NOTE — Telephone Encounter (Signed)
 Pt scheduled to see Dr Leigh 07/07/24 at 4:20pm. Pt notified of appt via mychart.

## 2024-06-25 ENCOUNTER — Telehealth: Payer: Self-pay

## 2024-06-25 ENCOUNTER — Encounter: Payer: Self-pay | Admitting: Gastroenterology

## 2024-06-25 ENCOUNTER — Other Ambulatory Visit (HOSPITAL_COMMUNITY): Payer: Self-pay

## 2024-06-25 MED ORDER — SEMAGLUTIDE-WEIGHT MANAGEMENT 0.25 MG/0.5ML ~~LOC~~ SOAJ: 0.2500 mg | SUBCUTANEOUS | 0 refills | Status: DC

## 2024-06-25 NOTE — Telephone Encounter (Signed)
 Pharmacy Patient Advocate Encounter   Received notification from Onbase that prior authorization for [facility] 0.25MG /0.5ML auto-injectors is required/requested.   Insurance verification completed.   The patient is insured through [employer] .   Per test claim: PA required; PA submitted to above mentioned insurance via CoverMyMeds Key/confirmation #/EOC [facility] Status is pending

## 2024-06-29 ENCOUNTER — Encounter

## 2024-06-29 NOTE — Telephone Encounter (Signed)
 Can we submit an appeal with the below information:     2. The preauthorization for the shot that was suggested for my Chron's, prediabetes, hEDS syndrome joint pain/stiffness and fatigue was denied because the insurance read it as a medicine for weight therapy and my BMI wasn't high enough. Can you put another preauthorization in and maybe change the wording so they can get that approved and called in before we have to go back to [place] on the 19th. That's a shot that could help me become more mobile which would be life changing.    Any help is appreciated.

## 2024-06-29 NOTE — Telephone Encounter (Signed)
 Pharmacy Patient Advocate Encounter  Received notification from [employer] that Prior Authorization for Wegovy  0.25MG /0.5ML auto-injectors  has been DENIED.  Full denial letter will be uploaded to the media tab. See denial reason below.    PA #/Case ID/Reference #: [id number]

## 2024-07-01 ENCOUNTER — Telehealth: Payer: Self-pay | Admitting: Pharmacist

## 2024-07-01 NOTE — Telephone Encounter (Signed)
 This case does not currently qualify for an appeal. Wegovy  is not FDA-approved for the treatment of Crohn's disease, prediabetes, or hypermobile Ehlers-Danlos syndrome (hEDS), nor is there sufficient published clinical trial data to support its use for these conditions.  Thank you, Devere Pandy, PharmD Clinical Pharmacist  [place]  Direct Dial: [phone]

## 2024-07-02 MED ORDER — SEMAGLUTIDE-WEIGHT MANAGEMENT 0.25 MG/0.5ML ~~LOC~~ SOAJ: 0.2500 mg | SUBCUTANEOUS | 0 refills | Status: AC

## 2024-07-02 NOTE — Addendum Note (Signed)
 Addended by: WENDELL ARLAND RAMAN on: 07/02/2024 08:14 AM   Modules accepted: Orders

## 2024-07-03 ENCOUNTER — Encounter: Payer: Self-pay | Admitting: Gastroenterology

## 2024-07-06 NOTE — Progress Notes (Unsigned)
 HPI :  CROHNS HISTORY 48 y/o female with ileocolonic stricturing / perforating Crohn's disease   Ileocolonic Crohns - diagnosed in 2002. She reported she presented with a bowel obstruction with perforation and had surgery upon diagnosis and had 2 surgeries within the first year of diagnosis. She reports a history of abscess formation and fistula development (enteroenteral fistulas?). She was eventually placed on Remicade , only for a short period of time, perhaps 1-2 infusions - she was uncertain if it worked or not, but stopped it for unclear reasons. She had been on 6MP or steroids over time otherwise, she states she has intermittently been on . She has been on mesalamines in the past which did not help. She has been treated for bacterial overgrowth over time given ileocecectomy. She has had 2 surgeries total for her Crohns. She has been previously treated with Humira  which appeared to put her disease into remission, however developed severe arthralgias, evaluated by Rheumatology who thought it was related to Humira , which was stopped and her symptoms resolved. She was then given a dose of Remicade  and developed a serum sickness which led to hospital admission, treated with steroids.    She otherwise has a history of uveitis and iritis with Cronhns flares. She reports she also has significant back pain and arthralgias due to Crohns flares. She takes tramadol  for pain, does not NSAIDs.   Her pregnancy was complicated by PE x 2. Was on coumadin  but now off all anticoagulants. She has been on IV iron  in the past.  She has been on Entyvio  since June 2017, living in [place] in recent years.     SINCE THE LAST VISIT:   48 year old female here for a follow-up visit.  I saw her on June 25 when she returned from [place] for assessment of anemia and rectal bleeding.  See that note for full visit details.  Extensive workup for rectal bleeding and anemia.  She has had intermittent bleeding for about a year now.   Status post significant workup overseas, including EGD, a few colonoscopies as well as CTA, MR enterography.  She has had a few colonoscopies this year done in [place] without a clear cause for her symptoms.  Apparently some anastomotic ulcerations but not thought to be the cause of her symptoms.  It sounds like her workup over there has led to diagnosis of likely hemorrhoidal bleeding although she had concerns about this being the true cause.  She did have hemorrhoid banding x 2 there which she did not think helped too much.    She continues to have rectal bleeding and about 80% of her stools with red to burgundy colored blood.  Her last hemoglobin on June 30 was 9.7.  We had tried to get her IV iron  infusions which is scheduled for later this week.  We performed a capsule endoscopy for her to reassess her small intestine as she has not had that recently.  There is 1 small ulceration in the mid small bowel which there is no evidence of bleeding or heme near this area.  Unfortunately due to the her delayed transit the capsule timed out in the distal small bowel where the prep was poor. Overall an incomplete capsule study.  Follow-up x-ray showed no retained capsule.  I did sit down with the radiologist and reviewed her MR enterography and CTA with them, they did not see any clear cause for her bleeding.  There was no evidence of active Crohn's disease on those exams  Recall over the past several months she has required 9 units of blood for transfusions for her anemia with IV iron , hemoglobin has dropped to the fives at times.  [place] has been in [place]  [place] roughly 3 weeks.  I saw her the first week she was here.  Unfortunately after that visit her husband had a stroke and was inpatient at [facility] for some time in the ICU and then to the rehab inpatient unit.  He is fortunately recovering.  This has been a very stressful time for her.  She states she continues to have bleeding.  They are  supposed to return to [place] later this week and she may need to move to [place] if her husband excepts a new job where she would receive care at [facility] base who has GI support and surgical support etc.  She does not think she has time to get IV iron  infusions this week before she leaves for [place].  She is scheduled for this Thursday.  She takes Linzess  for her bowels, this works well to keep her bowels moving.  She is also on Entyvio  every 2 weeks via subcu injection    Prior workup here: EGD 12/14/16 - 1cm hiatal hernia, otherwise normal exam, benign stomach polyps fundic gland Colonoscopy 12/14/16 - normal colon and ileum, few small ulcers at anastomosis - normal colon bx Esophageal manometry 12/2016 - normal 24 HR pH impedance testing - Demeester score of 4.1, 41 symptoms reported, symptom index of 12% for reflux     Colonoscopy 09/2021 - [place] - reported Crohn's in remission - widely patent anastomosis and normal bowel?   MRE enterography 10/28/21 - [place] no dilated loops of small bowel or pathologic abnormalities       S/p Lap chole July 2023   CT abdomen / pelvis 06/27/22: IMPRESSION: 1. There is a 8 cm segment of proximal transverse colon which appears partially decompressed and exhibits mild wall thickening. There is also mild wall thickening and mucosal enhancement involving the sigmoid colon. Findings are compatible with multifocal segmental mild colitis. No signs to suggest penetrating disease, abscess or bowel obstruction. 2. Small hiatal hernia.     Workup in [place] - review of records:   Bleeding / anemia March-June 2024 Recurrence Feb-April 2025   03/10/2024 - Hgb 5.5 - 3 units PRBC   Colonoscopy 2024 - perianastomotic ulcers without active IBD, hemorrhoids VCE 2024 - small bowel diverticulum MRE 2024 - delayed transit? Another colonoscopy 2024 - normal EGD 2024 - gastric polyp removed - no details provided Colonoscopy 03/12/2024 - normal, grade III hemorrhoids    CT scan abdomen / pelvis 04/29/24 - dilated right colon to 6.7cm, anastomosis normal, diverticulosis   CT scan abdomen pelvis 05/12/24 - benign hepatic cyst, small hiatal hernia, left adnexal cyst    Capsule endoscopy here - 06/19/24 - there is 1 small ulceration in the mid small bowel which there is no evidence of bleeding or heme near this area.  Unfortunately due to the her delayed transit the capsule timed out in the distal small bowel where the prep was poor. Overall an incomplete capsule study.      Past Medical History:  Diagnosis Date   Anemia    has gotten iron  transfusions in past   Anxiety    Asthma    when she gets sick with bronchitis   Chest pain    a. s/p intermediate risk nuclear stress test on 09/23/14 and normal LHC on 09/24/14  Chronic nausea    Colonic inertia    Crohn disease (HCC) 2000   Diverticulitis    Dysautonomia (HCC)    recurrent syncope s/p ILR by Dr Tisa   Endometriosis    Essential hypertension 03/17/2017   a - Renal artery US  3/18: normal     GERD (gastroesophageal reflux disease)    H/O hiatal hernia    History of kidney stones    surg to remove kidney stones and passed stones   Migraine    last one 04/2022   Palpitations    past hx - stress related   PONV (postoperative nausea and vomiting)    scop patch in past   Pulmonary embolism affecting pregnancy    Vertigo      Past Surgical History:  Procedure Laterality Date   79 HOUR PH STUDY N/A 12/31/2016   Procedure: 24 HOUR PH STUDY;  Surgeon: Elspeth Deward Naval, MD;  Location: [facility];  Service: Gastroenterology;  Laterality: N/A;   ANTERIOR CERVICAL DECOMP/DISCECTOMY FUSION N/A 07/19/2017   Procedure: Cervical five-six Anterior cervical decompression/discectomy/fusion;  Surgeon: Unice Pac, MD;  Location: [facility] OR;  Service: Neurosurgery;  Laterality: N/A;  C5-6 Anterior cervical decompression/discectomy/fusion   APPENDECTOMY  ~ 1992   c/s with tubal ligation  06/03/2015    CESAREAN SECTION  2011   emergency  CS due to placental abruption   CESAREAN SECTION  2014   preeclampsia   CHOLECYSTECTOMY N/A 07/12/2022   Procedure: LAPAROSCOPIC CHOLECYSTECTOMY;  Surgeon: Dasie Leonor CROME, MD;  Location: [facility] OR;  Service: General;  Laterality: N/A;   COLON SURGERY     COLONOSCOPY  07/05/2022   CYSTOSCOPY W/ STONE MANIPULATION  X 2   ENDOSCOPIC RELEASE TRANSVERSE CARPAL LIGAMENT OF HAND Right 12/2010   ESOPHAGEAL MANOMETRY N/A 12/31/2016   Procedure: ESOPHAGEAL MANOMETRY (EM);  Surgeon: Elspeth Deward Naval, MD;  Location: [facility];  Service: Gastroenterology;  Laterality: N/A;   GASTROCNEMIUS RECESSION Right 07/08/2020   Procedure: GASTROCNEMIUS SLIDE;  Surgeon: Beverley Evalene BIRCH, MD;  Location: [facility];  Service: Orthopedics;  Laterality: Right;   HARDWARE REMOVAL Right 07/08/2020   Procedure: HARDWARE REMOVAL;  Surgeon: Beverley Evalene BIRCH, MD;  Location: [facility];  Service: Orthopedics;  Laterality: Right;   ILEOCECETOMY  2002 X 2   KNEE ARTHROSCOPY Bilateral 1988-1990   LAPAROSCOPY FOR ECTOPIC PREGNANCY  11/2012   LEFT HEART CATHETERIZATION WITH CORONARY ANGIOGRAM N/A 09/24/2014   Procedure: LEFT HEART CATHETERIZATION WITH CORONARY ANGIOGRAM;  Surgeon: Alm LELON Clay, MD;  Location: [facility] CATH LAB;  Service: Cardiovascular;  Laterality: N/A;   LOOP RECORDER IMPLANT  11/2009   by DR Tisa   LOOP RECORDER REMOVAL N/A 07/05/2017   Procedure: Loop Recorder Removal;  Surgeon: Waddell Danelle LELON, MD;  Location[facility]AS[facility] CV LAB;  Service: Cardiovascular;  Laterality: N/A;   NASAL SINUS SURGERY  ~ 2007   OPEN REDUCTION INTERNAL FIXATION (ORIF) FOOT LISFRANC FRACTURE Right 07/18/2018   Procedure: OPEN REDUCTION INTERNAL FIXATION (ORIF) RIGHT TARSAL METATARSAL FRACTURE and DEPO INJECTION;  Surgeon: Beverley Evalene BIRCH, MD;  Location: [facility];  Service: Orthopedics;  Laterality: Right;   PARTIAL COLECTOMY  2002   partial  removed due to crohns   TONSILLECTOMY  ~ 1996   UPPER GI ENDOSCOPY  07/05/2022   US  ECHOCARDIOGRAPHY  11/11/2009   EF 55-60%   Family History  Problem Relation Age of Onset   Hypertension Mother    Hyperlipidemia Mother    Arthritis  Mother    Hyperlipidemia Brother    Hypertension Brother    Ulcerative colitis Brother    Esophageal cancer Neg Hx    Stomach cancer Neg Hx    Rectal cancer Neg Hx    Social History   Tobacco Use   Smoking status: Never   Smokeless tobacco: Never  Vaping Use   Vaping status: Never Used  Substance Use Topics   Alcohol use: Yes    Alcohol/week: 2.0 standard drinks of alcohol    Types: 2 Glasses of wine per week    Comment: social   Drug use: No   Current Outpatient Medications  Medication Sig Dispense Refill   albuterol  (VENTOLIN  HFA) 108 (90 Base) MCG/ACT inhaler Inhale 2 puffs into the lungs every 6 (six) hours as needed (bronchitis). 8 g 0   ALPRAZolam  (XANAX ) 0.5 MG tablet Take 1 tablet (0.5 mg total) by mouth daily as needed for anxiety. 30 tablet 0   atenolol  (TENORMIN ) 50 MG tablet Take 1 tablet (50 mg total) by mouth daily. 90 tablet 0   butalbital -acetaminophen -caffeine  (FIORICET ) 50-325-40 MG tablet TAKE TWO CAPSULES BY MOUTH EVERY 6 HOURS AS NEEDED FOR MIGRAINES 60 tablet 0   cetirizine  (ZYRTEC ) 10 MG tablet Take 1 tablet (10 mg total) by mouth at bedtime. 90 tablet 0   Cholecalciferol (VITAMIN D3) 1.25 MG (50000 UT) CAPS Take 1 capsule (1.25 mg total) by mouth once a week. 12 capsule 3   cyanocobalamin  (VITAMIN B12) 1000 MCG/ML injection 1000 mcg inj SQ weekly x 4 week then monthly x 1 year 30 mL 1   dexlansoprazole  (DEXILANT ) 60 MG capsule Take 1 capsule (60 mg total) by mouth daily. 90 capsule 0   escitalopram  (LEXAPRO ) 20 MG tablet Take 1 tablet (20 mg total) by mouth daily. 90 tablet 0   guaiFENesin -codeine  100-10 MG/5ML syrup Take 5 mLs by mouth every 6 (six) hours as needed for cough. 100 mL 0   leuprolide (LUPRON) 11.25 MG  injection Inject 11.25 mg into the muscle every 3 (three) months.     linaclotide  (LINZESS ) 290 MCG CAPS capsule Take 1 capsule (290 mcg total) by mouth daily before breakfast. 90 capsule 0   montelukast  (SINGULAIR ) 10 MG tablet Take 1 tablet (10 mg total) by mouth at bedtime. 90 tablet 0   predniSONE  (DELTASONE ) 10 MG tablet 3 tabs by mouth daily x 3 days, then 2 tabs by mouth daily x 2 days then 1 tab by mouth daily x 2 days 15 tablet 0   Semaglutide -Weight Management 0.25 MG/0.5ML SOAJ Inject 0.25 mg into the skin once a week. 6 mL 0   Vedolizumab  (ENTYVIO  PEN) 108 MG/0.68ML SOAJ Inject 108 mg into the skin every 14 (fourteen) days.     zolpidem  (AMBIEN ) 10 MG tablet Take 1 tablet (10 mg total) by mouth at bedtime. 90 tablet 0   No current facility-administered medications for this visit.   Allergies  Allergen Reactions   Clarithromycin Other (See Comments)    Other reaction(s): Bleeding Cant take due to Chrons disease Other reaction(s): Bleeding Other reaction(s): Bleeding    Metoclopramide  Diarrhea and Nausea And Vomiting   Remicade  [Infliximab ] Other (See Comments)    Serum Sickness  Couldn't walk, open mouth, pain,    Sodium Ferric Gluconate [Ferrous Gluconate] Nausea And Vomiting and Swelling   Sulfa Antibiotics Hives and Other (See Comments)    Hematemesis; documented while a young child     Sulfamethoxazole Rash and Other (See Comments)    Internal  bleeding and kidney infection   Doxycycline  Nausea And Vomiting   Nitrofurantoin Nausea And Vomiting    Other reaction(s): GI Upset (intolerance)   Promethazine Other (See Comments)    muscle twitches    Ceftin  [Cefuroxime  Axetil] Other (See Comments)    dizziness   Compazine  [Prochlorperazine ]     Muscle spasm   Prochlorperazine  Edisylate Nausea And Vomiting    Muscle spasm     Review of Systems: All systems reviewed and negative except where noted in HPI.   Lab Results  Component Value Date   WBC 6.6  06/22/2024   HGB 9.7 (L) 06/22/2024   HCT 30.5 (L) 06/22/2024   MCV 74.3 (L) 06/22/2024   PLT 326.0 06/22/2024    Lab Results  Component Value Date   IRON  72 06/18/2024   TIBC 518.0 (H) 06/18/2024   FERRITIN 5.0 (L) 06/18/2024     Physical Exam: BP 114/74   Pulse 94   Ht 5' 4 (1.626 m)   Wt 152 lb (68.9 kg)   BMI 26.09 kg/m  Constitutional: Pleasant,well-developed, female in no acute distress. DRE / anoscopy - CMA Madison Favre standby - inflamed hemorrhoids in the LL and RP areas, no mass lesions Neurological: Alert and oriented to person place and time. Psychiatric: Normal mood and affect. Behavior is normal.   ASSESSMENT / PLAN: 48 y.o. female here for assessment of the following  1. Rectal bleeding   2. Grade II hemorrhoids   3. Iron  deficiency anemia, unspecified iron  deficiency anemia type   4. Crohn's disease of both small and large intestine with other complication (HCC)    As above, extensive workup in [place] for recurrent rectal bleeding and severe IDA leading to blood transfusions and IV iron  in the past.  We performed a capsule endoscopy here to fully clear her small intestine.  Due to dysmotility of her right colon, the prep was not good and the study was incomplete as it timed out in her small intestine.  This was unfortunately an incomplete study but there was no blood seen anywhere in her small bowel.  She does have inflamed hemorrhoids on exam, her team in [place] had thought she was having recurrent hemorrhoidal bleeding causing her anemia and given the fact of how often she is seeing the blood with relatively stable hemoglobin while she has been here I think anorectal bleeding from hemorrhoids is the most likely etiology causing her symptoms.  I counseled her I cannot be 100% certain, without performing another colonoscopy here myself, but it sounds like she has had several in [place] and have not found a clear cause otherwise.  She has failed hemorrhoid banding and  if hemorrhoids are causing her symptoms then she is to be evaluated for hemorrhoidectomy.  We discussed what the surgery entails, this would be rather difficult for her to recover in the setting of her husband's health however I think probably the likely next step to resolve her rectal bleeding.  She needs to decide with her husband where they are going to live in light of his recent CVA (either remain in [place] where she is plugged in with a Careers adviser or moved to [place] where she can get care [place] the base [place]), and will further evaluate for hemorrhoidectomy when she gets there and settled.  In the interim she should continue with IV iron  as soon as she gets back to [place] per their protocol for repletion and have close monitoring of her CBC in the setting of ongoing  rectal bleeding to make sure she does not need transfusions.  Her hemoglobin has been stable in the nines since she has been here.  Otherwise continue Entyvio  for her Crohn's disease which is working well, she likes the Entyvio  injections and would continue with that. I think her Crohn's is well controlled at present time.  She will keep me updated using MyChart from [place], am happy to answer any questions as best I can while she remains there.  She can follow-up with me as needed if when she is back in [place]  in the future.  Marcey Naval, MD [facility]astro[facility]

## 2024-07-07 ENCOUNTER — Encounter

## 2024-07-07 ENCOUNTER — Other Ambulatory Visit: Payer: Self-pay | Admitting: Family Medicine

## 2024-07-07 ENCOUNTER — Ambulatory Visit: Admitting: Gastroenterology

## 2024-07-07 ENCOUNTER — Encounter: Payer: Self-pay | Admitting: Gastroenterology

## 2024-07-07 VITALS — BP 114/74 | HR 94 | Ht 64.0 in | Wt 152.0 lb

## 2024-07-07 DIAGNOSIS — K641 Second degree hemorrhoids: Secondary | ICD-10-CM

## 2024-07-07 DIAGNOSIS — D509 Iron deficiency anemia, unspecified: Secondary | ICD-10-CM | POA: Diagnosis not present

## 2024-07-07 DIAGNOSIS — K625 Hemorrhage of anus and rectum: Secondary | ICD-10-CM

## 2024-07-07 DIAGNOSIS — K50818 Crohn's disease of both small and large intestine with other complication: Secondary | ICD-10-CM

## 2024-07-07 MED ORDER — GUAIFENESIN-CODEINE 100-10 MG/5ML PO SOLN: 5.0000 mL | Freq: Four times a day (QID) | ORAL | 0 refills | Status: AC | PRN

## 2024-07-07 MED ORDER — PREDNISONE 10 MG PO TABS: ORAL_TABLET | ORAL | 0 refills | Status: AC

## 2024-07-07 MED ORDER — DEXLANSOPRAZOLE 60 MG PO CPDR: 60.0000 mg | DELAYED_RELEASE_CAPSULE | Freq: Every day | ORAL | 0 refills | Status: AC

## 2024-07-07 MED ORDER — ZOLPIDEM TARTRATE 10 MG PO TABS: 10.0000 mg | ORAL_TABLET | Freq: Every day | ORAL | 0 refills | Status: AC

## 2024-07-07 MED ORDER — CETIRIZINE HCL 10 MG PO TABS: 10.0000 mg | ORAL_TABLET | Freq: Every day | ORAL | 0 refills | Status: AC

## 2024-07-07 MED ORDER — MONTELUKAST SODIUM 10 MG PO TABS: 10.0000 mg | ORAL_TABLET | Freq: Every day | ORAL | 0 refills | Status: AC

## 2024-07-07 MED ORDER — BUTALBITAL-APAP-CAFFEINE 50-325-40 MG PO TABS: ORAL_TABLET | ORAL | 0 refills | Status: AC

## 2024-07-07 MED ORDER — ESCITALOPRAM OXALATE 20 MG PO TABS: 20.0000 mg | ORAL_TABLET | Freq: Every day | ORAL | 0 refills | Status: AC

## 2024-07-07 MED ORDER — ALBUTEROL SULFATE HFA 108 (90 BASE) MCG/ACT IN AERS: 2.0000 | INHALATION_SPRAY | Freq: Four times a day (QID) | RESPIRATORY_TRACT | 0 refills | Status: AC | PRN

## 2024-07-07 MED ORDER — LINACLOTIDE 290 MCG PO CAPS: 290.0000 ug | ORAL_CAPSULE | Freq: Every day | ORAL | 0 refills | Status: AC

## 2024-07-07 MED ORDER — ALPRAZOLAM 0.5 MG PO TABS: 0.5000 mg | ORAL_TABLET | Freq: Every day | ORAL | 0 refills | Status: AC | PRN

## 2024-07-07 MED ORDER — ATENOLOL 50 MG PO TABS: 50.0000 mg | ORAL_TABLET | Freq: Every day | ORAL | 0 refills | Status: AC

## 2024-07-07 NOTE — Addendum Note (Signed)
 Addended by: WENDELL ARLAND RAMAN on: 07/07/2024 11:13 AM   Modules accepted: Orders

## 2024-07-07 NOTE — Patient Instructions (Signed)
   Thank you for entrusting me with your care and for choosing [facility], Dr. Elspeth Naval    If your blood pressure at your visit was 140/90 or greater, please contact your primary care physician to follow up on this. ______________________________________________________  If you are age 48 or older, your body mass index should be between 23-30. Your Body mass index is 26.09 kg/m. If this is out of the aforementioned range listed, please consider follow up with your Primary Care Provider.  If you are age 2 or younger, your body mass index should be between 19-25. Your Body mass index is 26.09 kg/m. If this is out of the aformentioned range listed, please consider follow up with your Primary Care Provider.  ________________________________________________________  The [place] [facility] providers would like to encourage you to use MYCHART to communicate with providers for non-urgent requests or questions.  Due to long hold times on the telephone, sending your provider a message by [facility] may be a faster and more efficient way to get a response.  Please allow 48 business hours for a response.  Please remember that this is for non-urgent requests.  _______________________________________________________  Due to recent changes in healthcare laws, you may see the results of your imaging and laboratory studies on MyChart before your provider has had a chance to review them.  We understand that in some cases there may be results that are confusing or concerning to you. Not all laboratory results come back in the same time frame and the provider may be waiting for multiple results in order to interpret others.  Please give us  48 hours in order for your provider to thoroughly review all the results before contacting the office for clarification of your results.

## 2024-07-07 NOTE — Telephone Encounter (Addendum)
 Last office visit 06/23/24 for CPE.  Last refilled ?  No future appointments.

## 2024-07-07 NOTE — Telephone Encounter (Signed)
 Last office visit 06/23/24 for CPE.  Last refilled zolpidem  06/18/2022 for #90 with no refills.  Fioricet  09/17/2017 for #60 with 1 refill.  No future appointments.

## 2024-07-07 NOTE — Addendum Note (Signed)
 Addended by: LEIGH ELSPETH SQUIBB on: 07/07/2024 05:33 PM   Modules accepted: Level of Service

## 2024-07-09 ENCOUNTER — Ambulatory Visit

## 2024-07-09 ENCOUNTER — Encounter: Payer: Self-pay | Admitting: Family Medicine

## 2024-07-09 ENCOUNTER — Ambulatory Visit (INDEPENDENT_AMBULATORY_CARE_PROVIDER_SITE_OTHER): Admitting: Family Medicine

## 2024-07-09 VITALS — BP 120/80 | HR 73 | Temp 98.1°F | Ht 63.5 in | Wt 152.4 lb

## 2024-07-09 DIAGNOSIS — H65192 Other acute nonsuppurative otitis media, left ear: Secondary | ICD-10-CM | POA: Insufficient documentation

## 2024-07-09 DIAGNOSIS — J4541 Moderate persistent asthma with (acute) exacerbation: Secondary | ICD-10-CM

## 2024-07-09 DIAGNOSIS — E538 Deficiency of other specified B group vitamins: Secondary | ICD-10-CM

## 2024-07-09 MED ORDER — SYRINGE 25G X 1" 3 ML MISC: 0 refills | Status: AC

## 2024-07-09 MED ORDER — AZITHROMYCIN 250 MG PO TABS: ORAL_TABLET | ORAL | 0 refills | Status: AC

## 2024-07-09 NOTE — Progress Notes (Signed)
 Patient ID: [name], female    DOB: 14-Nov-1976, 48 y.o.   MRN: [id number]  This visit was conducted in person.  BP 120/80   Pulse 73   Temp 98.1 F (36.7 C) (Temporal)   Ht 5' 3.5 (1.613 m)   Wt 152 lb 6 oz (69.1 kg)   SpO2 98%   BMI 26.57 kg/m    CC:  Chief Complaint  Patient presents with   Asthma   Ear Pain   Nasal Congestion    Subjective:   HPI: [name] is a 48 y.o. female presenting on 07/09/2024 for Asthma, Ear Pain, and Nasal Congestion   Date of onset: 1 week ago Initial symptoms included moist cough Symptoms progressed to wheeze, SOB  No fever.  Left ear pain, bilateral face pain.   Mucus turned yellow in last few days.  She called on July 15 and I provided prednisone  taper and guaifenesin /codeine  cough syrup as needed for cough.  She has started to improve..  less wheeze and less cough.. now sleeping at nioght.   Sick contacts:  no.. recent trip to beach COVID testing:   none   Planning to go back to [place] then husband will transfer to [place].   She has tried to treat with albuterol  as needed and  Singulair .  Betamathasol./ fomterol 2 per day.    History of moderate persistent asthma Non-smoker.       Relevant past medical, surgical, family and social history reviewed and updated as indicated. Interim medical history since our last visit reviewed. Allergies and medications reviewed and updated. Outpatient Medications Prior to Visit  Medication Sig Dispense Refill   BECLOMETHASONE DIPROP HFA IN Inhale 200 mcg into the lungs daily as needed.     albuterol  (VENTOLIN  HFA) 108 (90 Base) MCG/ACT inhaler Inhale 2 puffs into the lungs every 6 (six) hours as needed (bronchitis). 8 g 0   ALPRAZolam  (XANAX ) 0.5 MG tablet Take 1 tablet (0.5 mg total) by mouth daily as needed for anxiety. 30 tablet 0   atenolol  (TENORMIN ) 50 MG tablet Take 1 tablet (50 mg total) by mouth daily. 90 tablet 0   butalbital -acetaminophen -caffeine  (FIORICET )  50-325-40 MG tablet TAKE TWO CAPSULES BY MOUTH EVERY 6 HOURS AS NEEDED FOR MIGRAINES 60 tablet 0   cetirizine  (ZYRTEC ) 10 MG tablet Take 1 tablet (10 mg total) by mouth at bedtime. 90 tablet 0   Cholecalciferol (VITAMIN D3) 1.25 MG (50000 UT) CAPS Take 1 capsule (1.25 mg total) by mouth once a week. 12 capsule 3   cyanocobalamin  (VITAMIN B12) 1000 MCG/ML injection 1000 mcg inj SQ weekly x 4 week then monthly x 1 year 30 mL 1   dexlansoprazole  (DEXILANT ) 60 MG capsule Take 1 capsule (60 mg total) by mouth daily. 90 capsule 0   escitalopram  (LEXAPRO ) 20 MG tablet Take 1 tablet (20 mg total) by mouth daily. 90 tablet 0   guaiFENesin -codeine  100-10 MG/5ML syrup Take 5 mLs by mouth every 6 (six) hours as needed for cough. 100 mL 0   leuprolide (LUPRON) 11.25 MG injection Inject 11.25 mg into the muscle every 3 (three) months.     linaclotide  (LINZESS ) 290 MCG CAPS capsule Take 1 capsule (290 mcg total) by mouth daily before breakfast. 90 capsule 0   montelukast  (SINGULAIR ) 10 MG tablet Take 1 tablet (10 mg total) by mouth at bedtime. 90 tablet 0   predniSONE  (DELTASONE ) 10 MG tablet 3 tabs by mouth daily x 3 days, then 2  tabs by mouth daily x 2 days then 1 tab by mouth daily x 2 days 15 tablet 0   Semaglutide -Weight Management 0.25 MG/0.5ML SOAJ Inject 0.25 mg into the skin once a week. (Patient not taking: Reported on 07/09/2024) 6 mL 0   Vedolizumab  (ENTYVIO  PEN) 108 MG/0.68ML SOAJ Inject 108 mg into the skin every 14 (fourteen) days.     zolpidem  (AMBIEN ) 10 MG tablet Take 1 tablet (10 mg total) by mouth at bedtime. 90 tablet 0   No facility-administered medications prior to visit.     Per HPI unless specifically indicated in ROS section below Review of Systems  Constitutional:  Negative for fatigue and fever.  HENT:  Positive for ear pain. Negative for congestion.   Eyes:  Negative for pain.  Respiratory:  Negative for cough and shortness of breath.   Cardiovascular:  Negative for chest pain,  palpitations and leg swelling.  Gastrointestinal:  Negative for abdominal pain.  Genitourinary:  Negative for dysuria and vaginal bleeding.  Musculoskeletal:  Negative for back pain.  Neurological:  Negative for syncope, light-headedness and headaches.  Psychiatric/Behavioral:  Negative for dysphoric mood.    Objective:  BP 120/80   Pulse 73   Temp 98.1 F (36.7 C) (Temporal)   Ht 5' 3.5 (1.613 m)   Wt 152 lb 6 oz (69.1 kg)   SpO2 98%   BMI 26.57 kg/m   Wt Readings from Last 3 Encounters:  07/09/24 152 lb 6 oz (69.1 kg)  07/07/24 152 lb (68.9 kg)  06/23/24 151 lb 2 oz (68.5 kg)      Physical Exam Constitutional:      General: She is not in acute distress.    Appearance: She is well-developed. She is not ill-appearing or toxic-appearing.  HENT:     Head: Normocephalic.     Right Ear: Hearing, tympanic membrane, ear canal and external ear normal. No middle ear effusion. Tympanic membrane is not injected, scarred, perforated, erythematous, retracted or bulging.     Left Ear: Hearing, ear canal and external ear normal. A middle ear effusion is present. Tympanic membrane is bulging. Tympanic membrane is not injected, scarred, perforated, erythematous or retracted.     Nose: Mucosal edema and rhinorrhea present.     Right Sinus: No maxillary sinus tenderness or frontal sinus tenderness.     Left Sinus: No maxillary sinus tenderness or frontal sinus tenderness.     Mouth/Throat:     Pharynx: Uvula midline.  Eyes:     General: Lids are normal. Lids are everted, no foreign bodies appreciated.     Conjunctiva/sclera: Conjunctivae normal.     Pupils: Pupils are equal, round, and reactive to light.  Neck:     Thyroid: No thyroid mass or thyromegaly.     Vascular: No carotid bruit.     Trachea: Trachea normal.  Cardiovascular:     Rate and Rhythm: Normal rate and regular rhythm.     Pulses: Normal pulses.     Heart sounds: Normal heart sounds, S1 normal and S2 normal. No murmur  heard.    No friction rub. No gallop.  Pulmonary:     Effort: Pulmonary effort is normal. No tachypnea or respiratory distress.     Breath sounds: Normal breath sounds. No decreased breath sounds, wheezing, rhonchi or rales.  Musculoskeletal:     Cervical back: Normal range of motion and neck supple.  Skin:    General: Skin is warm and dry.     Findings: No rash.  Neurological:     Mental Status: She is alert.  Psychiatric:        Mood and Affect: Mood is not anxious or depressed.        Speech: Speech normal.        Behavior: Behavior normal. Behavior is cooperative.        Judgment: Judgment normal.       Results for orders placed or performed in visit on 06/23/24  Comprehensive metabolic panel with GFR   Collection Time: 06/23/24  9:16 AM  Result Value Ref Range   Sodium 139 135 - 145 mEq/L   Potassium 4.5 3.5 - 5.1 mEq/L   Chloride 105 96 - 112 mEq/L   CO2 27 19 - 32 mEq/L   Glucose, Bld 100 (H) 70 - 99 mg/dL   BUN 15 6 - 23 mg/dL   Creatinine, Ser 9.24 0.40 - 1.20 mg/dL   Total Bilirubin 0.3 0.2 - 1.2 mg/dL   Alkaline Phosphatase 86 39 - 117 U/L   AST 17 0 - 37 U/L   ALT 17 0 - 35 U/L   Total Protein 7.0 6.0 - 8.3 g/dL   Albumin 4.5 3.5 - 5.2 g/dL   GFR 05.87 >39.99 mL/min   Calcium 9.3 8.4 - 10.5 mg/dL  Magnesium    Collection Time: 06/23/24  9:16 AM  Result Value Ref Range   Magnesium  1.8 1.5 - 2.5 mg/dL  Lipid panel   Collection Time: 06/23/24  9:16 AM  Result Value Ref Range   Cholesterol 199 0 - 200 mg/dL   Triglycerides 727.9 (H) 0.0 - 149.0 mg/dL   HDL 52.49 >60.99 mg/dL   VLDL 45.5 (H) 0.0 - 59.9 mg/dL   LDL Cholesterol 97 0 - 99 mg/dL   Total CHOL/HDL Ratio 4    NonHDL 151.11   Vitamin B12   Collection Time: 06/23/24  9:16 AM  Result Value Ref Range   Vitamin B-12 169 (L) 211 - 911 pg/mL  Hemoglobin A1c   Collection Time: 06/23/24  9:16 AM  Result Value Ref Range   Hgb A1c MFr Bld 5.9 4.6 - 6.5 %  VITAMIN D  25 Hydroxy (Vit-D Deficiency,  Fractures)   Collection Time: 06/23/24  9:16 AM  Result Value Ref Range   VITD 11.14 (L) 30.00 - 100.00 ng/mL    Assessment and Plan  Moderate persistent asthma with acute exacerbation Assessment & Plan: Acute, significantly improved with initiation of prednisone  taper.  She will continue her daily controller and use albuterol  as needed.   B12 deficiency -     Syringe; Use to inject Vit B12 monthly  Dispense: 20 each; Refill: 0  Acute mucoid otitis media of left ear Assessment & Plan: Acute, given persistent left ear pain increasing with significant bulging will treat for possible otitis media with azithromycin  course. Prior to plane trip recommended decongestant for several days and possible Afrin prior to plane flight. She will continue prednisone  to help with inflammation and swelling in sinuses and eustachian tube.  Return and ER precautions provided.   Other orders -     Azithromycin ; 2 tab po x 1 day then 1 tab po daily  Dispense: 6 tablet; Refill: 0    Return if symptoms worsen or fail to improve.   Greig Ring, MD

## 2024-07-09 NOTE — Assessment & Plan Note (Signed)
 Acute, given persistent left ear pain increasing with significant bulging will treat for possible otitis media with azithromycin  course. Prior to plane trip recommended decongestant for several days and possible Afrin prior to plane flight. She will continue prednisone  to help with inflammation and swelling in sinuses and eustachian tube.  Return and ER precautions provided.

## 2024-07-09 NOTE — Assessment & Plan Note (Signed)
 Acute, significantly improved with initiation of prednisone  taper.  She will continue her daily controller and use albuterol  as needed.

## 2024-07-29 ENCOUNTER — Other Ambulatory Visit: Payer: Self-pay | Admitting: Family Medicine

## 2024-10-02 ENCOUNTER — Other Ambulatory Visit: Payer: Self-pay | Admitting: Family Medicine

## 2024-10-02 DIAGNOSIS — J45909 Unspecified asthma, uncomplicated: Secondary | ICD-10-CM
